# Patient Record
Sex: Male | Born: 1935
Health system: Southern US, Community
[De-identification: ages and names within clinical notes are randomized; demographics above are authoritative.]

## PROBLEM LIST (undated history)

## (undated) DIAGNOSIS — N200 Calculus of kidney: Secondary | ICD-10-CM

## (undated) DIAGNOSIS — IMO0002 Reserved for concepts with insufficient information to code with codable children: Secondary | ICD-10-CM

## (undated) DIAGNOSIS — J309 Allergic rhinitis, unspecified: Secondary | ICD-10-CM

## (undated) DIAGNOSIS — I498 Other specified cardiac arrhythmias: Secondary | ICD-10-CM

## (undated) DIAGNOSIS — J449 Chronic obstructive pulmonary disease, unspecified: Secondary | ICD-10-CM

## (undated) DIAGNOSIS — R42 Dizziness and giddiness: Secondary | ICD-10-CM

## (undated) DIAGNOSIS — E739 Lactose intolerance, unspecified: Secondary | ICD-10-CM

## (undated) DIAGNOSIS — I4891 Unspecified atrial fibrillation: Secondary | ICD-10-CM

## (undated) DIAGNOSIS — E785 Hyperlipidemia, unspecified: Secondary | ICD-10-CM

## (undated) DIAGNOSIS — K5730 Diverticulosis of large intestine without perforation or abscess without bleeding: Secondary | ICD-10-CM

## (undated) DIAGNOSIS — Z5189 Encounter for other specified aftercare: Secondary | ICD-10-CM

## (undated) DIAGNOSIS — E119 Type 2 diabetes mellitus without complications: Secondary | ICD-10-CM

## (undated) DIAGNOSIS — N400 Enlarged prostate without lower urinary tract symptoms: Secondary | ICD-10-CM

## (undated) DIAGNOSIS — Z87898 Personal history of other specified conditions: Secondary | ICD-10-CM

## (undated) DIAGNOSIS — I442 Atrioventricular block, complete: Secondary | ICD-10-CM

## (undated) DIAGNOSIS — M25519 Pain in unspecified shoulder: Secondary | ICD-10-CM

## (undated) DIAGNOSIS — F329 Major depressive disorder, single episode, unspecified: Secondary | ICD-10-CM

## (undated) DIAGNOSIS — J189 Pneumonia, unspecified organism: Secondary | ICD-10-CM

## (undated) DIAGNOSIS — R05 Cough: Secondary | ICD-10-CM

## (undated) DIAGNOSIS — I739 Peripheral vascular disease, unspecified: Secondary | ICD-10-CM

## (undated) DIAGNOSIS — A318 Other mycobacterial infections: Secondary | ICD-10-CM

## (undated) DIAGNOSIS — I6529 Occlusion and stenosis of unspecified carotid artery: Secondary | ICD-10-CM

## (undated) DIAGNOSIS — Z86010 Personal history of colonic polyps: Secondary | ICD-10-CM

## (undated) DIAGNOSIS — I10 Essential (primary) hypertension: Secondary | ICD-10-CM

## (undated) HISTORY — DX: Allergic rhinitis, unspecified: J30.9

## (undated) HISTORY — PX: OTHER SURGICAL HISTORY: SHX169

## (undated) HISTORY — DX: Type 2 diabetes mellitus without complications: E11.9

## (undated) HISTORY — PX: APPENDECTOMY: SHX54

## (undated) HISTORY — DX: Reserved for concepts with insufficient information to code with codable children: IMO0002

## (undated) HISTORY — DX: Major depressive disorder, single episode, unspecified: F32.9

## (undated) HISTORY — DX: Encounter for other specified aftercare: Z51.89

## (undated) HISTORY — DX: Benign prostatic hyperplasia without lower urinary tract symptoms: N40.0

## (undated) HISTORY — DX: Chronic obstructive pulmonary disease, unspecified: J44.9

## (undated) HISTORY — DX: Personal history of other specified conditions: Z87.898

## (undated) HISTORY — DX: Unspecified atrial fibrillation: I48.91

## (undated) HISTORY — DX: Cough: R05

## (undated) HISTORY — DX: Pneumonia, unspecified organism: J18.9

## (undated) HISTORY — PX: HEMORRHOID BANDING: SHX5850

## (undated) HISTORY — DX: Other specified cardiac arrhythmias: I49.8

## (undated) HISTORY — DX: Occlusion and stenosis of unspecified carotid artery: I65.29

## (undated) HISTORY — DX: Hyperlipidemia, unspecified: E78.5

## (undated) HISTORY — DX: Diverticulosis of large intestine without perforation or abscess without bleeding: K57.30

## (undated) HISTORY — DX: Essential (primary) hypertension: I10

## (undated) HISTORY — DX: Lactose intolerance, unspecified: E73.9

## (undated) HISTORY — DX: Personal history of colonic polyps: Z86.010

## (undated) HISTORY — DX: Dizziness and giddiness: R42

## (undated) HISTORY — PX: COLONOSCOPY: SHX174

## (undated) HISTORY — DX: Other mycobacterial infections: A31.8

## (undated) HISTORY — PX: CARPAL TUNNEL RELEASE: SHX101

## (undated) HISTORY — DX: Pain in unspecified shoulder: M25.519

## (undated) HISTORY — DX: Peripheral vascular disease, unspecified: I73.9

---

## 2000-01-27 ENCOUNTER — Encounter (INDEPENDENT_AMBULATORY_CARE_PROVIDER_SITE_OTHER): Payer: Self-pay

## 2000-01-27 ENCOUNTER — Ambulatory Visit: Admission: RE | Admit: 2000-01-27 | Discharge: 2000-01-27 | Payer: Self-pay | Admitting: Internal Medicine

## 2000-02-14 ENCOUNTER — Ambulatory Visit (HOSPITAL_COMMUNITY): Admission: RE | Admit: 2000-02-14 | Discharge: 2000-02-14 | Payer: Self-pay | Admitting: Internal Medicine

## 2002-05-17 ENCOUNTER — Ambulatory Visit (HOSPITAL_COMMUNITY): Admission: RE | Admit: 2002-05-17 | Discharge: 2002-05-17 | Payer: Self-pay | Admitting: *Deleted

## 2002-05-17 ENCOUNTER — Encounter: Payer: Self-pay | Admitting: *Deleted

## 2004-05-17 ENCOUNTER — Ambulatory Visit: Payer: Self-pay | Admitting: Internal Medicine

## 2004-06-04 ENCOUNTER — Ambulatory Visit: Payer: Self-pay | Admitting: Gastroenterology

## 2004-06-07 ENCOUNTER — Ambulatory Visit: Payer: Self-pay | Admitting: Gastroenterology

## 2004-06-07 HISTORY — PX: ESOPHAGOGASTRODUODENOSCOPY: SHX1529

## 2004-10-05 ENCOUNTER — Ambulatory Visit: Payer: Self-pay | Admitting: Internal Medicine

## 2005-02-15 ENCOUNTER — Encounter: Admission: RE | Admit: 2005-02-15 | Discharge: 2005-02-15 | Payer: Self-pay | Admitting: Orthopedic Surgery

## 2005-02-16 ENCOUNTER — Ambulatory Visit (HOSPITAL_COMMUNITY): Admission: RE | Admit: 2005-02-16 | Discharge: 2005-02-16 | Payer: Self-pay | Admitting: Orthopedic Surgery

## 2005-02-16 ENCOUNTER — Ambulatory Visit (HOSPITAL_BASED_OUTPATIENT_CLINIC_OR_DEPARTMENT_OTHER): Admission: RE | Admit: 2005-02-16 | Discharge: 2005-02-16 | Payer: Self-pay | Admitting: Orthopedic Surgery

## 2005-03-01 ENCOUNTER — Ambulatory Visit: Payer: Self-pay | Admitting: Internal Medicine

## 2005-03-16 ENCOUNTER — Ambulatory Visit (HOSPITAL_BASED_OUTPATIENT_CLINIC_OR_DEPARTMENT_OTHER): Admission: RE | Admit: 2005-03-16 | Discharge: 2005-03-16 | Payer: Self-pay | Admitting: Orthopedic Surgery

## 2005-03-16 ENCOUNTER — Ambulatory Visit (HOSPITAL_COMMUNITY): Admission: RE | Admit: 2005-03-16 | Discharge: 2005-03-16 | Payer: Self-pay | Admitting: Orthopedic Surgery

## 2005-09-13 ENCOUNTER — Ambulatory Visit: Payer: Self-pay | Admitting: Internal Medicine

## 2005-11-11 ENCOUNTER — Ambulatory Visit: Payer: Self-pay | Admitting: Internal Medicine

## 2006-04-07 ENCOUNTER — Ambulatory Visit: Payer: Self-pay | Admitting: Internal Medicine

## 2006-04-14 ENCOUNTER — Ambulatory Visit: Payer: Self-pay

## 2006-04-14 ENCOUNTER — Ambulatory Visit: Payer: Self-pay | Admitting: Cardiovascular Disease

## 2006-04-26 ENCOUNTER — Ambulatory Visit: Payer: Self-pay | Admitting: Cardiology

## 2006-04-29 ENCOUNTER — Encounter: Admission: RE | Admit: 2006-04-29 | Discharge: 2006-04-29 | Payer: Self-pay | Admitting: Cardiology

## 2006-07-17 ENCOUNTER — Inpatient Hospital Stay (HOSPITAL_COMMUNITY): Admission: RE | Admit: 2006-07-17 | Discharge: 2006-07-18 | Payer: Self-pay | Admitting: *Deleted

## 2006-07-17 ENCOUNTER — Ambulatory Visit: Payer: Self-pay | Admitting: *Deleted

## 2006-07-17 ENCOUNTER — Encounter (INDEPENDENT_AMBULATORY_CARE_PROVIDER_SITE_OTHER): Payer: Self-pay | Admitting: Specialist

## 2006-09-28 HISTORY — PX: CAROTID ENDARTERECTOMY: SUR193

## 2007-02-06 ENCOUNTER — Ambulatory Visit: Payer: Self-pay | Admitting: Vascular Surgery

## 2007-03-27 DIAGNOSIS — I10 Essential (primary) hypertension: Secondary | ICD-10-CM | POA: Insufficient documentation

## 2007-03-27 DIAGNOSIS — I739 Peripheral vascular disease, unspecified: Secondary | ICD-10-CM

## 2007-03-27 DIAGNOSIS — E785 Hyperlipidemia, unspecified: Secondary | ICD-10-CM

## 2007-03-27 HISTORY — DX: Essential (primary) hypertension: I10

## 2007-03-27 HISTORY — DX: Hyperlipidemia, unspecified: E78.5

## 2007-03-27 HISTORY — DX: Peripheral vascular disease, unspecified: I73.9

## 2007-04-03 ENCOUNTER — Ambulatory Visit: Payer: Self-pay | Admitting: Internal Medicine

## 2007-04-04 LAB — CONVERTED CEMR LAB
Albumin: 4.2 g/dL (ref 3.5–5.2)
Basophils Absolute: 0 10*3/uL (ref 0.0–0.1)
Bilirubin Urine: NEGATIVE
Bilirubin, Direct: 0.3 mg/dL (ref 0.0–0.3)
Creatinine, Ser: 1.4 mg/dL (ref 0.4–1.5)
Eosinophils Absolute: 0.2 10*3/uL (ref 0.0–0.6)
GFR calc non Af Amer: 53 mL/min
Glucose, Bld: 113 mg/dL — ABNORMAL HIGH (ref 70–99)
Ketones, ur: NEGATIVE mg/dL
MCHC: 34.7 g/dL (ref 30.0–36.0)
MCV: 89.2 fL (ref 78.0–100.0)
Monocytes Absolute: 0.8 10*3/uL — ABNORMAL HIGH (ref 0.2–0.7)
Neutrophils Relative %: 61.2 % (ref 43.0–77.0)
Nitrite: NEGATIVE
PSA: 0.56 ng/mL (ref 0.10–4.00)
Potassium: 4.5 meq/L (ref 3.5–5.1)
Sodium: 138 meq/L (ref 135–145)
Specific Gravity, Urine: 1.025 (ref 1.000–1.03)
TSH: 2.51 microintl units/mL (ref 0.35–5.50)
Total Bilirubin: 1 mg/dL (ref 0.3–1.2)
Total CHOL/HDL Ratio: 3.9
Total Protein, Urine: NEGATIVE mg/dL
Triglycerides: 90 mg/dL (ref 0–149)
Urine Glucose: NEGATIVE mg/dL
VLDL: 18 mg/dL (ref 0–40)
WBC: 7.5 10*3/uL (ref 4.5–10.5)
pH: 5.5 (ref 5.0–8.0)

## 2007-04-09 ENCOUNTER — Ambulatory Visit: Payer: Self-pay | Admitting: Internal Medicine

## 2007-04-09 DIAGNOSIS — J309 Allergic rhinitis, unspecified: Secondary | ICD-10-CM | POA: Insufficient documentation

## 2007-04-09 DIAGNOSIS — E739 Lactose intolerance, unspecified: Secondary | ICD-10-CM | POA: Insufficient documentation

## 2007-04-09 DIAGNOSIS — F329 Major depressive disorder, single episode, unspecified: Secondary | ICD-10-CM

## 2007-04-09 DIAGNOSIS — Z87898 Personal history of other specified conditions: Secondary | ICD-10-CM

## 2007-04-09 DIAGNOSIS — F32A Depression, unspecified: Secondary | ICD-10-CM | POA: Insufficient documentation

## 2007-04-09 DIAGNOSIS — Z860100 Personal history of colon polyps, unspecified: Secondary | ICD-10-CM

## 2007-04-09 DIAGNOSIS — K5730 Diverticulosis of large intestine without perforation or abscess without bleeding: Secondary | ICD-10-CM | POA: Insufficient documentation

## 2007-04-09 DIAGNOSIS — A318 Other mycobacterial infections: Secondary | ICD-10-CM | POA: Insufficient documentation

## 2007-04-09 DIAGNOSIS — J449 Chronic obstructive pulmonary disease, unspecified: Secondary | ICD-10-CM

## 2007-04-09 DIAGNOSIS — N400 Enlarged prostate without lower urinary tract symptoms: Secondary | ICD-10-CM | POA: Insufficient documentation

## 2007-04-09 DIAGNOSIS — J4489 Other specified chronic obstructive pulmonary disease: Secondary | ICD-10-CM

## 2007-04-09 DIAGNOSIS — F3289 Other specified depressive episodes: Secondary | ICD-10-CM

## 2007-04-09 HISTORY — DX: Allergic rhinitis, unspecified: J30.9

## 2007-04-09 HISTORY — DX: Diverticulosis of large intestine without perforation or abscess without bleeding: K57.30

## 2007-04-09 HISTORY — DX: Personal history of other specified conditions: Z87.898

## 2007-04-09 HISTORY — DX: Benign prostatic hyperplasia without lower urinary tract symptoms: N40.0

## 2007-04-09 HISTORY — DX: Other mycobacterial infections: A31.8

## 2007-04-09 HISTORY — DX: Personal history of colon polyps, unspecified: Z86.0100

## 2007-04-09 HISTORY — DX: Other specified depressive episodes: F32.89

## 2007-04-09 HISTORY — DX: Major depressive disorder, single episode, unspecified: F32.9

## 2007-04-09 HISTORY — DX: Other specified chronic obstructive pulmonary disease: J44.89

## 2007-04-09 HISTORY — DX: Chronic obstructive pulmonary disease, unspecified: J44.9

## 2007-04-09 HISTORY — DX: Personal history of colonic polyps: Z86.010

## 2007-04-09 HISTORY — DX: Lactose intolerance, unspecified: E73.9

## 2007-04-24 ENCOUNTER — Ambulatory Visit: Payer: Self-pay | Admitting: Gastroenterology

## 2007-05-10 ENCOUNTER — Encounter: Payer: Self-pay | Admitting: Internal Medicine

## 2007-05-10 ENCOUNTER — Encounter: Payer: Self-pay | Admitting: Gastroenterology

## 2007-05-10 ENCOUNTER — Ambulatory Visit: Payer: Self-pay | Admitting: Gastroenterology

## 2007-05-10 LAB — HM COLONOSCOPY: HM Colonoscopy: ABNORMAL

## 2007-08-01 ENCOUNTER — Ambulatory Visit: Payer: Self-pay | Admitting: Internal Medicine

## 2007-08-01 DIAGNOSIS — M25519 Pain in unspecified shoulder: Secondary | ICD-10-CM

## 2007-08-01 HISTORY — DX: Pain in unspecified shoulder: M25.519

## 2007-08-21 ENCOUNTER — Ambulatory Visit: Payer: Self-pay | Admitting: Vascular Surgery

## 2007-10-02 ENCOUNTER — Ambulatory Visit: Payer: Self-pay | Admitting: Internal Medicine

## 2007-10-03 LAB — CONVERTED CEMR LAB
ALT: 22 units/L (ref 0–53)
AST: 26 units/L (ref 0–37)
Albumin: 4 g/dL (ref 3.5–5.2)
Alkaline Phosphatase: 68 units/L (ref 39–117)
BUN: 19 mg/dL (ref 6–23)
Bilirubin, Direct: 0.1 mg/dL (ref 0.0–0.3)
CO2: 26 meq/L (ref 19–32)
GFR calc Af Amer: 69 mL/min
Glucose, Bld: 115 mg/dL — ABNORMAL HIGH (ref 70–99)
HDL: 27.1 mg/dL — ABNORMAL LOW (ref >39.0)
Potassium: 4 meq/L (ref 3.5–5.1)
Total Protein: 7.3 g/dL (ref 6.0–8.3)

## 2007-10-09 ENCOUNTER — Ambulatory Visit: Payer: Self-pay | Admitting: Internal Medicine

## 2007-10-09 DIAGNOSIS — E119 Type 2 diabetes mellitus without complications: Secondary | ICD-10-CM | POA: Insufficient documentation

## 2007-10-09 HISTORY — DX: Type 2 diabetes mellitus without complications: E11.9

## 2007-10-29 ENCOUNTER — Telehealth: Payer: Self-pay | Admitting: Internal Medicine

## 2007-10-31 ENCOUNTER — Encounter: Payer: Self-pay | Admitting: Internal Medicine

## 2007-10-31 ENCOUNTER — Ambulatory Visit: Payer: Self-pay | Admitting: Internal Medicine

## 2007-11-08 ENCOUNTER — Ambulatory Visit: Payer: Self-pay | Admitting: Internal Medicine

## 2007-11-08 DIAGNOSIS — J189 Pneumonia, unspecified organism: Secondary | ICD-10-CM | POA: Insufficient documentation

## 2007-11-08 HISTORY — DX: Pneumonia, unspecified organism: J18.9

## 2007-12-10 ENCOUNTER — Ambulatory Visit: Payer: Self-pay | Admitting: Internal Medicine

## 2008-03-03 ENCOUNTER — Telehealth (INDEPENDENT_AMBULATORY_CARE_PROVIDER_SITE_OTHER): Payer: Self-pay | Admitting: *Deleted

## 2008-03-27 ENCOUNTER — Ambulatory Visit: Payer: Self-pay | Admitting: Internal Medicine

## 2008-03-27 DIAGNOSIS — R059 Cough, unspecified: Secondary | ICD-10-CM

## 2008-03-27 DIAGNOSIS — R05 Cough: Secondary | ICD-10-CM

## 2008-03-27 HISTORY — DX: Cough, unspecified: R05.9

## 2008-03-28 ENCOUNTER — Telehealth: Payer: Self-pay | Admitting: Internal Medicine

## 2008-04-01 ENCOUNTER — Ambulatory Visit: Payer: Self-pay | Admitting: Internal Medicine

## 2008-04-01 LAB — CONVERTED CEMR LAB
ALT: 23 units/L (ref 0–53)
AST: 22 units/L (ref 0–37)
Alkaline Phosphatase: 62 units/L (ref 39–117)
BUN: 19 mg/dL (ref 6–23)
Basophils Absolute: 0 10*3/uL (ref 0.0–0.1)
Basophils Relative: 0.5 % (ref 0.0–3.0)
CO2: 27 meq/L (ref 19–32)
Chloride: 105 meq/L (ref 96–112)
Cholesterol: 146 mg/dL (ref 0–200)
Creatinine, Ser: 1.3 mg/dL (ref 0.4–1.5)
Creatinine,U: 115.9 mg/dL
Eosinophils Absolute: 0.3 10*3/uL (ref 0.0–0.7)
Eosinophils Relative: 5 % (ref 0.0–5.0)
GFR calc non Af Amer: 57 mL/min
Hemoglobin, Urine: NEGATIVE
Hgb A1c MFr Bld: 6.5 % — ABNORMAL HIGH (ref 4.6–6.0)
LDL Cholesterol: 88 mg/dL (ref 0–99)
Leukocytes, UA: NEGATIVE
Lymphocytes Relative: 26.2 % (ref 12.0–46.0)
MCV: 90.7 fL (ref 78.0–100.0)
Neutrophils Relative %: 55.2 % (ref 43.0–77.0)
PSA: 0.52 ng/mL (ref 0.10–4.00)
Platelets: 181 10*3/uL (ref 150–400)
Potassium: 4.1 meq/L (ref 3.5–5.1)
RBC: 4.71 M/uL (ref 4.22–5.81)
Specific Gravity, Urine: 1.02 (ref 1.000–1.03)
Total Bilirubin: 0.8 mg/dL (ref 0.3–1.2)
Total CHOL/HDL Ratio: 4.5
Urobilinogen, UA: 0.2 (ref 0.0–1.0)
VLDL: 26 mg/dL (ref 0–40)
WBC: 5.2 10*3/uL (ref 4.5–10.5)

## 2008-04-07 ENCOUNTER — Ambulatory Visit: Payer: Self-pay | Admitting: Internal Medicine

## 2008-04-11 ENCOUNTER — Telehealth (INDEPENDENT_AMBULATORY_CARE_PROVIDER_SITE_OTHER): Payer: Self-pay | Admitting: *Deleted

## 2008-05-05 ENCOUNTER — Ambulatory Visit: Payer: Self-pay | Admitting: Internal Medicine

## 2008-07-22 ENCOUNTER — Telehealth: Payer: Self-pay | Admitting: Internal Medicine

## 2008-07-28 ENCOUNTER — Telehealth: Payer: Self-pay | Admitting: Internal Medicine

## 2008-08-12 ENCOUNTER — Ambulatory Visit: Payer: Self-pay | Admitting: Vascular Surgery

## 2008-10-01 ENCOUNTER — Ambulatory Visit: Payer: Self-pay | Admitting: Internal Medicine

## 2008-10-01 LAB — CONVERTED CEMR LAB
Chloride: 109 meq/L (ref 96–112)
Cholesterol: 112 mg/dL (ref 0–200)
GFR calc non Af Amer: 69.06 mL/min (ref >60)
HDL: 28.5 mg/dL — ABNORMAL LOW (ref >39.00)
LDL Cholesterol: 63 mg/dL (ref 0–99)
Potassium: 4.2 meq/L (ref 3.5–5.1)
Sodium: 141 meq/L (ref 135–145)
Triglycerides: 101 mg/dL (ref 0.0–149.0)
VLDL: 20.2 mg/dL (ref 0.0–40.0)

## 2008-10-06 ENCOUNTER — Ambulatory Visit: Payer: Self-pay | Admitting: Internal Medicine

## 2008-10-06 DIAGNOSIS — R42 Dizziness and giddiness: Secondary | ICD-10-CM

## 2008-10-06 HISTORY — DX: Dizziness and giddiness: R42

## 2008-10-16 ENCOUNTER — Encounter: Payer: Self-pay | Admitting: Internal Medicine

## 2008-10-16 ENCOUNTER — Ambulatory Visit: Payer: Self-pay

## 2009-04-01 ENCOUNTER — Ambulatory Visit: Payer: Self-pay | Admitting: Internal Medicine

## 2009-04-01 LAB — CONVERTED CEMR LAB
AST: 23 units/L (ref 0–37)
Albumin: 4.4 g/dL (ref 3.5–5.2)
Alkaline Phosphatase: 61 units/L (ref 39–117)
BUN: 16 mg/dL (ref 6–23)
Basophils Relative: 0.6 % (ref 0.0–3.0)
CO2: 28 meq/L (ref 19–32)
Calcium: 9 mg/dL (ref 8.4–10.5)
Cholesterol: 156 mg/dL (ref 0–200)
Eosinophils Relative: 5.1 % — ABNORMAL HIGH (ref 0.0–5.0)
Glucose, Bld: 122 mg/dL — ABNORMAL HIGH (ref 70–99)
HCT: 46 % (ref 39.0–52.0)
Hemoglobin: 15.7 g/dL (ref 13.0–17.0)
LDL Cholesterol: 93 mg/dL (ref 0–99)
Leukocytes, UA: NEGATIVE
Lymphs Abs: 1.4 10*3/uL (ref 0.7–4.0)
MCV: 94.2 fL (ref 78.0–100.0)
Microalb Creat Ratio: 102.3 mg/g — ABNORMAL HIGH (ref 0.0–30.0)
Monocytes Relative: 10.6 % (ref 3.0–12.0)
Neutro Abs: 3.6 10*3/uL (ref 1.4–7.7)
PSA: 0.69 ng/mL (ref 0.10–4.00)
Platelets: 159 10*3/uL (ref 150.0–400.0)
RBC: 4.88 M/uL (ref 4.22–5.81)
Sodium: 139 meq/L (ref 135–145)
Specific Gravity, Urine: 1.015 (ref 1.000–1.030)
Total CHOL/HDL Ratio: 4
Total Protein: 8.1 g/dL (ref 6.0–8.3)
Urobilinogen, UA: 0.2 (ref 0.0–1.0)
WBC: 5.9 10*3/uL (ref 4.5–10.5)

## 2009-04-08 ENCOUNTER — Ambulatory Visit: Payer: Self-pay | Admitting: Internal Medicine

## 2009-04-20 ENCOUNTER — Telehealth: Payer: Self-pay | Admitting: Internal Medicine

## 2009-05-11 ENCOUNTER — Ambulatory Visit: Payer: Self-pay | Admitting: Internal Medicine

## 2009-05-11 DIAGNOSIS — I498 Other specified cardiac arrhythmias: Secondary | ICD-10-CM | POA: Insufficient documentation

## 2009-05-11 HISTORY — DX: Other specified cardiac arrhythmias: I49.8

## 2009-05-18 ENCOUNTER — Telehealth: Payer: Self-pay | Admitting: Internal Medicine

## 2009-08-13 ENCOUNTER — Ambulatory Visit: Payer: Self-pay | Admitting: Vascular Surgery

## 2009-11-04 ENCOUNTER — Ambulatory Visit: Payer: Self-pay | Admitting: Internal Medicine

## 2009-11-04 LAB — CONVERTED CEMR LAB
Alkaline Phosphatase: 56 units/L (ref 39–117)
Bilirubin, Direct: 0.1 mg/dL (ref 0.0–0.3)
LDL Cholesterol: 84 mg/dL (ref 0–99)
Total Bilirubin: 0.6 mg/dL (ref 0.3–1.2)
Total CHOL/HDL Ratio: 4

## 2009-11-11 ENCOUNTER — Ambulatory Visit: Payer: Self-pay | Admitting: Internal Medicine

## 2010-03-17 ENCOUNTER — Ambulatory Visit: Payer: Self-pay | Admitting: Internal Medicine

## 2010-05-06 ENCOUNTER — Ambulatory Visit: Payer: Self-pay | Admitting: Internal Medicine

## 2010-05-07 LAB — CONVERTED CEMR LAB
Alkaline Phosphatase: 60 units/L (ref 39–117)
Basophils Relative: 0.3 % (ref 0.0–3.0)
Bilirubin, Direct: 0.1 mg/dL (ref 0.0–0.3)
CO2: 28 meq/L (ref 19–32)
Calcium: 8.7 mg/dL (ref 8.4–10.5)
Creatinine, Ser: 1.2 mg/dL (ref 0.4–1.5)
Creatinine,U: 212.9 mg/dL
Eosinophils Absolute: 0.2 10*3/uL (ref 0.0–0.7)
HDL: 31.2 mg/dL — ABNORMAL LOW (ref >39.00)
Hgb A1c MFr Bld: 6.7 % — ABNORMAL HIGH (ref 4.6–6.5)
LDL Cholesterol: 79 mg/dL (ref 0–99)
Lymphocytes Relative: 25.4 % (ref 12.0–46.0)
MCHC: 34.2 g/dL (ref 30.0–36.0)
Microalb Creat Ratio: 2.8 mg/g (ref 0.0–30.0)
Microalb, Ur: 6 mg/dL — ABNORMAL HIGH (ref 0.0–1.9)
Neutrophils Relative %: 58.5 % (ref 43.0–77.0)
PSA: 0.64 ng/mL (ref 0.10–4.00)
RBC: 4.67 M/uL (ref 4.22–5.81)
Specific Gravity, Urine: >=1.03 (ref 1.000–1.030)
Total Bilirubin: 0.5 mg/dL (ref 0.3–1.2)
Total CHOL/HDL Ratio: 4
Total Protein, Urine: NEGATIVE mg/dL
Total Protein: 7.4 g/dL (ref 6.0–8.3)
Triglycerides: 149 mg/dL (ref 0.0–149.0)
Urine Glucose: NEGATIVE mg/dL
WBC: 6.2 10*3/uL (ref 4.5–10.5)
pH: 5 (ref 5.0–8.0)

## 2010-05-13 ENCOUNTER — Ambulatory Visit: Payer: Self-pay | Admitting: Internal Medicine

## 2010-06-24 ENCOUNTER — Telehealth: Payer: Self-pay | Admitting: Internal Medicine

## 2010-06-29 NOTE — Assessment & Plan Note (Signed)
Summary: 6 MO ROV/NWS  #   Vital Signs:  Patient profile:   77 year old male Height:      72 inches Weight:      210 pounds BMI:     28.58 O2 Sat:      94 % on Room air Temp:     97.5 degrees F oral Pulse rate:   56 / minute BP sitting:   138 / 92  (left arm) Cuff size:   regular  Vitals Entered ByZella Ball Ewing (November 11, 2009 11:32 AM)  O2 Flow:  Room air  CC: 6 month ROV/RE   Primary Care Provider:  Willeen Cass  CC:  6 month ROV/RE.  History of Present Illness: overall doing well, no significant new complaints.  Pt denies CP, sob, doe, wheezing, orthopnea, pnd, worsening LE edema, palps, dizziness or syncope  Pt denies new neuro symptoms such as headache, facial or extremity weakness   Pt denies polydipsia, polyuria, or low sugar symptoms such as shakiness improved with eating.  Overall good compliance with meds, trying to follow low chol, DM diet, wt stable, little excercise however   Overall good compliance with meds, good tolerability.  Denies worsening depresive symtpoms or suicidal ideation.  No recent fever, wt loss, night sweats or other constitutional symtpoms.    Preventive Screening-Counseling & Management      Drug Use:  no.    Problems Prior to Update: 1)  Bradycardia  (ICD-427.89) 2)  Dizziness  (ICD-780.4) 3)  Preventive Health Care  (ICD-V70.0) 4)  Cough  (ICD-786.2) 5)  Pneumonia Organism Nos  (ICD-486) 6)  Diabetes Mellitus, Type II  (ICD-250.00) 7)  Shoulder Pain, Bilateral  (ICD-719.41) 8)  Preventive Health Care  (ICD-V70.0) 9)  Bacteremia, Mycobacterium Avium Complex  (ICD-031.2) 10)  Allergic Rhinitis  (ICD-477.9) 11)  Shingles, Hx of  (ICD-V13.8) 12)  Depression  (ICD-311) 13)  Benign Prostatic Hypertrophy  (ICD-600.00) 14)  COPD  (ICD-496) 15)  Glucose Intolerance  (ICD-271.3) 16)  Diverticulosis, Colon  (ICD-562.10) 17)  Colonic Polyps, Hx of  (ICD-V12.72) 18)  Special Screening Malignant Neoplasm of Prostate  (ICD-V76.44) 19)  Peripheral  Vascular Disease  (ICD-443.9) 20)  Hypertension  (ICD-401.9) 21)  Hyperlipidemia  (ICD-272.4)  Medications Prior to Update: 1)  Omeprazole 20 Mg  Cpdr (Omeprazole) .Marland Kitchen.. 1 By Mouth Two Times A Day 2)  Ecotrin 325 Mg  Tbec (Aspirin) .Marland Kitchen.. 1po Qd 3)  Metamucil .... 2 Caps.qd 4)  Co Q10 .Marland Kitchen.. 200 Mg 1po Qd 5)  Crestor 20 Mg  Tabs (Rosuvastatin Calcium) .Marland Kitchen.. 1 By Mouth Qhs 6)  Amlodipine Besylate 10 Mg Tabs (Amlodipine Besylate) .Marland Kitchen.. 1 Tab By Mouth Daily 7)  Losartan Potassium-Hctz 100-25 Mg Tabs (Losartan Potassium-Hctz) .Marland Kitchen.. 1 By Mouth Once Daily 8)  Toprol Xl 50 Mg Xr24h-Tab (Metoprolol Succinate) .Marland Kitchen.. 1 By Mouth Once Daily  Current Medications (verified): 1)  Omeprazole 20 Mg  Cpdr (Omeprazole) .Marland Kitchen.. 1 By Mouth Two Times A Day 2)  Ecotrin 325 Mg  Tbec (Aspirin) .Marland Kitchen.. 1po Qd 3)  Metamucil .... 2 Caps.qd 4)  Co Q10 .Marland Kitchen.. 200 Mg 1po Qd 5)  Crestor 20 Mg  Tabs (Rosuvastatin Calcium) .Marland Kitchen.. 1 By Mouth Qhs 6)  Amlodipine Besylate 10 Mg Tabs (Amlodipine Besylate) .Marland Kitchen.. 1 Tab By Mouth Daily 7)  Losartan Potassium-Hctz 100-25 Mg Tabs (Losartan Potassium-Hctz) .Marland Kitchen.. 1 By Mouth Once Daily 8)  Toprol Xl 50 Mg Xr24h-Tab (Metoprolol Succinate) .Marland Kitchen.. 1 By Mouth Once Daily  Allergies (verified): 1)  Ace Inhibitors  Past History:  Past Medical History: Last updated: 10/06/2008 Hyperlipidemia Hypertension Peripheral vascular disease Colonic polyps, hx of Diverticulosis, colon Benign prostatic hypertrophy Depression hx of shingles Allergic rhinitis hx of pos pulm MAI 1998 Diabetes mellitus, type II - diet COUGH  onset 6/09.....................................................................Marland KitchenWert    - h/o ACE intol    - D/c cozaar and amlodipine 03/27/08 trial basis    - resumed April 11, 2008 no improvement reported off cozaar/amlodipine Diabetes mellitus, type II  Past Surgical History: Last updated: 04/09/2007 EGD-06/07/2004 Colonoscopy-02/17/2004 Carotid endarterectomy - left -  5/08 Appendectomy s/p PUD surgury - ?partial gastrectomy Carpal tunnel release - bilat  Social History: Last updated: 11/11/2009 Former Smoker quit 2/01 Alcohol use-yes 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   Head:  normocephalic and atraumatic.   Eyes:  vision grossly intact, pupils equal, and pupils round.   Ears:  R ear normal and L ear normal.   Nose:  no external deformity and no nasal discharge.   Mouth:  no gingival abnormalities and pharynx pink and moist.   Neck:  supple and no masses.   Lungs:  normal respiratory effort and normal breath sounds.   Heart:  normal rate and regular rhythm.   Extremities:  no edema, no erythema  Psych:  not anxious appearing and not depressed appearing.     Impression & Recommendations:  Problem # 1:  DIABETES MELLITUS, TYPE II (ICD-250.00)  His updated medication list for this problem includes:    Ecotrin 325 Mg Tbec (Aspirin) .Marland Kitchen... 1po qd    Losartan Potassium-hctz 100-25 Mg Tabs (Losartan potassium-hctz) .Marland Kitchen... 1 by mouth once daily  Labs Reviewed: Creat: 1.3 (04/01/2009)    Reviewed HgBA1c results: 6.6 (11/04/2009)  6.2 (04/01/2009) stable overall by hx and exam, ok to continue meds/tx as is  - no OHA needed at this time;  Pt to cont DM diet, excercise, wt loss efforts  Problem # 2:  HYPERTENSION (ICD-401.9)  His updated medication list for this problem includes:    Amlodipine Besylate 10 Mg Tabs (Amlodipine besylate) .Marland Kitchen... 1 tab by mouth daily    Losartan Potassium-hctz 100-25 Mg Tabs (Losartan  potassium-hctz) .Marland Kitchen... 1 by mouth once daily    Toprol Xl 50 Mg Xr24h-tab (Metoprolol succinate) .Marland Kitchen... 1 by mouth once daily  BP today: 138/92 Prior BP: 152/82 (05/11/2009)  Labs Reviewed: K+: 4.3 (04/01/2009) Creat: : 1.3 (04/01/2009)   Chol: 134 (11/04/2009)   HDL: 33.20 (11/04/2009)   LDL: 84 (11/04/2009)   TG: 86.0 (11/04/2009) stable overall by hx and exam, ok to continue meds/tx as is   Problem # 3:  HYPERLIPIDEMIA (ICD-272.4)  His updated medication list for this problem includes:    Crestor 20 Mg Tabs (Rosuvastatin calcium) .Marland Kitchen... 1 by mouth qhs  Labs Reviewed: SGOT: 26 (11/04/2009)   SGPT: 23 (11/04/2009)   HDL:33.20 (11/04/2009), 35.70 (04/01/2009)  LDL:84 (11/04/2009), 93 (04/01/2009)  Chol:134 (11/04/2009), 156 (04/01/2009)  Trig:86.0 (11/04/2009), 139.0 (04/01/2009) stable overall by hx and exam, ok to continue meds/tx as is   Problem # 4:  DEPRESSION (ICD-311) stable overall by  hx and exam, ok to continue meds/tx as is  - none needed at this time  Complete Medication List: 1)  Omeprazole 20 Mg Cpdr (Omeprazole) .Marland Kitchen.. 1 by mouth two times a day 2)  Ecotrin 325 Mg Tbec (Aspirin) .Marland Kitchen.. 1po qd 3)  Metamucil  .... 2 caps.qd 4)  Co Q10  .Marland Kitchen.. 200 mg 1po qd 5)  Crestor 20 Mg Tabs (Rosuvastatin calcium) .Marland Kitchen.. 1 by mouth qhs 6)  Amlodipine Besylate 10 Mg Tabs (Amlodipine besylate) .Marland Kitchen.. 1 tab by mouth daily 7)  Losartan Potassium-hctz 100-25 Mg Tabs (Losartan potassium-hctz) .Marland Kitchen.. 1 by mouth once daily 8)  Toprol Xl 50 Mg Xr24h-tab (Metoprolol succinate) .Marland Kitchen.. 1 by mouth once daily  Patient Instructions: 1)  Continue all previous medications as before this visit  2)  Please schedule a follow-up appointment in 6 months with CPX labs and: 3)  HbgA1C prior to visit, ICD-9: 250.02 4)  Urine Microalbumin prior to visit, ICD-9:

## 2010-06-29 NOTE — Assessment & Plan Note (Signed)
Summary: flu shot-lb   Nurse Visit   Allergies: 1)  Ace Inhibitors  Orders Added: 1)  Flu Vaccine 37yrs + MEDICARE PATIENTS [Q2039] 2)  Administration Flu vaccine - MCR [G0008] Flu Vaccine Consent Questions     Do you have a history of severe allergic reactions to this vaccine? no    Any prior history of allergic reactions to egg and/or gelatin? no    Do you have a sensitivity to the preservative Thimersol? no    Do you have a past history of Guillan-Barre Syndrome? no    Do you currently have an acute febrile illness? no    Have you ever had a severe reaction to latex? no    Vaccine information given and explained to patient? yes    Are you currently pregnant? no    Lot Number:[id number]   Exp Date:11/27/2010   Site Given  Left Deltoid IM Lanier Prude, [facility])  March 17, 2010 9:57 AM

## 2010-07-01 NOTE — Progress Notes (Signed)
Summary: Pharmacy change  Phone Note Refill Request Message from:  Patient on June 24, 2010 1:44 PM  Refills Requested: Medication #1:  OMEPRAZOLE 20 MG  CPDR 1 by mouth two times a day   Dosage confirmed as above?Dosage Confirmed   Supply Requested: 6 months  Medication #2:  CRESTOR 20 MG  TABS 1 by mouth qhs   Dosage confirmed as above?Dosage Confirmed   Supply Requested: 6 months  Medication #3:  AMLODIPINE BESYLATE 10 MG TABS 1 TAB BY MOUTH DAILY   Dosage confirmed as above?Dosage Confirmed   Supply Requested: 6 months  Medication #4:  LOSARTAN POTASSIUM-HCTZ 100-25 MG TABS 1 by mouth once daily   Dosage confirmed as above?Dosage Confirmed   Supply Requested: 6 months Pt changed pharmacies from [facility] to [facility]   Method Requested: Electronic Initial call taken by: Margaret Pyle, CMA,  June 24, 2010 1:45 PM    Prescriptions: LOSARTAN POTASSIUM-HCTZ 100-25 MG TABS (LOSARTAN POTASSIUM-HCTZ) 1 by mouth once daily  #90 x 1   Entered by:   Margaret Pyle, CMA   Authorized by:   [name] MD   Signed by:   Margaret Pyle, CMA on 06/24/2010   Method used:   Electronically to        [facility]* (retail)       [address] [place], [place]  [address]       Ph: [phone]       Fax: [phone]   RxID:   [id number] TOPROL XL 50 MG XR24H-TAB (METOPROLOL SUCCINATE) 1 by mouth once daily  #90 x 1   Entered by:   Margaret Pyle, CMA   Authorized by:   [name] MD   Signed by:   Margaret Pyle, CMA on 06/24/2010   Method used:   Electronically to        [facility]* (retail)       [address] [place], [place]  [address]       Ph: [phone]       Fax: [phone]   RxID:   [id number] AMLODIPINE BESYLATE 10 MG TABS (AMLODIPINE BESYLATE) 1 TAB BY MOUTH DAILY  #90 x 1   Entered by:   Margaret Pyle, CMA  Authorized by:   [name] MD   Signed by:   Margaret Pyle, CMA on 06/24/2010   Method used:   Electronically to        [facility]* (retail)       [address] [place], [place]  [address]       Ph: [phone]       Fax: [phone]   RxID:   [id number] CRESTOR 20 MG  TABS (ROSUVASTATIN CALCIUM) 1 by mouth qhs  #90 x 1   Entered by:   Margaret Pyle, CMA   Authorized by:   [name] MD   Signed by:   Margaret Pyle, CMA on 06/24/2010   Method used:   Electronically to        [facility]* (retail)       [address] [place], [place]  [address]  Ph: [phone]       Fax: [phone]   RxID:   [id number] OMEPRAZOLE 20 MG  CPDR (OMEPRAZOLE) 1 by mouth two times a day  #180 x 1   Entered by:   Margaret Pyle, CMA   Authorized by:   [name] MD   Signed by:   Margaret Pyle, CMA on 06/24/2010   Method used:   Electronically to        [facility]* (retail)       [address] [place], [place]  [address]       Ph: [phone]       Fax: [phone]   RxID:   [id number]

## 2010-07-01 NOTE — Assessment & Plan Note (Signed)
Summary: 6 mos f/u #/cd   Vital Signs:  Patient profile:   77 year old male Height:      73 inches Weight:      211.13 pounds BMI:     27.96 O2 Sat:      94 % on Room air Temp:     97.4 degrees F oral Pulse rate:   65 / minute BP sitting:   120 / 70  (left arm) Cuff size:   large  Vitals Entered By: Zella Ball Ewing CMA (AAMA) (May 13, 2010 11:11 AM)  O2 Flow:  Room air  CC: 6  month ROV/RE   Primary Care Provider:  Willeen Cass  CC:  6  month ROV/RE.  History of Present Illness: here for wellness and f/u;  overall doing well;  Pt denies CP, worsening sob, doe, wheezing, orthopnea, pnd, worsening LE edema, palps, dizziness or syncope  Pt denies new neuro symptoms such as headache, facial or extremity weakness  Pt denies polydipsia, polyuria, or low sugar symptoms such as shakiness improved with eating.  Overall good compliance with meds, trying to follow low chol, DM diet, wt stable, little excercise however  Denies worsening depressive symptoms, suicidal ideation, or panic.   Overall good compliance with meds, and good tolerability.  No fever, wt loss, night sweats, loss of appetite or other constitutional symptoms  Pt states good ability with ADL's, low fall risk, home safety reviewed and adequate, no significant change in hearing or vision, trying to follow lower chol diet, and occasionally active only with regular excercise.   Problems Prior to Update: 1)  Bradycardia  (ICD-427.89) 2)  Dizziness  (ICD-780.4) 3)  Preventive Health Care  (ICD-V70.0) 4)  Cough  (ICD-786.2) 5)  Pneumonia Organism Nos  (ICD-486) 6)  Diabetes Mellitus, Type II  (ICD-250.00) 7)  Shoulder Pain, Bilateral  (ICD-719.41) 8)  Preventive Health Care  (ICD-V70.0) 9)  Bacteremia, Mycobacterium Avium Complex  (ICD-031.2) 10)  Allergic Rhinitis  (ICD-477.9) 11)  Shingles, Hx of  (ICD-V13.8) 12)  Depression  (ICD-311) 13)  Benign Prostatic Hypertrophy  (ICD-600.00) 14)  COPD  (ICD-496) 15)  Glucose  Intolerance  (ICD-271.3) 16)  Diverticulosis, Colon  (ICD-562.10) 17)  Colonic Polyps, Hx of  (ICD-V12.72) 18)  Special Screening Malignant Neoplasm of Prostate  (ICD-V76.44) 19)  Peripheral Vascular Disease  (ICD-443.9) 20)  Hypertension  (ICD-401.9) 21)  Hyperlipidemia  (ICD-272.4)  Medications Prior to Update: 1)  Omeprazole 20 Mg  Cpdr (Omeprazole) .Marland Kitchen.. 1 By Mouth Two Times A Day 2)  Ecotrin 325 Mg  Tbec (Aspirin) .Marland Kitchen.. 1po Qd 3)  Metamucil .... 2 Caps.qd 4)  Co Q10 .Marland Kitchen.. 200 Mg 1po Qd 5)  Crestor 20 Mg  Tabs (Rosuvastatin Calcium) .Marland Kitchen.. 1 By Mouth Qhs 6)  Amlodipine Besylate 10 Mg Tabs (Amlodipine Besylate) .Marland Kitchen.. 1 Tab By Mouth Daily 7)  Losartan Potassium-Hctz 100-25 Mg Tabs (Losartan Potassium-Hctz) .Marland Kitchen.. 1 By Mouth Once Daily 8)  Toprol Xl 50 Mg Xr24h-Tab (Metoprolol Succinate) .Marland Kitchen.. 1 By Mouth Once Daily  Current Medications (verified): 1)  Omeprazole 20 Mg  Cpdr (Omeprazole) .Marland Kitchen.. 1 By Mouth Two Times A Day 2)  Ecotrin 325 Mg  Tbec (Aspirin) .Marland Kitchen.. 1po Qd 3)  Metamucil .... 2 Caps.qd 4)  Co Q10 .Marland Kitchen.. 200 Mg 1po Qd 5)  Crestor 20 Mg  Tabs (Rosuvastatin Calcium) .Marland Kitchen.. 1 By Mouth Qhs 6)  Amlodipine Besylate 10 Mg Tabs (Amlodipine Besylate) .Marland Kitchen.. 1 Tab By Mouth Daily 7)  Losartan Potassium-Hctz 100-25 Mg Tabs (Losartan  Potassium-Hctz) .Marland Kitchen.. 1 By Mouth Once Daily 8)  Toprol Xl 50 Mg Xr24h-Tab (Metoprolol Succinate) .Marland Kitchen.. 1 By Mouth Once Daily  Allergies (verified): 1)  Ace Inhibitors  Past History:  Past Medical History: Last updated: 10/06/2008 Hyperlipidemia Hypertension Peripheral vascular disease Colonic polyps, hx of Diverticulosis, colon Benign prostatic hypertrophy Depression hx of shingles Allergic rhinitis hx of pos pulm MAI 1998 Diabetes mellitus, type II - diet COUGH  onset 6/09.....................................................................Marland KitchenWert    - h/o ACE intol    - D/c cozaar and amlodipine 03/27/08 trial basis    - resumed April 11, 2008 no improvement  reported off cozaar/amlodipine Diabetes mellitus, type II  Past Surgical History: Last updated: 04/09/2007 EGD-06/07/2004 Colonoscopy-02/17/2004 Carotid endarterectomy - left - 5/08 Appendectomy s/p PUD surgury - ?partial gastrectomy Carpal tunnel release - bilat  Family History: Last updated: 04/09/2007 Family History of Cardiovascular disorder Family History of Congestive Heart Failure Father with PUD Family History of Stroke F 1st degree relative Family History of Stroke M 1st degree relative  Social History: Last updated: 11/11/2009 Former Smoker quit 2/01 Alcohol use-yes 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         all otherwise negative per pt -    Physical Exam  General:  alert and overweight-appearing.   Head:  normocephalic and atraumatic.   Eyes:  vision grossly intact, pupils equal, and pupils round.   Ears:  R ear normal and L ear normal.   Nose:  no external deformity and no nasal discharge.   Mouth:  no gingival abnormalities and pharynx pink and moist.   Neck:  supple and no masses.   Lungs:  normal respiratory effort and normal breath sounds.   Heart:  normal rate and regular rhythm.   Abdomen:  soft, non-tender, and normal bowel sounds.   Msk:  no joint tenderness and no joint swelling.   Extremities:  no edema, no erythema  Neurologic:  cranial nerves II-XII intact and strength normal in all extremities.      Impression & Recommendations:  Problem # 1:  Preventive Health Care (ICD-V70.0) Overall doing well, age appropriate education and counseling updated, referral for preventive services and immunizations addressed, dietary counseling and smoking status adressed , most recent labs reviewed I have personally reviewed and have noted 1.The patient's medical and social history 2.Their use of alcohol, tobacco or illicit drugs 3.Their current medications and supplements 4. Functional ability including ADL's, fall risk, home safety risk, hearing & visual impairment  5.Diet and physical activities 6.Evidence for depression or mood disorders The patients weight, height, BMI  have been recorded in the chart I have made referrals, counseling and provided education to the patient based review of the above   Problem # 2:  DIABETES MELLITUS, TYPE II (ICD-250.00)  His updated medication list for this problem includes:    Ecotrin 325 Mg Tbec (Aspirin) .Marland Kitchen... 1po qd    Losartan Potassium-hctz 100-25 Mg Tabs (Losartan potassium-hctz) .Marland Kitchen... 1 by mouth once daily  Labs Reviewed: Creat: 1.2 (05/07/2010)    Reviewed HgBA1c results: 6.7 (05/07/2010)  6.6 (11/04/2009) stable overall by hx and exam, ok  to continue meds/tx as is   Problem # 3:  HYPERTENSION (ICD-401.9)  His updated medication list for this problem includes:    Amlodipine Besylate 10 Mg Tabs (Amlodipine besylate) .Marland Kitchen... 1 tab by mouth daily    Losartan Potassium-hctz 100-25 Mg Tabs (Losartan potassium-hctz) .Marland Kitchen... 1 by mouth once daily    Toprol Xl 50 Mg Xr24h-tab (Metoprolol succinate) .Marland Kitchen... 1 by mouth once daily  BP today: 120/70 Prior BP: 138/92 (11/11/2009)  Labs Reviewed: K+: 4.3 (05/07/2010) Creat: : 1.2 (05/07/2010)   Chol: 140 (05/07/2010)   HDL: 31.20 (05/07/2010)   LDL: 79 (05/07/2010)   TG: 149.0 (05/07/2010) stable overall by hx and exam, ok to continue meds/tx as is   Complete Medication List: 1)  Omeprazole 20 Mg Cpdr  (Omeprazole) .Marland Kitchen.. 1 by mouth two times a day 2)  Ecotrin 325 Mg Tbec (Aspirin) .Marland Kitchen.. 1po qd 3)  Metamucil  .... 2 caps.qd 4)  Co Q10  .Marland Kitchen.. 200 mg 1po qd 5)  Crestor 20 Mg Tabs (Rosuvastatin calcium) .Marland Kitchen.. 1 by mouth qhs 6)  Amlodipine Besylate 10 Mg Tabs (Amlodipine besylate) .Marland Kitchen.. 1 tab by mouth daily 7)  Losartan Potassium-hctz 100-25 Mg Tabs (Losartan potassium-hctz) .Marland Kitchen.. 1 by mouth once daily 8)  Toprol Xl 50 Mg Xr24h-tab (Metoprolol succinate) .Marland Kitchen.. 1 by mouth once daily  Other Orders: Pneumococcal Vaccine (04540) Admin 1st Vaccine (98119)  Patient Instructions: 1)  you had the pneumonia shot today 2)  Continue all previous medications as before this visit 3)  Please call for your appt with Dr Darrick Penna, vascular surgeon 4)  Please schedule a follow-up appointment in 6 months with: 5)  BMP prior to visit, ICD-9: 250.02 6)  Lipid Panel prior to visit, ICD-9: 7)  HbgA1C prior to visit, ICD-9:   Orders Added: 1)  Pneumococcal Vaccine [90732] 2)  Admin 1st Vaccine [90471] 3)  Est. Patient 65& > [14782]   Immunizations Administered:  Pneumonia Vaccine:    Vaccine Type: Pneumovax    Site: left deltoid    Mfr: Merck    Dose: 0.5 ml    Route: IM    Given by: Zella Ball Ewing CMA (AAMA)    Exp. Date: 09/24/2011    Lot #: [id number]    VIS given: 05/04/09 version given May 13, 2010.   Immunizations Administered:  Pneumonia Vaccine:    Vaccine Type: Pneumovax    Site: left deltoid    Mfr: Merck    Dose: 0.5 ml    Route: IM    Given by: Zella Ball Ewing CMA (AAMA)    Exp. Date: 09/24/2011    Lot #: [id number]    VIS given: 05/04/09 version given May 13, 2010.

## 2010-08-25 ENCOUNTER — Other Ambulatory Visit: Payer: Self-pay

## 2010-08-25 ENCOUNTER — Ambulatory Visit: Payer: Self-pay | Admitting: Vascular Surgery

## 2010-08-26 ENCOUNTER — Other Ambulatory Visit (INDEPENDENT_AMBULATORY_CARE_PROVIDER_SITE_OTHER): Payer: Medicare Other

## 2010-08-26 ENCOUNTER — Encounter (INDEPENDENT_AMBULATORY_CARE_PROVIDER_SITE_OTHER): Payer: Medicare Other | Admitting: Vascular Surgery

## 2010-08-26 DIAGNOSIS — I6529 Occlusion and stenosis of unspecified carotid artery: Secondary | ICD-10-CM

## 2010-08-26 DIAGNOSIS — Z48812 Encounter for surgical aftercare following surgery on the circulatory system: Secondary | ICD-10-CM

## 2010-08-27 NOTE — Assessment & Plan Note (Signed)
OFFICE VISIT  AUL, MANGIERI DOB:  22-Feb-1930                                       08/26/2010 UEAVW#:[id number]  The patient returns for followup today.  He previously underwent a left carotid endarterectomy by Dr. Madilyn Fireman in February of 2008.  He denies any symptoms currently of TIA, amaurosis or stroke.  He does get some occasional dizziness but this primarily seems to be postural in nature and mainly occurs when he rolls over in bed quickly.  CHRONIC MEDICAL PROBLEMS:  Remain hypertension, elevated cholesterol, COPD.  These are all currently controlled and followed by Dr. Jonny Ruiz.  MEDICATIONS:  Include aspirin 325 mg once a day, amlodipine, Cozaar, omeprazole, Crestor, Metamucil, CoQ10, metoprolol.  PAST MEDICAL HISTORY:  Otherwise unremarkable.  SOCIAL HISTORY:  He is married, has 2 children.  He is retired.  He is a former smoker.  He does not drink alcohol regularly.  FAMILY HISTORY:  Not remarkable for early vascular disease.  REVIEW OF SYSTEMS:  Today he has some occasional pain in his legs when walking.  He has some occasional dizziness as mentioned above. Otherwise review of systems is negative.  ALLERGIES:  He has no known drug allergies.  PHYSICAL EXAM:  Vital signs:  Blood pressure is 152/70 in the right arm, 133/79 in the left arm, respirations are 20, heart rate 64 and regular. Neck:  Has no carotid bruits.  Chest:  Clear to auscultation.  Cardiac: Regular rate and rhythm without murmur.  Upper extremity and lower extremity motor strength is 5/5 and symmetric.  He had a repeat carotid duplex exam today which showed less than 40% right internal carotid artery stenosis and widely patent left internal carotid artery.  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  Overall the patient is doing well.  He has had no recurrent stenosis. He will follow up in 1 year's time for repeat carotid duplex exam to make  sure he is not developing worsening stenosis on the right side or recurrent stenosis in the left.    Janetta Hora. Jariah Jarmon, MD Electronically Signed  CEF/MEDQ  D:  08/26/2010  T:  08/27/2010  Job:  [id number]  cc:   [name], MD

## 2010-09-06 NOTE — Procedures (Unsigned)
CAROTID DUPLEX EXAM  INDICATION:  Carotid disease  HISTORY: Diabetes:  no Cardiac:  no Hypertension:  yes Smoking:  Previous Previous Surgery:  Left carotid endarterectomy 07/18/1999 by Dr. Madilyn Fireman CV History:  Currently asymptomatic Amaurosis Fugax No, Paresthesias No, Hemiparesis No                                      RIGHT             LEFT Brachial systolic pressure:         170               152 Brachial Doppler waveforms:         normal            monophasic Vertebral direction of flow:        Antegrade         Bidirectional DUPLEX VELOCITIES (cm/sec) CCA peak systolic                   80                67 ECA peak systolic                   214               69 ICA peak systolic                   78                71 ICA end diastolic                   18                12 PLAQUE MORPHOLOGY:                  calcific PLAQUE AMOUNT:                      Minimal           None PLAQUE LOCATION:                    Bifurcation, ECA  IMPRESSION: 1. Right internal carotid artery velocity suggests 1% to 39% stenosis. 2. Patent left carotid endarterectomy site with no evidence of     restenosis of the internal carotid artery. 3. Bidirectional left vertebral artery. 4. Stable from previous study.  ___________________________________________ Janetta Hora Darrick Penna, MD  EM/MEDQ  D:  08/26/2010  T:  08/26/2010  Job:  [id number]

## 2010-10-12 NOTE — Procedures (Signed)
CAROTID DUPLEX EXAM   INDICATION:  Follow up carotid artery disease.   HISTORY:  Diabetes:  No  Cardiac:  No  Hypertension:  Yes  Smoking:  No  Previous Surgery:  Left carotid endarterectomy with DPA on 07/17/2006 by  Dr. Madilyn Fireman  CV History:  No  Amaurosis Fugax No, Paresthesias No, Hemiparesis No                                       RIGHT             LEFT  Brachial systolic pressure:         160               160  Brachial Doppler waveforms:         Triphasic         Triphasic  Vertebral direction of flow:        Antegrade         Atypical  DUPLEX VELOCITIES (cm/sec)  CCA peak systolic                   98                114  ECA peak systolic                   201               168  ICA peak systolic                   79                102  ICA end diastolic                   22                17  PLAQUE MORPHOLOGY:                  Calcified with shadowing            Mixed  PLAQUE AMOUNT:                      Moderate-to-severe                  Mild-to-moderate  PLAQUE LOCATION:                    ICA, ECA          ECA   IMPRESSION:  1. Bilateral external carotid artery stenosis.  2. Internal carotid artery stenosis 20-39%; however, calcified plaque      with shadowing on the right could obscure a more severe internal      carotid artery stenosis.  3. No left internal carotid artery stenosis, status post      endarterectomy.   ___________________________________________  P. Liliane Bade, M.D.   DP/MEDQ  D:  02/06/2007  T:  02/06/2007  Job:  [id number]

## 2010-10-12 NOTE — Procedures (Signed)
CAROTID DUPLEX EXAM   INDICATION:  Follow up carotid artery disease.   HISTORY:  Diabetes:  No.  Cardiac:  No.  Hypertension:  Yes.  Smoking:  Previous Surgery:  Left carotid endarterectomy with DPA on 07/17/06 by  Dr. Madilyn Fireman.  CV History:  Amaurosis Fugax No, Paresthesias No, Hemiparesis No.                                       RIGHT             LEFT  Brachial systolic pressure:         150               144  Brachial Doppler waveforms:         WNL               WNL  Vertebral direction of flow:        Antegrade         Antegrade  DUPLEX VELOCITIES (cm/sec)  CCA peak systolic                   111               123  ECA peak systolic                   238               129  ICA peak systolic                   135               113  ICA end diastolic                   31                33  PLAQUE MORPHOLOGY:                  Calcified         None  PLAQUE AMOUNT:                      Moderate          None  PLAQUE LOCATION:                    BIF, ICA, ECA     None   IMPRESSION:  1. 30-49% right internal carotid artery stenosis.  2. Patent left internal carotid artery with no evidence of restenosis.       ___________________________________________  Di Kindle. Edilia Bo, M.D.   AC/MEDQ  D:  08/12/2008  T:  08/13/2008  Job:  [id number]

## 2010-10-12 NOTE — Procedures (Signed)
CAROTID DUPLEX EXAM   INDICATION:  Carotid disease.   HISTORY:  Diabetes:  No.  Cardiac:  No.  Hypertension:  Yes.  Smoking:  No.  Previous Surgery:  Left carotid endarterectomy on 07/18/1999 by Dr.  Madilyn Fireman.  CV History:  Currently asymptomatic.  Amaurosis Fugax No, Paresthesias No, Hemiparesis No.                                       RIGHT             LEFT  Brachial systolic pressure:         178               156  Brachial Doppler waveforms:         Normal            Monophasic  Vertebral direction of flow:        Antegrade         Bidirectional  DUPLEX VELOCITIES (cm/sec)  CCA peak systolic                   88                87  ECA peak systolic                   166               112  ICA peak systolic                   87                76  ICA end diastolic                   16                15  PLAQUE MORPHOLOGY:                  Mixed  PLAQUE AMOUNT:                      Mild              None  PLAQUE LOCATION:                    ICA/ECA   IMPRESSION:  1. 1-39% stenosis of the right internal carotid artery.  2. Patent left carotid endarterectomy site with no left internal      carotid artery stenosis.  3. Bidirectional left vertebral artery flow with a significant      difference in the bilateral brachial pressures.  The velocity of      the left proximal subclavian artery was 186 cm/s.  4. Velocities of the right internal carotid artery are less than      previously recorded when compared to the previous examination on      08/12/2008 with the left internal carotid artery remaining stable.   ___________________________________________  Di Kindle. Edilia Bo, M.D.   CH/MEDQ  D:  08/14/2009  T:  08/14/2009  Job:  [id number]

## 2010-10-12 NOTE — Procedures (Signed)
CAROTID DUPLEX EXAM   INDICATION:  Follow up of known carotid artery disease.  Patient is  asymptomatic.   HISTORY:  Diabetes:  No.  Cardiac:  No.  Hypertension:  Yes.  Smoking:  No.  Previous Surgery:  Left CEA with DPA on 07/17/2006 by Dr. Madilyn Fireman.  CV History:  Amaurosis Fugax No, Paresthesias No, Hemiparesis No                                       RIGHT             LEFT  Brachial systolic pressure:         156               150  Brachial Doppler waveforms:         WNL               WNL  Vertebral direction of flow:        Antegrade         Antegrade  DUPLEX VELOCITIES (cm/sec)  CCA peak systolic                   94                90  ECA peak systolic                   228               224  ICA peak systolic                   129               81  ICA end diastolic                   29                16  PLAQUE MORPHOLOGY:                  Calcific with shadowing             N/A  PLAQUE AMOUNT:                      Moderate          N/A  PLAQUE LOCATION:                    Bifurcation, ICA, ECA               ECA   IMPRESSION:  1. Right 40-59% ICA stenosis.  2. No recurrent stenosis status post left CEA.  3. Bilateral ECA stenoses.  4. Bilateral antegrade flow in vertebral arteries, however, left      vertebral artery demonstrates early systolic deceleration      consistent with subclavian stenosis.   ___________________________________________  P. Liliane Bade, M.D.   PB/MEDQ  D:  08/21/2007  T:  08/21/2007  Job:  [id number]

## 2010-10-15 NOTE — Op Note (Signed)
NAMEKITT, LEDET               ACCOUNT NO.:  [id number]   MEDICAL RECORD NO.:  [id number]          PATIENT TYPE:  INP   LOCATION:  2550                         FACILITY:  [facility]   PHYSICIAN:  Balinda Quails, M.D.    DATE OF BIRTH:  April 21, 1930   DATE OF PROCEDURE:  07/17/2006  DATE OF DISCHARGE:                               OPERATIVE REPORT   SURGEON:  Denman George, MD   ASSISTANT:  Constance Holster, PA.   ANESTHETIC:  General endotracheal.   ANESTHESIOLOGIST:  Edwards.   PREOPERATIVE DIAGNOSIS:  Severe left internal carotid artery stenosis.   POSTOPERATIVE DIAGNOSIS:  Severe left internal carotid artery stenosis.   PROCEDURE:  Left carotid endarterectomy, Dacron patch angioplasty.   CLINICAL NOTE:  Daniel Woodard is a 77 year old male seen in the office  with severe left internal carotid artery stenosis by Doppler evaluation.  He underwent MR angiography which verified tandem lesions in the carotid  bulb and proximal left internal carotid artery which were severe.  He is  brought to the operating at this time for left carotid endarterectomy.   INDICATIONS FOR OPERATION:  Reduction of stroke risk.  Potential  complications include MI, CVA, cranial nerve injury and death.   OPERATIVE PROCEDURE:  The patient brought to the operating room in  stable hemodynamic condition.  Placed under general endotracheal  anesthesia.  Foley catheter and arterial line in place.  Left neck  prepped and draped in sterile fashion.   Curvilinear skin incision made along the upper border of the left  sternomastoid muscle.  Dissection carried through the subcutaneous  tissue electrocautery.  Deep dissection carried down through the  platysma.  Further dissection carried along the anterior border of the  sternomastoid to the carotid sheath.  Facial vein ligated with 2-0 silk  and divided.  The common carotid artery mobilized down to the omohyoid  muscle and encircled with vessel loop.  The  superior thyroid external  carotid were freed and encircled with vessel loop.  The internal carotid  artery followed distally up to the posterior belly of the digastric  muscle and encircled with vessel loop.  The hypoglossal nerve identified  distally and preserved.  The vagus nerve reflected posteriorly and  preserved.   The patient administered 7000 units heparin intravenously.  Carotid  vessels controlled with clamps.  Longitudinal arteriotomy made in the  distal common carotid artery.  The arteriotomy extended across carotid  bulb and up into the internal carotid artery.  There was a heavily  calcified plaque at the carotid bulb and origin of left internal carotid  artery with a high-grade stenosis estimated greater than 80%.  A shunt  was inserted.   The plaque removed with an endarterectomy elevator.  The endarterectomy  carried down to the common carotid artery where plaque was divided  transversely with Potts scissors.  Plaque then raised up into the bulb  where the superior thyroid and external carotid were endarterectomized  using an eversion technique.  The distal internal carotid artery plaque  then feathered out well.  Fragments of plaque removed with fine  forceps.  The site irrigated with heparin saline and dextran solutions.   The patch angioplasty of the endarterectomy site carried out with a  Finesse Dacron patch using running 6-0 Prolene suture.  At completion of  patch angioplasty, the shunt was removed.  All vessels well flushed.  Clamps removed directing initial antegrade flow up the external carotid  artery, following this the internal carotid was released.   There is an excellent pulse and Doppler signal in the distal internal  carotid artery.   Adequate hemostasis obtained.  The patient administered 50 mg protamine  intravenously.  Sternomastoid fascia closed with running 2-0 Vicryl  suture.  Platysma closed with running 3-0 Vicryl suture.  Skin closed 4-   0 Monocryl.  Steri-Strips applied.   The patient tolerated procedure well.  No apparent complications.  Transferred to recovery room in stable condition.      Balinda Quails, M.D.  Electronically Signed     PGH/MEDQ  D:  07/17/2006  T:  07/17/2006  Job:  [id number]   cc:   [name], MD

## 2010-10-15 NOTE — Op Note (Signed)
Daniel Woodard, Daniel Woodard               ACCOUNT NO.:  [id number]   MEDICAL RECORD NO.:  [id number]          PATIENT TYPE:  AMB   LOCATION:  DSC                          FACILITY:  [facility]   PHYSICIAN:  Cindee Salt, M.D.       DATE OF BIRTH:  06-30-1929   DATE OF PROCEDURE:  03/16/2005  DATE OF DISCHARGE:                                 OPERATIVE REPORT   PREOPERATIVE DIAGNOSIS:  Carpal tunnel syndrome, left hand.   POSTOPERATIVE DIAGNOSIS:  Carpal tunnel syndrome, left hand.   OPERATION:  Decompression of left median nerve.   SURGEON:  Dr. Merlyn Lot.   ASSISTANT:  Imam R.N.   ANESTHESIA:  Forearm based IV regional.   HISTORY:  The patient is a 77 year old male with a history of carpal tunnel  syndrome. EMG nerve conduction is positive which has not responded to  conservative treatment. He has undergone the right side and desires having  the left-sided released.   PROCEDURE:  The patient was brought to the operating room where a forearm-  based IV regional anesthetic was carried out without difficulty, was prepped  using DuraPrep, supine position, left arm free. Longitudinal incision was  made in the palm and carried down through subcutaneous tissue. Bleeders were  electrocauterized. Palmar fascia was split. Superficial palmar arch  identified. Flexor tendon to the ringer and middle finger identified to the  ulnar side of the median nerve. The carpal retinaculum was incised with  sharp dissection. Right angle and Sewall retractor were placed between skin  and forearm fascia. Fascia was released for approximately a centimeter half  proximal to the wrist crease under direct vision. Area of compression to the  nerve was apparent along with a persistent median artery. This was not  thrombosed and not resected. The wound was irrigated. Skin was closed  interrupted 5-0 nylon sutures. Sterile compressive dressing and splint was  applied. The patient tolerated the procedure well and was taken to  the  recovery room for observation in satisfactory condition. He is discharged  home to return to the [facility] in one week on Vicodin.           ______________________________  Cindee Salt, M.D.     GK/MEDQ  D:  03/16/2005  T:  03/16/2005  Job:  [id number]

## 2010-10-15 NOTE — Discharge Summary (Signed)
NAMEVERNICE, BOWKER               ACCOUNT NO.:  [id number]   MEDICAL RECORD NO.:  [id number]          PATIENT TYPE:  INP   LOCATION:  3301                         FACILITY:  [facility]   PHYSICIAN:  Balinda Quails, M.D.    DATE OF BIRTH:  November 21, 1929   DATE OF ADMISSION:  07/17/2006  DATE OF DISCHARGE:  07/18/2006                               DISCHARGE SUMMARY   ADMISSION DIAGNOSIS:  Left internal carotid artery stenosis.   DISCHARGE DIAGNOSES:  1. Left internal carotid artery stenosis status post left carotid      endarterectomy.  2. Extracranial cerebrovascular occlusive disease.  3. Hypertension.  4. Hyperlipidemia.  5. Chronic obstructive pulmonary disease.   PROCEDURES:  On July 17, 2006, the patient underwent an left carotid  endarterectomy and Dacron patch angioplasty by Dr. Liliane Bade.   HISTORY AND PHYSICAL:  This is a 77 year old male referred to Dr. Juanda Chance  for evaluation of severe left internal carotid artery stenosis.  Doppler  evaluation carried out at [facility] on April 16, 2006, revealed an  area of severe stenosis in left carotid bulb __________ and moderately  severe left internal carotid artery stenosis.  These findings are  verified by MR angiogram of the neck, an area of tandem stenosis  __________ and proximal left internal carotid artery of at least 75%.  Also noted a lesion in the left subclavian artery.  The patient denies  history of stroke.  He has no sensory, motor or visual defects.  He does  still remain asymptomatic.  It was best thought that the patient undergo  elective repair of left carotid artery to prevent further complications,  such as, MI and CVA.  The patient agrees to proceed.   HOSPITAL COURSE:  The patient's hospital course was uneventful.  He  underwent a left carotid endarterectomy and Dacron patch angioplasty on  July 17, 2006, by Dr. Liliane Bade without any complications.  On  postop day # 1, the patient remained stable.   His blood pressure was  controlled.  He remained afebrile.  He was weaned from his oxygen,  breathing 94% O2 sats on room air.  Patient remained hemodynamically  stable, hemoglobin 12.3 and hematocrit 36.1.  Renal function remained  intact, BUN of 16, creatinine of 1.08.  Potassium was stable at 4.2.  The patient does have an increase in white blood cells to 15.1; however,  he did not have any signs and symptoms of infection.  He was voiding  without any difficulty.  He tolerated his breakfast well.  His pain was  well controlled.   The patient was out of bed and ambulating prior to discharge.  His  incisions were clean, dry, and intact.  Neuro status:  Alert and  oriented.  Tongue midline. Sensory and motor intact.   DISCHARGE DISPOSITION:  The patient was discharged home in stable  health.   MEDICATIONS:  1. Aspirin 325 mg p.o. daily.  2. Lipitor 80 mg p.o. daily.  3. Norvasc 10 mg p.o. daily.  4. Cozaar 25 mg p.o. daily.  5. Zetia 10 mg p.o. daily.  6. Aciphex 20 mg p.o. daily.  7. Metamucil 2 mg p.o. daily.  8. Co-Q10 at 100 mg p.o. daily.  9. __________ 1 p.o. daily.  10.Tylox 1 to 2 tablets every 4 hours p.r.n.   INSTRUCTIONS:  The patient is instructed to follow a low-fat, low-salt  diet.  No driving or heavy lifting of greater than 10 pounds for 3  weeks.  The patient is to ambulate 3-4 times daily and increase activity  as tolerated.  He may shower and clean his incisions with mild soap and  water.  He should call our office if any wound problems should arise, if  his incision is erythematous, drainage, if temperature greater than  101.5.   FOLLOWUP:  The patient will follow up with Dr. Madilyn Fireman in 3 weeks.  The  office will contact him with time and date of appointment.      Constance Holster, PA      P. Liliane Bade, M.D.  Electronically Signed    JMW/MEDQ  D:  08/29/2006  T:  08/29/2006  Job:  [id number]   cc:   Balinda Quails, M.D.

## 2010-10-15 NOTE — Procedures (Signed)
[facility]  Patient:    Daniel Woodard, Daniel Woodard                      MRN: [id number] Proc. Date: 01/27/00 Adm. Date:  [id number] Attending:  Avie Echevaria CC:         Mahala Menghini. Evonnie Dawes, M.D. [facility]  Angelena Sole, M.D. [facility]   Procedure Report  PROCEDURE:  Fiberoptic bronchoscopy, diagnostic with lavage.  SURGEON:  Charlaine Dalton. Sherene Sires, M.D.  HISTORY AND INDICATIONS:  Please see comprehensive pulmonary consultation note done in the office and most recent visit on January 20, 2000, outlined in the indications, risks, and potential benefits.  This patient is a 77 year old white male who quit smoking in March of this year, but has had a persistent severe cough, intermittently purulent in nature, worse when he lies down at night with nothing on x-ray to indicate a mechanism for the cough, and no improvement since he stopped smoking.  Since his evaluation in the office, there has been no change on the exam or history.  The patient agreed to the procedure after the full discussion of risks, benefits, and alternatives in the office.  DESCRIPTION OF PROCEDURE:  The procedure was performed in the bronchoscopy suite with continuous monitoring by surface ECG and oximetry.  The patient was given a total of 2.5 mg of IV Versed and 25 mg of IV Demerol for adequate sedation and cough suppression.  The right nares was prepared with 2% lidocaine spray and 2% lidocaine jelly.  Using a standard flexible fiberoptic bronchoscope, the right nares was easily cannulated with good visualization of the entire oropharynx and larynx.  The cords moved normally and there were no apparent upper airway lesions.  Using additional 1% lidocaine as needed, the entire tracheal bronchial tree was explored bilaterally with the following findings:  1. There was severe diffuse airway edema and also mucopurulent secretions    present, throughout all the major airways.  There were suctioned free.  Just suctioning the airway resulted in an area of superficial hemorrhage.    However, I did not see any definite focal abnormality that would suggest a    neoplasm.  Note should be made however that the airway was poorly    visualized initially because of mucus retention, but then secondary to    erythema and edema, as well as hemorrhage caused by the bronchoscope in    attempting to clear up all the secretions.  Nevertheless, all the airways opened widely at the subsegmental level.  The airways were diffusely lavaged with adequate return.  Will check it for cytology, AFB and fungal stain and culture, as well as routine stain and culture.  IMPRESSION:  Severe retained secretions consistent with chronic bronchitis/chronic obstructive pulmonary disease despite smoking in March of 2001.  No evidence of focal endobronchial lesions to suggest an alternative explanation to the cough.  RECOMMENDATIONS:  Followup in the office which should probably include a CT scan, high resolution looking for evidence of bronchiectasis that would explain why he has had such severe problems with retained secretions despite smoking cessation.  The alternative explanation is that he simply has a paralyzed mucociliary apparatus, and will not be able to cough secretions up effectively due to longstanding smoking, despite having stopped completely. DD:  01/27/00 TD:  01/28/00 Job: [id number]

## 2010-10-15 NOTE — Assessment & Plan Note (Signed)
[facility]                            CARDIOLOGY OFFICE NOTE   Mariah, Harn TREYCE SPILLERS                      MRN:          [id number]  DATE:04/26/2006                            DOB:          12/10/1929    REFERRING PHYSICIAN:  [name], MD   CARDIOLOGY CONSULTATION:   CLINICAL HISTORY:  [name] is 77 years old and has known carotid  disease and peripheral vascular disease and has been seen by Dr. Chales Abrahams  in the past and one time by Dr. Samule Ohm.  He recently had a followup  carotid study, which showed 60-79% stenosis in the left carotid artery  at the bifurcation, but it was difficult to assess, due to the stenosis  being located in the bulb.  This was somewhat similar to the description  on his previous study, which was performed in November of 2004.   Mr. Hornback has no symptoms related to his carotid stenosis.  He  specifically has no numbness, weakness, visual disturbance or speech  disturbance.  He does have a very strong family history of stroke,  described below, so he is very sensitive to his carotid disease and the  possible need for surgery.   Mr. Faulk has no history of known heart disease and had a negative  Myoview scan in 2002.  He has had no chest pain, shortness of breath or  palpitations.   PAST MEDICAL HISTORY:  Significant for peripheral vascular disease with  reduced indices in 2005, showing an index of 0.91 on the right and 0.64  on the left.  However, he has not had any symptoms related to this.  His  past medical history is also significant for hyperlipidemia, for which  he takes cholesterol medicine, and for hypertension.  His last lipid  profile was good with the exception of a low HDL.  His creatinine was  1.3 in November.   CURRENT MEDICATIONS INCLUDE:  1. Lipitor 80 mg daily.  2. Zetia 10 mg daily.  3. Plavix 75 mg daily.  4. Norvasc 10 mg daily.  5. Cozaar 50 mg daily.  6. AcipHex 20 mg daily.  7.  Metamucil.  8. Vitamins.   He is not taking aspirin at the recommendation from Dr. Chales Abrahams, since he  is taking Plavix.   FAMILY HISTORY:  Positive for cerebrovascular disease with a sister who  died of a stroke, another brother who had a stroke and a sister who had  a carotid endarterectomy and a mother who had a stroke and congestive  heart failure.  He also has a brother who had bypass surgery and a  sister who had a prior myocardial infarction.   SOCIAL HISTORY:  He is retired from [employer].  He is married and he and his  wife have two daughters and one grandchild.  He smoked until eight years  ago.   REVIEW OF SYSTEMS:  Positive for some weakness in his hands that is  related to previous surgery.   PHYSICAL EXAMINATION:  On examination today, the blood pressure was  120/76 and pulse 70 and regular.  There was no venous distention.  The  carotid pulses were full.  There was a loud, high-pitched bruit on the  left side.  Chest was clear without rales or rhonchi.  The cardiac  rhythm was regular.  First and second heart sounds were normal.  I heard  no murmurs or gallops.  The abdomen was soft with normal bowel sounds.  There is a midline surgical scar from previous ulcer surgery.  There is  no hepatosplenomegaly.  There are no pulsatile masses.  Femoral pulses  were full and the pedal pulses were equal.  There is no peripheral  edema.  Musculoskeletal system showed no deformities.  The skin was warm  and dry.  Neurological examination revealed no focal neurological signs.   Electrocardiogram showed right bundle branch block and was otherwise  normal.   IMPRESSION:  1. Left carotid stenosis with 60-80% estimation by Dopplers, but      possibly worse due to location at the carotid bulb - asymptomatic.  2. Hypertension.  3. Hyperlipidemia.  4. Markedly positive family history for stroke and coronary heart      disease.   RECOMMENDATIONS:  I discussed the situation with Dr. Excell Seltzer  today and we  think the best plan is to evaluate Mr. Beneke with an MRA.  If this  suggests a higher-grade stenosis, then we will need to consider surgery.     Bruce Elvera Lennox Juanda Chance, MD, [place]  Electronically Signed    BRB/MedQ  DD: 04/26/2006  DT: 04/26/2006  Job #: [id number]

## 2010-10-15 NOTE — Op Note (Signed)
NAME:  VALERIA, KRISKO NO.:  [id number]   MEDICAL RECORD NO.:  [id number]                   PATIENT TYPE:  OIB   LOCATION:  2899                                 FACILITY:  [facility]   PHYSICIAN:  Veneda Melter, M.D. [facility]               DATE OF BIRTH:  July 01, 1929   DATE OF PROCEDURE:  05/17/2002  DATE OF DISCHARGE:                                 OPERATIVE REPORT   PROCEDURES PERFORMED:  1. Thoracic aortogram.  2. Bilateral cerebral angiogram.  3. Bilateral vertebral angiogram.   DIAGNOSIS:  1. Peripheral vascular disease.  2. Cerebrovascular disease.  3. Hypertension.   HISTORY:  This patient is a 77 year old white male with a history of tobacco  use with hypertension, dyslipidemia, obstructive lung disease, and  peripheral vascular disease, who presents for assessment of worsening left  internal carotid artery stenosis.  The patient has not had a history of  stroke or transient ischemic attacks.  In January of 2003, carotid Doppler  showed moderate left ICA stenosis with mid-ICA velocity of 176/52 cm per  second.  In November of 2003, proximal velocities had increased to 317/74 cm  per second, while mid velocities were 199/51 cm per second. This represented  an increase of the ICA:CCA ratio from 1.5 to 4.7.  Calcification and severe  plaquing was noted of the left carotid bifurcation.  It was felt that  further assessment with angiography would be beneficial to determine if the  patient had critical disease.   TECHNIQUE:  Informed consent was obtained. The patient was brought to the  peripheral vascular lab.  A 5 French sheath was placed in the right femoral  artery using modified Seldinger technique.  A 5 French pigtail catheter was  advanced into the ascending aorta, and an aortic arch angiography was  performed using power injections of contrast and digital subtraction  angiography.  A Headhunter-I catheter was then introduced and placed in the  right brachiocephalic artery and an angiography then performed.  The right  common carotid artery was then selectively engaged, and angiography  performed of the common carotid artery, as well as the intercerebral vessels  in two views using digital subtraction angiography and manually injections  of contrast.  Using a wooly wire, the catheter was positioned in the right  vertebral artery and selective angiography performed of the vertebral  artery, as well as the posterior circulation.  The catheter was then  retracted and carefully positioned in the left subclavian artery.  Angiography was performed of the left vertebral artery in two views, using  manual injection of contrast.  This catheter was then removed and a Simmons-  II catheter introduced.  This was used to engage the left common carotid  artery, and selective angiography performed of the common carotid artery,  extending to the bifurcation in two views and then of the intracranial  vessels, also in two views using  manual injections of contrast.  At the  termination of the case, the catheters were removed. The sheath was removed,  and manual pressure was applied until hemostasis was achieved.  The patient  tolerated the procedure well.  He was neurologically intact prior to and  following the case.  He was then transferred to the outpatient center in  stable condition.   FINDINGS:  1. The aortic arch is of normal caliber.  There is mild atheromatous     buildup.  There is a bovine arch configuration with the origin of the     left common carotid artery from the base of the brachiocephalic artery.     The origins of the great vessels all have mild irregularities.  2. Right brachiocephalic and subclavian arteries are patent with mild     atheromatous plaque.  The right vertebral artery is patent with mild     disease at the ostium of 10-20%.  The posterior communicating artery is     patent.  3. Right common carotid artery is  patent.  There is mild plaque and     calcification at the bifurcation of less than 10%.  The internal carotid     artery has mild disease at its origin.  The external carotid artery has     mild disease as well.  The intracranial vessels are patent with mild     disease in the middle cerebral artery of 10-20%.  4. The left vertebral artery is patent.  There is an ostial narrowing of     30%.  Posterior circulation appears intact with patent communicating     artery.  The left common carotid artery is patent.  There is     calcification at the level of the bifurcation with only mild stenosis.     The external carotid artery has a high-grade stenosis of 80% at its     origin with mild disease in the mid and distal segments.  The internal     carotid artery has mild tortuosity in the proximal segment with     calcification.  There are sequential narrowings of approximately 60-70%.     Intracranial vessels are patent without significant abnormalities.   ASSESSMENT AND PLAN:  Mr. Pilz is a 77 year old gentleman with  noncritical stenosis of the left carotid artery.  Continued medical therapy  will be recommended at this point.  Continued noninvasive testing will be  performed.  Should the patient have continued progression of disease,  intervention may be required.                                               Veneda Melter, M.D. [facility]    NG/MEDQ  D:  05/17/2002  T:  05/18/2002  Job:  [id number]   cc:   [facility], [facility]

## 2010-10-15 NOTE — Op Note (Signed)
Daniel Woodard, Daniel Woodard               ACCOUNT NO.:  [id number]   MEDICAL RECORD NO.:  [id number]          PATIENT TYPE:  AMB   LOCATION:  DSC                          FACILITY:  [facility]   PHYSICIAN:  Cindee Salt, M.D.       DATE OF BIRTH:  02-22-1930   DATE OF PROCEDURE:  02/16/2005  DATE OF DISCHARGE:                                 OPERATIVE REPORT   PREOPERATIVE DIAGNOSIS:  Carpal tunnel syndrome, right hand.   POSTOPERATIVE DIAGNOSIS:  Carpal tunnel syndrome, right hand.   OPERATION:  Decompression of right median nerve.   SURGEON:  Cindee Salt, M.D.   ASSISTANT:  Alfredo Bach R.N.   ANESTHESIA:  Forearm0based IV regional.   HISTORY:  The patient is a 77 year old male with a history of carpal tunnel  syndrome, EMG and nerve conductions positive bilaterally.  He is admitted  for release of his right side.  He is aware of risks and complications, and  is desirous of proceeding.  Prior to the surgery, the limb was marked by  both patient and surgeon.   PROCEDURE:  The patient was taken to the operating room where a forearm-  based IV regional anesthetic was carried out without difficulty.  He was  prepped using DuraPrep, supine position, right arm free.  A longitudinal  incision was made in the palm and carried down through subcutaneous tissue.  Bleeders were electrocauterized.  Palmar fascia was split, superficial  palmar arch identified, flexor tendon to the ring finger identified.  To the  ulnar side of the median nerve, the carpal retinaculum was incised with  sharp dissection.  A right-angle and Sewell retractor were placed between  skin and forearm fascia.  The fascia was released for approximately a  centimeter and a half proximal to the wrist crease under direct vision.  The  canal was explored.  Tenosynovial tissue was moderately thickened.  No  further lesions were identified.  The wound was irrigated.  Skin was closed  with interrupted 5-0 nylon sutures.  A sterile  compressive dressing and  splint were applied.  The patient tolerated the procedure well and was taken  to the recovery room for observation in satisfactory condition.   He is discharged home to return to the [facility] [place] in 1 week  on Vicodin.           ______________________________  Cindee Salt, M.D.     GK/MEDQ  D:  02/16/2005  T:  02/16/2005  Job:  [id number]

## 2010-11-10 ENCOUNTER — Other Ambulatory Visit: Payer: Self-pay

## 2010-11-10 ENCOUNTER — Other Ambulatory Visit: Payer: Self-pay | Admitting: Internal Medicine

## 2010-11-10 ENCOUNTER — Telehealth: Payer: Self-pay | Admitting: *Deleted

## 2010-11-10 DIAGNOSIS — IMO0001 Reserved for inherently not codable concepts without codable children: Secondary | ICD-10-CM

## 2010-11-10 NOTE — Telephone Encounter (Signed)
Pt states that he will wait until appointment day to get lab work done.

## 2010-11-10 NOTE — Telephone Encounter (Signed)
Pt is req a call regarding lab orders.

## 2010-11-17 ENCOUNTER — Ambulatory Visit (INDEPENDENT_AMBULATORY_CARE_PROVIDER_SITE_OTHER): Payer: Medicare Other | Admitting: Internal Medicine

## 2010-11-17 ENCOUNTER — Other Ambulatory Visit (INDEPENDENT_AMBULATORY_CARE_PROVIDER_SITE_OTHER): Payer: Medicare Other

## 2010-11-17 ENCOUNTER — Encounter: Payer: Self-pay | Admitting: Internal Medicine

## 2010-11-17 VITALS — BP 102/60 | HR 66 | Temp 97.8°F | Ht 73.0 in | Wt 202.0 lb

## 2010-11-17 DIAGNOSIS — I10 Essential (primary) hypertension: Secondary | ICD-10-CM

## 2010-11-17 DIAGNOSIS — J449 Chronic obstructive pulmonary disease, unspecified: Secondary | ICD-10-CM

## 2010-11-17 DIAGNOSIS — Z0000 Encounter for general adult medical examination without abnormal findings: Secondary | ICD-10-CM

## 2010-11-17 DIAGNOSIS — E119 Type 2 diabetes mellitus without complications: Secondary | ICD-10-CM

## 2010-11-17 DIAGNOSIS — E785 Hyperlipidemia, unspecified: Secondary | ICD-10-CM

## 2010-11-17 DIAGNOSIS — Z0001 Encounter for general adult medical examination with abnormal findings: Secondary | ICD-10-CM | POA: Insufficient documentation

## 2010-11-17 LAB — LIPID PANEL
HDL: 32.7 mg/dL — ABNORMAL LOW (ref >39.00)
Total CHOL/HDL Ratio: 4
Triglycerides: 120 mg/dL (ref 0.0–149.0)
VLDL: 24 mg/dL (ref 0.0–40.0)

## 2010-11-17 LAB — BASIC METABOLIC PANEL
Calcium: 8.8 mg/dL (ref 8.4–10.5)
Creatinine, Ser: 1.5 mg/dL (ref 0.4–1.5)
GFR: 49.93 mL/min — ABNORMAL LOW (ref >60.00)
Glucose, Bld: 114 mg/dL — ABNORMAL HIGH (ref 70–99)
Sodium: 137 mEq/L (ref 135–145)

## 2010-11-17 NOTE — Assessment & Plan Note (Signed)
stable overall by hx and exam, most recent data reviewed with pt, and pt to continue medical treatment as before  Lab Results  Component Value Date   LDLCALC 89 11/17/2010

## 2010-11-17 NOTE — Progress Notes (Signed)
Subjective:    Patient ID: Daniel Woodard, male    DOB: 20-Dec-1929, 77 y.o.   MRN: [id number]  HPI  Here to f/u; overall doing ok,  Pt denies chest pain, increased sob or doe, wheezing, orthopnea, PND, increased LE swelling, palpitations, dizziness or syncope.  Pt denies new neurological symptoms such as new headache, or facial or extremity weakness or numbness   Pt denies polydipsia, polyuria, or low sugar symptoms such as weakness or confusion improved with po intake.  Pt states overall good compliance with meds, trying to follow lower cholesterol, diabetic diet,but little exercise however. Lost about 10 lbs since last seen with primarily diet. No other new complaints at this time Past Medical History  Diagnosis Date  . ALLERGIC RHINITIS 04/09/2007  . BACTEREMIA, 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  . BENIGN PROSTATIC HYPERTROPHY 04/09/2007  . BRADYCARDIA 05/11/2009  . COLONIC POLYPS, HX OF 04/09/2007  . COPD 04/09/2007  . Cough 03/27/2008  . DEPRESSION 04/09/2007  . DIABETES MELLITUS, TYPE II 10/09/2007  . DIVERTICULOSIS, COLON 04/09/2007  . DIZZINESS 10/06/2008  . GLUCOSE INTOLERANCE 04/09/2007  . HYPERLIPIDEMIA 03/27/2007  . HYPERTENSION 03/27/2007  . PERIPHERAL VASCULAR DISEASE 03/27/2007  . PNEUMONIA ORGANISM NOS 11/08/2007  . SHINGLES, HX OF 04/09/2007  . SHOULDER PAIN, BILATERAL 08/01/2007   Past Surgical History  Procedure Date  . Carotid endarterectomy 5/08    left  . Appendectomy   . S/p pud surgury     ? partial gastrectomy  . Carpal tunnel release     bilateral  . Esophagogastroduodenoscopy 06/07/2004    reports that he has quit smoking. He does not have any smokeless tobacco history on file. He reports that he drinks alcohol. He reports that he does not use illicit drugs. family history includes Heart disease in his other and Stroke in his other. Allergies  Allergen Reactions  . Ace Inhibitors     REACTION: cough   Current Outpatient Prescriptions on File Prior  to Visit  Medication Sig Dispense Refill  . amLODipine (NORVASC) 10 MG tablet Take 10 mg by mouth daily.        . Coenzyme Q10 (COQ-10) 200 MG CAPS Take by mouth daily.        Marland Kitchen losartan-hydrochlorothiazide (HYZAAR) 100-25 MG per tablet Take 1 tablet by mouth daily.        . metoprolol (TOPROL-XL) 50 MG 24 hr tablet Take 50 mg by mouth daily.        Marland Kitchen omeprazole (PRILOSEC) 20 MG capsule Take 20 mg by mouth 2 (two) times daily.        . psyllium (METAMUCIL) 58.6 % powder 2 caps every day       . rosuvastatin (CRESTOR) 20 MG tablet Take 20 mg by mouth at bedtime.         Review of Systems Review of Systems  Constitutional: Negative for diaphoresis and unexpected weight change.  HENT: Negative for drooling and tinnitus.   Eyes: Negative for photophobia and visual disturbance.  Respiratory: Negative for choking and stridor.   Gastrointestinal: Negative for vomiting and blood in stool.      Objective:   Physical Exam BP 102/60  Pulse 66  Temp(Src) 97.8 F (36.6 C) (Oral)  Ht 6\' 1"  (1.854 m)  Wt 202 lb (91.627 kg)  BMI 26.65 kg/m2  SpO2 94% Physical Exam  VS noted Constitutional: Pt appears well-developed and well-nourished.  HENT: Head: Normocephalic.  Right Ear: External ear normal.  Left Ear: External ear normal.  Eyes:  Conjunctivae and EOM are normal. Pupils are equal, round, and reactive to light.  Neck: Normal range of motion. Neck supple.  Cardiovascular: Normal rate and regular rhythm.   Pulmonary/Chest: Effort normal and breath sounds normal.  Abd:  Soft, NT, non-distended, + BS Neurological: Pt is alert. No cranial nerve deficit.  Skin: Skin is warm. No erythema.  Psychiatric: Pt behavior is normal. Thought content normal.         Assessment & Plan:

## 2010-11-17 NOTE — Assessment & Plan Note (Signed)
stable overall by hx and exam, most recent data reviewed with pt, and pt to continue medical treatment as before  Lab Results  Component Value Date   HGBA1C 6.7* 05/07/2010

## 2010-11-17 NOTE — Assessment & Plan Note (Signed)
stable overall by hx and exam, most recent data reviewed with pt, and pt to continue medical treatment as before  SpO2 Readings from Last 3 Encounters:  11/17/10 94%  05/13/10 94%  11/11/09 94%

## 2010-11-17 NOTE — Patient Instructions (Signed)
Continue all other medications as before Please go to LAB in the Basement for the blood and/or urine tests to be done today Please call the phone number [phone] (the [employer]Tree System) for results of testing in 2-3 days;  When calling, simply dial the number, and when prompted enter the MRN number above (the Medical Record Number) and the # key, then the message should start. Please return in 6 mo with Lab testing done 3-5 days before  

## 2010-11-17 NOTE — Assessment & Plan Note (Signed)
stable overall by hx and exam, most recent data reviewed with pt, and pt to continue medical treatment as before  BP Readings from Last 3 Encounters:  11/17/10 102/60  05/13/10 120/70  11/11/09 138/92

## 2010-12-22 ENCOUNTER — Other Ambulatory Visit: Payer: Self-pay | Admitting: Internal Medicine

## 2011-05-09 ENCOUNTER — Other Ambulatory Visit: Payer: Self-pay | Admitting: Internal Medicine

## 2011-05-12 ENCOUNTER — Other Ambulatory Visit: Payer: Self-pay | Admitting: Internal Medicine

## 2011-05-13 ENCOUNTER — Other Ambulatory Visit (INDEPENDENT_AMBULATORY_CARE_PROVIDER_SITE_OTHER): Payer: Medicare Other

## 2011-05-13 DIAGNOSIS — Z0000 Encounter for general adult medical examination without abnormal findings: Secondary | ICD-10-CM

## 2011-05-13 DIAGNOSIS — E119 Type 2 diabetes mellitus without complications: Secondary | ICD-10-CM

## 2011-05-13 DIAGNOSIS — Z125 Encounter for screening for malignant neoplasm of prostate: Secondary | ICD-10-CM

## 2011-05-13 LAB — LIPID PANEL
Cholesterol: 144 mg/dL (ref 0–200)
Total CHOL/HDL Ratio: 4
Triglycerides: 150 mg/dL — ABNORMAL HIGH (ref 0.0–149.0)

## 2011-05-13 LAB — BASIC METABOLIC PANEL
CO2: 28 mEq/L (ref 19–32)
Chloride: 105 mEq/L (ref 96–112)
Creatinine, Ser: 1.4 mg/dL (ref 0.4–1.5)
Glucose, Bld: 127 mg/dL — ABNORMAL HIGH (ref 70–99)

## 2011-05-13 LAB — URINALYSIS, ROUTINE W REFLEX MICROSCOPIC
Bilirubin Urine: NEGATIVE
Leukocytes, UA: NEGATIVE
Nitrite: NEGATIVE
Urobilinogen, UA: 0.2 (ref 0.0–1.0)

## 2011-05-13 LAB — CBC WITH DIFFERENTIAL/PLATELET
Basophils Absolute: 0 10*3/uL (ref 0.0–0.1)
Eosinophils Absolute: 0.3 10*3/uL (ref 0.0–0.7)
Lymphocytes Relative: 24.1 % (ref 12.0–46.0)
MCHC: 33.9 g/dL (ref 30.0–36.0)
Neutrophils Relative %: 60.8 % (ref 43.0–77.0)
RBC: 4.82 Mil/uL (ref 4.22–5.81)
RDW: 13.3 % (ref 11.5–14.6)

## 2011-05-13 LAB — HEPATIC FUNCTION PANEL
ALT: 20 U/L (ref 0–53)
Albumin: 4.2 g/dL (ref 3.5–5.2)
Bilirubin, Direct: 0.1 mg/dL (ref 0.0–0.3)
Total Protein: 7.8 g/dL (ref 6.0–8.3)

## 2011-05-13 LAB — MICROALBUMIN / CREATININE URINE RATIO
Creatinine,U: 340.6 mg/dL
Microalb Creat Ratio: 3.1 mg/g (ref 0.0–30.0)

## 2011-05-13 LAB — HEMOGLOBIN A1C: Hgb A1c MFr Bld: 6.5 % (ref 4.6–6.5)

## 2011-05-13 LAB — PSA: PSA: 0.66 ng/mL (ref 0.10–4.00)

## 2011-05-19 ENCOUNTER — Encounter: Payer: Self-pay | Admitting: Internal Medicine

## 2011-05-19 ENCOUNTER — Ambulatory Visit (INDEPENDENT_AMBULATORY_CARE_PROVIDER_SITE_OTHER): Payer: Medicare Other | Admitting: Internal Medicine

## 2011-05-19 VITALS — BP 106/62 | HR 59 | Temp 97.7°F | Ht 73.0 in | Wt 202.2 lb

## 2011-05-19 DIAGNOSIS — Z23 Encounter for immunization: Secondary | ICD-10-CM

## 2011-05-19 DIAGNOSIS — Z0000 Encounter for general adult medical examination without abnormal findings: Secondary | ICD-10-CM

## 2011-05-19 DIAGNOSIS — E119 Type 2 diabetes mellitus without complications: Secondary | ICD-10-CM

## 2011-05-19 NOTE — Progress Notes (Signed)
Subjective:    Patient ID: Daniel Woodard, male    DOB: [birth year], 77 y.o.   MRN: [id number]  HPI  Here for wellness and f/u;  Overall doing ok;  Pt denies CP, worsening SOB, DOE, wheezing, orthopnea, PND, worsening LE edema, palpitations, dizziness or syncope.  Pt denies neurological change such as new Headache, facial or extremity weakness.  Pt denies polydipsia, polyuria, or low sugar symptoms. Pt states overall good compliance with treatment and medications, good tolerability, and trying to follow lower cholesterol diet.  Pt denies worsening depressive symptoms, suicidal ideation or panic. No fever, wt loss, night sweats, loss of appetite, or other constitutional symptoms.  Pt states good ability with ADL's, low fall risk, home safety reviewed and adequate, no significant changes in hearing or vision, and occasionally active with exercise.  No acute complaints Past Medical History  Diagnosis Date  . ALLERGIC RHINITIS 04/09/2007  . BACTEREMIA, 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  . BENIGN PROSTATIC HYPERTROPHY 04/09/2007  . BRADYCARDIA 05/11/2009  . COLONIC POLYPS, HX OF 04/09/2007  . COPD 04/09/2007  . Cough 03/27/2008  . DEPRESSION 04/09/2007  . DIABETES MELLITUS, TYPE II 10/09/2007  . DIVERTICULOSIS, COLON 04/09/2007  . DIZZINESS 10/06/2008  . GLUCOSE INTOLERANCE 04/09/2007  . HYPERLIPIDEMIA 03/27/2007  . HYPERTENSION 03/27/2007  . PERIPHERAL VASCULAR DISEASE 03/27/2007  . PNEUMONIA ORGANISM NOS 11/08/2007  . SHINGLES, HX OF 04/09/2007  . SHOULDER PAIN, BILATERAL 08/01/2007   Past Surgical History  Procedure Date  . Carotid endarterectomy 5/08    left  . Appendectomy   . S/p pud surgury     ? partial gastrectomy  . Carpal tunnel release     bilateral  . Esophagogastroduodenoscopy 06/07/2004    reports that he has quit smoking. He does not have any smokeless tobacco history on file. He reports that he drinks alcohol. He reports that he does not use illicit drugs. family  history includes Heart disease in his other and Stroke in his other. Allergies  Allergen Reactions  . Ace Inhibitors     REACTION: cough   Current Outpatient Prescriptions on File Prior to Visit  Medication Sig Dispense Refill  . amLODipine (NORVASC) 10 MG tablet TAKE ONE TABLET BY MOUTH EVERY DAY  60 tablet  4  . aspirin 325 MG tablet Take 325 mg by mouth daily.        . Coenzyme Q10 (COQ-10) 200 MG CAPS Take by mouth daily.        Marland Kitchen losartan-hydrochlorothiazide (HYZAAR) 100-25 MG per tablet TAKE ONE TABLET BY MOUTH EVERY DAY  90 tablet  3  . metoprolol (TOPROL-XL) 50 MG 24 hr tablet TAKE ONE TABLET BY MOUTH EVERY DAY  60 tablet  6  . omeprazole (PRILOSEC) 20 MG capsule Take 20 mg by mouth 2 (two) times daily.        . psyllium (METAMUCIL) 58.6 % powder 2 caps every day       . rosuvastatin (CRESTOR) 20 MG tablet Take 20 mg by mouth at bedtime.         Review of Systems Review of Systems  Constitutional: Negative for diaphoresis, activity change, appetite change and unexpected weight change.  HENT: Negative for hearing loss, ear pain, facial swelling, mouth sores and neck stiffness.   Eyes: Negative for pain, redness and visual disturbance.  Respiratory: Negative for shortness of breath and wheezing.   Cardiovascular: Negative for chest pain and palpitations.  Gastrointestinal: Negative for diarrhea, blood in stool, abdominal distention and rectal pain.  Genitourinary: Negative for hematuria, flank pain and decreased urine volume.  Musculoskeletal: Negative for myalgias and joint swelling.  Skin: Negative for color change and wound.  Neurological: Negative for syncope and numbness.  Hematological: Negative for adenopathy.  Psychiatric/Behavioral: Negative for hallucinations, self-injury, decreased concentration and agitation.      Objective:   Physical Exam BP 106/62  Pulse 59  Temp(Src) 97.7 F (36.5 C) (Oral)  Ht 6\' 1"  (1.854 m)  Wt 202 lb 4 oz (91.74 kg)  BMI 26.68 kg/m2   SpO2 94% Physical Exam  VS noted Constitutional: Pt is oriented to person, place, and time. Appears well-developed and well-nourished.  HENT:  Head: Normocephalic and atraumatic.  Right Ear: External ear normal.  Left Ear: External ear normal.  Nose: Nose normal.  Mouth/Throat: Oropharynx is clear and moist.  Eyes: Conjunctivae and EOM are normal. Pupils are equal, round, and reactive to light.  Neck: Normal range of motion. Neck supple. No JVD present. No tracheal deviation present.  Cardiovascular: Normal rate, regular rhythm, normal heart sounds and intact distal pulses.   Pulmonary/Chest: Effort normal and breath sounds normal.  Abdominal: Soft. Bowel sounds are normal. There is no tenderness.  Musculoskeletal: Normal range of motion. Exhibits no edema.  Lymphadenopathy:  Has no cervical adenopathy.  Neurological: Pt is alert and oriented to person, place, and time. Pt has normal reflexes. No cranial nerve deficit.  Skin: Skin is warm and dry. No rash noted.  Psychiatric:  Has  normal mood and affect. Behavior is normal.     Assessment & Plan:

## 2011-05-19 NOTE — Patient Instructions (Signed)
You had the flu shot today Continue all other medications as before Please have the pharmacy call if you need refills Please return in 1 year for your yearly visit, or sooner if needed, with Lab testing done 3-5 days before

## 2011-05-21 ENCOUNTER — Encounter: Payer: Self-pay | Admitting: Internal Medicine

## 2011-05-21 NOTE — Assessment & Plan Note (Signed)

## 2011-05-27 ENCOUNTER — Other Ambulatory Visit: Payer: Self-pay | Admitting: *Deleted

## 2011-05-27 MED ORDER — OMEPRAZOLE 20 MG PO CPDR: 20.0000 mg | DELAYED_RELEASE_CAPSULE | Freq: Two times a day (BID) | ORAL | Status: DC

## 2011-05-27 NOTE — Telephone Encounter (Signed)
R'cd fax from [facility] for refill of Omeprazole

## 2011-08-03 ENCOUNTER — Other Ambulatory Visit: Payer: Self-pay | Admitting: Internal Medicine

## 2011-08-26 ENCOUNTER — Other Ambulatory Visit: Payer: Medicare Other

## 2011-09-02 ENCOUNTER — Ambulatory Visit (INDEPENDENT_AMBULATORY_CARE_PROVIDER_SITE_OTHER): Payer: Medicare Other | Admitting: *Deleted

## 2011-09-02 DIAGNOSIS — Z48812 Encounter for surgical aftercare following surgery on the circulatory system: Secondary | ICD-10-CM

## 2011-09-02 DIAGNOSIS — I6529 Occlusion and stenosis of unspecified carotid artery: Secondary | ICD-10-CM

## 2011-09-09 ENCOUNTER — Other Ambulatory Visit: Payer: Self-pay | Admitting: *Deleted

## 2011-09-09 ENCOUNTER — Encounter: Payer: Self-pay | Admitting: Vascular Surgery

## 2011-09-09 DIAGNOSIS — I6529 Occlusion and stenosis of unspecified carotid artery: Secondary | ICD-10-CM

## 2011-09-09 DIAGNOSIS — Z48812 Encounter for surgical aftercare following surgery on the circulatory system: Secondary | ICD-10-CM

## 2011-09-09 NOTE — Procedures (Unsigned)
CAROTID DUPLEX EXAM  INDICATION:  Followup carotid artery disease  HISTORY: Diabetes:  No Cardiac:  No Hypertension:  Yes Smoking:  Previously Previous Surgery:  Left carotid endarterectomy 07/18/1999 by Dr. Madilyn Fireman CV History:  Asymptomatic Amaurosis Fugax No, Paresthesias No, Hemiparesis No                                      RIGHT             LEFT Brachial systolic pressure:         130               94 Brachial Doppler waveforms:         Triphasic         Monophasic Vertebral direction of flow:        Antegrade         Retrograde DUPLEX VELOCITIES (cm/sec) CCA peak systolic                   95                106 ECA peak systolic                   180               99 ICA peak systolic                   140               85 ICA end diastolic                   33                20 PLAQUE MORPHOLOGY:                  Calcific          Mixed PLAQUE AMOUNT:                      Moderate          Minimal PLAQUE LOCATION:                    Bifurcation and ICA                 ICA  IMPRESSION: 1. 40%-59% right internal carotid artery stenosis, no significant     change since prior study of 08/12/2008. 2. Patent left carotid endarterectomy site with no evidence of     restenosis. 3. Retrograde left vertebral artery flow and blood pressure     differences as well as monophasic left brachial artery waveform     suggests left subclavian steal.  ___________________________________________ Janetta Hora. Fields, MD  SS/MEDQ  D:  09/02/2011  T:  09/02/2011  Job:  [id number]

## 2011-12-15 ENCOUNTER — Other Ambulatory Visit: Payer: Self-pay | Admitting: Internal Medicine

## 2012-03-02 ENCOUNTER — Other Ambulatory Visit: Payer: Self-pay | Admitting: Internal Medicine

## 2012-03-06 ENCOUNTER — Ambulatory Visit (INDEPENDENT_AMBULATORY_CARE_PROVIDER_SITE_OTHER): Payer: Medicare Other | Admitting: *Deleted

## 2012-03-06 DIAGNOSIS — Z23 Encounter for immunization: Secondary | ICD-10-CM

## 2012-03-21 ENCOUNTER — Other Ambulatory Visit: Payer: Self-pay | Admitting: Internal Medicine

## 2012-04-11 ENCOUNTER — Encounter: Payer: Self-pay | Admitting: Gastroenterology

## 2012-04-13 ENCOUNTER — Encounter: Payer: Self-pay | Admitting: Gastroenterology

## 2012-05-14 ENCOUNTER — Encounter: Payer: Self-pay | Admitting: Gastroenterology

## 2012-05-14 ENCOUNTER — Ambulatory Visit (INDEPENDENT_AMBULATORY_CARE_PROVIDER_SITE_OTHER): Payer: Medicare Other | Admitting: Gastroenterology

## 2012-05-14 VITALS — BP 118/64 | HR 72 | Ht 73.0 in | Wt 204.0 lb

## 2012-05-14 DIAGNOSIS — Z86010 Personal history of colonic polyps: Secondary | ICD-10-CM

## 2012-05-14 NOTE — Progress Notes (Signed)
History of Present Illness: Pleasant 78 year old white male with history of colon polyps in 2004 and 2008 here to schedule followup colonoscopy. At last  Colonoscopy a  2 cm adenomatous polyp was removed. He has no GI complaints including change of bowel habits, abdominal pain, melena or hematochezia    Past Medical History  Diagnosis Date  . ALLERGIC RHINITIS 04/09/2007  . BACTEREMIA, 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  . BENIGN PROSTATIC HYPERTROPHY 04/09/2007  . BRADYCARDIA 05/11/2009  . COLONIC POLYPS, HX OF 04/09/2007    ADENOMATOUS POLYPS 1610,9604  . COPD 04/09/2007  . Cough 03/27/2008  . DEPRESSION 04/09/2007  . DIABETES MELLITUS, TYPE II 10/09/2007  . DIVERTICULOSIS, COLON 04/09/2007  . DIZZINESS 10/06/2008  . GLUCOSE INTOLERANCE 04/09/2007  . HYPERLIPIDEMIA 03/27/2007  . HYPERTENSION 03/27/2007  . PERIPHERAL VASCULAR DISEASE 03/27/2007  . PNEUMONIA ORGANISM NOS 11/08/2007  . SHINGLES, HX OF 04/09/2007  . SHOULDER PAIN, BILATERAL 08/01/2007   Past Surgical History  Procedure Date  . Carotid endarterectomy 5/08    left  . Appendectomy   . S/p pud surgury     ? partial gastrectomy  . Carpal tunnel release     bilateral  . Esophagogastroduodenoscopy 06/07/2004   family history includes Heart disease in his other and Stroke in his other.  There is no history of Colon cancer. Current Outpatient Prescriptions  Medication Sig Dispense Refill  . amLODipine (NORVASC) 10 MG tablet TAKE ONE TABLET BY MOUTH EVERY DAY  60 tablet  1  . aspirin 325 MG tablet Take 325 mg by mouth daily.        . Coenzyme Q10 (COQ-10) 200 MG CAPS Take by mouth daily.        . CRESTOR 20 MG tablet TAKE ONE TABLET BY MOUTH AT BEDTIME  60 each  4  . losartan-hydrochlorothiazide (HYZAAR) 100-25 MG per tablet TAKE ONE TABLET BY MOUTH EVERY DAY  90 tablet  1  . metoprolol (TOPROL-XL) 50 MG 24 hr tablet TAKE ONE TABLET BY MOUTH EVERY DAY  60 tablet  6  . omeprazole (PRILOSEC) 20 MG capsule TAKE ONE  CAPSULE BY MOUTH TWICE DAILY  120 capsule  0  . psyllium (METAMUCIL) 58.6 % powder 2 caps every day        Allergies as of 05/14/2012 - Review Complete 05/14/2012  Allergen Reaction Noted  . Ace inhibitors  04/09/2007    reports that he has quit smoking. He has never used smokeless tobacco. He reports that he does not drink alcohol or use illicit drugs.     Review of Systems: Pertinent positive and negative review of systems were noted in the above HPI section. All other review of systems were otherwise negative.  Vital signs were reviewed in today's medical record Physical Exam: General: Well developed , well nourished, no acute distress Head: Normocephalic and atraumatic Eyes:  sclerae anicteric, EOMI Ears: Normal auditory acuity Mouth: No deformity or lesions Neck: Supple, no masses or thyromegaly Lungs: Clear throughout to auscultation Heart: Regular rate and rhythm; no murmurs, rubs or bruits Abdomen: Soft, non tender and non distended. No masses, hepatosplenomegaly or hernias noted. Normal Bowel sounds Rectal:deferred Musculoskeletal: Symmetrical with no gross deformities  Skin: No lesions on visible extremities Pulses:  Normal pulses noted Extremities: No clubbing, cyanosis, edema or deformities noted Neurological: Alert oriented x 4, grossly nonfocal Cervical Nodes:  No significant cervical adenopathy Inguinal Nodes: No significant inguinal adenopathy Psychological:  Alert and cooperative. Normal mood and affect

## 2012-05-14 NOTE — Assessment & Plan Note (Signed)
Plan followup colonoscopy 

## 2012-05-17 ENCOUNTER — Encounter: Payer: Self-pay | Admitting: Gastroenterology

## 2012-05-17 ENCOUNTER — Ambulatory Visit (AMBULATORY_SURGERY_CENTER): Payer: Medicare Other | Admitting: Gastroenterology

## 2012-05-17 VITALS — BP 126/72 | HR 64 | Temp 98.2°F | Resp 23 | Ht 73.0 in | Wt 204.0 lb

## 2012-05-17 DIAGNOSIS — D126 Benign neoplasm of colon, unspecified: Secondary | ICD-10-CM

## 2012-05-17 DIAGNOSIS — K5730 Diverticulosis of large intestine without perforation or abscess without bleeding: Secondary | ICD-10-CM

## 2012-05-17 DIAGNOSIS — Z86010 Personal history of colonic polyps: Secondary | ICD-10-CM

## 2012-05-17 MED ORDER — SODIUM CHLORIDE 0.9 % IV SOLN: 500.0000 mL | INTRAVENOUS | Status: DC

## 2012-05-17 NOTE — Progress Notes (Signed)
Called to room to assist during endoscopic procedure.  Patient ID and intended procedure confirmed with present staff. Received instructions for my participation in the procedure from the performing physician. ewm 

## 2012-05-17 NOTE — Progress Notes (Signed)
Propofol given over incremental dosages 

## 2012-05-17 NOTE — Op Note (Signed)
[facility] [address] [place], [address]   COLONOSCOPY PROCEDURE REPORT  PATIENT: Daniel, Woodard  MR#: [id number] BIRTHDATE: 11/09/1929 , 82  yrs. old GENDER: Male ENDOSCOPIST: Louis Meckel, MD REFERRED BY: PROCEDURE DATE:  05/17/2012 PROCEDURE:   Colonoscopy with snare polypectomy and Colonoscopy with cold biopsy polypectomy ASA CLASS:   Class II INDICATIONS:adenomatous polyps 2004, 2008. MEDICATIONS: MAC sedation, administered by CRNA and propofol (Diprivan) 100mg  IV  DESCRIPTION OF PROCEDURE:   After the risks benefits and alternatives of the procedure were thoroughly explained, informed consent was obtained.  A digital rectal exam revealed no abnormalities of the rectum.   The LB [id number]  endoscope was introduced through the anus and advanced to the cecum, which was identified by both the appendix and ileocecal valve. No adverse events experienced.   The quality of the prep was Suprep good  The instrument was then slowly withdrawn as the colon was fully examined.      COLON FINDINGS: A sessile polyp measuring 4 mm in size was found in the ascending colon.  A polypectomy was performed with a cold snare.  The resection was complete and the polyp tissue was completely retrieved.   A sessile polyp measuring 3 mm in size was found in the transverse colon.  A polypectomy was performed with a cold snare.  The resection was complete and the polyp tissue was completely retrieved.   A sessile polyp measuring 2 mm in size was found in the descending colon.  A polypectomy was performed with cold forceps.  The resection was complete and the polyp tissue was completely retrieved.   Severe diverticulosis was noted in the sigmoid colon.   The colon mucosa was otherwise normal.   Internal hemorrhoids were found.  Retroflexed views revealed no abnormalities. The time to cecum=3 minutes 22 seconds.  Withdrawal time=14 minutes 37 seconds.  The scope  was withdrawn and the procedure completed. COMPLICATIONS: There were no complications.  ENDOSCOPIC IMPRESSION: 1.  colon polyps 2.  diverticulosis 3.  Internal hemorrhoids  RECOMMENDATIONS: Await biopsy results  eSigned:  Louis Meckel, MD 05/17/2012 2:13 PM   cc: [name], MD   PATIENT NAME:  Daniel, Woodard MR#: [id number]

## 2012-05-17 NOTE — Patient Instructions (Addendum)
YOU HAD AN ENDOSCOPIC PROCEDURE TODAY AT THE [facility]: Refer to the procedure report that was given to you for any specific questions about what was found during the examination.  If the procedure report does not answer your questions, please call your gastroenterologist to clarify.  If you requested that your care partner not be given the details of your procedure findings, then the procedure report has been included in a sealed envelope for you to review at your convenience later.  YOU SHOULD EXPECT: Some feelings of bloating in the abdomen. Passage of more gas than usual.  Walking can help get rid of the air that was put into your GI tract during the procedure and reduce the bloating. If you had a lower endoscopy (such as a colonoscopy or flexible sigmoidoscopy) you may notice spotting of blood in your stool or on the toilet paper. If you underwent a bowel prep for your procedure, then you may not have a normal bowel movement for a few days.  DIET: Your first meal following the procedure should be a light meal and then it is ok to progress to your normal diet.  A half-sandwich or bowl of soup is an example of a good first meal.  Heavy or fried foods are harder to digest and may make you feel nauseous or bloated.  Likewise meals heavy in dairy and vegetables can cause extra gas to form and this can also increase the bloating.  Drink plenty of fluids but you should avoid alcoholic beverages for 24 hours.  ACTIVITY: Your care partner should take you home directly after the procedure.  You should plan to take it easy, moving slowly for the rest of the day.  You can resume normal activity the day after the procedure however you should NOT DRIVE or use heavy machinery for 24 hours (because of the sedation medicines used during the test).    SYMPTOMS TO REPORT IMMEDIATELY: A gastroenterologist can be reached at any hour.  During normal business hours, 8:30 AM to 5:00 PM Monday through Friday,  call [phone].  After hours and on weekends, please call the GI answering service at [phone] who will take a message and have the physician on call contact you.   Following lower endoscopy (colonoscopy or flexible sigmoidoscopy):  Excessive amounts of blood in the stool  Significant tenderness or worsening of abdominal pains  Swelling of the abdomen that is new, acute  Fever of 100F or higher  Black, tarry-looking stools  FOLLOW UP: If any biopsies were taken you will be contacted by phone or by letter within the next 1-3 weeks.  Call your gastroenterologist if you have not heard about the biopsies in 3 weeks.  Our staff will call the home number listed on your records the next business day following your procedure to check on you and address any questions or concerns that you may have at that time regarding the information given to you following your procedure. This is a courtesy call and so if there is no answer at the home number and we have not heard from you through the emergency physician on call, we will assume that you have returned to your regular daily activities without incident.  SIGNATURES/CONFIDENTIALITY: You and/or your care partner have signed paperwork which will be entered into your electronic medical record.  These signatures attest to the fact that that the information above on your After Visit Summary has been reviewed and is understood.  Full responsibility of   the confidentiality of this discharge information lies with you and/or your care-partner.   Please, read all of your handouts about diverticulosis and hemorrhoids.  Try to increase the fiber in your diet.  Thank-you for choosing [place] for your medical care today.

## 2012-05-17 NOTE — Progress Notes (Addendum)
Patient did not have preoperative order for IV antibiotic SSI prophylaxis. (G8918)  Patient did not experience any of the following events: a burn prior to discharge; a fall within the facility; wrong site/side/patient/procedure/implant event; or a hospital transfer or hospital admission upon discharge from the facility. (G8907)  

## 2012-05-18 ENCOUNTER — Other Ambulatory Visit (INDEPENDENT_AMBULATORY_CARE_PROVIDER_SITE_OTHER): Payer: Medicare Other

## 2012-05-18 ENCOUNTER — Other Ambulatory Visit: Payer: Self-pay | Admitting: Internal Medicine

## 2012-05-18 ENCOUNTER — Telehealth: Payer: Self-pay | Admitting: *Deleted

## 2012-05-18 DIAGNOSIS — Z0000 Encounter for general adult medical examination without abnormal findings: Secondary | ICD-10-CM

## 2012-05-18 DIAGNOSIS — E119 Type 2 diabetes mellitus without complications: Secondary | ICD-10-CM

## 2012-05-18 LAB — BASIC METABOLIC PANEL
BUN: 20 mg/dL (ref 6–23)
Calcium: 9.2 mg/dL (ref 8.4–10.5)
Creatinine, Ser: 1.3 mg/dL (ref 0.4–1.5)
GFR: 54.48 mL/min — ABNORMAL LOW (ref >60.00)
Glucose, Bld: 120 mg/dL — ABNORMAL HIGH (ref 70–99)

## 2012-05-18 LAB — MICROALBUMIN / CREATININE URINE RATIO
Creatinine,U: 275.8 mg/dL
Microalb Creat Ratio: 5.8 mg/g (ref 0.0–30.0)
Microalb, Ur: 15.9 mg/dL — ABNORMAL HIGH (ref 0.0–1.9)

## 2012-05-18 LAB — CBC WITH DIFFERENTIAL/PLATELET
Basophils Relative: 0.4 % (ref 0.0–3.0)
Eosinophils Relative: 2.7 % (ref 0.0–5.0)
HCT: 44.8 % (ref 39.0–52.0)
Monocytes Relative: 10.4 % (ref 3.0–12.0)
Neutrophils Relative %: 65.3 % (ref 43.0–77.0)
Platelets: 178 10*3/uL (ref 150.0–400.0)
RBC: 4.94 Mil/uL (ref 4.22–5.81)
WBC: 7.3 10*3/uL (ref 4.5–10.5)

## 2012-05-18 LAB — URINALYSIS, ROUTINE W REFLEX MICROSCOPIC
Bilirubin Urine: NEGATIVE
Ketones, ur: NEGATIVE
Leukocytes, UA: NEGATIVE
Specific Gravity, Urine: >=1.03 (ref 1.000–1.030)
Urine Glucose: NEGATIVE
Urobilinogen, UA: 0.2 (ref 0.0–1.0)

## 2012-05-18 LAB — HEPATIC FUNCTION PANEL
ALT: 18 U/L (ref 0–53)
AST: 19 U/L (ref 0–37)
Bilirubin, Direct: 0.1 mg/dL (ref 0.0–0.3)
Total Bilirubin: 1 mg/dL (ref 0.3–1.2)
Total Protein: 7.3 g/dL (ref 6.0–8.3)

## 2012-05-18 LAB — TSH: TSH: 4.1 u[IU]/mL (ref 0.35–5.50)

## 2012-05-18 LAB — HEMOGLOBIN A1C: Hgb A1c MFr Bld: 6.5 % (ref 4.6–6.5)

## 2012-05-18 LAB — PSA: PSA: 0.65 ng/mL (ref 0.10–4.00)

## 2012-05-18 NOTE — Telephone Encounter (Signed)
Thank you Daniel Woodard.  I hadn't charted the conversation yet, but all that is accurate.    I will forward to Dr. Arlyce Dice so that he is aware.  Sounds like the bleeding has stopped.

## 2012-05-18 NOTE — Telephone Encounter (Signed)
  Follow up Call-  Call back number 05/17/2012  Post procedure Call Back phone  # [phone]  Permission to leave phone message Yes     Patient questions:  Do you have a fever, pain , or abdominal swelling? no Pain Score  0 *  Have you tolerated food without any problems? yes  Have you been able to return to your normal activities? yes  Do you have any questions about your discharge instructions: Diet   no Medications  no Follow up visit  no  Do you have questions or concerns about your Care? yes  Actions: * If pain score is 4 or above: Physician/ provider Notified : Dr. Christella Hartigan   Wife stated that pt had several large bloody bowel movements yesterday after leaving [facility] from his colonoscopy, wife stated they called after hours number and spoke with Dr. Christella Hartigan with recommendations to go to hospital but pt and wife did not want to go, pt laid down after last phone call with Dr. Christella Hartigan and has had no bloody BMs since, wife states pt woke up this morning and said he felt fine, no more bleeding noted,pt has already been to office for lab work , forward to Dr. Christella Hartigan for follow-up.

## 2012-05-18 NOTE — Telephone Encounter (Signed)
Called to follow-up from this morning, pt's wife stated pt is feeling fine, and has had no more evidence or bleeding from rectum, wife states pt is eating and drinking well, advised wife that dr. Christella Hartigan had been informed and that he had forwarded this information to Dr. Arlyce Dice, also advised wife that if he had any other incident or questions to please call

## 2012-05-22 ENCOUNTER — Ambulatory Visit (INDEPENDENT_AMBULATORY_CARE_PROVIDER_SITE_OTHER): Payer: Medicare Other | Admitting: Internal Medicine

## 2012-05-22 ENCOUNTER — Telehealth: Payer: Self-pay | Admitting: Internal Medicine

## 2012-05-22 ENCOUNTER — Encounter: Payer: Self-pay | Admitting: Internal Medicine

## 2012-05-22 VITALS — BP 108/70 | HR 68 | Temp 96.8°F | Ht 73.0 in | Wt 201.0 lb

## 2012-05-22 DIAGNOSIS — Z136 Encounter for screening for cardiovascular disorders: Secondary | ICD-10-CM

## 2012-05-22 DIAGNOSIS — Z0000 Encounter for general adult medical examination without abnormal findings: Secondary | ICD-10-CM

## 2012-05-22 DIAGNOSIS — E119 Type 2 diabetes mellitus without complications: Secondary | ICD-10-CM

## 2012-05-22 MED ORDER — OMEPRAZOLE 20 MG PO CPDR: 20.0000 mg | DELAYED_RELEASE_CAPSULE | Freq: Every day | ORAL | Status: DC

## 2012-05-22 NOTE — Assessment & Plan Note (Signed)

## 2012-05-22 NOTE — Telephone Encounter (Signed)
Message copied by [name] on Tue May 22, 2012 12:54 PM ------      Message from: Scharlene Gloss B      Created: Tue May 22, 2012 11:50 AM       Please clarify directions on omeprazole as pharmacy states is he to take once daily or twice daily.

## 2012-05-22 NOTE — Assessment & Plan Note (Signed)
stable overall by hx and exam, most recent data reviewed with pt, and pt to continue medical treatment as before  Lab Results  Component Value Date   HGBA1C 6.5 05/18/2012

## 2012-05-22 NOTE — Progress Notes (Signed)
Subjective:    Patient ID: Daniel Woodard, male    DOB: [birth year], 78 y.o.   MRN: [id number]  HPI  Here for wellness and f/u;  Overall doing ok;  Pt denies CP, worsening SOB, DOE, wheezing, orthopnea, PND, worsening LE edema, palpitations, dizziness or syncope.  Pt denies neurological change such as new Headache, facial or extremity weakness.  Pt denies polydipsia, polyuria, or low sugar symptoms. Pt states overall good compliance with treatment and medications, good tolerability, and trying to follow lower cholesterol diet.  Pt denies worsening depressive symptoms, suicidal ideation or panic. No fever, wt loss, night sweats, loss of appetite, or other constitutional symptoms.  Pt states good ability with ADL's, low fall risk, home safety reviewed and adequate, no significant changes in hearing or vision, and occasionally active with exercise.  No acute complaints Past Medical History  Diagnosis Date  . ALLERGIC RHINITIS 04/09/2007  . BACTEREMIA, 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  . BENIGN PROSTATIC HYPERTROPHY 04/09/2007  . BRADYCARDIA 05/11/2009  . COLONIC POLYPS, HX OF 04/09/2007    ADENOMATOUS POLYPS 4098,1191  . COPD 04/09/2007  . Cough 03/27/2008  . DEPRESSION 04/09/2007  . DIABETES MELLITUS, TYPE II 10/09/2007  . DIVERTICULOSIS, COLON 04/09/2007  . DIZZINESS 10/06/2008  . GLUCOSE INTOLERANCE 04/09/2007  . HYPERLIPIDEMIA 03/27/2007  . HYPERTENSION 03/27/2007  . PERIPHERAL VASCULAR DISEASE 03/27/2007  . PNEUMONIA ORGANISM NOS 11/08/2007  . SHINGLES, HX OF 04/09/2007  . SHOULDER PAIN, BILATERAL 08/01/2007  . Blood transfusion without reported diagnosis     really not sure thinks 30-40 years ago  . Ulcer     40 years ago   Past Surgical History  Procedure Date  . Carotid endarterectomy 5/08    left  . Appendectomy   . S/p pud surgury     ? partial gastrectomy  . Carpal tunnel release     bilateral  . Esophagogastroduodenoscopy 06/07/2004  . Colonoscopy   . Hemorrhoid  banding 1990s    reports that he has quit smoking. He has never used smokeless tobacco. He reports that he does not drink alcohol or use illicit drugs. family history includes Heart disease in his other and Stroke in his other.  There is no history of Colon cancer. Allergies  Allergen Reactions  . Ace Inhibitors     REACTION: cough   Current Outpatient Prescriptions on File Prior to Visit  Medication Sig Dispense Refill  . amLODipine (NORVASC) 10 MG tablet TAKE ONE TABLET BY MOUTH EVERY DAY  60 tablet  1  . aspirin 325 MG tablet Take 325 mg by mouth daily.        . Coenzyme Q10 (COQ-10) 200 MG CAPS Take by mouth daily.        . CRESTOR 20 MG tablet TAKE ONE TABLET BY MOUTH AT BEDTIME  60 each  4  . losartan-hydrochlorothiazide (HYZAAR) 100-25 MG per tablet TAKE ONE TABLET BY MOUTH EVERY DAY  90 tablet  1  . metoprolol (TOPROL-XL) 50 MG 24 hr tablet TAKE ONE TABLET BY MOUTH EVERY DAY  60 tablet  6  . omeprazole (PRILOSEC) 20 MG capsule Take 1 capsule (20 mg total) by mouth daily. TAKE ONE CAPSULE BY MOUTH TWICE DAILY  120 capsule  11  . psyllium (METAMUCIL) 58.6 % powder 2 caps every day        Review of Systems Review of Systems  Constitutional: Negative for diaphoresis, activity change, appetite change and unexpected weight change.  HENT: Negative for hearing loss, ear pain, facial  swelling, mouth sores and neck stiffness.   Eyes: Negative for pain, redness and visual disturbance.  Respiratory: Negative for shortness of breath and wheezing.   Cardiovascular: Negative for chest pain and palpitations.  Gastrointestinal: Negative for diarrhea, blood in stool, abdominal distention and rectal pain.  Genitourinary: Negative for hematuria, flank pain and decreased urine volume.  Musculoskeletal: Negative for myalgias and joint swelling.  Skin: Negative for color change and wound.  Neurological: Negative for syncope and numbness.  Hematological: Negative for adenopathy.   Psychiatric/Behavioral: Negative for hallucinations, self-injury, decreased concentration and agitation.      Objective:   Physical Exam BP 108/70  Pulse 68  Temp 96.8 F (36 C) (Oral)  Ht 6\' 1"  (1.854 m)  Wt 201 lb (91.173 kg)  BMI 26.52 kg/m2  SpO2 94% Physical Exam  VS noted Constitutional: Pt is oriented to person, place, and time. Appears well-developed and well-nourished.  HENT:  Head: Normocephalic and atraumatic.  Right Ear: External ear normal.  Left Ear: External ear normal.  Nose: Nose normal.  Mouth/Throat: Oropharynx is clear and moist.  Eyes: Conjunctivae and EOM are normal. Pupils are equal, round, and reactive to light.  Neck: Normal range of motion. Neck supple. No JVD present. No tracheal deviation present.  Cardiovascular: Normal rate, regular rhythm, normal heart sounds and intact distal pulses.   Pulmonary/Chest: Effort normal and breath sounds normal.  Abdominal: Soft. Bowel sounds are normal. There is no tenderness.  Musculoskeletal: Normal range of motion. Exhibits no edema.  Lymphadenopathy:  Has no cervical adenopathy.  Neurological: Pt is alert and oriented to person, place, and time. Pt has normal reflexes. No cranial nerve deficit.  Skin: Skin is warm and dry. No rash noted.  Psychiatric:  Has  normal mood and affect. Behavior is normal.     Assessment & Plan:

## 2012-05-22 NOTE — Patient Instructions (Addendum)
Continue all other medications as before Please have the pharmacy call with any other refills you may need. Your prilosec refill was done as requested today Please continue your efforts at being more active, low cholesterol diet, and weight control. Please remember to sign up for My Chart at your earliest convenience, as this will be important to you in the future with finding out test results. Please return in 6 mo with Lab testing done 3-5 days before

## 2012-05-22 NOTE — Telephone Encounter (Signed)
rx is correct at bid, pt wanted 2 mo supply as per his preference

## 2012-05-24 ENCOUNTER — Encounter: Payer: Self-pay | Admitting: Gastroenterology

## 2012-06-01 ENCOUNTER — Encounter: Payer: Self-pay | Admitting: Cardiology

## 2012-06-05 ENCOUNTER — Telehealth: Payer: Self-pay | Admitting: Internal Medicine

## 2012-06-05 MED ORDER — OMEPRAZOLE 20 MG PO CPDR: 40.0000 mg | DELAYED_RELEASE_CAPSULE | Freq: Every day | ORAL | Status: DC

## 2012-06-05 NOTE — Telephone Encounter (Signed)
rx re-done - done erx for 3 mo supply this time  The pharmacy apparently does not like the pt preference of 2 mo supply at a time and keeps questioning this  Zella Ball to let pt know

## 2012-06-05 NOTE — Telephone Encounter (Signed)
Pharmacy is calling again to clarify the directions on the omeprazole that was sent in 05/22/12, please call pharmacy and ask for [facility]

## 2012-06-12 ENCOUNTER — Other Ambulatory Visit: Payer: Self-pay | Admitting: Internal Medicine

## 2012-06-28 ENCOUNTER — Other Ambulatory Visit: Payer: Self-pay | Admitting: Internal Medicine

## 2012-07-05 ENCOUNTER — Other Ambulatory Visit: Payer: Self-pay | Admitting: Internal Medicine

## 2012-07-30 ENCOUNTER — Other Ambulatory Visit: Payer: Self-pay | Admitting: Internal Medicine

## 2012-08-29 ENCOUNTER — Encounter: Payer: Self-pay | Admitting: Vascular Surgery

## 2012-08-30 ENCOUNTER — Encounter: Payer: Self-pay | Admitting: Vascular Surgery

## 2012-08-30 ENCOUNTER — Other Ambulatory Visit (INDEPENDENT_AMBULATORY_CARE_PROVIDER_SITE_OTHER): Payer: Medicare Other | Admitting: *Deleted

## 2012-08-30 ENCOUNTER — Ambulatory Visit (INDEPENDENT_AMBULATORY_CARE_PROVIDER_SITE_OTHER): Payer: Medicare Other | Admitting: Vascular Surgery

## 2012-08-30 DIAGNOSIS — I6529 Occlusion and stenosis of unspecified carotid artery: Secondary | ICD-10-CM

## 2012-08-30 DIAGNOSIS — Z48812 Encounter for surgical aftercare following surgery on the circulatory system: Secondary | ICD-10-CM

## 2012-08-30 NOTE — Progress Notes (Signed)
[facility] HISTORY AND PHYSICAL   History of Present Illness:  Patient is a 79 y.o. year old male who presents for follow-up evaluation for carotid stenosis.  He previously had left carotid endarterectomy by Dr. Madilyn Fireman in February of 2001. He has been asymptomatic. He is on Aspirin for antiplatelet therapy.  His atherosclerotic risk factors remain elevated cholesterol, hypertension.  These are all currently stable and followed by his primary care physician.  He denies any new neurologic events including amaurosis, numbness, or weakness. No exertional fatigue left arm  Past Medical History  Diagnosis Date  . ALLERGIC RHINITIS 04/09/2007  . BACTEREMIA, 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  . BENIGN PROSTATIC HYPERTROPHY 04/09/2007  . BRADYCARDIA 05/11/2009  . COLONIC POLYPS, HX OF 04/09/2007    ADENOMATOUS POLYPS 1610,9604  . COPD 04/09/2007  . Cough 03/27/2008  . DEPRESSION 04/09/2007  . DIABETES MELLITUS, TYPE II 10/09/2007  . DIVERTICULOSIS, COLON 04/09/2007  . DIZZINESS 10/06/2008  . GLUCOSE INTOLERANCE 04/09/2007  . HYPERLIPIDEMIA 03/27/2007  . HYPERTENSION 03/27/2007  . PERIPHERAL VASCULAR DISEASE 03/27/2007  . PNEUMONIA ORGANISM NOS 11/08/2007  . SHINGLES, HX OF 04/09/2007  . SHOULDER PAIN, BILATERAL 08/01/2007  . Blood transfusion without reported diagnosis     really not sure thinks 30-40 years ago  . Ulcer     40 years ago    Past Surgical History  Procedure Laterality Date  . Carotid endarterectomy  5/08    left  . Appendectomy    . S/p pud surgury      ? partial gastrectomy  . Carpal tunnel release      bilateral  . Esophagogastroduodenoscopy  06/07/2004  . Colonoscopy    . Hemorrhoid banding  1990s    Review of Systems:  Neurologic: as above Cardiac:denies shortness of breath or chest pain Pulmonary: denies cough or wheeze  Social History History  Substance Use Topics  . Smoking status: Former Games developer  . Smokeless tobacco:  Never Used     Comment: quit 2/01  . Alcohol Use: No    Allergies  Allergies  Allergen Reactions  . Ace Inhibitors     REACTION: cough     Current Outpatient Prescriptions  Medication Sig Dispense Refill  . amLODipine (NORVASC) 10 MG tablet TAKE ONE TABLET BY MOUTH EVERY DAY  60 tablet  6  . aspirin 325 MG tablet Take 325 mg by mouth daily.        . Coenzyme Q10 (COQ-10) 200 MG CAPS Take by mouth daily.        . CRESTOR 20 MG tablet TAKE ONE TABLET BY MOUTH AT BEDTIME  30 tablet  5  . losartan-hydrochlorothiazide (HYZAAR) 100-25 MG per tablet TAKE ONE TABLET BY MOUTH EVERY DAY  90 tablet  3  . metoprolol succinate (TOPROL-XL) 50 MG 24 hr tablet TAKE ONE TABLET BY MOUTH EVERY DAY  60 tablet  6  . omeprazole (PRILOSEC) 20 MG capsule Take 2 capsules (40 mg total) by mouth daily. TAKE ONE CAPSULE BY MOUTH TWICE DAILY  180 capsule  3  . psyllium (METAMUCIL) 58.6 % powder 2 caps every day        No current facility-administered medications for this visit.    Physical Examination  Filed Vitals:   08/30/12 1604 08/30/12 1607  BP: 158/71 127/73  Pulse: 58   Height: 6\' 1"  (1.854 m)   Weight: 206 lb 4.8 oz (93.577 kg)   SpO2: 97%     Body mass index is 27.22  kg/(m^2).  General:  Alert and oriented, no acute distress HEENT: Normal Neck: No bruit or JVD Pulmonary: Clear to auscultation bilaterally Cardiac: Regular Rate and Rhythm without murmur Neurologic: Upper and lower extremity motor 5/5 and symmetric  DATA: Patient had a bilateral carotid duplex scan today which I reviewed and interpreted. He has less than 40% right internal carotid artery stenosis. Left internal carotid artery is widely patent. There was evidence of some left subclavian stenosis.  ASSESSMENT: Asymptomatic left subclavian stenosis. Patent carotid endarterectomy with mild right internal carotid artery stenosis. Also asymptomatic.   PLAN:  Patient was advised to shows his blood pressure checked in the right  arm since there is a 10 mm gradient between left and right. He'll continue to take his aspirin for stroke prophylaxis. He will have a followup carotid duplex exam in one year.  Fabienne Bruns, MD [facility] [place] Office: [phone] Pager: [phone]

## 2012-08-31 NOTE — Addendum Note (Signed)
Addended by: Adria Dill L on: 08/31/2012 09:50 AM   Modules accepted: Orders

## 2012-11-14 ENCOUNTER — Encounter: Payer: Self-pay | Admitting: Internal Medicine

## 2012-11-14 ENCOUNTER — Other Ambulatory Visit (INDEPENDENT_AMBULATORY_CARE_PROVIDER_SITE_OTHER): Payer: Medicare Other

## 2012-11-14 DIAGNOSIS — E119 Type 2 diabetes mellitus without complications: Secondary | ICD-10-CM

## 2012-11-14 LAB — BASIC METABOLIC PANEL
BUN: 24 mg/dL — ABNORMAL HIGH (ref 6–23)
Chloride: 102 mEq/L (ref 96–112)
Creatinine, Ser: 1.2 mg/dL (ref 0.4–1.5)
Glucose, Bld: 134 mg/dL — ABNORMAL HIGH (ref 70–99)
Potassium: 4 mEq/L (ref 3.5–5.1)

## 2012-11-14 LAB — LIPID PANEL
Cholesterol: 123 mg/dL (ref 0–200)
LDL Cholesterol: 64 mg/dL (ref 0–99)
Triglycerides: 99 mg/dL (ref 0.0–149.0)

## 2012-11-20 ENCOUNTER — Ambulatory Visit (INDEPENDENT_AMBULATORY_CARE_PROVIDER_SITE_OTHER): Payer: Medicare Other | Admitting: Internal Medicine

## 2012-11-20 ENCOUNTER — Encounter: Payer: Self-pay | Admitting: Internal Medicine

## 2012-11-20 VITALS — BP 102/78 | HR 50 | Temp 97.9°F | Ht 73.0 in | Wt 204.0 lb

## 2012-11-20 DIAGNOSIS — Z0000 Encounter for general adult medical examination without abnormal findings: Secondary | ICD-10-CM

## 2012-11-20 DIAGNOSIS — E785 Hyperlipidemia, unspecified: Secondary | ICD-10-CM

## 2012-11-20 DIAGNOSIS — E119 Type 2 diabetes mellitus without complications: Secondary | ICD-10-CM

## 2012-11-20 DIAGNOSIS — I10 Essential (primary) hypertension: Secondary | ICD-10-CM

## 2012-11-20 NOTE — Patient Instructions (Signed)
Please continue all other medications as before, and refills have been done if requested. Please have the pharmacy call with any other refills you may need.  Please continue your efforts at being more active, low cholesterol diet, and weight control.  Please remember to sign up for MyChart if you have not done so, as this will be important to you in the future with finding out test results, communicating by private email, and scheduling acute appointments online when needed.  Please return in 6 months, or sooner if needed, with Lab testing done 3-5 days before  

## 2012-11-20 NOTE — Progress Notes (Signed)
Subjective:    Patient ID: Daniel Woodard, male    DOB: [birth year], 79 y.o.   MRN: [id number]  HPI    Here to f/u; overall doing ok,  Pt denies chest pain, increased sob or doe, wheezing, orthopnea, PND, increased LE swelling, palpitations, dizziness or syncope.  Pt denies polydipsia, polyuria, or low sugar symptoms such as weakness or confusion improved with po intake.  Pt denies new neurological symptoms such as new headache, or facial or extremity weakness or numbness.   Pt states overall good compliance with meds, has been trying to follow lower cholesterol, diabetic diet, with wt overall stable,  but little exercise however. No acute complaints Past Medical History  Diagnosis Date  . ALLERGIC RHINITIS 04/09/2007  . BACTEREMIA, 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  . BENIGN PROSTATIC HYPERTROPHY 04/09/2007  . BRADYCARDIA 05/11/2009  . COLONIC POLYPS, HX OF 04/09/2007    ADENOMATOUS POLYPS 4098,1191  . COPD 04/09/2007  . Cough 03/27/2008  . DEPRESSION 04/09/2007  . DIABETES MELLITUS, TYPE II 10/09/2007  . DIVERTICULOSIS, COLON 04/09/2007  . DIZZINESS 10/06/2008  . GLUCOSE INTOLERANCE 04/09/2007  . HYPERLIPIDEMIA 03/27/2007  . HYPERTENSION 03/27/2007  . PERIPHERAL VASCULAR DISEASE 03/27/2007  . PNEUMONIA ORGANISM NOS 11/08/2007  . SHINGLES, HX OF 04/09/2007  . SHOULDER PAIN, BILATERAL 08/01/2007  . Blood transfusion without reported diagnosis     really not sure thinks 30-40 years ago  . Ulcer     40 years ago   Past Surgical History  Procedure Laterality Date  . Carotid endarterectomy  5/08    left  . Appendectomy    . S/p pud surgury      ? partial gastrectomy  . Carpal tunnel release      bilateral  . Esophagogastroduodenoscopy  06/07/2004  . Colonoscopy    . Hemorrhoid banding  1990s    reports that he has quit smoking. He has never used smokeless tobacco. He reports that he does not drink alcohol or use illicit drugs. family history includes Heart disease in his other  and Stroke in his other.  There is no history of Colon cancer. Allergies  Allergen Reactions  . Ace Inhibitors     REACTION: cough   Current Outpatient Prescriptions on File Prior to Visit  Medication Sig Dispense Refill  . amLODipine (NORVASC) 10 MG tablet TAKE ONE TABLET BY MOUTH EVERY DAY  60 tablet  6  . aspirin 325 MG tablet Take 325 mg by mouth daily.        . Coenzyme Q10 (COQ-10) 200 MG CAPS Take by mouth daily.        . CRESTOR 20 MG tablet TAKE ONE TABLET BY MOUTH AT BEDTIME  30 tablet  5  . losartan-hydrochlorothiazide (HYZAAR) 100-25 MG per tablet TAKE ONE TABLET BY MOUTH EVERY DAY  90 tablet  3  . metoprolol succinate (TOPROL-XL) 50 MG 24 hr tablet TAKE ONE TABLET BY MOUTH EVERY DAY  60 tablet  6  . omeprazole (PRILOSEC) 20 MG capsule Take 2 capsules (40 mg total) by mouth daily. TAKE ONE CAPSULE BY MOUTH TWICE DAILY  180 capsule  3  . psyllium (METAMUCIL) 58.6 % powder 2 caps every day        No current facility-administered medications on file prior to visit.   Review of Systems  Constitutional: Negative for unexpected weight change, or unusual diaphoresis  HENT: Negative for tinnitus.   Eyes: Negative for photophobia and visual disturbance.  Respiratory: Negative for choking and stridor.  Gastrointestinal: Negative for vomiting and blood in stool.  Genitourinary: Negative for hematuria and decreased urine volume.  Musculoskeletal: Negative for acute joint swelling Skin: Negative for color change and wound.  Neurological: Negative for tremors and numbness other than noted  Psychiatric/Behavioral: Negative for decreased concentration or  hyperactivity.       Objective:   Physical Exam BP 102/78  Pulse 50  Temp(Src) 97.9 F (36.6 C) (Oral)  Ht 6\' 1"  (1.854 m)  Wt 204 lb (92.534 kg)  BMI 26.92 kg/m2  SpO2 93% VS noted,  Constitutional: Pt appears well-developed and well-nourished.  HENT: Head: NCAT.  Right Ear: External ear normal.  Left Ear: External ear  normal.  Eyes: Conjunctivae and EOM are normal. Pupils are equal, round, and reactive to light.  Neck: Normal range of motion. Neck supple.  Cardiovascular: Normal rate and regular rhythm.   Pulmonary/Chest: Effort normal and breath sounds normal.  Abd:  Soft, NT, non-distended, + BS Neurological: Pt is alert. Not confused  Skin: Skin is warm. No erythema.  Psychiatric: Pt behavior is normal. Thought content normal.     Assessment & Plan:

## 2012-11-21 NOTE — Assessment & Plan Note (Signed)
stable overall by history and exam, recent data reviewed with pt, and pt to continue medical treatment as before,  to f/u any worsening symptoms or concerns BP Readings from Last 3 Encounters:  11/20/12 102/78  08/30/12 127/73  05/22/12 108/70

## 2012-11-21 NOTE — Assessment & Plan Note (Signed)
stable overall by history and exam, recent data reviewed with pt, and pt to continue medical treatment as before,  to f/u any worsening symptoms or concerns Lab Results  Component Value Date   LDLCALC 64 11/14/2012

## 2012-11-21 NOTE — Assessment & Plan Note (Signed)
stable overall by history and exam, recent data reviewed with pt, and pt to continue medical treatment as before,  to f/u any worsening symptoms or concerns Lab Results  Component Value Date   HGBA1C 6.5 11/14/2012

## 2012-12-20 ENCOUNTER — Encounter: Payer: Self-pay | Admitting: Internal Medicine

## 2012-12-20 ENCOUNTER — Ambulatory Visit (INDEPENDENT_AMBULATORY_CARE_PROVIDER_SITE_OTHER): Payer: Medicare Other | Admitting: Internal Medicine

## 2012-12-20 VITALS — BP 140/88 | HR 56 | Temp 98.2°F | Wt 205.0 lb

## 2012-12-20 DIAGNOSIS — I498 Other specified cardiac arrhythmias: Secondary | ICD-10-CM

## 2012-12-20 DIAGNOSIS — R001 Bradycardia, unspecified: Secondary | ICD-10-CM

## 2012-12-20 DIAGNOSIS — I10 Essential (primary) hypertension: Secondary | ICD-10-CM

## 2012-12-20 MED ORDER — METOPROLOL SUCCINATE ER 25 MG PO TB24: 25.0000 mg | ORAL_TABLET | Freq: Every day | ORAL | Status: DC

## 2012-12-20 NOTE — Patient Instructions (Addendum)
Bradycardia Bradycardia is a term for a heart rate (pulse) that, in adults, is slower than 60 beats per minute. A normal rate is 60 to 100 beats per minute. A heart rate below 60 beats per minute may be normal for some adults with healthy hearts. If the rate is too slow, the heart may have trouble pumping the volume of blood the body needs. If the heart rate gets too low, blood flow to the brain may be decreased and may make you feel lightheaded, dizzy, or faint. The heart has a natural pacemaker in the top of the heart called the SA node (sinoatrial or sinus node). This pacemaker sends out regular electrical signals to the muscle of the heart, telling the heart muscle when to beat (contract). The electrical signal travels from the upper parts of the heart (atria) through the AV node (atrioventricular node), to the lower chambers of the heart (ventricles). The ventricles squeeze, pumping the blood from your heart to your lungs and to the rest of your body. CAUSES   Problem with the heart's electrical system.  Problem with the heart's natural pacemaker.  Heart disease, damage, or infection.  Medications.  Problems with minerals and salts (electrolytes). SYMPTOMS   Fainting (syncope).  Fatigue and weakness.  Shortness of breath (dyspnea).  Chest pain (angina).  Drowsiness.  Confusion. DIAGNOSIS   An electrocardiogram (ECG) can help your caregiver determine the type of slow heart rate you have.  If the cause is not seen on an ECG, you may need to wear a heart monitor that records your heart rhythm for several hours or days.  Blood tests. TREATMENT   Electrolyte supplements.  Medications.  Withholding medication which is causing a slow heart rate.  Pacemaker placement. SEEK IMMEDIATE MEDICAL CARE IF:   You feel lightheaded or faint.  You develop an irregular heart rate.  You feel chest pain or have trouble breathing. MAKE SURE YOU:   Understand these  instructions.  Will watch your condition.  Will get help right away if you are not doing well or get worse. Document Released: 02/05/2002 Document Revised: 08/08/2011 Document Reviewed: 01/02/2008 [employer] Patient Information 2014 [employer].  

## 2012-12-20 NOTE — Assessment & Plan Note (Signed)
Will decrease Toprol to 25 mg daily RTC in 1 month to reassess

## 2012-12-20 NOTE — Assessment & Plan Note (Signed)
Well controlled. Continue current meds

## 2012-12-20 NOTE — Progress Notes (Signed)
Subjective:    Patient ID: Daniel Woodard, male    DOB: 07/14/1929, 79 y.o.   MRN: [id number]  HPI  Pt presents to he clinic today with c/o low HR. This has been a gradual problem. It seems to have climaxed on Sunday. He took his BP and pulse and his pulse was 48. He did feel dizzy but denies chest pain, chest tightness or shortness of breath. He does have a history of HTN. He is on Norvasc, Hyzaar and toprol. He feels like he would feel better if his HR was a little higher. He does not have and history of CAD r MI that he is aware of.  Review of Systems      Past Medical History  Diagnosis Date  . ALLERGIC RHINITIS 04/09/2007  . BACTEREMIA, 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  . BENIGN PROSTATIC HYPERTROPHY 04/09/2007  . BRADYCARDIA 05/11/2009  . COLONIC POLYPS, HX OF 04/09/2007    ADENOMATOUS POLYPS 2004,2008  . COPD 04/09/2007  . Cough 03/27/2008  . DEPRESSION 04/09/2007  . DIABETES MELLITUS, TYPE II 10/09/2007  . DIVERTICULOSIS, COLON 04/09/2007  . DIZZINESS 10/06/2008  . GLUCOSE INTOLERANCE 04/09/2007  . HYPERLIPIDEMIA 03/27/2007  . HYPERTENSION 03/27/2007  . PERIPHERAL VASCULAR DISEASE 03/27/2007  . PNEUMONIA ORGANISM NOS 11/08/2007  . SHINGLES, HX OF 04/09/2007  . SHOULDER PAIN, BILATERAL 08/01/2007  . Blood transfusion without reported diagnosis     really not sure thinks 30-40 years ago  . Ulcer     40  years ago    Current Outpatient Prescriptions  Medication Sig Dispense Refill  . amLODipine (NORVASC) 10 MG tablet TAKE ONE TABLET BY MOUTH EVERY DAY  60 tablet  6  . aspirin 325 MG tablet Take 325 mg by mouth daily.        . Coenzyme Q10 (COQ-10) 200 MG CAPS Take by mouth daily.        . CRESTOR 20 MG tablet TAKE ONE TABLET BY MOUTH AT BEDTIME  30 tablet  5  . losartan-hydrochlorothiazide (HYZAAR) 100-25 MG per tablet TAKE ONE TABLET BY MOUTH EVERY DAY  90 tablet  3  . metoprolol succinate (TOPROL-XL) 50 MG 24 hr tablet TAKE ONE TABLET BY MOUTH EVERY DAY  60  tablet  6  . omeprazole (PRILOSEC) 20 MG capsule Take 2 capsules (40 mg total) by mouth daily. TAKE ONE CAPSULE BY MOUTH TWICE DAILY  180 capsule  3  . psyllium (METAMUCIL) 58.6 % powder 2 caps every day        No current facility-administered medications for this visit.    Allergies  Allergen Reactions  . Ace Inhibitors     REACTION: cough    Family History  Problem Relation Age of Onset  . Heart disease Other     Cardiovascular disorder, CHF  . Stroke Other     1st degree relative male and male  . Colon cancer Neg Hx     History   Social History  . Marital Status: Married    Spouse Name: N/A    Number of Children: 2  . Years of Education: N/A   Occupational History  . 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 Smoking status: Former Games developer  . Smokeless tobacco: Never Used     Comment: quit 2/01  . Alcohol Use: No  . Drug Use: No  . Sexually Active: Not on file   Other Topics Concern  . Not on file   Social History Narrative  Daily caffeine      Constitutional: Denies fever, malaise, fatigue, headache or abrupt weight changes.  Respiratory: Denies difficulty breathing, shortness of breath, cough or sputum production.   Cardiovascular: Denies chest pain, chest tightness, palpitations or swelling in the hands or feet.   Neurological: Pt reports dizziness. Denies  difficulty with memory, difficulty with speech or problems with balance and coordination.   No other specific complaints in a complete review of systems (except as listed in HPI above).  Objective:   Physical Exam   BP 140/88  Pulse 56  Temp(Src) 98.2 F (36.8 C) (Oral)  Wt 205 lb (92.987 kg)  BMI 27.05 kg/m2  SpO2 95% Wt Readings from Last 3 Encounters:  12/20/12 205 lb (92.987 kg)  11/20/12 204 lb (92.534 kg)  08/30/12 206 lb 4.8 oz (93.577 kg)    General: Appears his stated age, well developed, well nourished in NAD.  Cardiovascular: Bradycardic  with normal rhythm. S1,S2 noted.  No murmur, rubs or gallops noted. No JVD or BLE edema. No carotid bruits noted. Pulmonary/Chest: Normal effort and positive vesicular breath sounds. No respiratory distress. No wheezes, rales or ronchi noted.  Neurological: Alert and oriented. Cranial nerves II-XII intact. Coordination normal. +DTRs bilaterally.   BMET    Component Value Date/Time   NA 139 11/14/2012 0731   K 4.0 11/14/2012 0731   CL 102 11/14/2012 0731   CO2 19 11/14/2012 0731   GLUCOSE 134* 11/14/2012 0731   BUN 24* 11/14/2012 0731   CREATININE 1.2 11/14/2012 0731   CALCIUM 8.6 11/14/2012 0731   GFRNONAA 59.89* 05/07/2010 0739   GFRAA 69 04/01/2008 0914    Lipid Panel     Component Value Date/Time   CHOL 123 11/14/2012 0731   TRIG 99.0 11/14/2012 0731   HDL 39.40 11/14/2012 0731   CHOLHDL 3 11/14/2012 0731   VLDL 19.8 11/14/2012 0731   LDLCALC 64 11/14/2012 0731    CBC    Component Value Date/Time   WBC 7.3 05/18/2012 0732   RBC 4.94 05/18/2012 0732   HGB 15.3 05/18/2012 0732   HCT 44.8 05/18/2012 0732   PLT 178.0 05/18/2012 0732   MCV 90.7 05/18/2012 0732   MCHC 34.1 05/18/2012 0732   RDW 13.6 05/18/2012 0732   LYMPHSABS 1.5 05/18/2012 0732   MONOABS 0.8 05/18/2012 0732   EOSABS 0.2 05/18/2012 0732   BASOSABS 0.0 05/18/2012 0732    Hgb A1C Lab Results  Component Value Date   HGBA1C 6.5 11/14/2012        Assessment & Plan:

## 2013-01-17 ENCOUNTER — Other Ambulatory Visit: Payer: Self-pay | Admitting: Internal Medicine

## 2013-01-25 ENCOUNTER — Other Ambulatory Visit: Payer: Self-pay | Admitting: Internal Medicine

## 2013-02-15 ENCOUNTER — Other Ambulatory Visit: Payer: Self-pay | Admitting: Internal Medicine

## 2013-03-05 ENCOUNTER — Ambulatory Visit: Payer: Medicare Other

## 2013-03-07 ENCOUNTER — Ambulatory Visit (INDEPENDENT_AMBULATORY_CARE_PROVIDER_SITE_OTHER): Payer: Medicare Other

## 2013-03-07 DIAGNOSIS — Z23 Encounter for immunization: Secondary | ICD-10-CM

## 2013-03-19 ENCOUNTER — Other Ambulatory Visit: Payer: Self-pay

## 2013-03-19 MED ORDER — METOPROLOL SUCCINATE ER 25 MG PO TB24: ORAL_TABLET | ORAL | Status: DC

## 2013-03-19 NOTE — Telephone Encounter (Signed)
Refill request done for toprol XL

## 2013-05-15 ENCOUNTER — Other Ambulatory Visit (INDEPENDENT_AMBULATORY_CARE_PROVIDER_SITE_OTHER): Payer: Medicare Other

## 2013-05-15 DIAGNOSIS — E119 Type 2 diabetes mellitus without complications: Secondary | ICD-10-CM

## 2013-05-15 DIAGNOSIS — Z0000 Encounter for general adult medical examination without abnormal findings: Secondary | ICD-10-CM

## 2013-05-15 LAB — LIPID PANEL
HDL: 32.2 mg/dL — ABNORMAL LOW (ref >39.00)
Triglycerides: 128 mg/dL (ref 0.0–149.0)
VLDL: 25.6 mg/dL (ref 0.0–40.0)

## 2013-05-15 LAB — URINALYSIS, ROUTINE W REFLEX MICROSCOPIC
Hgb urine dipstick: NEGATIVE
Leukocytes, UA: NEGATIVE
Nitrite: NEGATIVE
Specific Gravity, Urine: >=1.03 — AB (ref 1.000–1.030)
Urine Glucose: NEGATIVE
Urobilinogen, UA: 0.2 (ref 0.0–1.0)

## 2013-05-15 LAB — HEPATIC FUNCTION PANEL
Albumin: 4.3 g/dL (ref 3.5–5.2)
Total Protein: 7.5 g/dL (ref 6.0–8.3)

## 2013-05-15 LAB — TSH: TSH: 4.79 u[IU]/mL (ref 0.35–5.50)

## 2013-05-15 LAB — BASIC METABOLIC PANEL
CO2: 27 mEq/L (ref 19–32)
Calcium: 8.7 mg/dL (ref 8.4–10.5)
Creatinine, Ser: 1.4 mg/dL (ref 0.4–1.5)
GFR: 51.24 mL/min — ABNORMAL LOW (ref >60.00)
Glucose, Bld: 116 mg/dL — ABNORMAL HIGH (ref 70–99)

## 2013-05-15 LAB — HEMOGLOBIN A1C: Hgb A1c MFr Bld: 6.7 % — ABNORMAL HIGH (ref 4.6–6.5)

## 2013-05-15 LAB — CBC WITH DIFFERENTIAL/PLATELET
Basophils Absolute: 0 10*3/uL (ref 0.0–0.1)
Eosinophils Relative: 4.6 % (ref 0.0–5.0)
HCT: 43.5 % (ref 39.0–52.0)
Lymphocytes Relative: 24.7 % (ref 12.0–46.0)
Lymphs Abs: 1.7 10*3/uL (ref 0.7–4.0)
MCV: 90.8 fl (ref 78.0–100.0)
Monocytes Absolute: 1 10*3/uL (ref 0.1–1.0)
Neutrophils Relative %: 56.1 % (ref 43.0–77.0)
Platelets: 177 10*3/uL (ref 150.0–400.0)
RBC: 4.79 Mil/uL (ref 4.22–5.81)
WBC: 6.8 10*3/uL (ref 4.5–10.5)

## 2013-05-15 LAB — MICROALBUMIN / CREATININE URINE RATIO: Microalb Creat Ratio: 0.8 mg/g (ref 0.0–30.0)

## 2013-05-21 ENCOUNTER — Encounter: Payer: Self-pay | Admitting: Internal Medicine

## 2013-05-21 ENCOUNTER — Ambulatory Visit (INDEPENDENT_AMBULATORY_CARE_PROVIDER_SITE_OTHER): Payer: Medicare Other | Admitting: Internal Medicine

## 2013-05-21 VITALS — BP 110/72 | HR 74 | Temp 97.6°F | Ht 73.0 in | Wt 206.0 lb

## 2013-05-21 DIAGNOSIS — Z23 Encounter for immunization: Secondary | ICD-10-CM

## 2013-05-21 DIAGNOSIS — E119 Type 2 diabetes mellitus without complications: Secondary | ICD-10-CM

## 2013-05-21 DIAGNOSIS — Z0000 Encounter for general adult medical examination without abnormal findings: Secondary | ICD-10-CM

## 2013-05-21 MED ORDER — METOPROLOL SUCCINATE ER 25 MG PO TB24: ORAL_TABLET | ORAL | Status: DC

## 2013-05-21 NOTE — Assessment & Plan Note (Signed)
stable overall by history and exam, recent data reviewed with pt, and pt to continue medical treatment as before,  to f/u any worsening symptoms or concerns le Lab Results  Component Value Date   HGBA1C 6.7* 05/15/2013

## 2013-05-21 NOTE — Addendum Note (Signed)
Addended by: Scharlene Gloss B on: 05/21/2013 10:45 AM   Modules accepted: Orders

## 2013-05-21 NOTE — Patient Instructions (Addendum)
You had the new Prevnar pneumonia shot today Please continue all other medications as before, and refills have been done if requested. Please have the pharmacy call with any other refills you may need. Please continue your efforts at being more active, low cholesterol diet, and weight control. You are otherwise up to date with prevention measures today.  Please remember to sign up for My Chart if you have not done so, as this will be important to you in the future with finding out test results, communicating by private email, and scheduling acute appointments online when needed.  Please return in 6 months, or sooner if needed, with Lab testing done 3-5 days before

## 2013-05-21 NOTE — Progress Notes (Signed)
Subjective:    Patient ID: Daniel Woodard, male    DOB: Aug 27, 1929, 79 y.o.   MRN: [id number]  HPI  Here for wellness and f/u;  Overall doing ok;  Pt denies CP, worsening SOB, DOE, wheezing, orthopnea, PND, worsening LE edema, palpitations, dizziness or syncope.  Pt denies neurological change such as new headache, facial or extremity weakness.  Pt denies polydipsia, polyuria, or low sugar symptoms. Pt states overall good compliance with treatment and medications, good tolerability, and has been trying to follow lower cholesterol diet.  Pt denies worsening depressive symptoms, suicidal ideation or panic. No fever, night sweats, wt loss, loss of appetite, or other constitutional symptoms.  Pt states good ability with ADL's, has low fall risk, home safety reviewed and adequate, no other significant changes in hearing or vision, and only occasionally active with exercise.  Has much improved since last visit with beta blocker reduction to 25 mg. No acute complaints Past Medical History  Diagnosis Date  . ALLERGIC RHINITIS 04/09/2007  . BACTEREMIA, 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  . BENIGN PROSTATIC HYPERTROPHY 04/09/2007  . BRADYCARDIA 05/11/2009  . COLONIC POLYPS, HX OF 04/09/2007    ADENOMATOUS POLYPS [id number]  . COPD 04/09/2007  . Cough 03/27/2008  . DEPRESSION 04/09/2007  . DIABETES MELLITUS, TYPE II 10/09/2007  . DIVERTICULOSIS, COLON 04/09/2007  . DIZZINESS 10/06/2008  . GLUCOSE INTOLERANCE 04/09/2007  . HYPERLIPIDEMIA 03/27/2007  . HYPERTENSION 03/27/2007  . PERIPHERAL VASCULAR DISEASE 03/27/2007  . PNEUMONIA ORGANISM NOS 11/08/2007  . SHINGLES, HX OF 04/09/2007  . SHOULDER PAIN, BILATERAL 08/01/2007  . Blood transfusion without reported diagnosis     really not sure thinks 30-40 years ago  . Ulcer     40 years ago   Past Surgical History  Procedure Laterality Date  . Carotid endarterectomy  5/08    left  . Appendectomy    . S/p pud surgury      ? partial gastrectomy  .  Carpal tunnel release      bilateral  . Esophagogastroduodenoscopy  06/07/2004  . Colonoscopy    . Hemorrhoid banding  1990s    reports that he has quit smoking. He has never used smokeless tobacco. He reports that he does not drink alcohol or use illicit drugs. family history includes Heart disease in his other; Stroke in his other. There is no history of Colon cancer. Allergies  Allergen Reactions  . Ace Inhibitors     REACTION: cough   Current Outpatient Prescriptions on File Prior to Visit  Medication Sig Dispense Refill  . amLODipine (NORVASC) 10 MG tablet TAKE ONE TABLET BY MOUTH EVERY DAY  60 tablet  6  . aspirin 325 MG tablet Take 325 mg by mouth daily.        . Coenzyme Q10 (COQ-10) 200 MG CAPS Take by mouth daily.        . CRESTOR 20 MG tablet TAKE ONE TABLET BY MOUTH AT BEDTIME  30 tablet  11  . losartan-hydrochlorothiazide (HYZAAR) 100-25 MG per tablet TAKE ONE TABLET BY MOUTH EVERY DAY  90 tablet  3  . omeprazole (PRILOSEC) 20 MG capsule Take 2 capsules (40 mg total) by mouth daily. TAKE ONE CAPSULE BY MOUTH TWICE DAILY  180 capsule  3  . psyllium (METAMUCIL) 58.6 % powder 2 caps every day        No current facility-administered medications on file prior to visit.   Review of Systems Constitutional: Negative for diaphoresis, activity change, appetite change or unexpected  weight change.  HENT: Negative for hearing loss, ear pain, facial swelling, mouth sores and neck stiffness.   Eyes: Negative for pain, redness and visual disturbance.  Respiratory: Negative for shortness of breath and wheezing.   Cardiovascular: Negative for chest pain and palpitations.  Gastrointestinal: Negative for diarrhea, blood in stool, abdominal distention or other pain Genitourinary: Negative for hematuria, flank pain or change in urine volume.  Musculoskeletal: Negative for myalgias and joint swelling.  Skin: Negative for color change and wound.  Neurological: Negative for syncope and numbness.  other than noted Hematological: Negative for adenopathy.  Psychiatric/Behavioral: Negative for hallucinations, self-injury, decreased concentration and agitation.      Objective:   Physical Exam BP 110/72  Pulse 74  Temp(Src) 97.6 F (36.4 C) (Oral)  Ht 6\' 1"  (1.854 m)  Wt 206 lb (93.441 kg)  BMI 27.18 kg/m2  SpO2 93% VS noted,  Constitutional: Pt is oriented to person, place, and time. Appears well-developed and well-nourished.  Head: Normocephalic and atraumatic.  Right Ear: External ear normal.  Left Ear: External ear normal.  Nose: Nose normal.  Mouth/Throat: Oropharynx is clear and moist.  Eyes: Conjunctivae and EOM are normal. Pupils are equal, round, and reactive to light.  Neck: Normal range of motion. Neck supple. No JVD present. No tracheal deviation present.  Cardiovascular: Normal rate, regular rhythm, normal heart sounds and intact distal pulses.   Pulmonary/Chest: Effort normal and breath sounds normal.  Abdominal: Soft. Bowel sounds are normal. There is no tenderness. No HSM  Musculoskeletal: Normal range of motion. Exhibits no edema.  Lymphadenopathy:  Has no cervical adenopathy.  Neurological: Pt is alert and oriented to person, place, and time. Pt has normal reflexes. No cranial nerve deficit.  Skin: Skin is warm and dry. No rash noted.  Psychiatric:  Has  normal mood and affect. Behavior is normal.     Assessment & Plan:

## 2013-05-21 NOTE — Assessment & Plan Note (Signed)

## 2013-06-10 ENCOUNTER — Other Ambulatory Visit: Payer: Self-pay | Admitting: Internal Medicine

## 2013-06-10 NOTE — Telephone Encounter (Signed)
Refill done.  

## 2013-06-17 ENCOUNTER — Other Ambulatory Visit: Payer: Self-pay | Admitting: Internal Medicine

## 2013-06-25 ENCOUNTER — Telehealth: Payer: Self-pay | Admitting: Internal Medicine

## 2013-06-25 NOTE — Telephone Encounter (Signed)
I am really not responsible for the price of the medication, and cannot really affect that, except to try to change to another medication that may be better covered under his plan.  Would he be able to check his drug coverage booklet he may have received from his insurance or check online to see if there is a preferred medication similar?  Or could try to ask the pharmacist as well

## 2013-06-25 NOTE — Telephone Encounter (Signed)
Pt request call from assistant [employer] about Omeprazole. Pt stated that the insurance only let him fill for 30 day supply. Pt was wondering if he can increase the dosage or Dr. Jenny Reichmann can do something so they will pay for this med. Please call pt

## 2013-06-26 MED ORDER — OMEPRAZOLE 20 MG PO CPDR: 20.0000 mg | DELAYED_RELEASE_CAPSULE | Freq: Every day | ORAL | Status: DC

## 2013-06-26 NOTE — Telephone Encounter (Signed)
Robin to see below and ok to send rx

## 2013-06-26 NOTE — Telephone Encounter (Signed)
Ok to try, but if does not work well, we can add qhs zantac, or try change to another generic such as protonix or prevacid

## 2013-06-26 NOTE — Telephone Encounter (Signed)
Patient informed and he would like PCP to change the omeprazole to 20 mg ONCE DAILY, he thinks that would work not only for insurance but for him as well.  Please send to [facility] on [address]

## 2013-07-02 ENCOUNTER — Other Ambulatory Visit: Payer: Self-pay | Admitting: Vascular Surgery

## 2013-07-02 DIAGNOSIS — I6529 Occlusion and stenosis of unspecified carotid artery: Secondary | ICD-10-CM

## 2013-07-09 ENCOUNTER — Inpatient Hospital Stay (HOSPITAL_COMMUNITY)
Admission: EM | Admit: 2013-07-09 | Discharge: 2013-07-12 | DRG: 244 | Disposition: A | Discharge status: Home / Self Care | Payer: Medicare Other | Attending: Internal Medicine | Admitting: Internal Medicine

## 2013-07-09 ENCOUNTER — Emergency Department (HOSPITAL_COMMUNITY): Payer: Medicare Other

## 2013-07-09 ENCOUNTER — Encounter (HOSPITAL_COMMUNITY): Payer: Self-pay | Admitting: Emergency Medicine

## 2013-07-09 DIAGNOSIS — F329 Major depressive disorder, single episode, unspecified: Secondary | ICD-10-CM | POA: Diagnosis present

## 2013-07-09 DIAGNOSIS — F3289 Other specified depressive episodes: Secondary | ICD-10-CM | POA: Diagnosis present

## 2013-07-09 DIAGNOSIS — J4489 Other specified chronic obstructive pulmonary disease: Secondary | ICD-10-CM | POA: Diagnosis present

## 2013-07-09 DIAGNOSIS — E785 Hyperlipidemia, unspecified: Secondary | ICD-10-CM | POA: Diagnosis present

## 2013-07-09 DIAGNOSIS — Z87891 Personal history of nicotine dependence: Secondary | ICD-10-CM

## 2013-07-09 DIAGNOSIS — I441 Atrioventricular block, second degree: Principal | ICD-10-CM | POA: Diagnosis present

## 2013-07-09 DIAGNOSIS — J449 Chronic obstructive pulmonary disease, unspecified: Secondary | ICD-10-CM

## 2013-07-09 DIAGNOSIS — E119 Type 2 diabetes mellitus without complications: Secondary | ICD-10-CM | POA: Diagnosis present

## 2013-07-09 DIAGNOSIS — R001 Bradycardia, unspecified: Secondary | ICD-10-CM

## 2013-07-09 DIAGNOSIS — Z7982 Long term (current) use of aspirin: Secondary | ICD-10-CM

## 2013-07-09 DIAGNOSIS — I10 Essential (primary) hypertension: Secondary | ICD-10-CM | POA: Diagnosis present

## 2013-07-09 DIAGNOSIS — I739 Peripheral vascular disease, unspecified: Secondary | ICD-10-CM | POA: Diagnosis present

## 2013-07-09 DIAGNOSIS — N400 Enlarged prostate without lower urinary tract symptoms: Secondary | ICD-10-CM | POA: Diagnosis present

## 2013-07-09 HISTORY — DX: Atrioventricular block, complete: I44.2

## 2013-07-09 LAB — TROPONIN I: Troponin I: <0.3 ng/mL (ref <0.30)

## 2013-07-09 LAB — CBC
HEMATOCRIT: 43.2 % (ref 39.0–52.0)
Hemoglobin: 15.2 g/dL (ref 13.0–17.0)
MCH: 30.8 pg (ref 26.0–34.0)
MCHC: 35.2 g/dL (ref 30.0–36.0)
MCV: 87.6 fL (ref 78.0–100.0)
PLATELETS: 163 10*3/uL (ref 150–400)
RBC: 4.93 MIL/uL (ref 4.22–5.81)
RDW: 12.7 % (ref 11.5–15.5)
WBC: 10.6 10*3/uL — AB (ref 4.0–10.5)

## 2013-07-09 LAB — BASIC METABOLIC PANEL
BUN: 23 mg/dL (ref 6–23)
CHLORIDE: 103 meq/L (ref 96–112)
CO2: 23 meq/L (ref 19–32)
Calcium: 9 mg/dL (ref 8.4–10.5)
Creatinine, Ser: 1.35 mg/dL (ref 0.50–1.35)
GFR calc Af Amer: 55 mL/min — ABNORMAL LOW (ref >90)
GFR calc non Af Amer: 48 mL/min — ABNORMAL LOW (ref >90)
Glucose, Bld: 181 mg/dL — ABNORMAL HIGH (ref 70–99)
Potassium: 4.7 mEq/L (ref 3.7–5.3)
Sodium: 140 mEq/L (ref 137–147)

## 2013-07-09 LAB — POCT I-STAT TROPONIN I: TROPONIN I, POC: 0.01 ng/mL (ref 0.00–0.08)

## 2013-07-09 LAB — MRSA PCR SCREENING: MRSA BY PCR: NEGATIVE

## 2013-07-09 LAB — GLUCOSE, CAPILLARY: Glucose-Capillary: 116 mg/dL — ABNORMAL HIGH (ref 70–99)

## 2013-07-09 MED ORDER — ONDANSETRON HCL 4 MG/2ML IJ SOLN: 4.0000 mg | Freq: Four times a day (QID) | INTRAMUSCULAR | Status: DC | PRN

## 2013-07-09 MED ORDER — ATROPINE SULFATE 0.4 MG/ML IJ SOLN
0.4000 mg | Freq: Once | INTRAMUSCULAR | Status: DC
  Filled 2013-07-09: qty 1

## 2013-07-09 MED ORDER — ACETAMINOPHEN 325 MG PO TABS: 650.0000 mg | ORAL_TABLET | ORAL | Status: DC | PRN

## 2013-07-09 MED ORDER — SODIUM CHLORIDE 0.9 % IV SOLN: 250.0000 mL | INTRAVENOUS | Status: DC | PRN

## 2013-07-09 MED ORDER — INSULIN ASPART 100 UNIT/ML ~~LOC~~ SOLN
0.0000 [IU] | Freq: Three times a day (TID) | SUBCUTANEOUS | Status: DC
  Administered 2013-07-12: 1 [IU] via SUBCUTANEOUS

## 2013-07-09 MED ORDER — SODIUM CHLORIDE 0.9 % IJ SOLN
3.0000 mL | Freq: Two times a day (BID) | INTRAMUSCULAR | Status: DC
  Administered 2013-07-09 – 2013-07-12 (×3): 3 mL via INTRAVENOUS

## 2013-07-09 MED ORDER — ATROPINE SULFATE 0.1 MG/ML IJ SOLN
INTRAMUSCULAR | Status: AC
Start: 2013-07-09 — End: 2013-07-09
  Filled 2013-07-09: qty 10

## 2013-07-09 MED ORDER — SODIUM CHLORIDE 0.9 % IJ SOLN: 3.0000 mL | INTRAMUSCULAR | Status: DC | PRN

## 2013-07-09 MED ORDER — ATROPINE SULFATE 0.1 MG/ML IJ SOLN
1.0000 mg | INTRAMUSCULAR | Status: DC | PRN
  Filled 2013-07-09: qty 10

## 2013-07-09 MED ORDER — PANTOPRAZOLE SODIUM 40 MG PO TBEC
40.0000 mg | DELAYED_RELEASE_TABLET | Freq: Every day | ORAL | Status: DC
Start: 2013-07-10 — End: 2013-07-12
  Administered 2013-07-10 – 2013-07-11 (×2): 40 mg via ORAL
  Filled 2013-07-09 (×2): qty 1

## 2013-07-09 MED ORDER — NITROGLYCERIN 0.4 MG SL SUBL: 0.4000 mg | SUBLINGUAL_TABLET | SUBLINGUAL | Status: DC | PRN

## 2013-07-09 NOTE — ED Notes (Signed)
Pt was reading paper and had episode of diaphoresis and pale

## 2013-07-09 NOTE — ED Notes (Signed)
EMS called for Dizzy and weakness, found pt in 40's, on arrival after bolus and trendelenberg hr in 70's pt was alert and oriented the entire time.

## 2013-07-09 NOTE — ED Notes (Signed)
Report given to Susanna, RN

## 2013-07-09 NOTE — Consult Note (Signed)
ELECTROPHYSIOLOGY H&P    Patient ID: Daniel Woodard MRN: [id number], DOB/AGE: 06/10/1929 80 y.o.  Admit date: 07/09/2013 Date of Consult: 07-09-2013   Primary Physician: Cathlean Cower, MD Primary Cardiologist: previously Dr Lyndel Safe  Reason for Consultation: bradycardia  HPI:  Daniel Woodard is a 80 y.o. male with a past medical history significant for hypertension, COPD, type II diabetes, and hyperlipidemia.  He states that for the last several weeks he has had intermittent dizzy spells.  Several months ago, he also had a frank syncopal spell that only lasted a few seconds.  This morning, he was sitting down reading the newspaper and became diaphoretic, nauseated with a general feeling of being unwell. He called 911.  Upon EMS arrival, he was found to be in heart block with a ventricular rate in the 30's.  He was brought to [facility] for further evaluation.    EP has been asked to evaluate for treatment options.  The patient is on Metoprolol XL 25mg  daily at home - last dose this morning.  He denies chest pain, shortness of breath, or palpitations.  He does report that his activity is limited by pain in his legs.   Lab work is unremarkable.  Last echo 2010 with normal EF, mild MR.    ROS is negative except as outlined above.   Past Medical History  Diagnosis Date  . ALLERGIC RHINITIS 04/09/2007  . BACTEREMIA, 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  . BENIGN PROSTATIC HYPERTROPHY 04/09/2007  . BRADYCARDIA 05/11/2009  . COLONIC POLYPS, HX OF 04/09/2007    ADENOMATOUS POLYPS [id number]  . COPD 04/09/2007  . Cough 03/27/2008  . DEPRESSION 04/09/2007  . DIABETES MELLITUS, TYPE II 10/09/2007  . DIVERTICULOSIS, COLON 04/09/2007  . DIZZINESS 10/06/2008  . GLUCOSE INTOLERANCE 04/09/2007  . HYPERLIPIDEMIA 03/27/2007  . HYPERTENSION 03/27/2007  . PERIPHERAL VASCULAR DISEASE 03/27/2007  . PNEUMONIA ORGANISM NOS 11/08/2007  . SHINGLES, HX OF 04/09/2007  . SHOULDER PAIN, BILATERAL 08/01/2007  .  Blood transfusion without reported diagnosis     really not sure thinks 30-40 years ago  . Ulcer     40 years ago     Surgical History:  Past Surgical History  Procedure Laterality Date  . Carotid endarterectomy  5/08    left  . Appendectomy    . S/p pud surgury      ? partial gastrectomy  . Carpal tunnel release      bilateral  . Esophagogastroduodenoscopy  06/07/2004  . Colonoscopy    . Hemorrhoid banding  1990s     Current outpatient prescriptions: amLODipine (NORVASC) 10 MG tablet, Take 10 mg by mouth daily., Disp: , Rfl: ;   aspirin 325 MG tablet, Take 325 mg by mouth daily.  , Disp: , Rfl: ;   Coenzyme Q10 (COQ-10) 200 MG CAPS, Take 200 mg by mouth daily. , Disp: , Rfl: ;  losartan-hydrochlorothiazide (HYZAAR) 100-25 MG per tablet, Take 1 tablet by mouth daily., Disp: , Rfl:  metoprolol succinate (TOPROL-XL) 25 MG 24 hr tablet, Take 25 mg by mouth daily., Disp: , Rfl: ;  omeprazole (PRILOSEC) 20 MG capsule, Take 20 mg by mouth daily., Disp: , Rfl: ;  psyllium (METAMUCIL) 58.6 % powder, 2 caps every day , Disp: , Rfl: ;  rosuvastatin (CRESTOR) 20 MG tablet, Take 20 mg by mouth at bedtime., Disp: , Rfl:    Inpatient Medications:  . atropine      . atropine  0.4 mg Intravenous Once    Allergies:  Allergies  Allergen Reactions  . Ace Inhibitors     REACTION: cough    History   Social History  . Marital Status: Married    Spouse Name: N/A    Number of Children: 2  . Years of Education: N/A   Occupational History  . 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 Smoking status: Former Research scientist (life sciences)  . Smokeless tobacco: Never Used     Comment: quit 2/01  . Alcohol Use: No  . Drug Use: No  . Sexual Activity: Not on file   Other Topics Concern  . Not on file   Social History Narrative   Daily caffeine      Family History  Problem Relation Age of Onset  . Heart disease Other     Cardiovascular disorder, CHF  . Stroke Other      1st degree relative male and male  . Colon cancer Neg Hx     Physical Exam: Filed Vitals:   07/09/13 1315 07/09/13 1330 07/09/13 1345 07/09/13 1400  BP: 134/48 128/49 128/47 120/48  Pulse: 40 37 38 37  Temp:      TempSrc:      Resp: 18 18 18 16   Height:      Weight:      SpO2: 95% 96% 95% 94%    GEN- The patient is elderly appearing, alert and oriented x 3 today.   Head- normocephalic, atraumatic Eyes-  Sclera clear, conjunctiva pink Ears- hearing intact Oropharynx- clear Neck- supple,  Lungs- Clear to ausculation bilaterally, normal work of breathing Heart- bradycardic regular rhythm GI- soft, NT, ND, + BS Extremities- no clubbing, cyanosis, or edema MS- no significant deformity or atrophy Skin- no rash or lesion Psych- euthymic mood, full affect Neuro- strength and sensation are intact  Labs:   Lab Results  Component Value Date   WBC 10.6* 07/09/2013   HGB 15.2 07/09/2013   HCT 43.2 07/09/2013   MCV 87.6 07/09/2013   PLT 163 07/09/2013   No results found for this basename: NA, K, CL, CO2, BUN, CREATININE, CALCIUM, LABALBU, PROT, BILITOT, ALKPHOS, ALT, AST, GLUCOSE,  in the last 168 hours No results found for this basename: CKTOTAL, CKMB, CKMBINDEX, TROPONINI     Radiology/Studies: Dg Chest 2 View 07/09/2013   CLINICAL DATA:  Bradycardia.  Near syncope.  EXAM: CHEST  2 VIEW  COMPARISON:  DG CHEST 2 VIEW dated 03/27/2008; DG CHEST 2 VIEW dated 12/10/2007  FINDINGS: Atherosclerotic aortic arch. Stable lingular scarring. Tortuous thoracic aorta.  No pleural effusion. Thoracic spondylosis is present. Pectus excavatum noted.  IMPRESSION: 1. No acute findings. 2. Stable lingular scarring. 3. Atherosclerosis.   Electronically Signed   By: Sherryl Barters M.D.   On: 07/09/2013 12:15    EKG: sinus rhythm with first degree AV block and RBBB, there is a second EKG which reveals 2:1 AV block  TELEMETRY:  2:1 heart block with ventricular rates in the 30's, intermittent 1:1 AV  conduction  Assessment and Plan:  1. 2:1 AV block I am concerned that he has degenerative conduction system disease and will likely require pacing.  He has been on metoprolol day.  I think that the most prudent course at this point is to admit to [facility] for observation.  If his conduction does not return to normal with metoprolol washout then pacing would be required.  2. HTN Hold metoprolol and amlodipine  3.  PVD Stable No change required today  4. DM Follow  blood sugars while here  Admit to cardiology/ TCU

## 2013-07-09 NOTE — ED Provider Notes (Signed)
CSN: [id number]     Arrival date & time 07/09/13  1101 History   First MD Initiated Contact with Patient 07/09/13 1102     Chief Complaint  Patient presents with  . Bradycardia     (Consider location/radiation/quality/duration/timing/severity/associated sxs/prior Treatment) Patient is a 80 y.o. male presenting with dizziness. The history is provided by the patient.  Dizziness Quality:  Lightheadedness Severity:  Moderate Onset quality:  Sudden Timing:  Constant Progression:  Improving Chronicity:  New Context comment:  Was reading the paper Relieved by:  Nothing Worsened by:  Nothing tried Associated symptoms: weakness   Associated symptoms: no chest pain, no nausea, no shortness of breath, no syncope and no vomiting     Past Medical History  Diagnosis Date  . ALLERGIC RHINITIS 04/09/2007  . BACTEREMIA, 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  . BENIGN PROSTATIC HYPERTROPHY 04/09/2007  . BRADYCARDIA 05/11/2009  . COLONIC POLYPS, HX OF 04/09/2007    ADENOMATOUS POLYPS 4098,1191  . COPD 04/09/2007  . Cough 03/27/2008  . DEPRESSION 04/09/2007  . DIABETES MELLITUS, TYPE II 10/09/2007  . DIVERTICULOSIS, COLON 04/09/2007  . DIZZINESS 10/06/2008  . GLUCOSE INTOLERANCE 04/09/2007  . HYPERLIPIDEMIA 03/27/2007  . HYPERTENSION 03/27/2007  . PERIPHERAL VASCULAR DISEASE 03/27/2007  . PNEUMONIA ORGANISM NOS 11/08/2007  . SHINGLES, HX OF 04/09/2007  . SHOULDER PAIN, BILATERAL 08/01/2007  . Blood transfusion without reported diagnosis     really not sure thinks 30-40 years ago  . Ulcer     40 years ago   Past Surgical History  Procedure Laterality Date  . Carotid endarterectomy  5/08    left  . Appendectomy    . S/p pud surgury      ? partial gastrectomy  . Carpal tunnel release      bilateral  . Esophagogastroduodenoscopy  06/07/2004  . Colonoscopy    . Hemorrhoid banding  1990s   Family History  Problem Relation Age of Onset  . Heart disease Other     Cardiovascular  disorder, CHF  . Stroke Other     1st degree relative male and male  . Colon cancer Neg Hx    History  Substance Use Topics  . Smoking status: Former Research scientist (life sciences)  . Smokeless tobacco: Never Used     Comment: quit 2/01  . Alcohol Use: No    Review of Systems  Constitutional: Positive for diaphoresis and fatigue. Negative for fever.  Respiratory: Negative for cough and shortness of breath.   Cardiovascular: Negative for chest pain, leg swelling and syncope.  Gastrointestinal: Negative for nausea and vomiting.  Neurological: Positive for dizziness.  All other systems reviewed and are negative.      Allergies  Ace inhibitors  Home Medications   Current Outpatient Rx  Name  Route  Sig  Dispense  Refill  . amLODipine (NORVASC) 10 MG tablet      TAKE ONE TABLET BY MOUTH EVERY DAY   60 tablet   6   . aspirin 325 MG tablet   Oral   Take 325 mg by mouth daily.           . Coenzyme Q10 (COQ-10) 200 MG CAPS   Oral   Take by mouth daily.           . CRESTOR 20 MG tablet      TAKE ONE TABLET BY MOUTH AT BEDTIME   30 tablet   11   . losartan-hydrochlorothiazide (HYZAAR) 100-25 MG per tablet      TAKE ONE TABLET  BY MOUTH EVERY DAY   90 tablet   1   . metoprolol succinate (TOPROL-XL) 25 MG 24 hr tablet      TAKE ONE TABLET BY MOUTH ONCE DAILY   90 tablet   3   . omeprazole (PRILOSEC) 20 MG capsule   Oral   Take 1 capsule (20 mg total) by mouth daily.   90 capsule   3   . psyllium (METAMUCIL) 58.6 % powder      2 caps every day           SpO2 97% Physical Exam  Nursing note and vitals reviewed. Constitutional: He is oriented to person, place, and time. He appears well-developed and well-nourished. No distress.  HENT:  Head: Normocephalic and atraumatic.  Mouth/Throat: No oropharyngeal exudate.  Eyes: EOM are normal. Pupils are equal, round, and reactive to light.  Neck: Normal range of motion. Neck supple.  Cardiovascular: Normal rate and regular  rhythm.  Exam reveals no friction rub.   No murmur heard. Pulmonary/Chest: Effort normal and breath sounds normal. No respiratory distress. He has no wheezes. He has no rales.  Abdominal: He exhibits no distension. There is no tenderness. There is no rebound.  Musculoskeletal: Normal range of motion. He exhibits no edema.  Neurological: He is alert and oriented to person, place, and time. No cranial nerve deficit. He exhibits normal muscle tone.  Skin: No rash noted. He is not diaphoretic.    ED Course  Procedures (including critical care time) Labs Review Labs Reviewed  CBC - Abnormal; Notable for the following:    WBC 10.6 (*)    All other components within normal limits  BASIC METABOLIC PANEL - Abnormal; Notable for the following:    Glucose, Bld 181 (*)    GFR calc non Af Amer 48 (*)    GFR calc Af Amer 55 (*)    All other components within normal limits  POCT I-STAT TROPONIN I   Imaging Review Dg Chest 2 View  07/09/2013   CLINICAL DATA:  Bradycardia.  Near syncope.  EXAM: CHEST  2 VIEW  COMPARISON:  DG CHEST 2 VIEW dated 03/27/2008; DG CHEST 2 VIEW dated 12/10/2007  FINDINGS: Atherosclerotic aortic arch. Stable lingular scarring. Tortuous thoracic aorta.  No pleural effusion. Thoracic spondylosis is present. Pectus excavatum noted.  IMPRESSION: 1. No acute findings. 2. Stable lingular scarring. 3. Atherosclerosis.   Electronically Signed   By: Sherryl Barters M.D.   On: 07/09/2013 12:15    EKG Interpretation    Date/Time:  Tuesday July 09 2013 12:51:08 EST Ventricular Rate:  38 PR Interval:  239 QRS Duration: 168 QT Interval:  516 QTC Calculation: 410 R Axis:   76 Text Interpretation:  Sinus bradycardia Prolonged PR interval Right bundle branch block Mobitz II 2-degree AV block with 2:1 conduction Confirmed by Mingo Amber  MD, Cheron Pasquarelli ([id number]) on 07/09/2013 12:54:34 PM            MDM   Final diagnoses:  Mobitz II  Symptomatic bradycardia    80 year old male with  history of hypertension, COPD presents with diaphoresis, bradycardia. He was sitting reading the paper and had onset of diaphoresis, dizziness, and clamminess. Patient denied any chest pain or shortness of breath. With EMS his initial heart rate was in the high 30s. Blood pressures were initially 90 over palp been improved down to the 140s over 80s. He states is not feeling well, not having any chest pain or shortness of breath this time. Here patient  is 93% on room air with good waveform. He is sinus rhythm in the 60s on the monitor. His normal blood pressure 140s over 80s. Patient without adventitious lung sounds. He is alert and oriented. He has normal strength and sensation in all extremities. No belly pain. Unclear etiology, possible cardiac etiology. Will check labs. EKG normal sinus rhyhtm in the 60s. Patient intermittently bradycardic here - EKG captured shows Mobitz II with 2:1 conduction. Cards consulted for symptomatic bradycardia. Cards admitting.  Osvaldo Shipper, MD 07/09/13 [phone]

## 2013-07-09 NOTE — ED Notes (Signed)
Dinner tray ordered.

## 2013-07-09 NOTE — ED Notes (Signed)
Cardiology at bedside.

## 2013-07-10 ENCOUNTER — Encounter (HOSPITAL_COMMUNITY): Payer: Self-pay | Admitting: *Deleted

## 2013-07-10 DIAGNOSIS — J449 Chronic obstructive pulmonary disease, unspecified: Secondary | ICD-10-CM

## 2013-07-10 DIAGNOSIS — I359 Nonrheumatic aortic valve disorder, unspecified: Secondary | ICD-10-CM

## 2013-07-10 LAB — BASIC METABOLIC PANEL
BUN: 29 mg/dL — ABNORMAL HIGH (ref 6–23)
CHLORIDE: 98 meq/L (ref 96–112)
CO2: 23 mEq/L (ref 19–32)
Calcium: 8.6 mg/dL (ref 8.4–10.5)
Creatinine, Ser: 1.56 mg/dL — ABNORMAL HIGH (ref 0.50–1.35)
GFR calc Af Amer: 46 mL/min — ABNORMAL LOW (ref >90)
GFR calc non Af Amer: 40 mL/min — ABNORMAL LOW (ref >90)
Glucose, Bld: 131 mg/dL — ABNORMAL HIGH (ref 70–99)
POTASSIUM: 4.2 meq/L (ref 3.7–5.3)
Sodium: 137 mEq/L (ref 137–147)

## 2013-07-10 LAB — CBC
HCT: 41.4 % (ref 39.0–52.0)
Hemoglobin: 14.5 g/dL (ref 13.0–17.0)
MCH: 30.8 pg (ref 26.0–34.0)
MCHC: 35 g/dL (ref 30.0–36.0)
MCV: 87.9 fL (ref 78.0–100.0)
PLATELETS: 162 10*3/uL (ref 150–400)
RBC: 4.71 MIL/uL (ref 4.22–5.81)
RDW: 12.8 % (ref 11.5–15.5)
WBC: 10.7 10*3/uL — AB (ref 4.0–10.5)

## 2013-07-10 LAB — GLUCOSE, CAPILLARY
GLUCOSE-CAPILLARY: 121 mg/dL — AB (ref 70–99)
Glucose-Capillary: 119 mg/dL — ABNORMAL HIGH (ref 70–99)
Glucose-Capillary: 87 mg/dL (ref 70–99)

## 2013-07-10 LAB — TROPONIN I

## 2013-07-10 LAB — TSH: TSH: 1.55 u[IU]/mL (ref 0.350–4.500)

## 2013-07-10 LAB — T4, FREE: FREE T4: 0.96 ng/dL (ref 0.80–1.80)

## 2013-07-10 MED ORDER — SODIUM CHLORIDE 0.9 % IR SOLN
80.0000 mg | Status: DC
  Filled 2013-07-10: qty 2

## 2013-07-10 MED ORDER — SODIUM CHLORIDE 0.9 % IV SOLN
INTRAVENOUS | Status: DC
  Administered 2013-07-11: 500 mL via INTRAVENOUS

## 2013-07-10 MED ORDER — CHLORHEXIDINE GLUCONATE 4 % EX LIQD: 60.0000 mL | Freq: Once | CUTANEOUS | Status: DC

## 2013-07-10 MED ORDER — CHLORHEXIDINE GLUCONATE 4 % EX LIQD
60.0000 mL | Freq: Once | CUTANEOUS | Status: AC
  Filled 2013-07-10: qty 60

## 2013-07-10 MED ORDER — CEFAZOLIN SODIUM-DEXTROSE 2-3 GM-% IV SOLR
2.0000 g | INTRAVENOUS | Status: DC
  Filled 2013-07-10: qty 50

## 2013-07-10 MED ORDER — CEFAZOLIN SODIUM-DEXTROSE 2-3 GM-% IV SOLR
2.0000 g | INTRAVENOUS | Status: DC
  Filled 2013-07-10 (×2): qty 50

## 2013-07-10 MED ORDER — CHLORHEXIDINE GLUCONATE 4 % EX LIQD
60.0000 mL | Freq: Once | CUTANEOUS | Status: AC
  Administered 2013-07-11: 4 via TOPICAL

## 2013-07-10 MED ORDER — SODIUM CHLORIDE 0.9 % IV SOLN: INTRAVENOUS | Status: DC

## 2013-07-10 NOTE — Progress Notes (Signed)
In to see patient at this time, patient is currently sleeping. Upon awakening the patient, the patient is asymptomatic, denies any chest pain, SOB, or fatigue. The patient has been throughout night  Heart block with 2:1 with rates 34-36 and as of last hr had rates in the 32-33 range. New 12 lead EKG taken of patient and patient remains in heart block with 2:1 conduction. MD Elias Else notified while on unit. No new orders obtained. Will continue to monitor the patient closely.

## 2013-07-10 NOTE — Progress Notes (Signed)
Echo Lab  2D Echocardiogram completed.  [place], RDCS 07/10/2013 11:23 AM

## 2013-07-10 NOTE — Progress Notes (Signed)
SUBJECTIVE: The patient is doing well today.  At this time, he denies chest pain, shortness of breath, or any new concerns.  . insulin aspart  0-9 Units Subcutaneous TID WC  . pantoprazole  40 mg Oral Daily  . sodium chloride  3 mL Intravenous Q12H      OBJECTIVE: Physical Exam: Filed Vitals:   07/09/13 2300 07/10/13 0100 07/10/13 0426 07/10/13 0500  BP: 126/45 116/46 119/46   Pulse: 39 39 37   Temp:  97.9 F (36.6 C) 97.5 F (36.4 C)   TempSrc:  Oral Oral   Resp: 13 17 17    Height:      Weight:    203 lb 14.8 oz (92.5 kg)  SpO2: 93% 89% 93%     Intake/Output Summary (Last 24 hours) at 07/10/13 7017 Last data filed at 07/10/13 0500  Gross per 24 hour  Intake      0 ml  Output   1230 ml  Net  -1230 ml    Telemetry reveals sinus rhythm  GEN- The patient is elderly appearing, alert and oriented x 3 today.   Head- normocephalic, atraumatic Eyes-  Sclera clear, conjunctiva pink Ears- hearing intact Oropharynx- clear Neck- supple  Lungs- Clear to ausculation bilaterally, normal work of breathing Heart- bradycardic regular rhythm  GI- soft, NT, ND, + BS Extremities- no clubbing, cyanosis, or edema Neuro- strength and sensation are intact  LABS: Basic Metabolic Panel:  Recent Labs  07/09/13 1133 07/10/13 0014  NA 140 137  K 4.7 4.2  CL 103 98  CO2 23 23  GLUCOSE 181* 131*  BUN 23 29*  CREATININE 1.35 1.56*  CALCIUM 9.0 8.6   Liver Function Tests: No results found for this basename: AST, ALT, ALKPHOS, BILITOT, PROT, ALBUMIN,  in the last 72 hours No results found for this basename: LIPASE, AMYLASE,  in the last 72 hours CBC:  Recent Labs  07/09/13 1133 07/10/13 0014  WBC 10.6* 10.7*  HGB 15.2 14.5  HCT 43.2 41.4  MCV 87.6 87.9  PLT 163 162   Cardiac Enzymes:  Recent Labs  07/09/13 1940 07/10/13 0014 07/10/13 0700  TROPONINI <0.30 <0.30 <0.30   BNP: No components found with this basename: POCBNP,  D-Dimer: No results found for this  basename: DDIMER,  in the last 72 hours Hemoglobin A1C: No results found for this basename: HGBA1C,  in the last 72 hours Fasting Lipid Panel: No results found for this basename: CHOL, HDL, LDLCALC, TRIG, CHOLHDL, LDLDIRECT,  in the last 72 hours Thyroid Function Tests:  Recent Labs  07/09/13 1940  TSH 1.550   Anemia Panel: No results found for this basename: VITAMINB12, FOLATE, FERRITIN, TIBC, IRON, RETICCTPCT,  in the last 72 hours  RADIOLOGY: Dg Chest 2 View  07/09/2013   CLINICAL DATA:  Bradycardia.  Near syncope.  EXAM: CHEST  2 VIEW  COMPARISON:  DG CHEST 2 VIEW dated 03/27/2008; DG CHEST 2 VIEW dated 12/10/2007  FINDINGS: Atherosclerotic aortic arch. Stable lingular scarring. Tortuous thoracic aorta.  No pleural effusion. Thoracic spondylosis is present. Pectus excavatum noted.  IMPRESSION: 1. No acute findings. 2. Stable lingular scarring. 3. Atherosclerosis.   Electronically Signed   By: Sherryl Barters M.D.   On: 07/09/2013 12:15    ASSESSMENT AND PLAN:  Active Problems:   Second degree AV block 1. 2:1 AV block  I am concerned that he has degenerative conduction system disease and will likely require pacing.  Metoprolol continues to wash out.  If his  conduction does not return to normal by tomorrow with metoprolol washout then pacing would be required.   Risks, benefits, alternatives to pacemaker implantation were discussed in detail with the patient today. The patient understands that the risks include but are not limited to bleeding, infection, pneumothorax, perforation, tamponade, vascular damage, renal failure, MI, stroke, death,  and lead dislodgement and wishes to proceed if indicated. We will therefore tentatively schedule the procedure for tomorrow.  2. HTN  Hold metoprolol and amlodipine   3. PVD  Stable  No change required today   4. DM  Follow blood sugars while here   Keep in stepdown for now  Thompson Grayer, MD 07/10/2013 8:22 AM

## 2013-07-11 ENCOUNTER — Encounter (HOSPITAL_COMMUNITY)
Admission: EM | Disposition: A | Discharge status: Home / Self Care | Payer: Self-pay | Source: Home / Self Care | Attending: Internal Medicine

## 2013-07-11 DIAGNOSIS — I441 Atrioventricular block, second degree: Secondary | ICD-10-CM

## 2013-07-11 HISTORY — PX: PACEMAKER INSERTION: SHX728

## 2013-07-11 HISTORY — PX: PERMANENT PACEMAKER INSERTION: SHX5480

## 2013-07-11 LAB — GLUCOSE, CAPILLARY
GLUCOSE-CAPILLARY: 149 mg/dL — AB (ref 70–99)
GLUCOSE-CAPILLARY: 115 mg/dL — AB (ref 70–99)
Glucose-Capillary: 95 mg/dL (ref 70–99)
Glucose-Capillary: 100 mg/dL — ABNORMAL HIGH (ref 70–99)

## 2013-07-11 SURGERY — PERMANENT PACEMAKER INSERTION
Anesthesia: LOCAL

## 2013-07-11 MED ORDER — FENTANYL CITRATE 0.05 MG/ML IJ SOLN
INTRAMUSCULAR | Status: AC
  Filled 2013-07-11: qty 2

## 2013-07-11 MED ORDER — ACETAMINOPHEN 325 MG PO TABS: 325.0000 mg | ORAL_TABLET | ORAL | Status: DC | PRN

## 2013-07-11 MED ORDER — ONDANSETRON HCL 4 MG/2ML IJ SOLN: 4.0000 mg | Freq: Four times a day (QID) | INTRAMUSCULAR | Status: DC | PRN

## 2013-07-11 MED ORDER — MIDAZOLAM HCL 5 MG/5ML IJ SOLN
INTRAMUSCULAR | Status: AC
  Filled 2013-07-11: qty 5

## 2013-07-11 MED ORDER — LIDOCAINE HCL (PF) 1 % IJ SOLN
INTRAMUSCULAR | Status: AC
  Filled 2013-07-11: qty 60

## 2013-07-11 MED ORDER — CEFAZOLIN SODIUM-DEXTROSE 2-3 GM-% IV SOLR
2.0000 g | Freq: Four times a day (QID) | INTRAVENOUS | Status: AC
  Administered 2013-07-11 (×3): 2 g via INTRAVENOUS
  Filled 2013-07-11 (×3): qty 50

## 2013-07-11 NOTE — H&P (View-Only) (Signed)
SUBJECTIVE: The patient is doing well today.  At this time, he denies chest pain, shortness of breath, or any new concerns.  . insulin aspart  0-9 Units Subcutaneous TID WC  . pantoprazole  40 mg Oral Daily  . sodium chloride  3 mL Intravenous Q12H      OBJECTIVE: Physical Exam: Filed Vitals:   07/09/13 2300 07/10/13 0100 07/10/13 0426 07/10/13 0500  BP: 126/45 116/46 119/46   Pulse: 39 39 37   Temp:  97.9 F (36.6 C) 97.5 F (36.4 C)   TempSrc:  Oral Oral   Resp: 13 17 17    Height:      Weight:    203 lb 14.8 oz (92.5 kg)  SpO2: 93% 89% 93%     Intake/Output Summary (Last 24 hours) at 07/10/13 7017 Last data filed at 07/10/13 0500  Gross per 24 hour  Intake      0 ml  Output   1230 ml  Net  -1230 ml    Telemetry reveals sinus rhythm  GEN- The patient is elderly appearing, alert and oriented x 3 today.   Head- normocephalic, atraumatic Eyes-  Sclera clear, conjunctiva pink Ears- hearing intact Oropharynx- clear Neck- supple  Lungs- Clear to ausculation bilaterally, normal work of breathing Heart- bradycardic regular rhythm  GI- soft, NT, ND, + BS Extremities- no clubbing, cyanosis, or edema Neuro- strength and sensation are intact  LABS: Basic Metabolic Panel:  Recent Labs  07/09/13 1133 07/10/13 0014  NA 140 137  K 4.7 4.2  CL 103 98  CO2 23 23  GLUCOSE 181* 131*  BUN 23 29*  CREATININE 1.35 1.56*  CALCIUM 9.0 8.6   Liver Function Tests: No results found for this basename: AST, ALT, ALKPHOS, BILITOT, PROT, ALBUMIN,  in the last 72 hours No results found for this basename: LIPASE, AMYLASE,  in the last 72 hours CBC:  Recent Labs  07/09/13 1133 07/10/13 0014  WBC 10.6* 10.7*  HGB 15.2 14.5  HCT 43.2 41.4  MCV 87.6 87.9  PLT 163 162   Cardiac Enzymes:  Recent Labs  07/09/13 1940 07/10/13 0014 07/10/13 0700  TROPONINI <0.30 <0.30 <0.30   BNP: No components found with this basename: POCBNP,  D-Dimer: No results found for this  basename: DDIMER,  in the last 72 hours Hemoglobin A1C: No results found for this basename: HGBA1C,  in the last 72 hours Fasting Lipid Panel: No results found for this basename: CHOL, HDL, LDLCALC, TRIG, CHOLHDL, LDLDIRECT,  in the last 72 hours Thyroid Function Tests:  Recent Labs  07/09/13 1940  TSH 1.550   Anemia Panel: No results found for this basename: VITAMINB12, FOLATE, FERRITIN, TIBC, IRON, RETICCTPCT,  in the last 72 hours  RADIOLOGY: Dg Chest 2 View  07/09/2013   CLINICAL DATA:  Bradycardia.  Near syncope.  EXAM: CHEST  2 VIEW  COMPARISON:  DG CHEST 2 VIEW dated 03/27/2008; DG CHEST 2 VIEW dated 12/10/2007  FINDINGS: Atherosclerotic aortic arch. Stable lingular scarring. Tortuous thoracic aorta.  No pleural effusion. Thoracic spondylosis is present. Pectus excavatum noted.  IMPRESSION: 1. No acute findings. 2. Stable lingular scarring. 3. Atherosclerosis.   Electronically Signed   By: Sherryl Barters M.D.   On: 07/09/2013 12:15    ASSESSMENT AND PLAN:  Active Problems:   Second degree AV block 1. 2:1 AV block  I am concerned that he has degenerative conduction system disease and will likely require pacing.  Metoprolol continues to wash out.  If his  conduction does not return to normal by tomorrow with metoprolol washout then pacing would be required.   Risks, benefits, alternatives to pacemaker implantation were discussed in detail with the patient today. The patient understands that the risks include but are not limited to bleeding, infection, pneumothorax, perforation, tamponade, vascular damage, renal failure, MI, stroke, death,  and lead dislodgement and wishes to proceed if indicated. We will therefore tentatively schedule the procedure for tomorrow.  2. HTN  Hold metoprolol and amlodipine   3. PVD  Stable  No change required today   4. DM  Follow blood sugars while here   Keep in stepdown for now  Diquan Brentlee Delage, MD 07/10/2013 8:22 AM  

## 2013-07-11 NOTE — CV Procedure (Signed)
SURGEON:  Cristopher Peru, MD     PREPROCEDURE DIAGNOSIS:  Symptomatic Bradycardia due to 2:1 geart block    POSTPROCEDURE DIAGNOSIS:  Same as preprocedure diagnosis     PROCEDURES:    1. Pacemaker implantation.     INTRODUCTION: Daniel Woodard is a 80 y.o. male  with a history of bradycardia who presents today for pacemaker implantation.  The patient reports intermittent episodes of dizziness over the past few months. He had one episode of syncope. No reversible causes have been identified.  He was on a beta blocker but has had persistent heart block despite a 48 hour washout. The patient therefore presents today for pacemaker implantation.     DESCRIPTION OF PROCEDURE:  Informed written consent was obtained, and   the patient was brought to the electrophysiology lab in a fasting state.  The patient required no sedation for the procedure today.  The patients left chest was prepped and draped in the usual sterile fashion by the EP lab staff. The skin overlying the left deltopectoral region was infiltrated with lidocaine for local analgesia.  A 4-cm incision was made over the left deltopectoral region.  A left subcutaneous pacemaker pocket was fashioned using a combination of sharp and blunt dissection. Electrocautery was required to assure hemostasis.    RA/RV Lead Placement: The left cephalic vein was therefore directly visualized and cannulated.  Through the left cephalic vein, a medtronic 5076, 52 cm (serial number [id number]) right atrial lead and a Medtronic 5076, 58 cm RV lead (serial number [id number]) right ventricular lead were advanced with fluoroscopic visualization into the right atrial appendage and right ventricular apical septal positions respectively.  Initial atrial lead P- waves measured 3.72mV with impedance of 611 ohms and a threshold of 1.3 V at 0.5 msec.  Right ventricular lead R-waves measured 12.1 mV with an impedance of 1041 ohms and a threshold of 1.0V at 0.5 msec.  Both leads  were secured to the pectoralis fascia using #2-0 silk over the suture sleeves.   Device Placement:  The leads were then connected to a Medtronic Adapta L DDD (serial number [id number]) pacemaker.  The pocket was irrigated with copious gentamicin solution.  The pacemaker was then placed into the pocket.  The pocket was then closed in 2 layers with 2.0 Vicryl suture for the subcutaneous and subcuticular layers.  Steri-Strips and a sterile dressing were then applied.  There were no early apparent complications.     CONCLUSIONS:   1. Successful implantation of a Medtronic dual-chamber pacemaker for symptomatic bradycardia due to 2:1 heart block  2. No early apparent complications.   Mikle Bosworth.D.

## 2013-07-11 NOTE — Interval H&P Note (Signed)
History and Physical Interval Note: Patient seen and examined. He continues to have 2:1, symptomatic AV block. I agree with Dr. Jackalyn Lombard assessment that his conduction system disease, and symptomatic bradycardia with syncope warrants PPM insertion. 07/11/2013 7:37 AM  Elmer Bales  has presented today for surgery, with the diagnosis of bradicardia  The various methods of treatment have been discussed with the patient and family. After consideration of risks, benefits and other options for treatment, the patient has consented to  Procedure(s): PERMANENT PACEMAKER INSERTION (N/A) as a surgical intervention .  The patient's history has been reviewed, patient examined, no change in status, stable for surgery.  I have reviewed the patient's chart and labs.  Questions were answered to the patient's satisfaction.     Mikle Bosworth.D.

## 2013-07-11 NOTE — Progress Notes (Signed)
Orthopedic Tech Progress Note Patient Details:  Daniel Woodard Feb 11, 1930 [id number]  Ortho Devices Type of Ortho Device: Arm sling Ortho Device/Splint Interventions: Application   Irish Elders 07/11/2013, 1:17 PM

## 2013-07-11 NOTE — Discharge Instructions (Signed)
° °  Supplemental Discharge Instructions for  Pacemaker/Defibrillator Patients  Activity No heavy lifting or vigorous activity with your left/right arm for 6 to 8 weeks.  Do not raise your left/right arm above your head for one week.  Gradually raise your affected arm as drawn below.           02/15                      02/16                       02/17                      02/18       NO DRIVING for 1 week; you may begin driving on 52/12/221. WOUND CARE   Keep the wound area clean and dry.  Do not get this area wet for one week. No showers for one week; you may shower on 07/20/2013.   The tape/steri-strips on your wound will fall off; do not pull them off.  No bandage is needed on the site.  DO  NOT apply any creams, oils, or ointments to the wound area.   If you notice any drainage or discharge from the wound, any swelling or bruising at the site, or you develop a fever > 101? F after you are discharged home, call the office at once.  Special Instructions   You are still able to use cellular telephones; use the ear opposite the side where you have your pacemaker/defibrillator.  Avoid carrying your cellular phone near your device.   When traveling through airports, show security personnel your identification card to avoid being screened in the metal detectors.  Ask the security personnel to use the hand wand.   Avoid arc welding equipment, MRI testing (magnetic resonance imaging), TENS units (transcutaneous nerve stimulators).  Call the office for questions about other devices.   Avoid electrical appliances that are in poor condition or are not properly grounded.   Microwave ovens are safe to be near or to operate.

## 2013-07-12 ENCOUNTER — Inpatient Hospital Stay (HOSPITAL_COMMUNITY): Payer: Medicare Other

## 2013-07-12 LAB — GLUCOSE, CAPILLARY: GLUCOSE-CAPILLARY: 123 mg/dL — AB (ref 70–99)

## 2013-07-12 NOTE — Discharge Summary (Signed)
ELECTROPHYSIOLOGY PROCEDURE DISCHARGE SUMMARY    Patient ID: Daniel Woodard,  MRN: [id number], DOB/AGE: 01-Nov-1929 80 y.o.  Admit date: 07/09/2013 Discharge date: 07/12/2013  Primary Care Physician: Cathlean Cower, MD Primary Cardiologist: Cristopher Peru, MD  Primary Discharge Diagnosis:  2:1 heart block status post pacemaker implant this admission  Secondary Discharge Diagnosis:  1.  Hypertension 2.  COPD 3.  Type II diabetes 4.  Hyperlipidemia  Allergies  Allergen Reactions  . Ace Inhibitors     REACTION: cough     Procedures This Admission: 1.  Implantation of a dual chamber pacemaker on 07-11-2013 by Dr Lovena Le.  The patient received a Medtronic Adapta L pacemaker with model number [id number] right atrial and right ventricular leads.  There were no early apparent complications.   2.  CXR on 07-12-2013 demonstrated no pneumothorax status post device implant.  3.  Echocardiogram on 07-10-13 demonstrated an EF of 65%, no RWMA, LA 35.   Brief HPI/Hospital Course: Daniel Woodard is a 80 y.o. male with a past medical history significant for hypertension, COPD, type II diabetes, and hyperlipidemia. He states that for the last several weeks he has had intermittent dizzy spells. Several months ago, he also had a frank syncopal spell that only lasted a few seconds. This morning, he was sitting down reading the newspaper and became diaphoretic, nauseated with a general feeling of being unwell. He called 911. Upon EMS arrival, he was found to be in heart block with a ventricular rate in the 30's. He was brought to [facility] for further evaluation.  He was found to be in 2:1 heart block.  His Metoprolol and Amlodipine were held without return of normal conduction.  He underwent pacemaker implantation as outlined above by Dr Lovena Le on 07-11-2013.  Overnight, he was in SR with ventricular pacing.  His left chest was without hematoma or ecchymosis.  CXR demonstrated no pneumothorax post device implant.  He was  evaluated by Dr Lovena Le and considered stable for discharge to home.  His Amlodipine and Metoprolol were resumed at discharge for hypertension.  He will have routine device follow up in the clinic.    Discharge Vitals: Blood pressure 153/79, pulse 83, temperature 98.7 F (37.1 C), temperature source Oral, resp. rate 18, height 6\' 2"  (1.88 m), weight 197 lb 5 oz (89.5 kg), SpO2 93.00%.    Labs:   Lab Results  Component Value Date   WBC 10.7* 07/10/2013   HGB 14.5 07/10/2013   HCT 41.4 07/10/2013   MCV 87.9 07/10/2013   PLT 162 07/10/2013    Recent Labs Lab 07/10/13 0014  NA 137  K 4.2  CL 98  CO2 23  BUN 29*  CREATININE 1.56*  CALCIUM 8.6  GLUCOSE 131*   Lab Results  Component Value Date   TROPONINI <0.30 07/10/2013      Discharge Medications:    Medication List         amLODipine 10 MG tablet  Commonly known as:  NORVASC  Take 10 mg by mouth daily.     aspirin 325 MG tablet  Take 325 mg by mouth daily.     CoQ-10 200 MG Caps  Take 200 mg by mouth daily.     losartan-hydrochlorothiazide 100-25 MG per tablet  Commonly known as:  HYZAAR  Take 1 tablet by mouth daily.     metoprolol succinate 25 MG 24 hr tablet  Commonly known as:  TOPROL-XL  Take 25 mg by mouth daily.  omeprazole 20 MG capsule  Commonly known as:  PRILOSEC  Take 20 mg by mouth daily.     psyllium 58.6 % powder  Commonly known as:  METAMUCIL  2 caps every day     rosuvastatin 20 MG tablet  Commonly known as:  CRESTOR  Take 20 mg by mouth at bedtime.        Disposition:       Future Appointments Provider Department Dept Phone   07/24/2013 4:00 PM [facility] [place] [facility] [phone]   09/05/2013 3:00 PM [facility] [place] [phone]   09/05/2013 4:00 PM Elam Dutch, MD [facility] [phone]   10/10/2013 2:30 PM Evans Lance, MD [place] Office [phone]   11/19/2013  10:00 AM Biagio Borg, MD [facility] [phone]     Follow-up Information   Follow up with [facility] [place] [facility] On 07/24/2013. (At 4:00 PM for wound check)    Specialty:  Cardiology   Contact information:   [address], [place] [address] [phone]      Follow up with Cristopher Peru, MD On 10/10/2013. (At 2:30 PM)    Specialty:  Cardiology   Contact information:   [address] [place] [address] [phone]       Duration of Discharge Encounter: Greater than 30 minutes including physician time.  Signed,   Thompson Grayer MD

## 2013-07-12 NOTE — Progress Notes (Signed)
   ELECTROPHYSIOLOGY ROUNDING NOTE    Patient Name: Daniel Woodard Date of Encounter: 07/12/2013    SUBJECTIVE:Patient feels well today.  No chest pain or shortness of breath.  Status post pacemaker implant yesterday for persistent 2:1 heart block  TELEMETRY: Reviewed telemetry pt in sinus tach at 105 with ventricular pacing Filed Vitals:   07/11/13 1159 07/11/13 1313 07/11/13 2134 07/12/13 0548  BP: 152/66 157/66 167/71   Pulse: 88 75 82   Temp: 97.6 F (36.4 C) 98 F (36.7 C) 97.8 F (36.6 C)   TempSrc: Oral Oral Oral   Resp: 18 18 18    Height:      Weight:    197 lb 5 oz (89.5 kg)  SpO2: 99% 98% 93%     Intake/Output Summary (Last 24 hours) at 07/12/13 3710 Last data filed at 07/11/13 2200  Gross per 24 hour  Intake    650 ml  Output    950 ml  Net   -300 ml    CURRENT MEDICATIONS: . insulin aspart  0-9 Units Subcutaneous TID WC  . pantoprazole  40 mg Oral Daily  . sodium chloride  3 mL Intravenous Q12H    LABS: Basic Metabolic Panel:  Recent Labs  07/09/13 1133 07/10/13 0014  NA 140 137  K 4.7 4.2  CL 103 98  CO2 23 23  GLUCOSE 181* 131*  BUN 23 29*  CREATININE 1.35 1.56*  CALCIUM 9.0 8.6   CBC:  Recent Labs  07/09/13 1133 07/10/13 0014  WBC 10.6* 10.7*  HGB 15.2 14.5  HCT 43.2 41.4  MCV 87.6 87.9  PLT 163 162   Cardiac Enzymes:  Recent Labs  07/09/13 1940 07/10/13 0014 07/10/13 0700  TROPONINI <0.30 <0.30 <0.30   Thyroid Function Tests:  Recent Labs  07/09/13 1940  TSH 1.550    Radiology/Studies:  Dg Chest 2 View 07/09/2013   CLINICAL DATA:  Bradycardia.  Near syncope.  EXAM: CHEST  2 VIEW  COMPARISON:  DG CHEST 2 VIEW dated 03/27/2008; DG CHEST 2 VIEW dated 12/10/2007  FINDINGS: Atherosclerotic aortic arch. Stable lingular scarring. Tortuous thoracic aorta.  No pleural effusion. Thoracic spondylosis is present. Pectus excavatum noted.  IMPRESSION: 1. No acute findings. 2. Stable lingular scarring. 3. Atherosclerosis.    Electronically Signed   By: Sherryl Barters M.D.   On: 07/09/2013 12:15   Final result pending, leads in stable position.  PHYSICAL EXAM Left chest with small hematoma.   DEVICE INTERROGATION: Device interrogation pending  Active Problems:   Second degree AV block    Pt was on Metoprolol and Amlodipine at home - both of which have been held this admission  A/P 1. Symptomatic high grade heart block 2. S/p PPM 3. HTN Rec: [place] for discharge home. Would restart blood pressure meds. Usual followup.   Mikle Bosworth.D.

## 2013-07-16 ENCOUNTER — Encounter (HOSPITAL_COMMUNITY): Payer: Self-pay | Admitting: *Deleted

## 2013-07-24 ENCOUNTER — Ambulatory Visit: Payer: Medicare Other

## 2013-07-26 ENCOUNTER — Ambulatory Visit (INDEPENDENT_AMBULATORY_CARE_PROVIDER_SITE_OTHER): Payer: Medicare Other | Admitting: *Deleted

## 2013-07-26 DIAGNOSIS — I441 Atrioventricular block, second degree: Secondary | ICD-10-CM

## 2013-07-26 LAB — MDC_IDC_ENUM_SESS_TYPE_INCLINIC
Battery Impedance: 100 Ohm
Brady Statistic AP VP Percent: 14 %
Brady Statistic AP VS Percent: 0 %
Brady Statistic AS VS Percent: 0 %
Date Time Interrogation Session: 20150227153141
Lead Channel Impedance Value: 506 Ohm
Lead Channel Impedance Value: 687 Ohm
Lead Channel Pacing Threshold Amplitude: 0.5 V
Lead Channel Pacing Threshold Pulse Width: 0.4 ms
Lead Channel Sensing Intrinsic Amplitude: 4 mV
MDC IDC MSMT BATTERY REMAINING LONGEVITY: 123 mo
MDC IDC MSMT BATTERY VOLTAGE: 2.78 V
MDC IDC MSMT LEADCHNL RV PACING THRESHOLD AMPLITUDE: 0.75 V
MDC IDC MSMT LEADCHNL RV PACING THRESHOLD PULSEWIDTH: 0.4 ms
MDC IDC MSMT LEADCHNL RV SENSING INTR AMPL: 15.67 mV
MDC IDC SET LEADCHNL RA PACING AMPLITUDE: 3.5 V
MDC IDC SET LEADCHNL RV PACING AMPLITUDE: 3.5 V
MDC IDC SET LEADCHNL RV PACING PULSEWIDTH: 0.4 ms
MDC IDC SET LEADCHNL RV SENSING SENSITIVITY: 4 mV
MDC IDC STAT BRADY AS VP PERCENT: 86 %

## 2013-07-26 NOTE — Progress Notes (Signed)
Wound check appointment. Steri-strips removed. Wound without redness or edema. Incision edges approximated, wound well healed. Normal device function. Thresholds, sensing, and impedances consistent with implant measurements. Device programmed at 3.5V/auto capture programmed on for extra safety margin until 3 month visit. Histogram distribution appropriate for patient and level of activity. No mode switches or high ventricular rates noted. Patient educated about wound care, arm mobility, lifting restrictions. ROV w/ Dr. Lovena Le 10/10/13 @ 2:30

## 2013-08-05 ENCOUNTER — Encounter: Payer: Self-pay | Admitting: Internal Medicine

## 2013-08-26 ENCOUNTER — Other Ambulatory Visit: Payer: Self-pay | Admitting: Internal Medicine

## 2013-08-26 NOTE — Telephone Encounter (Signed)
Refill done.  

## 2013-09-05 ENCOUNTER — Ambulatory Visit: Payer: Medicare Other | Admitting: Vascular Surgery

## 2013-09-05 ENCOUNTER — Other Ambulatory Visit (HOSPITAL_COMMUNITY): Payer: Medicare Other

## 2013-09-18 ENCOUNTER — Encounter: Payer: Self-pay | Admitting: Family

## 2013-09-19 ENCOUNTER — Ambulatory Visit (HOSPITAL_COMMUNITY)
Admission: RE | Admit: 2013-09-19 | Discharge: 2013-09-19 | Disposition: A | Discharge status: Home / Self Care | Payer: Medicare Other | Source: Ambulatory Visit | Attending: Vascular Surgery | Admitting: Vascular Surgery

## 2013-09-19 ENCOUNTER — Encounter: Payer: Self-pay | Admitting: Family

## 2013-09-19 ENCOUNTER — Ambulatory Visit (INDEPENDENT_AMBULATORY_CARE_PROVIDER_SITE_OTHER): Payer: Medicare Other | Admitting: Family

## 2013-09-19 VITALS — BP 127/79 | HR 63 | Resp 16 | Ht 73.0 in | Wt 208.0 lb

## 2013-09-19 DIAGNOSIS — Z48812 Encounter for surgical aftercare following surgery on the circulatory system: Secondary | ICD-10-CM

## 2013-09-19 DIAGNOSIS — I6529 Occlusion and stenosis of unspecified carotid artery: Secondary | ICD-10-CM

## 2013-09-19 DIAGNOSIS — I6521 Occlusion and stenosis of right carotid artery: Secondary | ICD-10-CM | POA: Insufficient documentation

## 2013-09-19 DIAGNOSIS — I708 Atherosclerosis of other arteries: Secondary | ICD-10-CM | POA: Insufficient documentation

## 2013-09-19 NOTE — Patient Instructions (Signed)

## 2013-09-19 NOTE — Progress Notes (Signed)
Established Carotid Patient   History of Present Illness  Daniel Woodard is a 80 y.o. male patient of Dr. Oneida Alar who presents for follow-up evaluation for carotid stenosis. He previously had left carotid endarterectomy by Dr. Amedeo Plenty in February of 2001.  Patient has Negative history of TIA or stroke symptom.  The patient denies amaurosis fugax or monocular blindness.  The patient  denies facial drooping.  Pt. denies hemiplegia.  The patient denies receptive or expressive aphasia.  He has a pacemaker inserted in February, 2015 for heart block; states this improves how he feels.  He denies steal symptoms in either hand/arm, denies dizziness when reaching overhead.  Pt Diabetic: No, he states he is not, but + in his PMHX Pt smoker: former smoker, quit in 2005  Pt meds include: Statin : Yes ASA: Yes Other anticoagulants/antiplatelets: no   Past Medical History  Diagnosis Date  . ALLERGIC RHINITIS 04/09/2007  . BACTEREMIA, 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  . BENIGN PROSTATIC HYPERTROPHY 04/09/2007  . BRADYCARDIA 05/11/2009  . COLONIC POLYPS, HX OF 04/09/2007    ADENOMATOUS POLYPS 3818,2993  . COPD 04/09/2007  . Cough 03/27/2008  . DEPRESSION 04/09/2007  . DIABETES MELLITUS, TYPE II 10/09/2007  . DIVERTICULOSIS, COLON 04/09/2007  . DIZZINESS 10/06/2008  . GLUCOSE INTOLERANCE 04/09/2007  . HYPERLIPIDEMIA 03/27/2007  . HYPERTENSION 03/27/2007  . PERIPHERAL VASCULAR DISEASE 03/27/2007  . PNEUMONIA ORGANISM NOS 11/08/2007  . SHINGLES, HX OF 04/09/2007  . SHOULDER PAIN, BILATERAL 08/01/2007  . Blood transfusion without reported diagnosis     really not sure thinks 30-40 years ago  . Ulcer     40 years ago  . Complete heart block     Social History History  Substance Use Topics  . Smoking status: Former Research scientist (life sciences)  . Smokeless tobacco: Never Used     Comment: quit 2/01  . Alcohol Use: No    Family History Family History  Problem Relation Age of Onset  . Heart disease  Other     Cardiovascular disorder, CHF  . Stroke Other     1st degree relative male and male  . Colon cancer Neg Hx     Surgical History Past Surgical History  Procedure Laterality Date  . Carotid endarterectomy  5/08    left  . Appendectomy    . S/p pud surgury      ? partial gastrectomy  . Carpal tunnel release      bilateral  . Esophagogastroduodenoscopy  06/07/2004  . Colonoscopy    . Hemorrhoid banding  1990s  . Pacemaker insertion  07/11/2013    MDT ADDRL1 pacemaker implanted by Dr Lovena Le for complete heart block    Allergies  Allergen Reactions  . Ace Inhibitors     REACTION: cough    Current Outpatient Prescriptions  Medication Sig Dispense Refill  . amLODipine (NORVASC) 10 MG tablet Take 10 mg by mouth daily.      Marland Kitchen aspirin 325 MG tablet Take 325 mg by mouth daily.        . Coenzyme Q10 (COQ-10) 200 MG CAPS Take 200 mg by mouth daily.       Marland Kitchen losartan-hydrochlorothiazide (HYZAAR) 100-25 MG per tablet Take 1 tablet by mouth daily.      . metoprolol succinate (TOPROL-XL) 25 MG 24 hr tablet Take 25 mg by mouth daily.      Marland Kitchen omeprazole (PRILOSEC) 20 MG capsule Take 20 mg by mouth daily.      . psyllium (METAMUCIL) 58.6 % powder 2  caps every day       . rosuvastatin (CRESTOR) 20 MG tablet Take 20 mg by mouth at bedtime.      Marland Kitchen amLODipine (NORVASC) 10 MG tablet TAKE ONE TABLET BY MOUTH EVERY DAY  60 tablet  6   No current facility-administered medications for this visit.    Review of Systems : See HPI for pertinent positives and negatives.  Physical Examination  Filed Vitals:   09/19/13 1414 09/19/13 1417  BP: 162/79 127/79  Pulse: 64 63  Resp:  16  Height:  6\' 1"  (1.854 m)  Weight:  208 lb (94.348 kg)  SpO2:  95%   Body mass index is 27.45 kg/(m^2).  General: WDWN male in NAD GAIT: normal Eyes: PERRLA Pulmonary:  Non-labored, decreased air movement in all posterior fields, Negative  Rales, Negative rhonchi, & Negative wheezing.  Cardiac: regular  Rhythm ,  Negative detected murmur. Pacemaker palpated subcutaneously in left upper chest.  VASCULAR EXAM Carotid Bruits Left Right   Negative Negative    Radial pulses are 2+ palpable and equal.                                                                                                                        Gastrointestinal: soft, nontender, BS WNL, no r/g,  negative masses.  Musculoskeletal: Negative muscle atrophy/wasting. M/S 5/5 throughout, Extremities without ischemic changes.  Neurologic: A&O X 3; Appropriate Affect ; SENSATION ;normal;  Speech is normal CN 2-12 intact, Pain and light touch intact in extremities, Motor exam as listed above.   Non-Invasive Vascular Imaging CAROTID DUPLEX 09/19/2013   CEREBROVASCULAR DUPLEX EVALUATION    INDICATION: Follow-up carotid disease     PREVIOUS INTERVENTION(S): Left carotid endarterectomy 07/18/1999    DUPLEX EXAM:     RIGHT  LEFT  Peak Systolic Velocities (cm/s) End Diastolic Velocities (cm/s) Plaque LOCATION Peak Systolic Velocities (cm/s) End Diastolic Velocities (cm/s) Plaque  84 0  CCA PROXIMAL 104 14   80 9  CCA MID 100 10   84 5  CCA DISTAL 86 10   269 0 HT ECA 108 0   100 19 HT ICA PROXIMAL 94 16   97 17  ICA MID 100 20   117 18  ICA DISTAL 100 17     1.2 ICA / CCA Ratio (PSV) NA  Antegrade  Vertebral Flow Bidirectional  284 Brachial Systolic Pressure (mmHg) 132  Within normal limits  Brachial Artery Waveforms Monophasic      Plaque Morphology:  HM = Homogeneous, HT = Heterogeneous, CP = Calcific Plaque, SP = Smooth Plaque, IP = Irregular Plaque     ADDITIONAL FINDINGS:     IMPRESSION: 1. Evidence of <40% stenosis of the right internal carotid artery. 2. Stenosis of the right external carotid artery is observed. 3. Widely patent left carotid endarterectomy without evidence of restenosis or hyperplasia.  4. Right vertebral artery is antegrade. 5. Left vertebral artery is bidirectional with monophasic  subclavian artery waveforms indicative  of subclavian steal.    Compared to the previous exam:  No significant change compared to prior exam.       Assessment: Daniel Woodard is a 80 y.o. male who is s/p left carotid endarterectomy on 07/18/1999, presents with asymptomatic minimal right ICA stenosis and widely patent left ICA s/p CEA.Left vertebral artery is bidirectional with monophasic subclavian artery waveforms indicative of subclavian steal. He has no steal symptoms in either hand/arm, denies dizziness when reaching overhead. The  ICA stenosis is  Unchanged from previous exam.  Plan: Follow-up in 1 year with Carotid Duplex scan.   I discussed in depth with the patient the nature of atherosclerosis, and emphasized the importance of maximal medical management including strict control of blood pressure, blood glucose, and lipid levels, obtaining regular exercise, and continued cessation of smoking.  The patient is aware that without maximal medical management the underlying atherosclerotic disease process will progress, limiting the benefit of any interventions. The patient was given information about stroke prevention and what symptoms should prompt the patient to seek immediate medical care. Thank you for allowing [place] to participate in this patient's care.  Clemon Chambers, RN, MSN, FNP-C [facility] Office: [phone]  Clinic Physician: Oneida Alar  09/19/2013 2:04 PM

## 2013-10-10 ENCOUNTER — Encounter: Payer: Self-pay | Admitting: Internal Medicine

## 2013-10-10 ENCOUNTER — Ambulatory Visit (INDEPENDENT_AMBULATORY_CARE_PROVIDER_SITE_OTHER): Payer: Medicare Other | Admitting: Internal Medicine

## 2013-10-10 VITALS — BP 130/62 | HR 60 | Ht 73.0 in | Wt 206.0 lb

## 2013-10-10 DIAGNOSIS — Z950 Presence of cardiac pacemaker: Secondary | ICD-10-CM | POA: Insufficient documentation

## 2013-10-10 DIAGNOSIS — I498 Other specified cardiac arrhythmias: Secondary | ICD-10-CM

## 2013-10-10 DIAGNOSIS — I441 Atrioventricular block, second degree: Secondary | ICD-10-CM

## 2013-10-10 DIAGNOSIS — I10 Essential (primary) hypertension: Secondary | ICD-10-CM

## 2013-10-10 NOTE — Assessment & Plan Note (Signed)
His medtronic DDD PM is working normally. Will recheck in several months. 

## 2013-10-10 NOTE — Patient Instructions (Signed)
Your physician wants you to follow-up in: 06/2014 with Dr Knox Saliva will receive a reminder letter in the mail two months in advance. If you don't receive a letter, please call our office to schedule the follow-up appointment.  Remote monitoring is used to monitor your Pacemaker or ICD from home. This monitoring reduces the number of office visits required to check your device to one time per year. It allows [place] to keep an eye on the functioning of your device to ensure it is working properly. You are scheduled for a device check from home on 01/13/14. You may send your transmission at any time that day. If you have a wireless device, the transmission will be sent automatically. After your physician reviews your transmission, you will receive a postcard with your next transmission date.

## 2013-10-10 NOTE — Assessment & Plan Note (Signed)
His blood pressure is well controlled. He will continue his current meds. 

## 2013-10-10 NOTE — Progress Notes (Signed)
HPI [name] returns today for followup. He is a pleasant elderly man with a h/o CHB on presentation several months ago who underwent insertion of a DDD PM. In the interim, he admits to being fairly sedentary. His wife notes that he will not walk with her or go out and exercise. He denies syncope or chest pain. No edema. Allergies  Allergen Reactions  . Ace Inhibitors     REACTION: cough     Current Outpatient Prescriptions  Medication Sig Dispense Refill  . amLODipine (NORVASC) 10 MG tablet TAKE ONE TABLET BY MOUTH EVERY DAY  60 tablet  6  . aspirin 325 MG tablet Take 325 mg by mouth daily.        . Coenzyme Q10 (COQ-10) 200 MG CAPS Take 200 mg by mouth daily.       Marland Kitchen losartan-hydrochlorothiazide (HYZAAR) 100-25 MG per tablet Take 1 tablet by mouth daily.      . metoprolol succinate (TOPROL-XL) 25 MG 24 hr tablet Take 25 mg by mouth daily.      Marland Kitchen omeprazole (PRILOSEC) 20 MG capsule Take 20 mg by mouth daily.      . psyllium (METAMUCIL) 58.6 % powder 2 caps every day       . rosuvastatin (CRESTOR) 20 MG tablet Take 20 mg by mouth at bedtime.       No current facility-administered medications for this visit.     Past Medical History  Diagnosis Date  . ALLERGIC RHINITIS 04/09/2007  . BACTEREMIA, 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  . BENIGN PROSTATIC HYPERTROPHY 04/09/2007  . BRADYCARDIA 05/11/2009  . COLONIC POLYPS, HX OF 04/09/2007    ADENOMATOUS POLYPS 1517,6160  . COPD 04/09/2007  . Cough 03/27/2008  . DEPRESSION 04/09/2007  . DIABETES MELLITUS, TYPE II 10/09/2007  . DIVERTICULOSIS, COLON 04/09/2007  . DIZZINESS 10/06/2008  . GLUCOSE INTOLERANCE 04/09/2007  . HYPERLIPIDEMIA 03/27/2007  . HYPERTENSION 03/27/2007  . PERIPHERAL VASCULAR DISEASE 03/27/2007  . PNEUMONIA ORGANISM NOS 11/08/2007  . SHINGLES, HX OF 04/09/2007  . SHOULDER PAIN, BILATERAL 08/01/2007  . Blood transfusion without reported diagnosis     really not sure thinks 30-40 years ago  . Ulcer       40 years ago  . Complete heart block   . Atrial fibrillation   . Carotid artery occlusion     ROS:   All systems reviewed and negative except as noted in the HPI.   Past Surgical History  Procedure Laterality Date  . Carotid endarterectomy  5/08    left  . S/p pud surgury      ? partial gastrectomy  . Carpal tunnel release      bilateral  . Esophagogastroduodenoscopy  06/07/2004  . Colonoscopy    . Hemorrhoid banding  1990s  . Pacemaker insertion  07/11/2013    MDT ADDRL1 pacemaker implanted by Dr Lovena Le for complete heart block  . Appendectomy      80 years old     Family History  Problem Relation Age of Onset  . Heart disease Other     Cardiovascular disorder, CHF  . Stroke Other     1st degree relative male and male  . Colon cancer Neg Hx   . Hypertension Father   . Ulcers Father   . Hypertension Mother   . Hypertension Sister   . Peripheral vascular disease Brother   . Hypertension Sister   . Ulcers Sister     Ulcers  . Hypertension Sister   .  Cancer Sister     Lung  . Heart disease Sister     Carotid   . Hypertension Sister      History   Social History  . Marital Status: Married    Spouse Name: N/A    Number of Children: 2  . Years of Education: N/A   Occupational History  . 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 Smoking status: Former Smoker    Quit date: 08/20/1999  . Smokeless tobacco: Never Used     Comment: quit 2/01  . Alcohol Use: No  . Drug Use: No  . Sexual Activity: Not on file   Other Topics Concern  . Not on file   Social History Narrative   Daily caffeine      BP 130/62  Pulse 60  Ht 6\' 1"  (1.854 m)  Wt 206 lb (93.441 kg)  BMI 27.18 kg/m2  Physical Exam:  Well appearing 80 yo man, NAD HEENT: Unremarkable Neck:  No JVD, no thyromegally Back:  No CVA tenderness Lungs:  Clear with no wheezes, well healed PM incision.  HEART:  Regular rate rhythm, no murmurs, no rubs, no  clicks Abd:  soft, positive bowel sounds, no organomegally, no rebound, no guarding Ext:  2 plus pulses, no edema, no cyanosis, no clubbing Skin:  No rashes no nodules Neuro:  CN II through XII intact, motor grossly intact   DEVICE  Normal device function.  See PaceArt for details.   Assess/Plan:

## 2013-10-11 LAB — MDC_IDC_ENUM_SESS_TYPE_INCLINIC
Battery Impedance: 100 Ohm
Brady Statistic AS VP Percent: 68 %
Brady Statistic AS VS Percent: 0 %
Lead Channel Impedance Value: 444 Ohm
Lead Channel Impedance Value: 666 Ohm
Lead Channel Pacing Threshold Pulse Width: 0.4 ms
Lead Channel Pacing Threshold Pulse Width: 0.4 ms
Lead Channel Setting Pacing Amplitude: 2 V
Lead Channel Setting Sensing Sensitivity: 4 mV
MDC IDC MSMT BATTERY REMAINING LONGEVITY: 139 mo
MDC IDC MSMT BATTERY VOLTAGE: 2.78 V
MDC IDC MSMT LEADCHNL RA PACING THRESHOLD AMPLITUDE: 0.5 V
MDC IDC MSMT LEADCHNL RA SENSING INTR AMPL: 4 mV
MDC IDC MSMT LEADCHNL RV PACING THRESHOLD AMPLITUDE: 0.5 V
MDC IDC MSMT LEADCHNL RV SENSING INTR AMPL: 15.67 mV
MDC IDC SESS DTM: 20150514191231
MDC IDC SET LEADCHNL RV PACING AMPLITUDE: 2.5 V
MDC IDC SET LEADCHNL RV PACING PULSEWIDTH: 0.4 ms
MDC IDC STAT BRADY AP VP PERCENT: 32 %
MDC IDC STAT BRADY AP VS PERCENT: 0 %

## 2013-10-15 ENCOUNTER — Encounter: Payer: Self-pay | Admitting: Internal Medicine

## 2013-11-13 ENCOUNTER — Other Ambulatory Visit (INDEPENDENT_AMBULATORY_CARE_PROVIDER_SITE_OTHER): Payer: Medicare Other

## 2013-11-13 DIAGNOSIS — E119 Type 2 diabetes mellitus without complications: Secondary | ICD-10-CM

## 2013-11-13 LAB — LIPID PANEL
Cholesterol: 139 mg/dL (ref 0–200)
HDL: 34.3 mg/dL — ABNORMAL LOW (ref >39.00)
LDL Cholesterol: 80 mg/dL (ref 0–99)
NONHDL: 104.7
Total CHOL/HDL Ratio: 4
Triglycerides: 124 mg/dL (ref 0.0–149.0)
VLDL: 24.8 mg/dL (ref 0.0–40.0)

## 2013-11-13 LAB — BASIC METABOLIC PANEL
BUN: 23 mg/dL (ref 6–23)
CALCIUM: 8.8 mg/dL (ref 8.4–10.5)
CO2: 26 mEq/L (ref 19–32)
Chloride: 106 mEq/L (ref 96–112)
Creatinine, Ser: 1.3 mg/dL (ref 0.4–1.5)
GFR: 57.74 mL/min — ABNORMAL LOW (ref >60.00)
Glucose, Bld: 132 mg/dL — ABNORMAL HIGH (ref 70–99)
Potassium: 4.2 mEq/L (ref 3.5–5.1)
SODIUM: 139 meq/L (ref 135–145)

## 2013-11-13 LAB — HEPATIC FUNCTION PANEL
ALK PHOS: 58 U/L (ref 39–117)
ALT: 20 U/L (ref 0–53)
AST: 23 U/L (ref 0–37)
Albumin: 4.2 g/dL (ref 3.5–5.2)
BILIRUBIN DIRECT: 0.1 mg/dL (ref 0.0–0.3)
Total Bilirubin: 0.5 mg/dL (ref 0.2–1.2)
Total Protein: 7.6 g/dL (ref 6.0–8.3)

## 2013-11-13 LAB — HEMOGLOBIN A1C: Hgb A1c MFr Bld: 6.8 % — ABNORMAL HIGH (ref 4.6–6.5)

## 2013-11-19 ENCOUNTER — Encounter: Payer: Self-pay | Admitting: Internal Medicine

## 2013-11-19 ENCOUNTER — Ambulatory Visit (INDEPENDENT_AMBULATORY_CARE_PROVIDER_SITE_OTHER): Payer: Medicare Other | Admitting: Internal Medicine

## 2013-11-19 VITALS — BP 142/70 | HR 70 | Temp 98.0°F | Ht 73.0 in | Wt 209.5 lb

## 2013-11-19 DIAGNOSIS — I10 Essential (primary) hypertension: Secondary | ICD-10-CM

## 2013-11-19 DIAGNOSIS — Z0000 Encounter for general adult medical examination without abnormal findings: Secondary | ICD-10-CM

## 2013-11-19 DIAGNOSIS — E785 Hyperlipidemia, unspecified: Secondary | ICD-10-CM

## 2013-11-19 DIAGNOSIS — E119 Type 2 diabetes mellitus without complications: Secondary | ICD-10-CM

## 2013-11-19 NOTE — Patient Instructions (Signed)
Please continue all other medications as before, and refills have been done if requested.  Please have the pharmacy call with any other refills you may need.  Please continue your efforts at being more active, low cholesterol diet, and weight control.  You are otherwise up to date with prevention measures today.  Please keep your appointments with your specialists as you may have planned  Please return in 6 months, or sooner if needed, with Lab testing done 3-5 days before  

## 2013-11-19 NOTE — Assessment & Plan Note (Signed)
stable overall by history and exam, recent data reviewed with pt, and pt to continue medical treatment as before,  to f/u any worsening symptoms or concerns Lab Results  Component Value Date   HGBA1C 6.8* 11/13/2013

## 2013-11-19 NOTE — Progress Notes (Signed)
Pre visit review using our clinic review tool, if applicable. No additional management support is needed unless otherwise documented below in the visit note. 

## 2013-11-19 NOTE — Progress Notes (Signed)
Subjective:    Patient ID: Daniel Woodard, male    DOB: 1929/12/31, 80 y.o.   MRN: [id number]  HPI  Here to f/u; overall doing ok,  Pt denies chest pain, increased sob or doe, wheezing, orthopnea, PND, increased LE swelling, palpitations, dizziness or syncope.  Pt denies polydipsia, polyuria, or low sugar symptoms such as weakness or confusion improved with po intake.  Pt denies new neurological symptoms such as new headache, or facial or extremity weakness or numbness.   Pt states overall good compliance with meds, has been trying to follow lower cholesterol, diabetic diet, with wt overall stable,  but little exercise however. S/p PPm, doing well. Past Medical History  Diagnosis Date  . ALLERGIC RHINITIS 04/09/2007  . BACTEREMIA, 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  . BENIGN PROSTATIC HYPERTROPHY 04/09/2007  . BRADYCARDIA 05/11/2009  . COLONIC POLYPS, HX OF 04/09/2007    ADENOMATOUS POLYPS 8338,2505  . COPD 04/09/2007  . Cough 03/27/2008  . DEPRESSION 04/09/2007  . DIABETES MELLITUS, TYPE II 10/09/2007  . DIVERTICULOSIS, COLON 04/09/2007  . DIZZINESS 10/06/2008  . GLUCOSE INTOLERANCE 04/09/2007  . HYPERLIPIDEMIA 03/27/2007  . HYPERTENSION 03/27/2007  . PERIPHERAL VASCULAR DISEASE 03/27/2007  . PNEUMONIA ORGANISM NOS 11/08/2007  . SHINGLES, HX OF 04/09/2007  . SHOULDER PAIN, BILATERAL 08/01/2007  . Blood transfusion without reported diagnosis     really not sure thinks 30-40 years ago  . Ulcer     40 years ago  . Complete heart block   . Atrial fibrillation   . Carotid artery occlusion    Past Surgical History  Procedure Laterality Date  . Carotid endarterectomy  5/08    left  . S/p pud surgury      ? partial gastrectomy  . Carpal tunnel release      bilateral  . Esophagogastroduodenoscopy  06/07/2004  . Colonoscopy    . Hemorrhoid banding  1990s  . Pacemaker insertion  07/11/2013    MDT ADDRL1 pacemaker implanted by Dr Lovena Le for complete heart block  . Appendectomy      80 years old    reports that he quit smoking about 14 years ago. He has never used smokeless tobacco. He reports that he does not drink alcohol or use illicit drugs. family history includes Cancer in his sister; Heart disease in his other and sister; Hypertension in his father, mother, sister, sister, sister, and sister; Peripheral vascular disease in his brother; Stroke in his other; Ulcers in his father and sister. There is no history of Colon cancer. Allergies  Allergen Reactions  . Ace Inhibitors     REACTION: cough   Current Outpatient Prescriptions on File Prior to Visit  Medication Sig Dispense Refill  . amLODipine (NORVASC) 10 MG tablet TAKE ONE TABLET BY MOUTH EVERY DAY  60 tablet  6  . aspirin 325 MG tablet Take 325 mg by mouth daily.        . Coenzyme Q10 (COQ-10) 200 MG CAPS Take 200 mg by mouth daily.       Marland Kitchen losartan-hydrochlorothiazide (HYZAAR) 100-25 MG per tablet Take 1 tablet by mouth daily.      . metoprolol succinate (TOPROL-XL) 25 MG 24 hr tablet Take 25 mg by mouth daily.      Marland Kitchen omeprazole (PRILOSEC) 20 MG capsule Take 20 mg by mouth daily.      . psyllium (METAMUCIL) 58.6 % powder 2 caps every day       . rosuvastatin (CRESTOR) 20 MG tablet Take 20 mg  by mouth at bedtime.       No current facility-administered medications on file prior to visit.    Review of Systems  Constitutional: Negative for unusual diaphoresis or other sweats  HENT: Negative for ringing in ear Eyes: Negative for double vision or worsening visual disturbance.  Respiratory: Negative for choking and stridor.   Gastrointestinal: Negative for vomiting or other signifcant bowel change Genitourinary: Negative for hematuria or decreased urine volume.  Musculoskeletal: Negative for other MSK pain or swelling Skin: Negative for color change and worsening wound.  Neurological: Negative for tremors and numbness other than noted  Psychiatric/Behavioral: Negative for decreased concentration or agitation  other than above       Objective:   Physical Exam BP 142/70  Pulse 70  Temp(Src) 98 F (36.7 C) (Oral)  Ht 6\' 1"  (1.854 m)  Wt 209 lb 8 oz (95.029 kg)  BMI 27.65 kg/m2  SpO2 92% VS noted,  Constitutional: Pt appears well-developed, well-nourished.  HENT: Head: NCAT.  Right Ear: External ear normal.  Left Ear: External ear normal.  Eyes: . Pupils are equal, round, and reactive to light. Conjunctivae and EOM are normal Neck: Normal range of motion. Neck supple.  Cardiovascular: Normal rate and regular rhythm.   Pulmonary/Chest: Effort normal and breath sounds normal.  Abd:  Soft, NT, ND, + BS Neurological: Pt is alert. Not confused , motor grossly intact Skin: Skin is warm. No rash Psychiatric: Pt behavior is normal. No agitation.     Assessment & Plan:

## 2013-11-19 NOTE — Assessment & Plan Note (Signed)
stable overall by history and exam, recent data reviewed with pt, and pt to continue medical treatment as before,  to f/u any worsening symptoms or concerns Lab Results  Component Value Date   LDLCALC 80 11/13/2013

## 2013-11-19 NOTE — Assessment & Plan Note (Addendum)
Left arm BP 108/60, stable overall by history and exam, recent data reviewed with pt, and pt to continue medical treatment as before,  to f/u any worsening symptoms or concerns BP Readings from Last 3 Encounters:  11/19/13 142/70  10/10/13 130/62  09/19/13 127/79

## 2013-12-09 ENCOUNTER — Other Ambulatory Visit: Payer: Self-pay | Admitting: Internal Medicine

## 2014-01-13 ENCOUNTER — Ambulatory Visit (INDEPENDENT_AMBULATORY_CARE_PROVIDER_SITE_OTHER): Payer: Medicare Other | Admitting: *Deleted

## 2014-01-13 DIAGNOSIS — I441 Atrioventricular block, second degree: Secondary | ICD-10-CM

## 2014-01-13 DIAGNOSIS — I498 Other specified cardiac arrhythmias: Secondary | ICD-10-CM

## 2014-01-13 LAB — MDC_IDC_ENUM_SESS_TYPE_REMOTE
Battery Impedance: 100 Ohm
Battery Remaining Longevity: 155 mo
Battery Voltage: 2.77 V
Brady Statistic AP VS Percent: 24 %
Brady Statistic AS VP Percent: 12 %
Date Time Interrogation Session: 20150817135204
Lead Channel Pacing Threshold Amplitude: 0.5 V
Lead Channel Pacing Threshold Pulse Width: 0.4 ms
Lead Channel Sensing Intrinsic Amplitude: 2.8 mV
Lead Channel Setting Pacing Amplitude: 2 V
Lead Channel Setting Pacing Amplitude: 2.5 V
Lead Channel Setting Pacing Pulse Width: 0.4 ms
MDC IDC MSMT LEADCHNL RA IMPEDANCE VALUE: 450 Ohm
MDC IDC MSMT LEADCHNL RA PACING THRESHOLD PULSEWIDTH: 0.4 ms
MDC IDC MSMT LEADCHNL RV IMPEDANCE VALUE: 613 Ohm
MDC IDC MSMT LEADCHNL RV PACING THRESHOLD AMPLITUDE: 0.75 V
MDC IDC SET LEADCHNL RV SENSING SENSITIVITY: 4 mV
MDC IDC STAT BRADY AP VP PERCENT: 4 %
MDC IDC STAT BRADY AS VS PERCENT: 60 %

## 2014-01-13 NOTE — Progress Notes (Signed)
Remote pacemaker transmission.   

## 2014-01-14 ENCOUNTER — Telehealth: Payer: Self-pay | Admitting: Internal Medicine

## 2014-01-14 NOTE — Telephone Encounter (Signed)
Patient Information:  Caller Name: Aidin  Phone: [phone]  Patient: Daniel Woodard, Daniel Woodard  Gender: Male  DOB: 11-Apr-1930  Age: 80 Years  PCP: Cathlean Cower (Adults only)  Office Follow Up:  Does the office need to follow up with this patient?: Yes  Instructions For The Office: Pls see RN note  RN Note:  Right & Left Arm Muscle Weakness & Pain, onset 2 months, sxs are worse in Right Arm. Pt feels sxs are related to Crestor d/t side effects.  Pt is skipping med every other day, Pt feels sxs are improving by skipping med.  Pt denies Chest, Jaw pain or SOB. Arm pain protocol, see w/n 3 days d/t moderate pain.  Appt offered, advised Pt if sxs worsen to go to ED.  Pt would like to discuss changing chloesterol med w/ Dr Jenny Reichmann.  Please review w/ MD and f/u w/ Pt.  Symptoms  Reason For Call & Symptoms: Right Arm Weakness & Pain, onset 2 months  Reviewed Health History In EMR: Yes  Reviewed Medications In EMR: Yes  Reviewed Allergies In EMR: Yes  Reviewed Surgeries / Procedures: Yes  Date of Onset of Symptoms: 10/28/2013  Guideline(s) Used:  Arm Pain  Disposition Per Guideline:   See Within 3 Days in Office  Reason For Disposition Reached:   Moderate pain (e.g. interferes with normal activities) and present > 3 days  Advice Given:  N/A  Patient Will Follow Care Advice:  YES

## 2014-01-15 NOTE — Telephone Encounter (Signed)
Ok to offer appt for new onset bilat UE weakness

## 2014-01-23 ENCOUNTER — Encounter: Payer: Self-pay | Admitting: Cardiology

## 2014-01-30 ENCOUNTER — Encounter: Payer: Self-pay | Admitting: Internal Medicine

## 2014-02-04 ENCOUNTER — Other Ambulatory Visit: Payer: Self-pay | Admitting: Internal Medicine

## 2014-02-13 ENCOUNTER — Ambulatory Visit (INDEPENDENT_AMBULATORY_CARE_PROVIDER_SITE_OTHER): Payer: Medicare Other

## 2014-02-13 DIAGNOSIS — Z23 Encounter for immunization: Secondary | ICD-10-CM

## 2014-04-16 ENCOUNTER — Ambulatory Visit (INDEPENDENT_AMBULATORY_CARE_PROVIDER_SITE_OTHER): Payer: Medicare Other | Admitting: *Deleted

## 2014-04-16 ENCOUNTER — Other Ambulatory Visit: Payer: Self-pay | Admitting: Internal Medicine

## 2014-04-16 DIAGNOSIS — I441 Atrioventricular block, second degree: Secondary | ICD-10-CM

## 2014-04-16 NOTE — Progress Notes (Signed)
Remote pacemaker transmission.   

## 2014-04-17 LAB — MDC_IDC_ENUM_SESS_TYPE_REMOTE
Battery Impedance: 100 Ohm
Brady Statistic AP VP Percent: 17 %
Brady Statistic AP VS Percent: 13 %
Brady Statistic AS VS Percent: 31 %
Date Time Interrogation Session: 20151118153615
Lead Channel Impedance Value: 456 Ohm
Lead Channel Impedance Value: 617 Ohm
Lead Channel Pacing Threshold Amplitude: 0.5 V
Lead Channel Pacing Threshold Amplitude: 0.625 V
Lead Channel Pacing Threshold Pulse Width: 0.4 ms
Lead Channel Pacing Threshold Pulse Width: 0.4 ms
Lead Channel Setting Pacing Amplitude: 2.5 V
Lead Channel Setting Sensing Sensitivity: 4 mV
MDC IDC MSMT BATTERY REMAINING LONGEVITY: 146 mo
MDC IDC MSMT BATTERY VOLTAGE: 2.78 V
MDC IDC MSMT LEADCHNL RA SENSING INTR AMPL: 2.8 mV
MDC IDC SET LEADCHNL RA PACING AMPLITUDE: 2 V
MDC IDC SET LEADCHNL RV PACING PULSEWIDTH: 0.4 ms
MDC IDC STAT BRADY AS VP PERCENT: 39 %

## 2014-04-23 ENCOUNTER — Encounter: Payer: Self-pay | Admitting: Cardiology

## 2014-05-08 ENCOUNTER — Encounter (HOSPITAL_COMMUNITY): Payer: Self-pay | Admitting: Internal Medicine

## 2014-05-08 ENCOUNTER — Encounter: Payer: Self-pay | Admitting: Internal Medicine

## 2014-05-15 ENCOUNTER — Other Ambulatory Visit (INDEPENDENT_AMBULATORY_CARE_PROVIDER_SITE_OTHER): Payer: Medicare Other

## 2014-05-15 DIAGNOSIS — I10 Essential (primary) hypertension: Secondary | ICD-10-CM

## 2014-05-15 DIAGNOSIS — E119 Type 2 diabetes mellitus without complications: Secondary | ICD-10-CM

## 2014-05-15 DIAGNOSIS — E785 Hyperlipidemia, unspecified: Secondary | ICD-10-CM

## 2014-05-15 DIAGNOSIS — Z0000 Encounter for general adult medical examination without abnormal findings: Secondary | ICD-10-CM

## 2014-05-15 LAB — URINALYSIS, ROUTINE W REFLEX MICROSCOPIC
Bilirubin Urine: NEGATIVE
Hgb urine dipstick: NEGATIVE
Ketones, ur: NEGATIVE
LEUKOCYTES UA: NEGATIVE
Nitrite: NEGATIVE
RBC / HPF: NONE SEEN (ref 0–?)
Specific Gravity, Urine: >=1.03 — AB (ref 1.000–1.030)
URINE GLUCOSE: NEGATIVE
Urobilinogen, UA: 0.2 (ref 0.0–1.0)
WBC, UA: NONE SEEN (ref 0–?)
pH: 5 (ref 5.0–8.0)

## 2014-05-15 LAB — LIPID PANEL
CHOL/HDL RATIO: 5
Cholesterol: 147 mg/dL (ref 0–200)
HDL: 29.7 mg/dL — AB (ref >39.00)
LDL Cholesterol: 87 mg/dL (ref 0–99)
NONHDL: 117.3
Triglycerides: 151 mg/dL — ABNORMAL HIGH (ref 0.0–149.0)
VLDL: 30.2 mg/dL (ref 0.0–40.0)

## 2014-05-15 LAB — HEPATIC FUNCTION PANEL
ALT: 20 U/L (ref 0–53)
AST: 19 U/L (ref 0–37)
Albumin: 4.2 g/dL (ref 3.5–5.2)
Alkaline Phosphatase: 63 U/L (ref 39–117)
BILIRUBIN DIRECT: 0 mg/dL (ref 0.0–0.3)
BILIRUBIN TOTAL: 0.7 mg/dL (ref 0.2–1.2)
Total Protein: 7.6 g/dL (ref 6.0–8.3)

## 2014-05-15 LAB — BASIC METABOLIC PANEL
BUN: 19 mg/dL (ref 6–23)
CALCIUM: 8.9 mg/dL (ref 8.4–10.5)
CO2: 24 mEq/L (ref 19–32)
CREATININE: 1.3 mg/dL (ref 0.4–1.5)
Chloride: 104 mEq/L (ref 96–112)
GFR: 58.2 mL/min — ABNORMAL LOW (ref >60.00)
Glucose, Bld: 122 mg/dL — ABNORMAL HIGH (ref 70–99)
Potassium: 4.3 mEq/L (ref 3.5–5.1)
Sodium: 138 mEq/L (ref 135–145)

## 2014-05-15 LAB — CBC WITH DIFFERENTIAL/PLATELET
Basophils Absolute: 0 10*3/uL (ref 0.0–0.1)
Basophils Relative: 0.5 % (ref 0.0–3.0)
EOS ABS: 0.2 10*3/uL (ref 0.0–0.7)
Eosinophils Relative: 3.4 % (ref 0.0–5.0)
HCT: 46 % (ref 39.0–52.0)
Hemoglobin: 15.1 g/dL (ref 13.0–17.0)
LYMPHS PCT: 22.4 % (ref 12.0–46.0)
Lymphs Abs: 1.3 10*3/uL (ref 0.7–4.0)
MCHC: 32.7 g/dL (ref 30.0–36.0)
MCV: 91.1 fl (ref 78.0–100.0)
MONO ABS: 0.7 10*3/uL (ref 0.1–1.0)
Monocytes Relative: 12 % (ref 3.0–12.0)
NEUTROS ABS: 3.7 10*3/uL (ref 1.4–7.7)
Neutrophils Relative %: 61.7 % (ref 43.0–77.0)
Platelets: 188 10*3/uL (ref 150.0–400.0)
RBC: 5.05 Mil/uL (ref 4.22–5.81)
RDW: 13.2 % (ref 11.5–15.5)
WBC: 6 10*3/uL (ref 4.0–10.5)

## 2014-05-15 LAB — MICROALBUMIN / CREATININE URINE RATIO
Creatinine,U: 237.6 mg/dL
MICROALB/CREAT RATIO: 2.4 mg/g (ref 0.0–30.0)
Microalb, Ur: 5.8 mg/dL — ABNORMAL HIGH (ref 0.0–1.9)

## 2014-05-15 LAB — HEMOGLOBIN A1C: Hgb A1c MFr Bld: 6.9 % — ABNORMAL HIGH (ref 4.6–6.5)

## 2014-05-15 LAB — TSH: TSH: 3.46 u[IU]/mL (ref 0.35–4.50)

## 2014-05-21 ENCOUNTER — Encounter: Payer: Self-pay | Admitting: Internal Medicine

## 2014-05-21 ENCOUNTER — Ambulatory Visit (INDEPENDENT_AMBULATORY_CARE_PROVIDER_SITE_OTHER): Payer: Medicare Other | Admitting: Internal Medicine

## 2014-05-21 VITALS — BP 122/74 | HR 75 | Temp 98.2°F | Ht 73.0 in | Wt 208.2 lb

## 2014-05-21 DIAGNOSIS — E119 Type 2 diabetes mellitus without complications: Secondary | ICD-10-CM

## 2014-05-21 DIAGNOSIS — Z0000 Encounter for general adult medical examination without abnormal findings: Secondary | ICD-10-CM

## 2014-05-21 NOTE — Patient Instructions (Signed)
Please continue all other medications as before, and refills have been done if requested.  Please have the pharmacy call with any other refills you may need.  Please continue your efforts at being more active, low cholesterol diet, and weight control.  You are otherwise up to date with prevention measures today.  Please keep your appointments with your specialists as you may have planned  Please return in 6 months, or sooner if needed, with Lab testing done 3-5 days before  

## 2014-05-21 NOTE — Progress Notes (Signed)
Subjective:    Patient ID: Daniel Woodard, male    DOB: [birth year], 80 y.o.   MRN: [id number]  HPI  Here for wellness and f/u;  Overall doing ok;  Pt denies CP, worsening SOB, DOE, wheezing, orthopnea, PND, worsening LE edema, palpitations, dizziness or syncope.  Pt denies neurological change such as new headache, facial or extremity weakness.  Pt denies polydipsia, polyuria, or low sugar symptoms. Pt states overall good compliance with treatment and medications, good tolerability, and has been trying to follow lower cholesterol diet.  Pt denies worsening depressive symptoms, suicidal ideation or panic. No fever, night sweats, wt loss, loss of appetite, or other constitutional symptoms.  Pt states good ability with ADL's, has low fall risk, home safety reviewed and adequate, no other significant changes in hearing or vision, and only occasionally active with exercise. No current complaints Past Medical History  Diagnosis Date  . ALLERGIC RHINITIS 04/09/2007  . BACTEREMIA, 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  . BENIGN PROSTATIC HYPERTROPHY 04/09/2007  . BRADYCARDIA 05/11/2009  . COLONIC POLYPS, HX OF 04/09/2007    ADENOMATOUS POLYPS 4580,9983  . COPD 04/09/2007  . Cough 03/27/2008  . DEPRESSION 04/09/2007  . DIABETES MELLITUS, TYPE II 10/09/2007  . DIVERTICULOSIS, COLON 04/09/2007  . DIZZINESS 10/06/2008  . GLUCOSE INTOLERANCE 04/09/2007  . HYPERLIPIDEMIA 03/27/2007  . HYPERTENSION 03/27/2007  . PERIPHERAL VASCULAR DISEASE 03/27/2007  . PNEUMONIA ORGANISM NOS 11/08/2007  . SHINGLES, HX OF 04/09/2007  . SHOULDER PAIN, BILATERAL 08/01/2007  . Blood transfusion without reported diagnosis     really not sure thinks 30-40 years ago  . Ulcer     40 years ago  . Complete heart block   . Atrial fibrillation   . Carotid artery occlusion    Past Surgical History  Procedure Laterality Date  . Carotid endarterectomy  5/08    left  . S/p pud surgury      ? partial gastrectomy  . Carpal  tunnel release      bilateral  . Esophagogastroduodenoscopy  06/07/2004  . Colonoscopy    . Hemorrhoid banding  1990s  . Pacemaker insertion  07/11/2013    MDT ADDRL1 pacemaker implanted by Dr Lovena Le for complete heart block  . Appendectomy      80 years old  . Permanent pacemaker insertion N/A 07/11/2013    Procedure: PERMANENT PACEMAKER INSERTION;  Surgeon: Evans Lance, MD;  Location: [facility] CATH LAB;  Service: Cardiovascular;  Laterality: N/A;    reports that he quit smoking about 14 years ago. He has never used smokeless tobacco. He reports that he does not drink alcohol or use illicit drugs. family history includes Cancer in his sister; Heart disease in his other and sister; Hypertension in his father, mother, sister, sister, sister, and sister; Peripheral vascular disease in his brother; Stroke in his other; Ulcers in his father and sister. There is no history of Colon cancer. Allergies  Allergen Reactions  . Ace Inhibitors     REACTION: cough   Current Outpatient Prescriptions on File Prior to Visit  Medication Sig Dispense Refill  . amLODipine (NORVASC) 10 MG tablet TAKE ONE TABLET BY MOUTH EVERY DAY 60 tablet 6  . aspirin 325 MG tablet Take 325 mg by mouth daily.      . Coenzyme Q10 (COQ-10) 200 MG CAPS Take 200 mg by mouth daily.     . CRESTOR 20 MG tablet TAKE ONE TABLET BY MOUTH AT BEDTIME 30 tablet 6  . losartan-hydrochlorothiazide (HYZAAR) 100-25 MG  per tablet Take 1 tablet by mouth daily.    Marland Kitchen losartan-hydrochlorothiazide (HYZAAR) 100-25 MG per tablet TAKE ONE TABLET BY MOUTH ONCE DAILY 90 tablet 3  . metoprolol succinate (TOPROL-XL) 25 MG 24 hr tablet Take 25 mg by mouth daily.    Marland Kitchen omeprazole (PRILOSEC) 20 MG capsule Take 20 mg by mouth daily.    . psyllium (METAMUCIL) 58.6 % powder 2 caps every day      No current facility-administered medications on file prior to visit.   Review of Systems Constitutional: Negative for increased diaphoresis, other activity, appetite or  other siginficant weight change  HENT: Negative for worsening hearing loss, ear pain, facial swelling, mouth sores and neck stiffness.   Eyes: Negative for other worsening pain, redness or visual disturbance.  Respiratory: Negative for shortness of breath and wheezing.   Cardiovascular: Negative for chest pain and palpitations.  Gastrointestinal: Negative for diarrhea, blood in stool, abdominal distention or other pain Genitourinary: Negative for hematuria, flank pain or change in urine volume.  Musculoskeletal: Negative for myalgias or other joint complaints.  Skin: Negative for color change and wound.  Neurological: Negative for syncope and numbness. other than noted Hematological: Negative for adenopathy. or other swelling Psychiatric/Behavioral: Negative for hallucinations, self-injury, decreased concentration or other worsening agitation.      Objective:   Physical Exam BP 122/74 mmHg  Pulse 75  Temp(Src) 98.2 F (36.8 C) (Oral)  Ht 6\' 1"  (1.854 m)  Wt 208 lb 4 oz (94.462 kg)  BMI 27.48 kg/m2  SpO2 91% VS noted,  Constitutional: Pt is oriented to person, place, and time. Appears well-developed and well-nourished.  Head: Normocephalic and atraumatic.  Right Ear: External ear normal.  Left Ear: External ear normal.  Nose: Nose normal.  Mouth/Throat: Oropharynx is clear and moist.  Eyes: Conjunctivae and EOM are normal. Pupils are equal, round, and reactive to light.  Neck: Normal range of motion. Neck supple. No JVD present. No tracheal deviation present.  Cardiovascular: Normal rate, regular rhythm, normal heart sounds and intact distal pulses.   Pulmonary/Chest: Effort normal and breath sounds without rales or wheezing  Abdominal: Soft. Bowel sounds are normal. NT. No HSM  Musculoskeletal: Normal range of motion. Exhibits no edema.  Lymphadenopathy:  Has no cervical adenopathy.  Neurological: Pt is alert and oriented to person, place, and time. Pt has normal reflexes. No  cranial nerve deficit. Motor grossly intact Skin: Skin is warm and dry. No rash noted.  Psychiatric:  Has normal mood and affect. Behavior is normal.     Assessment & Plan:

## 2014-05-21 NOTE — Progress Notes (Signed)
Pre visit review using our clinic review tool, if applicable. No additional management support is needed unless otherwise documented below in the visit note. 

## 2014-05-25 NOTE — Assessment & Plan Note (Signed)
stable overall by history and exam, recent data reviewed with pt, and pt to continue medical treatment as before,  to f/u any worsening symptoms or concerns Lab Results  Component Value Date   HGBA1C 6.9* 05/15/2014

## 2014-05-25 NOTE — Assessment & Plan Note (Signed)

## 2014-06-12 ENCOUNTER — Other Ambulatory Visit: Payer: Self-pay | Admitting: Internal Medicine

## 2014-07-08 ENCOUNTER — Ambulatory Visit (INDEPENDENT_AMBULATORY_CARE_PROVIDER_SITE_OTHER): Payer: Medicare Other | Admitting: Internal Medicine

## 2014-07-08 ENCOUNTER — Encounter: Payer: Self-pay | Admitting: Internal Medicine

## 2014-07-08 VITALS — BP 150/64 | HR 71 | Ht 73.0 in | Wt 208.0 lb

## 2014-07-08 DIAGNOSIS — I10 Essential (primary) hypertension: Secondary | ICD-10-CM

## 2014-07-08 DIAGNOSIS — I739 Peripheral vascular disease, unspecified: Secondary | ICD-10-CM

## 2014-07-08 DIAGNOSIS — I441 Atrioventricular block, second degree: Secondary | ICD-10-CM

## 2014-07-08 DIAGNOSIS — Z950 Presence of cardiac pacemaker: Secondary | ICD-10-CM

## 2014-07-08 LAB — MDC_IDC_ENUM_SESS_TYPE_INCLINIC
Battery Impedance: 100 Ohm
Brady Statistic AP VP Percent: 15 %
Brady Statistic AP VS Percent: 14 %
Brady Statistic AS VP Percent: 34 %
Brady Statistic AS VS Percent: 37 %
Date Time Interrogation Session: 20160209103122
Lead Channel Impedance Value: 462 Ohm
Lead Channel Impedance Value: 608 Ohm
Lead Channel Pacing Threshold Amplitude: 1 V
Lead Channel Pacing Threshold Pulse Width: 0.4 ms
Lead Channel Sensing Intrinsic Amplitude: 22.4 mV
Lead Channel Sensing Intrinsic Amplitude: 5.6 mV
Lead Channel Setting Pacing Pulse Width: 0.4 ms
MDC IDC MSMT BATTERY REMAINING LONGEVITY: 148 mo
MDC IDC MSMT BATTERY VOLTAGE: 2.78 V
MDC IDC MSMT LEADCHNL RV PACING THRESHOLD AMPLITUDE: 0.75 V
MDC IDC MSMT LEADCHNL RV PACING THRESHOLD PULSEWIDTH: 0.4 ms
MDC IDC SET LEADCHNL RA PACING AMPLITUDE: 2 V
MDC IDC SET LEADCHNL RV PACING AMPLITUDE: 2.5 V
MDC IDC SET LEADCHNL RV SENSING SENSITIVITY: 4 mV

## 2014-07-08 NOTE — Assessment & Plan Note (Signed)
His medtronic DDD PM is working normally. Will recheck in several months. 

## 2014-07-08 NOTE — Assessment & Plan Note (Signed)
His blood pressure is elevated. He will continue his current meds and reduce his sodium intake. I have asked him to increase his physical activity.

## 2014-07-08 NOTE — Assessment & Plan Note (Signed)
He denies claudication. No change in meds.

## 2014-07-08 NOTE — Progress Notes (Signed)
HPI [name] returns today for followup. He is a pleasant elderly man with a h/o CHB on presentation a year ago who underwent insertion of a DDD PM. In the interim, he admits to being fairly sedentary, however he has tried to increase his activity. He denies syncope or chest pain. No edema. Allergies  Allergen Reactions  . Ace Inhibitors     REACTION: cough     Current Outpatient Prescriptions  Medication Sig Dispense Refill  . amLODipine (NORVASC) 10 MG tablet TAKE ONE TABLET BY MOUTH EVERY DAY 60 tablet 6  . aspirin 325 MG tablet Take 325 mg by mouth daily.      . Coenzyme Q10 (COQ-10) 200 MG CAPS Take 200 mg by mouth daily.     . CRESTOR 20 MG tablet TAKE ONE TABLET BY MOUTH AT BEDTIME 30 tablet 6  . losartan-hydrochlorothiazide (HYZAAR) 100-25 MG per tablet Take 1 tablet by mouth daily.    Marland Kitchen losartan-hydrochlorothiazide (HYZAAR) 100-25 MG per tablet TAKE ONE TABLET BY MOUTH ONCE DAILY 90 tablet 3  . metoprolol succinate (TOPROL-XL) 25 MG 24 hr tablet Take 25 mg by mouth daily.    . metoprolol succinate (TOPROL-XL) 25 MG 24 hr tablet TAKE ONE TABLET BY MOUTH ONCE DAILY 90 tablet 3  . omeprazole (PRILOSEC) 20 MG capsule Take 20 mg by mouth daily.    . psyllium (METAMUCIL) 58.6 % powder 2 caps every day      No current facility-administered medications for this visit.     Past Medical History  Diagnosis Date  . ALLERGIC RHINITIS 04/09/2007  . BACTEREMIA, 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  . BENIGN PROSTATIC HYPERTROPHY 04/09/2007  . BRADYCARDIA 05/11/2009  . COLONIC POLYPS, HX OF 04/09/2007    ADENOMATOUS POLYPS 2440,1027  . COPD 04/09/2007  . Cough 03/27/2008  . DEPRESSION 04/09/2007  . DIABETES MELLITUS, TYPE II 10/09/2007  . DIVERTICULOSIS, COLON 04/09/2007  . DIZZINESS 10/06/2008  . GLUCOSE INTOLERANCE 04/09/2007  . HYPERLIPIDEMIA 03/27/2007  . HYPERTENSION 03/27/2007  . PERIPHERAL VASCULAR DISEASE 03/27/2007  . PNEUMONIA ORGANISM NOS 11/08/2007  .  SHINGLES, HX OF 04/09/2007  . SHOULDER PAIN, BILATERAL 08/01/2007  . Blood transfusion without reported diagnosis     really not sure thinks 30-40 years ago  . Ulcer     40 years ago  . Complete heart block   . Atrial fibrillation   . Carotid artery occlusion     ROS:   All systems reviewed and negative except as noted in the HPI.   Past Surgical History  Procedure Laterality Date  . Carotid endarterectomy  5/08    left  . S/p pud surgury      ? partial gastrectomy  . Carpal tunnel release      bilateral  . Esophagogastroduodenoscopy  06/07/2004  . Colonoscopy    . Hemorrhoid banding  1990s  . Pacemaker insertion  07/11/2013    MDT ADDRL1 pacemaker implanted by Dr Lovena Le for complete heart block  . Appendectomy      81 years old  . Permanent pacemaker insertion N/A 07/11/2013    Procedure: PERMANENT PACEMAKER INSERTION;  Surgeon: Evans Lance, MD;  Location: [facility] CATH [facility];  Service: Cardiovascular;  Laterality: N/A;     Family History  Problem Relation Age of Onset  . Heart disease Other     Cardiovascular disorder, CHF  . Stroke Other     1st degree relative male and male  . Colon cancer Neg Hx   .  Hypertension Father   . Ulcers Father   . Hypertension Mother   . Hypertension Sister   . Peripheral vascular disease Brother   . Hypertension Sister   . Ulcers Sister     Ulcers  . Hypertension Sister   . Cancer Sister     Lung  . Heart disease Sister     Carotid   . Hypertension Sister      History   Social History  . Marital Status: Married    Spouse Name: N/A    Number of Children: 2  . Years of Education: N/A   Occupational History  . 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 Smoking status: Former Smoker    Quit date: 08/20/1999  . Smokeless tobacco: Never Used     Comment: quit 2/01  . Alcohol Use: No  . Drug Use: No  . Sexual Activity: Not on file   Other Topics Concern  . Not on file   Social History  Narrative   Daily caffeine      BP 150/64 mmHg  Pulse 71  Ht 6\' 1"  (1.854 m)  Wt 208 lb (94.348 kg)  BMI 27.45 kg/m2  Physical Exam:  Well appearing 81 yo man, NAD HEENT: Unremarkable Neck:  No JVD, no thyromegally Back:  No CVA tenderness Lungs:  Clear with no wheezes, well healed PM incision.  HEART:  Regular rate rhythm, no murmurs, no rubs, no clicks Abd:  soft, positive bowel sounds, no organomegally, no rebound, no guarding Ext:  2 plus pulses, no edema, no cyanosis, no clubbing Skin:  No rashes no nodules Neuro:  CN II through XII intact, motor grossly intact   DEVICE  Normal device function.  See PaceArt for details.   Assess/Plan:

## 2014-07-08 NOTE — Patient Instructions (Signed)
Remote monitoring is used to monitor your Pacemaker of ICD from home. This monitoring reduces the number of office visits required to check your device to one time per year. It allows [place] to keep an eye on the functioning of your device to ensure it is working properly. You are scheduled for a device check from home on 10/07/14. You may send your transmission at any time that day. If you have a wireless device, the transmission will be sent automatically. After your physician reviews your transmission, you will receive a postcard with your next transmission date.  Your physician wants you to follow-up in: 1 year with Dr. Lovena Le.   You will receive a reminder letter in the mail two months in advance. If you don't receive a letter, please call our office to schedule the follow-up appointment. Your physician recommends that you continue on your current medications as directed. Please refer to the Current Medication list given to you today.

## 2014-07-16 ENCOUNTER — Other Ambulatory Visit: Payer: Self-pay | Admitting: Internal Medicine

## 2014-08-11 ENCOUNTER — Ambulatory Visit (INDEPENDENT_AMBULATORY_CARE_PROVIDER_SITE_OTHER): Payer: Medicare Other | Admitting: Internal Medicine

## 2014-08-11 ENCOUNTER — Ambulatory Visit (INDEPENDENT_AMBULATORY_CARE_PROVIDER_SITE_OTHER)
Admission: RE | Admit: 2014-08-11 | Discharge: 2014-08-11 | Disposition: A | Discharge status: Home / Self Care | Payer: Medicare Other | Source: Ambulatory Visit | Attending: Internal Medicine | Admitting: Internal Medicine

## 2014-08-11 ENCOUNTER — Encounter: Payer: Self-pay | Admitting: Internal Medicine

## 2014-08-11 DIAGNOSIS — J449 Chronic obstructive pulmonary disease, unspecified: Secondary | ICD-10-CM

## 2014-08-11 NOTE — Progress Notes (Signed)
   Subjective:    Patient ID: Daniel Woodard, male    DOB: 11/24/1929    MRN: [id number]  HPI  89 yowm quit smoking 2001 self referred 08/11/14 to pulmonary clinic for lung ca screening   08/11/2014 [facility] office visit/ Daniel Woodard   Chief Complaint  Patient presents with  . Pulmonary Consult    Pt last seen here in June 2009.He states he has had 2 sisters develop lung CA and wants a screening for this. He states that he gets SOB occ, but is not limited by his breathing.   doe x inclines no steps at home but does ok slow and flat x malls ok  Cough x worse in am and winter > scant white mucus never bloody couple  time a day no more than a tsp Not using any inhalers    No obvious other patterns in day to day or daytime variabilty or assoc  cp or chest tightness, subjective wheeze overt sinus or hb symptoms. No unusual exp hx or h/o childhood pna/ asthma or knowledge of premature birth.  Sleeping ok without nocturnal  or early am exacerbation  of respiratory  c/o's or need for noct saba. Also denies any obvious fluctuation of symptoms with weather or environmental changes or other aggravating or alleviating factors except as outlined above   Current Medications, Allergies, Complete Past Medical History, Past Surgical History, Family History, and Social History were reviewed in [employer] record.              Review of Systems  Constitutional: Negative for fever, chills, activity change, appetite change and unexpected weight change.  HENT: Negative for congestion, dental problem, postnasal drip, rhinorrhea, sneezing, sore throat, trouble swallowing and voice change.   Eyes: Negative for visual disturbance.  Respiratory: Positive for cough and shortness of breath. Negative for choking.   Cardiovascular: Negative for chest pain and leg swelling.  Gastrointestinal: Negative for nausea, vomiting and abdominal pain.  Genitourinary: Negative for difficulty  urinating.  Musculoskeletal: Negative for arthralgias.  Skin: Negative for rash.  Psychiatric/Behavioral: Negative for behavioral problems and confusion.       Objective:   Physical Exam  Wt Readings from Last 3 Encounters:  08/11/14 205 lb (92.987 kg)  07/08/14 208 lb (94.348 kg)  05/21/14 208 lb 4 oz (94.462 kg)    Vital signs reviewed    HEENT: full dentures/ nl  turbinates, and orophanx. Nl external ear canals without cough reflex   NECK :  without JVD/Nodes/TM/ nl carotid upstrokes bilaterally   LUNGS: no acc muscle use, distant bs s wheeze/ slt barrel chest    CV:  RRR  no s3 or murmur or increase in P2, no edema   ABD:  soft and nontender with Pos hoover late insp. No bruits or organomegaly, bowel sounds nl  MS:  warm without deformities, calf tenderness, cyanosis or clubbing  SKIN: warm and dry without lesions    NEURO:  alert, approp, no deficits      CXR PA and Lateral:   08/11/2014 :     I personally reviewed images and agree with radiology impression as follows:    COPD. No active disease.      Assessment & Plan:

## 2014-08-11 NOTE — Patient Instructions (Addendum)
Please remember to go to the x-ray department downstairs for your tests - we will call you with the results when they are available.  Please schedule a follow up visit in 3 months but call sooner if needed with pfts

## 2014-08-12 ENCOUNTER — Encounter: Payer: Self-pay | Admitting: Internal Medicine

## 2014-08-12 NOTE — Progress Notes (Signed)
Quick Note:  Spoke with pt and notified of results per Dr. Wert. Pt verbalized understanding and denied any questions.  ______ 

## 2014-08-12 NOTE — Assessment & Plan Note (Signed)
He has mild dz assoc with CB but not enough symptoms to warrant rx at this point  In terms of lung cancer screening, he is well beyond the range studied for efficacy for screening CT and has nothing on cxr to suggest he has a tumor that will impact his life expectancy at age 81 so no CT warranted at this point   Discussed in detail all the  indications, usual  risks and alternatives  relative to the benefits with patient who agrees to proceed with cxr screening only > negative  See instructions for specific recommendations which were reviewed directly with the patient who was given a copy with highlighter outlining the key components.

## 2014-08-19 ENCOUNTER — Encounter: Payer: Self-pay | Admitting: Internal Medicine

## 2014-09-24 ENCOUNTER — Encounter: Payer: Self-pay | Admitting: Family

## 2014-09-25 ENCOUNTER — Ambulatory Visit (INDEPENDENT_AMBULATORY_CARE_PROVIDER_SITE_OTHER): Payer: Medicare Other | Admitting: Family

## 2014-09-25 ENCOUNTER — Ambulatory Visit (HOSPITAL_COMMUNITY)
Admission: RE | Admit: 2014-09-25 | Discharge: 2014-09-25 | Disposition: A | Discharge status: Home / Self Care | Payer: Medicare Other | Source: Ambulatory Visit | Attending: Family | Admitting: Family

## 2014-09-25 ENCOUNTER — Encounter: Payer: Self-pay | Admitting: Family

## 2014-09-25 VITALS — BP 147/73 | HR 61 | Resp 18 | Ht 73.0 in | Wt 208.0 lb

## 2014-09-25 DIAGNOSIS — Z87891 Personal history of nicotine dependence: Secondary | ICD-10-CM | POA: Insufficient documentation

## 2014-09-25 DIAGNOSIS — Z48812 Encounter for surgical aftercare following surgery on the circulatory system: Secondary | ICD-10-CM

## 2014-09-25 DIAGNOSIS — I6523 Occlusion and stenosis of bilateral carotid arteries: Secondary | ICD-10-CM

## 2014-09-25 DIAGNOSIS — Z9889 Other specified postprocedural states: Secondary | ICD-10-CM | POA: Diagnosis not present

## 2014-09-25 DIAGNOSIS — I6521 Occlusion and stenosis of right carotid artery: Secondary | ICD-10-CM | POA: Diagnosis not present

## 2014-09-25 NOTE — Progress Notes (Signed)
Filed Vitals:   09/25/14 1435 09/25/14 1441  BP: 95/71 147/73  Pulse: 70 61  Resp: 18   Height: 6\' 1"  (1.854 m)   Weight: 208 lb (94.348 kg)    Body mass index is 27.45 kg/(m^2).

## 2014-09-25 NOTE — Progress Notes (Signed)
Established Carotid Patient   History of Present Illness  Daniel Woodard is a 81 y.o. male patient of Dr. Oneida Alar who presents for follow-up evaluation for carotid stenosis. He previously had a left carotid endarterectomy by Dr. Amedeo Plenty in February of 2001.  Patient has no history of TIA or stroke symptom; specifically the patient denies: amaurosis fugax or monocular blindness, facial drooping, hemiplegia, receptive or expressive aphasia.   He has a pacemaker inserted in February, 2015 for heart block.  He denies steal symptoms in either hand/arm, denies dizziness when reaching overhead.  Pt Diabetic: No, he states he is not, but + in his PMHX Pt smoker: former smoker, quit in 2001  Pt meds include: Statin : Yes ASA: Yes Other anticoagulants/antiplatelets: no   Past Medical History  Diagnosis Date  . ALLERGIC RHINITIS 04/09/2007  . BACTEREMIA, 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  . BENIGN PROSTATIC HYPERTROPHY 04/09/2007  . BRADYCARDIA 05/11/2009  . COLONIC POLYPS, HX OF 04/09/2007    ADENOMATOUS POLYPS 8657,8469  . COPD 04/09/2007  . Cough 03/27/2008  . DEPRESSION 04/09/2007  . DIABETES MELLITUS, TYPE II 10/09/2007  . DIVERTICULOSIS, COLON 04/09/2007  . DIZZINESS 10/06/2008  . GLUCOSE INTOLERANCE 04/09/2007  . HYPERLIPIDEMIA 03/27/2007  . HYPERTENSION 03/27/2007  . PERIPHERAL VASCULAR DISEASE 03/27/2007  . PNEUMONIA ORGANISM NOS 11/08/2007  . SHINGLES, HX OF 04/09/2007  . SHOULDER PAIN, BILATERAL 08/01/2007  . Blood transfusion without reported diagnosis     really not sure thinks 30-40 years ago  . Ulcer     40 years ago  . Complete heart block   . Atrial fibrillation   . Carotid artery occlusion     Social History History  Substance Use Topics  . Smoking status: Former Smoker -- 1.00 packs/day for 50 years    Types: Cigarettes    Quit date: 08/20/1999  . Smokeless tobacco: Never Used  . Alcohol Use: No    Family History Family History  Problem Relation  Age of Onset  . Heart disease Other     Cardiovascular disorder, CHF  . Stroke Other     1st degree relative male and male  . Colon cancer Neg Hx   . Hypertension Father   . Ulcers Father   . Hypertension Mother   . Hypertension Sister   . Peripheral vascular disease Brother   . Hypertension Sister   . Ulcers Sister     Ulcers  . Hypertension Sister   . Lung cancer Sister     smoked  . Heart disease Sister     Carotid   . Hypertension Sister     Surgical History Past Surgical History  Procedure Laterality Date  . Carotid endarterectomy  5/08    left  . S/p pud surgury      ? partial gastrectomy  . Carpal tunnel release      bilateral  . Esophagogastroduodenoscopy  06/07/2004  . Colonoscopy    . Hemorrhoid banding  1990s  . Pacemaker insertion  07/11/2013    MDT ADDRL1 pacemaker implanted by Dr Lovena Le for complete heart block  . Appendectomy      81 years old  . Permanent pacemaker insertion N/A 07/11/2013    Procedure: PERMANENT PACEMAKER INSERTION;  Surgeon: Evans Lance, MD;  Location: [facility] CATH LAB;  Service: Cardiovascular;  Laterality: N/A;    Allergies  Allergen Reactions  . Ace Inhibitors     REACTION: cough    Current Outpatient Prescriptions  Medication Sig Dispense Refill  . amLODipine (  NORVASC) 10 MG tablet TAKE ONE TABLET BY MOUTH EVERY DAY 60 tablet 6  . aspirin 325 MG tablet Take 325 mg by mouth daily.      . Coenzyme Q10 (COQ-10) 200 MG CAPS Take 200 mg by mouth daily.     . CRESTOR 20 MG tablet TAKE ONE TABLET BY MOUTH AT BEDTIME 30 tablet 6  . losartan-hydrochlorothiazide (HYZAAR) 100-25 MG per tablet Take 1 tablet by mouth daily.    . metoprolol succinate (TOPROL-XL) 25 MG 24 hr tablet Take 25 mg by mouth daily.    Marland Kitchen omeprazole (PRILOSEC) 20 MG capsule Take 1 capsule (20 mg total) by mouth daily. 90 capsule 1  . psyllium (METAMUCIL) 58.6 % powder 2 caps every day      No current facility-administered medications for this visit.    Review of  Systems : See HPI for pertinent positives and negatives.  Physical Examination  Filed Vitals:   09/25/14 1435 09/25/14 1441  BP: 95/71 147/73  Pulse: 70 61  Resp: 18   Height: 6\' 1"  (1.854 m)   Weight: 208 lb (94.348 kg)    Body mass index is 27.45 kg/(m^2).   General: WDWN male in NAD GAIT: normal Eyes: PERRLA Pulmonary: Non-labored, decreased air movement in all posterior fields, no rales,  rhonchi, + transient wheezing right posterior fields.  Cardiac: regular rhythm, no detected murmur. Pacemaker palpated subcutaneously in left upper chest.  VASCULAR EXAM Carotid Bruits Left Right   Negative Negative   Aorta is not palpable. Radial pulses are 2+ palpable and equal.  Femoral pulses: right is 1+ palpable, left is 2+ palpable. Popliteal pulses are not palpable. DP pulses are faintly palpable, PT pulses are not palpable. No signs of ischemia in feet, no open wounds.   Gastrointestinal: soft, nontender, BS WNL, no r/g,no palpable masses.  Musculoskeletal: Negative muscle atrophy/wasting. M/S 5/5 throughout, Extremities without ischemic changes.  Neurologic: A&O X 3; Appropriate Affect, Speech is normal CN 2-12 intact, Pain and light touch intact in extremities, Motor exam as listed above.          Non-Invasive Vascular Imaging CAROTID DUPLEX 09/25/2014   CEREBROVASCULAR DUPLEX EVALUATION    INDICATION: Follow-up carotid disease     PREVIOUS INTERVENTION(S): Left carotid endarterectomy 07/18/1999    DUPLEX EXAM:     RIGHT  LEFT  Peak Systolic Velocities (cm/s) End Diastolic Velocities (cm/s) Plaque LOCATION Peak Systolic Velocities (cm/s) End Diastolic Velocities (cm/s) Plaque  77 0  CCA PROXIMAL 111 9   80 6  CCA MID 94 11   90 8  CCA DISTAL 92 11   321 3 HT ECA 126 0   133 22 HT ICA PROXIMAL 97 14   89 12  ICA MID 116  20   108 17  ICA DISTAL 105 24     1.5 ICA / CCA Ratio (PSV) NA  Antegrade  Vertebral Flow Bidirectional   440 Brachial Systolic Pressure (mmHg) 102  Within normal limits  Brachial Artery Waveforms Suggestive of proximal stenosis      Plaque Morphology:  HM = Homogeneous, HT = Heterogeneous, CP = Calcific Plaque, SP = Smooth Plaque, IP = Irregular Plaque  ADDITIONAL FINDINGS: Brachial blood pressure gradient is observed    IMPRESSION: 1. Evidence of minimal (1%-49%) stenosis of the right internal carotid artery. 2. Significant right external carotid artery stenosis is present. 3. Widely patent left carotid endarterectomy without evidence of restenosis or hyperplasia.  4. Right vertebral artery is antegrade; left is  bidirectional. 5. Right subclavian artery is within normal limits; left is monophasic.    Compared to the previous exam:  No significant change compared to prior exam.      Assessment: Daniel Woodard is a 81 y.o. male who is s/p  left carotid endarterectomy  in February of 2001. He has no history of stroke or TIA. Today's carotid Duplex suggests minimal (1%-49%) stenosis of the right internal carotid artery, widely patent left carotid endarterectomy without evidence of restenosis or hyperplasia, right vertebral artery is antegrade; left is bidirectional, right subclavian artery is within normal limits; left is monophasic. No significant change compared to prior Duplex on 09/19/13. He denies steal symptoms in either hand/arm, denies dizziness when reaching overhead.   Plan: Follow-up in 1 year with Carotid Duplex.   I discussed in depth with the patient the nature of atherosclerosis, and emphasized the importance of maximal medical management including strict control of blood pressure, blood glucose, and lipid levels, obtaining regular exercise, and continued cessation of smoking.  The patient is aware that without maximal medical management the underlying atherosclerotic  disease process will progress, limiting the benefit of any interventions. The patient was given information about stroke prevention and what symptoms should prompt the patient to seek immediate medical care. Thank you for allowing [place] to participate in this patient's care.  Clemon Chambers, RN, MSN, FNP-C [facility] Office: [phone]  Clinic Physician: Oneida Alar  09/25/2014 2:17 PM

## 2014-09-25 NOTE — Patient Instructions (Signed)
Stroke Prevention Some medical conditions and behaviors are associated with an increased chance of having a stroke. You may prevent a stroke by making healthy choices and managing medical conditions. HOW CAN I REDUCE MY RISK OF HAVING A STROKE?   Stay physically active. Get at least 30 minutes of activity on most or all days.  Do not smoke. It may also be helpful to avoid exposure to secondhand smoke.  Limit alcohol use. Moderate alcohol use is considered to be:  No more than 2 drinks per day for men.  No more than 1 drink per day for nonpregnant women.  Eat healthy foods. This involves:  Eating 5 or more servings of fruits and vegetables a day.  Making dietary changes that address high blood pressure (hypertension), high cholesterol, diabetes, or obesity.  Manage your cholesterol levels.  Making food choices that are high in fiber and low in saturated fat, trans fat, and cholesterol may control cholesterol levels.  Take any prescribed medicines to control cholesterol as directed by your health care provider.  Manage your diabetes.  Controlling your carbohydrate and sugar intake is recommended to manage diabetes.  Take any prescribed medicines to control diabetes as directed by your health care provider.  Control your hypertension.  Making food choices that are low in salt (sodium), saturated fat, trans fat, and cholesterol is recommended to manage hypertension.  Take any prescribed medicines to control hypertension as directed by your health care provider.  Maintain a healthy weight.  Reducing calorie intake and making food choices that are low in sodium, saturated fat, trans fat, and cholesterol are recommended to manage weight.  Stop drug abuse.  Avoid taking birth control pills.  Talk to your health care provider about the risks of taking birth control pills if you are over 35 years old, smoke, get migraines, or have ever had a blood clot.  Get evaluated for sleep  disorders (sleep apnea).  Talk to your health care provider about getting a sleep evaluation if you snore a lot or have excessive sleepiness.  Take medicines only as directed by your health care provider.  For some people, aspirin or blood thinners (anticoagulants) are helpful in reducing the risk of forming abnormal blood clots that can lead to stroke. If you have the irregular heart rhythm of atrial fibrillation, you should be on a blood thinner unless there is a good reason you cannot take them.  Understand all your medicine instructions.  Make sure that other conditions (such as anemia or atherosclerosis) are addressed. SEEK IMMEDIATE MEDICAL CARE IF:   You have sudden weakness or numbness of the face, arm, or leg, especially on one side of the body.  Your face or eyelid droops to one side.  You have sudden confusion.  You have trouble speaking (aphasia) or understanding.  You have sudden trouble seeing in one or both eyes.  You have sudden trouble walking.  You have dizziness.  You have a loss of balance or coordination.  You have a sudden, severe headache with no known cause.  You have new chest pain or an irregular heartbeat. Any of these symptoms may represent a serious problem that is an emergency. Do not wait to see if the symptoms will go away. Get medical help at once. Call your local emergency services (911 in [place] Do not drive yourself to the hospital. Document Released: 06/23/2004 Document Revised: 09/30/2013 Document Reviewed: 11/16/2012 Exit[employer] Patient Information 2015 [employer]. This information is not intended to replace advice given   to you by your health care provider. Make sure you discuss any questions you have with your health care provider.  

## 2014-10-07 ENCOUNTER — Ambulatory Visit (INDEPENDENT_AMBULATORY_CARE_PROVIDER_SITE_OTHER): Payer: Medicare Other | Admitting: *Deleted

## 2014-10-07 DIAGNOSIS — I441 Atrioventricular block, second degree: Secondary | ICD-10-CM | POA: Diagnosis not present

## 2014-10-07 NOTE — Progress Notes (Signed)
Remote pacemaker transmission.   

## 2014-10-10 LAB — CUP PACEART REMOTE DEVICE CHECK
Battery Voltage: 2.78 V
Brady Statistic AP VS Percent: 26 %
Brady Statistic AS VP Percent: 7 %
Brady Statistic AS VS Percent: 66 %
Lead Channel Impedance Value: 470 Ohm
Lead Channel Pacing Threshold Amplitude: 0.5 V
Lead Channel Pacing Threshold Amplitude: 0.875 V
Lead Channel Sensing Intrinsic Amplitude: 2.8 mV
Lead Channel Setting Pacing Amplitude: 2 V
Lead Channel Setting Sensing Sensitivity: 4 mV
MDC IDC MSMT BATTERY IMPEDANCE: 110 Ohm
MDC IDC MSMT BATTERY REMAINING LONGEVITY: 154 mo
MDC IDC MSMT LEADCHNL RA PACING THRESHOLD PULSEWIDTH: 0.4 ms
MDC IDC MSMT LEADCHNL RV IMPEDANCE VALUE: 586 Ohm
MDC IDC MSMT LEADCHNL RV PACING THRESHOLD PULSEWIDTH: 0.4 ms
MDC IDC SESS DTM: 20160510120943
MDC IDC SET LEADCHNL RV PACING AMPLITUDE: 2.5 V
MDC IDC SET LEADCHNL RV PACING PULSEWIDTH: 0.4 ms
MDC IDC STAT BRADY AP VP PERCENT: 1 %

## 2014-10-16 ENCOUNTER — Other Ambulatory Visit: Payer: Self-pay | Admitting: Internal Medicine

## 2014-10-17 ENCOUNTER — Encounter: Payer: Self-pay | Admitting: Cardiology

## 2014-10-21 ENCOUNTER — Encounter: Payer: Self-pay | Admitting: Internal Medicine

## 2014-11-13 ENCOUNTER — Encounter: Payer: Self-pay | Admitting: Internal Medicine

## 2014-11-13 ENCOUNTER — Other Ambulatory Visit (INDEPENDENT_AMBULATORY_CARE_PROVIDER_SITE_OTHER): Payer: Medicare Other

## 2014-11-13 DIAGNOSIS — E119 Type 2 diabetes mellitus without complications: Secondary | ICD-10-CM

## 2014-11-13 LAB — LIPID PANEL
CHOL/HDL RATIO: 5
Cholesterol: 159 mg/dL (ref 0–200)
HDL: 33.6 mg/dL — ABNORMAL LOW (ref >39.00)
LDL Cholesterol: 87 mg/dL (ref 0–99)
NonHDL: 125.4
TRIGLYCERIDES: 193 mg/dL — AB (ref 0.0–149.0)
VLDL: 38.6 mg/dL (ref 0.0–40.0)

## 2014-11-13 LAB — BASIC METABOLIC PANEL
BUN: 25 mg/dL — AB (ref 6–23)
CALCIUM: 9.1 mg/dL (ref 8.4–10.5)
CO2: 26 mEq/L (ref 19–32)
CREATININE: 1.39 mg/dL (ref 0.40–1.50)
Chloride: 103 mEq/L (ref 96–112)
GFR: 51.9 mL/min — ABNORMAL LOW (ref >60.00)
Glucose, Bld: 133 mg/dL — ABNORMAL HIGH (ref 70–99)
Potassium: 4.1 mEq/L (ref 3.5–5.1)
Sodium: 136 mEq/L (ref 135–145)

## 2014-11-13 LAB — HEPATIC FUNCTION PANEL
ALBUMIN: 4.2 g/dL (ref 3.5–5.2)
ALT: 18 U/L (ref 0–53)
AST: 18 U/L (ref 0–37)
Alkaline Phosphatase: 61 U/L (ref 39–117)
Bilirubin, Direct: 0.1 mg/dL (ref 0.0–0.3)
Total Bilirubin: 0.6 mg/dL (ref 0.2–1.2)
Total Protein: 7.5 g/dL (ref 6.0–8.3)

## 2014-11-13 LAB — HEMOGLOBIN A1C: HEMOGLOBIN A1C: 6.3 % (ref 4.6–6.5)

## 2014-11-20 ENCOUNTER — Ambulatory Visit (INDEPENDENT_AMBULATORY_CARE_PROVIDER_SITE_OTHER): Payer: Medicare Other | Admitting: Internal Medicine

## 2014-11-20 ENCOUNTER — Encounter: Payer: Self-pay | Admitting: Internal Medicine

## 2014-11-20 VITALS — BP 130/80 | HR 69 | Temp 97.5°F | Ht 73.0 in | Wt 206.2 lb

## 2014-11-20 DIAGNOSIS — Z0000 Encounter for general adult medical examination without abnormal findings: Secondary | ICD-10-CM

## 2014-11-20 DIAGNOSIS — E119 Type 2 diabetes mellitus without complications: Secondary | ICD-10-CM | POA: Diagnosis not present

## 2014-11-20 DIAGNOSIS — E785 Hyperlipidemia, unspecified: Secondary | ICD-10-CM | POA: Diagnosis not present

## 2014-11-20 DIAGNOSIS — Z0189 Encounter for other specified special examinations: Secondary | ICD-10-CM

## 2014-11-20 DIAGNOSIS — I739 Peripheral vascular disease, unspecified: Secondary | ICD-10-CM | POA: Diagnosis not present

## 2014-11-20 DIAGNOSIS — I10 Essential (primary) hypertension: Secondary | ICD-10-CM | POA: Diagnosis not present

## 2014-11-20 MED ORDER — AMLODIPINE BESYLATE 10 MG PO TABS: 10.0000 mg | ORAL_TABLET | Freq: Every day | ORAL | Status: DC

## 2014-11-20 MED ORDER — OMEPRAZOLE 20 MG PO CPDR: 20.0000 mg | DELAYED_RELEASE_CAPSULE | Freq: Every day | ORAL | Status: DC

## 2014-11-20 MED ORDER — LOSARTAN POTASSIUM-HCTZ 100-25 MG PO TABS: 1.0000 | ORAL_TABLET | Freq: Every day | ORAL | Status: DC

## 2014-11-20 MED ORDER — METOPROLOL SUCCINATE ER 25 MG PO TB24: 25.0000 mg | ORAL_TABLET | Freq: Every day | ORAL | Status: DC

## 2014-11-20 MED ORDER — ROSUVASTATIN CALCIUM 20 MG PO TABS: 20.0000 mg | ORAL_TABLET | Freq: Every day | ORAL | Status: DC

## 2014-11-20 NOTE — Assessment & Plan Note (Signed)
stable overall by history and exam, recent data reviewed with pt, and pt to continue medical treatment as before,  to f/u any worsening symptoms or concerns  

## 2014-11-20 NOTE — Progress Notes (Signed)
Subjective:    Patient ID: Daniel Woodard, male    DOB: March 13, 1930, 81 y.o.   MRN: [id number]  HPI  Here to f/u; overall doing ok,  Pt denies chest pain, increasing sob or doe, wheezing, orthopnea, PND, increased LE swelling, palpitations, dizziness or syncope.  Pt denies new neurological symptoms such as new headache, or facial or extremity weakness or numbness.  Pt denies polydipsia, polyuria, or low sugar episode.   Pt denies new neurological symptoms such as new headache, or facial or extremity weakness or numbness.   Pt states overall good compliance with meds, mostly trying to follow appropriate diet, with wt overall stable,  but little exercise however.  No current compalints Recent carotid u/s April 2016 follow up no change.  Only taking statin 2 out of every 3 days due to recurring hand cramps now improved Past Medical History  Diagnosis Date  . ALLERGIC RHINITIS 04/09/2007  . BACTEREMIA, 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  . BENIGN PROSTATIC HYPERTROPHY 04/09/2007  . BRADYCARDIA 05/11/2009  . COLONIC POLYPS, HX OF 04/09/2007    ADENOMATOUS POLYPS [id number]  . COPD 04/09/2007  . Cough 03/27/2008  . DEPRESSION 04/09/2007  . DIABETES MELLITUS, TYPE II 10/09/2007  . DIVERTICULOSIS, COLON 04/09/2007  . DIZZINESS 10/06/2008  . GLUCOSE INTOLERANCE 04/09/2007  . HYPERLIPIDEMIA 03/27/2007  . HYPERTENSION 03/27/2007  . PERIPHERAL VASCULAR DISEASE 03/27/2007  . PNEUMONIA ORGANISM NOS 11/08/2007  . SHINGLES, HX OF 04/09/2007  . SHOULDER PAIN, BILATERAL 08/01/2007  . Blood transfusion without reported diagnosis     really not sure thinks 30-40 years ago  . Ulcer     40 years ago  . Complete heart block   . Atrial fibrillation   . Carotid artery occlusion    Past Surgical History  Procedure Laterality Date  . Carotid endarterectomy  5/08    left  . S/p pud surgury      ? partial gastrectomy  . Carpal tunnel release      bilateral  . Esophagogastroduodenoscopy  06/07/2004  .  Colonoscopy    . Hemorrhoid banding  1990s  . Pacemaker insertion  07/11/2013    MDT ADDRL1 pacemaker implanted by Dr Lovena Le for complete heart block  . Appendectomy      81 years old  . Permanent pacemaker insertion N/A 07/11/2013    Procedure: PERMANENT PACEMAKER INSERTION;  Surgeon: Evans Lance, MD;  Location: [facility] CATH LAB;  Service: Cardiovascular;  Laterality: N/A;    reports that he quit smoking about 15 years ago. His smoking use included Cigarettes. He has a 50 pack-year smoking history. He has never used smokeless tobacco. He reports that he does not drink alcohol or use illicit drugs. family history includes Heart disease in his other and sister; Hypertension in his father, mother, sister, sister, sister, and sister; Lung cancer in his sister; Peripheral vascular disease in his brother; Stroke in his other; Ulcers in his father and sister. There is no history of Colon cancer. Allergies  Allergen Reactions  . Ace Inhibitors     REACTION: cough   Current Outpatient Prescriptions on File Prior to Visit  Medication Sig Dispense Refill  . aspirin 325 MG tablet Take 325 mg by mouth daily.      . Coenzyme Q10 (COQ-10) 200 MG CAPS Take 200 mg by mouth daily.     . psyllium (METAMUCIL) 58.6 % powder 2 caps every day      No current facility-administered medications on file prior to visit.  Review of Systems  Constitutional: Negative for unusual diaphoresis or night sweats HENT: Negative for ringing in ear or discharge Eyes: Negative for double vision or worsening visual disturbance.  Respiratory: Negative for choking and stridor.   Gastrointestinal: Negative for vomiting or other signifcant bowel change Genitourinary: Negative for hematuria or change in urine volume.  Musculoskeletal: Negative for other MSK pain or swelling Skin: Negative for color change and worsening wound.  Neurological: Negative for tremors and numbness other than noted  Psychiatric/Behavioral: Negative for  decreased concentration or agitation other than above       Objective:   Physical Exam BP 130/80 mmHg  Pulse 69  Temp(Src) 97.5 F (36.4 C) (Oral)  Ht 6\' 1"  (1.854 m)  Wt 206 lb 4 oz (93.554 kg)  BMI 27.22 kg/m2  SpO2 98% VS noted,  Constitutional: Pt appears in no significant distress HENT: Head: NCAT.  Right Ear: External ear normal.  Left Ear: External ear normal.  Eyes: . Pupils are equal, round, and reactive to light. Conjunctivae and EOM are normal Neck: Normal range of motion. Neck supple.  Cardiovascular: Normal rate and regular rhythm.   Pulmonary/Chest: Effort normal and breath sounds without rales or wheezing.  Abd:  Soft, NT, ND, + BS Neurological: Pt is alert. Not confused , motor grossly intact Skin: Skin is warm. No rash, no LE edema Psychiatric: Pt behavior is normal. No agitation.     Assessment & Plan:

## 2014-11-20 NOTE — Assessment & Plan Note (Signed)
stable overall by history and exam, recent data reviewed with pt, and pt to continue medical treatment as before,  to f/u any worsening symptoms or concerns Lab Results  Component Value Date   HGBA1C 6.3 11/13/2014

## 2014-11-20 NOTE — Assessment & Plan Note (Signed)
stable overall by history and exam, recent data reviewed with pt, and pt to continue medical treatment as before,  to f/u any worsening symptoms or concerns BP Readings from Last 3 Encounters:  11/20/14 130/80  09/25/14 147/73  08/11/14 90/60

## 2014-11-20 NOTE — Assessment & Plan Note (Addendum)
Taking statin 2 out of every 3 days diligently, o/w stable overall by history and exam, recent data reviewed with pt, and pt to continue medical treatment as before,  to f/u any worsening symptoms or concerns  Lab Results  Component Value Date   LDLCALC 87 11/13/2014

## 2014-11-20 NOTE — Patient Instructions (Signed)
Please continue all other medications as before, and refills have been done if requested.  Please have the pharmacy call with any other refills you may need.  Please continue your efforts at being more active, low cholesterol diet, and weight control.  You are otherwise up to date with prevention measures today.  Please keep your appointments with your specialists as you may have planned  Please return in 6 months, or sooner if needed, with Lab testing done 3-5 days before  

## 2014-11-20 NOTE — Progress Notes (Signed)
Pre visit review using our clinic review tool, if applicable. No additional management support is needed unless otherwise documented below in the visit note. 

## 2014-11-28 ENCOUNTER — Encounter: Payer: Self-pay | Admitting: Internal Medicine

## 2014-11-28 ENCOUNTER — Ambulatory Visit (INDEPENDENT_AMBULATORY_CARE_PROVIDER_SITE_OTHER): Payer: Medicare Other | Admitting: Internal Medicine

## 2014-11-28 VITALS — BP 102/64 | HR 68 | Ht 72.0 in | Wt 211.0 lb

## 2014-11-28 DIAGNOSIS — J449 Chronic obstructive pulmonary disease, unspecified: Secondary | ICD-10-CM

## 2014-11-28 LAB — PULMONARY FUNCTION TEST
DL/VA % PRED: 69 %
DL/VA: 3.25 ml/min/mmHg/L
DLCO UNC: 16.48 ml/min/mmHg
DLCO unc % pred: 47 %
FEF 25-75 Post: 1.29 L/sec
FEF 25-75 Pre: 1.21 L/sec
FEF2575-%Change-Post: 6 %
FEF2575-%PRED-POST: 63 %
FEF2575-%PRED-PRE: 59 %
FEV1-%Change-Post: 0 %
FEV1-%PRED-POST: 66 %
FEV1-%PRED-PRE: 66 %
FEV1-PRE: 2 L
FEV1-Post: 2 L
FEV1FVC-%Change-Post: 0 %
FEV1FVC-%Pred-Pre: 89 %
FEV6-%Change-Post: 1 %
FEV6-%PRED-PRE: 77 %
FEV6-%Pred-Post: 78 %
FEV6-POST: 3.11 L
FEV6-PRE: 3.08 L
FEV6FVC-%Change-Post: 0 %
FEV6FVC-%PRED-PRE: 104 %
FEV6FVC-%Pred-Post: 105 %
FVC-%Change-Post: 0 %
FVC-%Pred-Post: 74 %
FVC-%Pred-Pre: 74 %
FVC-POST: 3.16 L
FVC-PRE: 3.16 L
Post FEV1/FVC ratio: 63 %
Post FEV6/FVC ratio: 98 %
Pre FEV1/FVC ratio: 63 %
Pre FEV6/FVC Ratio: 98 %
RV % pred: 123 %
RV: 3.48 L
TLC % pred: 92 %
TLC: 6.93 L

## 2014-11-28 NOTE — Patient Instructions (Signed)
Pulmonary follow up is as needed        

## 2014-11-28 NOTE — Progress Notes (Signed)
PFT done today. 

## 2014-11-28 NOTE — Progress Notes (Signed)
Subjective:    Patient ID: Daniel Woodard, male    DOB: 05/30/1930    MRN: [id number]    Brief patient profile:  41 yowm quit smoking 2001 self referred 08/11/14 to pulmonary clinic for lung ca screening with GOLD II copd documented 11/28/2014    History of Present Illness  08/11/2014 1st [facility] office visit/ Daniel Woodard   Chief Complaint  Patient presents with  . Pulmonary Consult    Pt last seen here in June 2009.He states he has had 2 sisters develop lung CA and wants a screening for this. He states that he gets SOB occ, but is not limited by his breathing.   doe x inclines no steps at home but does ok slow and flat x malls ok  Cough x worse in am and winter > scant white mucus never bloody couple  time a day no more than a tsp Not using any inhalers  rec Please remember to go to the x-ray department downstairs for your tests - we will call you with the results when they are available.    11/28/2014 f/u ov/Daniel Woodard re: GOLD II copd no meds  Chief Complaint  Patient presents with  . Follow-up    PFT done today.  Pt states his breathing is doing well and denies any new co's today.     Not limited by breathing from desired activities  / quite active for his age but no aerobic level ex / still sob steps and steep inclines = MMRC1   No obvious day to day or daytime variabilty or assoc chronic cough or cp or chest tightness, subjective wheeze overt sinus or hb symptoms. No unusual exp hx or h/o childhood pna/ asthma or knowledge of premature birth.  Sleeping ok without nocturnal  or early am exacerbation  of respiratory  c/o's or need for noct saba. Also denies any obvious fluctuation of symptoms with weather or environmental changes or other aggravating or alleviating factors except as outlined above   Current Medications, Allergies, Complete Past Medical History, Past Surgical History, Family History, and Social History were reviewed in [employer] record.  ROS   The following are not active complaints unless bolded sore throat, dysphagia, dental problems, itching, sneezing,  nasal congestion or excess/ purulent secretions, ear ache,   fever, chills, sweats, unintended wt loss, pleuritic or exertional cp, hemoptysis,  orthopnea pnd or leg swelling, presyncope, palpitations, abdominal pain, anorexia, nausea, vomiting, diarrhea  or change in bowel or urinary habits, change in stools or urine, dysuria,hematuria,  rash, arthralgias, visual complaints, headache, numbness weakness or ataxia or problems with walking or coordination,  change in mood/affect or memory.          Objective:   Physical Exam   11/28/2014          211 Wt Readings from Last 3 Encounters:  08/11/14 205 lb (92.987 kg)  07/08/14 208 lb (94.348 kg)  05/21/14 208 lb 4 oz (94.462 kg)    Vital signs reviewed   Pleasant amb wm nad/ all smiles  HEENT: full dentures/ nl  turbinates, and orophanx. Nl external ear canals without cough reflex   NECK :  without JVD/Nodes/TM/ nl carotid upstrokes bilaterally   LUNGS: no acc muscle use, distant bs s wheeze/ slt barrel chest    CV:  RRR  no s3 or murmur or increase in P2, no edema   ABD:  soft and nontender with Pos hoover late insp. No bruits or organomegaly,  bowel sounds nl  MS:  warm without deformities, calf tenderness, cyanosis or clubbing  SKIN: warm and dry without lesions    NEURO:  alert, approp, no deficits      CXR PA and Lateral:   08/11/2014 :     I personally reviewed images and agree with radiology impression as follows:    COPD. No active disease.      Assessment & Plan:

## 2014-11-29 ENCOUNTER — Encounter: Payer: Self-pay | Admitting: Internal Medicine

## 2014-11-29 NOTE — Assessment & Plan Note (Addendum)
-   quit smoking 2001 - PFTs 11/28/14  FEV 2.00 (66%) ratio 63 and no change p saba, dlco 47 corrects to 69 for alv vol  So this is moderate dz with emphysematous "phenotype"   I had an extended final summary discussion with the patient reviewing all relevant studies completed to date and  lasting 15 to 20 minutes of a 25 minute visit on the following issues:    1) I  reviewed the Fletcher curve with the patient that basically indicates  if you quit smoking when your best day FEV1 is still well preserved (as is clearly  the case here)  it is highly unlikely you will progress to severe disease and informed the patient there was no medication on the market that has proven to alter the curve/ its downward trajectory  or the likelihood of progression of their disease.  Therefore stopping smoking and maintaining abstinence is the most important aspect of care, not choice of inhalers or for that matter, doctors.    2) in the absence of limiting symptoms or tendency to exacerbation I don't rec any rx/ but if he does feel limited he would be a good candidate for a LAMA like spiriva, tudorza or incruse depending on insurance preference.   3) Each maintenance medication was reviewed in detail including most importantly the difference between maintenance and as needed and under what circumstances the prns are to be used.  Please see instructions for details which were reviewed in writing and the patient given a copy.

## 2015-01-08 ENCOUNTER — Ambulatory Visit (INDEPENDENT_AMBULATORY_CARE_PROVIDER_SITE_OTHER): Payer: Medicare Other | Admitting: *Deleted

## 2015-01-08 DIAGNOSIS — I441 Atrioventricular block, second degree: Secondary | ICD-10-CM | POA: Diagnosis not present

## 2015-01-08 NOTE — Progress Notes (Signed)
Remote pacemaker transmission.   

## 2015-01-14 LAB — CUP PACEART REMOTE DEVICE CHECK
Brady Statistic AP VS Percent: 16 %
Brady Statistic AS VP Percent: 35 %
Brady Statistic AS VS Percent: 37 %
Lead Channel Impedance Value: 588 Ohm
Lead Channel Pacing Threshold Amplitude: 0.625 V
Lead Channel Pacing Threshold Amplitude: 0.75 V
Lead Channel Pacing Threshold Pulse Width: 0.4 ms
Lead Channel Pacing Threshold Pulse Width: 0.4 ms
Lead Channel Setting Pacing Amplitude: 2 V
Lead Channel Setting Sensing Sensitivity: 4 mV
MDC IDC MSMT BATTERY IMPEDANCE: 110 Ohm
MDC IDC MSMT BATTERY REMAINING LONGEVITY: 145 mo
MDC IDC MSMT BATTERY VOLTAGE: 2.77 V
MDC IDC MSMT LEADCHNL RA IMPEDANCE VALUE: 457 Ohm
MDC IDC MSMT LEADCHNL RA SENSING INTR AMPL: 2.8 mV
MDC IDC SESS DTM: 20160811131001
MDC IDC SET LEADCHNL RV PACING AMPLITUDE: 2.5 V
MDC IDC SET LEADCHNL RV PACING PULSEWIDTH: 0.4 ms
MDC IDC STAT BRADY AP VP PERCENT: 12 %

## 2015-01-29 ENCOUNTER — Encounter: Payer: Self-pay | Admitting: Cardiology

## 2015-02-03 ENCOUNTER — Encounter: Payer: Self-pay | Admitting: Internal Medicine

## 2015-02-03 ENCOUNTER — Ambulatory Visit (INDEPENDENT_AMBULATORY_CARE_PROVIDER_SITE_OTHER): Payer: Medicare Other | Admitting: Internal Medicine

## 2015-02-03 VITALS — BP 108/64 | HR 60 | Temp 97.6°F | Ht 73.0 in | Wt 206.0 lb

## 2015-02-03 DIAGNOSIS — M7021 Olecranon bursitis, right elbow: Secondary | ICD-10-CM | POA: Diagnosis not present

## 2015-02-03 DIAGNOSIS — Z23 Encounter for immunization: Secondary | ICD-10-CM

## 2015-02-03 DIAGNOSIS — E119 Type 2 diabetes mellitus without complications: Secondary | ICD-10-CM | POA: Diagnosis not present

## 2015-02-03 DIAGNOSIS — I10 Essential (primary) hypertension: Secondary | ICD-10-CM

## 2015-02-03 NOTE — Progress Notes (Signed)
Pre visit review using our clinic review tool, if applicable. No additional management support is needed unless otherwise documented below in the visit note. 

## 2015-02-03 NOTE — Patient Instructions (Signed)
You had the flu shot today  Please use a pillow for elbow rest, or a wrestler's elbow pad for the left elbow to allow healing over the next few weeks  Please continue all other medications as before, and refills have been done if requested.  Please have the pharmacy call with any other refills you may need.  Please keep your appointments with your specialists as you may have planned

## 2015-02-03 NOTE — Progress Notes (Signed)
Subjective:    Patient ID: Daniel Woodard, male    DOB: Oct 17, 1929, 81 y.o.   MRN: [id number]  HPI  Here with 1 wk onset painless swelling to tip of right elbow, mild to mod, constant, without assoc pain or other symptoms, has FROM of elbow, no hx of gout, no recent trauma.  Does sit with right elbow on arm of chair several hours per day  No fever., Pt denies chest pain, increased sob or doe, wheezing, orthopnea, PND, increased LE swelling, palpitations, dizziness or syncope.   Pt denies polydipsia, polyuria,   Past Medical History  Diagnosis Date  . ALLERGIC RHINITIS 04/09/2007  . BACTEREMIA, 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  . BENIGN PROSTATIC HYPERTROPHY 04/09/2007  . BRADYCARDIA 05/11/2009  . COLONIC POLYPS, HX OF 04/09/2007    ADENOMATOUS POLYPS 0277,4128  . COPD 04/09/2007  . Cough 03/27/2008  . DEPRESSION 04/09/2007  . DIABETES MELLITUS, TYPE II 10/09/2007  . DIVERTICULOSIS, COLON 04/09/2007  . DIZZINESS 10/06/2008  . GLUCOSE INTOLERANCE 04/09/2007  . HYPERLIPIDEMIA 03/27/2007  . HYPERTENSION 03/27/2007  . PERIPHERAL VASCULAR DISEASE 03/27/2007  . PNEUMONIA ORGANISM NOS 11/08/2007  . SHINGLES, HX OF 04/09/2007  . SHOULDER PAIN, BILATERAL 08/01/2007  . Blood transfusion without reported diagnosis     really not sure thinks 30-40 years ago  . Ulcer     40 years ago  . Complete heart block   . Atrial fibrillation   . Carotid artery occlusion    Past Surgical History  Procedure Laterality Date  . Carotid endarterectomy  5/08    left  . S/p pud surgury      ? partial gastrectomy  . Carpal tunnel release      bilateral  . Esophagogastroduodenoscopy  06/07/2004  . Colonoscopy    . Hemorrhoid banding  1990s  . Pacemaker insertion  07/11/2013    MDT ADDRL1 pacemaker implanted by Dr Lovena Le for complete heart block  . Appendectomy      81 years old  . Permanent pacemaker insertion N/A 07/11/2013    Procedure: PERMANENT PACEMAKER INSERTION;  Surgeon: Evans Lance, MD;   Location: [facility] CATH LAB;  Service: Cardiovascular;  Laterality: N/A;    reports that he quit smoking about 15 years ago. His smoking use included Cigarettes. He has a 50 pack-year smoking history. He has never used smokeless tobacco. He reports that he does not drink alcohol or use illicit drugs. family history includes Heart disease in his other and sister; Hypertension in his father, mother, sister, sister, sister, and sister; Lung cancer in his sister; Peripheral vascular disease in his brother; Stroke in his other; Ulcers in his father and sister. There is no history of Colon cancer. Allergies  Allergen Reactions  . Ace Inhibitors     REACTION: cough   Current Outpatient Prescriptions on File Prior to Visit  Medication Sig Dispense Refill  . amLODipine (NORVASC) 10 MG tablet Take 1 tablet (10 mg total) by mouth daily. 90 tablet 3  . aspirin 325 MG tablet Take 325 mg by mouth daily.      . Coenzyme Q10 (COQ-10) 200 MG CAPS Take 200 mg by mouth daily.     Marland Kitchen losartan-hydrochlorothiazide (HYZAAR) 100-25 MG per tablet Take 1 tablet by mouth daily. 90 tablet 3  . metoprolol succinate (TOPROL-XL) 25 MG 24 hr tablet Take 1 tablet (25 mg total) by mouth daily. 90 tablet 3  . omeprazole (PRILOSEC) 20 MG capsule Take 1 capsule (20 mg total) by mouth daily.  90 capsule 3  . psyllium (METAMUCIL) 58.6 % powder 2 caps every day     . rosuvastatin (CRESTOR) 20 MG tablet Take 1 tablet (20 mg total) by mouth at bedtime. 90 tablet 3   No current facility-administered medications on file prior to visit.   Review of Systems  Constitutional: Negative for unusual diaphoresis or night sweats HENT: Negative for ringing in ear or discharge Eyes: Negative for double vision or worsening visual disturbance.  Respiratory: Negative for choking and stridor.   Gastrointestinal: Negative for vomiting or other signifcant bowel change Genitourinary: Negative for hematuria or change in urine volume.  Musculoskeletal:  Negative for other MSK pain or swelling Skin: Negative for color change and worsening wound.  Neurological: Negative for tremors and numbness other than noted  Psychiatric/Behavioral: Negative for decreased concentration or agitation other than above       Objective:   Physical Exam BP 108/64 mmHg  Pulse 60  Temp(Src) 97.6 F (36.4 C) (Oral)  Ht 6\' 1"  (1.854 m)  Wt 206 lb (93.441 kg)  BMI 27.18 kg/m2  SpO2 95% VS noted,  Constitutional: Pt appears in no significant distress HENT: Head: NCAT.  Right Ear: External ear normal.  Left Ear: External ear normal.  Eyes: . Pupils are equal, round, and reactive to light. Conjunctivae and EOM are normal Neck: Normal range of motion. Neck supple.  Cardiovascular: Normal rate and regular rhythm.   Pulmonary/Chest: Effort normal and breath sounds without rales or wheezing. Right elbow with 2-3+ swelling nontneder nonerythem egg like shape to right olecranon, with RUE o/w neurovasc intact Neurological: Pt is alert. Not confused , motor grossly intact Skin: Skin is warm. No rash, no LE edema Psychiatric: Pt behavior is normal. No agitation.     Assessment & Plan:

## 2015-02-03 NOTE — Assessment & Plan Note (Signed)
stable overall by history and exam, recent data reviewed with pt, and pt to continue medical treatment as before,  to f/u any worsening symptoms or concerns BP Readings from Last 3 Encounters:  02/03/15 108/64  11/28/14 102/64  11/20/14 130/80

## 2015-02-03 NOTE — Assessment & Plan Note (Signed)
Mild to mod enlarged, due to too much pressure on elbow at home for too long, for pillow to take pressure off elbow, no other specific tx needed at this time

## 2015-02-03 NOTE — Assessment & Plan Note (Signed)
stable overall by history and exam, recent data reviewed with pt, and pt to continue medical treatment as before,  to f/u any worsening symptoms or concerns Lab Results  Component Value Date   HGBA1C 6.3 11/13/2014    

## 2015-02-09 ENCOUNTER — Encounter: Payer: Self-pay | Admitting: Internal Medicine

## 2015-04-09 ENCOUNTER — Ambulatory Visit (INDEPENDENT_AMBULATORY_CARE_PROVIDER_SITE_OTHER): Payer: Medicare Other | Admitting: *Deleted

## 2015-04-09 DIAGNOSIS — I441 Atrioventricular block, second degree: Secondary | ICD-10-CM | POA: Diagnosis not present

## 2015-04-09 NOTE — Progress Notes (Signed)
Remote pacemaker transmission.   

## 2015-04-10 ENCOUNTER — Encounter: Payer: Self-pay | Admitting: Cardiology

## 2015-04-10 LAB — CUP PACEART REMOTE DEVICE CHECK
Battery Impedance: 110 Ohm
Battery Voltage: 2.77 V
Brady Statistic AP VP Percent: 16 %
Brady Statistic AP VS Percent: 11 %
Brady Statistic AS VP Percent: 48 %
Implantable Lead Implant Date: 20150212
Implantable Lead Location: 753859
Implantable Lead Model: 5076
Implantable Lead Model: 5076
Lead Channel Impedance Value: 463 Ohm
Lead Channel Pacing Threshold Amplitude: 0.75 V
Lead Channel Pacing Threshold Pulse Width: 0.4 ms
Lead Channel Pacing Threshold Pulse Width: 0.4 ms
Lead Channel Sensing Intrinsic Amplitude: 2.8 mV
Lead Channel Setting Pacing Amplitude: 2 V
Lead Channel Setting Pacing Amplitude: 2.5 V
Lead Channel Setting Sensing Sensitivity: 4 mV
MDC IDC LEAD IMPLANT DT: 20150212
MDC IDC LEAD LOCATION: 753860
MDC IDC MSMT BATTERY REMAINING LONGEVITY: 141 mo
MDC IDC MSMT LEADCHNL RV IMPEDANCE VALUE: 575 Ohm
MDC IDC MSMT LEADCHNL RV PACING THRESHOLD AMPLITUDE: 0.75 V
MDC IDC SESS DTM: 20161110153650
MDC IDC SET LEADCHNL RV PACING PULSEWIDTH: 0.4 ms
MDC IDC STAT BRADY AS VS PERCENT: 25 %

## 2015-04-20 ENCOUNTER — Encounter: Payer: Self-pay | Admitting: Family

## 2015-04-20 ENCOUNTER — Ambulatory Visit (INDEPENDENT_AMBULATORY_CARE_PROVIDER_SITE_OTHER): Payer: Medicare Other | Admitting: Family

## 2015-04-20 ENCOUNTER — Other Ambulatory Visit (INDEPENDENT_AMBULATORY_CARE_PROVIDER_SITE_OTHER): Payer: Medicare Other

## 2015-04-20 VITALS — BP 102/82 | HR 93 | Temp 97.9°F | Resp 20 | Ht 73.0 in | Wt 201.8 lb

## 2015-04-20 DIAGNOSIS — J069 Acute upper respiratory infection, unspecified: Secondary | ICD-10-CM | POA: Diagnosis not present

## 2015-04-20 DIAGNOSIS — Z0000 Encounter for general adult medical examination without abnormal findings: Secondary | ICD-10-CM

## 2015-04-20 DIAGNOSIS — E119 Type 2 diabetes mellitus without complications: Secondary | ICD-10-CM | POA: Diagnosis not present

## 2015-04-20 DIAGNOSIS — Z0189 Encounter for other specified special examinations: Secondary | ICD-10-CM | POA: Diagnosis not present

## 2015-04-20 LAB — BASIC METABOLIC PANEL
BUN: 21 mg/dL (ref 6–23)
CHLORIDE: 99 meq/L (ref 96–112)
CO2: 24 meq/L (ref 19–32)
Calcium: 9.5 mg/dL (ref 8.4–10.5)
Creatinine, Ser: 1.41 mg/dL (ref 0.40–1.50)
GFR: 51 mL/min — ABNORMAL LOW (ref >60.00)
Glucose, Bld: 147 mg/dL — ABNORMAL HIGH (ref 70–99)
POTASSIUM: 4.3 meq/L (ref 3.5–5.1)
Sodium: 135 mEq/L (ref 135–145)

## 2015-04-20 LAB — MICROALBUMIN / CREATININE URINE RATIO
CREATININE, U: 355 mg/dL
MICROALB/CREAT RATIO: 1.8 mg/g (ref 0.0–30.0)
Microalb, Ur: 6.4 mg/dL — ABNORMAL HIGH (ref 0.0–1.9)

## 2015-04-20 LAB — LIPID PANEL
CHOL/HDL RATIO: 4
CHOLESTEROL: 151 mg/dL (ref 0–200)
HDL: 34.1 mg/dL — ABNORMAL LOW (ref >39.00)
LDL CALC: 93 mg/dL (ref 0–99)
NonHDL: 117.39
Triglycerides: 122 mg/dL (ref 0.0–149.0)
VLDL: 24.4 mg/dL (ref 0.0–40.0)

## 2015-04-20 LAB — URINALYSIS, ROUTINE W REFLEX MICROSCOPIC
KETONES UR: NEGATIVE
LEUKOCYTES UA: NEGATIVE
NITRITE: NEGATIVE
RBC / HPF: NONE SEEN (ref 0–?)
Specific Gravity, Urine: >=1.03 — AB (ref 1.000–1.030)
TOTAL PROTEIN, URINE-UPE24: NEGATIVE
URINE GLUCOSE: NEGATIVE
UROBILINOGEN UA: 0.2 (ref 0.0–1.0)
pH: 5 (ref 5.0–8.0)

## 2015-04-20 LAB — HEPATIC FUNCTION PANEL
ALT: 19 U/L (ref 0–53)
AST: 19 U/L (ref 0–37)
Albumin: 4.4 g/dL (ref 3.5–5.2)
Alkaline Phosphatase: 98 U/L (ref 39–117)
BILIRUBIN TOTAL: 1 mg/dL (ref 0.2–1.2)
Bilirubin, Direct: 0.3 mg/dL (ref 0.0–0.3)
TOTAL PROTEIN: 8.1 g/dL (ref 6.0–8.3)

## 2015-04-20 LAB — HEMOGLOBIN A1C: Hgb A1c MFr Bld: 6.6 % — ABNORMAL HIGH (ref 4.6–6.5)

## 2015-04-20 LAB — TSH: TSH: 2.24 u[IU]/mL (ref 0.35–4.50)

## 2015-04-20 MED ORDER — AZITHROMYCIN 250 MG PO TABS: ORAL_TABLET | ORAL | Status: DC

## 2015-04-20 MED ORDER — FLUTICASONE PROPIONATE 50 MCG/ACT NA SUSP: 2.0000 | Freq: Every day | NASAL | Status: DC

## 2015-04-20 NOTE — Patient Instructions (Signed)
Thank you for choosing [facility].  Summary/Instructions:  Your prescription(s) have been submitted to your pharmacy or been printed and provided for you. Please take as directed and contact our office if you believe you are having problem(s) with the medication(s) or have any questions.  If your symptoms worsen or fail to improve, please contact our office for further instruction, or in case of emergency go directly to the emergency room at the closest medical facility.    General Recommendations:    Please drink plenty of fluids.  Get plenty of rest   Sleep in humidified air  Use saline nasal sprays  Netti pot   OTC Medications:  Decongestants - helps relieve congestion   Flonase (generic fluticasone) or Nasacort (generic triamcinolone) - please make sure to use the "cross-over" technique at a 45 degree angle towards the opposite eye as opposed to straight up the nasal passageway.   Sudafed (generic pseudoephedrine - Note this is the one that is available behind the pharmacy counter); Products with phenylephrine (-PE) may also be used but is often not as effective as pseudoephedrine.   If you have HIGH BLOOD PRESSURE - Coricidin HBP; AVOID any product that is -D as this contains pseudoephedrine which may increase your blood pressure.  Afrin (oxymetazoline) every 6-8 hours for up to 3 days.   Allergies - helps relieve runny nose, itchy eyes and sneezing   Claritin (generic loratidine), Allegra (fexofenidine), or Zyrtec (generic cyrterizine) for runny nose. These medications should not cause drowsiness.  Note - Benadryl (generic diphenhydramine) may be used however may cause drowsiness  Cough -   Delsym or Robitussin (generic dextromethorphan)  Expectorants - helps loosen mucus to ease removal   Mucinex (generic guaifenesin) as directed on the package.  Headaches / General Aches   Tylenol (generic acetaminophen) - DO NOT EXCEED 3 grams (3,000 mg) in a 24  hour time period  Advil/Motrin (generic ibuprofen)   Sore Throat -   Salt water gargle   Chloraseptic (generic benzocaine) spray or lozenges / Sucrets (generic dyclonine)    Upper Respiratory Infection, Adult Most upper respiratory infections (URIs) are a viral infection of the air passages leading to the lungs. A URI affects the nose, throat, and upper air passages. The most common type of URI is nasopharyngitis and is typically referred to as "the common cold." URIs run their course and usually go away on their own. Most of the time, a URI does not require medical attention, but sometimes a bacterial infection in the upper airways can follow a viral infection. This is called a secondary infection. Sinus and middle ear infections are common types of secondary upper respiratory infections. Bacterial pneumonia can also complicate a URI. A URI can worsen asthma and chronic obstructive pulmonary disease (COPD). Sometimes, these complications can require emergency medical care and may be life threatening.  CAUSES Almost all URIs are caused by viruses. A virus is a type of germ and can spread from one person to another.  RISKS FACTORS You may be at risk for a URI if:   You smoke.   You have chronic heart or lung disease.  You have a weakened defense (immune) system.   You are very young or very old.   You have nasal allergies or asthma.  You work in crowded or poorly ventilated areas.  You work in health care facilities or schools. SIGNS AND SYMPTOMS  Symptoms typically develop 2-3 days after you come in contact with a cold virus. Most   viral URIs last 7-10 days. However, viral URIs from the influenza virus (flu virus) can last 14-18 days and are typically more severe. Symptoms may include:   Runny or stuffy (congested) nose.   Sneezing.   Cough.   Sore throat.   Headache.   Fatigue.   Fever.   Loss of appetite.   Pain in your forehead, behind your eyes, and  over your cheekbones (sinus pain).  Muscle aches.  DIAGNOSIS  Your health care provider may diagnose a URI by:  Physical exam.  Tests to check that your symptoms are not due to another condition such as:  Strep throat.  Sinusitis.  Pneumonia.  Asthma. TREATMENT  A URI goes away on its own with time. It cannot be cured with medicines, but medicines may be prescribed or recommended to relieve symptoms. Medicines may help:  Reduce your fever.  Reduce your cough.  Relieve nasal congestion. HOME CARE INSTRUCTIONS   Take medicines only as directed by your health care provider.   Gargle warm saltwater or take cough drops to comfort your throat as directed by your health care provider.  Use a warm mist humidifier or inhale steam from a shower to increase air moisture. This may make it easier to breathe.  Drink enough fluid to keep your urine clear or pale yellow.   Eat soups and other clear broths and maintain good nutrition.   Rest as needed.   Return to work when your temperature has returned to normal or as your health care provider advises. You may need to stay home longer to avoid infecting others. You can also use a face mask and careful hand washing to prevent spread of the virus.  Increase the usage of your inhaler if you have asthma.   Do not use any tobacco products, including cigarettes, chewing tobacco, or electronic cigarettes. If you need help quitting, ask your health care provider. PREVENTION  The best way to protect yourself from getting a cold is to practice good hygiene.   Avoid oral or hand contact with people with cold symptoms.   Wash your hands often if contact occurs.  There is no clear evidence that vitamin C, vitamin E, echinacea, or exercise reduces the chance of developing a cold. However, it is always recommended to get plenty of rest, exercise, and practice good nutrition.  SEEK MEDICAL CARE IF:   You are getting worse rather than  better.   Your symptoms are not controlled by medicine.   You have chills.  You have worsening shortness of breath.  You have brown or red mucus.  You have yellow or brown nasal discharge.  You have pain in your face, especially when you bend forward.  You have a fever.  You have swollen neck glands.  You have pain while swallowing.  You have white areas in the back of your throat. SEEK IMMEDIATE MEDICAL CARE IF:   You have severe or persistent:  Headache.  Ear pain.  Sinus pain.  Chest pain.  You have chronic lung disease and any of the following:  Wheezing.  Prolonged cough.  Coughing up blood.  A change in your usual mucus.  You have a stiff neck.  You have changes in your:  Vision.  Hearing.  Thinking.  Mood. MAKE SURE YOU:   Understand these instructions.  Will watch your condition.  Will get help right away if you are not doing well or get worse.   This information is not intended to replace advice   given to you by your health care provider. Make sure you discuss any questions you have with your health care provider.   Document Released: 11/09/2000 Document Revised: 09/30/2014 Document Reviewed: 08/21/2013 Elsevier Interactive Patient Education 2016 [employer]  

## 2015-04-20 NOTE — Progress Notes (Signed)
Pre visit review using our clinic review tool, if applicable. No additional management support is needed unless otherwise documented below in the visit note. 

## 2015-04-20 NOTE — Progress Notes (Signed)
Subjective:    Patient ID: Daniel Woodard, male    DOB: [birth year], 81 y.o.   MRN: [id number]  Chief Complaint  Patient presents with  . Cough    around 2 or 2:30 in the morning he wakes up with a bad cough, SOB, runny nose, does go on through the day but not as bad    HPI:  Daniel Woodard is a 81 y.o. male who  has a past medical history of ALLERGIC RHINITIS (04/09/2007); BACTEREMIA, MYCOBACTERIUM AVIUM COMPLEX (04/09/2007); BENIGN PROSTATIC HYPERTROPHY (04/09/2007); BRADYCARDIA (05/11/2009); COLONIC POLYPS, HX OF (04/09/2007); COPD (04/09/2007); Cough (03/27/2008); DEPRESSION (04/09/2007); DIABETES MELLITUS, TYPE II (10/09/2007); DIVERTICULOSIS, COLON (04/09/2007); DIZZINESS (10/06/2008); GLUCOSE INTOLERANCE (04/09/2007); HYPERLIPIDEMIA (03/27/2007); HYPERTENSION (03/27/2007); PERIPHERAL VASCULAR DISEASE (03/27/2007); PNEUMONIA ORGANISM NOS (11/08/2007); SHINGLES, HX OF (04/09/2007); SHOULDER PAIN, BILATERAL (08/01/2007); Blood transfusion without reported diagnosis; Ulcer; Complete heart block ([place]); Atrial fibrillation ([place]); and Carotid artery occlusion. and presents today for an acute office visit.   Associated symptoms of cough, congestion and occasional shortness of breath have been going on for a couple of weeks . Timing of the symptoms is worst in the morning around 2 am and then gets better throughout the day. Does occasionally have itchy, watery eyes. Modifying factor include Alkaseltzer which did not help very much. Denies recent antibiotic use.   Allergies  Allergen Reactions  . Ace Inhibitors     REACTION: cough     Current Outpatient Prescriptions on File Prior to Visit  Medication Sig Dispense Refill  . amLODipine (NORVASC) 10 MG tablet Take 1 tablet (10 mg total) by mouth daily. 90 tablet 3  . aspirin 325 MG tablet Take 325 mg by mouth daily.      . Coenzyme Q10 (COQ-10) 200 MG CAPS Take 200 mg by mouth daily.     Marland Kitchen losartan-hydrochlorothiazide (HYZAAR) 100-25 MG per  tablet Take 1 tablet by mouth daily. 90 tablet 3  . metoprolol succinate (TOPROL-XL) 25 MG 24 hr tablet Take 1 tablet (25 mg total) by mouth daily. 90 tablet 3  . omeprazole (PRILOSEC) 20 MG capsule Take 1 capsule (20 mg total) by mouth daily. 90 capsule 3  . psyllium (METAMUCIL) 58.6 % powder 2 caps every day     . rosuvastatin (CRESTOR) 20 MG tablet Take 1 tablet (20 mg total) by mouth at bedtime. 90 tablet 3   No current facility-administered medications on file prior to visit.    Review of Systems  Constitutional: Negative for fever and chills.  HENT: Positive for congestion and rhinorrhea. Negative for ear pain, postnasal drip, sinus pressure, sore throat and voice change.   Respiratory: Positive for cough and shortness of breath.       Objective:    BP 102/82 mmHg  Pulse 93  Temp(Src) 97.9 F (36.6 C) (Oral)  Resp 20  Ht 6\' 1"  (1.854 m)  Wt 201 lb 12.8 oz (91.536 kg)  BMI 26.63 kg/m2  SpO2 90% Nursing note and vital signs reviewed.  Physical Exam  Constitutional: He is oriented to person, place, and time. He appears well-developed and well-nourished. No distress.  HENT:  Right Ear: Hearing, tympanic membrane, external ear and ear canal normal.  Left Ear: Hearing, tympanic membrane, external ear and ear canal normal.  Nose: Nose normal. Right sinus exhibits no maxillary sinus tenderness and no frontal sinus tenderness. Left sinus exhibits no maxillary sinus tenderness and no frontal sinus tenderness.  Mouth/Throat: Uvula is midline, oropharynx is clear and moist and mucous membranes are  normal.  Neck: Neck supple.  Cardiovascular: Normal rate, regular rhythm, normal heart sounds and intact distal pulses.   Pulmonary/Chest: Effort normal and breath sounds normal.  Lymphadenopathy:    He has no cervical adenopathy.  Neurological: He is alert and oriented to person, place, and time.  Skin: Skin is warm and dry.  Psychiatric: He has a normal mood and affect. His behavior is  normal. Judgment and thought content normal.       Assessment & Plan:   Problem List Items Addressed This Visit      Respiratory   Acute upper respiratory infection - Primary    Symptoms and exam consistent with acute upper respiratory infection, however cannot rule out underlying bronchitis. Start azithromycin. Start Delsym or Robitussin as needed for cough. Start Flonase. Continue over-the-counter medications as needed for symptom relief and supportive care. Follow-up if symptoms worsen or fail to improve.      Relevant Medications   azithromycin (ZITHROMAX) 250 MG tablet   fluticasone (FLONASE) 50 MCG/ACT nasal spray

## 2015-04-20 NOTE — Assessment & Plan Note (Signed)
Symptoms and exam consistent with acute upper respiratory infection, however cannot rule out underlying bronchitis. Start azithromycin. Start Delsym or Robitussin as needed for cough. Start Flonase. Continue over-the-counter medications as needed for symptom relief and supportive care. Follow-up if symptoms worsen or fail to improve.

## 2015-04-21 ENCOUNTER — Encounter: Payer: Self-pay | Admitting: Internal Medicine

## 2015-04-21 ENCOUNTER — Telehealth: Payer: Self-pay | Admitting: Internal Medicine

## 2015-04-21 DIAGNOSIS — J069 Acute upper respiratory infection, unspecified: Secondary | ICD-10-CM

## 2015-04-21 LAB — CBC WITH DIFFERENTIAL/PLATELET
BASOS ABS: 0 10*3/uL (ref 0.0–0.1)
BASOS PCT: 0.2 % (ref 0.0–3.0)
EOS ABS: 0.1 10*3/uL (ref 0.0–0.7)
Eosinophils Relative: 1.3 % (ref 0.0–5.0)
HEMATOCRIT: 46.2 % (ref 39.0–52.0)
HEMOGLOBIN: 15.5 g/dL (ref 13.0–17.0)
LYMPHS PCT: 9.4 % — AB (ref 12.0–46.0)
Lymphs Abs: 0.8 10*3/uL (ref 0.7–4.0)
MCHC: 33.6 g/dL (ref 30.0–36.0)
MCV: 91 fl (ref 78.0–100.0)
MONOS PCT: 8.1 % (ref 3.0–12.0)
Monocytes Absolute: 0.7 10*3/uL (ref 0.1–1.0)
Neutro Abs: 7.2 10*3/uL (ref 1.4–7.7)
Neutrophils Relative %: 81 % — ABNORMAL HIGH (ref 43.0–77.0)
Platelets: 212 10*3/uL (ref 150.0–400.0)
RBC: 5.07 Mil/uL (ref 4.22–5.81)
RDW: 13.2 % (ref 11.5–15.5)
WBC: 8.9 10*3/uL (ref 4.0–10.5)

## 2015-04-21 NOTE — Telephone Encounter (Signed)
Patient states pharmacy didn't receive the prescriptions for azithromycin (ZITHROMAX) 250 MG tablet [id number] and fluticasone (FLONASE) 50 MCG/ACT nasal spray [id number]   Can you please resend them to [facility]

## 2015-04-22 MED ORDER — FLUTICASONE PROPIONATE 50 MCG/ACT NA SUSP: 2.0000 | Freq: Every day | NASAL | Status: DC

## 2015-04-22 MED ORDER — AZITHROMYCIN 250 MG PO TABS: ORAL_TABLET | ORAL | Status: DC

## 2015-04-22 NOTE — Telephone Encounter (Signed)
Rx resent and called the pharmacy to verify. They did receive it and called pt to let him know.

## 2015-04-28 DIAGNOSIS — H268 Other specified cataract: Secondary | ICD-10-CM | POA: Insufficient documentation

## 2015-05-19 DIAGNOSIS — H25012 Cortical age-related cataract, left eye: Secondary | ICD-10-CM | POA: Insufficient documentation

## 2015-05-19 DIAGNOSIS — H2512 Age-related nuclear cataract, left eye: Secondary | ICD-10-CM | POA: Insufficient documentation

## 2015-05-22 ENCOUNTER — Encounter: Payer: Self-pay | Admitting: Internal Medicine

## 2015-05-22 ENCOUNTER — Ambulatory Visit (INDEPENDENT_AMBULATORY_CARE_PROVIDER_SITE_OTHER): Payer: Medicare Other | Admitting: Internal Medicine

## 2015-05-22 VITALS — BP 100/72 | HR 63 | Temp 98.5°F | Wt 201.0 lb

## 2015-05-22 DIAGNOSIS — Z0000 Encounter for general adult medical examination without abnormal findings: Secondary | ICD-10-CM

## 2015-05-22 DIAGNOSIS — G4700 Insomnia, unspecified: Secondary | ICD-10-CM | POA: Diagnosis not present

## 2015-05-22 DIAGNOSIS — E119 Type 2 diabetes mellitus without complications: Secondary | ICD-10-CM

## 2015-05-22 MED ORDER — TRAZODONE HCL 50 MG PO TABS: 25.0000 mg | ORAL_TABLET | Freq: Every evening | ORAL | Status: DC | PRN

## 2015-05-22 NOTE — Progress Notes (Addendum)
Subjective:    Patient ID: Daniel Woodard, male    DOB: [birth year], 81 y.o.   MRN: [id number]  HPI  Here for wellness and f/u;  Overall doing ok;  Pt denies Chest pain, worsening SOB, DOE, wheezing, orthopnea, PND, worsening LE edema, palpitations, dizziness or syncope.  Pt denies neurological change such as new headache, facial or extremity weakness.  Pt denies polydipsia, polyuria, or low sugar symptoms. Pt states overall good compliance with treatment and medications, good tolerability, and has been trying to follow appropriate diet.  Pt denies worsening depressive symptoms, suicidal ideation or panic. No fever, night sweats, wt loss, loss of appetite, or other constitutional symptoms.  Pt states good ability with ADL's, has low fall risk, home safety reviewed and adequate, no other significant changes in hearing or vision, and only occasionally active with exercise.  No current complaints.  S/p bilat cataracts and couldn't be more happy with vastly improved vision.  Does also have significant insominia most evenings, asks for sleep aid that is not addictive BP Readings from Last 3 Encounters:  05/22/15 100/72  04/20/15 102/82  02/03/15 108/64   Wt Readings from Last 3 Encounters:  05/22/15 201 lb (91.173 kg)  04/20/15 201 lb 12.8 oz (91.536 kg)  02/03/15 206 lb (93.441 kg)   Past Medical History  Diagnosis Date  . ALLERGIC RHINITIS 04/09/2007  . BACTEREMIA, 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  . BENIGN PROSTATIC HYPERTROPHY 04/09/2007  . BRADYCARDIA 05/11/2009  . COLONIC POLYPS, HX OF 04/09/2007    ADENOMATOUS POLYPS [id number]  . COPD 04/09/2007  . Cough 03/27/2008  . DEPRESSION 04/09/2007  . DIABETES MELLITUS, TYPE II 10/09/2007  . DIVERTICULOSIS, COLON 04/09/2007  . DIZZINESS 10/06/2008  . GLUCOSE INTOLERANCE 04/09/2007  . HYPERLIPIDEMIA 03/27/2007  . HYPERTENSION 03/27/2007  . PERIPHERAL VASCULAR DISEASE 03/27/2007  . PNEUMONIA ORGANISM NOS 11/08/2007  . SHINGLES, HX OF  04/09/2007  . SHOULDER PAIN, BILATERAL 08/01/2007  . Blood transfusion without reported diagnosis     really not sure thinks 30-40 years ago  . Ulcer     40 years ago  . Complete heart block ([place])   . Atrial fibrillation ([place])   . Carotid artery occlusion    Past Surgical History  Procedure Laterality Date  . Carotid endarterectomy  5/08    left  . S/p pud surgury      ? partial gastrectomy  . Carpal tunnel release      bilateral  . Esophagogastroduodenoscopy  06/07/2004  . Colonoscopy    . Hemorrhoid banding  1990s  . Pacemaker insertion  07/11/2013    MDT ADDRL1 pacemaker implanted by Dr Lovena Le for complete heart block  . Appendectomy      81 years old  . Permanent pacemaker insertion N/A 07/11/2013    Procedure: PERMANENT PACEMAKER INSERTION;  Surgeon: Evans Lance, MD;  Location: [facility] CATH LAB;  Service: Cardiovascular;  Laterality: N/A;    reports that he quit smoking about 15 years ago. His smoking use included Cigarettes. He has a 50 pack-year smoking history. He has never used smokeless tobacco. He reports that he does not drink alcohol or use illicit drugs. family history includes Heart disease in his other and sister; Hypertension in his father, mother, sister, sister, sister, and sister; Lung cancer in his sister; Peripheral vascular disease in his brother; Stroke in his other; Ulcers in his father and sister. There is no history of Colon cancer. Allergies  Allergen Reactions  . Ace Inhibitors  REACTION: cough   Current Outpatient Prescriptions on File Prior to Visit  Medication Sig Dispense Refill  . amLODipine (NORVASC) 10 MG tablet Take 1 tablet (10 mg total) by mouth daily. 90 tablet 3  . aspirin 325 MG tablet Take 325 mg by mouth daily.      . Coenzyme Q10 (COQ-10) 200 MG CAPS Take 200 mg by mouth daily.     . fluticasone (FLONASE) 50 MCG/ACT nasal spray Place 2 sprays into both nostrils daily. 16 g 6  . losartan-hydrochlorothiazide (HYZAAR) 100-25 MG per tablet  Take 1 tablet by mouth daily. 90 tablet 3  . metoprolol succinate (TOPROL-XL) 25 MG 24 hr tablet Take 1 tablet (25 mg total) by mouth daily. 90 tablet 3  . omeprazole (PRILOSEC) 20 MG capsule Take 1 capsule (20 mg total) by mouth daily. 90 capsule 3  . psyllium (METAMUCIL) 58.6 % powder 2 caps every day     . rosuvastatin (CRESTOR) 20 MG tablet Take 1 tablet (20 mg total) by mouth at bedtime. 90 tablet 3   No current facility-administered medications on file prior to visit.   Review of Systems Constitutional: Negative for increased diaphoresis, other activity, appetite or siginficant weight change other than noted HENT: Negative for worsening hearing loss, ear pain, facial swelling, mouth sores and neck stiffness.   Eyes: Negative for other worsening pain, redness or visual disturbance.  Respiratory: Negative for shortness of breath and wheezing  Cardiovascular: Negative for chest pain and palpitations.  Gastrointestinal: Negative for diarrhea, blood in stool, abdominal distention or other pain Genitourinary: Negative for hematuria, flank pain or change in urine volume.  Musculoskeletal: Negative for myalgias or other joint complaints.  Skin: Negative for color change and wound or drainage.  Neurological: Negative for syncope and numbness. other than noted Hematological: Negative for adenopathy. or other swelling Psychiatric/Behavioral: Negative for hallucinations, SI, self-injury, decreased concentration or other worsening agitation.       Objective:   Physical Exam BP 100/72 mmHg  Pulse 63  Temp(Src) 98.5 F (36.9 C) (Oral)  Wt 201 lb (91.173 kg)  SpO2 95% VS noted,  Constitutional: Pt is oriented to person, place, and time. Appears well-developed and well-nourished, in no significant distress Head: Normocephalic and atraumatic.  Right Ear: External ear normal.  Left Ear: External ear normal.  Nose: Nose normal.  Mouth/Throat: Oropharynx is clear and moist.  Eyes:  Conjunctivae and EOM are normal. Pupils are equal, round, and reactive to light.  Neck: Normal range of motion. Neck supple. No JVD present. No tracheal deviation present or significant neck LA or mass Cardiovascular: Normal rate, regular rhythm, normal heart sounds and intact distal pulses.   Pulmonary/Chest: Effort normal and breath sounds without rales or wheezing  Abdominal: Soft. Bowel sounds are normal. NT. No HSM  Musculoskeletal: Normal range of motion. Exhibits no edema.  Lymphadenopathy:  Has no cervical adenopathy.  Neurological: Pt is alert and oriented to person, place, and time. Pt has normal reflexes. No cranial nerve deficit. Motor grossly intact Skin: Skin is warm and dry. No rash noted.  Psychiatric:  Has normal mood and affect. Behavior is normal.         Assessment & Plan:

## 2015-05-22 NOTE — Patient Instructions (Addendum)
Please take all new medication as prescribed - the trazodone for sleep  Please continue all other medications as before, and refills have been done if requested.  Please have the pharmacy call with any other refills you may need.  Please continue your efforts at being more active, low cholesterol diet, and weight control.  You are otherwise up to date with prevention measures today.  Please keep your appointments with your specialists as you may have planned  Please return in 6 months, or sooner if needed, with Lab testing done 3-5 days before

## 2015-05-22 NOTE — Progress Notes (Signed)
Pre visit review using our clinic review tool, if applicable. No additional management support is needed unless otherwise documented below in the visit note. 

## 2015-05-22 NOTE — Assessment & Plan Note (Signed)
oik for trazodone qhs prn,  to f/u any worsening symptoms or concerns

## 2015-05-22 NOTE — Addendum Note (Signed)
Addended by: Biagio Borg on: 05/22/2015 11:03 AM   Modules accepted: Orders

## 2015-07-09 ENCOUNTER — Ambulatory Visit (INDEPENDENT_AMBULATORY_CARE_PROVIDER_SITE_OTHER): Payer: Medicare Other | Admitting: Internal Medicine

## 2015-07-09 ENCOUNTER — Encounter: Payer: Self-pay | Admitting: Internal Medicine

## 2015-07-09 VITALS — BP 108/82 | HR 66 | Ht 73.0 in | Wt 207.4 lb

## 2015-07-09 DIAGNOSIS — I441 Atrioventricular block, second degree: Secondary | ICD-10-CM | POA: Diagnosis not present

## 2015-07-09 LAB — CUP PACEART INCLINIC DEVICE CHECK
Battery Impedance: 134 Ohm
Battery Remaining Longevity: 133 mo
Battery Voltage: 2.77 V
Brady Statistic AP VP Percent: 19 %
Brady Statistic AP VS Percent: 8 %
Brady Statistic AS VP Percent: 55 %
Implantable Lead Implant Date: 20150212
Implantable Lead Location: 753859
Implantable Lead Location: 753860
Implantable Lead Model: 5076
Lead Channel Impedance Value: 588 Ohm
Lead Channel Pacing Threshold Amplitude: 0.625 V
Lead Channel Pacing Threshold Amplitude: 0.75 V
Lead Channel Pacing Threshold Amplitude: 0.75 V
Lead Channel Pacing Threshold Pulse Width: 0.4 ms
Lead Channel Pacing Threshold Pulse Width: 0.4 ms
Lead Channel Setting Pacing Amplitude: 2 V
Lead Channel Setting Pacing Amplitude: 2.5 V
MDC IDC LEAD IMPLANT DT: 20150212
MDC IDC MSMT LEADCHNL RA IMPEDANCE VALUE: 463 Ohm
MDC IDC MSMT LEADCHNL RA SENSING INTR AMPL: 4 mV
MDC IDC MSMT LEADCHNL RV PACING THRESHOLD AMPLITUDE: 0.75 V
MDC IDC MSMT LEADCHNL RV PACING THRESHOLD PULSEWIDTH: 0.4 ms
MDC IDC MSMT LEADCHNL RV PACING THRESHOLD PULSEWIDTH: 0.4 ms
MDC IDC SESS DTM: 20170209131251
MDC IDC SET LEADCHNL RV PACING PULSEWIDTH: 0.4 ms
MDC IDC SET LEADCHNL RV SENSING SENSITIVITY: 4 mV
MDC IDC STAT BRADY AS VS PERCENT: 18 %

## 2015-07-09 NOTE — Progress Notes (Signed)
HPI [name] returns today for followup. He is a pleasant elderly man with a h/o CHB on presentation a year ago who underwent insertion of a DDD PM. In the interim, he admits to having dizziness when he stands.  He has tried to increase his activity. He denies syncope or chest pain. No edema. Allergies  Allergen Reactions  . Ace Inhibitors     REACTION: cough     Current Outpatient Prescriptions  Medication Sig Dispense Refill  . aspirin 325 MG tablet Take 325 mg by mouth daily.      . Coenzyme Q10 (COQ-10) 200 MG CAPS Take 200 mg by mouth daily.     Marland Kitchen losartan-hydrochlorothiazide (HYZAAR) 100-25 MG per tablet Take 1 tablet by mouth daily. 90 tablet 3  . metoprolol succinate (TOPROL-XL) 25 MG 24 hr tablet Take 1 tablet (25 mg total) by mouth daily. 90 tablet 3  . omeprazole (PRILOSEC) 20 MG capsule Take 1 capsule (20 mg total) by mouth daily. 90 capsule 3  . psyllium (METAMUCIL) 58.6 % powder Take 2 caps by mouth every day    . rosuvastatin (CRESTOR) 20 MG tablet Take 1 tablet (20 mg total) by mouth at bedtime. 90 tablet 3   No current facility-administered medications for this visit.     Past Medical History  Diagnosis Date  . ALLERGIC RHINITIS 04/09/2007  . BACTEREMIA, 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  . BENIGN PROSTATIC HYPERTROPHY 04/09/2007  . BRADYCARDIA 05/11/2009  . COLONIC POLYPS, HX OF 04/09/2007    ADENOMATOUS POLYPS [id number]  . COPD 04/09/2007  . Cough 03/27/2008  . DEPRESSION 04/09/2007  . DIABETES MELLITUS, TYPE II 10/09/2007  . DIVERTICULOSIS, COLON 04/09/2007  . DIZZINESS 10/06/2008  . GLUCOSE INTOLERANCE 04/09/2007  . HYPERLIPIDEMIA 03/27/2007  . HYPERTENSION 03/27/2007  . PERIPHERAL VASCULAR DISEASE 03/27/2007  . PNEUMONIA ORGANISM NOS 11/08/2007  . SHINGLES, HX OF 04/09/2007  . SHOULDER PAIN, BILATERAL 08/01/2007  . Blood transfusion without reported diagnosis     really not sure thinks 30-40 years ago  . Ulcer     40 years ago  .  Complete heart block ([place])   . Atrial fibrillation ([place])   . Carotid artery occlusion     ROS:   All systems reviewed and negative except as noted in the HPI.   Past Surgical History  Procedure Laterality Date  . Carotid endarterectomy  5/08    left  . S/p pud surgury      ? partial gastrectomy  . Carpal tunnel release      bilateral  . Esophagogastroduodenoscopy  06/07/2004  . Colonoscopy    . Hemorrhoid banding  1990s  . Pacemaker insertion  07/11/2013    MDT ADDRL1 pacemaker implanted by Dr Lovena Le for complete heart block  . Appendectomy      82 years old  . Permanent pacemaker insertion N/A 07/11/2013    Procedure: PERMANENT PACEMAKER INSERTION;  Surgeon: Evans Lance, MD;  Location: [facility] CATH LAB;  Service: Cardiovascular;  Laterality: N/A;     Family History  Problem Relation Age of Onset  . Heart disease Other     Cardiovascular disorder, CHF  . Stroke Other     1st degree relative male and male  . Colon cancer Neg Hx   . Hypertension Father   . Ulcers Father   . Hypertension Mother   . Hypertension Sister   . Peripheral vascular disease Brother   . Hypertension Sister   . Ulcers Sister  Ulcers  . Hypertension Sister   . Lung cancer Sister     smoked  . Heart disease Sister     Carotid   . Hypertension Sister      Social History   Social History  . Marital Status: Married    Spouse Name: N/A  . Number of Children: 2  . Years of Education: N/A   Occupational History  . 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 Smoking status: Former Smoker -- 1.00 packs/day for 50 years    Types: Cigarettes    Quit date: 08/20/1999  . Smokeless tobacco: Never Used  . Alcohol Use: No  . Drug Use: No  . Sexual Activity: Not on file   Other Topics Concern  . Not on file   Social History Narrative   Daily caffeine      BP 108/82 mmHg  Pulse 66  Ht 6\' 1"  (1.854 m)  Wt 207 lb 6.4 oz (94.076 kg)  BMI 27.37  kg/m2  Physical Exam:  Well appearing 82 yo man, NAD HEENT: Unremarkable Neck:  6 cm JVD, no thyromegally Back:  No CVA tenderness Lungs:  Clear with no wheezes, well healed PM incision.  HEART:  Regular rate rhythm, no murmurs, no rubs, no clicks Abd:  soft, positive bowel sounds, no organomegally, no rebound, no guarding Ext:  2 plus pulses, trace peripheral edema, no cyanosis, no clubbing Skin:  No rashes no nodules Neuro:  CN II through XII intact, motor grossly intact  ECG - nsr with ventricular pacing  DEVICE  Normal device function.  See PaceArt for details.   Assess/Plan: 1. Complete heart block - he is doing well, s/p PPM 2. PPM - his medtronic DDD PM is working normally 3. HTN - his blood pressure is well controlled. 4. Dizziness - his blood pressure is actually a little low. I will reduce her BP meds by asking him to stop his amlodipine.

## 2015-07-09 NOTE — Patient Instructions (Signed)
Medication Instructions:  Your physician has recommended you make the following change in your medication:  1) Stop Amlodipine   Labwork: None ordered   Testing/Procedures: None ordered   Follow-Up: Remote monitoring is used to monitor your Pacemaker  from home. This monitoring reduces the number of office visits required to check your device to one time per year. It allows [place] to keep an eye on the functioning of your device to ensure it is working properly. You are scheduled for a device check from home on 10/08/15. You may send your transmission at any time that day. If you have a wireless device, the transmission will be sent automatically. After your physician reviews your transmission, you will receive a postcard with your next transmission date.  Your physician wants you to follow-up in: 12 months with Dr Knox Saliva will receive a reminder letter in the mail two months in advance. If you don't receive a letter, please call our office to schedule the follow-up appointment.     Any Other Special Instructions Will Be Listed Below (If Applicable).     If you need a refill on your cardiac medications before your next appointment, please call your pharmacy.

## 2015-07-17 DIAGNOSIS — H3554 Dystrophies primarily involving the retinal pigment epithelium: Secondary | ICD-10-CM | POA: Diagnosis not present

## 2015-07-17 DIAGNOSIS — Z961 Presence of intraocular lens: Secondary | ICD-10-CM | POA: Diagnosis not present

## 2015-07-17 DIAGNOSIS — H35053 Retinal neovascularization, unspecified, bilateral: Secondary | ICD-10-CM | POA: Insufficient documentation

## 2015-07-17 DIAGNOSIS — H43821 Vitreomacular adhesion, right eye: Secondary | ICD-10-CM | POA: Diagnosis not present

## 2015-07-17 DIAGNOSIS — H43811 Vitreous degeneration, right eye: Secondary | ICD-10-CM | POA: Diagnosis not present

## 2015-07-17 DIAGNOSIS — H35052 Retinal neovascularization, unspecified, left eye: Secondary | ICD-10-CM | POA: Diagnosis not present

## 2015-09-25 ENCOUNTER — Encounter: Payer: Self-pay | Admitting: Family

## 2015-10-01 ENCOUNTER — Encounter (HOSPITAL_COMMUNITY): Payer: Medicare Other

## 2015-10-01 ENCOUNTER — Ambulatory Visit (HOSPITAL_COMMUNITY)
Admission: RE | Admit: 2015-10-01 | Discharge: 2015-10-01 | Disposition: A | Discharge status: Home / Self Care | Payer: Medicare Other | Source: Ambulatory Visit | Attending: Family | Admitting: Family

## 2015-10-01 ENCOUNTER — Other Ambulatory Visit: Payer: Self-pay | Admitting: Family

## 2015-10-01 ENCOUNTER — Encounter: Payer: Self-pay | Admitting: Family

## 2015-10-01 ENCOUNTER — Ambulatory Visit: Payer: Medicare Other | Admitting: Family

## 2015-10-01 ENCOUNTER — Ambulatory Visit (INDEPENDENT_AMBULATORY_CARE_PROVIDER_SITE_OTHER): Payer: Medicare Other | Admitting: Family

## 2015-10-01 VITALS — BP 154/68 | HR 62 | Temp 97.0°F | Resp 22 | Ht 73.0 in | Wt 201.0 lb

## 2015-10-01 DIAGNOSIS — I6522 Occlusion and stenosis of left carotid artery: Secondary | ICD-10-CM

## 2015-10-01 DIAGNOSIS — Z87891 Personal history of nicotine dependence: Secondary | ICD-10-CM

## 2015-10-01 DIAGNOSIS — E785 Hyperlipidemia, unspecified: Secondary | ICD-10-CM | POA: Insufficient documentation

## 2015-10-01 DIAGNOSIS — Z48812 Encounter for surgical aftercare following surgery on the circulatory system: Secondary | ICD-10-CM

## 2015-10-01 DIAGNOSIS — E119 Type 2 diabetes mellitus without complications: Secondary | ICD-10-CM | POA: Insufficient documentation

## 2015-10-01 DIAGNOSIS — I6521 Occlusion and stenosis of right carotid artery: Secondary | ICD-10-CM | POA: Diagnosis not present

## 2015-10-01 DIAGNOSIS — I10 Essential (primary) hypertension: Secondary | ICD-10-CM | POA: Diagnosis not present

## 2015-10-01 DIAGNOSIS — Z9889 Other specified postprocedural states: Secondary | ICD-10-CM

## 2015-10-01 NOTE — Patient Instructions (Signed)
Stroke Prevention Some medical conditions and behaviors are associated with an increased chance of having a stroke. You may prevent a stroke by making healthy choices and managing medical conditions. HOW CAN I REDUCE MY RISK OF HAVING A STROKE?   Stay physically active. Get at least 30 minutes of activity on most or all days.  Do not smoke. It may also be helpful to avoid exposure to secondhand smoke.  Limit alcohol use. Moderate alcohol use is considered to be:  No more than 2 drinks per day for men.  No more than 1 drink per day for nonpregnant women.  Eat healthy foods. This involves:  Eating 5 or more servings of fruits and vegetables a day.  Making dietary changes that address high blood pressure (hypertension), high cholesterol, diabetes, or obesity.  Manage your cholesterol levels.  Making food choices that are high in fiber and low in saturated fat, trans fat, and cholesterol may control cholesterol levels.  Take any prescribed medicines to control cholesterol as directed by your health care provider.  Manage your diabetes.  Controlling your carbohydrate and sugar intake is recommended to manage diabetes.  Take any prescribed medicines to control diabetes as directed by your health care provider.  Control your hypertension.  Making food choices that are low in salt (sodium), saturated fat, trans fat, and cholesterol is recommended to manage hypertension.  Ask your health care provider if you need treatment to lower your blood pressure. Take any prescribed medicines to control hypertension as directed by your health care provider.  If you are 18-39 years of age, have your blood pressure checked every 3-5 years. If you are 40 years of age or older, have your blood pressure checked every year.  Maintain a healthy weight.  Reducing calorie intake and making food choices that are low in sodium, saturated fat, trans fat, and cholesterol are recommended to manage  weight.  Stop drug abuse.  Avoid taking birth control pills.  Talk to your health care provider about the risks of taking birth control pills if you are over 35 years old, smoke, get migraines, or have ever had a blood clot.  Get evaluated for sleep disorders (sleep apnea).  Talk to your health care provider about getting a sleep evaluation if you snore a lot or have excessive sleepiness.  Take medicines only as directed by your health care provider.  For some people, aspirin or blood thinners (anticoagulants) are helpful in reducing the risk of forming abnormal blood clots that can lead to stroke. If you have the irregular heart rhythm of atrial fibrillation, you should be on a blood thinner unless there is a good reason you cannot take them.  Understand all your medicine instructions.  Make sure that other conditions (such as anemia or atherosclerosis) are addressed. SEEK IMMEDIATE MEDICAL CARE IF:   You have sudden weakness or numbness of the face, arm, or leg, especially on one side of the body.  Your face or eyelid droops to one side.  You have sudden confusion.  You have trouble speaking (aphasia) or understanding.  You have sudden trouble seeing in one or both eyes.  You have sudden trouble walking.  You have dizziness.  You have a loss of balance or coordination.  You have a sudden, severe headache with no known cause.  You have new chest pain or an irregular heartbeat. Any of these symptoms may represent a serious problem that is an emergency. Do not wait to see if the symptoms will   go away. Get medical help at once. Call your local emergency services (911 in [place] Do not drive yourself to the hospital.   This information is not intended to replace advice given to you by your health care provider. Make sure you discuss any questions you have with your health care provider.   Document Released: 06/23/2004 Document Revised: 06/06/2014 Document Reviewed:  11/16/2012 Elsevier Interactive Patient Education 2016 [employer]  

## 2015-10-01 NOTE — Progress Notes (Signed)
Chief Complaint: Follow up Extracranial Carotid Artery Stenosis   History of Present Illness  Daniel Woodard is a 82 y.o. male patient of Dr. Oneida Alar who presents for follow-up evaluation for carotid stenosis. He previously had a left carotid endarterectomy by Dr. Amedeo Plenty in February of 2001.  Patient has no history of TIA or stroke symptom; specifically the patient denies: amaurosis fugax or monocular blindness, facial drooping, hemiplegia, receptive or expressive aphasia.  He has a pacemaker inserted in February, 2015 for heart block.  He denies steal symptoms in either hand/arm, denies dizziness when reaching overhead.   Pt denies claudication sx's with walking. He walks 20 minutes twice daily.  Pt Diabetic: No, he states he is not, but + in his PMHX  Pt smoker: former smoker, quit in 2001   Pt meds include:  Statin : Yes  ASA: Yes  Other anticoagulants/antiplatelets: no    Past Medical History  Diagnosis Date  . ALLERGIC RHINITIS 04/09/2007  . BACTEREMIA, 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  . BENIGN PROSTATIC HYPERTROPHY 04/09/2007  . BRADYCARDIA 05/11/2009  . COLONIC POLYPS, HX OF 04/09/2007    ADENOMATOUS POLYPS [id number]  . COPD 04/09/2007  . Cough 03/27/2008  . DEPRESSION 04/09/2007  . DIABETES MELLITUS, TYPE II 10/09/2007  . DIVERTICULOSIS, COLON 04/09/2007  . DIZZINESS 10/06/2008  . GLUCOSE INTOLERANCE 04/09/2007  . HYPERLIPIDEMIA 03/27/2007  . HYPERTENSION 03/27/2007  . PERIPHERAL VASCULAR DISEASE 03/27/2007  . PNEUMONIA ORGANISM NOS 11/08/2007  . SHINGLES, HX OF 04/09/2007  . SHOULDER PAIN, BILATERAL 08/01/2007  . Blood transfusion without reported diagnosis     really not sure thinks 30-40 years ago  . Ulcer     40 years ago  . Complete heart block ([place])   . Atrial fibrillation ([place])   . Carotid artery occlusion     Social History Social History  Substance Use Topics  . Smoking status: Former Smoker -- 1.00 packs/day for 50 years    Types:  Cigarettes    Quit date: 08/20/1999  . Smokeless tobacco: Never Used  . Alcohol Use: No    Family History Family History  Problem Relation Age of Onset  . Heart disease Other     Cardiovascular disorder, CHF  . Stroke Other     1st degree relative male and male  . Colon cancer Neg Hx   . Hypertension Father   . Ulcers Father   . Hypertension Mother   . Hypertension Sister   . Peripheral vascular disease Brother   . Hypertension Sister   . Ulcers Sister     Ulcers  . Hypertension Sister   . Lung cancer Sister     smoked  . Heart disease Sister     Carotid   . Hypertension Sister     Surgical History Past Surgical History  Procedure Laterality Date  . Carotid endarterectomy  5/08    left  . S/p pud surgury      ? partial gastrectomy  . Carpal tunnel release      bilateral  . Esophagogastroduodenoscopy  06/07/2004  . Colonoscopy    . Hemorrhoid banding  1990s  . Pacemaker insertion  07/11/2013    MDT ADDRL1 pacemaker implanted by Dr Lovena Le for complete heart block  . Appendectomy      82 years old  . Permanent pacemaker insertion N/A 07/11/2013    Procedure: PERMANENT PACEMAKER INSERTION;  Surgeon: Evans Lance, MD;  Location: [facility] CATH LAB;  Service: Cardiovascular;  Laterality: N/A;  Allergies  Allergen Reactions  . Ace Inhibitors     REACTION: cough    Current Outpatient Prescriptions  Medication Sig Dispense Refill  . aspirin 325 MG tablet Take 325 mg by mouth daily.      . Coenzyme Q10 (COQ-10) 200 MG CAPS Take 200 mg by mouth daily.     Marland Kitchen losartan-hydrochlorothiazide (HYZAAR) 100-25 MG per tablet Take 1 tablet by mouth daily. 90 tablet 3  . metoprolol succinate (TOPROL-XL) 25 MG 24 hr tablet Take 1 tablet (25 mg total) by mouth daily. 90 tablet 3  . omeprazole (PRILOSEC) 20 MG capsule Take 1 capsule (20 mg total) by mouth daily. 90 capsule 3  . psyllium (METAMUCIL) 58.6 % powder Take 2 caps by mouth every day    . rosuvastatin (CRESTOR) 20 MG tablet  Take 1 tablet (20 mg total) by mouth at bedtime. 90 tablet 3   No current facility-administered medications for this visit.    Review of Systems : See HPI for pertinent positives and negatives.  Physical Examination  Filed Vitals:   10/01/15 1037 10/01/15 1039 10/01/15 1041  BP: 156/68 99/64 154/68  Pulse: 60 59 62  Temp: 97 F (36.1 C)    TempSrc: Oral    Resp: 22    Height: 6\' 1"  (1.854 m)    Weight: 201 lb (91.173 kg)    SpO2: 93%     Body mass index is 26.52 kg/(m^2).  General: WDWN male in NAD GAIT: normal Eyes: PERRLA Pulmonary: Non-labored, decreased air movement in all posterior fields, no rales, rhonchi, + transient wheezing right posterior fields.  Cardiac: regular rhythm, no detected murmur. Pacemaker palpated subcutaneously in left upper chest.  VASCULAR EXAM Carotid Bruits Left Right   Negative Negative   Aorta is not palpable. Radial pulses are 2+ palpable and equal.  Femoral pulses: right is 1+ palpable, left is 2+ palpable. Popliteal pulses are not palpable. DP pulses are faintly palpable, PT pulses are not palpable. No signs of ischemia in feet, no open wounds.   Gastrointestinal: soft, nontender, BS WNL, no r/g,no palpable masses.  Musculoskeletal: No muscle atrophy/wasting. M/S 5/5 throughout, Extremities without ischemic changes.  Neurologic: A&O X 3; Appropriate Affect, Speech is normal CN 2-12 intact, Pain and light touch intact in extremities, Motor exam as listed above.               Non-Invasive Vascular Imaging CAROTID DUPLEX 10/01/2015   Right ICA: <40% stenosis. Left ICA: CEA site, no restenosis. Right vertebral artery is antegrade; left is bidirectional, right subclavian artery is within normal limits; left is monophasic. No significant change compared to prior  Duplex   Assessment: RODY PROKOSCH is a 82 y.o. male who is s/p left carotid endarterectomy in February of 2001. He has no history of stroke or TIA. Today's carotid Duplex suggests minimal (<40%) stenosis of the right internal carotid artery, widely patent left carotid endarterectomy without evidence of restenosis or hyperplasia, right vertebral artery is antegrade; left is bidirectional, right subclavian artery is within normal limits; left is monophasic. No significant change compared to prior Duplex on 09/19/13. He denies steal symptoms in either hand/arm, denies dizziness when reaching overhead.  Plan: Follow-up in 1 year with Carotid Duplex scan.   I discussed in depth with the patient the nature of atherosclerosis, and emphasized the importance of maximal medical management including strict control of blood pressure, blood glucose, and lipid levels, obtaining regular exercise, and continued cessation of smoking.  The patient is  aware that without maximal medical management the underlying atherosclerotic disease process will progress, limiting the benefit of any interventions. The patient was given information about stroke prevention and what symptoms should prompt the patient to seek immediate medical care. Thank you for allowing [place] to participate in this patient's care.  Clemon Chambers, RN, MSN, FNP-C [facility] Office: [facility] Physician: Oneida Alar  10/01/2015 11:00 AM

## 2015-10-08 ENCOUNTER — Ambulatory Visit (INDEPENDENT_AMBULATORY_CARE_PROVIDER_SITE_OTHER): Payer: Medicare Other | Admitting: *Deleted

## 2015-10-08 ENCOUNTER — Telehealth: Payer: Self-pay | Admitting: Cardiology

## 2015-10-08 DIAGNOSIS — I441 Atrioventricular block, second degree: Secondary | ICD-10-CM | POA: Diagnosis not present

## 2015-10-08 NOTE — Progress Notes (Signed)
Remote pacemaker transmission.   

## 2015-10-08 NOTE — Telephone Encounter (Signed)
Spoke with pt and reminded pt of remote transmission that is due today. Pt verbalized understanding.   

## 2015-10-29 ENCOUNTER — Other Ambulatory Visit (INDEPENDENT_AMBULATORY_CARE_PROVIDER_SITE_OTHER): Payer: Medicare Other

## 2015-10-29 ENCOUNTER — Encounter: Payer: Self-pay | Admitting: Internal Medicine

## 2015-10-29 DIAGNOSIS — E119 Type 2 diabetes mellitus without complications: Secondary | ICD-10-CM | POA: Diagnosis not present

## 2015-10-29 LAB — HEPATIC FUNCTION PANEL
ALT: 14 U/L (ref 0–53)
AST: 16 U/L (ref 0–37)
Albumin: 4.2 g/dL (ref 3.5–5.2)
Alkaline Phosphatase: 61 U/L (ref 39–117)
BILIRUBIN DIRECT: 0.1 mg/dL (ref 0.0–0.3)
BILIRUBIN TOTAL: 0.6 mg/dL (ref 0.2–1.2)
Total Protein: 7.6 g/dL (ref 6.0–8.3)

## 2015-10-29 LAB — LIPID PANEL
CHOL/HDL RATIO: 4
Cholesterol: 145 mg/dL (ref 0–200)
HDL: 32.6 mg/dL — AB (ref >39.00)
LDL CALC: 85 mg/dL (ref 0–99)
NONHDL: 112.05
Triglycerides: 135 mg/dL (ref 0.0–149.0)
VLDL: 27 mg/dL (ref 0.0–40.0)

## 2015-10-29 LAB — BASIC METABOLIC PANEL
BUN: 26 mg/dL — ABNORMAL HIGH (ref 6–23)
CALCIUM: 9.3 mg/dL (ref 8.4–10.5)
CO2: 25 mEq/L (ref 19–32)
CREATININE: 1.38 mg/dL (ref 0.40–1.50)
Chloride: 104 mEq/L (ref 96–112)
GFR: 52.21 mL/min — AB (ref >60.00)
Glucose, Bld: 126 mg/dL — ABNORMAL HIGH (ref 70–99)
Potassium: 4 mEq/L (ref 3.5–5.1)
Sodium: 138 mEq/L (ref 135–145)

## 2015-10-29 LAB — HEMOGLOBIN A1C: Hgb A1c MFr Bld: 6.5 % (ref 4.6–6.5)

## 2015-11-04 ENCOUNTER — Encounter: Payer: Self-pay | Admitting: Cardiology

## 2015-11-11 LAB — CUP PACEART REMOTE DEVICE CHECK
Battery Impedance: 158 Ohm
Brady Statistic AP VP Percent: 22 %
Brady Statistic AP VS Percent: 0 %
Brady Statistic AS VP Percent: 78 %
Brady Statistic AS VS Percent: 0 %
Implantable Lead Implant Date: 20150212
Lead Channel Impedance Value: 475 Ohm
Lead Channel Impedance Value: 611 Ohm
Lead Channel Pacing Threshold Pulse Width: 0.4 ms
Lead Channel Sensing Intrinsic Amplitude: 2.8 mV
Lead Channel Setting Pacing Amplitude: 2 V
MDC IDC LEAD IMPLANT DT: 20150212
MDC IDC LEAD LOCATION: 753859
MDC IDC LEAD LOCATION: 753860
MDC IDC MSMT BATTERY REMAINING LONGEVITY: 124 mo
MDC IDC MSMT BATTERY VOLTAGE: 2.77 V
MDC IDC MSMT LEADCHNL RA PACING THRESHOLD AMPLITUDE: 0.625 V
MDC IDC MSMT LEADCHNL RV PACING THRESHOLD AMPLITUDE: 0.75 V
MDC IDC MSMT LEADCHNL RV PACING THRESHOLD PULSEWIDTH: 0.4 ms
MDC IDC SESS DTM: 20170511131709
MDC IDC SET LEADCHNL RV PACING AMPLITUDE: 2.5 V
MDC IDC SET LEADCHNL RV PACING PULSEWIDTH: 0.4 ms
MDC IDC SET LEADCHNL RV SENSING SENSITIVITY: 4 mV

## 2015-11-13 NOTE — Addendum Note (Signed)
Addended by: Mena Goes on: 11/13/2015 05:05 PM   Modules accepted: Orders

## 2015-11-20 ENCOUNTER — Encounter: Payer: Self-pay | Admitting: Internal Medicine

## 2015-11-20 ENCOUNTER — Ambulatory Visit (INDEPENDENT_AMBULATORY_CARE_PROVIDER_SITE_OTHER)
Admission: RE | Admit: 2015-11-20 | Discharge: 2015-11-20 | Disposition: A | Discharge status: Home / Self Care | Payer: Medicare Other | Source: Ambulatory Visit | Attending: Internal Medicine | Admitting: Internal Medicine

## 2015-11-20 ENCOUNTER — Ambulatory Visit (INDEPENDENT_AMBULATORY_CARE_PROVIDER_SITE_OTHER): Payer: Medicare Other | Admitting: Internal Medicine

## 2015-11-20 VITALS — BP 120/78 | HR 87 | Temp 98.9°F | Resp 20 | Wt 194.0 lb

## 2015-11-20 DIAGNOSIS — R6889 Other general symptoms and signs: Secondary | ICD-10-CM

## 2015-11-20 DIAGNOSIS — I10 Essential (primary) hypertension: Secondary | ICD-10-CM

## 2015-11-20 DIAGNOSIS — R05 Cough: Secondary | ICD-10-CM

## 2015-11-20 DIAGNOSIS — J441 Chronic obstructive pulmonary disease with (acute) exacerbation: Secondary | ICD-10-CM

## 2015-11-20 DIAGNOSIS — E119 Type 2 diabetes mellitus without complications: Secondary | ICD-10-CM | POA: Diagnosis not present

## 2015-11-20 DIAGNOSIS — Z0001 Encounter for general adult medical examination with abnormal findings: Secondary | ICD-10-CM

## 2015-11-20 DIAGNOSIS — R059 Cough, unspecified: Secondary | ICD-10-CM

## 2015-11-20 DIAGNOSIS — E785 Hyperlipidemia, unspecified: Secondary | ICD-10-CM

## 2015-11-20 DIAGNOSIS — R0602 Shortness of breath: Secondary | ICD-10-CM | POA: Diagnosis not present

## 2015-11-20 DIAGNOSIS — I6522 Occlusion and stenosis of left carotid artery: Secondary | ICD-10-CM

## 2015-11-20 MED ORDER — ASPIRIN EC 81 MG PO TBEC: 81.0000 mg | DELAYED_RELEASE_TABLET | Freq: Every day | ORAL | Status: DC

## 2015-11-20 MED ORDER — LEVOFLOXACIN 250 MG PO TABS: 250.0000 mg | ORAL_TABLET | Freq: Every day | ORAL | Status: DC

## 2015-11-20 MED ORDER — HYDROCODONE-HOMATROPINE 5-1.5 MG/5ML PO SYRP: 5.0000 mL | ORAL_SOLUTION | Freq: Four times a day (QID) | ORAL | Status: DC | PRN

## 2015-11-20 MED ORDER — METHYLPREDNISOLONE ACETATE 80 MG/ML IJ SUSP
80.0000 mg | Freq: Once | INTRAMUSCULAR | Status: AC
  Administered 2015-11-20: 80 mg via INTRAMUSCULAR

## 2015-11-20 MED ORDER — PREDNISONE 10 MG PO TABS: ORAL_TABLET | ORAL | Status: DC

## 2015-11-20 MED ORDER — ALBUTEROL SULFATE HFA 108 (90 BASE) MCG/ACT IN AERS: 2.0000 | INHALATION_SPRAY | Freq: Four times a day (QID) | RESPIRATORY_TRACT | Status: DC | PRN

## 2015-11-20 NOTE — Assessment & Plan Note (Signed)
Mild to mod,c/w bronchitis vs pna, for cxr, cough med, antiibiotic asd,  to f/u any worsening symptoms or concerns

## 2015-11-20 NOTE — Assessment & Plan Note (Signed)
Ok to reduce the asa to 81 mg,  to f/u any worsening symptoms or concerns

## 2015-11-20 NOTE — Patient Instructions (Addendum)
You had the steroid shot today  Please take all new medication as prescribed - the antibiotic, cough medicine, prednisone, and the inhaler as needed  OK to reduce the aspirin from 325 mg to 81 mg per day  Please continue all other medications as before, and refills have been done if requested.  Please have the pharmacy call with any other refills you may need.  Please continue your efforts at being more active, low cholesterol diet, and weight control.  Please keep your appointments with your specialists as you may have planned  Please go to the XRAY Department in the Basement (go straight as you get off the elevator) for the x-ray testing  You will be contacted by phone if any changes need to be made immediately.  Otherwise, you will receive a letter about your results with an explanation, but please check with MyChart first.  Please remember to sign up for MyChart if you have not done so, as this will be important to you in the future with finding out test results, communicating by private email, and scheduling acute appointments online when needed.  Please return in 6 months, or sooner if needed, with Lab testing done 3-5 days before

## 2015-11-20 NOTE — Assessment & Plan Note (Signed)
Mild to mod, for depomedrol IM, predpac asd, to f/u any worsening symptoms or concerns 

## 2015-11-20 NOTE — Progress Notes (Signed)
Pre visit review using our clinic review tool, if applicable. No additional management support is needed unless otherwise documented below in the visit note. 

## 2015-11-20 NOTE — Addendum Note (Signed)
Addended by: Della Goo C on: 11/20/2015 03:52 PM   Modules accepted: Orders

## 2015-11-20 NOTE — Progress Notes (Signed)
Subjective:    Patient ID: Daniel Woodard, male    DOB: [birth year], 82 y.o.   MRN: [id number]  HPI  Here with acute onset mild to mod 5 days ST, HA, general weakness and malaise, with prod cough greenish sputum but also mild wheezing and sob, but Pt denies chest pain, orthopnea, PND, increased LE swelling, palpitations, dizziness or syncope. /Pt denies new neurological symptoms such as new headache, or facial or extremity weakness or numbness   Pt denies polydipsia, polyuria, or low sugar symptoms such as weakness or confusion improved with po intake.  Pt states overall good compliance with meds, trying to follow lower cholesterol, diabetic diet, wt overall stable, trying to stay active Also has become uncomfortable with taking the asa 325, would like to go to 81 mg, states has not been on this prior, was started on the 325 yrs ago, no hx of stroke, though does have known carotid dz.  No TIA symptoms  Is only taking it twice per wk as he has become wary of the higher dose Past Medical History  Diagnosis Date  . ALLERGIC RHINITIS 04/09/2007  . BACTEREMIA, 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  . BENIGN PROSTATIC HYPERTROPHY 04/09/2007  . BRADYCARDIA 05/11/2009  . COLONIC POLYPS, HX OF 04/09/2007    ADENOMATOUS POLYPS [id number]  . COPD 04/09/2007  . Cough 03/27/2008  . DEPRESSION 04/09/2007  . DIABETES MELLITUS, TYPE II 10/09/2007  . DIVERTICULOSIS, COLON 04/09/2007  . DIZZINESS 10/06/2008  . GLUCOSE INTOLERANCE 04/09/2007  . HYPERLIPIDEMIA 03/27/2007  . HYPERTENSION 03/27/2007  . PERIPHERAL VASCULAR DISEASE 03/27/2007  . PNEUMONIA ORGANISM NOS 11/08/2007  . SHINGLES, HX OF 04/09/2007  . SHOULDER PAIN, BILATERAL 08/01/2007  . Blood transfusion without reported diagnosis     really not sure thinks 30-40 years ago  . Ulcer     40 years ago  . Complete heart block ([place])   . Atrial fibrillation ([place])   . Carotid artery occlusion    Past Surgical History  Procedure Laterality Date  .  Carotid endarterectomy  5/08    left  . S/p pud surgury      ? partial gastrectomy  . Carpal tunnel release      bilateral  . Esophagogastroduodenoscopy  06/07/2004  . Colonoscopy    . Hemorrhoid banding  1990s  . Pacemaker insertion  07/11/2013    MDT ADDRL1 pacemaker implanted by Dr Lovena Le for complete heart block  . Appendectomy      82 years old  . Permanent pacemaker insertion N/A 07/11/2013    Procedure: PERMANENT PACEMAKER INSERTION;  Surgeon: Evans Lance, MD;  Location: [facility] CATH LAB;  Service: Cardiovascular;  Laterality: N/A;    reports that he quit smoking about 16 years ago. His smoking use included Cigarettes. He has a 50 pack-year smoking history. He has never used smokeless tobacco. He reports that he does not drink alcohol or use illicit drugs. family history includes Heart disease in his other and sister; Hypertension in his father, mother, sister, sister, sister, and sister; Lung cancer in his sister; Peripheral vascular disease in his brother; Stroke in his other; Ulcers in his father and sister. There is no history of Colon cancer. Allergies  Allergen Reactions  . Ace Inhibitors     REACTION: cough   Current Outpatient Prescriptions on File Prior to Visit  Medication Sig Dispense Refill  . aspirin 325 MG tablet Take 325 mg by mouth daily.      . Coenzyme Q10 (COQ-10) 200 MG  CAPS Take 200 mg by mouth daily.     Marland Kitchen losartan-hydrochlorothiazide (HYZAAR) 100-25 MG per tablet Take 1 tablet by mouth daily. 90 tablet 3  . metoprolol succinate (TOPROL-XL) 25 MG 24 hr tablet Take 1 tablet (25 mg total) by mouth daily. 90 tablet 3  . omeprazole (PRILOSEC) 20 MG capsule Take 1 capsule (20 mg total) by mouth daily. 90 capsule 3  . psyllium (METAMUCIL) 58.6 % powder Take 2 caps by mouth every day    . rosuvastatin (CRESTOR) 20 MG tablet Take 1 tablet (20 mg total) by mouth at bedtime. 90 tablet 3   No current facility-administered medications on file prior to visit.    Review of  Systems  Constitutional: Negative for unusual diaphoresis or night sweats HENT: Negative for ear swelling or discharge Eyes: Negative for worsening visual haziness  Respiratory: Negative for choking and stridor.   Gastrointestinal: Negative for distension or worsening eructation Genitourinary: Negative for retention or change in urine volume.  Musculoskeletal: Negative for other MSK pain or swelling Skin: Negative for color change and worsening wound Neurological: Negative for tremors and numbness other than noted  Psychiatric/Behavioral: Negative for decreased concentration or agitation other than above       Objective:   Physical Exam BP 120/78 mmHg  Pulse 87  Temp(Src) 98.9 F (37.2 C) (Oral)  Resp 20  Wt 194 lb (87.998 kg)  SpO2 93% VS noted, mild ill Constitutional: Pt appears in no apparent distress HENT: Head: NCAT.  Right Ear: External ear normal.  Left Ear: External ear normal.  Bilat tm's with mild erythema.  Max sinus areas non tender.  Pharynx with mild erythema, no exudate Eyes: . Pupils are equal, round, and reactive to light. Conjunctivae and EOM are normal Neck: Normal range of motion. Neck supple.  Cardiovascular: Normal rate and regular rhythm.   Pulmonary/Chest: Effort normal and breath sounds with bilat mild rhonchi and scattered wheezing, no rales .  Neurological: Pt is alert. Not confused , motor grossly intact Skin: Skin is warm. No rash, no LE edema Psychiatric: Pt behavior is normal. No agitation.     Assessment & Plan:

## 2015-11-20 NOTE — Assessment & Plan Note (Signed)
stable overall by history and exam, recent data reviewed with pt, and pt to continue medical treatment as before,  to f/u any worsening symptoms or concerns Lab Results  Component Value Date   LDLCALC 85 10/29/2015

## 2015-11-20 NOTE — Assessment & Plan Note (Signed)
stable overall by history and exam, recent data reviewed with pt, and pt to continue medical treatment as before,  to f/u any worsening symptoms or concerns BP Readings from Last 3 Encounters:  11/20/15 120/78  10/01/15 154/68  07/09/15 108/82

## 2015-11-20 NOTE — Assessment & Plan Note (Signed)
stable overall by history and exam, recent data reviewed with pt, and pt to continue medical treatment as before,  to f/u any worsening symptoms or concerns Lab Results  Component Value Date   HGBA1C 6.5 10/29/2015

## 2015-12-02 ENCOUNTER — Other Ambulatory Visit: Payer: Self-pay | Admitting: Internal Medicine

## 2015-12-09 ENCOUNTER — Other Ambulatory Visit: Payer: Self-pay | Admitting: Internal Medicine

## 2016-01-07 ENCOUNTER — Ambulatory Visit (INDEPENDENT_AMBULATORY_CARE_PROVIDER_SITE_OTHER): Payer: Medicare Other | Admitting: *Deleted

## 2016-01-07 DIAGNOSIS — I441 Atrioventricular block, second degree: Secondary | ICD-10-CM | POA: Diagnosis not present

## 2016-01-07 NOTE — Progress Notes (Signed)
Remote pacemaker transmission.   

## 2016-01-11 LAB — CUP PACEART REMOTE DEVICE CHECK
Brady Statistic AP VS Percent: 0 %
Date Time Interrogation Session: 20170810120537
Implantable Lead Location: 753860
Implantable Lead Model: 5076
Implantable Lead Model: 5076
Lead Channel Pacing Threshold Amplitude: 0.75 V
Lead Channel Pacing Threshold Pulse Width: 0.4 ms
Lead Channel Pacing Threshold Pulse Width: 0.4 ms
Lead Channel Setting Pacing Pulse Width: 0.4 ms
Lead Channel Setting Sensing Sensitivity: 4 mV
MDC IDC LEAD IMPLANT DT: 20150212
MDC IDC LEAD IMPLANT DT: 20150212
MDC IDC LEAD LOCATION: 753859
MDC IDC MSMT BATTERY IMPEDANCE: 158 Ohm
MDC IDC MSMT BATTERY REMAINING LONGEVITY: 123 mo
MDC IDC MSMT BATTERY VOLTAGE: 2.77 V
MDC IDC MSMT LEADCHNL RA IMPEDANCE VALUE: 469 Ohm
MDC IDC MSMT LEADCHNL RA PACING THRESHOLD AMPLITUDE: 0.625 V
MDC IDC MSMT LEADCHNL RV IMPEDANCE VALUE: 595 Ohm
MDC IDC SET LEADCHNL RA PACING AMPLITUDE: 2 V
MDC IDC SET LEADCHNL RV PACING AMPLITUDE: 2.5 V
MDC IDC STAT BRADY AP VP PERCENT: 26 %
MDC IDC STAT BRADY AS VP PERCENT: 74 %
MDC IDC STAT BRADY AS VS PERCENT: 0 %

## 2016-01-12 ENCOUNTER — Encounter: Payer: Self-pay | Admitting: Cardiology

## 2016-01-13 ENCOUNTER — Other Ambulatory Visit: Payer: Self-pay | Admitting: Internal Medicine

## 2016-01-14 ENCOUNTER — Other Ambulatory Visit: Payer: Self-pay | Admitting: Internal Medicine

## 2016-01-14 DIAGNOSIS — H43811 Vitreous degeneration, right eye: Secondary | ICD-10-CM | POA: Diagnosis not present

## 2016-01-14 DIAGNOSIS — H35052 Retinal neovascularization, unspecified, left eye: Secondary | ICD-10-CM | POA: Diagnosis not present

## 2016-01-14 DIAGNOSIS — Z83518 Family history of other specified eye disorder: Secondary | ICD-10-CM | POA: Diagnosis not present

## 2016-01-14 DIAGNOSIS — H3554 Dystrophies primarily involving the retinal pigment epithelium: Secondary | ICD-10-CM | POA: Diagnosis not present

## 2016-01-14 DIAGNOSIS — Z961 Presence of intraocular lens: Secondary | ICD-10-CM | POA: Diagnosis not present

## 2016-01-14 DIAGNOSIS — I10 Essential (primary) hypertension: Secondary | ICD-10-CM | POA: Diagnosis not present

## 2016-01-14 DIAGNOSIS — Z950 Presence of cardiac pacemaker: Secondary | ICD-10-CM | POA: Diagnosis not present

## 2016-01-14 DIAGNOSIS — H43821 Vitreomacular adhesion, right eye: Secondary | ICD-10-CM | POA: Diagnosis not present

## 2016-02-10 ENCOUNTER — Ambulatory Visit (INDEPENDENT_AMBULATORY_CARE_PROVIDER_SITE_OTHER): Payer: Medicare Other

## 2016-02-10 DIAGNOSIS — Z23 Encounter for immunization: Secondary | ICD-10-CM | POA: Diagnosis not present

## 2016-03-02 ENCOUNTER — Other Ambulatory Visit: Payer: Self-pay | Admitting: Internal Medicine

## 2016-04-07 ENCOUNTER — Ambulatory Visit (INDEPENDENT_AMBULATORY_CARE_PROVIDER_SITE_OTHER): Payer: Medicare Other | Admitting: *Deleted

## 2016-04-07 DIAGNOSIS — I441 Atrioventricular block, second degree: Secondary | ICD-10-CM | POA: Diagnosis not present

## 2016-04-07 NOTE — Progress Notes (Signed)
Remote pacemaker transmission.   

## 2016-04-08 ENCOUNTER — Encounter: Payer: Self-pay | Admitting: Cardiology

## 2016-04-18 ENCOUNTER — Encounter: Payer: Self-pay | Admitting: Internal Medicine

## 2016-04-18 ENCOUNTER — Other Ambulatory Visit (INDEPENDENT_AMBULATORY_CARE_PROVIDER_SITE_OTHER): Payer: Medicare Other

## 2016-04-18 DIAGNOSIS — Z0001 Encounter for general adult medical examination with abnormal findings: Secondary | ICD-10-CM | POA: Diagnosis not present

## 2016-04-18 DIAGNOSIS — E119 Type 2 diabetes mellitus without complications: Secondary | ICD-10-CM | POA: Diagnosis not present

## 2016-04-18 LAB — CBC WITH DIFFERENTIAL/PLATELET
BASOS PCT: 0.3 % (ref 0.0–3.0)
Basophils Absolute: 0 10*3/uL (ref 0.0–0.1)
Eosinophils Absolute: 0.3 10*3/uL (ref 0.0–0.7)
Eosinophils Relative: 3.8 % (ref 0.0–5.0)
HEMATOCRIT: 45.7 % (ref 39.0–52.0)
Hemoglobin: 15.4 g/dL (ref 13.0–17.0)
LYMPHS PCT: 25.7 % (ref 12.0–46.0)
Lymphs Abs: 1.7 10*3/uL (ref 0.7–4.0)
MCHC: 33.8 g/dL (ref 30.0–36.0)
MCV: 90.8 fl (ref 78.0–100.0)
MONOS PCT: 11.9 % (ref 3.0–12.0)
Monocytes Absolute: 0.8 10*3/uL (ref 0.1–1.0)
NEUTROS ABS: 4 10*3/uL (ref 1.4–7.7)
Neutrophils Relative %: 58.3 % (ref 43.0–77.0)
PLATELETS: 185 10*3/uL (ref 150.0–400.0)
RBC: 5.03 Mil/uL (ref 4.22–5.81)
RDW: 13.1 % (ref 11.5–15.5)
WBC: 6.8 10*3/uL (ref 4.0–10.5)

## 2016-04-18 LAB — BASIC METABOLIC PANEL
BUN: 27 mg/dL — AB (ref 6–23)
CHLORIDE: 104 meq/L (ref 96–112)
CO2: 25 mEq/L (ref 19–32)
Calcium: 8.9 mg/dL (ref 8.4–10.5)
Creatinine, Ser: 1.5 mg/dL (ref 0.40–1.50)
GFR: 47.37 mL/min — ABNORMAL LOW (ref >60.00)
GLUCOSE: 119 mg/dL — AB (ref 70–99)
POTASSIUM: 4.4 meq/L (ref 3.5–5.1)
Sodium: 140 mEq/L (ref 135–145)

## 2016-04-18 LAB — LIPID PANEL
CHOLESTEROL: 146 mg/dL (ref 0–200)
HDL: 34.4 mg/dL — ABNORMAL LOW (ref >39.00)
LDL Cholesterol: 90 mg/dL (ref 0–99)
NonHDL: 111.92
TRIGLYCERIDES: 109 mg/dL (ref 0.0–149.0)
Total CHOL/HDL Ratio: 4
VLDL: 21.8 mg/dL (ref 0.0–40.0)

## 2016-04-18 LAB — URINALYSIS, ROUTINE W REFLEX MICROSCOPIC
Hgb urine dipstick: NEGATIVE
Leukocytes, UA: NEGATIVE
NITRITE: NEGATIVE
PH: 5 (ref 5.0–8.0)
RBC / HPF: NONE SEEN (ref 0–?)
Total Protein, Urine: NEGATIVE
UROBILINOGEN UA: 0.2 (ref 0.0–1.0)
Urine Glucose: NEGATIVE
WBC UA: NONE SEEN (ref 0–?)

## 2016-04-18 LAB — MICROALBUMIN / CREATININE URINE RATIO
CREATININE, U: 347.8 mg/dL
MICROALB/CREAT RATIO: 0.9 mg/g (ref 0.0–30.0)
Microalb, Ur: 3.2 mg/dL — ABNORMAL HIGH (ref 0.0–1.9)

## 2016-04-18 LAB — HEPATIC FUNCTION PANEL
ALT: 17 U/L (ref 0–53)
AST: 17 U/L (ref 0–37)
Albumin: 4.1 g/dL (ref 3.5–5.2)
Alkaline Phosphatase: 61 U/L (ref 39–117)
BILIRUBIN DIRECT: 0.1 mg/dL (ref 0.0–0.3)
BILIRUBIN TOTAL: 0.5 mg/dL (ref 0.2–1.2)
Total Protein: 7.3 g/dL (ref 6.0–8.3)

## 2016-04-18 LAB — HEMOGLOBIN A1C: HEMOGLOBIN A1C: 6.4 % (ref 4.6–6.5)

## 2016-04-18 LAB — TSH: TSH: 3.07 u[IU]/mL (ref 0.35–4.50)

## 2016-05-04 LAB — CUP PACEART REMOTE DEVICE CHECK
Battery Voltage: 2.77 V
Brady Statistic AP VS Percent: 0 %
Brady Statistic AS VP Percent: 73 %
Brady Statistic AS VS Percent: 0 %
Date Time Interrogation Session: 20171109151225
Implantable Lead Location: 753860
Implantable Lead Model: 5076
Lead Channel Impedance Value: 627 Ohm
Lead Channel Pacing Threshold Amplitude: 0.625 V
Lead Channel Pacing Threshold Amplitude: 0.75 V
Lead Channel Pacing Threshold Pulse Width: 0.4 ms
Lead Channel Pacing Threshold Pulse Width: 0.4 ms
Lead Channel Setting Pacing Pulse Width: 0.4 ms
MDC IDC LEAD IMPLANT DT: 20150212
MDC IDC LEAD IMPLANT DT: 20150212
MDC IDC LEAD LOCATION: 753859
MDC IDC MSMT BATTERY IMPEDANCE: 158 Ohm
MDC IDC MSMT BATTERY REMAINING LONGEVITY: 124 mo
MDC IDC MSMT LEADCHNL RA IMPEDANCE VALUE: 483 Ohm
MDC IDC PG IMPLANT DT: 20150212
MDC IDC SET LEADCHNL RA PACING AMPLITUDE: 2 V
MDC IDC SET LEADCHNL RV PACING AMPLITUDE: 2.5 V
MDC IDC SET LEADCHNL RV SENSING SENSITIVITY: 4 mV
MDC IDC STAT BRADY AP VP PERCENT: 27 %

## 2016-05-25 ENCOUNTER — Ambulatory Visit (INDEPENDENT_AMBULATORY_CARE_PROVIDER_SITE_OTHER): Payer: Medicare Other | Admitting: Internal Medicine

## 2016-05-25 ENCOUNTER — Encounter: Payer: Self-pay | Admitting: Internal Medicine

## 2016-05-25 VITALS — BP 130/72 | HR 70 | Temp 98.0°F | Resp 20 | Wt 197.0 lb

## 2016-05-25 DIAGNOSIS — Z0000 Encounter for general adult medical examination without abnormal findings: Secondary | ICD-10-CM

## 2016-05-25 NOTE — Progress Notes (Signed)
Subjective:    Patient ID: Daniel Woodard, male    DOB: [birth year], 82 y.o.   MRN: [id number]  HPI  Here for wellness and f/u;  Overall doing ok;  Pt denies Chest pain, worsening SOB, DOE, wheezing, orthopnea, PND, worsening LE edema, palpitations, dizziness or syncope.  Pt denies neurological change such as new headache, facial or extremity weakness.  Pt denies polydipsia, polyuria, or low sugar symptoms. Pt states overall good compliance with treatment and medications, good tolerability, and has been trying to follow appropriate diet.  Pt denies worsening depressive symptoms, suicidal ideation or panic. No fever, night sweats, wt loss, loss of appetite, or other constitutional symptoms.  Pt states good ability with ADL's, has low fall risk, home safety reviewed and adequate, no other significant changes in hearing or vision, and only occasionally active with exercise.  No other new history.  Still believes his asa 325 use directly led to his heart block. Wt Readings from Last 3 Encounters:  05/25/16 197 lb (89.4 kg)  11/20/15 194 lb (88 kg)  10/01/15 201 lb (91.2 kg)   BP Readings from Last 3 Encounters:  05/25/16 130/72  11/20/15 120/78  10/01/15 (!) 154/68   Past Medical History:  Diagnosis Date  . ALLERGIC RHINITIS 04/09/2007  . Atrial fibrillation ([place])   . BACTEREMIA, 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  . BENIGN PROSTATIC HYPERTROPHY 04/09/2007  . Blood transfusion without reported diagnosis    really not sure thinks 30-40 years ago  . BRADYCARDIA 05/11/2009  . Carotid artery occlusion   . COLONIC POLYPS, HX OF 04/09/2007   ADENOMATOUS POLYPS [id number]  . Complete heart block ([place])   . COPD 04/09/2007  . Cough 03/27/2008  . DEPRESSION 04/09/2007  . DIABETES MELLITUS, TYPE II 10/09/2007  . DIVERTICULOSIS, COLON 04/09/2007  . DIZZINESS 10/06/2008  . GLUCOSE INTOLERANCE 04/09/2007  . HYPERLIPIDEMIA 03/27/2007  . HYPERTENSION 03/27/2007  . PERIPHERAL VASCULAR DISEASE  03/27/2007  . PNEUMONIA ORGANISM NOS 11/08/2007  . SHINGLES, HX OF 04/09/2007  . SHOULDER PAIN, BILATERAL 08/01/2007  . Ulcer ([place])    40 years ago   Past Surgical History:  Procedure Laterality Date  . APPENDECTOMY     82 years old  . CAROTID ENDARTERECTOMY  5/08   left  . CARPAL TUNNEL RELEASE     bilateral  . COLONOSCOPY    . ESOPHAGOGASTRODUODENOSCOPY  06/07/2004  . HEMORRHOID BANDING  1990s  . PACEMAKER INSERTION  07/11/2013   MDT ADDRL1 pacemaker implanted by Dr Lovena Le for complete heart block  . PERMANENT PACEMAKER INSERTION N/A 07/11/2013   Procedure: PERMANENT PACEMAKER INSERTION;  Surgeon: Evans Lance, MD;  Location[facility] CATH LAB;  Service: Cardiovascular;  Laterality: N/A;  . s/p PUD surgury     ? partial gastrectomy    reports that he quit smoking about 16 years ago. His smoking use included Cigarettes. He has a 50.00 pack-year smoking history. He has never used smokeless tobacco. He reports that he does not drink alcohol or use drugs. family history includes Heart disease in his other and sister; Hypertension in his father, mother, sister, sister, sister, and sister; Lung cancer in his sister; Peripheral vascular disease in his brother; Stroke in his other; Ulcers in his father and sister. Allergies  Allergen Reactions  . Ace Inhibitors     REACTION: cough   Current Outpatient Prescriptions on File Prior to Visit  Medication Sig Dispense Refill  . albuterol (PROVENTIL HFA;VENTOLIN HFA) 108 (90 Base) MCG/ACT inhaler Inhale 2 puffs  into the lungs every 6 (six) hours as needed for wheezing or shortness of breath. 1 Inhaler 11  . aspirin EC 81 MG tablet Take 1 tablet (81 mg total) by mouth daily. 90 tablet 11  . Coenzyme Q10 (COQ-10) 200 MG CAPS Take 200 mg by mouth daily.     . CRESTOR 20 MG tablet TAKE ONE TABLET BY MOUTH AT BEDTIME 90 tablet 3  . levofloxacin (LEVAQUIN) 250 MG tablet Take 1 tablet (250 mg total) by mouth daily. 10 tablet 0  . losartan-hydrochlorothiazide  (HYZAAR) 100-25 MG tablet TAKE ONE TABLET BY MOUTH ONCE DAILY 90 tablet 0  . metoprolol succinate (TOPROL-XL) 25 MG 24 hr tablet TAKE ONE TABLET BY MOUTH ONCE DAILY 90 tablet 0  . omeprazole (PRILOSEC) 20 MG capsule TAKE ONE CAPSULE BY MOUTH ONCE DAILY 90 capsule 3  . psyllium (METAMUCIL) 58.6 % powder Take 2 caps by mouth every day     No current facility-administered medications on file prior to visit.    Review of Systems Constitutional: Negative for increased diaphoresis, or other activity, appetite or siginficant weight change other than noted HENT: Negative for worsening hearing loss, ear pain, facial swelling, mouth sores and neck stiffness.   Eyes: Negative for other worsening pain, redness or visual disturbance.  Respiratory: Negative for choking or stridor Cardiovascular: Negative for other chest pain and palpitations.  Gastrointestinal: Negative for worsening diarrhea, blood in stool, or abdominal distention Genitourinary: Negative for hematuria, flank pain or change in urine volume.  Musculoskeletal: Negative for myalgias or other joint complaints.  Skin: Negative for other color change and wound or drainage.  Neurological: Negative for syncope and numbness. other than noted Hematological: Negative for adenopathy. or other swelling Psychiatric/Behavioral: Negative for hallucinations, SI, self-injury, decreased concentration or other worsening agitation.  All other system neg per pt    Objective:   Physical Exam BP 130/72   Pulse 70   Temp 98 F (36.7 C) (Oral)   Resp 20   Wt 197 lb (89.4 kg)   SpO2 97%   BMI 25.99 kg/m  VS noted, not ill appearing Constitutional: Pt is oriented to person, place, and time. Appears well-developed and well-nourished, in no significant distress Head: Normocephalic and atraumatic  Eyes: Conjunctivae and EOM are normal. Pupils are equal, round, and reactive to light Right Ear: External ear normal.  Left Ear: External ear normal Nose: Nose  normal.  Mouth/Throat: Oropharynx is clear and moist  Neck: Normal range of motion. Neck supple. No JVD present. No tracheal deviation present or significant neck LA or mass Cardiovascular: Normal rate, regular rhythm, normal heart sounds and intact distal pulses.   Pulmonary/Chest: Effort normal and breath sounds without rales or wheezing  Abdominal: Soft. Bowel sounds are normal. NT. No HSM  Musculoskeletal: Normal range of motion. Exhibits no edema Lymphadenopathy: Has no cervical adenopathy.  Neurological: Pt is alert and oriented to person, place, and time. Pt has normal reflexes. No cranial nerve deficit. Motor grossly intact Skin: Skin is warm and dry. No rash noted or new ulcers Psychiatric:  Has normal mood and affect. Behavior is normal.  No other new exam findings    Assessment & Plan:

## 2016-05-25 NOTE — Progress Notes (Signed)
Pre visit review using our clinic review tool, if applicable. No additional management support is needed unless otherwise documented below in the visit note. 

## 2016-05-25 NOTE — Assessment & Plan Note (Signed)

## 2016-05-25 NOTE — Patient Instructions (Signed)
Please continue all other medications as before, and refills have been done if requested.  Please have the pharmacy call with any other refills you may need.  Please continue your efforts at being more active, low cholesterol diet, and weight control.  You are otherwise up to date with prevention measures today.  Please keep your appointments with your specialists as you may have planned  Please return in 6 months, or sooner if needed 

## 2016-05-31 ENCOUNTER — Other Ambulatory Visit: Payer: Self-pay | Admitting: Internal Medicine

## 2016-07-08 ENCOUNTER — Encounter: Payer: Self-pay | Admitting: Internal Medicine

## 2016-07-13 ENCOUNTER — Ambulatory Visit (INDEPENDENT_AMBULATORY_CARE_PROVIDER_SITE_OTHER): Payer: Medicare Other | Admitting: Internal Medicine

## 2016-07-13 ENCOUNTER — Encounter: Payer: Self-pay | Admitting: Internal Medicine

## 2016-07-13 ENCOUNTER — Encounter (INDEPENDENT_AMBULATORY_CARE_PROVIDER_SITE_OTHER): Payer: Self-pay

## 2016-07-13 VITALS — BP 130/80 | HR 64 | Ht 73.0 in | Wt 196.8 lb

## 2016-07-13 DIAGNOSIS — I441 Atrioventricular block, second degree: Secondary | ICD-10-CM

## 2016-07-13 DIAGNOSIS — Z950 Presence of cardiac pacemaker: Secondary | ICD-10-CM

## 2016-07-13 NOTE — Progress Notes (Signed)
HPI Mr. Bon returns today for followup. He is a pleasant elderly man with a h/o CHB on presentation 3 years ago who underwent insertion of a DDD PM. In the interim, he has been stable.  He has tried to increase his activity. He denies syncope or chest pain. No edema. Allergies  Allergen Reactions  . Ace Inhibitors Anaphylaxis    REACTION: cough REACTION: cough REACTION: cough     Current Outpatient Prescriptions  Medication Sig Dispense Refill  . aspirin EC 81 MG tablet Take 1 tablet (81 mg total) by mouth daily. 90 tablet 11  . Coenzyme Q10 (COQ-10) 200 MG CAPS Take 200 mg by mouth daily.     . CRESTOR 20 MG tablet TAKE ONE TABLET BY MOUTH AT BEDTIME 90 tablet 3  . levofloxacin (LEVAQUIN) 250 MG tablet Take 1 tablet (250 mg total) by mouth daily. 10 tablet 0  . losartan-hydrochlorothiazide (HYZAAR) 100-25 MG tablet TAKE ONE TABLET BY MOUTH ONCE DAILY 90 tablet 0  . metoprolol succinate (TOPROL-XL) 25 MG 24 hr tablet TAKE ONE TABLET BY MOUTH ONCE DAILY 90 tablet 0  . omeprazole (PRILOSEC) 20 MG capsule TAKE ONE CAPSULE BY MOUTH ONCE DAILY 90 capsule 3  . psyllium (METAMUCIL) 58.6 % powder Take 2 caps by mouth every day     No current facility-administered medications for this visit.      Past Medical History:  Diagnosis Date  . ALLERGIC RHINITIS 04/09/2007  . Atrial fibrillation ([place])   . BACTEREMIA, 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  . BENIGN PROSTATIC HYPERTROPHY 04/09/2007  . Blood transfusion without reported diagnosis    really not sure thinks 30-40 years ago  . BRADYCARDIA 05/11/2009  . Carotid artery occlusion   . COLONIC POLYPS, HX OF 04/09/2007   ADENOMATOUS POLYPS 8366,2947  . Complete heart block ([place])   . COPD 04/09/2007  . Cough 03/27/2008  . DEPRESSION 04/09/2007  . DIABETES MELLITUS, TYPE II 10/09/2007  . DIVERTICULOSIS, COLON 04/09/2007  . DIZZINESS 10/06/2008  . GLUCOSE INTOLERANCE 04/09/2007  . HYPERLIPIDEMIA 03/27/2007  . HYPERTENSION  03/27/2007  . PERIPHERAL VASCULAR DISEASE 03/27/2007  . PNEUMONIA ORGANISM NOS 11/08/2007  . SHINGLES, HX OF 04/09/2007  . SHOULDER PAIN, BILATERAL 08/01/2007  . Ulcer ([place])    40 years ago    ROS:   All systems reviewed and negative except as noted in the HPI.   Past Surgical History:  Procedure Laterality Date  . APPENDECTOMY     83 years old  . CAROTID ENDARTERECTOMY  5/08   left  . CARPAL TUNNEL RELEASE     bilateral  . COLONOSCOPY    . ESOPHAGOGASTRODUODENOSCOPY  06/07/2004  . HEMORRHOID BANDING  1990s  . PACEMAKER INSERTION  07/11/2013   MDT ADDRL1 pacemaker implanted by Dr Lovena Le for complete heart block  . PERMANENT PACEMAKER INSERTION N/A 07/11/2013   Procedure: PERMANENT PACEMAKER INSERTION;  Surgeon: Evans Lance, MD;  Location: [facility] CATH LAB;  Service: Cardiovascular;  Laterality: N/A;  . s/p PUD surgury     ? partial gastrectomy     Family History  Problem Relation Age of Onset  . Hypertension Father   . Ulcers Father   . Hypertension Mother   . Hypertension Sister   . Peripheral vascular disease Brother   . Hypertension Sister   . Ulcers Sister     Ulcers  . Hypertension Sister   . Lung cancer Sister     smoked  . Heart disease Sister  Carotid   . Hypertension Sister   . Heart disease Other     Cardiovascular disorder, CHF  . Stroke Other     1st degree relative male and male  . Colon cancer Neg Hx      Social History   Social History  . Marital status: Married    Spouse name: N/A  . Number of children: 2  . Years of education: N/A   Occupational History  . 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 Smoking status: Former Smoker    Packs/day: 1.00    Years: 50.00    Types: Cigarettes    Quit date: 08/20/1999  . Smokeless tobacco: Never Used  . Alcohol use No  . Drug use: No  . Sexual activity: Not on file   Other Topics Concern  . Not on file   Social History Narrative   Daily  caffeine      BP 130/80   Pulse 64   Ht _0  (1.854 m)   Wt 196 lb 12.8 oz (89.3 kg)   SpO2 98%   BMI 25.96 kg/m   Physical Exam:  Well appearing 83 yo man, NAD HEENT: Unremarkable Neck:  6 cm JVD, no thyromegally Back:  No CVA tenderness Lungs:  Clear with no wheezes, well healed PM incision.  HEART:  Regular rate rhythm, no murmurs, no rubs, no clicks Abd:  soft, positive bowel sounds, no organomegally, no rebound, no guarding Ext:  2 plus pulses, trace peripheral edema, no cyanosis, no clubbing Skin:  No rashes no nodules Neuro:  CN II through XII intact, motor grossly intact  ECG - nsr with ventricular pacing  DEVICE  Normal device function.  See PaceArt for details.   Assess/Plan: 1. Complete heart block - he is doing well, s/p PPM 2. PPM - his medtronic DDD PM is working normally 3. HTN - his blood pressure is well controlled. 4. Dizziness - this has resolved with stopping his amlodipine.   Mikle Bosworth.D.

## 2016-07-13 NOTE — Patient Instructions (Signed)
Medication Instructions: Your physician recommends that you continue on your current medications as directed. Please refer to the Current Medication list given to you today.   Labwork: None Ordered  Procedures/Testing: None Ordered  Follow-Up: Your physician wants you to follow-up in: 1 YEAR with Dr. Lovena Le. You will receive a reminder letter in the mail two months in advance. If you don't receive a letter, please call our office to schedule the follow-up appointment.   Any Additional Special Instructions Will Be Listed Below (If Applicable).     If you need a refill on your cardiac medications before your next appointment, please call your pharmacy.

## 2016-07-14 LAB — CUP PACEART INCLINIC DEVICE CHECK
Brady Statistic AP VS Percent: 0 %
Brady Statistic AS VS Percent: 0 %
Date Time Interrogation Session: 20180214215142
Implantable Lead Implant Date: 20150212
Implantable Lead Implant Date: 20150212
Implantable Lead Model: 5076
Lead Channel Impedance Value: 471 Ohm
Lead Channel Impedance Value: 602 Ohm
Lead Channel Pacing Threshold Amplitude: 0.75 V
Lead Channel Pacing Threshold Pulse Width: 0.4 ms
Lead Channel Pacing Threshold Pulse Width: 0.4 ms
Lead Channel Setting Pacing Amplitude: 2 V
Lead Channel Setting Sensing Sensitivity: 4 mV
MDC IDC LEAD LOCATION: 753859
MDC IDC LEAD LOCATION: 753860
MDC IDC MSMT BATTERY IMPEDANCE: 158 Ohm
MDC IDC MSMT BATTERY REMAINING LONGEVITY: 123 mo
MDC IDC MSMT BATTERY VOLTAGE: 2.77 V
MDC IDC MSMT LEADCHNL RA PACING THRESHOLD AMPLITUDE: 0.5 V
MDC IDC MSMT LEADCHNL RA SENSING INTR AMPL: 4 mV
MDC IDC PG IMPLANT DT: 20150212
MDC IDC SET LEADCHNL RV PACING AMPLITUDE: 2.5 V
MDC IDC SET LEADCHNL RV PACING PULSEWIDTH: 0.4 ms
MDC IDC STAT BRADY AP VP PERCENT: 30 %
MDC IDC STAT BRADY AS VP PERCENT: 70 %

## 2016-07-20 DIAGNOSIS — H3554 Dystrophies primarily involving the retinal pigment epithelium: Secondary | ICD-10-CM | POA: Diagnosis not present

## 2016-07-20 DIAGNOSIS — H43821 Vitreomacular adhesion, right eye: Secondary | ICD-10-CM | POA: Diagnosis not present

## 2016-07-20 DIAGNOSIS — H35052 Retinal neovascularization, unspecified, left eye: Secondary | ICD-10-CM | POA: Diagnosis not present

## 2016-07-20 DIAGNOSIS — Z961 Presence of intraocular lens: Secondary | ICD-10-CM | POA: Diagnosis not present

## 2016-08-30 ENCOUNTER — Other Ambulatory Visit: Payer: Self-pay | Admitting: Internal Medicine

## 2016-09-05 ENCOUNTER — Other Ambulatory Visit: Payer: Self-pay | Admitting: Internal Medicine

## 2016-09-27 ENCOUNTER — Encounter: Payer: Self-pay | Admitting: Family

## 2016-10-06 ENCOUNTER — Ambulatory Visit (INDEPENDENT_AMBULATORY_CARE_PROVIDER_SITE_OTHER): Payer: Medicare Other | Admitting: Family

## 2016-10-06 ENCOUNTER — Encounter: Payer: Self-pay | Admitting: Family

## 2016-10-06 ENCOUNTER — Ambulatory Visit (HOSPITAL_COMMUNITY)
Admission: RE | Admit: 2016-10-06 | Discharge: 2016-10-06 | Disposition: A | Discharge status: Home / Self Care | Payer: Medicare Other | Source: Ambulatory Visit | Attending: Family | Admitting: Family

## 2016-10-06 VITALS — BP 155/68 | HR 59 | Temp 97.4°F | Resp 18 | Ht 73.0 in | Wt 189.0 lb

## 2016-10-06 DIAGNOSIS — Z9889 Other specified postprocedural states: Secondary | ICD-10-CM

## 2016-10-06 DIAGNOSIS — Z48812 Encounter for surgical aftercare following surgery on the circulatory system: Secondary | ICD-10-CM

## 2016-10-06 DIAGNOSIS — I6522 Occlusion and stenosis of left carotid artery: Secondary | ICD-10-CM | POA: Diagnosis not present

## 2016-10-06 DIAGNOSIS — I771 Stricture of artery: Secondary | ICD-10-CM | POA: Diagnosis not present

## 2016-10-06 DIAGNOSIS — Z87891 Personal history of nicotine dependence: Secondary | ICD-10-CM | POA: Diagnosis not present

## 2016-10-06 DIAGNOSIS — I6521 Occlusion and stenosis of right carotid artery: Secondary | ICD-10-CM | POA: Diagnosis not present

## 2016-10-06 LAB — VAS US CAROTID
LCCADDIAS: 18 cm/s
LCCADSYS: 119 cm/s
LEFT ECA DIAS: -6 cm/s
LICADDIAS: -23 cm/s
LICADSYS: -110 cm/s
LICAPDIAS: 16 cm/s
LICAPSYS: 114 cm/s
Left CCA prox dias: 9 cm/s
Left CCA prox sys: 99 cm/s
RCCADSYS: -124 cm/s
RCCAPSYS: 75 cm/s
RIGHT CCA MID DIAS: 6 cm/s
RIGHT ECA DIAS: 7 cm/s
RIGHT VERTEBRAL DIAS: 2 cm/s
Right CCA prox dias: 3 cm/s

## 2016-10-06 NOTE — Progress Notes (Signed)
Chief Complaint: Follow up Extracranial Carotid Artery Stenosis   History of Present Illness  Daniel Woodard is a 83 y.o. male patient of Dr. Oneida Alar who presents for follow-up evaluation for carotid stenosis. He previously had a left carotid endarterectomy by Dr. Amedeo Plenty in February of 2001.  Patient has no history of TIA or stroke symptom; specifically the patient denies: amaurosis fugax or monocular blindness, unilateral facial drooping, hemiplegia, or receptive or expressive aphasia.  He has a pacemaker inserted in February. He denies steal symptoms in either hand/arm, denies dizziness when reaching overhead.   Pt denies claudication sx's with walking. He walks 20 minutes twice daily.  Pt Diabetic: No, he states he is not, but + in his PMHX  Pt smoker: former smoker, quit in 2001   Pt meds include:  Statin : Yes  ASA: Yes  Other anticoagulants/antiplatelets: no   Past Medical History:  Diagnosis Date  . ALLERGIC RHINITIS 04/09/2007  . Atrial fibrillation ([place])   . BACTEREMIA, 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  . BENIGN PROSTATIC HYPERTROPHY 04/09/2007  . Blood transfusion without reported diagnosis    really not sure thinks 30-40 years ago  . BRADYCARDIA 05/11/2009  . Carotid artery occlusion   . COLONIC POLYPS, HX OF 04/09/2007   ADENOMATOUS POLYPS 4627,0350  . Complete heart block ([place])   . COPD 04/09/2007  . Cough 03/27/2008  . DEPRESSION 04/09/2007  . DIABETES MELLITUS, TYPE II 10/09/2007  . DIVERTICULOSIS, COLON 04/09/2007  . DIZZINESS 10/06/2008  . GLUCOSE INTOLERANCE 04/09/2007  . HYPERLIPIDEMIA 03/27/2007  . HYPERTENSION 03/27/2007  . PERIPHERAL VASCULAR DISEASE 03/27/2007  . PNEUMONIA ORGANISM NOS 11/08/2007  . SHINGLES, HX OF 04/09/2007  . SHOULDER PAIN, BILATERAL 08/01/2007  . Ulcer ([place])    40 years ago    Social History Social History  Substance Use Topics  . Smoking status: Former Smoker    Packs/day: 1.00    Years: 50.00    Types:  Cigarettes    Quit date: 08/20/1999  . Smokeless tobacco: Never Used  . Alcohol use No    Family History Family History  Problem Relation Age of Onset  . Hypertension Father   . Ulcers Father   . Hypertension Mother   . Hypertension Sister   . Peripheral vascular disease Brother   . Hypertension Sister   . Ulcers Sister        Ulcers  . Hypertension Sister   . Lung cancer Sister        smoked  . Heart disease Sister        Carotid   . Hypertension Sister   . Heart disease Other        Cardiovascular disorder, CHF  . Stroke Other        1st degree relative male and male  . Colon cancer Neg Hx     Surgical History Past Surgical History:  Procedure Laterality Date  . APPENDECTOMY     83 years old  . CAROTID ENDARTERECTOMY  5/08   left  . CARPAL TUNNEL RELEASE     bilateral  . COLONOSCOPY    . ESOPHAGOGASTRODUODENOSCOPY  06/07/2004  . HEMORRHOID BANDING  1990s  . PACEMAKER INSERTION  07/11/2013   MDT ADDRL1 pacemaker implanted by Dr Lovena Le for complete heart block  . PERMANENT PACEMAKER INSERTION N/A 07/11/2013   Procedure: PERMANENT PACEMAKER INSERTION;  Surgeon: Evans Lance, MD;  Location: [facility] CATH LAB;  Service: Cardiovascular;  Laterality: N/A;  . s/p PUD surgury     ?  partial gastrectomy    Allergies  Allergen Reactions  . Ace Inhibitors Anaphylaxis    REACTION: cough REACTION: cough REACTION: cough    Current Outpatient Prescriptions  Medication Sig Dispense Refill  . aspirin EC 81 MG tablet Take 1 tablet (81 mg total) by mouth daily. 90 tablet 11  . Coenzyme Q10 (COQ-10) 200 MG CAPS Take 200 mg by mouth daily.     . CRESTOR 20 MG tablet TAKE ONE TABLET BY MOUTH AT BEDTIME 90 tablet 3  . losartan-hydrochlorothiazide (HYZAAR) 100-25 MG tablet TAKE ONE TABLET BY MOUTH ONCE DAILY 90 tablet 2  . metoprolol succinate (TOPROL-XL) 25 MG 24 hr tablet TAKE ONE TABLET BY MOUTH ONCE DAILY 90 tablet 2  . omeprazole (PRILOSEC) 20 MG capsule TAKE ONE CAPSULE BY  MOUTH ONCE DAILY 90 capsule 3  . psyllium (METAMUCIL) 58.6 % powder Take 2 caps by mouth every day     No current facility-administered medications for this visit.     Review of Systems : See HPI for pertinent positives and negatives.  Physical Examination  Vitals:   10/06/16 1001 10/06/16 1003 10/06/16 1006  BP: (!) 161/67 90/65 (!) 155/68  Pulse: 60 60 (!) 59  Resp:  18   Temp:  97.4 F (36.3 C)   TempSrc:  Oral   SpO2:  96%   Weight:  189 lb (85.7 kg)   Height:  _0  (1.854 m)    Body mass index is 24.94 kg/m.  General: WDWN male in NAD, appears younger than stated age.  GAIT: normal Eyes: PERRLA Pulmonary: Respirations are non-labored, adequate air movement in all fields, no rales,rhonchi, or wheezes.  Cardiac: Regular rhythm and rate, no detected murmur. Pacemaker palpated subcutaneously in left upper chest.  VASCULAR EXAM Carotid Bruits Left Right   Negative Negative   Aorta is not palpable. Radial pulses: right is 3+, left is 2+ palpable.  Femoral pulses: right is 1+ palpable, left is 2+ palpable. Popliteal pulses are not palpable. DP pulses are faintly palpable, PT pulses are not palpable. No signs of ischemia in feet, no open wounds.   Gastrointestinal: soft, nontender, BS WNL, no r/g,no palpable masses.  Musculoskeletal: No muscle atrophy/wasting. M/S 5/5 throughout, Extremities without ischemic changes.  Neurologic: A&O X 3; Appropriate Affect, Speech is normal CN 2-12 intact, Pain and light touch intact in extremities, Motor exam as listed above     Assessment: Daniel Woodard is a 83 y.o. male who is s/p left carotid endarterectomy in February of 2001. He has no history of stroke or TIA.  He denies steal symptoms in either hand/arm, denies dizziness when reaching overhead. 55 mm Hg pressure gradient  difference in brachial pressures.   DATA Today's carotid Duplex suggests minimal (<40%) stenosis of the right internal carotid artery, widely patent left carotid endarterectomy without evidence of restenosis or hyperplasia, right vertebral artery is antegrade; left is retrogradel, right subclavian artery waveforms are normal; left are monophasic. No significant change compared to prior Duplex on 10-01-15.   Plan: Follow-up in 1 year with Carotid Duplex scan.    I discussed in depth with the patient the nature of atherosclerosis, and emphasized the importance of maximal medical management including strict control of blood pressure, blood glucose, and lipid levels, obtaining regular exercise, and continued cessation of smoking.  The patient is aware that without maximal medical management the underlying atherosclerotic disease process will progress, limiting the benefit of any interventions. The patient was given information about stroke prevention and  what symptoms should prompt the patient to seek immediate medical care. Thank you for allowing [place] to participate in this patient's care.  Clemon Chambers, RN, MSN, FNP-C [facility] Office: [phone]  Clinic Physician: Oneida Alar  10/06/16 10:20 AM

## 2016-10-06 NOTE — Patient Instructions (Signed)
Stroke Prevention Some medical conditions and behaviors are associated with an increased chance of having a stroke. You may prevent a stroke by making healthy choices and managing medical conditions. How can I reduce my risk of having a stroke?  Stay physically active. Get at least 30 minutes of activity on most or all days.  Do not smoke. It may also be helpful to avoid exposure to secondhand smoke.  Limit alcohol use. Moderate alcohol use is considered to be:  No more than 2 drinks per day for men.  No more than 1 drink per day for nonpregnant women.  Eat healthy foods. This involves:  Eating 5 or more servings of fruits and vegetables a day.  Making dietary changes that address high blood pressure (hypertension), high cholesterol, diabetes, or obesity.  Manage your cholesterol levels.  Making food choices that are high in fiber and low in saturated fat, trans fat, and cholesterol may control cholesterol levels.  Take any prescribed medicines to control cholesterol as directed by your health care provider.  Manage your diabetes.  Controlling your carbohydrate and sugar intake is recommended to manage diabetes.  Take any prescribed medicines to control diabetes as directed by your health care provider.  Control your hypertension.  Making food choices that are low in salt (sodium), saturated fat, trans fat, and cholesterol is recommended to manage hypertension.  Ask your health care provider if you need treatment to lower your blood pressure. Take any prescribed medicines to control hypertension as directed by your health care provider.  If you are 18-39 years of age, have your blood pressure checked every 3-5 years. If you are 40 years of age or older, have your blood pressure checked every year.  Maintain a healthy weight.  Reducing calorie intake and making food choices that are low in sodium, saturated fat, trans fat, and cholesterol are recommended to manage  weight.  Stop drug abuse.  Avoid taking birth control pills.  Talk to your health care provider about the risks of taking birth control pills if you are over 35 years old, smoke, get migraines, or have ever had a blood clot.  Get evaluated for sleep disorders (sleep apnea).  Talk to your health care provider about getting a sleep evaluation if you snore a lot or have excessive sleepiness.  Take medicines only as directed by your health care provider.  For some people, aspirin or blood thinners (anticoagulants) are helpful in reducing the risk of forming abnormal blood clots that can lead to stroke. If you have the irregular heart rhythm of atrial fibrillation, you should be on a blood thinner unless there is a good reason you cannot take them.  Understand all your medicine instructions.  Make sure that other conditions (such as anemia or atherosclerosis) are addressed. Get help right away if:  You have sudden weakness or numbness of the face, arm, or leg, especially on one side of the body.  Your face or eyelid droops to one side.  You have sudden confusion.  You have trouble speaking (aphasia) or understanding.  You have sudden trouble seeing in one or both eyes.  You have sudden trouble walking.  You have dizziness.  You have a loss of balance or coordination.  You have a sudden, severe headache with no known cause.  You have new chest pain or an irregular heartbeat. Any of these symptoms may represent a serious problem that is an emergency. Do not wait to see if the symptoms will go away.   Get medical help at once. Call your local emergency services (911 in [place] Do not drive yourself to the hospital. This information is not intended to replace advice given to you by your health care provider. Make sure you discuss any questions you have with your health care provider. Document Released: 06/23/2004 Document Revised: 10/22/2015 Document Reviewed: 11/16/2012 Elsevier  Interactive Patient Education  2017 Elsevier Inc.      Preventing Cerebrovascular Disease Arteries are blood vessels that carry blood that contains oxygen from the heart to all parts of the body. Cerebrovascular disease affects arteries that supply the brain. Any condition that blocks or disrupts blood flow to the brain can cause cerebrovascular disease. Brain cells that lose blood supply start to die within minutes (stroke). Stroke is the main danger of cerebrovascular disease. Atherosclerosis and high blood pressure are common causes of cerebrovascular disease. Atherosclerosis is narrowing and hardening of an artery that results when fat, cholesterol, calcium, or other substances (plaque) build up inside an artery. Plaque reduces blood flow through the artery. High blood pressure increases the risk of bleeding inside the brain. Making diet and lifestyle changes to prevent atherosclerosis and high blood pressure lowers your risk of cerebrovascular disease. What nutrition changes can be made?  Eat more fruits, vegetables, and whole grains.  Reduce how much saturated fat you eat. To do this, eat less red meat and fewer full-fat dairy products.  Eat healthy proteins instead of red meat. Healthy proteins include:  Fish. Eat fish that contains heart-healthy omega-3 fatty acids, twice a week. Examples include salmon, albacore tuna, mackerel, and herring.  Chicken.  Nuts.  Low-fat or nonfat yogurt.  Avoid processed meats, like bacon and lunchmeat.  Avoid foods that contain:  A lot of sugar, such as sweets and drinks with added sugar.  A lot of salt (sodium). Avoid adding extra salt to your food, as told by your health care provider.  Trans fats, such as margarine and baked goods. Trans fats may be listed as "partially hydrogenated oils" on food labels.  Check food labels to see how much sodium, sugar, and trans fats are in foods.  Use vegetable oils that contain low amounts of  saturated fat, such as olive oil or canola oil. What lifestyle changes can be made?  Drink alcohol in moderation. This means no more than 1 drink a day for nonpregnant women and 2 drinks a day for men. One drink equals 12 oz of beer, 5 oz of wine, or 1 oz of hard liquor.  If you are overweight, ask your health care provider to recommend a weight-loss plan for you. Losing 5-10 lb (2.2-4.5 kg) can reduce your risk of diabetes, atherosclerosis, and high blood pressure.  Exercise for 30?60 minutes on most days, or as much as told by your health care provider.  Do moderate-intensity exercise, such as brisk walking, bicycling, and water aerobics. Ask your health care provider which activities are safe for you.  Do not use any products that contain nicotine or tobacco, such as cigarettes and e-cigarettes. If you need help quitting, ask your health care provider. Why are these changes important? Making these changes lowers your risk of many diseases that can cause cerebrovascular disease and stroke. Stroke is a leading cause of death and disability. Making these changes also improves your overall health and quality of life. What can I do to lower my risk? The following factors make you more likely to develop cerebrovascular disease:  Being overweight.  Smoking.  Being physically   inactive.  Eating a high-fat diet.  Having certain health conditions, such as:  Diabetes.  High blood pressure.  Heart disease.  Atherosclerosis.  High cholesterol.  Sickle cell disease. Talk with your health care provider about your risk for cerebrovascular disease. Work with your health care provider to control diseases that you have that may contribute to cerebrovascular disease. Your health care provider may prescribe medicines to help prevent major causes of cerebrovascular disease. Where to find more information: Learn more about preventing cerebrovascular disease from:  National Heart, Lung, and  Blood Institute: www.nhlbi.nih.gov/health/health-topics/topics/stroke  Centers for Disease Control and Prevention: cdc.gov/stroke/about.htm Summary  Cerebrovascular disease can lead to a stroke.  Atherosclerosis and high blood pressure are major causes of cerebrovascular disease.  Making diet and lifestyle changes can reduce your risk of cerebrovascular disease.  Work with your health care provider to get your risk factors under control to reduce your risk of cerebrovascular disease. This information is not intended to replace advice given to you by your health care provider. Make sure you discuss any questions you have with your health care provider. Document Released: 05/31/2015 Document Revised: 12/04/2015 Document Reviewed: 05/31/2015 Elsevier Interactive Patient Education  2017 Elsevier Inc.  

## 2016-10-12 ENCOUNTER — Ambulatory Visit (INDEPENDENT_AMBULATORY_CARE_PROVIDER_SITE_OTHER): Payer: Medicare Other | Admitting: *Deleted

## 2016-10-12 ENCOUNTER — Telehealth: Payer: Self-pay | Admitting: Cardiology

## 2016-10-12 DIAGNOSIS — I441 Atrioventricular block, second degree: Secondary | ICD-10-CM | POA: Diagnosis not present

## 2016-10-12 LAB — CUP PACEART REMOTE DEVICE CHECK
Battery Impedance: 206 Ohm
Battery Voltage: 2.77 V
Brady Statistic AP VP Percent: 36 %
Brady Statistic AP VS Percent: 0 %
Brady Statistic AS VP Percent: 64 %
Implantable Lead Location: 753859
Implantable Lead Model: 5076
Implantable Lead Model: 5076
Lead Channel Pacing Threshold Amplitude: 0.625 V
Lead Channel Pacing Threshold Pulse Width: 0.4 ms
Lead Channel Pacing Threshold Pulse Width: 0.4 ms
Lead Channel Setting Pacing Amplitude: 2.5 V
Lead Channel Setting Pacing Pulse Width: 0.4 ms
MDC IDC LEAD IMPLANT DT: 20150212
MDC IDC LEAD IMPLANT DT: 20150212
MDC IDC LEAD LOCATION: 753860
MDC IDC MSMT BATTERY REMAINING LONGEVITY: 113 mo
MDC IDC MSMT LEADCHNL RA IMPEDANCE VALUE: 470 Ohm
MDC IDC MSMT LEADCHNL RV IMPEDANCE VALUE: 593 Ohm
MDC IDC MSMT LEADCHNL RV PACING THRESHOLD AMPLITUDE: 0.75 V
MDC IDC PG IMPLANT DT: 20150212
MDC IDC SESS DTM: 20180516163255
MDC IDC SET LEADCHNL RA PACING AMPLITUDE: 2 V
MDC IDC SET LEADCHNL RV SENSING SENSITIVITY: 4 mV
MDC IDC STAT BRADY AS VS PERCENT: 0 %

## 2016-10-12 NOTE — Progress Notes (Signed)
Remote pacemaker transmission.   

## 2016-10-12 NOTE — Telephone Encounter (Signed)
Spoke with pt and reminded pt of remote transmission that is due today. Pt verbalized understanding.   

## 2016-10-13 ENCOUNTER — Encounter: Payer: Self-pay | Admitting: Cardiology

## 2016-10-14 NOTE — Addendum Note (Signed)
Addended by: Lianne Cure A on: 10/14/2016 09:47 AM   Modules accepted: Orders

## 2016-11-23 ENCOUNTER — Ambulatory Visit (INDEPENDENT_AMBULATORY_CARE_PROVIDER_SITE_OTHER): Payer: Medicare Other | Admitting: Internal Medicine

## 2016-11-23 ENCOUNTER — Encounter: Payer: Self-pay | Admitting: Internal Medicine

## 2016-11-23 ENCOUNTER — Other Ambulatory Visit (INDEPENDENT_AMBULATORY_CARE_PROVIDER_SITE_OTHER): Payer: Medicare Other

## 2016-11-23 VITALS — BP 100/66 | HR 65 | Ht 73.0 in | Wt 189.0 lb

## 2016-11-23 DIAGNOSIS — I10 Essential (primary) hypertension: Secondary | ICD-10-CM

## 2016-11-23 DIAGNOSIS — E119 Type 2 diabetes mellitus without complications: Secondary | ICD-10-CM

## 2016-11-23 DIAGNOSIS — E785 Hyperlipidemia, unspecified: Secondary | ICD-10-CM | POA: Diagnosis not present

## 2016-11-23 DIAGNOSIS — Z0000 Encounter for general adult medical examination without abnormal findings: Secondary | ICD-10-CM | POA: Diagnosis not present

## 2016-11-23 LAB — HEPATIC FUNCTION PANEL
ALT: 16 U/L (ref 0–53)
AST: 17 U/L (ref 0–37)
Albumin: 4.3 g/dL (ref 3.5–5.2)
Alkaline Phosphatase: 72 U/L (ref 39–117)
BILIRUBIN DIRECT: 0.1 mg/dL (ref 0.0–0.3)
BILIRUBIN TOTAL: 0.6 mg/dL (ref 0.2–1.2)
TOTAL PROTEIN: 7.4 g/dL (ref 6.0–8.3)

## 2016-11-23 LAB — BASIC METABOLIC PANEL
BUN: 18 mg/dL (ref 6–23)
CALCIUM: 9.1 mg/dL (ref 8.4–10.5)
CO2: 27 meq/L (ref 19–32)
CREATININE: 1.21 mg/dL (ref 0.40–1.50)
Chloride: 102 mEq/L (ref 96–112)
GFR: 60.61 mL/min (ref >60.00)
Glucose, Bld: 80 mg/dL (ref 70–99)
Potassium: 4.4 mEq/L (ref 3.5–5.1)
SODIUM: 135 meq/L (ref 135–145)

## 2016-11-23 LAB — LIPID PANEL
Cholesterol: 144 mg/dL (ref 0–200)
HDL: 37.4 mg/dL — ABNORMAL LOW (ref >39.00)
LDL Cholesterol: 72 mg/dL (ref 0–99)
NONHDL: 106.7
Total CHOL/HDL Ratio: 4
Triglycerides: 174 mg/dL — ABNORMAL HIGH (ref 0.0–149.0)
VLDL: 34.8 mg/dL (ref 0.0–40.0)

## 2016-11-23 LAB — HEMOGLOBIN A1C: HEMOGLOBIN A1C: 6.4 % (ref 4.6–6.5)

## 2016-11-23 NOTE — Progress Notes (Signed)
Subjective:    Patient ID: Daniel Woodard, male    DOB: 12-Apr-1930, 83 y.o.   MRN: [id number]  HPI  Here to f/u; overall doing ok,  Pt denies chest pain, increasing sob or doe, wheezing, orthopnea, PND, increased LE swelling, palpitations, dizziness or syncope.  Pt denies new neurological symptoms such as new headache, or facial or extremity weakness or numbness.  Pt denies polydipsia, polyuria, or low sugar episode.  Pt states overall good compliance with meds, mostly trying to follow appropriate diet, with wt overall stable,  but little exercise however. Has lost 7 lbs with better diet.   Wt Readings from Last 3 Encounters:  11/23/16 189 lb (85.7 kg)  10/06/16 189 lb (85.7 kg)  07/13/16 196 lb 12.8 oz (89.3 kg)   Past Medical History:  Diagnosis Date  . ALLERGIC RHINITIS 04/09/2007  . Atrial fibrillation ([place])   . BACTEREMIA, 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  . BENIGN PROSTATIC HYPERTROPHY 04/09/2007  . Blood transfusion without reported diagnosis    really not sure thinks 30-40 years ago  . BRADYCARDIA 05/11/2009  . Carotid artery occlusion   . COLONIC POLYPS, HX OF 04/09/2007   ADENOMATOUS POLYPS 7062,3762  . Complete heart block ([place])   . COPD 04/09/2007  . Cough 03/27/2008  . DEPRESSION 04/09/2007  . DIABETES MELLITUS, TYPE II 10/09/2007  . DIVERTICULOSIS, COLON 04/09/2007  . DIZZINESS 10/06/2008  . GLUCOSE INTOLERANCE 04/09/2007  . HYPERLIPIDEMIA 03/27/2007  . HYPERTENSION 03/27/2007  . PERIPHERAL VASCULAR DISEASE 03/27/2007  . PNEUMONIA ORGANISM NOS 11/08/2007  . SHINGLES, HX OF 04/09/2007  . SHOULDER PAIN, BILATERAL 08/01/2007  . Ulcer    40 years ago   Past Surgical History:  Procedure Laterality Date  . APPENDECTOMY     83 years old  . CAROTID ENDARTERECTOMY  5/08   left  . CARPAL TUNNEL RELEASE     bilateral  . COLONOSCOPY    . ESOPHAGOGASTRODUODENOSCOPY  06/07/2004  . HEMORRHOID BANDING  1990s  . PACEMAKER INSERTION  07/11/2013   MDT ADDRL1 pacemaker  implanted by Dr Lovena Le for complete heart block  . PERMANENT PACEMAKER INSERTION N/A 07/11/2013   Procedure: PERMANENT PACEMAKER INSERTION;  Surgeon: Evans Lance, MD;  Location: [facility] C[facility] LAB;  Service: Cardiovascular;  Laterality: N/A;  . s/p PUD surgury     ? partial gastrectomy    reports that he quit smoking about 17 years ago. His smoking use included Cigarettes. He has a 50.00 pack-year smoking history. He has never used smokeless tobacco. He reports that he does not drink alcohol or use drugs. family history includes Heart disease in his other and sister; Hypertension in his father, mother, sister, sister, sister, and sister; Lung cancer in his sister; Peripheral vascular disease in his brother; Stroke in his other; Ulcers in his father and sister. Allergies  Allergen Reactions  . Ace Inhibitors Anaphylaxis    REACTION: cough REACTION: cough REACTION: cough   Current Outpatient Prescriptions on File Prior to Visit  Medication Sig Dispense Refill  . aspirin EC 81 MG tablet Take 1 tablet (81 mg total) by mouth daily. 90 tablet 11  . Coenzyme Q10 (COQ-10) 200 MG CAPS Take 200 mg by mouth daily.     . CRESTOR 20 MG tablet TAKE ONE TABLET BY MOUTH AT BEDTIME 90 tablet 3  . losartan-hydrochlorothiazide (HYZAAR) 100-25 MG tablet TAKE ONE TABLET BY MOUTH ONCE DAILY 90 tablet 2  . metoprolol succinate (TOPROL-XL) 25 MG 24 hr tablet TAKE ONE TABLET BY  MOUTH ONCE DAILY 90 tablet 2  . omeprazole (PRILOSEC) 20 MG capsule TAKE ONE CAPSULE BY MOUTH ONCE DAILY 90 capsule 3  . psyllium (METAMUCIL) 58.6 % powder Take 2 caps by mouth every day     No current facility-administered medications on file prior to visit.    Review of Systems  Constitutional: Negative for other unusual diaphoresis or sweats HENT: Negative for ear discharge or swelling Eyes: Negative for other worsening visual disturbances Respiratory: Negative for stridor or other swelling  Gastrointestinal: Negative for worsening  distension or other blood Genitourinary: Negative for retention or other urinary change Musculoskeletal: Negative for other MSK pain or swelling Skin: Negative for color change or other new lesions Neurological: Negative for worsening tremors and other numbness  Psychiatric/Behavioral: Negative for worsening agitation or other fatigue All other system neg per pt    Objective:   Physical Exam BP 100/66   Pulse 65   Ht _0  (1.854 m)   Wt 189 lb (85.7 kg)   SpO2 99%   BMI 24.94 kg/m  VS noted,  Constitutional: Pt appears in NAD HENT: Head: NCAT.  Right Ear: External ear normal.  Left Ear: External ear normal.  Eyes: . Pupils are equal, round, and reactive to light. Conjunctivae and EOM are normal Nose: without d/c or deformity Neck: Neck supple. Gross normal ROM Cardiovascular: Normal rate and regular rhythm.   Pulmonary/Chest: Effort normal and breath sounds without rales or wheezing.  Neurological: Pt is alert. At baseline orientation, motor grossly intact Skin: Skin is warm. No rashes, other new lesions, no LE edema Psychiatric: Pt behavior is normal without agitation  No other exam findings Lab Results  Component Value Date   WBC 6.8 04/18/2016   HGB 15.4 04/18/2016   HCT 45.7 04/18/2016   PLT 185.0 04/18/2016   GLUCOSE 80 11/23/2016   CHOL 144 11/23/2016   TRIG 174.0 (H) 11/23/2016   HDL 37.40 (L) 11/23/2016   LDLDIRECT 189.9 10/02/2007   LDLCALC 72 11/23/2016   ALT 16 11/23/2016   AST 17 11/23/2016   NA 135 11/23/2016   K 4.4 11/23/2016   CL 102 11/23/2016   CREATININE 1.21 11/23/2016   BUN 18 11/23/2016   CO2 27 11/23/2016   TSH 3.07 04/18/2016   PSA 0.65 05/18/2012   HGBA1C 6.4 11/23/2016   MICROALBUR 3.2 (H) 04/18/2016       Assessment & Plan:

## 2016-11-23 NOTE — Patient Instructions (Signed)
Please continue all other medications as before, and refills have been done if requested.  Please have the pharmacy call with any other refills you may need.  Please continue your efforts at being more active, low cholesterol diet, and weight control.  You are otherwise up to date with prevention measures today.  Please keep your appointments with your specialists as you may have planned  Please go to the LAB in the Basement (turn left off the elevator) for the tests to be done today  You will be contacted by phone if any changes need to be made immediately.  Otherwise, you will receive a letter about your results with an explanation, but please check with MyChart first.  Please remember to sign up for MyChart if you have not done so, as this will be important to you in the future with finding out test results, communicating by private email, and scheduling acute appointments online when needed.  If you have Medicare related insurance (such as traditional Medicare, Blue Cross Medicare or United HealthCare Medicare, or similar), Please make an appointment at the Scheduling desk with Jill, the Wellness HealthCoach, for your Wellness Visit in this office, which is a benefit with your insurance.  Please return in 1 year for your yearly visit, or sooner if needed, with Lab testing done 3-5 days before  

## 2016-11-24 ENCOUNTER — Encounter: Payer: Self-pay | Admitting: Internal Medicine

## 2016-11-26 NOTE — Assessment & Plan Note (Signed)
stable overall by history and exam, recent data reviewed with pt, and pt to continue medical treatment as before,  to f/u any worsening symptoms or concerns Lab Results  Component Value Date   LDLCALC 72 11/23/2016

## 2016-11-26 NOTE — Assessment & Plan Note (Signed)
stable overall by history and exam, recent data reviewed with pt, and pt to continue medical treatment as before,  to f/u any worsening symptoms or concerns BP Readings from Last 3 Encounters:  11/23/16 100/66  10/06/16 (!) 155/68  07/13/16 130/80

## 2016-11-26 NOTE — Assessment & Plan Note (Signed)
stable overall by history and exam, recent data reviewed with pt, and pt to continue medical treatment as before,  to f/u any worsening symptoms or concerns Lab Results  Component Value Date   HGBA1C 6.4 11/23/2016

## 2017-01-11 ENCOUNTER — Ambulatory Visit (INDEPENDENT_AMBULATORY_CARE_PROVIDER_SITE_OTHER): Payer: Medicare Other | Admitting: *Deleted

## 2017-01-11 ENCOUNTER — Other Ambulatory Visit: Payer: Self-pay | Admitting: Internal Medicine

## 2017-01-11 DIAGNOSIS — I441 Atrioventricular block, second degree: Secondary | ICD-10-CM

## 2017-01-12 LAB — CUP PACEART REMOTE DEVICE CHECK
Battery Remaining Longevity: 110 mo
Battery Voltage: 2.77 V
Brady Statistic AP VP Percent: 37 %
Brady Statistic AS VP Percent: 61 %
Implantable Lead Implant Date: 20150212
Implantable Lead Location: 753859
Implantable Lead Model: 5076
Implantable Pulse Generator Implant Date: 20150212
Lead Channel Impedance Value: 470 Ohm
Lead Channel Pacing Threshold Amplitude: 0.625 V
Lead Channel Setting Pacing Amplitude: 2 V
Lead Channel Setting Pacing Pulse Width: 0.4 ms
MDC IDC LEAD IMPLANT DT: 20150212
MDC IDC LEAD LOCATION: 753860
MDC IDC MSMT BATTERY IMPEDANCE: 229 Ohm
MDC IDC MSMT LEADCHNL RA PACING THRESHOLD PULSEWIDTH: 0.4 ms
MDC IDC MSMT LEADCHNL RV IMPEDANCE VALUE: 595 Ohm
MDC IDC MSMT LEADCHNL RV PACING THRESHOLD AMPLITUDE: 0.75 V
MDC IDC MSMT LEADCHNL RV PACING THRESHOLD PULSEWIDTH: 0.4 ms
MDC IDC SESS DTM: 20180815131524
MDC IDC SET LEADCHNL RV PACING AMPLITUDE: 2.5 V
MDC IDC SET LEADCHNL RV SENSING SENSITIVITY: 4 mV
MDC IDC STAT BRADY AP VS PERCENT: 1 %
MDC IDC STAT BRADY AS VS PERCENT: 1 %

## 2017-01-12 NOTE — Progress Notes (Signed)
Remote pacemaker check. 

## 2017-01-18 DIAGNOSIS — H35052 Retinal neovascularization, unspecified, left eye: Secondary | ICD-10-CM | POA: Diagnosis not present

## 2017-01-18 DIAGNOSIS — H43821 Vitreomacular adhesion, right eye: Secondary | ICD-10-CM | POA: Diagnosis not present

## 2017-01-18 DIAGNOSIS — H43813 Vitreous degeneration, bilateral: Secondary | ICD-10-CM | POA: Diagnosis not present

## 2017-01-18 DIAGNOSIS — H3554 Dystrophies primarily involving the retinal pigment epithelium: Secondary | ICD-10-CM | POA: Diagnosis not present

## 2017-01-24 ENCOUNTER — Encounter: Payer: Self-pay | Admitting: Cardiology

## 2017-02-13 ENCOUNTER — Other Ambulatory Visit: Payer: Self-pay | Admitting: Internal Medicine

## 2017-03-10 ENCOUNTER — Ambulatory Visit (INDEPENDENT_AMBULATORY_CARE_PROVIDER_SITE_OTHER): Payer: Medicare Other | Admitting: General Practice

## 2017-03-10 DIAGNOSIS — Z23 Encounter for immunization: Secondary | ICD-10-CM | POA: Diagnosis not present

## 2017-04-12 ENCOUNTER — Ambulatory Visit (INDEPENDENT_AMBULATORY_CARE_PROVIDER_SITE_OTHER): Payer: Medicare Other | Admitting: *Deleted

## 2017-04-12 DIAGNOSIS — I441 Atrioventricular block, second degree: Secondary | ICD-10-CM | POA: Diagnosis not present

## 2017-04-12 NOTE — Progress Notes (Signed)
Remote pacemaker transmission.   

## 2017-04-13 LAB — CUP PACEART REMOTE DEVICE CHECK
Battery Impedance: 230 Ohm
Battery Voltage: 2.78 V
Brady Statistic AP VS Percent: 1 %
Brady Statistic AS VP Percent: 61 %
Implantable Lead Location: 753859
Implantable Lead Model: 5076
Implantable Lead Model: 5076
Lead Channel Pacing Threshold Amplitude: 0.625 V
Lead Channel Pacing Threshold Pulse Width: 0.4 ms
Lead Channel Setting Pacing Amplitude: 2.5 V
Lead Channel Setting Pacing Pulse Width: 0.4 ms
MDC IDC LEAD IMPLANT DT: 20150212
MDC IDC LEAD IMPLANT DT: 20150212
MDC IDC LEAD LOCATION: 753860
MDC IDC MSMT BATTERY REMAINING LONGEVITY: 109 mo
MDC IDC MSMT LEADCHNL RA IMPEDANCE VALUE: 476 Ohm
MDC IDC MSMT LEADCHNL RV IMPEDANCE VALUE: 567 Ohm
MDC IDC MSMT LEADCHNL RV PACING THRESHOLD AMPLITUDE: 0.75 V
MDC IDC MSMT LEADCHNL RV PACING THRESHOLD PULSEWIDTH: 0.4 ms
MDC IDC PG IMPLANT DT: 20150212
MDC IDC SESS DTM: 20181114154551
MDC IDC SET LEADCHNL RA PACING AMPLITUDE: 2 V
MDC IDC SET LEADCHNL RV SENSING SENSITIVITY: 4 mV
MDC IDC STAT BRADY AP VP PERCENT: 37 %
MDC IDC STAT BRADY AS VS PERCENT: 1 %

## 2017-04-14 ENCOUNTER — Encounter: Payer: Self-pay | Admitting: Cardiology

## 2017-05-11 ENCOUNTER — Other Ambulatory Visit (INDEPENDENT_AMBULATORY_CARE_PROVIDER_SITE_OTHER): Payer: Medicare Other

## 2017-05-11 DIAGNOSIS — E119 Type 2 diabetes mellitus without complications: Secondary | ICD-10-CM

## 2017-05-11 DIAGNOSIS — Z0000 Encounter for general adult medical examination without abnormal findings: Secondary | ICD-10-CM | POA: Diagnosis not present

## 2017-05-11 LAB — HEPATIC FUNCTION PANEL
ALBUMIN: 4.1 g/dL (ref 3.5–5.2)
ALK PHOS: 64 U/L (ref 39–117)
ALT: 21 U/L (ref 0–53)
AST: 18 U/L (ref 0–37)
BILIRUBIN DIRECT: 0.1 mg/dL (ref 0.0–0.3)
BILIRUBIN TOTAL: 0.6 mg/dL (ref 0.2–1.2)
Total Protein: 7.6 g/dL (ref 6.0–8.3)

## 2017-05-11 LAB — LIPID PANEL
CHOL/HDL RATIO: 3
Cholesterol: 137 mg/dL (ref 0–200)
HDL: 39.6 mg/dL (ref >39.00)
LDL CALC: 73 mg/dL (ref 0–99)
NONHDL: 97.16
TRIGLYCERIDES: 120 mg/dL (ref 0.0–149.0)
VLDL: 24 mg/dL (ref 0.0–40.0)

## 2017-05-11 LAB — CBC WITH DIFFERENTIAL/PLATELET
BASOS ABS: 0.1 10*3/uL (ref 0.0–0.1)
Basophils Relative: 0.9 % (ref 0.0–3.0)
EOS ABS: 0.3 10*3/uL (ref 0.0–0.7)
Eosinophils Relative: 3.9 % (ref 0.0–5.0)
HCT: 46.3 % (ref 39.0–52.0)
Hemoglobin: 15.6 g/dL (ref 13.0–17.0)
Lymphocytes Relative: 23.4 % (ref 12.0–46.0)
Lymphs Abs: 1.5 10*3/uL (ref 0.7–4.0)
MCHC: 33.7 g/dL (ref 30.0–36.0)
MCV: 90.9 fl (ref 78.0–100.0)
MONO ABS: 0.7 10*3/uL (ref 0.1–1.0)
Monocytes Relative: 10.8 % (ref 3.0–12.0)
NEUTROS ABS: 3.9 10*3/uL (ref 1.4–7.7)
Neutrophils Relative %: 61 % (ref 43.0–77.0)
PLATELETS: 162 10*3/uL (ref 150.0–400.0)
RBC: 5.09 Mil/uL (ref 4.22–5.81)
RDW: 13.1 % (ref 11.5–15.5)
WBC: 6.4 10*3/uL (ref 4.0–10.5)

## 2017-05-11 LAB — BASIC METABOLIC PANEL
BUN: 23 mg/dL (ref 6–23)
CHLORIDE: 102 meq/L (ref 96–112)
CO2: 27 meq/L (ref 19–32)
CREATININE: 1.37 mg/dL (ref 0.40–1.50)
Calcium: 8.9 mg/dL (ref 8.4–10.5)
GFR: 52.46 mL/min — ABNORMAL LOW (ref >60.00)
GLUCOSE: 118 mg/dL — AB (ref 70–99)
Potassium: 4.6 mEq/L (ref 3.5–5.1)
Sodium: 136 mEq/L (ref 135–145)

## 2017-05-11 LAB — TSH: TSH: 4.4 u[IU]/mL (ref 0.35–4.50)

## 2017-05-11 LAB — MICROALBUMIN / CREATININE URINE RATIO
CREATININE, U: 157.4 mg/dL
Microalb Creat Ratio: 1.9 mg/g (ref 0.0–30.0)
Microalb, Ur: 3 mg/dL — ABNORMAL HIGH (ref 0.0–1.9)

## 2017-05-11 LAB — HEMOGLOBIN A1C: Hgb A1c MFr Bld: 6.5 % (ref 4.6–6.5)

## 2017-05-11 LAB — URINALYSIS, ROUTINE W REFLEX MICROSCOPIC
HGB URINE DIPSTICK: NEGATIVE
KETONES UR: NEGATIVE
LEUKOCYTES UA: NEGATIVE
NITRITE: NEGATIVE
RBC / HPF: NONE SEEN (ref 0–?)
Specific Gravity, Urine: >=1.03 — AB (ref 1.000–1.030)
TOTAL PROTEIN, URINE-UPE24: NEGATIVE
URINE GLUCOSE: NEGATIVE
Urobilinogen, UA: 0.2 (ref 0.0–1.0)
pH: 6 (ref 5.0–8.0)

## 2017-05-18 ENCOUNTER — Encounter: Payer: Self-pay | Admitting: Internal Medicine

## 2017-05-18 ENCOUNTER — Ambulatory Visit: Payer: Medicare Other | Admitting: Internal Medicine

## 2017-05-18 VITALS — BP 102/68 | HR 68 | Temp 97.6°F | Ht 73.0 in | Wt 198.0 lb

## 2017-05-18 DIAGNOSIS — Z0001 Encounter for general adult medical examination with abnormal findings: Secondary | ICD-10-CM | POA: Diagnosis not present

## 2017-05-18 DIAGNOSIS — E119 Type 2 diabetes mellitus without complications: Secondary | ICD-10-CM | POA: Diagnosis not present

## 2017-05-18 DIAGNOSIS — I10 Essential (primary) hypertension: Secondary | ICD-10-CM | POA: Diagnosis not present

## 2017-05-18 DIAGNOSIS — L989 Disorder of the skin and subcutaneous tissue, unspecified: Secondary | ICD-10-CM | POA: Insufficient documentation

## 2017-05-18 DIAGNOSIS — Z0000 Encounter for general adult medical examination without abnormal findings: Secondary | ICD-10-CM

## 2017-05-18 MED ORDER — ZOSTER VAC RECOMB ADJUVANTED 50 MCG/0.5ML IM SUSR: 0.5000 mL | Freq: Once | INTRAMUSCULAR | 1 refills | Status: AC

## 2017-05-18 NOTE — Assessment & Plan Note (Signed)
Cant r/o kaposi type lesion, for derm referral

## 2017-05-18 NOTE — Assessment & Plan Note (Signed)
stable overall by history and exam, recent data reviewed with pt, and pt to continue medical treatment as before,  to f/u any worsening symptoms or concerns BP Readings from Last 3 Encounters:  05/18/17 102/68  11/23/16 100/66  10/06/16 (!) 155/68

## 2017-05-18 NOTE — Progress Notes (Addendum)
Subjective:   Daniel Woodard is a 83 y.o. male who presents for an Initial Medicare Annual Wellness Visit.  Review of Systems  No ROS.  Medicare Wellness Visit. Additional risk factors are reflected in the social history.  Cardiac Risk Factors include: advanced age (>25mn, >>43women);diabetes mellitus;dyslipidemia;hypertension;male gender Sleep patterns: feels rested on waking, gets up 1-2 times nightly to void and sleeps 6-7 hours nightly.   Home Safety/Smoke Alarms: Feels safe in home. Smoke alarms in place.  Living environment; residence and Firearm Safety: 1-story house/ trailer, no firearms. Lives alone, no needs for DME, good support system Seat Belt Safety/Bike Helmet: Wears seat belt.    Objective:    Today's Vitals   05/18/17 1342  BP: 102/68  Pulse: 68  Temp: 97.6 F (36.4 C)  TempSrc: Oral  SpO2: 97%  Weight: 198 lb (89.8 kg)  Height: 6' 1"  (1.854 m)   Body mass index is 26.12 kg/m.  Advanced Directives 05/18/2017 10/06/2016 10/01/2015 09/25/2014 09/19/2013 07/10/2013  Does Patient Have a Medical Advance Directive? No No No No Patient would like information Patient does not have advance directive  Would patient like information on creating a medical advance directive? No - Patient declined - - Yes - Educational materials given - -    Current Medications (verified) Outpatient Encounter Medications as of 05/18/2017  Medication Sig  . ASPIRIN ADULT LOW STRENGTH 81 MG EC tablet TAKE ONE TABLET BY MOUTH ONCE DAILY  . Coenzyme Q10 (COQ-10) 200 MG CAPS Take 200 mg by mouth daily.   .Marland Kitchenlosartan-hydrochlorothiazide (HYZAAR) 100-25 MG tablet TAKE ONE TABLET BY MOUTH ONCE DAILY  . metoprolol succinate (TOPROL-XL) 25 MG 24 hr tablet TAKE ONE TABLET BY MOUTH ONCE DAILY  . omeprazole (PRILOSEC) 20 MG capsule TAKE ONE CAPSULE BY MOUTH ONCE DAILY  . psyllium (METAMUCIL) 58.6 % powder Take 2 caps by mouth every day  . rosuvastatin (CRESTOR) 20 MG tablet TAKE ONE TABLET BY MOUTH  AT BEDTIME  . Zoster Vaccine Adjuvanted ([facility] injection Inject 0.5 mLs into the muscle once for 1 dose.   No facility-administered encounter medications on file as of 05/18/2017.     Allergies (verified) Ace inhibitors   History: Past Medical History:  Diagnosis Date  . ALLERGIC RHINITIS 04/09/2007  . [place]   . BACTEREMIA, 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  . BENIGN PROSTATIC HYPERTROPHY 04/09/2007  . Blood transfusion without reported diagnosis    really not sure thinks 30-40 years ago  . BRADYCARDIA 05/11/2009  . Carotid artery occlusion   . COLONIC POLYPS, HX OF 04/09/2007   ADENOMATOUS POLYPS 21761,6073 . [place]   . COPD 04/09/2007  . Cough 03/27/2008  . DEPRESSION 04/09/2007  . DIABETES MELLITUS, TYPE II 10/09/2007  . DIVERTICULOSIS, COLON 04/09/2007  . DIZZINESS 10/06/2008  . GLUCOSE INTOLERANCE 04/09/2007  . HYPERLIPIDEMIA 03/27/2007  . HYPERTENSION 03/27/2007  . PERIPHERAL VASCULAR DISEASE 03/27/2007  . PNEUMONIA ORGANISM NOS 11/08/2007  . SHINGLES, HX OF 04/09/2007  . SHOULDER PAIN, BILATERAL 08/01/2007  . Ulcer    40 years ago   Past Surgical History:  Procedure Laterality Date  . APPENDECTOMY     83years old  . CAROTID ENDARTERECTOMY  5/08   left  . CARPAL TUNNEL RELEASE     bilateral  . COLONOSCOPY    . ESOPHAGOGASTRODUODENOSCOPY  06/07/2004  . HEMORRHOID BANDING  1990s  . PACEMAKER INSERTION  07/11/2013   MDT ADDRL1 pacemaker implanted by Dr TLovena Lefor complete heart  block  . PERMANENT PACEMAKER INSERTION N/A 07/11/2013   Procedure: PERMANENT PACEMAKER INSERTION;  Surgeon: Evans Lance, MD;  Location: [facility] CATH LAB;  Service: Cardiovascular;  Laterality: N/A;  . s/p PUD surgury     ? partial gastrectomy   Family History  Problem Relation Age of Onset  . Hypertension Father   . Ulcers Father   . Hypertension Mother   . Hypertension Sister   . Peripheral vascular disease Brother   . Hypertension Sister    . Ulcers Sister        Ulcers  . Hypertension Sister   . Lung cancer Sister        smoked  . Heart disease Sister        Carotid   . Hypertension Sister   . Heart disease Other        Cardiovascular disorder, CHF  . Stroke Other        1st degree relative male and male  . Colon cancer Neg Hx    Social History   Socioeconomic History  . Marital status: Married    Spouse name: None  . Number of children: 2  . Years of education: None  . Highest education level: None  Social Needs  . Financial resource strain: None  . Food insecurity - worry: None  . Food insecurity - inability: None  . Transportation needs - medical: None  . Transportation needs - non-medical: None  Occupational History  . 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 Smoking status: Former Smoker    Packs/day: 1.00    Years: 50.00    Pack years: 50.00    Types: Cigarettes    Last attempt to quit: 08/20/1999    Years since quitting: 17.7  . Smokeless tobacco: Never Used  Substance and Sexual Activity  . Alcohol use: No    Alcohol/week: 0.0 oz  . Drug use: No  . Sexual activity: None  Other Topics Concern  . None  Social History Narrative   Daily caffeine    Tobacco Counseling Counseling given: Not Answered  Activities of Daily Living In your present state of health, do you have any difficulty performing the following activities: 05/18/2017  Hearing? N  Vision? N  Difficulty concentrating or making decisions? N  Walking or climbing stairs? N  Dressing or bathing? N  Doing errands, shopping? N  Preparing Food and eating ? N  Using the Toilet? N  In the past six months, have you accidently leaked urine? N  Do you have problems with loss of bowel control? N  Managing your Medications? N  Managing your Finances? N  Housekeeping or managing your Housekeeping? N  Some recent data might be hidden     Immunizations and Health  Maintenance Immunization History  Administered Date(s) Administered  . H1N1 05/05/2008  . Influenza Split 05/19/2011, 03/06/2012  . Influenza Whole 03/30/2000, 03/17/2008, 04/08/2009, 03/17/2010  . Influenza, High Dose Seasonal PF 02/03/2015, 02/10/2016, 03/10/2017  . Influenza,inj,Quad PF,6+ Mos 03/07/2013, 02/13/2014  . Pneumococcal Conjugate-13 05/21/2013  . Pneumococcal Polysaccharide-23 03/30/2005, 05/13/2010  . Td 07/29/1998, 04/08/2009  . Zoster 04/07/2008   There are no preventive care reminders to display for this patient.  Patient Care Team: Biagio Borg, MD as PCP - General Lovena Le Champ Mungo, MD as Consulting Physician (Cardiology) Tanda Rockers, MD as Consulting Physician (Pulmonary Disease)  Indicate any recent Medical Services you may have received from other than [facility] providers  in the past year (date may be approximate).    Assessment:   This is a routine wellness examination for [place]. Physical assessment deferred to PCP.   Hearing/Vision screen Hearing Screening Comments: Able to hear conversational tones w/o difficulty. No issues reported.  Passed whisper test Vision Screening Comments: appointment yearly   Dietary issues and exercise activities discussed: Current Exercise Habits: Home exercise routine, Type of exercise: walking, Time (Minutes): 30, Frequency (Times/Week): 6, Weekly Exercise (Minutes/Week): 180, Intensity: Mild, Exercise limited by: None identified Diet (meal preparation, eat out, water intake, caffeinated beverages, dairy products, fruits and vegetables): in general, a "healthy" diet  , well balanced, eats a variety of fruits and vegetables daily, limits salt, fat/cholesterol, sugar, caffeine, drinks 2-3 glasses of water daily.  Reviewed heart healthy and diabetic diet, encouraged patient to increase daily water intake.  Goals    . Patient Stated     Stay as healthy and as independent as possible, continue to exercise and eat healthy.       Depression Screen PHQ 2/9 Scores 05/18/2017 05/18/2017 05/25/2016 11/20/2014  PHQ - 2 Score 3 0 0 0  PHQ- 9 Score 3 0 - -    Fall Risk Fall Risk  05/18/2017 05/18/2017 05/25/2016 11/20/2014 05/21/2014  Falls in the past year? No No No No No   Cognitive Function: MMSE - Mini Mental State Exam 05/18/2017  Orientation to time 5  Orientation to Place 5  Registration 3  Attention/ Calculation 5  Recall 2  Language- name 2 objects 2  Language- repeat 1  Language- follow 3 step command 3  Language- read & follow direction 1  Write a sentence 1  Copy design 1  Total score 29        Screening Tests Health Maintenance  Topic Date Due  . OPHTHALMOLOGY EXAM  06/28/2017  . HEMOGLOBIN A1C  11/09/2017  . FOOT EXAM  05/18/2018  . TETANUS/TDAP  04/09/2019  . INFLUENZA VACCINE  Completed  . PNA vac Low Risk Adult  Completed       Plan:    Continue doing brain stimulating activities (puzzles, reading, adult coloring books, staying active) to keep memory sharp.   Continue to eat heart healthy diet (full of fruits, vegetables, whole grains, lean protein, water--limit salt, fat, and sugar intake) and increase physical activity as tolerated.  I have personally reviewed and noted the following in the patient's chart:   . Medical and social history . Use of alcohol, tobacco or illicit drugs  . Current medications and supplements . Functional ability and status . Nutritional status . Physical activity . Advanced directives . List of other physicians . Vitals . Screenings to include cognitive, depression, and falls . Referrals and appointments  In addition, I have reviewed and discussed with patient certain preventive protocols, quality metrics, and best practice recommendations. A written personalized care plan for preventive services as well as general preventive health recommendations were provided to patient.     Michiel Cowboy, RN   05/18/2017   Medical screening  examination/treatment/procedure(s) were performed by non-physician practitioner and as supervising physician I was immediately available for consultation/collaboration. I agree with above. Cathlean Cower, MD

## 2017-05-18 NOTE — Assessment & Plan Note (Signed)

## 2017-05-18 NOTE — Patient Instructions (Addendum)
Your shingles shot was sent to [facility] will be contacted regarding the referral for: Dermatology  Please continue all other medications as before, and refills have been done if requested.  Please have the pharmacy call with any other refills you may need.  Please continue your efforts at being more active, low cholesterol diet, and weight control.  You are otherwise up to date with prevention measures today.  Please keep your appointments with your specialists as you may have planned  Please return in 6 months, or sooner if needed, with Lab testing done 3-5 days before  Continue doing brain stimulating activities (puzzles, reading, adult coloring books, staying active) to keep memory sharp.   Continue to eat heart healthy diet (full of fruits, vegetables, whole grains, lean protein, water--limit salt, fat, and sugar intake) and increase physical activity as tolerated.   Daniel Woodard , Thank you for taking time to come for your Medicare Wellness Visit. I appreciate your ongoing commitment to your health goals. Please review the following plan we discussed and let me know if I can assist you in the future.   These are the goals we discussed: Goals    . Patient Stated     Stay as healthy and as independent as possible, continue exercise and eat healthy.       This is a list of the screening recommended for you and due dates:  Health Maintenance  Topic Date Due  . Complete foot exam   11/20/2015  . Eye exam for diabetics  06/28/2017  . Hemoglobin A1C  11/09/2017  . Tetanus Vaccine  04/09/2019  . Flu Shot  Completed  . Pneumonia vaccines  Completed

## 2017-05-18 NOTE — Progress Notes (Signed)
Subjective:    Patient ID: Daniel Woodard, male    DOB: 03-21-1930, 83 y.o.   MRN: [id number]  HPI  Here for wellness and f/u;  Overall doing ok;  Pt denies Chest pain, worsening SOB, DOE, wheezing, orthopnea, PND, worsening LE edema, palpitations, dizziness or syncope.  Pt denies neurological change such as new headache, facial or extremity weakness.  Pt denies polydipsia, polyuria, or low sugar symptoms. Pt states overall good compliance with treatment and medications, good tolerability, and has been trying to follow appropriate diet.  Pt denies worsening depressive symptoms, suicidal ideation or panic. No fever, night sweats, wt loss, loss of appetite, or other constitutional symptoms.  Pt states good ability with ADL's, has low fall risk, home safety reviewed and adequate, no other significant changes in hearing or vision, and only occasionally active with exercise.  Wife now in NH with dementia and PD; has a new lesion to top of head, sort of purplish and raised, wonders if needs to be checked Past Medical History:  Diagnosis Date  . ALLERGIC RHINITIS 04/09/2007  . Atrial fibrillation ([place])   . BACTEREMIA, 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  . BENIGN PROSTATIC HYPERTROPHY 04/09/2007  . Blood transfusion without reported diagnosis    really not sure thinks 30-40 years ago  . BRADYCARDIA 05/11/2009  . Carotid artery occlusion   . COLONIC POLYPS, HX OF 04/09/2007   ADENOMATOUS POLYPS 8416,6063  . Complete heart block ([place])   . COPD 04/09/2007  . Cough 03/27/2008  . DEPRESSION 04/09/2007  . DIABETES MELLITUS, TYPE II 10/09/2007  . DIVERTICULOSIS, COLON 04/09/2007  . DIZZINESS 10/06/2008  . GLUCOSE INTOLERANCE 04/09/2007  . HYPERLIPIDEMIA 03/27/2007  . HYPERTENSION 03/27/2007  . PERIPHERAL VASCULAR DISEASE 03/27/2007  . PNEUMONIA ORGANISM NOS 11/08/2007  . SHINGLES, HX OF 04/09/2007  . SHOULDER PAIN, BILATERAL 08/01/2007  . Ulcer    40 years ago   Past Surgical History:    Procedure Laterality Date  . APPENDECTOMY     83 years old  . CAROTID ENDARTERECTOMY  5/08   left  . CARPAL TUNNEL RELEASE     bilateral  . COLONOSCOPY    . ESOPHAGOGASTRODUODENOSCOPY  06/07/2004  . HEMORRHOID BANDING  1990s  . PACEMAKER INSERTION  07/11/2013   MDT ADDRL1 pacemaker implanted by Dr Lovena Le for complete heart block  . PERMANENT PACEMAKER INSERTION N/A 07/11/2013   Procedure: PERMANENT PACEMAKER INSERTION;  Surgeon: Evans Lance, MD;  Location: [facility] CATH LAB;  Service: Cardiovascular;  Laterality: N/A;  . s/p PUD surgury     ? partial gastrectomy    reports that he quit smoking about 17 years ago. His smoking use included cigarettes. He has a 50.00 pack-year smoking history. he has never used smokeless tobacco. He reports that he does not drink alcohol or use drugs. family history includes Heart disease in his other and sister; Hypertension in his father, mother, sister, sister, sister, and sister; Lung cancer in his sister; Peripheral vascular disease in his brother; Stroke in his other; Ulcers in his father and sister. Allergies  Allergen Reactions  . Ace Inhibitors Anaphylaxis    REACTION: cough REACTION: cough REACTION: cough   Current Outpatient Medications on File Prior to Visit  Medication Sig Dispense Refill  . ASPIRIN ADULT LOW STRENGTH 81 MG EC tablet TAKE ONE TABLET BY MOUTH ONCE DAILY 90 tablet 2  . Coenzyme Q10 (COQ-10) 200 MG CAPS Take 200 mg by mouth daily.     Marland Kitchen losartan-hydrochlorothiazide (HYZAAR) 100-25 MG tablet  TAKE ONE TABLET BY MOUTH ONCE DAILY 90 tablet 2  . metoprolol succinate (TOPROL-XL) 25 MG 24 hr tablet TAKE ONE TABLET BY MOUTH ONCE DAILY 90 tablet 2  . omeprazole (PRILOSEC) 20 MG capsule TAKE ONE CAPSULE BY MOUTH ONCE DAILY 90 capsule 2  . psyllium (METAMUCIL) 58.6 % powder Take 2 caps by mouth every day    . rosuvastatin (CRESTOR) 20 MG tablet TAKE ONE TABLET BY MOUTH AT BEDTIME 90 tablet 2   No current facility-administered medications on  file prior to visit.    Review of Systems Constitutional: Negative for other unusual diaphoresis, sweats, appetite or weight changes HENT: Negative for other worsening hearing loss, ear pain, facial swelling, mouth sores or neck stiffness.   Eyes: Negative for other worsening pain, redness or other visual disturbance.  Respiratory: Negative for other stridor or swelling Cardiovascular: Negative for other palpitations or other chest pain  Gastrointestinal: Negative for worsening diarrhea or loose stools, blood in stool, distention or other pain Genitourinary: Negative for hematuria, flank pain or other change in urine volume.  Musculoskeletal: Negative for myalgias or other joint swelling.  Skin: Negative for other color change, or other wound or worsening drainage.  Neurological: Negative for other syncope or numbness. Hematological: Negative for other adenopathy or swelling Psychiatric/Behavioral: Negative for hallucinations, other worsening agitation, SI, self-injury, or new decreased concentration All other system neg per pt    Objective:   Physical Exam BP 102/68   Pulse 68   Temp 97.6 F (36.4 C) (Oral)   Ht 6' 1"  (1.854 m)   Wt 198 lb (89.8 kg)   SpO2 97%   BMI 26.12 kg/m  VS noted,  Constitutional: Pt is oriented to person, place, and time. Appears well-developed and well-nourished, in no significant distress and comfortable Head: Normocephalic and atraumatic  Eyes: Conjunctivae and EOM are normal. Pupils are equal, round, and reactive to light Right Ear: External ear normal without discharge Left Ear: External ear normal without discharge Nose: Nose without discharge or deformity Mouth/Throat: Oropharynx is without other ulcerations and moist  Neck: Normal range of motion. Neck supple. No JVD present. No tracheal deviation present or significant neck LA or mass Cardiovascular: Normal rate, regular rhythm, normal heart sounds and intact distal pulses.   Pulmonary/Chest:  WOB normal and breath sounds without rales or wheezing  Abdominal: Soft. Bowel sounds are normal. NT. No HSM  Musculoskeletal: Normal range of motion. Exhibits no edema Lymphadenopathy: Has no other cervical adenopathy.  Neurological: Pt is alert and oriented to person, place, and time. Pt has normal reflexes. No cranial nerve deficit. Motor grossly intact, Gait intact Skin: Skin is warm and dry. No rash noted or new ulcerations, does have 1/2 cm x 1/4 cm slightly raised purplish lesion Psychiatric:  Has normal mood and affect. Behavior is normal without agitation No other exam findings  Lab Results  Component Value Date   WBC 6.4 05/11/2017   HGB 15.6 05/11/2017   HCT 46.3 05/11/2017   PLT 162.0 05/11/2017   GLUCOSE 118 (H) 05/11/2017   CHOL 137 05/11/2017   TRIG 120.0 05/11/2017   HDL 39.60 05/11/2017   LDLDIRECT 189.9 10/02/2007   LDLCALC 73 05/11/2017   ALT 21 05/11/2017   AST 18 05/11/2017   NA 136 05/11/2017   K 4.6 05/11/2017   CL 102 05/11/2017   CREATININE 1.37 05/11/2017   BUN 23 05/11/2017   CO2 27 05/11/2017   TSH 4.40 05/11/2017   PSA 0.65 05/18/2012   HGBA1C  6.5 05/11/2017   MICROALBUR 3.0 (H) 05/11/2017        Assessment & Plan:

## 2017-05-18 NOTE — Assessment & Plan Note (Signed)
Lab Results  Component Value Date   HGBA1C 6.5 05/11/2017  stable overall by history and exam, recent data reviewed with pt, and pt to continue medical treatment as before,  to f/u any worsening symptoms or concerns

## 2017-06-02 ENCOUNTER — Other Ambulatory Visit: Payer: Self-pay | Admitting: Internal Medicine

## 2017-07-06 ENCOUNTER — Encounter: Payer: Self-pay | Admitting: Internal Medicine

## 2017-07-06 ENCOUNTER — Encounter: Payer: Self-pay | Admitting: *Deleted

## 2017-07-06 ENCOUNTER — Ambulatory Visit: Payer: Medicare Other | Admitting: Internal Medicine

## 2017-07-06 VITALS — BP 138/62 | HR 64 | Ht 73.0 in | Wt 198.0 lb

## 2017-07-06 DIAGNOSIS — Z950 Presence of cardiac pacemaker: Secondary | ICD-10-CM | POA: Diagnosis not present

## 2017-07-06 DIAGNOSIS — I441 Atrioventricular block, second degree: Secondary | ICD-10-CM | POA: Diagnosis not present

## 2017-07-06 DIAGNOSIS — L7211 Pilar cyst: Secondary | ICD-10-CM | POA: Diagnosis not present

## 2017-07-06 DIAGNOSIS — D485 Neoplasm of uncertain behavior of skin: Secondary | ICD-10-CM | POA: Diagnosis not present

## 2017-07-06 DIAGNOSIS — C490 Malignant neoplasm of connective and soft tissue of head, face and neck: Secondary | ICD-10-CM | POA: Diagnosis not present

## 2017-07-06 DIAGNOSIS — Z23 Encounter for immunization: Secondary | ICD-10-CM | POA: Diagnosis not present

## 2017-07-06 LAB — CUP PACEART INCLINIC DEVICE CHECK
Battery Remaining Longevity: 107 mo
Brady Statistic AP VP Percent: 37 %
Brady Statistic AS VP Percent: 61 %
Brady Statistic AS VS Percent: 1 %
Date Time Interrogation Session: 20190207143458
Implantable Lead Implant Date: 20150212
Implantable Lead Location: 753859
Implantable Lead Location: 753860
Implantable Pulse Generator Implant Date: 20150212
Lead Channel Impedance Value: 593 Ohm
Lead Channel Pacing Threshold Amplitude: 0.75 V
Lead Channel Pacing Threshold Amplitude: 0.75 V
Lead Channel Pacing Threshold Amplitude: 0.75 V
Lead Channel Pacing Threshold Pulse Width: 0.4 ms
Lead Channel Pacing Threshold Pulse Width: 0.4 ms
Lead Channel Setting Pacing Amplitude: 2 V
Lead Channel Setting Pacing Amplitude: 2.5 V
Lead Channel Setting Pacing Pulse Width: 0.4 ms
Lead Channel Setting Sensing Sensitivity: 4 mV
MDC IDC LEAD IMPLANT DT: 20150212
MDC IDC MSMT BATTERY IMPEDANCE: 254 Ohm
MDC IDC MSMT BATTERY VOLTAGE: 2.77 V
MDC IDC MSMT LEADCHNL RA IMPEDANCE VALUE: 470 Ohm
MDC IDC MSMT LEADCHNL RA PACING THRESHOLD AMPLITUDE: 0.875 V
MDC IDC MSMT LEADCHNL RA PACING THRESHOLD PULSEWIDTH: 0.4 ms
MDC IDC MSMT LEADCHNL RA SENSING INTR AMPL: 4 mV
MDC IDC MSMT LEADCHNL RV PACING THRESHOLD PULSEWIDTH: 0.4 ms
MDC IDC STAT BRADY AP VS PERCENT: 1 %

## 2017-07-06 NOTE — Progress Notes (Signed)
HPI Daniel Woodard returns today for followup of his DDD PM. He is a pleasant 84 yo man with CHB, s/p DDD PM insertion. In the interim, he has done well. No chest pain or sob. No edema. No limitation to activity. Allergies  Allergen Reactions  . Ace Inhibitors Anaphylaxis    REACTION: cough REACTION: cough REACTION: cough     Current Outpatient Medications  Medication Sig Dispense Refill  . ASPIRIN ADULT LOW STRENGTH 81 MG EC tablet TAKE ONE TABLET BY MOUTH ONCE DAILY 90 tablet 2  . Coenzyme Q10 (COQ-10) 200 MG CAPS Take 200 mg by mouth daily.     Marland Kitchen losartan-hydrochlorothiazide (HYZAAR) 100-25 MG tablet TAKE 1 TABLET BY MOUTH ONCE DAILY 90 tablet 3  . metoprolol succinate (TOPROL-XL) 25 MG 24 hr tablet TAKE 1 TABLET BY MOUTH ONCE DAILY 90 tablet 3  . omeprazole (PRILOSEC) 20 MG capsule TAKE ONE CAPSULE BY MOUTH ONCE DAILY 90 capsule 2  . psyllium (METAMUCIL) 58.6 % powder Take 2 caps by mouth every day    . rosuvastatin (CRESTOR) 20 MG tablet TAKE ONE TABLET BY MOUTH AT BEDTIME 90 tablet 2   No current facility-administered medications for this visit.      Past Medical History:  Diagnosis Date  . ALLERGIC RHINITIS 04/09/2007  . Atrial fibrillation ([place])   . BACTEREMIA, 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  . BENIGN PROSTATIC HYPERTROPHY 04/09/2007  . Blood transfusion without reported diagnosis    really not sure thinks 30-40 years ago  . BRADYCARDIA 05/11/2009  . Carotid artery occlusion   . COLONIC POLYPS, HX OF 04/09/2007   ADENOMATOUS POLYPS 0347,4259  . Complete heart block ([place])   . COPD 04/09/2007  . Cough 03/27/2008  . DEPRESSION 04/09/2007  . DIABETES MELLITUS, TYPE II 10/09/2007  . DIVERTICULOSIS, COLON 04/09/2007  . DIZZINESS 10/06/2008  . GLUCOSE INTOLERANCE 04/09/2007  . HYPERLIPIDEMIA 03/27/2007  . HYPERTENSION 03/27/2007  . PERIPHERAL VASCULAR DISEASE 03/27/2007  . PNEUMONIA ORGANISM NOS 11/08/2007  . SHINGLES, HX OF 04/09/2007  . SHOULDER PAIN,  BILATERAL 08/01/2007  . Ulcer    40 years ago    ROS:   All systems reviewed and negative except as noted in the HPI.   Past Surgical History:  Procedure Laterality Date  . APPENDECTOMY     84 years old  . CAROTID ENDARTERECTOMY  5/08   left  . CARPAL TUNNEL RELEASE     bilateral  . COLONOSCOPY    . ESOPHAGOGASTRODUODENOSCOPY  06/07/2004  . HEMORRHOID BANDING  1990s  . PACEMAKER INSERTION  07/11/2013   MDT ADDRL1 pacemaker implanted by Dr Lovena Le for complete heart block  . PERMANENT PACEMAKER INSERTION N/A 07/11/2013   Procedure: PERMANENT PACEMAKER INSERTION;  Surgeon: Evans Lance, MD;  Location: [facility] CATH LAB;  Service: Cardiovascular;  Laterality: N/A;  . s/p PUD surgury     ? partial gastrectomy     Family History  Problem Relation Age of Onset  . Hypertension Father   . Ulcers Father   . Hypertension Mother   . Hypertension Sister   . Peripheral vascular disease Brother   . Hypertension Sister   . Ulcers Sister        Ulcers  . Hypertension Sister   . Lung cancer Sister        smoked  . Heart disease Sister        Carotid   . Hypertension Sister   . Heart disease Other  Cardiovascular disorder, CHF  . Stroke Other        1st degree relative male and male  . Colon cancer Neg Hx      Social History   Socioeconomic History  . Marital status: Married    Spouse name: Not on file  . Number of children: 2  . Years of education: Not on file  . Highest education level: Not on file  Social Needs  . Financial resource strain: Not on file  . Food insecurity - worry: Not on file  . Food insecurity - inability: Not on file  . Transportation needs - medical: Not on file  . Transportation needs - non-medical: Not on file  Occupational History  . 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 Smoking status: Former Smoker    Packs/day: 1.00    Years: 50.00    Pack years: 50.00    Types: Cigarettes    Last  attempt to quit: 08/20/1999    Years since quitting: 17.8  . Smokeless tobacco: Never Used  Substance and Sexual Activity  . Alcohol use: No    Alcohol/week: 0.0 oz  . Drug use: No  . Sexual activity: Not on file  Other Topics Concern  . Not on file  Social History Narrative   Daily caffeine      BP 138/62   Pulse 64   Ht _0  (1.854 m)   Wt 198 lb (89.8 kg)   BMI 26.12 kg/m   Physical Exam:  Well appearing 84 yo man, NAD HEENT: Unremarkable Neck:  No JVD, no thyromegally Lymphatics:  No adenopathy Back:  No CVA tenderness Lungs:  Clear with no wheezes HEART:  Regular rate rhythm, no murmurs, no rubs, no clicks Abd:  soft, positive bowel sounds, no organomegally, no rebound, no guarding Ext:  2 plus pulses, no edema, no cyanosis, no clubbing Skin:  No rashes no nodules Neuro:  CN II through XII intact, motor grossly intact  EKG - NSR with ventricular pacing  DEVICE  Normal device function.  See PaceArt for details.   Assess/Plan: 1. CHB - he is asymptomatic, s/p DDD PM insertion 2. PPM - his medtronic DDD PM is working normally. He will recheck in several months.  3. HTN - his blood pressure is a little increased. He is encouraged to take his med and maintain a low sodium diet.  Daniel Woodard.D.

## 2017-07-06 NOTE — Patient Instructions (Signed)

## 2017-07-12 ENCOUNTER — Ambulatory Visit (INDEPENDENT_AMBULATORY_CARE_PROVIDER_SITE_OTHER): Payer: Medicare Other | Admitting: *Deleted

## 2017-07-12 DIAGNOSIS — I441 Atrioventricular block, second degree: Secondary | ICD-10-CM | POA: Diagnosis not present

## 2017-07-12 NOTE — Progress Notes (Signed)
Remote pacemaker transmission.   

## 2017-07-13 ENCOUNTER — Encounter: Payer: Self-pay | Admitting: Cardiology

## 2017-07-22 LAB — CUP PACEART REMOTE DEVICE CHECK
Brady Statistic AP VS Percent: 0 %
Brady Statistic AS VP Percent: 58 %
Brady Statistic AS VS Percent: 0 %
Date Time Interrogation Session: 20190213133228
Implantable Lead Implant Date: 20150212
Implantable Lead Location: 753860
Lead Channel Impedance Value: 470 Ohm
Lead Channel Impedance Value: 615 Ohm
Lead Channel Pacing Threshold Amplitude: 0.75 V
Lead Channel Pacing Threshold Amplitude: 0.75 V
Lead Channel Pacing Threshold Pulse Width: 0.4 ms
Lead Channel Pacing Threshold Pulse Width: 0.4 ms
Lead Channel Setting Pacing Pulse Width: 0.4 ms
MDC IDC LEAD IMPLANT DT: 20150212
MDC IDC LEAD LOCATION: 753859
MDC IDC MSMT BATTERY IMPEDANCE: 278 Ohm
MDC IDC MSMT BATTERY REMAINING LONGEVITY: 103 mo
MDC IDC MSMT BATTERY VOLTAGE: 2.78 V
MDC IDC PG IMPLANT DT: 20150212
MDC IDC SET LEADCHNL RA PACING AMPLITUDE: 2 V
MDC IDC SET LEADCHNL RV PACING AMPLITUDE: 2.5 V
MDC IDC SET LEADCHNL RV SENSING SENSITIVITY: 4 mV
MDC IDC STAT BRADY AP VP PERCENT: 42 %

## 2017-07-28 DIAGNOSIS — H43821 Vitreomacular adhesion, right eye: Secondary | ICD-10-CM | POA: Diagnosis not present

## 2017-07-28 DIAGNOSIS — H3554 Dystrophies primarily involving the retinal pigment epithelium: Secondary | ICD-10-CM | POA: Diagnosis not present

## 2017-07-28 DIAGNOSIS — H35053 Retinal neovascularization, unspecified, bilateral: Secondary | ICD-10-CM | POA: Diagnosis not present

## 2017-07-28 DIAGNOSIS — E119 Type 2 diabetes mellitus without complications: Secondary | ICD-10-CM | POA: Diagnosis not present

## 2017-08-22 DIAGNOSIS — C4449 Other specified malignant neoplasm of skin of scalp and neck: Secondary | ICD-10-CM | POA: Diagnosis not present

## 2017-10-11 ENCOUNTER — Ambulatory Visit (INDEPENDENT_AMBULATORY_CARE_PROVIDER_SITE_OTHER): Payer: Medicare Other | Admitting: *Deleted

## 2017-10-11 DIAGNOSIS — I441 Atrioventricular block, second degree: Secondary | ICD-10-CM | POA: Diagnosis not present

## 2017-10-12 ENCOUNTER — Ambulatory Visit: Payer: Medicare Other | Admitting: Family

## 2017-10-12 ENCOUNTER — Encounter (HOSPITAL_COMMUNITY): Payer: Medicare Other

## 2017-10-12 LAB — CUP PACEART REMOTE DEVICE CHECK
Battery Remaining Longevity: 101 mo
Brady Statistic AP VS Percent: 0 %
Brady Statistic AS VS Percent: 0 %
Implantable Lead Implant Date: 20150212
Implantable Lead Location: 753860
Lead Channel Impedance Value: 601 Ohm
Lead Channel Pacing Threshold Amplitude: 0.75 V
Lead Channel Pacing Threshold Pulse Width: 0.4 ms
Lead Channel Pacing Threshold Pulse Width: 0.4 ms
Lead Channel Setting Pacing Amplitude: 2 V
Lead Channel Setting Pacing Pulse Width: 0.4 ms
Lead Channel Setting Sensing Sensitivity: 4 mV
MDC IDC LEAD IMPLANT DT: 20150212
MDC IDC LEAD LOCATION: 753859
MDC IDC MSMT BATTERY IMPEDANCE: 302 Ohm
MDC IDC MSMT BATTERY VOLTAGE: 2.77 V
MDC IDC MSMT LEADCHNL RA IMPEDANCE VALUE: 475 Ohm
MDC IDC MSMT LEADCHNL RA PACING THRESHOLD AMPLITUDE: 0.625 V
MDC IDC MSMT LEADCHNL RA SENSING INTR AMPL: 2 mV
MDC IDC PG IMPLANT DT: 20150212
MDC IDC SESS DTM: 20190515135834
MDC IDC SET LEADCHNL RV PACING AMPLITUDE: 2.5 V
MDC IDC STAT BRADY AP VP PERCENT: 34 %
MDC IDC STAT BRADY AS VP PERCENT: 65 %

## 2017-10-12 NOTE — Progress Notes (Signed)
Remote pacemaker transmission.   

## 2017-10-13 ENCOUNTER — Encounter: Payer: Self-pay | Admitting: Cardiology

## 2017-10-16 ENCOUNTER — Other Ambulatory Visit: Payer: Self-pay | Admitting: Internal Medicine

## 2017-10-19 ENCOUNTER — Ambulatory Visit: Payer: Medicare Other | Admitting: Family

## 2017-10-19 ENCOUNTER — Ambulatory Visit (HOSPITAL_COMMUNITY)
Admission: RE | Admit: 2017-10-19 | Discharge: 2017-10-19 | Disposition: A | Discharge status: Home / Self Care | Payer: Medicare Other | Source: Ambulatory Visit | Attending: Family | Admitting: Family

## 2017-10-19 ENCOUNTER — Other Ambulatory Visit: Payer: Self-pay

## 2017-10-19 ENCOUNTER — Encounter: Payer: Self-pay | Admitting: Family

## 2017-10-19 VITALS — BP 151/74 | HR 66 | Temp 97.2°F | Resp 18 | Ht 73.0 in | Wt 195.0 lb

## 2017-10-19 DIAGNOSIS — I6522 Occlusion and stenosis of left carotid artery: Secondary | ICD-10-CM | POA: Diagnosis not present

## 2017-10-19 DIAGNOSIS — Z9889 Other specified postprocedural states: Secondary | ICD-10-CM | POA: Diagnosis not present

## 2017-10-19 DIAGNOSIS — I771 Stricture of artery: Secondary | ICD-10-CM | POA: Diagnosis not present

## 2017-10-19 DIAGNOSIS — Z09 Encounter for follow-up examination after completed treatment for conditions other than malignant neoplasm: Secondary | ICD-10-CM | POA: Insufficient documentation

## 2017-10-19 DIAGNOSIS — I10 Essential (primary) hypertension: Secondary | ICD-10-CM | POA: Diagnosis not present

## 2017-10-19 DIAGNOSIS — Z87891 Personal history of nicotine dependence: Secondary | ICD-10-CM | POA: Diagnosis not present

## 2017-10-19 NOTE — Progress Notes (Signed)
Chief Complaint: Follow up Extracranial Carotid Artery Stenosis   History of Present Illness  Daniel Woodard is a 84 y.o. male patient of Dr. Oneida Alar who presents for follow-up evaluation for carotid stenosis. He previously had a left carotid endarterectomy by Dr. Amedeo Plenty in February of 2001.   Patient has no history of TIA or stroke symptom; specifically the patient denies: amaurosis fugax or monocular blindness, unilateral facial drooping, hemiplegia, or receptive or expressive aphasia.  He has a pacemaker inserted in February. He denies steal symptoms in either hand/arm, denies dizziness when reaching overhead.   Pt denies claudication sx's with walking. He walks 20 minutes twice daily.  Pt Diabetic: No, he states he is not, but + in his PMHX Pt smoker: former smoker, quit in 2001  Pt meds include:  Statin : Yes  ASA: Yes  Other anticoagulants/antiplatelets: no   Past Medical History:  Diagnosis Date  . ALLERGIC RHINITIS 04/09/2007  . Atrial fibrillation ([place])   . BACTEREMIA, 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  . BENIGN PROSTATIC HYPERTROPHY 04/09/2007  . Blood transfusion without reported diagnosis    really not sure thinks 30-40 years ago  . BRADYCARDIA 05/11/2009  . Carotid artery occlusion   . COLONIC POLYPS, HX OF 04/09/2007   ADENOMATOUS POLYPS 5284,1324  . Complete heart block ([place])   . COPD 04/09/2007  . Cough 03/27/2008  . DEPRESSION 04/09/2007  . DIABETES MELLITUS, TYPE II 10/09/2007  . DIVERTICULOSIS, COLON 04/09/2007  . DIZZINESS 10/06/2008  . GLUCOSE INTOLERANCE 04/09/2007  . HYPERLIPIDEMIA 03/27/2007  . HYPERTENSION 03/27/2007  . PERIPHERAL VASCULAR DISEASE 03/27/2007  . PNEUMONIA ORGANISM NOS 11/08/2007  . SHINGLES, HX OF 04/09/2007  . SHOULDER PAIN, BILATERAL 08/01/2007  . Ulcer    40 years ago    Social History Social History   Tobacco Use  . Smoking status: Former Smoker    Packs/day: 1.00    Years: 50.00    Pack years: 50.00     Types: Cigarettes    Last attempt to quit: 08/20/1999    Years since quitting: 18.1  . Smokeless tobacco: Never Used  Substance Use Topics  . Alcohol use: No    Alcohol/week: 0.0 oz  . Drug use: No    Family History Family History  Problem Relation Age of Onset  . Hypertension Father   . Ulcers Father   . Hypertension Mother   . Hypertension Sister   . Peripheral vascular disease Brother   . Hypertension Sister   . Ulcers Sister        Ulcers  . Hypertension Sister   . Lung cancer Sister        smoked  . Heart disease Sister        Carotid   . Hypertension Sister   . Heart disease Other        Cardiovascular disorder, CHF  . Stroke Other        1st degree relative male and male  . Colon cancer Neg Hx     Surgical History Past Surgical History:  Procedure Laterality Date  . APPENDECTOMY     84 years old  . CAROTID ENDARTERECTOMY  5/08   left  . CARPAL TUNNEL RELEASE     bilateral  . COLONOSCOPY    . ESOPHAGOGASTRODUODENOSCOPY  06/07/2004  . HEMORRHOID BANDING  1990s  . PACEMAKER INSERTION  07/11/2013   MDT ADDRL1 pacemaker implanted by Dr Lovena Le for complete heart block  . PERMANENT PACEMAKER INSERTION N/A 07/11/2013   Procedure:  PERMANENT PACEMAKER INSERTION;  Surgeon: Evans Lance, MD;  Location[facility] C[facility] LAB;  Service: Cardiovascular;  Laterality: N/A;  . s/p PUD surgury     ? partial gastrectomy    Allergies  Allergen Reactions  . Ace Inhibitors Anaphylaxis    REACTION: cough REACTION: cough REACTION: cough REACTION: cough    Current Outpatient Medications  Medication Sig Dispense Refill  . ASPIRIN ADULT LOW STRENGTH 81 MG EC tablet TAKE ONE TABLET BY MOUTH ONCE DAILY 90 tablet 2  . Coenzyme Q10 (COQ-10) 200 MG CAPS Take 200 mg by mouth daily.     Marland Kitchen losartan-hydrochlorothiazide (HYZAAR) 100-25 MG tablet TAKE 1 TABLET BY MOUTH ONCE DAILY 90 tablet 3  . metoprolol succinate (TOPROL-XL) 25 MG 24 hr tablet TAKE 1 TABLET BY MOUTH ONCE DAILY 90 tablet 3   . omeprazole (PRILOSEC) 20 MG capsule TAKE 1 CAPSULE BY MOUTH ONCE DAILY 90 capsule 1  . psyllium (METAMUCIL) 58.6 % powder Take 2 caps by mouth every day    . rosuvastatin (CRESTOR) 20 MG tablet TAKE ONE TABLET BY MOUTH AT BEDTIME 90 tablet 2   No current facility-administered medications for this visit.     Review of Systems : See HPI for pertinent positives and negatives.  Physical Examination  Vitals:   10/19/17 0844 10/19/17 0850 10/19/17 0851  BP: 107/73 (!) 168/72 (!) 151/74  Pulse: 66 66 66  Resp: 18    Temp: (!) 97.2 F (36.2 C)    TempSrc: Oral    SpO2: 95%    Weight: 195 lb (88.5 kg)    Height: _0  (1.854 m)     Body mass index is 25.73 kg/m.  General: WDWN male in NAD, appears younger than stated age.  GAIT:normal HENT: no gross abnormalities  Eyes: PERRLA Pulmonary: Respirations are non-labored, adequate air movement in all fields, no rales,rhonchi, or wheezes. Cardiac: Regular rhythm and rate, no detected murmur. Pacemaker palpated subcutaneously in left upper chest.  VASCULAR EXAM Carotid Bruits Left Right   Negative Negative   Aorta is not palpable. Radial pulses: right is 3+, left is 2+ palpable.  Femoral pulses: right is 1+ palpable, left is 2+ palpable. Popliteal pulses are not palpable. DP pulses are faintly palpable, PT pulses are not palpable. No signs of ischemia in feet, no open wounds.   Gastrointestinal: soft, nontender, BS WNL, no r/g,no palpable masses. Musculoskeletal: No muscle atrophy/wasting. M/S 5/5 throughout, Extremities without ischemic changes. Skin: No rashes, no ulcers, no cellulitis.   Neurologic:  A&O X 3; appropriate affect, sensation is normal; speech is normal, CN 2-12 intact, pain and light touch intact in extremities, motor exam as listed above. Psychiatric: Normal thought  content, mood appropriate to clinical situation.    Assessment: Daniel Woodard is a 84 y.o. male who is s/p left carotid endarterectomy in February of 2001. He has no history of stroke or TIA.  He denies steal symptoms in either hand/arm, denies dizziness when reaching overhead. 44 mm Hg pressure gradient difference in brachial pressures.   DATA Carotid Duplex (10/19/17): Right ICA: 1-39% stenosis Left ICA: CEA site with 1-39% stenosis Right vertebral artery is antegrade; left is retrogradel, right subclavian artery waveforms are normal; left are monophasic. No significant change since the exam on 10-06-16.   Plan: Follow-up in 2 years with Carotid Duplex scan.  I discussed in depth with the patient the nature of atherosclerosis, and emphasized the importance of maximal medical management including strict control of blood pressure, blood glucose,  and lipid levels, obtaining regular exercise, and continued cessation of smoking.  The patient is aware that without maximal medical management the underlying atherosclerotic disease process will progress, limiting the benefit of any interventions. The patient was given information about stroke prevention and what symptoms should prompt the patient to seek immediate medical care. Thank you for allowing [place] to participate in this patient's care.  Clemon Chambers, RN, MSN, FNP-C [facility] Office: [phone]  Clinic Physician: Scot Dock  10/19/17 8:54 AM

## 2017-10-19 NOTE — Progress Notes (Signed)
Vitals:   10/19/17 0844 10/19/17 0850  BP: 107/73 (!) 168/72  Pulse: 66 66  Resp: 18   Temp: (!) 97.2 F (36.2 C)   TempSrc: Oral   SpO2: 95%   Weight: 195 lb (88.5 kg)   Height: 6\' 1"  (1.854 m)

## 2017-10-19 NOTE — Patient Instructions (Signed)

## 2017-11-10 ENCOUNTER — Other Ambulatory Visit (INDEPENDENT_AMBULATORY_CARE_PROVIDER_SITE_OTHER): Payer: Medicare Other

## 2017-11-10 DIAGNOSIS — E119 Type 2 diabetes mellitus without complications: Secondary | ICD-10-CM | POA: Diagnosis not present

## 2017-11-10 LAB — BASIC METABOLIC PANEL
BUN: 32 mg/dL — ABNORMAL HIGH (ref 6–23)
CHLORIDE: 102 meq/L (ref 96–112)
CO2: 26 meq/L (ref 19–32)
Calcium: 9.4 mg/dL (ref 8.4–10.5)
Creatinine, Ser: 1.61 mg/dL — ABNORMAL HIGH (ref 0.40–1.50)
GFR: 43.49 mL/min — ABNORMAL LOW (ref >60.00)
Glucose, Bld: 126 mg/dL — ABNORMAL HIGH (ref 70–99)
POTASSIUM: 4.3 meq/L (ref 3.5–5.1)
Sodium: 137 mEq/L (ref 135–145)

## 2017-11-10 LAB — LIPID PANEL
CHOL/HDL RATIO: 4
CHOLESTEROL: 153 mg/dL (ref 0–200)
HDL: 34.9 mg/dL — ABNORMAL LOW (ref >39.00)
LDL CALC: 88 mg/dL (ref 0–99)
NonHDL: 117.75
TRIGLYCERIDES: 151 mg/dL — AB (ref 0.0–149.0)
VLDL: 30.2 mg/dL (ref 0.0–40.0)

## 2017-11-10 LAB — HEPATIC FUNCTION PANEL
ALT: 16 U/L (ref 0–53)
AST: 17 U/L (ref 0–37)
Albumin: 4.3 g/dL (ref 3.5–5.2)
Alkaline Phosphatase: 70 U/L (ref 39–117)
BILIRUBIN TOTAL: 0.6 mg/dL (ref 0.2–1.2)
Bilirubin, Direct: 0.1 mg/dL (ref 0.0–0.3)
Total Protein: 7.8 g/dL (ref 6.0–8.3)

## 2017-11-10 LAB — HEMOGLOBIN A1C: Hgb A1c MFr Bld: 6.6 % — ABNORMAL HIGH (ref 4.6–6.5)

## 2017-11-16 ENCOUNTER — Ambulatory Visit: Payer: Medicare Other | Admitting: Internal Medicine

## 2017-11-16 ENCOUNTER — Encounter: Payer: Self-pay | Admitting: Internal Medicine

## 2017-11-16 VITALS — BP 116/74 | HR 82 | Temp 98.0°F | Ht 73.0 in | Wt 202.0 lb

## 2017-11-16 DIAGNOSIS — N289 Disorder of kidney and ureter, unspecified: Secondary | ICD-10-CM

## 2017-11-16 DIAGNOSIS — I10 Essential (primary) hypertension: Secondary | ICD-10-CM | POA: Diagnosis not present

## 2017-11-16 DIAGNOSIS — E119 Type 2 diabetes mellitus without complications: Secondary | ICD-10-CM | POA: Diagnosis not present

## 2017-11-16 NOTE — Patient Instructions (Signed)
Please continue all other medications as before, and refills have been done if requested.  Please have the pharmacy call with any other refills you may need.  Please continue your efforts at being more active, low cholesterol diet, and weight control.  You are otherwise up to date with prevention measures today.  Please keep your appointments with your specialists as you may have planned  Please return in 6 months, or sooner if needed, with Lab testing done 3-5 days before  

## 2017-11-16 NOTE — Assessment & Plan Note (Signed)
stable overall by history and exam, recent data reviewed with pt, and pt to continue medical treatment as before,  to f/u any worsening symptoms or concerns BP Readings from Last 3 Encounters:  11/16/17 116/74  10/19/17 (!) 151/74  07/06/17 138/62

## 2017-11-16 NOTE — Progress Notes (Signed)
Subjective:    Patient ID: Daniel Woodard, male    DOB: [birth year], 84 y.o.   MRN: [id number]  HPI  Here to f/u; overall doing ok,  Pt denies chest pain, increasing sob or doe, wheezing, orthopnea, PND, increased LE swelling, palpitations, dizziness or syncope.  Pt denies new neurological symptoms such as new headache, or facial or extremity weakness or numbness.  Pt denies polydipsia, polyuria, or low sugar episode.  Pt states overall good compliance with meds, mostly trying to follow appropriate diet, with wt overall stable,  but little exercise however, has gained a few lbs.  Plans to do better with diet Wt Readings from Last 3 Encounters:  11/16/17 202 lb (91.6 kg)  10/19/17 195 lb (88.5 kg)  07/06/17 198 lb (89.8 kg)   Past Medical History:  Diagnosis Date  . ALLERGIC RHINITIS 04/09/2007  . Atrial fibrillation ([place])   . BACTEREMIA, 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  . BENIGN PROSTATIC HYPERTROPHY 04/09/2007  . Blood transfusion without reported diagnosis    really not sure thinks 30-40 years ago  . BRADYCARDIA 05/11/2009  . Carotid artery occlusion   . COLONIC POLYPS, HX OF 04/09/2007   ADENOMATOUS POLYPS 0712,1975  . Complete heart block ([place])   . COPD 04/09/2007  . Cough 03/27/2008  . DEPRESSION 04/09/2007  . DIABETES MELLITUS, TYPE II 10/09/2007  . DIVERTICULOSIS, COLON 04/09/2007  . DIZZINESS 10/06/2008  . GLUCOSE INTOLERANCE 04/09/2007  . HYPERLIPIDEMIA 03/27/2007  . HYPERTENSION 03/27/2007  . PERIPHERAL VASCULAR DISEASE 03/27/2007  . PNEUMONIA ORGANISM NOS 11/08/2007  . SHINGLES, HX OF 04/09/2007  . SHOULDER PAIN, BILATERAL 08/01/2007  . Ulcer    40 years ago   Past Surgical History:  Procedure Laterality Date  . APPENDECTOMY     84 years old  . CAROTID ENDARTERECTOMY  5/08   left  . CARPAL TUNNEL RELEASE     bilateral  . COLONOSCOPY    . ESOPHAGOGASTRODUODENOSCOPY  06/07/2004  . HEMORRHOID BANDING  1990s  . PACEMAKER INSERTION  07/11/2013   MDT ADDRL1  pacemaker implanted by Dr Lovena Le for complete heart block  . PERMANENT PACEMAKER INSERTION N/A 07/11/2013   Procedure: PERMANENT PACEMAKER INSERTION;  Surgeon: Evans Lance, MD;  Location: [facility] CATH LAB;  Service: Cardiovascular;  Laterality: N/A;  . s/p PUD surgury     ? partial gastrectomy    reports that he quit smoking about 18 years ago. His smoking use included cigarettes. He has a 50.00 pack-year smoking history. He has never used smokeless tobacco. He reports that he does not drink alcohol or use drugs. family history includes Heart disease in his other and sister; Hypertension in his father, mother, sister, sister, sister, and sister; Lung cancer in his sister; Peripheral vascular disease in his brother; Stroke in his other; Ulcers in his father and sister. Allergies  Allergen Reactions  . Ace Inhibitors Anaphylaxis    REACTION: cough REACTION: cough REACTION: cough REACTION: cough   Review of Systems  Constitutional: Negative for other unusual diaphoresis or sweats HENT: Negative for ear discharge or swelling Eyes: Negative for other worsening visual disturbances Respiratory: Negative for stridor or other swelling  Gastrointestinal: Negative for worsening distension or other blood Genitourinary: Negative for retention or other urinary change Musculoskeletal: Negative for other MSK pain or swelling Skin: Negative for color change or other new lesions Neurological: Negative for worsening tremors and other numbness  Psychiatric/Behavioral: Negative for worsening agitation or other fatigue All other system neg per pt  Objective:   Physical Exam BP 116/74   Pulse 82   Temp 98 F (36.7 C) (Oral)   Ht _0  (1.854 m)   Wt 202 lb (91.6 kg)   SpO2 94%   BMI 26.65 kg/m  VS noted,  Constitutional: Pt appears in NAD HENT: Head: NCAT.  Right Ear: External ear normal.  Left Ear: External ear normal.  Eyes: . Pupils are equal, round, and reactive to light. Conjunctivae and  EOM are normal Nose: without d/c or deformity Neck: Neck supple. Gross normal ROM Cardiovascular: Normal rate and regular rhythm.   Pulmonary/Chest: Effort normal and breath sounds without rales or wheezing.  Abd:  Soft, NT, ND, + BS, no organomegaly Neurological: Pt is alert. At baseline orientation, motor grossly intact Skin: Skin is warm. No rashes, other new lesions, no LE edema Psychiatric: Pt behavior is normal without agitation  No other exam findings Lab Results  Component Value Date   WBC 6.4 05/11/2017   HGB 15.6 05/11/2017   HCT 46.3 05/11/2017   PLT 162.0 05/11/2017   GLUCOSE 126 (H) 11/10/2017   CHOL 153 11/10/2017   TRIG 151.0 (H) 11/10/2017   HDL 34.90 (L) 11/10/2017   LDLDIRECT 189.9 10/02/2007   LDLCALC 88 11/10/2017   ALT 16 11/10/2017   AST 17 11/10/2017   NA 137 11/10/2017   K 4.3 11/10/2017   CL 102 11/10/2017   CREATININE 1.61 (H) 11/10/2017   BUN 32 (H) 11/10/2017   CO2 26 11/10/2017   TSH 4.40 05/11/2017   PSA 0.65 05/18/2012   HGBA1C 6.6 (H) 11/10/2017   MICROALBUR 3.0 (H) 05/11/2017       Assessment & Plan:

## 2017-11-16 NOTE — Assessment & Plan Note (Signed)
stable overall by history and exam, recent data reviewed with pt, and pt to continue medical treatment as before,  to f/u any worsening symptoms or concerns Lab Results  Component Value Date   HGBA1C 6.6 (H) 11/10/2017

## 2017-11-16 NOTE — Assessment & Plan Note (Signed)
Mild new slight increased creatinine, cont current meds for now, not orthostatic, f/u lab next visit, consider renal if persistent as would be ckd 3

## 2018-01-11 ENCOUNTER — Ambulatory Visit (INDEPENDENT_AMBULATORY_CARE_PROVIDER_SITE_OTHER): Payer: Medicare Other | Admitting: *Deleted

## 2018-01-11 DIAGNOSIS — I441 Atrioventricular block, second degree: Secondary | ICD-10-CM

## 2018-01-11 NOTE — Progress Notes (Signed)
Remote pacemaker transmission.   

## 2018-02-19 LAB — CUP PACEART REMOTE DEVICE CHECK
Battery Impedance: 327 Ohm
Battery Remaining Longevity: 98 mo
Brady Statistic AS VP Percent: 65 %
Brady Statistic AS VS Percent: 0 %
Date Time Interrogation Session: 20190814140401
Implantable Lead Implant Date: 20150212
Implantable Lead Implant Date: 20150212
Implantable Lead Location: 753859
Implantable Lead Model: 5076
Implantable Pulse Generator Implant Date: 20150212
Lead Channel Impedance Value: 464 Ohm
Lead Channel Impedance Value: 584 Ohm
Lead Channel Pacing Threshold Amplitude: 0.625 V
Lead Channel Setting Pacing Amplitude: 2 V
Lead Channel Setting Pacing Amplitude: 2.5 V
Lead Channel Setting Sensing Sensitivity: 4 mV
MDC IDC LEAD LOCATION: 753860
MDC IDC MSMT BATTERY VOLTAGE: 2.77 V
MDC IDC MSMT LEADCHNL RA PACING THRESHOLD AMPLITUDE: 0.625 V
MDC IDC MSMT LEADCHNL RA PACING THRESHOLD PULSEWIDTH: 0.4 ms
MDC IDC MSMT LEADCHNL RV PACING THRESHOLD PULSEWIDTH: 0.4 ms
MDC IDC SET LEADCHNL RV PACING PULSEWIDTH: 0.4 ms
MDC IDC STAT BRADY AP VP PERCENT: 34 %
MDC IDC STAT BRADY AP VS PERCENT: 0 %

## 2018-03-12 DIAGNOSIS — Z23 Encounter for immunization: Secondary | ICD-10-CM | POA: Diagnosis not present

## 2018-03-13 ENCOUNTER — Other Ambulatory Visit: Payer: Self-pay | Admitting: Internal Medicine

## 2018-03-19 DIAGNOSIS — L821 Other seborrheic keratosis: Secondary | ICD-10-CM | POA: Diagnosis not present

## 2018-03-19 DIAGNOSIS — Z23 Encounter for immunization: Secondary | ICD-10-CM | POA: Diagnosis not present

## 2018-03-19 DIAGNOSIS — D485 Neoplasm of uncertain behavior of skin: Secondary | ICD-10-CM | POA: Diagnosis not present

## 2018-03-19 DIAGNOSIS — L570 Actinic keratosis: Secondary | ICD-10-CM | POA: Diagnosis not present

## 2018-04-12 ENCOUNTER — Telehealth: Payer: Self-pay | Admitting: Cardiology

## 2018-04-12 ENCOUNTER — Ambulatory Visit (INDEPENDENT_AMBULATORY_CARE_PROVIDER_SITE_OTHER): Payer: Medicare Other | Admitting: *Deleted

## 2018-04-12 DIAGNOSIS — I441 Atrioventricular block, second degree: Secondary | ICD-10-CM

## 2018-04-12 NOTE — Progress Notes (Signed)
Remote pacemaker transmission.   

## 2018-04-12 NOTE — Telephone Encounter (Signed)
Spoke with pt and reminded pt of remote transmission that is due today. Pt verbalized understanding.   

## 2018-04-19 ENCOUNTER — Other Ambulatory Visit: Payer: Self-pay | Admitting: Internal Medicine

## 2018-05-10 ENCOUNTER — Other Ambulatory Visit (INDEPENDENT_AMBULATORY_CARE_PROVIDER_SITE_OTHER): Payer: Medicare Other

## 2018-05-10 DIAGNOSIS — E119 Type 2 diabetes mellitus without complications: Secondary | ICD-10-CM | POA: Diagnosis not present

## 2018-05-10 LAB — BASIC METABOLIC PANEL
BUN: 25 mg/dL — AB (ref 6–23)
CALCIUM: 9 mg/dL (ref 8.4–10.5)
CO2: 25 meq/L (ref 19–32)
CREATININE: 1.46 mg/dL (ref 0.40–1.50)
Chloride: 106 mEq/L (ref 96–112)
GFR: 48.63 mL/min — ABNORMAL LOW (ref >60.00)
Glucose, Bld: 128 mg/dL — ABNORMAL HIGH (ref 70–99)
Potassium: 4.5 mEq/L (ref 3.5–5.1)
Sodium: 139 mEq/L (ref 135–145)

## 2018-05-10 LAB — LIPID PANEL
Cholesterol: 139 mg/dL (ref 0–200)
HDL: 32.6 mg/dL — AB (ref >39.00)
LDL CALC: 73 mg/dL (ref 0–99)
NonHDL: 106.24
TRIGLYCERIDES: 166 mg/dL — AB (ref 0.0–149.0)
Total CHOL/HDL Ratio: 4
VLDL: 33.2 mg/dL (ref 0.0–40.0)

## 2018-05-10 LAB — URINALYSIS, ROUTINE W REFLEX MICROSCOPIC
BILIRUBIN URINE: NEGATIVE
HGB URINE DIPSTICK: NEGATIVE
Ketones, ur: NEGATIVE
Leukocytes, UA: NEGATIVE
NITRITE: NEGATIVE
RBC / HPF: NONE SEEN (ref 0–?)
SPECIFIC GRAVITY, URINE: 1.025 (ref 1.000–1.030)
TOTAL PROTEIN, URINE-UPE24: NEGATIVE
Urine Glucose: NEGATIVE
Urobilinogen, UA: 0.2 (ref 0.0–1.0)
pH: 5 (ref 5.0–8.0)

## 2018-05-10 LAB — HEPATIC FUNCTION PANEL
ALK PHOS: 63 U/L (ref 39–117)
ALT: 15 U/L (ref 0–53)
AST: 16 U/L (ref 0–37)
Albumin: 4.2 g/dL (ref 3.5–5.2)
Bilirubin, Direct: 0.1 mg/dL (ref 0.0–0.3)
Total Bilirubin: 0.7 mg/dL (ref 0.2–1.2)
Total Protein: 7.5 g/dL (ref 6.0–8.3)

## 2018-05-10 LAB — MICROALBUMIN / CREATININE URINE RATIO
Creatinine,U: 225.4 mg/dL
MICROALB/CREAT RATIO: 2 mg/g (ref 0.0–30.0)
Microalb, Ur: 4.6 mg/dL — ABNORMAL HIGH (ref 0.0–1.9)

## 2018-05-10 LAB — CBC WITH DIFFERENTIAL/PLATELET
Basophils Absolute: 0.1 10*3/uL (ref 0.0–0.1)
Basophils Relative: 1 % (ref 0.0–3.0)
EOS ABS: 0.3 10*3/uL (ref 0.0–0.7)
Eosinophils Relative: 5.7 % — ABNORMAL HIGH (ref 0.0–5.0)
HCT: 46.3 % (ref 39.0–52.0)
Hemoglobin: 15.7 g/dL (ref 13.0–17.0)
Lymphocytes Relative: 26.2 % (ref 12.0–46.0)
Lymphs Abs: 1.6 10*3/uL (ref 0.7–4.0)
MCHC: 33.9 g/dL (ref 30.0–36.0)
MCV: 91.6 fl (ref 78.0–100.0)
MONO ABS: 0.7 10*3/uL (ref 0.1–1.0)
Monocytes Relative: 11.8 % (ref 3.0–12.0)
Neutro Abs: 3.4 10*3/uL (ref 1.4–7.7)
Neutrophils Relative %: 55.3 % (ref 43.0–77.0)
Platelets: 177 10*3/uL (ref 150.0–400.0)
RBC: 5.05 Mil/uL (ref 4.22–5.81)
RDW: 14 % (ref 11.5–15.5)
WBC: 6 10*3/uL (ref 4.0–10.5)

## 2018-05-10 LAB — TSH: TSH: 4.89 u[IU]/mL — ABNORMAL HIGH (ref 0.35–4.50)

## 2018-05-10 LAB — HEMOGLOBIN A1C: Hgb A1c MFr Bld: 6.4 % (ref 4.6–6.5)

## 2018-05-17 ENCOUNTER — Ambulatory Visit: Payer: Medicare Other | Admitting: Internal Medicine

## 2018-05-17 ENCOUNTER — Encounter: Payer: Self-pay | Admitting: Internal Medicine

## 2018-05-17 VITALS — BP 100/72 | HR 76 | Temp 97.7°F | Ht 73.0 in | Wt 198.0 lb

## 2018-05-17 DIAGNOSIS — E119 Type 2 diabetes mellitus without complications: Secondary | ICD-10-CM | POA: Diagnosis not present

## 2018-05-17 DIAGNOSIS — Z0000 Encounter for general adult medical examination without abnormal findings: Secondary | ICD-10-CM | POA: Diagnosis not present

## 2018-05-17 NOTE — Patient Instructions (Signed)
Please continue all other medications as before, and refills have been done if requested.  Please have the pharmacy call with any other refills you may need.  Please continue your efforts at being more active, low cholesterol diet, and weight control.  You are otherwise up to date with prevention measures today.  Please keep your appointments with your specialists as you may have planned  Please return in 6 months, or sooner if needed, with Lab testing done 3-5 days before  

## 2018-05-17 NOTE — Assessment & Plan Note (Signed)
stable overall by history and exam, recent data reviewed with pt, and pt to continue medical treatment as before,  to f/u any worsening symptoms or concerns,  Lab Results  Component Value Date   HGBA1C 6.4 05/10/2018

## 2018-05-17 NOTE — Assessment & Plan Note (Signed)

## 2018-05-17 NOTE — Progress Notes (Signed)
Subjective:    Patient ID: Daniel Woodard, male    DOB: June 30, 1929, 84 y.o.   MRN: [id number]  HPI  Here for wellness and f/u;  Overall doing ok;  Pt denies Chest pain, worsening SOB, DOE, wheezing, orthopnea, PND, worsening LE edema, palpitations, dizziness or syncope.  Pt denies neurological change such as new headache, facial or extremity weakness.  Pt denies polydipsia, polyuria, or low sugar symptoms. Pt states overall good compliance with treatment and medications, good tolerability, and has been trying to follow appropriate diet.  Pt denies worsening depressive symptoms, suicidal ideation or panic. No fever, night sweats, wt loss, loss of appetite, or other constitutional symptoms.  Pt states good ability with ADL's, has low fall risk, home safety reviewed and adequate, no other significant changes in hearing or vision, and only occasionally active with exercise.  No new complaints Past Medical History:  Diagnosis Date  . ALLERGIC RHINITIS 04/09/2007  . Atrial fibrillation ([place])   . BACTEREMIA, 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  . BENIGN PROSTATIC HYPERTROPHY 04/09/2007  . Blood transfusion without reported diagnosis    really not sure thinks 30-40 years ago  . BRADYCARDIA 05/11/2009  . Carotid artery occlusion   . COLONIC POLYPS, HX OF 04/09/2007   ADENOMATOUS POLYPS 5465,0354  . Complete heart block ([place])   . COPD 04/09/2007  . Cough 03/27/2008  . DEPRESSION 04/09/2007  . DIABETES MELLITUS, TYPE II 10/09/2007  . DIVERTICULOSIS, COLON 04/09/2007  . DIZZINESS 10/06/2008  . GLUCOSE INTOLERANCE 04/09/2007  . HYPERLIPIDEMIA 03/27/2007  . HYPERTENSION 03/27/2007  . PERIPHERAL VASCULAR DISEASE 03/27/2007  . PNEUMONIA ORGANISM NOS 11/08/2007  . SHINGLES, HX OF 04/09/2007  . SHOULDER PAIN, BILATERAL 08/01/2007  . Ulcer    40 years ago   Past Surgical History:  Procedure Laterality Date  . APPENDECTOMY     84 years old  . CAROTID ENDARTERECTOMY  5/08   left  . CARPAL TUNNEL  RELEASE     bilateral  . COLONOSCOPY    . ESOPHAGOGASTRODUODENOSCOPY  06/07/2004  . HEMORRHOID BANDING  1990s  . PACEMAKER INSERTION  07/11/2013   MDT ADDRL1 pacemaker implanted by Dr Lovena Le for complete heart block  . PERMANENT PACEMAKER INSERTION N/A 07/11/2013   Procedure: PERMANENT PACEMAKER INSERTION;  Surgeon: Evans Lance, MD;  Location: [facility] CATH LAB;  Service: Cardiovascular;  Laterality: N/A;  . s/p PUD surgury     ? partial gastrectomy    reports that he quit smoking about 18 years ago. His smoking use included cigarettes. He has a 50.00 pack-year smoking history. He has never used smokeless tobacco. He reports that he does not drink alcohol or use drugs. family history includes Heart disease in his sister and another family member; Hypertension in his father, mother, sister, sister, sister, and sister; Lung cancer in his sister; Peripheral vascular disease in his brother; Stroke in an other family member; Ulcers in his father and sister. Allergies  Allergen Reactions  . Ace Inhibitors Anaphylaxis    REACTION: cough REACTION: cough REACTION: cough REACTION: cough   Current Outpatient Medications on File Prior to Visit  Medication Sig Dispense Refill  . ASPIRIN ADULT LOW STRENGTH 81 MG EC tablet TAKE ONE TABLET BY MOUTH ONCE DAILY 90 tablet 2  . Coenzyme Q10 (COQ-10) 200 MG CAPS Take 200 mg by mouth daily.     Marland Kitchen losartan-hydrochlorothiazide (HYZAAR) 100-25 MG tablet TAKE 1 TABLET BY MOUTH ONCE DAILY 90 tablet 3  . metoprolol succinate (TOPROL-XL) 25 MG 24 hr  tablet TAKE 1 TABLET BY MOUTH ONCE DAILY 90 tablet 3  . omeprazole (PRILOSEC) 20 MG capsule TAKE 1 CAPSULE BY MOUTH ONCE DAILY 90 capsule 1  . psyllium (METAMUCIL) 58.6 % powder Take 2 caps by mouth every day    . rosuvastatin (CRESTOR) 20 MG tablet TAKE 1 TABLET BY MOUTH AT BEDTIME 90 tablet 1  . FLUAD 0.5 ML SUSY PHARMACIST ADMINISTERED IMMUNIZATION ADMINISTERED AT TIME OF DISPENSING  0   No current facility-administered  medications on file prior to visit.    Review of Systems Constitutional: Negative for other unusual diaphoresis, sweats, appetite or weight changes HENT: Negative for other worsening hearing loss, ear pain, facial swelling, mouth sores or neck stiffness.   Eyes: Negative for other worsening pain, redness or other visual disturbance.  Respiratory: Negative for other stridor or swelling Cardiovascular: Negative for other palpitations or other chest pain  Gastrointestinal: Negative for worsening diarrhea or loose stools, blood in stool, distention or other pain Genitourinary: Negative for hematuria, flank pain or other change in urine volume.  Musculoskeletal: Negative for myalgias or other joint swelling.  Skin: Negative for other color change, or other wound or worsening drainage.  Neurological: Negative for other syncope or numbness. Hematological: Negative for other adenopathy or swelling Psychiatric/Behavioral: Negative for hallucinations, other worsening agitation, SI, self-injury, or new decreased concentration All other system neg per pt    Objective:   Physical Exam BP 100/72   Pulse 76   Temp 97.7 F (36.5 C) (Oral)   Ht _0  (1.854 m)   Wt 198 lb (89.8 kg)   SpO2 93%   BMI 26.12 kg/m  VS noted,  Constitutional: Pt is oriented to person, place, and time. Appears well-developed and well-nourished, in no significant distress and comfortable Head: Normocephalic and atraumatic  Eyes: Conjunctivae and EOM are normal. Pupils are equal, round, and reactive to light Right Ear: External ear normal without discharge Left Ear: External ear normal without discharge Nose: Nose without discharge or deformity Mouth/Throat: Oropharynx is without other ulcerations and moist  Neck: Normal range of motion. Neck supple. No JVD present. No tracheal deviation present or significant neck LA or mass Cardiovascular: Normal rate, regular rhythm, normal heart sounds and intact distal pulses.     Pulmonary/Chest: WOB normal and breath sounds without rales or wheezing  Abdominal: Soft. Bowel sounds are normal. NT. No HSM  Musculoskeletal: Normal range of motion. Exhibits no edema Lymphadenopathy: Has no other cervical adenopathy.  Neurological: Pt is alert and oriented to person, place, and time. Pt has normal reflexes. No cranial nerve deficit. Motor grossly intact, Gait intact Skin: Skin is warm and dry. No rash noted or new ulcerations Psychiatric:  Has normal mood and affect. Behavior is normal without agitation No other exam findings Lab Results  Component Value Date   WBC 6.0 05/10/2018   HGB 15.7 05/10/2018   HCT 46.3 05/10/2018   PLT 177.0 05/10/2018   GLUCOSE 128 (H) 05/10/2018   CHOL 139 05/10/2018   TRIG 166.0 (H) 05/10/2018   HDL 32.60 (L) 05/10/2018   LDLDIRECT 189.9 10/02/2007   LDLCALC 73 05/10/2018   ALT 15 05/10/2018   AST 16 05/10/2018   NA 139 05/10/2018   K 4.5 05/10/2018   CL 106 05/10/2018   CREATININE 1.46 05/10/2018   BUN 25 (H) 05/10/2018   CO2 25 05/10/2018   TSH 4.89 (H) 05/10/2018   PSA 0.65 05/18/2012   HGBA1C 6.4 05/10/2018   MICROALBUR 4.6 (H) 05/10/2018  Assessment & Plan:

## 2018-06-07 ENCOUNTER — Other Ambulatory Visit: Payer: Self-pay | Admitting: Internal Medicine

## 2018-06-11 LAB — CUP PACEART REMOTE DEVICE CHECK
Battery Impedance: 350 Ohm
Battery Voltage: 2.77 V
Brady Statistic AP VS Percent: 1 %
Brady Statistic AS VS Percent: 2 %
Date Time Interrogation Session: 20191114170813
Implantable Lead Implant Date: 20150212
Implantable Lead Location: 753859
Implantable Lead Location: 753860
Implantable Lead Model: 5076
Implantable Lead Model: 5076
Implantable Pulse Generator Implant Date: 20150212
Lead Channel Impedance Value: 582 Ohm
Lead Channel Pacing Threshold Amplitude: 0.75 V
Lead Channel Pacing Threshold Amplitude: 0.75 V
Lead Channel Pacing Threshold Pulse Width: 0.4 ms
Lead Channel Pacing Threshold Pulse Width: 0.4 ms
Lead Channel Setting Pacing Amplitude: 2 V
Lead Channel Setting Pacing Amplitude: 2.5 V
Lead Channel Setting Pacing Pulse Width: 0.4 ms
Lead Channel Setting Sensing Sensitivity: 4 mV
MDC IDC LEAD IMPLANT DT: 20150212
MDC IDC MSMT BATTERY REMAINING LONGEVITY: 96 mo
MDC IDC MSMT LEADCHNL RA IMPEDANCE VALUE: 445 Ohm
MDC IDC STAT BRADY AP VP PERCENT: 32 %
MDC IDC STAT BRADY AS VP PERCENT: 66 %

## 2018-07-11 ENCOUNTER — Encounter: Payer: Self-pay | Admitting: Internal Medicine

## 2018-07-12 ENCOUNTER — Ambulatory Visit (INDEPENDENT_AMBULATORY_CARE_PROVIDER_SITE_OTHER): Payer: BLUE CROSS/BLUE SHIELD

## 2018-07-12 DIAGNOSIS — I441 Atrioventricular block, second degree: Secondary | ICD-10-CM

## 2018-07-13 LAB — CUP PACEART REMOTE DEVICE CHECK
Battery Remaining Longevity: 95 mo
Battery Voltage: 2.77 V
Brady Statistic AP VP Percent: 31 %
Brady Statistic AS VP Percent: 64 %
Brady Statistic AS VS Percent: 4 %
Implantable Lead Implant Date: 20150212
Implantable Lead Location: 753859
Implantable Lead Location: 753860
Implantable Lead Model: 5076
Implantable Lead Model: 5076
Implantable Pulse Generator Implant Date: 20150212
Lead Channel Impedance Value: 470 Ohm
Lead Channel Impedance Value: 596 Ohm
Lead Channel Pacing Threshold Amplitude: 0.75 V
Lead Channel Pacing Threshold Amplitude: 0.75 V
Lead Channel Pacing Threshold Pulse Width: 0.4 ms
Lead Channel Setting Pacing Amplitude: 2 V
Lead Channel Setting Pacing Amplitude: 2.5 V
Lead Channel Setting Pacing Pulse Width: 0.4 ms
Lead Channel Setting Sensing Sensitivity: 4 mV
MDC IDC LEAD IMPLANT DT: 20150212
MDC IDC MSMT BATTERY IMPEDANCE: 375 Ohm
MDC IDC MSMT LEADCHNL RA PACING THRESHOLD PULSEWIDTH: 0.4 ms
MDC IDC SESS DTM: 20200213163734
MDC IDC STAT BRADY AP VS PERCENT: 1 %

## 2018-07-23 DIAGNOSIS — L570 Actinic keratosis: Secondary | ICD-10-CM | POA: Diagnosis not present

## 2018-07-24 NOTE — Progress Notes (Signed)
Remote pacemaker transmission.   

## 2018-07-26 ENCOUNTER — Encounter: Payer: Self-pay | Admitting: Internal Medicine

## 2018-07-26 ENCOUNTER — Ambulatory Visit: Payer: Medicare Other | Admitting: Internal Medicine

## 2018-07-26 VITALS — BP 132/82 | HR 74 | Ht 73.0 in | Wt 194.2 lb

## 2018-07-26 DIAGNOSIS — Z950 Presence of cardiac pacemaker: Secondary | ICD-10-CM | POA: Diagnosis not present

## 2018-07-26 DIAGNOSIS — I10 Essential (primary) hypertension: Secondary | ICD-10-CM | POA: Diagnosis not present

## 2018-07-26 DIAGNOSIS — I441 Atrioventricular block, second degree: Secondary | ICD-10-CM | POA: Diagnosis not present

## 2018-07-26 NOTE — Progress Notes (Signed)
HPI Mr. Lizarraga returns today for followup. He is a pleasant 85 yo man with a  H/o CHB, s/p PPM insertion, HTN, and DM. He has done well in the interim with no chest pain or sob. He has been busy helping to care for his wife who has moved to a SNF. He has not had syncope.  Allergies  Allergen Reactions  . Ace Inhibitors Anaphylaxis    REACTION: cough REACTION: cough REACTION: cough REACTION: cough     Current Outpatient Medications  Medication Sig Dispense Refill  . ASPIRIN ADULT LOW STRENGTH 81 MG EC tablet TAKE ONE TABLET BY MOUTH ONCE DAILY 90 tablet 2  . Coenzyme Q10 (COQ-10) 200 MG CAPS Take 200 mg by mouth daily.     Marland Kitchen FLUAD 0.5 ML SUSY PHARMACIST ADMINISTERED IMMUNIZATION ADMINISTERED AT TIME OF DISPENSING  0  . losartan-hydrochlorothiazide (HYZAAR) 100-25 MG tablet TAKE 1 TABLET BY MOUTH ONCE DAILY 90 tablet 1  . metoprolol succinate (TOPROL-XL) 25 MG 24 hr tablet TAKE 1 TABLET BY MOUTH ONCE DAILY 90 tablet 1  . omeprazole (PRILOSEC) 20 MG capsule TAKE 1 CAPSULE BY MOUTH ONCE DAILY 90 capsule 1  . psyllium (METAMUCIL) 58.6 % powder Take 2 caps by mouth every day    . rosuvastatin (CRESTOR) 20 MG tablet TAKE 1 TABLET BY MOUTH AT BEDTIME 90 tablet 1   No current facility-administered medications for this visit.      Past Medical History:  Diagnosis Date  . ALLERGIC RHINITIS 04/09/2007  . Atrial fibrillation ([place])   . BACTEREMIA, 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  . BENIGN PROSTATIC HYPERTROPHY 04/09/2007  . Blood transfusion without reported diagnosis    really not sure thinks 30-40 years ago  . BRADYCARDIA 05/11/2009  . Carotid artery occlusion   . COLONIC POLYPS, HX OF 04/09/2007   ADENOMATOUS POLYPS 1448,1856  . Complete heart block ([place])   . COPD 04/09/2007  . Cough 03/27/2008  . DEPRESSION 04/09/2007  . DIABETES MELLITUS, TYPE II 10/09/2007  . DIVERTICULOSIS, COLON 04/09/2007  . DIZZINESS 10/06/2008  . GLUCOSE INTOLERANCE 04/09/2007  .  HYPERLIPIDEMIA 03/27/2007  . HYPERTENSION 03/27/2007  . PERIPHERAL VASCULAR DISEASE 03/27/2007  . PNEUMONIA ORGANISM NOS 11/08/2007  . SHINGLES, HX OF 04/09/2007  . SHOULDER PAIN, BILATERAL 08/01/2007  . Ulcer    40 years ago    ROS:   All systems reviewed and negative except as noted in the HPI.   Past Surgical History:  Procedure Laterality Date  . APPENDECTOMY     85 years old  . CAROTID ENDARTERECTOMY  5/08   left  . CARPAL TUNNEL RELEASE     bilateral  . COLONOSCOPY    . ESOPHAGOGASTRODUODENOSCOPY  06/07/2004  . HEMORRHOID BANDING  1990s  . PACEMAKER INSERTION  07/11/2013   MDT ADDRL1 pacemaker implanted by Dr Lovena Le for complete heart block  . PERMANENT PACEMAKER INSERTION N/A 07/11/2013   Procedure: PERMANENT PACEMAKER INSERTION;  Surgeon: Evans Lance, MD;  Location: [facility] CATH LAB;  Service: Cardiovascular;  Laterality: N/A;  . s/p PUD surgury     ? partial gastrectomy     Family History  Problem Relation Age of Onset  . Hypertension Father   . Ulcers Father   . Hypertension Mother   . Hypertension Sister   . Peripheral vascular disease Brother   . Hypertension Sister   . Ulcers Sister        Ulcers  . Hypertension Sister   . Lung cancer Sister  smoked  . Heart disease Sister        Carotid   . Hypertension Sister   . Heart disease Other        Cardiovascular disorder, CHF  . Stroke Other        1st degree relative male and male  . Colon cancer Neg Hx      Social History   Socioeconomic History  . Marital status: Married    Spouse name: Not on file  . Number of children: 2  . Years of education: Not on file  . Highest education level: Not on file  Occupational History  . 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 Financial resource strain: Not on file  . Food insecurity:    Worry: Not on file    Inability: Not on file  . Transportation needs:    Medical: Not on file    Non-medical:  Not on file  Tobacco Use  . Smoking status: Former Smoker    Packs/day: 1.00    Years: 50.00    Pack years: 50.00    Types: Cigarettes    Last attempt to quit: 08/20/1999    Years since quitting: 18.9  . Smokeless tobacco: Never Used  Substance and Sexual Activity  . Alcohol use: No    Alcohol/week: 0.0 standard drinks  . Drug use: No  . Sexual activity: Not on file  Lifestyle  . Physical activity:    Days per week: Not on file    Minutes per session: Not on file  . Stress: Not on file  Relationships  . Social connections:    Talks on phone: Not on file    Gets together: Not on file    Attends religious service: Not on file    Active member of club or organization: Not on file    Attends meetings of clubs or organizations: Not on file    Relationship status: Not on file  . Intimate partner violence:    Fear of current or ex partner: Not on file    Emotionally abused: Not on file    Physically abused: Not on file    Forced sexual activity: Not on file  Other Topics Concern  . Not on file  Social History Narrative   Daily caffeine      BP 132/82   Pulse 74   Ht _0  (1.854 m)   Wt 194 lb 3.2 oz (88.1 kg)   SpO2 98%   BMI 25.62 kg/m   Physical Exam:  Well appearing NAD HEENT: Unremarkable Neck:  No JVD, no thyromegally Lymphatics:  No adenopathy Back:  No CVA tenderness Lungs:  Clear with no wheezes HEART:  Regular rate rhythm, no murmurs, no rubs, no clicks Abd:  soft, positive bowel sounds, no organomegally, no rebound, no guarding Ext:  2 plus pulses, no edema, no cyanosis, no clubbing Skin:  No rashes no nodules Neuro:  CN II through XII intact, motor grossly intact  EKG - NSR with Ventricular pacing  DEVICE  Normal device function.  See PaceArt for details.   Assess/Plan: 1. CHB - he is symptomatic, s/p PPM insertion. 2. HTN - his blood pressure is well controlled. No change in meds. 3. PPM - his medtronic DDD PM is working normally. We will  recheck in several months.  Mikle [email or web address].D.

## 2018-07-26 NOTE — Patient Instructions (Signed)
Medication Instructions:  Your physician recommends that you continue on your current medications as directed. Please refer to the Current Medication list given to you today.  Labwork: None ordered.  Testing/Procedures: None ordered.  Follow-Up: Your physician wants you to follow-up in: one year with Dr. Lovena Le.   You will receive a reminder letter in the mail two months in advance. If you don't receive a letter, please call our office to schedule the follow-up appointment.  Remote monitoring is used to monitor your Pacemaker from home. This monitoring reduces the number of office visits required to check your device to one time per year. It allows [place] to keep an eye on the functioning of your device to ensure it is working properly. You are scheduled for a device check from home on 10/11/2018. You may send your transmission at any time that day. If you have a wireless device, the transmission will be sent automatically. After your physician reviews your transmission, you will receive a postcard with your next transmission date.  Any Other Special Instructions Will Be Listed Below (If Applicable).  If you need a refill on your cardiac medications before your next appointment, please call your pharmacy.

## 2018-07-27 LAB — CUP PACEART INCLINIC DEVICE CHECK
Date Time Interrogation Session: 20200228103116
Implantable Lead Implant Date: 20150212
Implantable Lead Location: 753859
Implantable Lead Location: 753860
Implantable Lead Model: 5076
Implantable Lead Model: 5076
MDC IDC LEAD IMPLANT DT: 20150212
MDC IDC PG IMPLANT DT: 20150212

## 2018-10-11 ENCOUNTER — Other Ambulatory Visit: Payer: Self-pay

## 2018-10-11 ENCOUNTER — Ambulatory Visit (INDEPENDENT_AMBULATORY_CARE_PROVIDER_SITE_OTHER): Payer: Medicare Other | Admitting: *Deleted

## 2018-10-11 DIAGNOSIS — I441 Atrioventricular block, second degree: Secondary | ICD-10-CM | POA: Diagnosis not present

## 2018-10-11 LAB — CUP PACEART REMOTE DEVICE CHECK
Battery Impedance: 449 Ohm
Battery Remaining Longevity: 93 mo
Battery Voltage: 2.77 V
Brady Statistic AP VP Percent: 13 %
Brady Statistic AP VS Percent: 24 %
Brady Statistic AS VP Percent: 39 %
Brady Statistic AS VS Percent: 24 %
Date Time Interrogation Session: 20200514130347
Implantable Lead Implant Date: 20150212
Implantable Lead Implant Date: 20150212
Implantable Lead Location: 753859
Implantable Lead Location: 753860
Implantable Lead Model: 5076
Implantable Lead Model: 5076
Implantable Pulse Generator Implant Date: 20150212
Lead Channel Impedance Value: 450 Ohm
Lead Channel Impedance Value: 555 Ohm
Lead Channel Pacing Threshold Amplitude: 0.75 V
Lead Channel Pacing Threshold Amplitude: 0.75 V
Lead Channel Pacing Threshold Pulse Width: 0.4 ms
Lead Channel Pacing Threshold Pulse Width: 0.4 ms
Lead Channel Setting Pacing Amplitude: 2 V
Lead Channel Setting Pacing Amplitude: 2.5 V
Lead Channel Setting Pacing Pulse Width: 0.4 ms
Lead Channel Setting Sensing Sensitivity: 4 mV

## 2018-10-17 ENCOUNTER — Encounter: Payer: Self-pay | Admitting: Cardiology

## 2018-10-17 NOTE — Progress Notes (Signed)
Remote pacemaker transmission.   

## 2018-10-18 ENCOUNTER — Other Ambulatory Visit: Payer: Self-pay | Admitting: Internal Medicine

## 2018-10-25 DIAGNOSIS — D485 Neoplasm of uncertain behavior of skin: Secondary | ICD-10-CM | POA: Diagnosis not present

## 2018-10-25 DIAGNOSIS — L7211 Pilar cyst: Secondary | ICD-10-CM | POA: Diagnosis not present

## 2018-10-25 DIAGNOSIS — L821 Other seborrheic keratosis: Secondary | ICD-10-CM | POA: Diagnosis not present

## 2018-10-25 DIAGNOSIS — L570 Actinic keratosis: Secondary | ICD-10-CM | POA: Diagnosis not present

## 2018-11-15 ENCOUNTER — Other Ambulatory Visit (INDEPENDENT_AMBULATORY_CARE_PROVIDER_SITE_OTHER): Payer: Medicare Other

## 2018-11-15 DIAGNOSIS — E119 Type 2 diabetes mellitus without complications: Secondary | ICD-10-CM | POA: Diagnosis not present

## 2018-11-15 LAB — CBC WITH DIFFERENTIAL/PLATELET
Basophils Absolute: 0.1 10*3/uL (ref 0.0–0.1)
Basophils Relative: 1.1 % (ref 0.0–3.0)
Eosinophils Absolute: 0.4 10*3/uL (ref 0.0–0.7)
Eosinophils Relative: 5.5 % — ABNORMAL HIGH (ref 0.0–5.0)
HCT: 45.7 % (ref 39.0–52.0)
Hemoglobin: 15.3 g/dL (ref 13.0–17.0)
Lymphocytes Relative: 24.2 % (ref 12.0–46.0)
Lymphs Abs: 1.6 10*3/uL (ref 0.7–4.0)
MCHC: 33.5 g/dL (ref 30.0–36.0)
MCV: 89 fl (ref 78.0–100.0)
Monocytes Absolute: 0.8 10*3/uL (ref 0.1–1.0)
Monocytes Relative: 12 % (ref 3.0–12.0)
Neutro Abs: 3.7 10*3/uL (ref 1.4–7.7)
Neutrophils Relative %: 57.2 % (ref 43.0–77.0)
Platelets: 176 10*3/uL (ref 150.0–400.0)
RBC: 5.13 Mil/uL (ref 4.22–5.81)
RDW: 14.8 % (ref 11.5–15.5)
WBC: 6.4 10*3/uL (ref 4.0–10.5)

## 2018-11-15 LAB — BASIC METABOLIC PANEL
BUN: 20 mg/dL (ref 6–23)
CO2: 23 mEq/L (ref 19–32)
Calcium: 9 mg/dL (ref 8.4–10.5)
Chloride: 103 mEq/L (ref 96–112)
Creatinine, Ser: 1.47 mg/dL (ref 0.40–1.50)
GFR: 45.34 mL/min — ABNORMAL LOW (ref >60.00)
Glucose, Bld: 110 mg/dL — ABNORMAL HIGH (ref 70–99)
Potassium: 4.4 mEq/L (ref 3.5–5.1)
Sodium: 138 mEq/L (ref 135–145)

## 2018-11-15 LAB — MICROALBUMIN / CREATININE URINE RATIO
Creatinine,U: 331 mg/dL
Microalb Creat Ratio: 1.6 mg/g (ref 0.0–30.0)
Microalb, Ur: 5.2 mg/dL — ABNORMAL HIGH (ref 0.0–1.9)

## 2018-11-15 LAB — HEPATIC FUNCTION PANEL
ALT: 17 U/L (ref 0–53)
AST: 19 U/L (ref 0–37)
Albumin: 4.2 g/dL (ref 3.5–5.2)
Alkaline Phosphatase: 71 U/L (ref 39–117)
Bilirubin, Direct: 0.2 mg/dL (ref 0.0–0.3)
Total Bilirubin: 0.8 mg/dL (ref 0.2–1.2)
Total Protein: 7.3 g/dL (ref 6.0–8.3)

## 2018-11-15 LAB — URINALYSIS, ROUTINE W REFLEX MICROSCOPIC
Bilirubin Urine: NEGATIVE
Hgb urine dipstick: NEGATIVE
Ketones, ur: NEGATIVE
Leukocytes,Ua: NEGATIVE
Nitrite: NEGATIVE
Specific Gravity, Urine: >=1.03 — AB (ref 1.000–1.030)
Total Protein, Urine: NEGATIVE
Urine Glucose: NEGATIVE
Urobilinogen, UA: 0.2 (ref 0.0–1.0)
pH: 5 (ref 5.0–8.0)

## 2018-11-15 LAB — HEMOGLOBIN A1C: Hgb A1c MFr Bld: 6.8 % — ABNORMAL HIGH (ref 4.6–6.5)

## 2018-11-15 LAB — LIPID PANEL
Cholesterol: 133 mg/dL (ref 0–200)
HDL: 34.3 mg/dL — ABNORMAL LOW (ref >39.00)
LDL Cholesterol: 76 mg/dL (ref 0–99)
NonHDL: 98.93
Total CHOL/HDL Ratio: 4
Triglycerides: 114 mg/dL (ref 0.0–149.0)
VLDL: 22.8 mg/dL (ref 0.0–40.0)

## 2018-11-15 LAB — TSH: TSH: 4.35 u[IU]/mL (ref 0.35–4.50)

## 2018-11-22 ENCOUNTER — Other Ambulatory Visit: Payer: Self-pay

## 2018-11-22 ENCOUNTER — Ambulatory Visit (INDEPENDENT_AMBULATORY_CARE_PROVIDER_SITE_OTHER): Payer: Medicare Other | Admitting: Internal Medicine

## 2018-11-22 VITALS — BP 118/76 | HR 85 | Temp 97.9°F | Ht 73.0 in | Wt 198.0 lb

## 2018-11-22 DIAGNOSIS — Z0000 Encounter for general adult medical examination without abnormal findings: Secondary | ICD-10-CM | POA: Diagnosis not present

## 2018-11-22 DIAGNOSIS — Z23 Encounter for immunization: Secondary | ICD-10-CM | POA: Diagnosis not present

## 2018-11-22 DIAGNOSIS — E559 Vitamin D deficiency, unspecified: Secondary | ICD-10-CM | POA: Diagnosis not present

## 2018-11-22 DIAGNOSIS — E538 Deficiency of other specified B group vitamins: Secondary | ICD-10-CM

## 2018-11-22 DIAGNOSIS — F329 Major depressive disorder, single episode, unspecified: Secondary | ICD-10-CM

## 2018-11-22 DIAGNOSIS — E785 Hyperlipidemia, unspecified: Secondary | ICD-10-CM

## 2018-11-22 DIAGNOSIS — F32A Depression, unspecified: Secondary | ICD-10-CM

## 2018-11-22 DIAGNOSIS — E611 Iron deficiency: Secondary | ICD-10-CM

## 2018-11-22 DIAGNOSIS — E119 Type 2 diabetes mellitus without complications: Secondary | ICD-10-CM

## 2018-11-22 DIAGNOSIS — I10 Essential (primary) hypertension: Secondary | ICD-10-CM

## 2018-11-22 NOTE — Patient Instructions (Signed)
You had the Tdap tetanus shot today  Please continue all other medications as before, and refills have been done if requested.  Please have the pharmacy call with any other refills you may need.  Please continue your efforts at being more active, low cholesterol diet, and weight control.  You are otherwise up to date with prevention measures today.  Please keep your appointments with your specialists as you may have planned  Please return in 6 months, or sooner if needed, with Lab testing done 3-5 days before

## 2018-11-22 NOTE — Progress Notes (Signed)
Subjective:    Patient ID: Daniel Woodard, male    DOB: 1929-07-12, 85 y.o.   MRN: [id number]  HPI  Here for wellness and f/u;  Overall doing ok;  Pt denies Chest pain, worsening SOB, DOE, wheezing, orthopnea, PND, worsening LE edema, palpitations, dizziness or syncope.  Pt denies neurological change such as new headache, facial or extremity weakness.  Pt denies polydipsia, polyuria, or low sugar symptoms. Pt states overall good compliance with treatment and medications, good tolerability, and has been trying to follow appropriate diet.  Pt denies worsening depressive symptoms, suicidal ideation or panic. No fever, night sweats, wt loss, loss of appetite, or other constitutional symptoms.  Pt states good ability with ADL's, has low fall risk, home safety reviewed and adequate, no other significant changes in hearing or vision, and only occasionally active with exercise.  Wife died with PD and dementia mar 2018-08-23.  Grieving still significant.  Plans to /fu with optho swoon.  ,pchx Current Outpatient Medications on File Prior to Visit  Medication Sig Dispense Refill  . ASPIRIN ADULT LOW STRENGTH 81 MG EC tablet TAKE ONE TABLET BY MOUTH ONCE DAILY 90 tablet 2  . Coenzyme Q10 (COQ-10) 200 MG CAPS Take 200 mg by mouth daily.     Marland Kitchen FLUAD 0.5 ML SUSY PHARMACIST ADMINISTERED IMMUNIZATION ADMINISTERED AT TIME OF DISPENSING  0  . losartan-hydrochlorothiazide (HYZAAR) 100-25 MG tablet TAKE 1 TABLET BY MOUTH ONCE DAILY 90 tablet 1  . metoprolol succinate (TOPROL-XL) 25 MG 24 hr tablet TAKE 1 TABLET BY MOUTH ONCE DAILY 90 tablet 1  . omeprazole (PRILOSEC) 20 MG capsule Take 1 capsule by mouth once daily 90 capsule 0  . psyllium (METAMUCIL) 58.6 % powder Take 2 caps by mouth every day    . rosuvastatin (CRESTOR) 20 MG tablet TAKE 1 TABLET BY MOUTH AT BEDTIME 90 tablet 1   No current facility-administered medications on file prior to visit.    Review of Systems Constitutional: Negative for other unusual  diaphoresis, sweats, appetite or weight changes HENT: Negative for other worsening hearing loss, ear pain, facial swelling, mouth sores or neck stiffness.   Eyes: Negative for other worsening pain, redness or other visual disturbance.  Respiratory: Negative for other stridor or swelling Cardiovascular: Negative for other palpitations or other chest pain  Gastrointestinal: Negative for worsening diarrhea or loose stools, blood in stool, distention or other pain Genitourinary: Negative for hematuria, flank pain or other change in urine volume.  Musculoskeletal: Negative for myalgias or other joint swelling.  Skin: Negative for other color change, or other wound or worsening drainage.  Neurological: Negative for other syncope or numbness. Hematological: Negative for other adenopathy or swelling Psychiatric/Behavioral: Negative for hallucinations, other worsening agitation, SI, self-injury, or new decreased concentration All other system neg per pt    Objective:   Physical Exam BP 118/76   Pulse 85   Temp 97.9 F (36.6 C) (Oral)   Ht 6\' 1"  (1.854 m)   Wt 198 lb (89.8 kg)   SpO2 95%   BMI 26.12 kg/m  VS noted,  Constitutional: Pt is oriented to person, place, and time. Appears well-developed and well-nourished, in no significant distress and comfortable Head: Normocephalic and atraumatic  Eyes: Conjunctivae and EOM are normal. Pupils are equal, round, and reactive to light Right Ear: External ear normal without discharge Left Ear: External ear normal without discharge Nose: Nose without discharge or deformity Mouth/Throat: Oropharynx is without other ulcerations and moist  Neck: Normal range of  motion. Neck supple. No JVD present. No tracheal deviation present or significant neck LA or mass Cardiovascular: Normal rate, regular rhythm, normal heart sounds and intact distal pulses.   Pulmonary/Chest: WOB normal and breath sounds without rales or wheezing  Abdominal: Soft. Bowel sounds  are normal. NT. No HSM  Musculoskeletal: Normal range of motion. Exhibits no edema Lymphadenopathy: Has no other cervical adenopathy.  Neurological: Pt is alert and oriented to person, place, and time. Pt has normal reflexes. No cranial nerve deficit. Motor grossly intact, Gait intact Skin: Skin is warm and dry. No rash noted or new ulcerations Psychiatric:  Has normal mood and affect. Behavior is normal without agitation No other exam findings Lab Results  Component Value Date   WBC 6.4 11/15/2018   HGB 15.3 11/15/2018   HCT 45.7 11/15/2018   PLT 176.0 11/15/2018   GLUCOSE 110 (H) 11/15/2018   CHOL 133 11/15/2018   TRIG 114.0 11/15/2018   HDL 34.30 (L) 11/15/2018   LDLDIRECT 189.9 10/02/2007   LDLCALC 76 11/15/2018   ALT 17 11/15/2018   AST 19 11/15/2018   NA 138 11/15/2018   K 4.4 11/15/2018   CL 103 11/15/2018   CREATININE 1.47 11/15/2018   BUN 20 11/15/2018   CO2 23 11/15/2018   TSH 4.35 11/15/2018   PSA 0.65 05/18/2012   HGBA1C 6.8 (H) 11/15/2018   MICROALBUR 5.2 (H) 11/15/2018       Assessment & Plan:

## 2018-11-25 ENCOUNTER — Encounter: Payer: Self-pay | Admitting: Internal Medicine

## 2018-11-25 NOTE — Assessment & Plan Note (Signed)
stable overall by history and exam, recent data reviewed with pt, and pt to continue medical treatment as before,  to f/u any worsening symptoms or concerns  

## 2018-11-25 NOTE — Assessment & Plan Note (Signed)

## 2018-12-06 ENCOUNTER — Other Ambulatory Visit: Payer: Self-pay | Admitting: Internal Medicine

## 2018-12-12 ENCOUNTER — Other Ambulatory Visit: Payer: Self-pay | Admitting: Internal Medicine

## 2019-01-03 DIAGNOSIS — E119 Type 2 diabetes mellitus without complications: Secondary | ICD-10-CM | POA: Diagnosis not present

## 2019-01-03 DIAGNOSIS — H35362 Drusen (degenerative) of macula, left eye: Secondary | ICD-10-CM | POA: Diagnosis not present

## 2019-01-03 DIAGNOSIS — H35053 Retinal neovascularization, unspecified, bilateral: Secondary | ICD-10-CM | POA: Diagnosis not present

## 2019-01-03 DIAGNOSIS — H3554 Dystrophies primarily involving the retinal pigment epithelium: Secondary | ICD-10-CM | POA: Diagnosis not present

## 2019-01-10 ENCOUNTER — Ambulatory Visit (INDEPENDENT_AMBULATORY_CARE_PROVIDER_SITE_OTHER): Payer: Medicare Other | Admitting: *Deleted

## 2019-01-10 DIAGNOSIS — I441 Atrioventricular block, second degree: Secondary | ICD-10-CM

## 2019-01-10 LAB — CUP PACEART REMOTE DEVICE CHECK
Battery Impedance: 473 Ohm
Battery Remaining Longevity: 90 mo
Battery Voltage: 2.77 V
Brady Statistic AP VP Percent: 17 %
Brady Statistic AP VS Percent: 20 %
Brady Statistic AS VP Percent: 39 %
Brady Statistic AS VS Percent: 25 %
Date Time Interrogation Session: 20200813113548
Implantable Lead Implant Date: 20150212
Implantable Lead Implant Date: 20150212
Implantable Lead Location: 753859
Implantable Lead Location: 753860
Implantable Lead Model: 5076
Implantable Lead Model: 5076
Implantable Pulse Generator Implant Date: 20150212
Lead Channel Impedance Value: 438 Ohm
Lead Channel Impedance Value: 553 Ohm
Lead Channel Pacing Threshold Amplitude: 0.625 V
Lead Channel Pacing Threshold Amplitude: 0.75 V
Lead Channel Pacing Threshold Pulse Width: 0.4 ms
Lead Channel Pacing Threshold Pulse Width: 0.4 ms
Lead Channel Sensing Intrinsic Amplitude: 2.8 mV
Lead Channel Setting Pacing Amplitude: 2 V
Lead Channel Setting Pacing Amplitude: 2.5 V
Lead Channel Setting Pacing Pulse Width: 0.4 ms
Lead Channel Setting Sensing Sensitivity: 4 mV

## 2019-01-17 ENCOUNTER — Other Ambulatory Visit: Payer: Self-pay | Admitting: Internal Medicine

## 2019-01-21 ENCOUNTER — Encounter: Payer: Self-pay | Admitting: Cardiology

## 2019-01-21 NOTE — Progress Notes (Signed)
Remote pacemaker transmission.   

## 2019-02-07 DIAGNOSIS — L570 Actinic keratosis: Secondary | ICD-10-CM | POA: Diagnosis not present

## 2019-03-06 ENCOUNTER — Emergency Department (HOSPITAL_BASED_OUTPATIENT_CLINIC_OR_DEPARTMENT_OTHER): Payer: Medicare Other

## 2019-03-06 ENCOUNTER — Observation Stay (HOSPITAL_COMMUNITY): Payer: Medicare Other

## 2019-03-06 ENCOUNTER — Other Ambulatory Visit: Payer: Self-pay

## 2019-03-06 ENCOUNTER — Encounter (HOSPITAL_BASED_OUTPATIENT_CLINIC_OR_DEPARTMENT_OTHER): Payer: Self-pay | Admitting: Emergency Medicine

## 2019-03-06 ENCOUNTER — Observation Stay (HOSPITAL_BASED_OUTPATIENT_CLINIC_OR_DEPARTMENT_OTHER)
Admission: EM | Admit: 2019-03-06 | Discharge: 2019-03-07 | Disposition: A | Discharge status: Home / Self Care | Payer: Medicare Other | Attending: Internal Medicine | Admitting: Internal Medicine

## 2019-03-06 ENCOUNTER — Observation Stay (HOSPITAL_BASED_OUTPATIENT_CLINIC_OR_DEPARTMENT_OTHER): Payer: Medicare Other

## 2019-03-06 DIAGNOSIS — J449 Chronic obstructive pulmonary disease, unspecified: Secondary | ICD-10-CM | POA: Diagnosis present

## 2019-03-06 DIAGNOSIS — E785 Hyperlipidemia, unspecified: Secondary | ICD-10-CM | POA: Diagnosis not present

## 2019-03-06 DIAGNOSIS — Z950 Presence of cardiac pacemaker: Secondary | ICD-10-CM | POA: Diagnosis not present

## 2019-03-06 DIAGNOSIS — Z20828 Contact with and (suspected) exposure to other viral communicable diseases: Secondary | ICD-10-CM | POA: Insufficient documentation

## 2019-03-06 DIAGNOSIS — I6523 Occlusion and stenosis of bilateral carotid arteries: Secondary | ICD-10-CM | POA: Diagnosis not present

## 2019-03-06 DIAGNOSIS — Z79899 Other long term (current) drug therapy: Secondary | ICD-10-CM | POA: Insufficient documentation

## 2019-03-06 DIAGNOSIS — Z87891 Personal history of nicotine dependence: Secondary | ICD-10-CM | POA: Diagnosis not present

## 2019-03-06 DIAGNOSIS — Z03818 Encounter for observation for suspected exposure to other biological agents ruled out: Secondary | ICD-10-CM | POA: Diagnosis not present

## 2019-03-06 DIAGNOSIS — I10 Essential (primary) hypertension: Secondary | ICD-10-CM | POA: Diagnosis present

## 2019-03-06 DIAGNOSIS — R42 Dizziness and giddiness: Secondary | ICD-10-CM | POA: Diagnosis not present

## 2019-03-06 DIAGNOSIS — E119 Type 2 diabetes mellitus without complications: Secondary | ICD-10-CM | POA: Diagnosis not present

## 2019-03-06 DIAGNOSIS — Z7982 Long term (current) use of aspirin: Secondary | ICD-10-CM | POA: Diagnosis not present

## 2019-03-06 LAB — URINALYSIS, ROUTINE W REFLEX MICROSCOPIC
Bilirubin Urine: NEGATIVE
Glucose, UA: NEGATIVE mg/dL
Hgb urine dipstick: NEGATIVE
Ketones, ur: NEGATIVE mg/dL
Leukocytes,Ua: NEGATIVE
Nitrite: NEGATIVE
Protein, ur: NEGATIVE mg/dL
Specific Gravity, Urine: 1.02 (ref 1.005–1.030)
pH: 6 (ref 5.0–8.0)

## 2019-03-06 LAB — CBC WITH DIFFERENTIAL/PLATELET
Abs Immature Granulocytes: 0.02 10*3/uL (ref 0.00–0.07)
Basophils Absolute: 0 10*3/uL (ref 0.0–0.1)
Basophils Relative: 1 %
Eosinophils Absolute: 0.2 10*3/uL (ref 0.0–0.5)
Eosinophils Relative: 3 %
HCT: 46.8 % (ref 39.0–52.0)
Hemoglobin: 15.4 g/dL (ref 13.0–17.0)
Immature Granulocytes: 0 %
Lymphocytes Relative: 14 %
Lymphs Abs: 1 10*3/uL (ref 0.7–4.0)
MCH: 30 pg (ref 26.0–34.0)
MCHC: 32.9 g/dL (ref 30.0–36.0)
MCV: 91.1 fL (ref 80.0–100.0)
Monocytes Absolute: 0.5 10*3/uL (ref 0.1–1.0)
Monocytes Relative: 8 %
Neutro Abs: 5.1 10*3/uL (ref 1.7–7.7)
Neutrophils Relative %: 74 %
Platelets: 153 10*3/uL (ref 150–400)
RBC: 5.14 MIL/uL (ref 4.22–5.81)
RDW: 13.2 % (ref 11.5–15.5)
WBC: 6.9 10*3/uL (ref 4.0–10.5)
nRBC: 0 % (ref 0.0–0.2)

## 2019-03-06 LAB — CBC
HCT: 42 % (ref 39.0–52.0)
Hemoglobin: 14.5 g/dL (ref 13.0–17.0)
MCH: 31.1 pg (ref 26.0–34.0)
MCHC: 34.5 g/dL (ref 30.0–36.0)
MCV: 90.1 fL (ref 80.0–100.0)
Platelets: 136 10*3/uL — ABNORMAL LOW (ref 150–400)
RBC: 4.66 MIL/uL (ref 4.22–5.81)
RDW: 13.1 % (ref 11.5–15.5)
WBC: 7.6 10*3/uL (ref 4.0–10.5)
nRBC: 0 % (ref 0.0–0.2)

## 2019-03-06 LAB — BASIC METABOLIC PANEL
Anion gap: 11 (ref 5–15)
BUN: 38 mg/dL — ABNORMAL HIGH (ref 8–23)
CO2: 23 mmol/L (ref 22–32)
Calcium: 9.6 mg/dL (ref 8.9–10.3)
Chloride: 102 mmol/L (ref 98–111)
Creatinine, Ser: 1.52 mg/dL — ABNORMAL HIGH (ref 0.61–1.24)
GFR calc Af Amer: 47 mL/min — ABNORMAL LOW (ref >60)
GFR calc non Af Amer: 41 mL/min — ABNORMAL LOW (ref >60)
Glucose, Bld: 166 mg/dL — ABNORMAL HIGH (ref 70–99)
Potassium: 4.5 mmol/L (ref 3.5–5.1)
Sodium: 136 mmol/L (ref 135–145)

## 2019-03-06 LAB — PROTIME-INR
INR: 1.1 (ref 0.8–1.2)
Prothrombin Time: 13.6 seconds (ref 11.4–15.2)

## 2019-03-06 LAB — CBG MONITORING, ED: Glucose-Capillary: 150 mg/dL — ABNORMAL HIGH (ref 70–99)

## 2019-03-06 LAB — CREATININE, SERUM
Creatinine, Ser: 1.65 mg/dL — ABNORMAL HIGH (ref 0.61–1.24)
GFR calc Af Amer: 43 mL/min — ABNORMAL LOW (ref >60)
GFR calc non Af Amer: 37 mL/min — ABNORMAL LOW (ref >60)

## 2019-03-06 LAB — TROPONIN I (HIGH SENSITIVITY)
Troponin I (High Sensitivity): 15 ng/L (ref <18)
Troponin I (High Sensitivity): 17 ng/L (ref <18)

## 2019-03-06 LAB — GLUCOSE, CAPILLARY: Glucose-Capillary: 97 mg/dL (ref 70–99)

## 2019-03-06 LAB — ETHANOL: Alcohol, Ethyl (B): <10 mg/dL (ref <10)

## 2019-03-06 LAB — APTT: aPTT: 26 seconds (ref 24–36)

## 2019-03-06 LAB — SARS CORONAVIRUS 2 BY RT PCR (HOSPITAL ORDER, PERFORMED IN ~~LOC~~ HOSPITAL LAB): SARS Coronavirus 2: NEGATIVE

## 2019-03-06 MED ORDER — SODIUM CHLORIDE 0.9 % IV BOLUS
500.0000 mL | Freq: Once | INTRAVENOUS | Status: AC
  Administered 2019-03-06: 500 mL via INTRAVENOUS

## 2019-03-06 MED ORDER — SODIUM CHLORIDE 0.9 % IV SOLN
INTRAVENOUS | Status: DC
  Administered 2019-03-06: 21:00:00 via INTRAVENOUS

## 2019-03-06 MED ORDER — ACETAMINOPHEN 160 MG/5ML PO SOLN: 650.0000 mg | ORAL | Status: DC | PRN

## 2019-03-06 MED ORDER — IOHEXOL 350 MG/ML SOLN
100.0000 mL | Freq: Once | INTRAVENOUS | Status: AC | PRN
  Administered 2019-03-06: 60 mL via INTRAVENOUS

## 2019-03-06 MED ORDER — ENOXAPARIN SODIUM 40 MG/0.4ML ~~LOC~~ SOLN
40.0000 mg | SUBCUTANEOUS | Status: DC
  Administered 2019-03-06: 40 mg via SUBCUTANEOUS
  Filled 2019-03-06: qty 0.4

## 2019-03-06 MED ORDER — IOHEXOL 350 MG/ML SOLN
50.0000 mL | Freq: Once | INTRAVENOUS | Status: AC | PRN
  Administered 2019-03-06: 50 mL via INTRAVENOUS

## 2019-03-06 MED ORDER — ACETAMINOPHEN 650 MG RE SUPP: 650.0000 mg | RECTAL | Status: DC | PRN

## 2019-03-06 MED ORDER — SENNOSIDES-DOCUSATE SODIUM 8.6-50 MG PO TABS: 1.0000 | ORAL_TABLET | Freq: Every evening | ORAL | Status: DC | PRN

## 2019-03-06 MED ORDER — ASPIRIN 325 MG PO TABS
325.0000 mg | ORAL_TABLET | Freq: Every day | ORAL | Status: DC
  Administered 2019-03-06 – 2019-03-07 (×2): 325 mg via ORAL
  Filled 2019-03-06 (×2): qty 1

## 2019-03-06 MED ORDER — SODIUM CHLORIDE 0.9 % IV SOLN
Freq: Once | INTRAVENOUS | Status: AC
  Administered 2019-03-06: 14:00:00 via INTRAVENOUS

## 2019-03-06 MED ORDER — ONDANSETRON HCL 4 MG/2ML IJ SOLN
2.0000 mg | Freq: Once | INTRAMUSCULAR | Status: AC
  Administered 2019-03-06: 2 mg via INTRAVENOUS
  Filled 2019-03-06: qty 2

## 2019-03-06 MED ORDER — PANTOPRAZOLE SODIUM 40 MG PO TBEC
40.0000 mg | DELAYED_RELEASE_TABLET | Freq: Every day | ORAL | Status: DC
  Administered 2019-03-07: 10:00:00 40 mg via ORAL
  Filled 2019-03-06: qty 1

## 2019-03-06 MED ORDER — ACETAMINOPHEN 325 MG PO TABS: 650.0000 mg | ORAL_TABLET | ORAL | Status: DC | PRN

## 2019-03-06 MED ORDER — ASPIRIN 81 MG PO CHEW
324.0000 mg | CHEWABLE_TABLET | Freq: Once | ORAL | Status: AC
  Administered 2019-03-06: 324 mg via ORAL
  Filled 2019-03-06: qty 4

## 2019-03-06 MED ORDER — STROKE: EARLY STAGES OF RECOVERY BOOK
Freq: Once | Status: AC
  Administered 2019-03-06: 21:00:00
  Filled 2019-03-06: qty 1

## 2019-03-06 MED ORDER — INSULIN ASPART 100 UNIT/ML ~~LOC~~ SOLN: 0.0000 [IU] | Freq: Three times a day (TID) | SUBCUTANEOUS | Status: DC

## 2019-03-06 MED ORDER — METOPROLOL SUCCINATE ER 25 MG PO TB24
25.0000 mg | ORAL_TABLET | Freq: Every day | ORAL | Status: DC
  Administered 2019-03-07: 25 mg via ORAL
  Filled 2019-03-06: qty 1

## 2019-03-06 MED ORDER — ASPIRIN 300 MG RE SUPP: 300.0000 mg | Freq: Every day | RECTAL | Status: DC

## 2019-03-06 MED ORDER — ROSUVASTATIN CALCIUM 20 MG PO TABS
20.0000 mg | ORAL_TABLET | Freq: Every day | ORAL | Status: DC
  Administered 2019-03-06: 21:00:00 20 mg via ORAL
  Filled 2019-03-06: qty 1

## 2019-03-06 MED ORDER — MECLIZINE HCL 25 MG PO TABS
12.5000 mg | ORAL_TABLET | Freq: Once | ORAL | Status: AC
  Administered 2019-03-06: 12.5 mg via ORAL
  Filled 2019-03-06: qty 1

## 2019-03-06 NOTE — Progress Notes (Signed)
Pt arrived to 3W05. Alert and oriented x4, complains of no pain. States no dizziness at this time

## 2019-03-06 NOTE — Plan of Care (Addendum)
[facility] transfer obtained from [place], [facility]  Mr. Jablonowski is a 85 year old male with pmh HTN, d, Afib, DM, s/p PM, PVD, COPD, carotid artery occlusion: Who presented with complaints of dizziness and feeling off balance.  The brain did not show any acute abnormalities.  CTA of the head and neck revealed extensive calcified plaques resulting in segmental occlusion and high-grade stenosis of the proximal left subclavian artery with reconstitution.  Carotid artery/vertebral artery duplex was recommended per further evaluation of left subclavian steal phenomenon.  Labs significant for BUN 38 and creatinine 1.52.  COVID-19 screening negative.  Tele-neurology had been consulted recommended repeat CT of the brain in 24 hours to exclude acute stroke given unable to have MRI and to check echocardiogram.  Pacemaker interrogated and noted a brief arrhythmia on September 5 possibly recently.  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  Patient accepted to a medical telemetry bed as observation for dizziness.  Patient had received 500 mL normal saline IV fluids and then was placed on rate of 100 mL/h.  Orders placed to check orthostatic vital signs.

## 2019-03-06 NOTE — ED Provider Notes (Addendum)
[facility] Provider Note   CSN: [id number] Arrival date & time: 03/06/19  1006     History   Chief Complaint Chief Complaint  Patient presents with   Dizziness    HPI Daniel Woodard is a 85 y.o. male with history of pacemaker, carotid artery occlusion, atrial fibrillation, diabetes, hypertension, hyperlipidemia, PVD, COPD, presents today for dizziness that began at 2 AM this morning.  Patient reports that he woke up from sleep at 2 AM and felt very dizzy he describes a room spinning sensation that has been waxing and waning since onset at 2 AM.  He reports that he is feeling slightly better on arrival.  He reports that dizziness was at its peak around 4 AM and he had difficulty with ambulation at that time feeling as though he may fall.  He reports one similar episode of dizziness many years ago but was not worked up for it.  He reports some nausea without vomiting associated with his dizziness but denies any other symptoms today.  Patient reports that he has been in his normal state of health over the past week.  Denies fever/chills, headache, vision changes, neck pain/stiffness, chest pain/shortness of breath, abdominal pain, vomiting, diarrhea, dysuria/hematuria, numbness/tingling, weakness, confusion, speech difficulty, facial asymmetry, fall/injury or any additional concerns.     HPI  Past Medical History:  Diagnosis Date   ALLERGIC RHINITIS 04/09/2007   Atrial fibrillation ([place])    BACTEREMIA, 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   BENIGN PROSTATIC HYPERTROPHY 04/09/2007   Blood transfusion without reported diagnosis    really not sure thinks 30-40 years ago   BRADYCARDIA 05/11/2009   Carotid artery occlusion    COLONIC POLYPS, HX OF 04/09/2007   ADENOMATOUS POLYPS 2004,2008   Complete heart block (HCC)    COPD 04/09/2007   Cough 03/27/2008   DEPRESSION 04/09/2007   DIABETES MELLITUS, TYPE II 10/09/2007   DIVERTICULOSIS,  COLON 04/09/2007   DIZZINESS 10/06/2008   GLUCOSE INTOLERANCE 04/09/2007   HYPERLIPIDEMIA 03/27/2007   HYPERTENSION 03/27/2007   PERIPHERAL VASCULAR DISEASE 03/27/2007   PNEUMONIA ORGANISM NOS 11/08/2007   SHINGLES, HX OF 04/09/2007   SHOULDER PAIN, BILATERAL 08/01/2007   Ulcer    40 years ago    Patient Active Problem List   Diagnosis Date Noted   Renal insufficiency 11/16/2017   Skin lesion of scalp 05/18/2017   COPD exacerbation ([place]) 11/20/2015   Choroidal neovascularization of left eye 07/17/2015   Pseudophakia of both eyes 07/17/2015   Vitelliform lesion of macula 07/17/2015   Vitreomacular adhesion of right eye 07/17/2015   Insomnia 05/22/2015   Cortical age-related cataract of left eye 05/19/2015   Nuclear sclerotic cataract of left eye 05/19/2015   Cataract, mature 04/28/2015   Olecranon bursitis of right elbow 02/03/2015   Pacemaker 10/10/2013   Carotid artery occlusion 09/19/2013   Aftercare following surgery of the circulatory system, NEC 09/19/2013   Second degree AV block 07/09/2013   Preventative health care 11/17/2010   BRADYCARDIA 05/11/2009   Dizziness 10/06/2008   Cough 03/27/2008   PNEUMONIA ORGANISM NOS 11/08/2007   Diabetes ([place]) 10/09/2007   SHOULDER PAIN, BILATERAL 08/01/2007   Shoulder joint pain 08/01/2007   BACTEREMIA, 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   Depression 04/09/2007   Allergic rhinitis 04/09/2007   COPD GOLD II/ emphysematous features 04/09/2007   Diverticulosis of colon 04/09/2007   Benign prostatic hyperplasia 04/09/2007   History of colonic polyps 04/09/2007   SHINGLES, HX OF 04/09/2007   Hyperlipidemia 03/27/2007  Essential hypertension 03/27/2007   Peripheral vascular disease ([place]) 03/27/2007    Past Surgical History:  Procedure Laterality Date   APPENDECTOMY     85 years old   CAROTID ENDARTERECTOMY  5/08   left   CARPAL TUNNEL RELEASE     bilateral   COLONOSCOPY       ESOPHAGOGASTRODUODENOSCOPY  06/07/2004   HEMORRHOID BANDING  1990s   PACEMAKER INSERTION  07/11/2013   MDT ADDRL1 pacemaker implanted by Dr Lovena Le for complete heart block   PERMANENT PACEMAKER INSERTION N/A 07/11/2013   Procedure: PERMANENT PACEMAKER INSERTION;  Surgeon: Evans Lance, MD;  Location: [facility] CATH LAB;  Service: Cardiovascular;  Laterality: N/A;   s/p PUD surgury     ? partial gastrectomy        Home Medications    Prior to Admission medications   Medication Sig Start Date End Date Taking? Authorizing Provider  ASPIRIN ADULT LOW STRENGTH 81 MG EC tablet TAKE ONE TABLET BY MOUTH ONCE DAILY 02/13/17   Biagio Borg, MD  Coenzyme Q10 (COQ-10) 200 MG CAPS Take 200 mg by mouth daily.     [provider]  FLUAD 0.5 ML SUSY PHARMACIST ADMINISTERED IMMUNIZATION ADMINISTERED AT TIME OF DISPENSING 03/12/18   [provider]  losartan-hydrochlorothiazide (HYZAAR) 100-25 MG tablet Take 1 tablet by mouth once daily 12/06/18   Biagio Borg, MD  metoprolol succinate (TOPROL-XL) 25 MG 24 hr tablet Take 1 tablet by mouth once daily 12/12/18   Biagio Borg, MD  omeprazole (PRILOSEC) 20 MG capsule Take 1 capsule by mouth once daily 01/17/19   Biagio Borg, MD  psyllium (METAMUCIL) 58.6 % powder Take 2 caps by mouth every day    [provider]  rosuvastatin (CRESTOR) 20 MG tablet TAKE 1 TABLET BY MOUTH AT BEDTIME 01/17/19   Biagio Borg, MD    Family History Family History  Problem Relation Age of Onset   Hypertension Father    Ulcers Father    Hypertension Mother    Hypertension Sister    Peripheral vascular disease Brother    Hypertension Sister    Ulcers Sister        Ulcers   Hypertension Sister    Lung cancer Sister        smoked   Heart disease Sister        Carotid    Hypertension Sister    Heart disease Other        Cardiovascular disorder, CHF   Stroke Other        1st degree relative male and male   Colon cancer Neg Hx      Social History Social History   Tobacco Use   Smoking status: Former Smoker    Packs/day: 1.00    Years: 50.00    Pack years: 50.00    Types: Cigarettes    Quit date: 08/20/1999    Years since quitting: 19.5   Smokeless tobacco: Never Used  Substance Use Topics   Alcohol use: No    Alcohol/week: 0.0 standard drinks   Drug use: No     Allergies   Ace inhibitors   Review of Systems Review of Systems Ten systems are reviewed and are negative for acute change except as noted in the HPI   Physical Exam Updated Vital Signs BP (!) 136/97    Pulse 82    Temp 97.8 F (36.6 C) (Oral)    Resp 18    SpO2 93%  Physical Exam Constitutional:      General: He is not in acute distress.    Appearance: Normal appearance. He is well-developed. He is not ill-appearing or diaphoretic.  HENT:     Head: Normocephalic and atraumatic.     Right Ear: External ear normal.     Left Ear: External ear normal.     Nose: Nose normal.  Eyes:     General: Vision grossly intact. Gaze aligned appropriately.     Pupils: Pupils are equal, round, and reactive to light.  Neck:     Musculoskeletal: Normal range of motion.     Trachea: Trachea and phonation normal. No tracheal deviation.  Cardiovascular:     Rate and Rhythm: Normal rate and regular rhythm.     Pulses:          Dorsalis pedis pulses are 2+ on the right side and 2+ on the left side.     Heart sounds: Normal heart sounds.  Pulmonary:     Effort: Pulmonary effort is normal. No accessory muscle usage or respiratory distress.     Breath sounds: Normal breath sounds and air entry.  Abdominal:     General: There is no distension.     Palpations: Abdomen is soft.     Tenderness: There is no abdominal tenderness. There is no guarding or rebound.  Musculoskeletal: Normal range of motion.  Skin:    General: Skin is warm and dry.  Neurological:     Mental Status: He is alert.     GCS: GCS eye subscore is 4. GCS verbal subscore is  5. GCS motor subscore is 6.     Comments: Mental Status: Alert, oriented, thought content appropriate, able to give a coherent history. Speech fluent without evidence of aphasia. Able to follow 2 step commands without difficulty. Cranial Nerves: II: Peripheral visual fields grossly normal, pupils equal, round, reactive to light III,IV, VI: ptosis not present, extra-ocular motions intact bilaterally V,VII: smile symmetric, eyebrows raise symmetric, facial light touch sensation equal VIII: hearing grossly normal to voice X: uvula elevates symmetrically XI: bilateral shoulder shrug symmetric and strong XII: midline tongue extension without fassiculations Motor: Normal tone. 5/5 strength in upper and lower extremities bilaterally including strong and equal grip strength and dorsiflexion/plantar flexion Sensory: Sensation intact to light touch in all extremities.Negative Romberg.  Cerebellar: normal finger-to-nose with bilateral upper extremities. Normal heel-to -shin balance bilaterally of the lower extremity. No pronator drift.  CV: distal pulses palpable throughout  Psychiatric:        Behavior: Behavior normal.    ED Treatments / Results  Labs (all labs ordered are listed, but only abnormal results are displayed) Labs Reviewed  BASIC METABOLIC PANEL - Abnormal; Notable for the following components:      Result Value   Glucose, Bld 166 (*)    BUN 38 (*)    Creatinine, Ser 1.52 (*)    GFR calc non Af Amer 41 (*)    GFR calc Af Amer 47 (*)    All other components within normal limits  CBG MONITORING, ED - Abnormal; Notable for the following components:   Glucose-Capillary 150 (*)    All other components within normal limits  SARS CORONAVIRUS 2 (HOSPITAL ORDER, [facility])  CBC WITH DIFFERENTIAL/PLATELET  URINALYSIS, ROUTINE W REFLEX MICROSCOPIC  ETHANOL  PROTIME-INR  APTT  TROPONIN I (HIGH SENSITIVITY)  TROPONIN I (HIGH SENSITIVITY)     EKG EKG Interpretation  Date/Time:  Wednesday March 06 2019 10:21:39 EDT Ventricular Rate:  74 PR Interval:    QRS Duration: 161 QT Interval:  448 QTC Calculation: 498 R Axis:   -83 Text Interpretation:  Atrial-sensed ventricular-paced rhythm No further analysis attempted due to paced rhythm similar to prior 2/15 Confirmed by Aletta Edouard [id number]) on 03/06/2019 10:26:34 AM   Radiology Ct Code Stroke Cta Head W/wo Contrast  Result Date: 03/06/2019 CLINICAL DATA:  Vertigo, persistent, central. Additional history provided: Dizziness upon waking this morning, nausea. Per provider, patient not a stroke code. EXAM: CT ANGIOGRAPHY HEAD AND NECK TECHNIQUE: Multidetector CT imaging of the head and neck was performed using the standard protocol during bolus administration of intravenous contrast. Multiplanar CT image reconstructions and MIPs were obtained to evaluate the vascular anatomy. Carotid stenosis measurements (when applicable) are obtained utilizing NASCET criteria, using the distal internal carotid diameter as the denominator. CONTRAST:  80m OMNIPAQUE IOHEXOL 350 MG/ML SOLN COMPARISON:  Noncontrast head CT performed earlier the same day 03/06/2019., MRA neck 04/29/2006. FINDINGS: CT HEAD FINDINGS Brain: There is no evidence of acute intracranial hemorrhage. Calcification within the bilateral basal ganglia. Again demonstrated is extensive patchy and confluent hypodensity within the cerebral white matter and to a lesser degree within the cerebellar white matter, nonspecific but consistent with chronic small vessel ischemic disease. No demarcated cortical infarction is identified. No evidence of intracranial mass. No midline shift or extra-axial fluid collection. Mild generalized parenchymal atrophy. Vascular: No hyperdense vessel. Atherosclerotic calcification of the carotid artery siphons and vertebrobasilar system. Skull: No calvarial fracture. Sinuses: Minimal scattered paranasal sinus  mucosal thickening. No significant mastoid effusion Orbits: Visualized orbits demonstrate no acute abnormality. Review of the MIP images confirms the above findings These results were called by telephone at the time of interpretation on 03/06/2019 at 3:04 pm [facility] verbally acknowledged these results. CTA NECK FINDINGS Aortic arch: Included portions of the aortic arch demonstrate no evidence of dissection or aneurysm. Prominent soft and calcified plaque within the visualized aortic arch and proximal major branch vessels of the neck. Prominent calcified plaque within the proximal left subclavian artery with either severe stenosis or segmental occlusion of the proximal left subclavian artery. Reconstitution proximal to left vertebral artery takeoff. Right carotid system: Common carotid artery patent without significant stenosis. Prominent calcified plaque at the carotid bifurcation and extending into the proximal ICA. Resultant approximate 40% stenosis of the proximal ICA. Distal to this, the ICA is patent within the neck without significant stenosis. Left carotid system: CCA and ICA patent within the neck without significant stenosis (50% or greater). Scattered soft and calcified plaque. Vertebral arteries: Calcified plaque at the origin of the right vertebral artery with suspected at least moderate ostial stenosis. Calcified plaque at the origin of the left vertebral artery with moderate ostial stenosis. More distally, the bilateral vertebral arteries are otherwise patent within the neck without significant stenosis (50% or greater). Skeleton: Cervical spondylosis with multilevel moderate/severe disc height loss, small posterior disc osteophytes, uncovertebral and facet hypertrophy. C4-C5 grade 1 anterolisthesis. No acute bony abnormality. Other neck: No soft tissue neck mass or pathologically enlarged cervical chain lymph nodes. Thyroid unremarkable. Upper chest: Moderate centrilobular emphysema  within the imaged lung apices. Partially visualized pacer leads. Review of the MIP images confirms the above findings CTA HEAD FINDINGS Anterior circulation: Calcified atherosclerotic disease within the bilateral intracranial internal carotid arteries. Regions of mild stenosis within the intracranial right internal carotid artery. Mild-to-moderate stenosis within the cavernous left ICA. The right middle cerebral artery is  patent without significant stenosis. Hypoplastic the A1 right anterior cerebral artery. The A1 left anterior cerebral artery predominantly supplies the bilateral A2 anterior cerebral arteries. Mild-to-moderate focal stenosis within the proximal A2 right anterior cerebral artery. The left anterior cerebral artery is patent without significant proximal stenosis. The left middle cerebral artery is patent without significant proximal stenosis. Posterior circulation: The intracranial vertebral arteries are patent without significant stenosis. Flow is seen within the right PICA. The left PICA is poorly delineated. The basilar artery is patent. Mild focal stenosis within the mid basilar artery. Flow is seen within the proximal left AICA. The right AICA is poorly delineated. Flow is seen within the proximal superior cerebellar arteries bilaterally. Predominantly fetal origin of the right posterior cerebral artery. The right posterior cerebral artery is patent without significant proximal stenosis. The left posterior cerebral artery is patent without significant proximal stenosis. No intracranial aneurysm is identified Venous sinuses: Within limitations of contrast timing, no convincing thrombus. Anatomic variants: As described. Review of the MIP images confirms the above findings IMPRESSION: CT head: 1. No evidence of acute intracranial hemorrhage or acute demarcated cortical infarction. 2. Generalized parenchymal atrophy with extensive chronic small vessel ischemic disease. CTA head: 1. The left PICA and  right AICA are poorly delineated. This may be due to small vessel size. Stenoses within these vessels cannot be excluded. 2. Intracranial atherosclerotic disease with multifocal stenoses, most notably as follows. 3. Mild-to-moderate focal stenosis within the cavernous left internal carotid artery. 4. Mild-to-moderate focal stenosis within the proximal A2 right anterior cerebral artery. 5. Mild focal stenosis within the mid basilar artery. CTA neck: 1. Extensive calcified plaque results in either segmental occlusion or segmental high-grade stenosis of the proximal left subclavian artery. Reconstitution of the left subclavian artery proximal to left vertebral artery takeoff. Consider carotid artery/vertebral artery duplex for further evaluation to assess for left subclavian steal phenomenon. 2. Bilateral common and internal carotid arteries patent within the neck. Calcified plaque within the proximal right ICA results in 40% stenosis. 3. Plaque at the origins of the bilateral vertebral arteries with at least moderate right ostial stenosis and moderate left ostial stenosis. Distal to this, the bilateral vertebral arteries are patent within the neck without significant stenosis (50% or greater). Electronically Signed   By: Kellie Simmering   On: 03/06/2019 15:40   Ct Head Wo Contrast  Result Date: 03/06/2019 CLINICAL DATA:  Dizziness and nausea. Vertiginous syndrome. EXAM: CT HEAD WITHOUT CONTRAST TECHNIQUE: Contiguous axial images were obtained from the base of the skull through the vertex without intravenous contrast. COMPARISON:  None. FINDINGS: Brain: No evidence of acute infarction, hemorrhage, hydrocephalus, extra-axial collection or mass lesion/mass effect. Diffuse mild cerebral cortical atrophy. No ventricular dilatation. Extensive periventricular white matter lucency consistent with chronic small vessel ischemic change. Vascular: No hyperdense vessel or unexpected calcification. Skull: Normal. Negative for  fracture or focal lesion. Sinuses/Orbits: No acute finding. Evidence of bilateral eye surgery. Other: None. IMPRESSION: No acute abnormality. Extensive chronic small vessel ischemic changes in the periventricular white matter. Diffuse mild atrophy. Electronically Signed   By: Lorriane Shire M.D.   On: 03/06/2019 11:17   Ct Code Stroke Cta Neck W/wo Contrast  Result Date: 03/06/2019 CLINICAL DATA:  Vertigo, persistent, central. Additional history provided: Dizziness upon waking this morning, nausea. Per provider, patient not a stroke code. EXAM: CT ANGIOGRAPHY HEAD AND NECK TECHNIQUE: Multidetector CT imaging of the head and neck was performed using the standard protocol during bolus administration of intravenous contrast. Multiplanar CT  image reconstructions and MIPs were obtained to evaluate the vascular anatomy. Carotid stenosis measurements (when applicable) are obtained utilizing NASCET criteria, using the distal internal carotid diameter as the denominator. CONTRAST:  65m OMNIPAQUE IOHEXOL 350 MG/ML SOLN COMPARISON:  Noncontrast head CT performed earlier the same day 03/06/2019., MRA neck 04/29/2006. FINDINGS: CT HEAD FINDINGS Brain: There is no evidence of acute intracranial hemorrhage. Calcification within the bilateral basal ganglia. Again demonstrated is extensive patchy and confluent hypodensity within the cerebral white matter and to a lesser degree within the cerebellar white matter, nonspecific but consistent with chronic small vessel ischemic disease. No demarcated cortical infarction is identified. No evidence of intracranial mass. No midline shift or extra-axial fluid collection. Mild generalized parenchymal atrophy. Vascular: No hyperdense vessel. Atherosclerotic calcification of the carotid artery siphons and vertebrobasilar system. Skull: No calvarial fracture. Sinuses: Minimal scattered paranasal sinus mucosal thickening. No significant mastoid effusion Orbits: Visualized orbits demonstrate  no acute abnormality. Review of the MIP images confirms the above findings These results were called by telephone at the time of interpretation on 03/06/2019 at 3:04 pm [facility] who verbally acknowledged these results. CTA NECK FINDINGS Aortic arch: Included portions of the aortic arch demonstrate no evidence of dissection or aneurysm. Prominent soft and calcified plaque within the visualized aortic arch and proximal major branch vessels of the neck. Prominent calcified plaque within the proximal left subclavian artery with either severe stenosis or segmental occlusion of the proximal left subclavian artery. Reconstitution proximal to left vertebral artery takeoff. Right carotid system: Common carotid artery patent without significant stenosis. Prominent calcified plaque at the carotid bifurcation and extending into the proximal ICA. Resultant approximate 40% stenosis of the proximal ICA. Distal to this, the ICA is patent within the neck without significant stenosis. Left carotid system: CCA and ICA patent within the neck without significant stenosis (50% or greater). Scattered soft and calcified plaque. Vertebral arteries: Calcified plaque at the origin of the right vertebral artery with suspected at least moderate ostial stenosis. Calcified plaque at the origin of the left vertebral artery with moderate ostial stenosis. More distally, the bilateral vertebral arteries are otherwise patent within the neck without significant stenosis (50% or greater). Skeleton: Cervical spondylosis with multilevel moderate/severe disc height loss, small posterior disc osteophytes, uncovertebral and facet hypertrophy. C4-C5 grade 1 anterolisthesis. No acute bony abnormality. Other neck: No soft tissue neck mass or pathologically enlarged cervical chain lymph nodes. Thyroid unremarkable. Upper chest: Moderate centrilobular emphysema within the imaged lung apices. Partially visualized pacer leads. Review of the MIP images  confirms the above findings CTA HEAD FINDINGS Anterior circulation: Calcified atherosclerotic disease within the bilateral intracranial internal carotid arteries. Regions of mild stenosis within the intracranial right internal carotid artery. Mild-to-moderate stenosis within the cavernous left ICA. The right middle cerebral artery is patent without significant stenosis. Hypoplastic the A1 right anterior cerebral artery. The A1 left anterior cerebral artery predominantly supplies the bilateral A2 anterior cerebral arteries. Mild-to-moderate focal stenosis within the proximal A2 right anterior cerebral artery. The left anterior cerebral artery is patent without significant proximal stenosis. The left middle cerebral artery is patent without significant proximal stenosis. Posterior circulation: The intracranial vertebral arteries are patent without significant stenosis. Flow is seen within the right PICA. The left PICA is poorly delineated. The basilar artery is patent. Mild focal stenosis within the mid basilar artery. Flow is seen within the proximal left AICA. The right AICA is poorly delineated. Flow is seen within the proximal superior cerebellar arteries bilaterally.  Predominantly fetal origin of the right posterior cerebral artery. The right posterior cerebral artery is patent without significant proximal stenosis. The left posterior cerebral artery is patent without significant proximal stenosis. No intracranial aneurysm is identified Venous sinuses: Within limitations of contrast timing, no convincing thrombus. Anatomic variants: As described. Review of the MIP images confirms the above findings IMPRESSION: CT head: 1. No evidence of acute intracranial hemorrhage or acute demarcated cortical infarction. 2. Generalized parenchymal atrophy with extensive chronic small vessel ischemic disease. CTA head: 1. The left PICA and right AICA are poorly delineated. This may be due to small vessel size. Stenoses within  these vessels cannot be excluded. 2. Intracranial atherosclerotic disease with multifocal stenoses, most notably as follows. 3. Mild-to-moderate focal stenosis within the cavernous left internal carotid artery. 4. Mild-to-moderate focal stenosis within the proximal A2 right anterior cerebral artery. 5. Mild focal stenosis within the mid basilar artery. CTA neck: 1. Extensive calcified plaque results in either segmental occlusion or segmental high-grade stenosis of the proximal left subclavian artery. Reconstitution of the left subclavian artery proximal to left vertebral artery takeoff. Consider carotid artery/vertebral artery duplex for further evaluation to assess for left subclavian steal phenomenon. 2. Bilateral common and internal carotid arteries patent within the neck. Calcified plaque within the proximal right ICA results in 40% stenosis. 3. Plaque at the origins of the bilateral vertebral arteries with at least moderate right ostial stenosis and moderate left ostial stenosis. Distal to this, the bilateral vertebral arteries are patent within the neck without significant stenosis (50% or greater). Electronically Signed   By: Kellie Simmering   On: 03/06/2019 15:40    Procedures Procedures (including critical care time)  Medications Ordered in ED Medications  meclizine (ANTIVERT) tablet 12.5 mg (12.5 mg Oral Given 03/06/19 1105)  ondansetron (ZOFRAN) injection 2 mg (2 mg Intravenous Given 03/06/19 1106)  sodium chloride 0.9 % bolus 500 mL (0 mLs Intravenous Stopped 03/06/19 1250)  0.9 %  sodium chloride infusion ( Intravenous New Bag/Given 03/06/19 1336)  iohexol (OMNIPAQUE) 350 MG/ML injection 100 mL (60 mLs Intravenous Contrast Given 03/06/19 1423)     Initial Impression / Assessment and Plan / ED Course  I have reviewed the triage vital signs and the nursing notes.  Pertinent labs & imaging results that were available during my care of the patient were reviewed by me and considered in my medical  decision making (see chart for details).  Clinical Course as of Mar 05 1616  Wed Oct 07, 764  468 85 year old male with multiple medical problems complaining of acute onset of dizziness room spinning that started around 2 AM this morning.  He says it is improved here but he still having some unsteadiness with ambulation.  He says if he moves his head around gets worse.  Michela Pitcher he had a similar incident a couple years ago.  No nystagmus otherwise normal neuro exam.  Ambulation deferred currently getting some labs head CT EKG and trying some oral meclizine.   [MB]  [place] from MRI: Advises Adapta L ADD R L1 pacemaker not safe for MRI.  Patient's wallet card also shows patient ineligible for MRI.   [BM]    Clinical Course User Index [BM] Deliah Boston, PA-C [MB] Hayden Rasmussen, MD   Pacer interrogation, [employer], no events this week APTT within normal limits PT/INR within normal limits Ethanol negative COVID negative Initial high-sensitivity troponin: 15 Delta high-sensitivity troponin: 17 BMP with creatinine 1.52 and BUN 38, slightly above baseline CBC  within normal limits CBG 150 CT head:  IMPRESSION:  No acute abnormality. Extensive chronic small vessel ischemic  changes in the periventricular white matter. Diffuse mild atrophy.  - On initial evaluation patient overall well-appearing in no acute distress reports improvement of room spinning.  Neuro examination was without deficit.  Patient was also seen by Dr. Melina Copa who agreed with blood work and CT head as above, meclizine and Zofran in the meantime and then reassessment and potential transfer for MRI.  On reassessment patient reports he is feeling much better but still has a slight amount of dizziness, discussed with patient and his daughter about potential for transferring, for MRI, they deliberated and eventually agreed to this.  Unfortunately patient's pacemaker is not compatible with MRI.  Rediscussed case with Dr.  Melina Copa, plan of care is to now consult tele-neurology.  Telemetry neurology was consulted, CT angiograms were ordered with perfusion study.  I called tele-neurology Dr. Micheline Maze back to advise perfusion study not available at this facility, she advised to continue with CT angiogram, plan of care is to admit patient to hospitalist service after CT angiogram was performed as patient will need repeat head CT in 24 hours. - CTA Head/Neck:  IMPRESSION:  CT head:    1. No evidence of acute intracranial hemorrhage or acute demarcated  cortical infarction.    2. Generalized parenchymal atrophy with extensive chronic small  vessel ischemic disease.    CTA head:    1. The left PICA and right AICA are poorly delineated. This may be  due to small vessel size. Stenoses within these vessels cannot be  excluded.  2. Intracranial atherosclerotic disease with multifocal stenoses,  most notably as follows.  3. Mild-to-moderate focal stenosis within the cavernous left  internal carotid artery.  4. Mild-to-moderate focal stenosis within the proximal A2 right  anterior cerebral artery.  5. Mild focal stenosis within the mid basilar artery.    CTA neck:    1. Extensive calcified plaque results in either segmental occlusion  or segmental high-grade stenosis of the proximal left subclavian  artery. Reconstitution of the left subclavian artery proximal to  left vertebral artery takeoff. Consider carotid artery/vertebral  artery duplex for further evaluation to assess for left subclavian  steal phenomenon.  2. Bilateral common and internal carotid arteries patent within the  neck. Calcified plaque within the proximal right ICA results in 40%  stenosis.  3. Plaque at the origins of the bilateral vertebral arteries with at  least moderate right ostial stenosis and moderate left ostial  stenosis. Distal to this, the bilateral vertebral arteries are  patent within the neck without significant stenosis  (50% or  greater).  - Received call from tele-neurologist who advises there is no intervention from a neurological standpoint at this time she recommends consultation to vascular surgery regarding steal syndrome.  Consults placed to both vascular surgery and hospitalist service. - Discussed case with Dr. Tamala Julian from hospitalist service will be admitting patient. - Discussed case with Dr. Donnetta Hutching from vascular surgery who will see patient during admission.  Advises that subclavian steal has been known for multiple years and does not believe this has caused patient's event today. - Patient again reassessed resting comfortably and in no acute distress patient and daughter at bedside updated on results today and plan of care, they state understanding and are agreeable for admission.  No further questions or concerns at this time. - Patient has been admitted to hospitalist service for further evaluation management.  Note: Portions of this report may have been transcribed using voice recognition software. Every effort was made to ensure accuracy; however, inadvertent computerized transcription errors may still be present. Final Clinical Impressions(s) / ED Diagnoses   Final diagnoses:  Dizziness    ED Discharge Orders    None       Gari Crown 03/06/19 1617    Gari Crown 03/06/19 1620    Hayden Rasmussen, MD 03/06/19 [phone]

## 2019-03-06 NOTE — Consult Note (Addendum)
[facility] TeleNeurology Consult Services  Stat Consult  Date of Service:   03/06/2019 13:09:40  Impression:     .  Rule Out Acute Ischemic Stroke  Comments/Sign-Out: Patient is an 85 yo RH male with a PMH of Afib, left CEA, heart block, PPM, DM, HLD, HTN, PVD who p/w vertigo and feeling off balance. LNK 2:00AM. On exam he has mild LLE drift. CTH reviewed, NAF. GIven vertigo with LLE drift rec CTA head and neck, CTP to exclude LVO. Lower clinical suspicion.  CT HEAD: Showed No Acute Hemorrhage or Acute Core Infarct Reviewed  Metrics: [facility] Notification Time: 03/06/2019 13:05:14 Stamp Time: 03/06/2019 13:09:40 Callback Response Time: 03/06/2019 13:10:27 Video Start Time: 03/06/2019 13:23:16 Video End Time: 03/06/2019 13:36:00  Our recommendations are outlined below.  Recommendations:     .  ASA 325mg  in ED  Imaging Studies:     .  MRI Head Without Contrast limited by PPM< repeat CTH in 24 hours to exclude stroke     .  Echocardiogram - Transthoracic Echocardiogram  Therapies:     .  Physical Therapy, Occupational Therapy, Speech Therapy Assessment When Applicable  Other WorkUp:     .  Infectious/metabolic workup per primary team  Disposition: Neurology Follow Up Recommended  Sign Out:     .  Discussed with Emergency Department Provider  ----------------------------------------------------------------------------------------------------  Chief Complaint: dizziness  History of Present Illness: Patient is a 85 year old Male.  Patient is an 85 yo RH male with a PMH of Afib, left CEA, heart block, PPM, DM, HLD, HTN, PVD who p/w vertigo and feeling off balance. LNK last night when he went to bed. He was awakened from sleep at 2:00AM with severe dizziness. When he attempted to walk this AM he felt off balance. No associated speech changes, vision loss, weakness, numbness, diplopia. He has a h/o similar symptoms two years prior. He is unsure  of the diagnosis. He denies a h/o stroke. He is not on [facility] per review of EHR for Afib.   Past Medical History:     . Hypertension     . Hyperlipidemia     . Atrial Fibrillation  Anticoagulant use:  No  Antiplatelet use: ASA Examination: BP(107/75), Pulse(73), Blood Glucose(150) 1A: Level of Consciousness - Alert; keenly responsive + 0 1B: Ask Month and Age - Both Questions Right + 0 1C: Blink Eyes & Squeeze Hands - Performs Both Tasks + 0 2: Test Horizontal Extraocular Movements - Normal + 0 3: Test Visual Fields - No Visual Loss + 0 4: Test Facial Palsy (Use Grimace if Obtunded) - Normal symmetry + 0 5A: Test Left Arm Motor Drift - No Drift for 10 Seconds + 0 5B: Test Right Arm Motor Drift - No Drift for 10 Seconds + 0 6A: Test Left Leg Motor Drift - Drift, but doesn't hit bed + 1 6B: Test Right Leg Motor Drift - No Drift for 5 Seconds + 0 7: Test Limb Ataxia (FNF/Heel-Shin) - No Ataxia + 0 8: Test Sensation - Normal; No sensory loss + 0 9: Test Language/Aphasia - Normal; No aphasia + 0 10: Test Dysarthria - Normal + 0 11: Test Extinction/Inattention - No abnormality + 0  NIHSS Score: 1  Patient/Family was informed the Neurology Consult would happen via TeleHealth consult by way of interactive audio and video telecommunications and consented to receiving care in this manner.  Due to the immediate potential for life-threatening deterioration due to underlying acute neurologic illness, I spent 25 minutes  providing critical care. This time includes time for face to face visit via telemedicine, review of medical records, imaging studies and discussion of findings with providers, the patient and/or family.   Dr Ruffin Frederick   [facility] [phone]   Case [id number]  Addendum: CTA head and neck reviewed.  Results: IMPRESSION: CT head:  1. No evidence of acute intracranial hemorrhage or acute demarcated cortical infarction.  2. Generalized parenchymal atrophy  with extensive chronic small vessel ischemic disease.  CTA head:  1. The left PICA and right AICA are poorly delineated. This may be due to small vessel size. Stenoses within these vessels cannot be excluded. 2. Intracranial atherosclerotic disease with multifocal stenoses, most notably as follows. 3. Mild-to-moderate focal stenosis within the cavernous left internal carotid artery. 4. Mild-to-moderate focal stenosis within the proximal A2 right anterior cerebral artery. 5. Mild focal stenosis within the mid basilar artery.  CTA neck:  1. Extensive calcified plaque results in either segmental occlusion or segmental high-grade stenosis of the proximal left subclavian artery. Reconstitution of the left subclavian artery proximal to left vertebral artery takeoff. Consider carotid artery/vertebral artery duplex for further evaluation to assess for left subclavian steal phenomenon. 2. Bilateral common and internal carotid arteries patent within the neck. Calcified plaque within the proximal right ICA results in 40% stenosis. 3. Plaque at the origins of the bilateral vertebral arteries with at least moderate right ostial stenosis and moderate left ostial stenosis. Distal to this, the bilateral vertebral arteries are patent within the neck without significant stenosis (50% or Greater).  No LVO. NIR not indicated. However, as d/w ED provider recommend STAT vascular consult for subclavian aa high grade stenosis/occlusion.

## 2019-03-06 NOTE — H&P (Signed)
History and Physical    PEDROHENRIQUE Woodard IRS:[id number] DOB: [birth year] DOA: 03/06/2019  PCP: Biagio Borg, MD  Patient coming from: Home.  Chief Complaint: Dizziness.  HPI: Daniel Woodard is a 85 y.o. male with history of complete heart block status post pacemaker placement, hypertension, chronic kidney disease, diabetes mellitus type 2 was brought to the ER after patient has been having persistent dizziness.  Patient symptoms started this morning around 2 AM.  Patient felt dizzy on moving.  Denies any weakness of upper or lower extremities.  Eyes any visual symptoms or any difficulty speaking or swallowing.  Since it is very persistent patient came to the ER.  ED Course: In the ER EKG shows paced rhythm.  Labs reveal creatinine 1.5 potassium 4.5 hemoglobin 14.5 platelet 136.  CT head followed by CT angiogram of the head and neck were done.  Tele neurology was consulted.  Requested to get also CT perfusion study of the brain.  CT angiogram of the head and neck showed occlusion of the proximal left subclavian and also plaques in the vertebral artery.  On-call vascular surgeon Dr. Donnetta Hutching was notified.  Dr. Donnetta Hutching will be seeing patient in consult.  Patient admitted for further management of dizziness.  Review of Systems: As per HPI, rest all negative.   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   BENIGN PROSTATIC HYPERTROPHY 04/09/2007   Blood transfusion without reported diagnosis    really not sure thinks 30-40 years ago   BRADYCARDIA 05/11/2009   Carotid artery occlusion    COLONIC POLYPS, HX OF 04/09/2007   ADENOMATOUS POLYPS 2004,2008   Complete heart block (HCC)    COPD 04/09/2007   Cough 03/27/2008   DEPRESSION 04/09/2007   DIABETES MELLITUS, TYPE II 10/09/2007   DIVERTICULOSIS, COLON 04/09/2007   DIZZINESS 10/06/2008   GLUCOSE INTOLERANCE 04/09/2007    HYPERLIPIDEMIA 03/27/2007   HYPERTENSION 03/27/2007   PERIPHERAL VASCULAR DISEASE 03/27/2007   PNEUMONIA ORGANISM NOS 11/08/2007   SHINGLES, HX OF 04/09/2007   SHOULDER PAIN, BILATERAL 08/01/2007   Ulcer    40 years ago    Past Surgical History:  Procedure Laterality Date   APPENDECTOMY     85 years old   CAROTID ENDARTERECTOMY  5/08   left   CARPAL TUNNEL RELEASE     bilateral   COLONOSCOPY     ESOPHAGOGASTRODUODENOSCOPY  06/07/2004   HEMORRHOID BANDING  1990s   PACEMAKER INSERTION  07/11/2013   MDT ADDRL1 pacemaker implanted by Dr Lovena Le for complete heart block   PERMANENT PACEMAKER INSERTION N/A 07/11/2013   Procedure: PERMANENT PACEMAKER INSERTION;  Surgeon: Evans Lance, MD;  Location: [facility] CATH LAB;  Service: Cardiovascular;  Laterality: N/A;   s/p PUD surgury     ? partial gastrectomy     reports that he quit smoking about 19 years ago. His smoking use included cigarettes. He has a 50.00 pack-year smoking history. He has never used smokeless tobacco. He reports that he does not drink alcohol or use drugs.  Allergies  Allergen Reactions   Ace Inhibitors Anaphylaxis    REACTION: cough REACTION: cough REACTION: cough REACTION: cough    Family History  Problem Relation Age of Onset   Hypertension Father    Ulcers Father    Hypertension Mother    Hypertension Sister    Peripheral vascular disease Brother    Hypertension Sister  Ulcers Sister        Ulcers   Hypertension Sister    Lung cancer Sister        smoked   Heart disease Sister        Carotid    Hypertension Sister    Heart disease Other        Cardiovascular disorder, CHF   Stroke Other        1st degree relative male and male   Colon cancer Neg Hx     Prior to Admission medications   Medication Sig Start Date End Date Taking? Authorizing Provider  ASPIRIN ADULT LOW STRENGTH 81 MG EC tablet TAKE ONE TABLET BY MOUTH ONCE DAILY 02/13/17   Biagio Borg, MD  Coenzyme  Q10 (COQ-10) 200 MG CAPS Take 200 mg by mouth daily.     [provider]  FLUAD 0.5 ML SUSY PHARMACIST ADMINISTERED IMMUNIZATION ADMINISTERED AT TIME OF DISPENSING 03/12/18   [provider]  losartan-hydrochlorothiazide (HYZAAR) 100-25 MG tablet Take 1 tablet by mouth once daily 12/06/18   Biagio Borg, MD  metoprolol succinate (TOPROL-XL) 25 MG 24 hr tablet Take 1 tablet by mouth once daily 12/12/18   Biagio Borg, MD  omeprazole (PRILOSEC) 20 MG capsule Take 1 capsule by mouth once daily 01/17/19   Biagio Borg, MD  psyllium (METAMUCIL) 58.6 % powder Take 2 caps by mouth every day    [provider]  rosuvastatin (CRESTOR) 20 MG tablet TAKE 1 TABLET BY MOUTH AT BEDTIME 01/17/19   Biagio Borg, MD    Physical Exam: Constitutional: Moderately built and nourished. Vitals:   03/06/19 1730 03/06/19 1800 03/06/19 1859 03/06/19 1934  BP: 107/65 90/67 119/86 119/74  Pulse: 60 60 72 68  Resp: _0 Temp:   (!) 97.5 F (36.4 C) 97.6 F (36.4 C)  TempSrc:   Oral Oral  SpO2: 97% 91% 98% 97%   Eyes: Anicteric no pallor. ENMT: No discharge from the ears eyes nose or mouth. Neck: No mass felt.  No neck rigidity. Respiratory: No rhonchi or crepitations. Cardiovascular: S1-S2 heard. Abdomen: Soft nontender bowel sounds present. Musculoskeletal: No edema. Skin: No rash. Neurologic: Alert awake oriented to time place and person.  Moves all extremities 5 x 5.  No facial asymmetry tongue is midline. Psychiatric: Appears normal.   Labs on Admission: I have personally reviewed following labs and imaging studies  CBC: Recent Labs  Lab 03/06/19 1108  WBC 6.9  NEUTROABS 5.1  HGB 15.4  HCT 46.8  MCV 91.1  PLT 314   Basic Metabolic Panel: Recent Labs  Lab 03/06/19 1108  NA 136  K 4.5  CL 102  CO2 23  GLUCOSE 166*  BUN 38*  CREATININE 1.52*  CALCIUM 9.6   GFR: CrCl cannot be calculated (Unknown ideal weight.). Liver Function Tests: No results for  input(s): AST, ALT, ALKPHOS, BILITOT, PROT, ALBUMIN in the last 168 hours. No results for input(s): LIPASE, AMYLASE in the last 168 hours. No results for input(s): AMMONIA in the last 168 hours. Coagulation Profile: Recent Labs  Lab 03/06/19 1335  INR 1.1   Cardiac Enzymes: No results for input(s): CKTOTAL, CKMB, CKMBINDEX, TROPONINI in the last 168 hours. BNP (last 3 results) No results for input(s): PROBNP in the last 8760 hours. HbA1C: No results for input(s): HGBA1C in the last 72 hours. CBG: Recent Labs  Lab 03/06/19 1058  GLUCAP 150*   Lipid Profile: No results for input(s): CHOL,  HDL, LDLCALC, TRIG, CHOLHDL, LDLDIRECT in the last 72 hours. Thyroid Function Tests: No results for input(s): TSH, T4TOTAL, FREET4, T3FREE, THYROIDAB in the last 72 hours. Anemia Panel: No results for input(s): VITAMINB12, FOLATE, FERRITIN, TIBC, IRON, RETICCTPCT in the last 72 hours. Urine analysis:    Component Value Date/Time   COLORURINE YELLOW 03/06/2019 1148   APPEARANCEUR CLEAR 03/06/2019 1148   LABSPEC 1.020 03/06/2019 1148   PHURINE 6.0 03/06/2019 1148   GLUCOSEU NEGATIVE 03/06/2019 1148   GLUCOSEU NEGATIVE 11/15/2018 0733   HGBUR NEGATIVE 03/06/2019 1148   BILIRUBINUR NEGATIVE 03/06/2019 1148   KETONESUR NEGATIVE 03/06/2019 1148   PROTEINUR NEGATIVE 03/06/2019 1148   UROBILINOGEN 0.2 11/15/2018 0733   NITRITE NEGATIVE 03/06/2019 1148   LEUKOCYTESUR NEGATIVE 03/06/2019 1148   Sepsis Labs: _0 (procalcitonin:4,lacticidven:4) ) Recent Results (from the past 240 hour(s))  SARS Coronavirus 2 [facility] order, Performed in [facility] hospital lab) Nasopharyngeal Nasopharyngeal Swab     Status: None   Collection Time: 03/06/19 12:54 PM   Specimen: Nasopharyngeal Swab  Result Value Ref Range Status   SARS Coronavirus 2 NEGATIVE NEGATIVE Final    Comment: (NOTE) If result is NEGATIVE SARS-CoV-2 target nucleic acids are NOT DETECTED. The SARS-CoV-2 RNA is generally  detectable in upper and lower  respiratory specimens during the acute phase of infection. The lowest  concentration of SARS-CoV-2 viral copies this assay can detect is 250  copies / mL. A negative result does not preclude SARS-CoV-2 infection  and should not be used as the sole basis for treatment or other  patient management decisions.  A negative result may occur with  improper specimen collection / handling, submission of specimen other  than nasopharyngeal swab, presence of viral mutation(s) within the  areas targeted by this assay, and inadequate number of viral copies  (<250 copies / mL). A negative result must be combined with clinical  observations, patient history, and epidemiological information. If result is POSITIVE SARS-CoV-2 target nucleic acids are DETECTED. The SARS-CoV-2 RNA is generally detectable in upper and lower  respiratory specimens dur ing the acute phase of infection.  Positive  results are indicative of active infection with SARS-CoV-2.  Clinical  correlation with patient history and other diagnostic information is  necessary to determine patient infection status.  Positive results do  not rule out bacterial infection or co-infection with other viruses. If result is PRESUMPTIVE POSTIVE SARS-CoV-2 nucleic acids MAY BE PRESENT.   A presumptive positive result was obtained on the submitted specimen  and confirmed on repeat testing.  While 2019 novel coronavirus  (SARS-CoV-2) nucleic acids may be present in the submitted sample  additional confirmatory testing may be necessary for epidemiological  and / or clinical management purposes  to differentiate between  SARS-CoV-2 and other Sarbecovirus currently known to infect humans.  If clinically indicated additional testing with an alternate test  methodology [id number]) is advised. The SARS-CoV-2 RNA is generally  detectable in upper and lower respiratory sp ecimens during the acute  phase of infection. The  expected result is Negative. Fact Sheet for Patients:  StrictlyIdeas.no Fact Sheet for Healthcare Providers: BankingDealers.co.za This test is not yet approved or cleared by the [place] FDA and has been authorized for detection and/or diagnosis of SARS-CoV-2 by FDA under an Emergency Use Authorization (EUA).  This EUA will remain in effect (meaning this test can be used) for the duration of the COVID-19 declaration under Section 564(b)(1) of the Act, 21 U.S.C. section 360bbb-3(b)(1), unless the authorization is terminated or  revoked sooner. Performed at [facility], [address] [place], [place] [address]      Radiological Exams on Admission: Ct Code Stroke Cta Head W/wo Contrast  Result Date: 03/06/2019 CLINICAL DATA:  Vertigo, persistent, central. Additional history provided: Dizziness upon waking this morning, nausea. Per provider, patient not a stroke code. EXAM: CT ANGIOGRAPHY HEAD AND NECK TECHNIQUE: Multidetector CT imaging of the head and neck was performed using the standard protocol during bolus administration of intravenous contrast. Multiplanar CT image reconstructions and MIPs were obtained to evaluate the vascular anatomy. Carotid stenosis measurements (when applicable) are obtained utilizing NASCET criteria, using the distal internal carotid diameter as the denominator. CONTRAST:  37m OMNIPAQUE IOHEXOL 350 MG/ML SOLN COMPARISON:  Noncontrast head CT performed earlier the same day 03/06/2019., MRA neck 04/29/2006. FINDINGS: CT HEAD FINDINGS Brain: There is no evidence of acute intracranial hemorrhage. Calcification within the bilateral basal ganglia. Again demonstrated is extensive patchy and confluent hypodensity within the cerebral white matter and to a lesser degree within the cerebellar white matter, nonspecific but consistent with chronic small vessel ischemic disease. No demarcated cortical infarction is  identified. No evidence of intracranial mass. No midline shift or extra-axial fluid collection. Mild generalized parenchymal atrophy. Vascular: No hyperdense vessel. Atherosclerotic calcification of the carotid artery siphons and vertebrobasilar system. Skull: No calvarial fracture. Sinuses: Minimal scattered paranasal sinus mucosal thickening. No significant mastoid effusion Orbits: Visualized orbits demonstrate no acute abnormality. Review of the MIP images confirms the above findings These results were called by telephone at the time of interpretation on 03/06/2019 at 3:04 pm [facility] verb[facility] these results. CTA NECK FINDINGS Aortic arch: Included portions of the aortic arch demonstrate no evidence of dissection or aneurysm. Prominent soft and calcified plaque within the visualized aortic arch and proximal major branch vessels of the neck. Prominent calcified plaque within the proximal left subclavian artery with either severe stenosis or segmental occlusion of the proximal left subclavian artery. Reconstitution proximal to left vertebral artery takeoff. Right carotid system: Common carotid artery patent without significant stenosis. Prominent calcified plaque at the carotid bifurcation and extending into the proximal ICA. Resultant approximate 40% stenosis of the proximal ICA. Distal to this, the ICA is patent within the neck without significant stenosis. Left carotid system: CCA and ICA patent within the neck without significant stenosis (50% or greater). Scattered soft and calcified plaque. Vertebral arteries: Calcified plaque at the origin of the right vertebral artery with suspected at least moderate ostial stenosis. Calcified plaque at the origin of the left vertebral artery with moderate ostial stenosis. More distally, the bilateral vertebral arteries are otherwise patent within the neck without significant stenosis (50% or greater). Skeleton: Cervical spondylosis with  multilevel moderate/severe disc height loss, small posterior disc osteophytes, uncovertebral and facet hypertrophy. C4-C5 grade 1 anterolisthesis. No acute bony abnormality. Other neck: No soft tissue neck mass or pathologically enlarged cervical chain lymph nodes. Thyroid unremarkable. Upper chest: Moderate centrilobular emphysema within the imaged lung apices. Partially visualized pacer leads. Review of the MIP images confirms the above findings CTA HEAD FINDINGS Anterior circulation: Calcified atherosclerotic disease within the bilateral intracranial internal carotid arteries. Regions of mild stenosis within the intracranial right internal carotid artery. Mild-to-moderate stenosis within the cavernous left ICA. The right middle cerebral artery is patent without significant stenosis. Hypoplastic the A1 right anterior cerebral artery. The A1 left anterior cerebral artery predominantly supplies the bilateral A2 anterior cerebral arteries. Mild-to-moderate focal stenosis within the proximal A2  right anterior cerebral artery. The left anterior cerebral artery is patent without significant proximal stenosis. The left middle cerebral artery is patent without significant proximal stenosis. Posterior circulation: The intracranial vertebral arteries are patent without significant stenosis. Flow is seen within the right PICA. The left PICA is poorly delineated. The basilar artery is patent. Mild focal stenosis within the mid basilar artery. Flow is seen within the proximal left AICA. The right AICA is poorly delineated. Flow is seen within the proximal superior cerebellar arteries bilaterally. Predominantly fetal origin of the right posterior cerebral artery. The right posterior cerebral artery is patent without significant proximal stenosis. The left posterior cerebral artery is patent without significant proximal stenosis. No intracranial aneurysm is identified Venous sinuses: Within limitations of contrast timing, no  convincing thrombus. Anatomic variants: As described. Review of the MIP images confirms the above findings IMPRESSION: CT head: 1. No evidence of acute intracranial hemorrhage or acute demarcated cortical infarction. 2. Generalized parenchymal atrophy with extensive chronic small vessel ischemic disease. CTA head: 1. The left PICA and right AICA are poorly delineated. This may be due to small vessel size. Stenoses within these vessels cannot be excluded. 2. Intracranial atherosclerotic disease with multifocal stenoses, most notably as follows. 3. Mild-to-moderate focal stenosis within the cavernous left internal carotid artery. 4. Mild-to-moderate focal stenosis within the proximal A2 right anterior cerebral artery. 5. Mild focal stenosis within the mid basilar artery. CTA neck: 1. Extensive calcified plaque results in either segmental occlusion or segmental high-grade stenosis of the proximal left subclavian artery. Reconstitution of the left subclavian artery proximal to left vertebral artery takeoff. Consider carotid artery/vertebral artery duplex for further evaluation to assess for left subclavian steal phenomenon. 2. Bilateral common and internal carotid arteries patent within the neck. Calcified plaque within the proximal right ICA results in 40% stenosis. 3. Plaque at the origins of the bilateral vertebral arteries with at least moderate right ostial stenosis and moderate left ostial stenosis. Distal to this, the bilateral vertebral arteries are patent within the neck without significant stenosis (50% or greater). Electronically Signed   By: Kellie Simmering   On: 03/06/2019 15:40   Ct Head Wo Contrast  Result Date: 03/06/2019 CLINICAL DATA:  Dizziness and nausea. Vertiginous syndrome. EXAM: CT HEAD WITHOUT CONTRAST TECHNIQUE: Contiguous axial images were obtained from the base of the skull through the vertex without intravenous contrast. COMPARISON:  None. FINDINGS: Brain: No evidence of acute infarction,  hemorrhage, hydrocephalus, extra-axial collection or mass lesion/mass effect. Diffuse mild cerebral cortical atrophy. No ventricular dilatation. Extensive periventricular white matter lucency consistent with chronic small vessel ischemic change. Vascular: No hyperdense vessel or unexpected calcification. Skull: Normal. Negative for fracture or focal lesion. Sinuses/Orbits: No acute finding. Evidence of bilateral eye surgery. Other: None. IMPRESSION: No acute abnormality. Extensive chronic small vessel ischemic changes in the periventricular white matter. Diffuse mild atrophy. Electronically Signed   By: Lorriane Shire M.D.   On: 03/06/2019 11:17   Ct Code Stroke Cta Neck W/wo Contrast  Result Date: 03/06/2019 CLINICAL DATA:  Vertigo, persistent, central. Additional history provided: Dizziness upon waking this morning, nausea. Per provider, patient not a stroke code. EXAM: CT ANGIOGRAPHY HEAD AND NECK TECHNIQUE: Multidetector CT imaging of the head and neck was performed using the standard protocol during bolus administration of intravenous contrast. Multiplanar CT image reconstructions and MIPs were obtained to evaluate the vascular anatomy. Carotid stenosis measurements (when applicable) are obtained utilizing NASCET criteria, using the distal internal carotid diameter as the denominator. CONTRAST:  74m OMNIPAQUE IOHEXOL 350 MG/ML SOLN COMPARISON:  Noncontrast head CT performed earlier the same day 03/06/2019., MRA neck 04/29/2006. FINDINGS: CT HEAD FINDINGS Brain: There is no evidence of acute intracranial hemorrhage. Calcification within the bilateral basal ganglia. Again demonstrated is extensive patchy and confluent hypodensity within the cerebral white matter and to a lesser degree within the cerebellar white matter, nonspecific but consistent with chronic small vessel ischemic disease. No demarcated cortical infarction is identified. No evidence of intracranial mass. No midline shift or extra-axial fluid  collection. Mild generalized parenchymal atrophy. Vascular: No hyperdense vessel. Atherosclerotic calcification of the carotid artery siphons and vertebrobasilar system. Skull: No calvarial fracture. Sinuses: Minimal scattered paranasal sinus mucosal thickening. No significant mastoid effusion Orbits: Visualized orbits demonstrate no acute abnormality. Review of the MIP images confirms the above findings These results were called by telephone at the time of interpretation on 03/06/2019 at 3:04 pm [facility] verb[facility] these results. CTA NECK FINDINGS Aortic arch: Included portions of the aortic arch demonstrate no evidence of dissection or aneurysm. Prominent soft and calcified plaque within the visualized aortic arch and proximal major branch vessels of the neck. Prominent calcified plaque within the proximal left subclavian artery with either severe stenosis or segmental occlusion of the proximal left subclavian artery. Reconstitution proximal to left vertebral artery takeoff. Right carotid system: Common carotid artery patent without significant stenosis. Prominent calcified plaque at the carotid bifurcation and extending into the proximal ICA. Resultant approximate 40% stenosis of the proximal ICA. Distal to this, the ICA is patent within the neck without significant stenosis. Left carotid system: CCA and ICA patent within the neck without significant stenosis (50% or greater). Scattered soft and calcified plaque. Vertebral arteries: Calcified plaque at the origin of the right vertebral artery with suspected at least moderate ostial stenosis. Calcified plaque at the origin of the left vertebral artery with moderate ostial stenosis. More distally, the bilateral vertebral arteries are otherwise patent within the neck without significant stenosis (50% or greater). Skeleton: Cervical spondylosis with multilevel moderate/severe disc height loss, small posterior disc osteophytes,  uncovertebral and facet hypertrophy. C4-C5 grade 1 anterolisthesis. No acute bony abnormality. Other neck: No soft tissue neck mass or pathologically enlarged cervical chain lymph nodes. Thyroid unremarkable. Upper chest: Moderate centrilobular emphysema within the imaged lung apices. Partially visualized pacer leads. Review of the MIP images confirms the above findings CTA HEAD FINDINGS Anterior circulation: Calcified atherosclerotic disease within the bilateral intracranial internal carotid arteries. Regions of mild stenosis within the intracranial right internal carotid artery. Mild-to-moderate stenosis within the cavernous left ICA. The right middle cerebral artery is patent without significant stenosis. Hypoplastic the A1 right anterior cerebral artery. The A1 left anterior cerebral artery predominantly supplies the bilateral A2 anterior cerebral arteries. Mild-to-moderate focal stenosis within the proximal A2 right anterior cerebral artery. The left anterior cerebral artery is patent without significant proximal stenosis. The left middle cerebral artery is patent without significant proximal stenosis. Posterior circulation: The intracranial vertebral arteries are patent without significant stenosis. Flow is seen within the right PICA. The left PICA is poorly delineated. The basilar artery is patent. Mild focal stenosis within the mid basilar artery. Flow is seen within the proximal left AICA. The right AICA is poorly delineated. Flow is seen within the proximal superior cerebellar arteries bilaterally. Predominantly fetal origin of the right posterior cerebral artery. The right posterior cerebral artery is patent without significant proximal stenosis. The left posterior cerebral artery is patent without significant proximal stenosis. No  intracranial aneurysm is identified Venous sinuses: Within limitations of contrast timing, no convincing thrombus. Anatomic variants: As described. Review of the MIP images  confirms the above findings IMPRESSION: CT head: 1. No evidence of acute intracranial hemorrhage or acute demarcated cortical infarction. 2. Generalized parenchymal atrophy with extensive chronic small vessel ischemic disease. CTA head: 1. The left PICA and right AICA are poorly delineated. This may be due to small vessel size. Stenoses within these vessels cannot be excluded. 2. Intracranial atherosclerotic disease with multifocal stenoses, most notably as follows. 3. Mild-to-moderate focal stenosis within the cavernous left internal carotid artery. 4. Mild-to-moderate focal stenosis within the proximal A2 right anterior cerebral artery. 5. Mild focal stenosis within the mid basilar artery. CTA neck: 1. Extensive calcified plaque results in either segmental occlusion or segmental high-grade stenosis of the proximal left subclavian artery. Reconstitution of the left subclavian artery proximal to left vertebral artery takeoff. Consider carotid artery/vertebral artery duplex for further evaluation to assess for left subclavian steal phenomenon. 2. Bilateral common and internal carotid arteries patent within the neck. Calcified plaque within the proximal right ICA results in 40% stenosis. 3. Plaque at the origins of the bilateral vertebral arteries with at least moderate right ostial stenosis and moderate left ostial stenosis. Distal to this, the bilateral vertebral arteries are patent within the neck without significant stenosis (50% or greater). Electronically Signed   By: Kellie Simmering   On: 03/06/2019 15:40    EKG: Independently reviewed.  Paced rhythm.  Assessment/Plan Principal Problem:   Dizziness Active Problems:   Diabetes ([place])   Hyperlipidemia   Essential hypertension   COPD GOLD II/ emphysematous features   Pacemaker    1. Dizziness -cause not clear.  CT perfusion studies pending.  Check orthostatics and get physical therapy consult.  Patient's dizziness is positional.  May be peripheral.   Unable to do MRI brain due to pacemaker. 2. 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 Donnetta Hutching has been consulted. 3. Hypertension on losartan.  Will hold hydrochlorothiazide for now.  Also on beta-blocker. 4. History of complete block status post pacemaker placement. 5. Chronic kidney disease stage III creatinine appears to be at baseline. 6. Mild thrombocytopenia appears to be new follow CBC.   DVT prophylaxis: Lovenox. Code Status: DNR. Family Communication: Discussed with patient. Disposition Plan: Home. Consults called: Tele neurology.  Vascular surgeon. Admission status: Observation.   Rise Patience MD [facility] Pager [phone].  If 7PM-7AM, please contact night-coverage [email or web address] Password [email or web address]  03/06/2019, 8:33 PM

## 2019-03-06 NOTE — ED Triage Notes (Signed)
Pt here with vertigo starting this morning that woke him from sleep. This happened before many years ago. No other sx present.

## 2019-03-07 ENCOUNTER — Encounter (HOSPITAL_COMMUNITY): Payer: Medicare Other

## 2019-03-07 ENCOUNTER — Observation Stay (HOSPITAL_BASED_OUTPATIENT_CLINIC_OR_DEPARTMENT_OTHER): Payer: Medicare Other

## 2019-03-07 DIAGNOSIS — G458 Other transient cerebral ischemic attacks and related syndromes: Secondary | ICD-10-CM | POA: Diagnosis not present

## 2019-03-07 DIAGNOSIS — I6522 Occlusion and stenosis of left carotid artery: Secondary | ICD-10-CM | POA: Diagnosis not present

## 2019-03-07 DIAGNOSIS — R55 Syncope and collapse: Secondary | ICD-10-CM | POA: Diagnosis not present

## 2019-03-07 DIAGNOSIS — R42 Dizziness and giddiness: Secondary | ICD-10-CM | POA: Diagnosis not present

## 2019-03-07 DIAGNOSIS — I342 Nonrheumatic mitral (valve) stenosis: Secondary | ICD-10-CM | POA: Diagnosis not present

## 2019-03-07 LAB — ECHOCARDIOGRAM COMPLETE

## 2019-03-07 LAB — COMPREHENSIVE METABOLIC PANEL
ALT: 16 U/L (ref 0–44)
AST: 16 U/L (ref 15–41)
Albumin: 3.5 g/dL (ref 3.5–5.0)
Alkaline Phosphatase: 59 U/L (ref 38–126)
Anion gap: 11 (ref 5–15)
BUN: 32 mg/dL — ABNORMAL HIGH (ref 8–23)
CO2: 24 mmol/L (ref 22–32)
Calcium: 8.9 mg/dL (ref 8.9–10.3)
Chloride: 103 mmol/L (ref 98–111)
Creatinine, Ser: 1.55 mg/dL — ABNORMAL HIGH (ref 0.61–1.24)
GFR calc Af Amer: 46 mL/min — ABNORMAL LOW (ref >60)
GFR calc non Af Amer: 40 mL/min — ABNORMAL LOW (ref >60)
Glucose, Bld: 110 mg/dL — ABNORMAL HIGH (ref 70–99)
Potassium: 4.4 mmol/L (ref 3.5–5.1)
Sodium: 138 mmol/L (ref 135–145)
Total Bilirubin: 1.2 mg/dL (ref 0.3–1.2)
Total Protein: 6.8 g/dL (ref 6.5–8.1)

## 2019-03-07 LAB — GLUCOSE, CAPILLARY
Glucose-Capillary: 108 mg/dL — ABNORMAL HIGH (ref 70–99)
Glucose-Capillary: 129 mg/dL — ABNORMAL HIGH (ref 70–99)

## 2019-03-07 LAB — LIPID PANEL
Cholesterol: 146 mg/dL (ref 0–200)
HDL: 25 mg/dL — ABNORMAL LOW (ref >40)
LDL Cholesterol: 91 mg/dL (ref 0–99)
Total CHOL/HDL Ratio: 5.8 RATIO
Triglycerides: 148 mg/dL (ref <150)
VLDL: 30 mg/dL (ref 0–40)

## 2019-03-07 LAB — HEMOGLOBIN A1C
Hgb A1c MFr Bld: 6.4 % — ABNORMAL HIGH (ref 4.8–5.6)
Mean Plasma Glucose: 136.98 mg/dL

## 2019-03-07 LAB — CBC
HCT: 42.2 % (ref 39.0–52.0)
Hemoglobin: 14.4 g/dL (ref 13.0–17.0)
MCH: 30.8 pg (ref 26.0–34.0)
MCHC: 34.1 g/dL (ref 30.0–36.0)
MCV: 90.2 fL (ref 80.0–100.0)
Platelets: 140 10*3/uL — ABNORMAL LOW (ref 150–400)
RBC: 4.68 MIL/uL (ref 4.22–5.81)
RDW: 13.2 % (ref 11.5–15.5)
WBC: 7.7 10*3/uL (ref 4.0–10.5)
nRBC: 0 % (ref 0.0–0.2)

## 2019-03-07 MED ORDER — METOPROLOL SUCCINATE ER 25 MG PO TB24: 12.5000 mg | ORAL_TABLET | Freq: Every day | ORAL | 0 refills | Status: DC

## 2019-03-07 MED ORDER — LOSARTAN POTASSIUM 50 MG PO TABS
100.0000 mg | ORAL_TABLET | Freq: Every day | ORAL | Status: DC
  Administered 2019-03-07: 100 mg via ORAL
  Filled 2019-03-07: qty 2

## 2019-03-07 MED ORDER — ASPIRIN 325 MG PO TABS: 325.0000 mg | ORAL_TABLET | Freq: Every day | ORAL | Status: DC

## 2019-03-07 NOTE — Evaluation (Signed)
Occupational Therapy Evaluation Patient Details Name: Daniel Woodard MRN: [id number] DOB: 1930/05/26 Today's Date: 03/07/2019    History of Present Illness Daniel Woodard is a 85 y.o. male with history of complete heart block status post pacemaker placement, hypertension, chronic kidney disease, diabetes mellitus type 2 was brought to the ER after patient has been having persistent dizziness CTA head/neck: Extensive calcified plaque results in either segmental occlusion or segmental high-grade stenosis of the proximal left subclavian   Clinical Impression   Pt PTA: living alone and performing own mobility and ADL tasks. Pt currently performing mobility with no AD, fair balance and ADL with increased time, but no physical assist required. Pt with no dizziness and pt is not orthostatic. Pt's vitals in vital flow sheets. Pt A/O x4 and no difficulty with functional tasks. Pt does not require continued OT skilled services. OT signing off.      Follow Up Recommendations  No OT follow up    Equipment Recommendations  None recommended by OT    Recommendations for Other Services       Precautions / Restrictions Precautions Precautions: None Restrictions Weight Bearing Restrictions: No      Mobility Bed Mobility Overal bed mobility: Independent                Transfers Overall transfer level: Independent Equipment used: None                  Balance Overall balance assessment: Mild deficits observed, not formally tested                                         ADL either performed or assessed with clinical judgement   ADL Overall ADL's : At baseline;Modified independent                                       General ADL Comments: Pt required no physical assist for LB ADL at bedside and at sink in standing     Vision Baseline Vision/History: No visual deficits Vision Assessment?: No apparent visual deficits     Perception     Praxis      Pertinent Vitals/Pain Pain Assessment: No/denies pain     Hand Dominance Right   Extremity/Trunk Assessment Upper Extremity Assessment Upper Extremity Assessment: Overall WFL for tasks assessed   Lower Extremity Assessment Lower Extremity Assessment: Overall WFL for tasks assessed   Cervical / Trunk Assessment Cervical / Trunk Assessment: Normal   Communication Communication Communication: No difficulties   Cognition Arousal/Alertness: Awake/alert Behavior During Therapy: WFL for tasks assessed/performed Overall Cognitive Status: Within Functional Limits for tasks assessed                                     General Comments  No dizziness reported.     Exercises     Shoulder Instructions      Home Living Family/patient expects to be discharged to:: Private residence Living Arrangements: Alone Available Help at Discharge: Family;Available PRN/intermittently Type of Home: House Home Access: Level entry     Home Layout: One level     Bathroom Shower/Tub: Walk-in shower         Home Equipment: Environmental consultant - 2 wheels;Wheelchair -  manual;Shower seat          Prior Functioning/Environment Level of Independence: Independent                 OT Problem List:        OT Treatment/Interventions:      OT Goals(Current goals can be found in the care plan section) Acute Rehab OT Goals Patient Stated Goal: likes playing golf and fishing  OT Frequency:     Barriers to D/C:            Co-evaluation PT/OT/SLP Co-Evaluation/Treatment: Yes Reason for Co-Treatment: For patient/therapist safety   OT goals addressed during session: ADL's and self-care      AM-PAC OT "6 Clicks" Daily Activity     Outcome Measure Help from another person eating meals?: None Help from another person taking care of personal grooming?: None Help from another person toileting, which includes using toliet, bedpan, or urinal?: None Help from another  person bathing (including washing, rinsing, drying)?: A Little Help from another person to put on and taking off regular upper body clothing?: None Help from another person to put on and taking off regular lower body clothing?: None 6 Click Score: 23   End of Session Equipment Utilized During Treatment: Gait belt Nurse Communication: Mobility status  Activity Tolerance: Patient tolerated treatment well Patient left: in chair;with call bell/phone within reach;with chair alarm set  OT Visit Diagnosis: Unsteadiness on feet (R26.81)                Time: [id number] OT Time Calculation (min): 26 min Charges:  OT General Charges $OT Visit: 1 Visit OT Evaluation $OT Eval Moderate Complexity: 1 Mod  Darryl Nestle) Marsa Aris OTR/L Acute Rehabilitation Services Pager: [phone] Office: [place] 03/07/2019, 1:28 PM

## 2019-03-07 NOTE — Progress Notes (Signed)
Patient being discharged home with self care. Education and instructions provided to patient. All belongings with patient. IV removed. CCMD notified. Pt leaving unit via wheelchair.

## 2019-03-07 NOTE — Consult Note (Signed)
Stroke Neurology Consultation Note  Consult Requested by: Dr. Eliseo Squires  Reason for Consult: Dizziness  Consult Date:  03/07/19  The history was obtained from the pt and daughter.  During history and examination, all items were able to obtain unless otherwise noted.  History of Present Illness:  Daniel Woodard is an 85 y.o. Caucasian male with PMH of HTN, HLD, DM, complete heart block status post pacemaker, left CEA, chronic left subclavian occlusion with left VA retrograde flow presented for dizziness.  As per patient, he was turning in bed at 2 AM at night, started to have sudden onset of dizziness.  He did not describe as vertigo but really dizzy.  The dizziness lasting about 2 minutes and then eased off, however he still did not feel back to normal.  He got up in the morning and feeling imbalance on walking, and sat down, called his daughter.  His daughter came to his house, found him pale in color, not himself.  Sent him to ER, as per daughter, as soon as IV fluid bolus started, his face became red, and was back to his normal self and asking for going home.  Telemetry neurology consulted, CTA head and neck showed left subclavian artery occlusion, patient transferred to [facility] for further evaluation.  Patient denies any arm or leg weakness, nausea vomiting, loss of consciousness, or seizure.  As per daughter, patient not drinking as much as he is supposed to, and believe him to be dehydrated.  Date last known well: Date: 03/06/2019 Time last known well: Time: 02:00 tPA Given: No: Outside window MRS: 1 NIHSS:  1  Past Medical History:  Diagnosis Date  . ALLERGIC RHINITIS 04/09/2007  . Atrial fibrillation ([place])   . BACTEREMIA, 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  . BENIGN PROSTATIC HYPERTROPHY 04/09/2007  . Blood transfusion without reported diagnosis    really not sure thinks 30-40 years ago  . BRADYCARDIA 05/11/2009  . Carotid artery occlusion   . COLONIC POLYPS, HX OF 04/09/2007    ADENOMATOUS POLYPS 4158,3094  . Complete heart block ([place])   . COPD 04/09/2007  . Cough 03/27/2008  . DEPRESSION 04/09/2007  . DIABETES MELLITUS, TYPE II 10/09/2007  . DIVERTICULOSIS, COLON 04/09/2007  . DIZZINESS 10/06/2008  . GLUCOSE INTOLERANCE 04/09/2007  . HYPERLIPIDEMIA 03/27/2007  . HYPERTENSION 03/27/2007  . PERIPHERAL VASCULAR DISEASE 03/27/2007  . PNEUMONIA ORGANISM NOS 11/08/2007  . SHINGLES, HX OF 04/09/2007  . SHOULDER PAIN, BILATERAL 08/01/2007  . Ulcer    40 years ago     Past Surgical History:  Procedure Laterality Date  . APPENDECTOMY     85 years old  . CAROTID ENDARTERECTOMY  5/08   left  . CARPAL TUNNEL RELEASE     bilateral  . COLONOSCOPY    . ESOPHAGOGASTRODUODENOSCOPY  06/07/2004  . HEMORRHOID BANDING  1990s  . PACEMAKER INSERTION  07/11/2013   MDT ADDRL1 pacemaker implanted by Dr Lovena Le for complete heart block  . PERMANENT PACEMAKER INSERTION N/A 07/11/2013   Procedure: PERMANENT PACEMAKER INSERTION;  Surgeon: Evans Lance, MD;  Location: [facility] CATH LAB;  Service: Cardiovascular;  Laterality: N/A;  . s/p PUD surgury     ? partial gastrectomy    Family History  Problem Relation Age of Onset  . Hypertension Father   . Ulcers Father   . Hypertension Mother   . Hypertension Sister   . Peripheral vascular disease Brother   . Hypertension Sister   . Ulcers Sister  Ulcers  . Hypertension Sister   . Lung cancer Sister        smoked  . Heart disease Sister        Carotid   . Hypertension Sister   . Heart disease Other        Cardiovascular disorder, CHF  . Stroke Other        1st degree relative male and male  . Colon cancer Neg Hx      Social History:  reports that he quit smoking about 19 years ago. His smoking use included cigarettes. He has a 50.00 pack-year smoking history. He has never used smokeless tobacco. He reports that he does not drink alcohol or use drugs.  Review of Systems: A full ROS was attempted today and was able to be  performed.  Systems assessed include - Constitutional, Eyes, HENT, Respiratory, Cardiovascular, Gastrointestinal, Genitourinary, Integument/breast, Hematologic/lymphatic, Musculoskeletal, Neurological, Behavioral/Psych, Endocrine, Allergic/Immunologic - with pertinent responses as per HPI.  Allergies:  Allergies  Allergen Reactions  . Ace Inhibitors Anaphylaxis    REACTION: cough REACTION: cough REACTION: cough REACTION: cough     Test Results: CBC:  Recent Labs  Lab 03/06/19 1108 03/06/19 2045 03/07/19 0322  WBC 6.9 7.6 7.7  NEUTROABS 5.1  --   --   HGB 15.4 14.5 14.4  HCT 46.8 42.0 42.2  MCV 91.1 90.1 90.2  PLT 153 136* 427*   Basic Metabolic Panel:  Recent Labs  Lab 03/06/19 1108 03/06/19 2045 03/07/19 0322  NA 136  --  138  K 4.5  --  4.4  CL 102  --  103  CO2 23  --  24  GLUCOSE 166*  --  110*  BUN 38*  --  32*  CREATININE 1.52* 1.65* 1.55*  CALCIUM 9.6  --  8.9   Liver Function Tests: Recent Labs  Lab 03/07/19 0322  AST 16  ALT 16  ALKPHOS 59  BILITOT 1.2  PROT 6.8  ALBUMIN 3.5   No results for input(s): LIPASE, AMYLASE in the last 168 hours. No results for input(s): AMMONIA in the last 168 hours. Coagulation Studies:  Recent Labs    03/06/19 1335  LABPROT 13.6  INR 1.1   Cardiac Enzymes: No results for input(s): CKTOTAL, CKMB, CKMBINDEX, TROPONINI in the last 168 hours. BNP: Invalid input(s): POCBNP CBG:  Recent Labs  Lab 03/06/19 1058 03/06/19 2127 03/07/19 0615  GLUCAP 150* 97 108*   Urinalysis:  Recent Labs  Lab 03/06/19 1148  COLORURINE YELLOW  LABSPEC 1.020  PHURINE 6.0  GLUCOSEU NEGATIVE  HGBUR NEGATIVE  BILIRUBINUR NEGATIVE  KETONESUR NEGATIVE  PROTEINUR NEGATIVE  NITRITE NEGATIVE  LEUKOCYTESUR NEGATIVE   Microbiology:   Lipid Panel:     Component Value Date/Time   CHOL 146 03/07/2019 0322   TRIG 148 03/07/2019 0322   HDL 25 (L) 03/07/2019 0322   CHOLHDL 5.8 03/07/2019 0322   VLDL 30 03/07/2019 0322    LDLCALC 91 03/07/2019 0322   HgbA1c:  Lab Results  Component Value Date   HGBA1C 6.4 (H) 03/07/2019   Urine Drug Screen: No results found for: LABOPIA, COCAINSCRNUR, LABBENZ, AMPHETMU, THCU, LABBARB  Alcohol Level:  Recent Labs  Lab 03/06/19 [place] <10    EKG: paced rhythm.  Physical Examination: Temp:  [97.5 F (36.4 C)-97.8 F (36.6 C)] 97.7 F (36.5 C) (10/08 1134) Pulse Rate:  [60-82] 70 (10/08 1134) Resp:  [14-20] 16 (10/08 1134) BP: (90-136)/(65-97) 105/71 (10/08 1134) SpO2:  [91 %-98 %] 96 % (  10/08 1134)  General - Well nourished, well developed, in no apparent distress.  Ophthalmologic - fundi not visualized due to noncooperation.  Cardiovascular - Regular rate and rhythm.  Mental Status -  Level of arousal and orientation to time, place, and person were intact. Language including expression, naming, repetition, comprehension was assessed and found intact. Fund of Knowledge was assessed and was intact.  Cranial Nerves II - XII - II - Visual field intact OU. III, IV, VI - Extraocular movements intact. V - Facial sensation intact bilaterally. VII - Facial movement intact bilaterally. VIII - Hearing & vestibular intact bilaterally. X - Palate elevates symmetrically. XI - Chin turning & shoulder shrug intact bilaterally. XII - Tongue protrusion intact.  Motor Strength - The patient's strength was normal in all extremities and pronator drift was absent.  Bulk was normal and fasciculations were absent.   Motor Tone - Muscle tone was assessed at the neck and appendages and was normal.  Reflexes - The patient's reflexes were symmetrical in all extremities and he had no pathological reflexes.  Sensory - Light touch, temperature/pinprick were assessed and were symmetrical.    Coordination - The patient had normal movements in the hands with no ataxia or dysmetria.  Tremor was absent.  Dix-Hallpike maneuver negative  Gait and Station - deferred.    Data  Reviewed: Ct Code Stroke Cta Head W/wo Contrast  Result Date: 03/06/2019 CLINICAL DATA:  Vertigo, persistent, central. Additional history provided: Dizziness upon waking this morning, nausea. Per provider, patient not a stroke code. EXAM: CT ANGIOGRAPHY HEAD AND NECK TECHNIQUE: Multidetector CT imaging of the head and neck was performed using the standard protocol during bolus administration of intravenous contrast. Multiplanar CT image reconstructions and MIPs were obtained to evaluate the vascular anatomy. Carotid stenosis measurements (when applicable) are obtained utilizing NASCET criteria, using the distal internal carotid diameter as the denominator. CONTRAST:  27m OMNIPAQUE IOHEXOL 350 MG/ML SOLN COMPARISON:  Noncontrast head CT performed earlier the same day 03/06/2019., MRA neck 04/29/2006. FINDINGS: CT HEAD FINDINGS Brain: There is no evidence of acute intracranial hemorrhage. Calcification within the bilateral basal ganglia. Again demonstrated is extensive patchy and confluent hypodensity within the cerebral white matter and to a lesser degree within the cerebellar white matter, nonspecific but consistent with chronic small vessel ischemic disease. No demarcated cortical infarction is identified. No evidence of intracranial mass. No midline shift or extra-axial fluid collection. Mild generalized parenchymal atrophy. Vascular: No hyperdense vessel. Atherosclerotic calcification of the carotid artery siphons and vertebrobasilar system. Skull: No calvarial fracture. Sinuses: Minimal scattered paranasal sinus mucosal thickening. No significant mastoid effusion Orbits: Visualized orbits demonstrate no acute abnormality. Review of the MIP images confirms the above findings These results were called by telephone at the time of interpretation on 03/06/2019 at 3:04 pm [facility] verbally acknowledged these results. CTA NECK FINDINGS Aortic arch: Included portions of the aortic arch demonstrate  no evidence of dissection or aneurysm. Prominent soft and calcified plaque within the visualized aortic arch and proximal major branch vessels of the neck. Prominent calcified plaque within the proximal left subclavian artery with either severe stenosis or segmental occlusion of the proximal left subclavian artery. Reconstitution proximal to left vertebral artery takeoff. Right carotid system: Common carotid artery patent without significant stenosis. Prominent calcified plaque at the carotid bifurcation and extending into the proximal ICA. Resultant approximate 40% stenosis of the proximal ICA. Distal to this, the ICA is patent within the neck without significant stenosis. Left carotid  system: CCA and ICA patent within the neck without significant stenosis (50% or greater). Scattered soft and calcified plaque. Vertebral arteries: Calcified plaque at the origin of the right vertebral artery with suspected at least moderate ostial stenosis. Calcified plaque at the origin of the left vertebral artery with moderate ostial stenosis. More distally, the bilateral vertebral arteries are otherwise patent within the neck without significant stenosis (50% or greater). Skeleton: Cervical spondylosis with multilevel moderate/severe disc height loss, small posterior disc osteophytes, uncovertebral and facet hypertrophy. C4-C5 grade 1 anterolisthesis. No acute bony abnormality. Other neck: No soft tissue neck mass or pathologically enlarged cervical chain lymph nodes. Thyroid unremarkable. Upper chest: Moderate centrilobular emphysema within the imaged lung apices. Partially visualized pacer leads. Review of the MIP images confirms the above findings CTA HEAD FINDINGS Anterior circulation: Calcified atherosclerotic disease within the bilateral intracranial internal carotid arteries. Regions of mild stenosis within the intracranial right internal carotid artery. Mild-to-moderate stenosis within the cavernous left ICA. The right  middle cerebral artery is patent without significant stenosis. Hypoplastic the A1 right anterior cerebral artery. The A1 left anterior cerebral artery predominantly supplies the bilateral A2 anterior cerebral arteries. Mild-to-moderate focal stenosis within the proximal A2 right anterior cerebral artery. The left anterior cerebral artery is patent without significant proximal stenosis. The left middle cerebral artery is patent without significant proximal stenosis. Posterior circulation: The intracranial vertebral arteries are patent without significant stenosis. Flow is seen within the right PICA. The left PICA is poorly delineated. The basilar artery is patent. Mild focal stenosis within the mid basilar artery. Flow is seen within the proximal left AICA. The right AICA is poorly delineated. Flow is seen within the proximal superior cerebellar arteries bilaterally. Predominantly fetal origin of the right posterior cerebral artery. The right posterior cerebral artery is patent without significant proximal stenosis. The left posterior cerebral artery is patent without significant proximal stenosis. No intracranial aneurysm is identified Venous sinuses: Within limitations of contrast timing, no convincing thrombus. Anatomic variants: As described. Review of the MIP images confirms the above findings IMPRESSION: CT head: 1. No evidence of acute intracranial hemorrhage or acute demarcated cortical infarction. 2. Generalized parenchymal atrophy with extensive chronic small vessel ischemic disease. CTA head: 1. The left PICA and right AICA are poorly delineated. This may be due to small vessel size. Stenoses within these vessels cannot be excluded. 2. Intracranial atherosclerotic disease with multifocal stenoses, most notably as follows. 3. Mild-to-moderate focal stenosis within the cavernous left internal carotid artery. 4. Mild-to-moderate focal stenosis within the proximal A2 right anterior cerebral artery. 5. Mild  focal stenosis within the mid basilar artery. CTA neck: 1. Extensive calcified plaque results in either segmental occlusion or segmental high-grade stenosis of the proximal left subclavian artery. Reconstitution of the left subclavian artery proximal to left vertebral artery takeoff. Consider carotid artery/vertebral artery duplex for further evaluation to assess for left subclavian steal phenomenon. 2. Bilateral common and internal carotid arteries patent within the neck. Calcified plaque within the proximal right ICA results in 40% stenosis. 3. Plaque at the origins of the bilateral vertebral arteries with at least moderate right ostial stenosis and moderate left ostial stenosis. Distal to this, the bilateral vertebral arteries are patent within the neck without significant stenosis (50% or greater). Electronically Signed   By: Kellie Simmering   On: 03/06/2019 15:40   Ct Head Wo Contrast  Result Date: 03/06/2019 CLINICAL DATA:  Dizziness and nausea. Vertiginous syndrome. EXAM: CT HEAD WITHOUT CONTRAST TECHNIQUE: Contiguous axial images were  obtained from the base of the skull through the vertex without intravenous contrast. COMPARISON:  None. FINDINGS: Brain: No evidence of acute infarction, hemorrhage, hydrocephalus, extra-axial collection or mass lesion/mass effect. Diffuse mild cerebral cortical atrophy. No ventricular dilatation. Extensive periventricular white matter lucency consistent with chronic small vessel ischemic change. Vascular: No hyperdense vessel or unexpected calcification. Skull: Normal. Negative for fracture or focal lesion. Sinuses/Orbits: No acute finding. Evidence of bilateral eye surgery. Other: None. IMPRESSION: No acute abnormality. Extensive chronic small vessel ischemic changes in the periventricular white matter. Diffuse mild atrophy. Electronically Signed   By: Lorriane Shire M.D.   On: 03/06/2019 11:17   Ct Code Stroke Cta Neck W/wo Contrast  Result Date: 03/06/2019 CLINICAL  DATA:  Vertigo, persistent, central. Additional history provided: Dizziness upon waking this morning, nausea. Per provider, patient not a stroke code. EXAM: CT ANGIOGRAPHY HEAD AND NECK TECHNIQUE: Multidetector CT imaging of the head and neck was performed using the standard protocol during bolus administration of intravenous contrast. Multiplanar CT image reconstructions and MIPs were obtained to evaluate the vascular anatomy. Carotid stenosis measurements (when applicable) are obtained utilizing NASCET criteria, using the distal internal carotid diameter as the denominator. CONTRAST:  86m OMNIPAQUE IOHEXOL 350 MG/ML SOLN COMPARISON:  Noncontrast head CT performed earlier the same day 03/06/2019., MRA neck 04/29/2006. FINDINGS: CT HEAD FINDINGS Brain: There is no evidence of acute intracranial hemorrhage. Calcification within the bilateral basal ganglia. Again demonstrated is extensive patchy and confluent hypodensity within the cerebral white matter and to a lesser degree within the cerebellar white matter, nonspecific but consistent with chronic small vessel ischemic disease. No demarcated cortical infarction is identified. No evidence of intracranial mass. No midline shift or extra-axial fluid collection. Mild generalized parenchymal atrophy. Vascular: No hyperdense vessel. Atherosclerotic calcification of the carotid artery siphons and vertebrobasilar system. Skull: No calvarial fracture. Sinuses: Minimal scattered paranasal sinus mucosal thickening. No significant mastoid effusion Orbits: Visualized orbits demonstrate no acute abnormality. Review of the MIP images confirms the above findings These results were called by telephone at the time of interpretation on 03/06/2019 at 3:04 pm to provider PA [facility] who verbally acknowledged these results. CTA NECK FINDINGS Aortic arch: Included portions of the aortic arch demonstrate no evidence of dissection or aneurysm. Prominent soft and calcified plaque within the  visualized aortic arch and proximal major branch vessels of the neck. Prominent calcified plaque within the proximal left subclavian artery with either severe stenosis or segmental occlusion of the proximal left subclavian artery. Reconstitution proximal to left vertebral artery takeoff. Right carotid system: Common carotid artery patent without significant stenosis. Prominent calcified plaque at the carotid bifurcation and extending into the proximal ICA. Resultant approximate 40% stenosis of the proximal ICA. Distal to this, the ICA is patent within the neck without significant stenosis. Left carotid system: CCA and ICA patent within the neck without significant stenosis (50% or greater). Scattered soft and calcified plaque. Vertebral arteries: Calcified plaque at the origin of the right vertebral artery with suspected at least moderate ostial stenosis. Calcified plaque at the origin of the left vertebral artery with moderate ostial stenosis. More distally, the bilateral vertebral arteries are otherwise patent within the neck without significant stenosis (50% or greater). Skeleton: Cervical spondylosis with multilevel moderate/severe disc height loss, small posterior disc osteophytes, uncovertebral and facet hypertrophy. C4-C5 grade 1 anterolisthesis. No acute bony abnormality. Other neck: No soft tissue neck mass or pathologically enlarged cervical chain lymph nodes. Thyroid unremarkable. Upper chest: Moderate centrilobular emphysema within the imaged  lung apices. Partially visualized pacer leads. Review of the MIP images confirms the above findings CTA HEAD FINDINGS Anterior circulation: Calcified atherosclerotic disease within the bilateral intracranial internal carotid arteries. Regions of mild stenosis within the intracranial right internal carotid artery. Mild-to-moderate stenosis within the cavernous left ICA. The right middle cerebral artery is patent without significant stenosis. Hypoplastic the A1 right  anterior cerebral artery. The A1 left anterior cerebral artery predominantly supplies the bilateral A2 anterior cerebral arteries. Mild-to-moderate focal stenosis within the proximal A2 right anterior cerebral artery. The left anterior cerebral artery is patent without significant proximal stenosis. The left middle cerebral artery is patent without significant proximal stenosis. Posterior circulation: The intracranial vertebral arteries are patent without significant stenosis. Flow is seen within the right PICA. The left PICA is poorly delineated. The basilar artery is patent. Mild focal stenosis within the mid basilar artery. Flow is seen within the proximal left AICA. The right AICA is poorly delineated. Flow is seen within the proximal superior cerebellar arteries bilaterally. Predominantly fetal origin of the right posterior cerebral artery. The right posterior cerebral artery is patent without significant proximal stenosis. The left posterior cerebral artery is patent without significant proximal stenosis. No intracranial aneurysm is identified Venous sinuses: Within limitations of contrast timing, no convincing thrombus. Anatomic variants: As described. Review of the MIP images confirms the above findings IMPRESSION: CT head: 1. No evidence of acute intracranial hemorrhage or acute demarcated cortical infarction. 2. Generalized parenchymal atrophy with extensive chronic small vessel ischemic disease. CTA head: 1. The left PICA and right AICA are poorly delineated. This may be due to small vessel size. Stenoses within these vessels cannot be excluded. 2. Intracranial atherosclerotic disease with multifocal stenoses, most notably as follows. 3. Mild-to-moderate focal stenosis within the cavernous left internal carotid artery. 4. Mild-to-moderate focal stenosis within the proximal A2 right anterior cerebral artery. 5. Mild focal stenosis within the mid basilar artery. CTA neck: 1. Extensive calcified plaque  results in either segmental occlusion or segmental high-grade stenosis of the proximal left subclavian artery. Reconstitution of the left subclavian artery proximal to left vertebral artery takeoff. Consider carotid artery/vertebral artery duplex for further evaluation to assess for left subclavian steal phenomenon. 2. Bilateral common and internal carotid arteries patent within the neck. Calcified plaque within the proximal right ICA results in 40% stenosis. 3. Plaque at the origins of the bilateral vertebral arteries with at least moderate right ostial stenosis and moderate left ostial stenosis. Distal to this, the bilateral vertebral arteries are patent within the neck without significant stenosis (50% or greater). Electronically Signed   By: Kellie Simmering   On: 03/06/2019 15:40   Ct Code Stroke Cerebral Perfusion With Contrast  Result Date: 03/06/2019 CLINICAL DATA:  Vertigo EXAM: CT PERFUSION BRAIN TECHNIQUE: Multiphase CT imaging of the brain was performed following IV bolus contrast injection. Subsequent parametric perfusion maps were calculated using RAPID software. CONTRAST:  29m OMNIPAQUE IOHEXOL 350 MG/ML SOLN COMPARISON:  None. FINDINGS: CT Brain Perfusion Findings: CBF (<30%) Volume: 057mPerfusion (Tmax>6.0s) volume: 16m103mismatch Volume: 16mL75mPECTS on noncontrast CT Head: 10 at 10:43 a.m. today. Infarct Core: 0 mL Infarction Location:None IMPRESSION: No core infarct or penumbra. Electronically Signed   By: KeviUlyses Jarred.   On: 03/06/2019 23:14     Assessment:  Mr. JameJESSIAH STEINHARTa 89 y34. male with history of complete heart block status post pacemaker, HTN, HLD, DB, left CEA, chronic left subclavian artery occlusion with VA retrograde flow presenting with  dizziness and pale, resolved after IV bolus. He did not receive IV t-PA.   Likely presyncope due to dehydration or hypotension.  BPPV still in DDX.  CT head No acute stroke. Extensive small vessel disease. Atrophy.     CTA head L  PICA and R AICA poorly seen. Mid to moderate L ICA cavernous and bulb stenosis, proximal R A2 stenoses. Mild mid BA stenosis.    CTA neck extensive calcified plaque with either occlusion or high-grade stenosis proximal L subclavian artery. ? Subclavian steal. R ICA 40% stenosis. Plaque at origin B VAs w/ moderate right ostial stenosis and moderate L ostial stenosis.   Carotid Doppler 09/2017 left subclavian artery monophasic, left VA retrograde flow  2D Echo EF 60-65%. No source of embolus   Pacemaker interrogation no A. fib  LDL 91  HgbA1c 6.4   Lovenox 40 mg sq daily for VTE prophylaxis  aspirin 81 mg daily prior to admission, now on aspirin 325 mg daily.  Continue aspirin 325 on discharge.  Therapy recommendations:  No therapy needs, recommend outpatient vestibular PT referral.  Disposition:  Return home  Chronic left subclavian artery occlusion  Chronic left VA retrograde flow  Carotid Doppler 09/2017 left subclavian artery monophasic, left VA retrograde flow  Vascular surgery Dr. Donnetta Hutching on board  No further recurrence at this time  VVS follow up in 1 yr  Dehydration vs hypotension  As per daughter, patient not drinking as supposed to  BP soft during admission  Hyzaar is discontinued  Recommend cutting half of metoprolol for better BP range  BP goal 120-150 given subclavian artery occlusion with VA retrograde flow  Close BP monitoring at home  Follow-up with PCP closely  Hyperlipidemia  Home meds:  crestor 20, resumed in hospital  LDL 91, goal < 70  Continue statin at discharge  Diabetes type II, Controlled  HgbA1c 6.4, goal < 7.0  CBGs  SSI  Other Stroke Risk Factors  Advanced age  Former Cigarette smoker, quit in 2001  History of left CEA  CHB s/p Pacemaker - interrogated, noted arrhythmia but no AF  Other Active Problems  CKD stage 3  Mild thrombocytopenia 140   Hospital day # 0   Thank you for this consultation and allowing [place]  to participate in the care of this patient. Neurology will sign off. Please call with questions. Thanks for the consult. No neuro follow up needed at this time.  Rosalin Hawking, MD PhD Stroke Neurology 03/07/2019 5:35 PM   To contact Stroke Continuity provider, please refer to [email or web address] After hours, contact General Neurology

## 2019-03-07 NOTE — Progress Notes (Addendum)
?   TIA-- suspect needs higher BP 130-50, also may need ASA/plavix combo-- will ask Neurology to see and make recommendations.   PT unable to reproduce.  Symptoms resolved.  ? Positional which would be less likely TIA No a fib seen on pacemaker interrogation Hope for d/c later today JV

## 2019-03-07 NOTE — Discharge Summary (Signed)
Physician Discharge Summary  [name] [id number] DOB: [birth year] DOA: 03/06/2019  PCP: Biagio Borg, MD  Admit date: 03/06/2019 Discharge date: 03/07/2019  Admitted From: home Discharge disposition: home   Recommendations for Outpatient Follow-Up:   1. Monitor BP closely-- needs higher BP 130-150    Discharge Diagnosis:   Principal Problem:   Dizziness Active Problems:   Diabetes ([place])   Hyperlipidemia   Essential hypertension   COPD GOLD II/ emphysematous features   Pacemaker    Discharge Condition: Improved.  Diet recommendation: Low sodium, heart healthy.  Carbohydrate-modified  Wound care: None.  Code status: Full.   History of Present Illness:   Daniel Woodard is a 85 y.o. male with history of complete heart block status post pacemaker placement, hypertension, chronic kidney disease, diabetes mellitus type 2 was brought to the ER after patient has been having persistent dizziness.  Patient symptoms started this morning around 2 AM.  Patient felt dizzy on moving.  Denies any weakness of upper or lower extremities.  Eyes any visual symptoms or any difficulty speaking or swallowing.  Since it is very persistent patient came to the ER.  ED Course: In the ER EKG shows paced rhythm.  Labs reveal creatinine 1.5 potassium 4.5 hemoglobin 14.5 platelet 136.  CT head followed by CT angiogram of the head and neck were done.  Tele neurology was consulted.  Requested to get also CT perfusion study of the brain.  CT angiogram of the head and neck showed occlusion of the proximal left subclavian and also plaques in the vertebral artery.  On-call vascular surgeon Dr. Donnetta Hutching was notified.  Dr. Donnetta Hutching will be seeing patient in consult.  Patient admitted for further management of dizziness.   Hospital Course by Problem:   Likely presyncope due to dehydration or hypotension vs cerebral hypoperfusion.  BPPV still in DDX but symptoms were not able to be re-produced with  [name].    CT head No acute stroke. Extensive small vessel disease. Atrophy.     CTA head L PICA and R AICA poorly seen. Mid to moderate L ICA cavernous and bulb stenosis, proximal R A2 stenoses. Mild mid BA stenosis.    CTA neck extensive calcified plaque with either occlusion or high-grade stenosis proximal L subclavian artery. ? Subclavian steal. R ICA 40% stenosis. Plaque at origin B VAs w/ moderate right ostial stenosis and moderate L ostial stenosis.   2D Echo EF 60-65%. No source of embolus   Pacemaker interrogation: no A. fib  LDL 91  HgbA1c 6.4   aspirin 81 mg daily prior to admission, now on aspirin 325 mg daily.  per neurology: Continue aspirin 325 on discharge.   Chronic left subclavian artery occlusion  Chronic left VA retrograde flow  Carotid Doppler 09/2017 left subclavian artery monophasic, left VA retrograde flow  Vascular surgery Dr. Donnetta Hutching on board  No further recurrence at this time  VVS follow up in 1 yr  Dehydration vs hypotension  As per daughter, patient not drinking as supposed to  BP soft during admission-- BP varies greatly depending on which arm is checked  Hyzaar is discontinued  Recommend cutting half of metoprolol for better BP range  BP goal 120-150 given subclavian artery occlusion with VA retrograde flow  Close BP monitoring at home  Follow-up with PCP closely    Hyperlipidemia  Home meds:  crestor 20, resumed in hospital  LDL 91, goal < 70  Continue statin at discharge  Diabetes type II, Controlled  HgbA1c 6.4, goal < 7.0     Medical Consultants:   neurology   Discharge Exam:   Vitals:   03/07/19 1622 03/07/19 1653  BP: 110/73 (!) 161/59  Pulse: 72   Resp: 20   Temp:    SpO2: 96%    Vitals:   03/07/19 0745 03/07/19 1134 03/07/19 1622 03/07/19 1653  BP: 119/75 105/71 110/73 (!) 161/59  Pulse: 62 70 72   Resp: 20 16 20    Temp: (!) 97.5 F (36.4 C) 97.7 F (36.5 C)    TempSrc: Oral  Oral    SpO2: 97% 96% 96%     General exam: Appears calm and comfortable.    The results of significant diagnostics from this hospitalization (including imaging, microbiology, ancillary and laboratory) are listed below for reference.     Procedures and Diagnostic Studies:   Ct Code Stroke Cta Head W/wo Contrast  Result Date: 03/06/2019 CLINICAL DATA:  Vertigo, persistent, central. Additional history provided: Dizziness upon waking this morning, nausea. Per provider, patient not a stroke code. EXAM: CT ANGIOGRAPHY HEAD AND NECK TECHNIQUE: Multidetector CT imaging of the head and neck was performed using the standard protocol during bolus administration of intravenous contrast. Multiplanar CT image reconstructions and MIPs were obtained to evaluate the vascular anatomy. Carotid stenosis measurements (when applicable) are obtained utilizing NASCET criteria, using the distal internal carotid diameter as the denominator. CONTRAST:  75mL OMNIPAQUE IOHEXOL 350 MG/ML SOLN COMPARISON:  Noncontrast head CT performed earlier the same day 03/06/2019., MRA neck 04/29/2006. FINDINGS: CT HEAD FINDINGS Brain: There is no evidence of acute intracranial hemorrhage. Calcification within the bilateral basal ganglia. Again demonstrated is extensive patchy and confluent hypodensity within the cerebral white matter and to a lesser degree within the cerebellar white matter, nonspecific but consistent with chronic small vessel ischemic disease. No demarcated cortical infarction is identified. No evidence of intracranial mass. No midline shift or extra-axial fluid collection. Mild generalized parenchymal atrophy. Vascular: No hyperdense vessel. Atherosclerotic calcification of the carotid artery siphons and vertebrobasilar system. Skull: No calvarial fracture. Sinuses: Minimal scattered paranasal sinus mucosal thickening. No significant mastoid effusion Orbits: Visualized orbits demonstrate no acute abnormality. Review of the  MIP images confirms the above findings These results were called by telephone at the time of interpretation on 03/06/2019 at 3:04 pm to provider PA [facility], who verbally acknowledged these results. CTA NECK FINDINGS Aortic arch: Included portions of the aortic arch demonstrate no evidence of dissection or aneurysm. Prominent soft and calcified plaque within the visualized aortic arch and proximal major branch vessels of the neck. Prominent calcified plaque within the proximal left subclavian artery with either severe stenosis or segmental occlusion of the proximal left subclavian artery. Reconstitution proximal to left vertebral artery takeoff. Right carotid system: Common carotid artery patent without significant stenosis. Prominent calcified plaque at the carotid bifurcation and extending into the proximal ICA. Resultant approximate 40% stenosis of the proximal ICA. Distal to this, the ICA is patent within the neck without significant stenosis. Left carotid system: CCA and ICA patent within the neck without significant stenosis (50% or greater). Scattered soft and calcified plaque. Vertebral arteries: Calcified plaque at the origin of the right vertebral artery with suspected at least moderate ostial stenosis. Calcified plaque at the origin of the left vertebral artery with moderate ostial stenosis. More distally, the bilateral vertebral arteries are otherwise patent within the neck without significant stenosis (50% or greater). Skeleton: Cervical spondylosis with multilevel moderate/severe disc height loss,  small posterior disc osteophytes, uncovertebral and facet hypertrophy. C4-C5 grade 1 anterolisthesis. No acute bony abnormality. Other neck: No soft tissue neck mass or pathologically enlarged cervical chain lymph nodes. Thyroid unremarkable. Upper chest: Moderate centrilobular emphysema within the imaged lung apices. Partially visualized pacer leads. Review of the MIP images confirms the above findings CTA  HEAD FINDINGS Anterior circulation: Calcified atherosclerotic disease within the bilateral intracranial internal carotid arteries. Regions of mild stenosis within the intracranial right internal carotid artery. Mild-to-moderate stenosis within the cavernous left ICA. The right middle cerebral artery is patent without significant stenosis. Hypoplastic the A1 right anterior cerebral artery. The A1 left anterior cerebral artery predominantly supplies the bilateral A2 anterior cerebral arteries. Mild-to-moderate focal stenosis within the proximal A2 right anterior cerebral artery. The left anterior cerebral artery is patent without significant proximal stenosis. The left middle cerebral artery is patent without significant proximal stenosis. Posterior circulation: The intracranial vertebral arteries are patent without significant stenosis. Flow is seen within the right PICA. The left PICA is poorly delineated. The basilar artery is patent. Mild focal stenosis within the mid basilar artery. Flow is seen within the proximal left AICA. The right AICA is poorly delineated. Flow is seen within the proximal superior cerebellar arteries bilaterally. Predominantly fetal origin of the right posterior cerebral artery. The right posterior cerebral artery is patent without significant proximal stenosis. The left posterior cerebral artery is patent without significant proximal stenosis. No intracranial aneurysm is identified Venous sinuses: Within limitations of contrast timing, no convincing thrombus. Anatomic variants: As described. Review of the MIP images confirms the above findings IMPRESSION: CT head: 1. No evidence of acute intracranial hemorrhage or acute demarcated cortical infarction. 2. Generalized parenchymal atrophy with extensive chronic small vessel ischemic disease. CTA head: 1. The left PICA and right AICA are poorly delineated. This may be due to small vessel size. Stenoses within these vessels cannot be excluded.  2. Intracranial atherosclerotic disease with multifocal stenoses, most notably as follows. 3. Mild-to-moderate focal stenosis within the cavernous left internal carotid artery. 4. Mild-to-moderate focal stenosis within the proximal A2 right anterior cerebral artery. 5. Mild focal stenosis within the mid basilar artery. CTA neck: 1. Extensive calcified plaque results in either segmental occlusion or segmental high-grade stenosis of the proximal left subclavian artery. Reconstitution of the left subclavian artery proximal to left vertebral artery takeoff. Consider carotid artery/vertebral artery duplex for further evaluation to assess for left subclavian steal phenomenon. 2. Bilateral common and internal carotid arteries patent within the neck. Calcified plaque within the proximal right ICA results in 40% stenosis. 3. Plaque at the origins of the bilateral vertebral arteries with at least moderate right ostial stenosis and moderate left ostial stenosis. Distal to this, the bilateral vertebral arteries are patent within the neck without significant stenosis (50% or greater). Electronically Signed   By: Kellie Simmering   On: 03/06/2019 15:40   Ct Head Wo Contrast  Result Date: 03/06/2019 CLINICAL DATA:  Dizziness and nausea. Vertiginous syndrome. EXAM: CT HEAD WITHOUT CONTRAST TECHNIQUE: Contiguous axial images were obtained from the base of the skull through the vertex without intravenous contrast. COMPARISON:  None. FINDINGS: Brain: No evidence of acute infarction, hemorrhage, hydrocephalus, extra-axial collection or mass lesion/mass effect. Diffuse mild cerebral cortical atrophy. No ventricular dilatation. Extensive periventricular white matter lucency consistent with chronic small vessel ischemic change. Vascular: No hyperdense vessel or unexpected calcification. Skull: Normal. Negative for fracture or focal lesion. Sinuses/Orbits: No acute finding. Evidence of bilateral eye surgery. Other: None. IMPRESSION: No  acute abnormality. Extensive chronic small vessel ischemic changes in the periventricular white matter. Diffuse mild atrophy. Electronically Signed   By: Lorriane Shire M.D.   On: 03/06/2019 11:17   Ct Code Stroke Cta Neck W/wo Contrast  Result Date: 03/06/2019 CLINICAL DATA:  Vertigo, persistent, central. Additional history provided: Dizziness upon waking this morning, nausea. Per provider, patient not a stroke code. EXAM: CT ANGIOGRAPHY HEAD AND NECK TECHNIQUE: Multidetector CT imaging of the head and neck was performed using the standard protocol during bolus administration of intravenous contrast. Multiplanar CT image reconstructions and MIPs were obtained to evaluate the vascular anatomy. Carotid stenosis measurements (when applicable) are obtained utilizing NASCET criteria, using the distal internal carotid diameter as the denominator. CONTRAST:  60mL OMNIPAQUE IOHEXOL 350 MG/ML SOLN COMPARISON:  Noncontrast head CT performed earlier the same day 03/06/2019., MRA neck 04/29/2006. FINDINGS: CT HEAD FINDINGS Brain: There is no evidence of acute intracranial hemorrhage. Calcification within the bilateral basal ganglia. Again demonstrated is extensive patchy and confluent hypodensity within the cerebral white matter and to a lesser degree within the cerebellar white matter, nonspecific but consistent with chronic small vessel ischemic disease. No demarcated cortical infarction is identified. No evidence of intracranial mass. No midline shift or extra-axial fluid collection. Mild generalized parenchymal atrophy. Vascular: No hyperdense vessel. Atherosclerotic calcification of the carotid artery siphons and vertebrobasilar system. Skull: No calvarial fracture. Sinuses: Minimal scattered paranasal sinus mucosal thickening. No significant mastoid effusion Orbits: Visualized orbits demonstrate no acute abnormality. Review of the MIP images confirms the above findings These results were called by telephone at the  time of interpretation on 03/06/2019 at 3:04 pm to provider PA [facility], who verbally acknowledged these results. CTA NECK FINDINGS Aortic arch: Included portions of the aortic arch demonstrate no evidence of dissection or aneurysm. Prominent soft and calcified plaque within the visualized aortic arch and proximal major branch vessels of the neck. Prominent calcified plaque within the proximal left subclavian artery with either severe stenosis or segmental occlusion of the proximal left subclavian artery. Reconstitution proximal to left vertebral artery takeoff. Right carotid system: Common carotid artery patent without significant stenosis. Prominent calcified plaque at the carotid bifurcation and extending into the proximal ICA. Resultant approximate 40% stenosis of the proximal ICA. Distal to this, the ICA is patent within the neck without significant stenosis. Left carotid system: CCA and ICA patent within the neck without significant stenosis (50% or greater). Scattered soft and calcified plaque. Vertebral arteries: Calcified plaque at the origin of the right vertebral artery with suspected at least moderate ostial stenosis. Calcified plaque at the origin of the left vertebral artery with moderate ostial stenosis. More distally, the bilateral vertebral arteries are otherwise patent within the neck without significant stenosis (50% or greater). Skeleton: Cervical spondylosis with multilevel moderate/severe disc height loss, small posterior disc osteophytes, uncovertebral and facet hypertrophy. C4-C5 grade 1 anterolisthesis. No acute bony abnormality. Other neck: No soft tissue neck mass or pathologically enlarged cervical chain lymph nodes. Thyroid unremarkable. Upper chest: Moderate centrilobular emphysema within the imaged lung apices. Partially visualized pacer leads. Review of the MIP images confirms the above findings CTA HEAD FINDINGS Anterior circulation: Calcified atherosclerotic disease within the  bilateral intracranial internal carotid arteries. Regions of mild stenosis within the intracranial right internal carotid artery. Mild-to-moderate stenosis within the cavernous left ICA. The right middle cerebral artery is patent without significant stenosis. Hypoplastic the A1 right anterior cerebral artery. The A1 left anterior cerebral artery predominantly supplies the bilateral A2 anterior cerebral arteries.  Mild-to-moderate focal stenosis within the proximal A2 right anterior cerebral artery. The left anterior cerebral artery is patent without significant proximal stenosis. The left middle cerebral artery is patent without significant proximal stenosis. Posterior circulation: The intracranial vertebral arteries are patent without significant stenosis. Flow is seen within the right PICA. The left PICA is poorly delineated. The basilar artery is patent. Mild focal stenosis within the mid basilar artery. Flow is seen within the proximal left AICA. The right AICA is poorly delineated. Flow is seen within the proximal superior cerebellar arteries bilaterally. Predominantly fetal origin of the right posterior cerebral artery. The right posterior cerebral artery is patent without significant proximal stenosis. The left posterior cerebral artery is patent without significant proximal stenosis. No intracranial aneurysm is identified Venous sinuses: Within limitations of contrast timing, no convincing thrombus. Anatomic variants: As described. Review of the MIP images confirms the above findings IMPRESSION: CT head: 1. No evidence of acute intracranial hemorrhage or acute demarcated cortical infarction. 2. Generalized parenchymal atrophy with extensive chronic small vessel ischemic disease. CTA head: 1. The left PICA and right AICA are poorly delineated. This may be due to small vessel size. Stenoses within these vessels cannot be excluded. 2. Intracranial atherosclerotic disease with multifocal stenoses, most notably as  follows. 3. Mild-to-moderate focal stenosis within the cavernous left internal carotid artery. 4. Mild-to-moderate focal stenosis within the proximal A2 right anterior cerebral artery. 5. Mild focal stenosis within the mid basilar artery. CTA neck: 1. Extensive calcified plaque results in either segmental occlusion or segmental high-grade stenosis of the proximal left subclavian artery. Reconstitution of the left subclavian artery proximal to left vertebral artery takeoff. Consider carotid artery/vertebral artery duplex for further evaluation to assess for left subclavian steal phenomenon. 2. Bilateral common and internal carotid arteries patent within the neck. Calcified plaque within the proximal right ICA results in 40% stenosis. 3. Plaque at the origins of the bilateral vertebral arteries with at least moderate right ostial stenosis and moderate left ostial stenosis. Distal to this, the bilateral vertebral arteries are patent within the neck without significant stenosis (50% or greater). Electronically Signed   By: Kellie Simmering   On: 03/06/2019 15:40   Ct Code Stroke Cerebral Perfusion With Contrast  Result Date: 03/06/2019 CLINICAL DATA:  Vertigo EXAM: CT PERFUSION BRAIN TECHNIQUE: Multiphase CT imaging of the brain was performed following IV bolus contrast injection. Subsequent parametric perfusion maps were calculated using RAPID software. CONTRAST:  65mL OMNIPAQUE IOHEXOL 350 MG/ML SOLN COMPARISON:  None. FINDINGS: CT Brain Perfusion Findings: CBF (<30%) Volume: 73mL Perfusion (Tmax>6.0s) volume: 22mL Mismatch Volume: 80mL ASPECTS on noncontrast CT Head: 10 at 10:43 a.m. today. Infarct Core: 0 mL Infarction Location:None IMPRESSION: No core infarct or penumbra. Electronically Signed   By: Ulyses Jarred M.D.   On: 03/06/2019 23:14     Labs:   Basic Metabolic Panel: Recent Labs  Lab 03/06/19 1108 03/06/19 2045 03/07/19 0322  NA 136  --  138  K 4.5  --  4.4  CL 102  --  103  CO2 23  --  24    GLUCOSE 166*  --  110*  BUN 38*  --  32*  CREATININE 1.52* 1.65* 1.55*  CALCIUM 9.6  --  8.9   GFR CrCl cannot be calculated (Unknown ideal weight.). Liver Function Tests: Recent Labs  Lab 03/07/19 0322  AST 16  ALT 16  ALKPHOS 59  BILITOT 1.2  PROT 6.8  ALBUMIN 3.5   No results for input(s): LIPASE,  AMYLASE in the last 168 hours. No results for input(s): AMMONIA in the last 168 hours. Coagulation profile Recent Labs  Lab 03/06/19 1335  INR 1.1    CBC: Recent Labs  Lab 03/06/19 1108 03/06/19 2045 03/07/19 0322  WBC 6.9 7.6 7.7  NEUTROABS 5.1  --   --   HGB 15.4 14.5 14.4  HCT 46.8 42.0 42.2  MCV 91.1 90.1 90.2  PLT 153 136* 140*   Cardiac Enzymes: No results for input(s): CKTOTAL, CKMB, CKMBINDEX, TROPONINI in the last 168 hours. BNP: Invalid input(s): POCBNP CBG: Recent Labs  Lab 03/06/19 1058 03/06/19 2127 03/07/19 0615 03/07/19 1619  GLUCAP 150* 97 108* 129*   D-Dimer No results for input(s): DDIMER in the last 72 hours. Hgb A1c Recent Labs    03/07/19 0322  HGBA1C 6.4*   Lipid Profile Recent Labs    03/07/19 0322  CHOL 146  HDL 25*  LDLCALC 91  TRIG 148  CHOLHDL 5.8   Thyroid function studies No results for input(s): TSH, T4TOTAL, T3FREE, THYROIDAB in the last 72 hours.  Invalid input(s): FREET3 Anemia work up No results for input(s): VITAMINB12, FOLATE, FERRITIN, TIBC, IRON, RETICCTPCT in the last 72 hours. Microbiology Recent Results (from the past 240 hour(s))  SARS Coronavirus 2 [facility] order, Performed in [facility] hospital lab) Nasopharyngeal Nasopharyngeal Swab     Status: None   Collection Time: 03/06/19 12:54 PM   Specimen: Nasopharyngeal Swab  Result Value Ref Range Status   SARS Coronavirus 2 NEGATIVE NEGATIVE Final    Comment: (NOTE) If result is NEGATIVE SARS-CoV-2 target nucleic acids are NOT DETECTED. The SARS-CoV-2 RNA is generally detectable in upper and lower  respiratory specimens during the acute  phase of infection. The lowest  concentration of SARS-CoV-2 viral copies this assay can detect is 250  copies / mL. A negative result does not preclude SARS-CoV-2 infection  and should not be used as the sole basis for treatment or other  patient management decisions.  A negative result may occur with  improper specimen collection / handling, submission of specimen other  than nasopharyngeal swab, presence of viral mutation(s) within the  areas targeted by this assay, and inadequate number of viral copies  (<250 copies / mL). A negative result must be combined with clinical  observations, patient history, and epidemiological information. If result is POSITIVE SARS-CoV-2 target nucleic acids are DETECTED. The SARS-CoV-2 RNA is generally detectable in upper and lower  respiratory specimens dur ing the acute phase of infection.  Positive  results are indicative of active infection with SARS-CoV-2.  Clinical  correlation with patient history and other diagnostic information is  necessary to determine patient infection status.  Positive results do  not rule out bacterial infection or co-infection with other viruses. If result is PRESUMPTIVE POSTIVE SARS-CoV-2 nucleic acids MAY BE PRESENT.   A presumptive positive result was obtained on the submitted specimen  and confirmed on repeat testing.  While 2019 novel coronavirus  (SARS-CoV-2) nucleic acids may be present in the submitted sample  additional confirmatory testing may be necessary for epidemiological  and / or clinical management purposes  to differentiate between  SARS-CoV-2 and other Sarbecovirus currently known to infect humans.  If clinically indicated additional testing with an alternate test  methodology [phone]) is advised. The SARS-CoV-2 RNA is generally  detectable in upper and lower respiratory sp ecimens during the acute  phase of infection. The expected result is Negative. Fact Sheet for Patients:   StrictlyIdeas.no Fact Sheet  for Healthcare Providers: BankingDealers.co.za This test is not yet approved or cleared by the [place] and has been authorized for detection and/or diagnosis of SARS-CoV-2 by FDA under an Emergency Use Authorization (EUA).  This EUA will remain in effect (meaning this test can be used) for the duration of the COVID-19 declaration under Section 564(b)(1) of the Act, 21 U.S.C. section 360bbb-3(b)(1), unless the authorization is terminated or revoked sooner. Performed at [facility], [address] [place], [place] [address]      Discharge Instructions:   Discharge Instructions    Diet - low sodium heart healthy   Complete by: As directed    Discharge instructions   Complete by: As directed    Goal for your blood pressure is top number 130-150 I have also increased your ASA to 325mg  daily   Increase activity slowly   Complete by: As directed      Allergies as of 03/07/2019      Reactions   Ace Inhibitors Anaphylaxis   REACTION: cough REACTION: cough REACTION: cough REACTION: cough      Medication List    STOP taking these medications   Aspirin Adult Low Strength 81 MG EC tablet Generic drug: aspirin Replaced by: aspirin 325 MG tablet   losartan-hydrochlorothiazide 100-25 MG tablet Commonly known as: HYZAAR     TAKE these medications   aspirin 325 MG tablet Take 1 tablet (325 mg total) by mouth daily. Replaces: Aspirin Adult Low Strength 81 MG EC tablet   CoQ-10 200 MG Caps Take 200 mg by mouth daily.   Fluad 0.5 ML Susy Generic drug: Influenza Vac A&B Surf Ant Adj PHARMACIST ADMINISTERED IMMUNIZATION ADMINISTERED AT TIME OF DISPENSING   metoprolol succinate 25 MG 24 hr tablet Commonly known as: TOPROL-XL Take 0.5 tablets (12.5 mg total) by mouth daily. What changed: how much to take   omeprazole 20 MG capsule Commonly known as: PRILOSEC Take 1 capsule by  mouth once daily   psyllium 58.6 % powder Commonly known as: METAMUCIL Take 2 caps by mouth every day   rosuvastatin 20 MG tablet Commonly known as: CRESTOR TAKE 1 TABLET BY MOUTH AT BEDTIME         Time coordinating discharge: 35 min  Signed:  Geradine Girt DO  [facility] 03/08/2019, 1:14 PM

## 2019-03-07 NOTE — Progress Notes (Signed)
SLP Cancellation Note  Patient Details Name: Daniel Woodard MRN: [id number] DOB: 1929-11-16   Cancelled treatment:        Due to pt being in procedure (echocardiogram).  Will see at next available time.    Charlynne Cousins Miron Marxen, MA, CCC-SLP 03/07/2019 11:28 AM

## 2019-03-07 NOTE — Care Management Obs Status (Signed)
[facility] NOTIFICATION   Patient Details  Name: BENJIE CAVASOS MRN: [id number] Date of Birth: 1929/11/27   Medicare Observation Status Notification Given:  Yes    Pollie Friar, RN 03/07/2019, 11:08 AM

## 2019-03-07 NOTE — Progress Notes (Signed)
  Echocardiogram 2D Echocardiogram has been performed.  Jennette Dubin 03/07/2019, 11:47 AM

## 2019-03-07 NOTE — Consult Note (Signed)
   Patient name: Daniel Woodard MRN: [id number] DOB: 1929/06/29 Sex: male    HPI: Daniel Woodard is a 85 y.o. male seen at the outlying ER yesterday with dizziness.  I was called and reviewed his CT scan showing proximal left subclavian occlusion.  He is well-known to our practice from prior left carotid endarterectomy in 2001.  Has had yearly serial duplex follow-up since then.  Has known chronic left subclavian occlusion with retrograde flow in his left vertebral artery.  Current Facility-Administered Medications  Medication Dose Route Frequency Provider Last Rate Last Dose  . 0.9 %  sodium chloride infusion   Intravenous Continuous Rise Patience, MD 50 mL/hr at 03/07/19 0348    . acetaminophen (TYLENOL) tablet 650 mg  650 mg Oral Q4H PRN Rise Patience, MD       Or  . acetaminophen (TYLENOL) solution 650 mg  650 mg Per Tube Q4H PRN Rise Patience, MD       Or  . acetaminophen (TYLENOL) suppository 650 mg  650 mg Rectal Q4H PRN Rise Patience, MD      . aspirin suppository 300 mg  300 mg Rectal Daily Rise Patience, MD       Or  . aspirin tablet 325 mg  325 mg Oral Daily Rise Patience, MD   325 mg at 03/07/19 1012  . enoxaparin (LOVENOX) injection 40 mg  40 mg Subcutaneous Q24H Rise Patience, MD   40 mg at 03/06/19 2102  . insulin aspart (novoLOG) injection 0-9 Units  0-9 Units Subcutaneous TID WC Rise Patience, MD      . metoprolol succinate (TOPROL-XL) 24 hr tablet 25 mg  25 mg Oral Daily Rise Patience, MD   25 mg at 03/07/19 1012  . pantoprazole (PROTONIX) EC tablet 40 mg  40 mg Oral Daily Rise Patience, MD   40 mg at 03/07/19 1012  . rosuvastatin (CRESTOR) tablet 20 mg  20 mg Oral QHS Rise Patience, MD   20 mg at 03/06/19 2101  . senna-docusate (Senokot-S) tablet 1 tablet  1 tablet Oral QHS PRN Rise Patience, MD         PHYSICAL EXAM: Vitals:   03/06/19 2309 03/07/19  0315 03/07/19 0745 03/07/19 1134  BP: 122/71 120/67 119/75 105/71  Pulse: 66 71 62 70  Resp: 18 18 20 16   Temp: 97.8 F (36.6 C) 97.7 F (36.5 C) (!) 97.5 F (36.4 C) 97.7 F (36.5 C)  TempSrc: Oral Oral Oral Oral  SpO2: 98% 98% 97% 96%    GENERAL: The patient is a well-nourished male, in no acute distress. The vital signs are documented above. Alert and oriented with no neurologic deficits.  Well-healed left neck incision  MEDICAL ISSUES: Stable chronic left proximal subclavian artery occlusion with retrograde flow in his left vertebral artery.  This has never caused symptoms and do not feel that this is related to his single syncopal event.  His daughter present reports that he was very pale in color and had diarrhea of the complete recovery after resuscitation with volume.  We will continue to follow his carotid and vertebral issues with 9 invasive surveillance.  He is scheduled for follow-up in the next several weeks.  We will cancel this and see him in 1 year   Rosetta Posner, MD [facility] Tel [phone] Pager [phone]

## 2019-03-07 NOTE — Evaluation (Signed)
Physical Therapy Evaluation Patient Details Name: Daniel Woodard MRN: [id number] DOB: 03/05/1930 Today's Date: 03/07/2019   History of Present Illness  CLEVIE DO is a 85 y.o. male with history of complete heart block status post pacemaker placement, hypertension, chronic kidney disease, diabetes mellitus type 2 was brought to the ER after patient has been having persistent dizziness CTA head/neck: Extensive calcified plaque results in either segmental occlusion or segmental high-grade stenosis of the proximal left subclavian  Clinical Impression  Patient evaluated by Physical Therapy with no further acute PT needs identified. Pt reports he is back at functional baseline; dizziness could not be reproduced upon examination and orthostatic vital signs negative (see vitals flow sheet). Pt ambulating hallway distances without physical difficulty. All education has been completed and the patient has no further questions. No follow-up Physical Therapy or equipment needs. PT is signing off. Thank you for this referral.     Follow Up Recommendations No PT follow up    Equipment Recommendations  None recommended by PT    Recommendations for Other Services       Precautions / Restrictions Precautions Precautions: None Restrictions Weight Bearing Restrictions: No      Mobility  Bed Mobility Overal bed mobility: Independent                Transfers Overall transfer level: Independent Equipment used: None                Ambulation/Gait Ambulation/Gait assistance: Modified independent (Device/Increase time) Gait Distance (Feet): 250 Feet Assistive device: None Gait Pattern/deviations: Step-through pattern;Wide base of support     General Gait Details: No gross unsteadiness or overt LOB  Stairs            Wheelchair Mobility    Modified Rankin (Stroke Patients Only)       Balance Overall balance assessment: Mild deficits observed, not formally tested                                            Pertinent Vitals/Pain Pain Assessment: No/denies pain    Home Living Family/patient expects to be discharged to:: Private residence Living Arrangements: Alone Available Help at Discharge: Family;Available PRN/intermittently Type of Home: House Home Access: Level entry     Home Layout: One level Home Equipment: Walker - 2 wheels;Wheelchair - manual;Shower seat      Prior Function Level of Independence: Independent               Hand Dominance        Extremity/Trunk Assessment   Upper Extremity Assessment Upper Extremity Assessment: Overall WFL for tasks assessed    Lower Extremity Assessment Lower Extremity Assessment: Overall WFL for tasks assessed    Cervical / Trunk Assessment Cervical / Trunk Assessment: Normal  Communication   Communication: No difficulties  Cognition Arousal/Alertness: Awake/alert Behavior During Therapy: WFL for tasks assessed/performed Overall Cognitive Status: Within Functional Limits for tasks assessed                                        General Comments      Exercises     Assessment/Plan    PT Assessment Patent does not need any further PT services  PT Problem List Decreased balance;Decreased mobility  PT Treatment Interventions      PT Goals (Current goals can be found in the Care Plan section)  Acute Rehab PT Goals Patient Stated Goal: likes playing golf and fishing PT Goal Formulation: All assessment and education complete, DC therapy    Frequency     Barriers to discharge        Co-evaluation PT/OT/SLP Co-Evaluation/Treatment: Yes Reason for Co-Treatment: To address functional/ADL transfers PT goals addressed during session: Mobility/safety with mobility         AM-PAC PT "6 Clicks" Mobility  Outcome Measure Help needed turning from your back to your side while in a flat bed without using bedrails?: None Help needed  moving from lying on your back to sitting on the side of a flat bed without using bedrails?: None Help needed moving to and from a bed to a chair (including a wheelchair)?: None Help needed standing up from a chair using your arms (e.g., wheelchair or bedside chair)?: None Help needed to walk in hospital room?: None Help needed climbing 3-5 steps with a railing? : A Little 6 Click Score: 23    End of Session Equipment Utilized During Treatment: Gait belt Activity Tolerance: Patient tolerated treatment well Patient left: in chair;with call bell/phone within reach;with chair alarm set Nurse Communication: Mobility status PT Visit Diagnosis: Difficulty in walking, not elsewhere classified (R26.2)    Time: [id number] PT Time Calculation (min) (ACUTE ONLY): 21 min   Charges:   PT Evaluation $PT Eval Moderate Complexity: 1 Mod          Ellamae Sia, PT, DPT Acute Rehabilitation Services Pager [phone] Office [phone]   Willy Eddy 03/07/2019, 9:42 AM

## 2019-03-07 NOTE — TOC Initial Note (Signed)
Transition of Care [facility]) - Initial/Assessment Note    Patient Details  Name: BEJAMIN HACKBART MRN: [id number] Date of Birth: [birth year]  [facility]) CM/SW Contact:    Pollie Friar, RN Phone Number: 03/07/2019, 11:39 AM  Clinical Narrative:                 No f/u per PT and no DME needs. Awaiting OT/ST evals.  Pt denies any issues with home medications and transportation. TOC following for d/c needs.   Expected Discharge Plan: Home/Self Care Barriers to Discharge: Continued Medical Work up   Patient Goals and CMS Choice        Expected Discharge Plan and Services Expected Discharge Plan: Home/Self Care       Living arrangements for the past 2 months: Single Family Home                                      Prior Living Arrangements/Services Living arrangements for the past 2 months: Single Family Home Lives with:: Self Patient language and need for interpreter reviewed:: Yes(no needs) Do you feel safe going back to the place where you live?: Yes      Need for Family Participation in Patient Care: Yes (Comment) Care giver support system in place?: Yes (comment)(daughters are available to provide supervision)   Criminal Activity/Legal Involvement Pertinent to Current Situation/Hospitalization: No - Comment as needed  Activities of Daily Living      Permission Sought/Granted                  Emotional Assessment Appearance:: Appears stated age Attitude/Demeanor/Rapport: Engaged Affect (typically observed): Accepting, Pleasant Orientation: : Oriented to Place, Oriented to  Time, Oriented to Situation, Oriented to Self   Psych Involvement: No (comment)  Admission diagnosis:  Dizziness [R42] Patient Active Problem List   Diagnosis Date Noted  . Renal insufficiency 11/16/2017  . Skin lesion of scalp 05/18/2017  . COPD exacerbation ([place]) 11/20/2015  . Choroidal neovascularization of left eye 07/17/2015  . Pseudophakia of both eyes  07/17/2015  . Vitelliform lesion of macula 07/17/2015  . Vitreomacular adhesion of right eye 07/17/2015  . Insomnia 05/22/2015  . Cortical age-related cataract of left eye 05/19/2015  . Nuclear sclerotic cataract of left eye 05/19/2015  . Cataract, mature 04/28/2015  . Olecranon bursitis of right elbow 02/03/2015  . Pacemaker 10/10/2013  . Carotid artery occlusion 09/19/2013  . Aftercare following surgery of the circulatory system, [place] 09/19/2013  . Second degree AV block 07/09/2013  . Preventative health care 11/17/2010  . BRADYCARDIA 05/11/2009  . Dizziness 10/06/2008  . Cough 03/27/2008  . PNEUMONIA ORGANISM NOS 11/08/2007  . Diabetes ([place]) 10/09/2007  . SHOULDER PAIN, BILATERAL 08/01/2007  . Shoulder joint pain 08/01/2007  . BACTEREMIA, 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  . Depression 04/09/2007  . Allergic rhinitis 04/09/2007  . COPD GOLD II/ emphysematous features 04/09/2007  . Diverticulosis of colon 04/09/2007  . Benign prostatic hyperplasia 04/09/2007  . History of colonic polyps 04/09/2007  . SHINGLES, HX OF 04/09/2007  . Hyperlipidemia 03/27/2007  . Essential hypertension 03/27/2007  . Peripheral vascular disease ([place]) 03/27/2007   PCP:  Biagio Borg, MD Pharmacy:   [place], [place] [address] Phone: [phone] Fax: [phone]     Social Determinants of Health (SDOH) Interventions  Readmission Risk Interventions No flowsheet data found.

## 2019-03-08 LAB — GLUCOSE, CAPILLARY: Glucose-Capillary: 159 mg/dL — ABNORMAL HIGH (ref 70–99)

## 2019-04-11 ENCOUNTER — Ambulatory Visit (INDEPENDENT_AMBULATORY_CARE_PROVIDER_SITE_OTHER): Payer: Medicare Other | Admitting: *Deleted

## 2019-04-11 DIAGNOSIS — I441 Atrioventricular block, second degree: Secondary | ICD-10-CM | POA: Diagnosis not present

## 2019-04-12 LAB — CUP PACEART REMOTE DEVICE CHECK
Battery Impedance: 499 Ohm
Battery Remaining Longevity: 85 mo
Battery Voltage: 2.76 V
Brady Statistic AP VP Percent: 21 %
Brady Statistic AP VS Percent: 13 %
Brady Statistic AS VP Percent: 50 %
Brady Statistic AS VS Percent: 16 %
Date Time Interrogation Session: 20201113140551
Implantable Lead Implant Date: 20150212
Implantable Lead Implant Date: 20150212
Implantable Lead Location: 753859
Implantable Lead Location: 753860
Implantable Lead Model: 5076
Implantable Lead Model: 5076
Implantable Pulse Generator Implant Date: 20150212
Lead Channel Impedance Value: 444 Ohm
Lead Channel Impedance Value: 498 Ohm
Lead Channel Pacing Threshold Amplitude: 0.75 V
Lead Channel Pacing Threshold Amplitude: 0.75 V
Lead Channel Pacing Threshold Pulse Width: 0.4 ms
Lead Channel Pacing Threshold Pulse Width: 0.4 ms
Lead Channel Setting Pacing Amplitude: 2 V
Lead Channel Setting Pacing Amplitude: 2.5 V
Lead Channel Setting Pacing Pulse Width: 0.4 ms
Lead Channel Setting Sensing Sensitivity: 4 mV

## 2019-04-19 ENCOUNTER — Other Ambulatory Visit: Payer: Self-pay | Admitting: Internal Medicine

## 2019-05-03 NOTE — Progress Notes (Signed)
Remote pacemaker transmission.   

## 2019-05-14 ENCOUNTER — Other Ambulatory Visit (INDEPENDENT_AMBULATORY_CARE_PROVIDER_SITE_OTHER): Payer: Medicare Other

## 2019-05-14 DIAGNOSIS — E538 Deficiency of other specified B group vitamins: Secondary | ICD-10-CM | POA: Diagnosis not present

## 2019-05-14 DIAGNOSIS — E119 Type 2 diabetes mellitus without complications: Secondary | ICD-10-CM

## 2019-05-14 DIAGNOSIS — E611 Iron deficiency: Secondary | ICD-10-CM | POA: Diagnosis not present

## 2019-05-14 DIAGNOSIS — E559 Vitamin D deficiency, unspecified: Secondary | ICD-10-CM | POA: Diagnosis not present

## 2019-05-14 LAB — VITAMIN D 25 HYDROXY (VIT D DEFICIENCY, FRACTURES): VITD: 31.27 ng/mL (ref 30.00–100.00)

## 2019-05-14 LAB — LIPID PANEL
Cholesterol: 150 mg/dL (ref 0–200)
HDL: 32.6 mg/dL — ABNORMAL LOW (ref >39.00)
LDL Cholesterol: 100 mg/dL — ABNORMAL HIGH (ref 0–99)
NonHDL: 117.7
Total CHOL/HDL Ratio: 5
Triglycerides: 87 mg/dL (ref 0.0–149.0)
VLDL: 17.4 mg/dL (ref 0.0–40.0)

## 2019-05-14 LAB — BASIC METABOLIC PANEL
BUN: 28 mg/dL — ABNORMAL HIGH (ref 6–23)
CO2: 25 mEq/L (ref 19–32)
Calcium: 8.7 mg/dL (ref 8.4–10.5)
Chloride: 103 mEq/L (ref 96–112)
Creatinine, Ser: 1.44 mg/dL (ref 0.40–1.50)
GFR: 46.38 mL/min — ABNORMAL LOW (ref >60.00)
Glucose, Bld: 122 mg/dL — ABNORMAL HIGH (ref 70–99)
Potassium: 4.2 mEq/L (ref 3.5–5.1)
Sodium: 137 mEq/L (ref 135–145)

## 2019-05-14 LAB — IBC PANEL
Iron: 54 ug/dL (ref 42–165)
Saturation Ratios: 13.9 % — ABNORMAL LOW (ref 20.0–50.0)
Transferrin: 277 mg/dL (ref 212.0–360.0)

## 2019-05-14 LAB — HEPATIC FUNCTION PANEL
ALT: 15 U/L (ref 0–53)
AST: 15 U/L (ref 0–37)
Albumin: 4 g/dL (ref 3.5–5.2)
Alkaline Phosphatase: 71 U/L (ref 39–117)
Bilirubin, Direct: 0.2 mg/dL (ref 0.0–0.3)
Total Bilirubin: 0.6 mg/dL (ref 0.2–1.2)
Total Protein: 7.3 g/dL (ref 6.0–8.3)

## 2019-05-14 LAB — VITAMIN B12: Vitamin B-12: 539 pg/mL (ref 211–911)

## 2019-05-14 LAB — HEMOGLOBIN A1C: Hgb A1c MFr Bld: 6.2 % (ref 4.6–6.5)

## 2019-05-22 ENCOUNTER — Ambulatory Visit (INDEPENDENT_AMBULATORY_CARE_PROVIDER_SITE_OTHER): Payer: Medicare Other | Admitting: Internal Medicine

## 2019-05-22 ENCOUNTER — Encounter: Payer: Self-pay | Admitting: Internal Medicine

## 2019-05-22 ENCOUNTER — Other Ambulatory Visit: Payer: Self-pay

## 2019-05-22 VITALS — BP 130/90 | HR 92 | Temp 97.6°F | Ht 73.0 in | Wt 199.0 lb

## 2019-05-22 DIAGNOSIS — E785 Hyperlipidemia, unspecified: Secondary | ICD-10-CM

## 2019-05-22 DIAGNOSIS — E119 Type 2 diabetes mellitus without complications: Secondary | ICD-10-CM | POA: Diagnosis not present

## 2019-05-22 DIAGNOSIS — I10 Essential (primary) hypertension: Secondary | ICD-10-CM

## 2019-05-22 MED ORDER — METOPROLOL SUCCINATE ER 25 MG PO TB24: 12.5000 mg | ORAL_TABLET | Freq: Every day | ORAL | 3 refills | Status: DC

## 2019-05-22 NOTE — Patient Instructions (Signed)
Please continue all other medications as before, and refills have been done if requested.  Please have the pharmacy call with any other refills you may need.  Please continue your efforts at being more active, low cholesterol diet, and weight control.  Please keep your appointments with your specialists as you may have planned  Please return in 6 months, or sooner if needed 

## 2019-06-01 ENCOUNTER — Encounter: Payer: Self-pay | Admitting: Internal Medicine

## 2019-06-01 NOTE — Progress Notes (Signed)
Subjective:    Patient ID: Daniel Woodard, male    DOB: Nov 03, 1929, 86 y.o.   MRN: [id number]  HPI  Here to f/u; overall doing ok,  Pt denies chest pain, increasing sob or doe, wheezing, orthopnea, PND, increased LE swelling, palpitations, dizziness or syncope.  Pt denies new neurological symptoms such as new headache, or facial or extremity weakness or numbness.  Pt denies polydipsia, polyuria, or low sugar episode.  Pt states overall good compliance with meds, mostly trying to follow appropriate diet, with wt overall stable,  but little exercise however.  Dizziness in October resolved with decreased metoprolol to 12.5 bid and losartan stopped.  No new complaints Past Medical History:  Diagnosis Date  . ALLERGIC RHINITIS 04/09/2007  . Atrial fibrillation ([place])   . BACTEREMIA, 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  . BENIGN PROSTATIC HYPERTROPHY 04/09/2007  . Blood transfusion without reported diagnosis    really not sure thinks 30-40 years ago  . BRADYCARDIA 05/11/2009  . Carotid artery occlusion   . COLONIC POLYPS, HX OF 04/09/2007   ADENOMATOUS POLYPS 7588,3254  . Complete heart block ([place])   . COPD 04/09/2007  . Cough 03/27/2008  . DEPRESSION 04/09/2007  . DIABETES MELLITUS, TYPE II 10/09/2007  . DIVERTICULOSIS, COLON 04/09/2007  . DIZZINESS 10/06/2008  . GLUCOSE INTOLERANCE 04/09/2007  . HYPERLIPIDEMIA 03/27/2007  . HYPERTENSION 03/27/2007  . PERIPHERAL VASCULAR DISEASE 03/27/2007  . PNEUMONIA ORGANISM NOS 11/08/2007  . SHINGLES, HX OF 04/09/2007  . SHOULDER PAIN, BILATERAL 08/01/2007  . Ulcer    40 years ago   Past Surgical History:  Procedure Laterality Date  . APPENDECTOMY     86 years old  . CAROTID ENDARTERECTOMY  5/08   left  . CARPAL TUNNEL RELEASE     bilateral  . COLONOSCOPY    . ESOPHAGOGASTRODUODENOSCOPY  06/07/2004  . HEMORRHOID BANDING  1990s  . PACEMAKER INSERTION  07/11/2013   MDT ADDRL1 pacemaker implanted by Dr Lovena Le for complete heart block  .  PERMANENT PACEMAKER INSERTION N/A 07/11/2013   Procedure: PERMANENT PACEMAKER INSERTION;  Surgeon: Evans Lance, MD;  Location: [facility] C[facility] LAB;  Service: Cardiovascular;  Laterality: N/A;  . s/p PUD surgury     ? partial gastrectomy    reports that he quit smoking about 19 years ago. His smoking use included cigarettes. He has a 50.00 pack-year smoking history. He has never used smokeless tobacco. He reports that he does not drink alcohol or use drugs. family history includes Heart disease in his sister and another family member; Hypertension in his father, mother, sister, sister, sister, and sister; Lung cancer in his sister; Peripheral vascular disease in his brother; Stroke in an other family member; Ulcers in his father and sister. Allergies  Allergen Reactions  . Ace Inhibitors Anaphylaxis    REACTION: cough REACTION: cough REACTION: cough REACTION: cough   Current Outpatient Medications on File Prior to Visit  Medication Sig Dispense Refill  . aspirin 325 MG tablet Take 1 tablet (325 mg total) by mouth daily.    . Coenzyme Q10 (COQ-10) 200 MG CAPS Take 200 mg by mouth daily.     Marland Kitchen omeprazole (PRILOSEC) 20 MG capsule Take 1 capsule by mouth once daily 90 capsule 1  . psyllium (METAMUCIL) 58.6 % powder Take 2 caps by mouth every day    . rosuvastatin (CRESTOR) 20 MG tablet TAKE 1 TABLET BY MOUTH AT BEDTIME (Patient taking differently: Take 20 mg by mouth at bedtime. ) 90 tablet 0  No current facility-administered medications on file prior to visit.   Review of Systems  Constitutional: Negative for other unusual diaphoresis or sweats HENT: Negative for ear discharge or swelling Eyes: Negative for other worsening visual disturbances Respiratory: Negative for stridor or other swelling  Gastrointestinal: Negative for worsening distension or other blood Genitourinary: Negative for retention or other urinary change Musculoskeletal: Negative for other MSK pain or swelling Skin: Negative  for color change or other new lesions Neurological: Negative for worsening tremors and other numbness  Psychiatric/Behavioral: Negative for worsening agitation or other fatigue All otherwise neg per pt     Objective:   Physical Exam BP 130/90   Pulse 92   Temp 97.6 F (36.4 C) (Oral)   Ht _0  (1.854 m)   Wt 199 lb (90.3 kg)   SpO2 95%   BMI 26.25 kg/m  VS noted,  Constitutional: Pt appears in NAD HENT: Head: NCAT.  Right Ear: External ear normal.  Left Ear: External ear normal.  Eyes: . Pupils are equal, round, and reactive to light. Conjunctivae and EOM are normal Nose: without d/c or deformity Neck: Neck supple. Gross normal ROM Cardiovascular: Normal rate and regular rhythm.   Pulmonary/Chest: Effort normal and breath sounds without rales or wheezing.  Abd:  Soft, NT, ND, + BS, no organomegaly Neurological: Pt is alert. At baseline orientation, motor grossly intact Skin: Skin is warm. No rashes, other new lesions, no LE edema Psychiatric: Pt behavior is normal without agitation  All otherwise neg per pt Lab Results  Component Value Date   WBC 7.7 03/07/2019   HGB 14.4 03/07/2019   HCT 42.2 03/07/2019   PLT 140 (L) 03/07/2019   GLUCOSE 122 (H) 05/14/2019   CHOL 150 05/14/2019   TRIG 87.0 05/14/2019   HDL 32.60 (L) 05/14/2019   LDLDIRECT 189.9 10/02/2007   LDLCALC 100 (H) 05/14/2019   ALT 15 05/14/2019   AST 15 05/14/2019   NA 137 05/14/2019   K 4.2 05/14/2019   CL 103 05/14/2019   CREATININE 1.44 05/14/2019   BUN 28 (H) 05/14/2019   CO2 25 05/14/2019   TSH 4.35 11/15/2018   PSA 0.65 05/18/2012   INR 1.1 03/06/2019   HGBA1C 6.2 05/14/2019   MICROALBUR 5.2 (H) 11/15/2018         Assessment & Plan:

## 2019-06-01 NOTE — Assessment & Plan Note (Signed)
stable overall by history and exam, recent data reviewed with pt, and pt to continue medical treatment as before,  to f/u any worsening symptoms or concerns  

## 2019-06-04 ENCOUNTER — Other Ambulatory Visit: Payer: Self-pay | Admitting: Internal Medicine

## 2019-06-04 ENCOUNTER — Other Ambulatory Visit: Payer: Self-pay

## 2019-06-04 MED ORDER — ROSUVASTATIN CALCIUM 20 MG PO TABS: 20.0000 mg | ORAL_TABLET | Freq: Every day | ORAL | 0 refills | Status: DC

## 2019-06-13 ENCOUNTER — Telehealth: Payer: Self-pay | Admitting: Internal Medicine

## 2019-06-13 ENCOUNTER — Ambulatory Visit: Payer: Medicare Other | Attending: Internal Medicine

## 2019-06-13 DIAGNOSIS — Z23 Encounter for immunization: Secondary | ICD-10-CM

## 2019-06-13 NOTE — Telephone Encounter (Signed)
Dr wert would be ok, thanks

## 2019-06-13 NOTE — Telephone Encounter (Signed)
Copied from [place] [phone]. Topic: Referral - Request for Referral >> Jun 13, 2019 11:35 AM Rainey Pines A wrote: Patients daughter would like to know who Dr. Jenny Reichmann what pulmonary doctor he would advise patient to see. Please advise

## 2019-06-13 NOTE — Progress Notes (Signed)
   Covid-19 Vaccination Clinic  Name:  Daniel Woodard    MRN: [id number] DOB: 10-22-1929  06/13/2019  [name] was observed post Covid-19 immunization for 15 minutes without incidence. He was provided with Vaccine Information Sheet and instruction to access the V-Safe system.   [name] was instructed to call 911 with any severe reactions post vaccine: Marland Kitchen Difficulty breathing  . Swelling of your face and throat  . A fast heartbeat  . A bad rash all over your body  . Dizziness and weakness    Immunizations Administered    Name Date Dose VIS Date Route   Pfizer COVID-19 Vaccine 06/13/2019 10:19 AM 0.3 mL 05/10/2019 Intramuscular   Manufacturer: [employer], [employer]   Lot: [id number]   [place]: [id number]

## 2019-06-13 NOTE — Telephone Encounter (Signed)
Left detailed message informing pt's daughter, Jeannene Patella of below.

## 2019-07-02 ENCOUNTER — Encounter: Payer: Self-pay | Admitting: Internal Medicine

## 2019-07-03 ENCOUNTER — Ambulatory Visit: Payer: Medicare Other | Attending: Internal Medicine

## 2019-07-03 DIAGNOSIS — Z23 Encounter for immunization: Secondary | ICD-10-CM | POA: Insufficient documentation

## 2019-07-03 NOTE — Progress Notes (Signed)
   Covid-19 Vaccination Clinic  Name:  Daniel Woodard    MRN: [id number] DOB: 1929-09-24  07/03/2019  Daniel Woodard was observed post Covid-19 immunization for 15 minutes without incidence. He was provided with Vaccine Information Sheet and instruction to access the V-Safe system.   Daniel Woodard was instructed to call 911 with any severe reactions post vaccine: Marland Kitchen Difficulty breathing  . Swelling of your face and throat  . A fast heartbeat  . A bad rash all over your body  . Dizziness and weakness    Immunizations Administered    Name Date Dose VIS Date Route   Pfizer COVID-19 Vaccine 07/03/2019 10:29 AM 0.3 mL 05/10/2019 Intramuscular   Manufacturer: [place]   Lot: [id number]   [place]: [id number]

## 2019-07-05 ENCOUNTER — Ambulatory Visit: Payer: Medicare Other | Admitting: Internal Medicine

## 2019-07-05 ENCOUNTER — Other Ambulatory Visit: Payer: Self-pay

## 2019-07-05 ENCOUNTER — Encounter: Payer: Self-pay | Admitting: Internal Medicine

## 2019-07-05 ENCOUNTER — Ambulatory Visit (INDEPENDENT_AMBULATORY_CARE_PROVIDER_SITE_OTHER): Payer: Medicare Other

## 2019-07-05 DIAGNOSIS — R918 Other nonspecific abnormal finding of lung field: Secondary | ICD-10-CM

## 2019-07-05 DIAGNOSIS — R0600 Dyspnea, unspecified: Secondary | ICD-10-CM

## 2019-07-05 DIAGNOSIS — J449 Chronic obstructive pulmonary disease, unspecified: Secondary | ICD-10-CM

## 2019-07-05 DIAGNOSIS — R05 Cough: Secondary | ICD-10-CM | POA: Diagnosis not present

## 2019-07-05 DIAGNOSIS — R0609 Other forms of dyspnea: Secondary | ICD-10-CM

## 2019-07-05 MED ORDER — BREZTRI AEROSPHERE 160-9-4.8 MCG/ACT IN AERO: 2.0000 | INHALATION_SPRAY | Freq: Two times a day (BID) | RESPIRATORY_TRACT | 0 refills | Status: DC

## 2019-07-05 MED ORDER — BREZTRI AEROSPHERE 160-9-4.8 MCG/ACT IN AERO: 2.0000 | INHALATION_SPRAY | Freq: Two times a day (BID) | RESPIRATORY_TRACT | 11 refills | Status: DC

## 2019-07-05 NOTE — Patient Instructions (Addendum)
Breztri Take 2 puffs first thing in am and then another 2 puffs about 12 hours later.   Work on inhaler technique:  relax and gently blow all the way out then take a nice smooth deep breath back in, triggering the inhaler at same time you start breathing in.  Hold for up to 5 seconds if you can. Blow out thru nose. Rinse and gargle with water when done        Please remember to go to the lab and x-ray department   for your tests - we will call you with the results when they are available.       Please schedule a follow up office visit in 6 weeks, call sooner if needed

## 2019-07-05 NOTE — Progress Notes (Signed)
MACEDONIO SCALLON, male    DOB: 09-Oct-1929    MRN: [id number]   Brief patient profile:  56 yowm quit smoking 2001 self referred 08/11/14 to pulmonary clinic with GOLD II/ copd documented 11/28/2014 @ MMRC=1 and lost to f/u on no  maint resp rx  Then early fall 2020 gradually worse sob        History of Present Illness  07/05/2019  Pulmonary/ 1st office eval/Jonaya Freshour  Chief Complaint  Patient presents with  . Consult    Patient is having shortness of breath with exertion and daily activites that started 6 months ago. Patient has a productive cough with grey sputum.   Dyspnea:  500 ft  To mailbox and has to sit on patio to get his breath = MMRC2 = can't walk a nl pace on a flat grade s sob but does fine slow and flat  Cough: varies mostly daytime / rattle min mucoid to brown occ in am  Sleep:  30-40 degrees on pillows   SABA use: none   No obvious day to day or daytime variability or assoc excess/ purulent sputum or mucus plugs or hemoptysis or cp or chest tightness, subjective wheeze or overt sinus or hb symptoms.   Sleeping as above without nocturnal    exacerbation  of respiratory  c/o's or need for noct saba. Also denies any obvious fluctuation of symptoms with weather or environmental changes or other aggravating or alleviating factors except as outlined above   No unusual exposure hx or h/o childhood pna/ asthma or knowledge of premature birth.  Current Allergies, Complete Past Medical History, Past Surgical History, Family History, and Social History were reviewed in [employer] record.  ROS  The following are not active complaints unless bolded Hoarseness, sore throat, dysphagia, dental problems, itching, sneezing,  nasal congestion or discharge of excess mucus or purulent secretions, ear ache,   fever, chills, sweats, unintended wt loss or wt gain, classically pleuritic or exertional cp,  orthopnea pnd or arm/hand swelling  or leg swelling, presyncope, palpitations,  abdominal pain, anorexia, nausea, vomiting, diarrhea  or change in bowel habits or change in bladder habits, change in stools or change in urine, dysuria, hematuria,  rash, arthralgias, visual complaints, headache, numbness, weakness or ataxia or problems with walking or coordination,  change in mood or  memory.           Past Medical History:  Diagnosis Date  . ALLERGIC RHINITIS 04/09/2007  . Atrial fibrillation ([place])   . BACTEREMIA, 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  . BENIGN PROSTATIC HYPERTROPHY 04/09/2007  . Blood transfusion without reported diagnosis    really not sure thinks 30-40 years ago  . BRADYCARDIA 05/11/2009  . Carotid artery occlusion   . COLONIC POLYPS, HX OF 04/09/2007   ADENOMATOUS POLYPS 5361,4431  . Complete heart block ([place])   . COPD 04/09/2007  . Cough 03/27/2008  . DEPRESSION 04/09/2007  . DIABETES MELLITUS, TYPE II 10/09/2007  . DIVERTICULOSIS, COLON 04/09/2007  . DIZZINESS 10/06/2008  . GLUCOSE INTOLERANCE 04/09/2007  . HYPERLIPIDEMIA 03/27/2007  . HYPERTENSION 03/27/2007  . PERIPHERAL VASCULAR DISEASE 03/27/2007  . PNEUMONIA ORGANISM NOS 11/08/2007  . SHINGLES, HX OF 04/09/2007  . SHOULDER PAIN, BILATERAL 08/01/2007  . Ulcer    40 years ago    Outpatient Medications Prior to Visit  Medication Sig Dispense Refill  . aspirin 325 MG tablet Take 1 tablet (325 mg total) by mouth daily.    . Coenzyme Q10 (COQ-10) 200 MG  CAPS Take 200 mg by mouth daily.     . metoprolol succinate (TOPROL-XL) 25 MG 24 hr tablet Take 0.5 tablets (12.5 mg total) by mouth daily. 45 tablet 3  . omeprazole (PRILOSEC) 20 MG capsule Take 1 capsule by mouth once daily 90 capsule 1  . psyllium (METAMUCIL) 58.6 % powder Take 2 caps by mouth every day    . rosuvastatin (CRESTOR) 20 MG tablet Take 1 tablet (20 mg total) by mouth at bedtime. 90 tablet 0         Objective:     BP 120/88 (BP Location: Left Arm, Patient Position: Sitting, Cuff Size: Normal)   Pulse 85   Temp  98.3 F (36.8 C) (Temporal)   Ht _0  (1.854 m)   Wt 194 lb (88 kg)   SpO2 95%   BMI 25.60 kg/m   SpO2: 95 %   Full dentures   HEENT : pt wearing mask not removed for exam due to covid - 19 concerns.    NECK :  without JVD/Nodes/TM/ nl carotid upstrokes bilaterally   LUNGS: no acc muscle use,  Mild barrel  contour chest wall with mod pectus excavatum and  with bilateral  Coarse sounding exp rhonchi  and  without cough on insp or exp maneuvers  and mild  Hyperresonant  to  percussion bilaterally     CV:  RRR  no s3 or murmur or increase in P2, and  R > L pitting lower ext edema  ABD:  soft and nontender with pos end  insp Hoover's  in the supine position. No bruits or organomegaly appreciated, bowel sounds nl  MS:   Nl gait/  ext warm without deformities, calf tenderness, cyanosis or clubbing No obvious joint restrictions   SKIN: warm and dry without lesions    NEURO:  alert, approp, nl sensorium with  no motor or cerebellar deficits apparent.       CXR PA and Lateral:   07/05/2019 :    I personally reviewed images and  impression as follows:   Interval development multiple patchy opacities in both lung bases which may represent atelectasis or possibly multifocal pneumonia.    Labs ordered/ reviewed:      Chemistry      Component Value Date/Time   NA 139 07/05/2019 1538   K 4.2 07/05/2019 1538   CL 102 07/05/2019 1538   CO2 24 07/05/2019 1538   BUN 27 (H) 07/05/2019 1538   CREATININE 1.39 (H) 07/05/2019 1538      Component Value Date/Time   CALCIUM 9.0 07/05/2019 1538   ALKPHOS 71 05/14/2019 0747   AST 15 05/14/2019 0747   ALT 15 05/14/2019 0747   BILITOT 0.6 05/14/2019 0747        Lab Results  Component Value Date   WBC 7.2 07/05/2019   HGB 15.2 07/05/2019   HCT 47.0 07/05/2019   MCV 87.0 07/05/2019   PLT 210 07/05/2019       EOS                                                               209  07/05/2019   Lab  Results  Component Value Date   DDIMER 0.63 (H) 07/05/2019      Lab Results  Component Value Date   TSH 3.25 07/05/2019       PROBNP  07/05/2019  = 415         Labs ordered 07/05/2019  :  allergy profile   alpha one AT phenotype        Assessment   COPD GOLD II/ emphysematous features Quit smoking 2001 - PFTs 11/28/14  FEV 2.00 (66%) ratio 63 and no change p saba, dlco 47 corrects to 69 for alv vol - 07/05/2019  After extensive coaching inhaler device,  effectiveness =    75% > try Breztri 2bid   DDX of  difficult airways management almost all start with A and  include Adherence, Ace Inhibitors, Acid Reflux, Active Sinus Disease, Alpha 1 Antitripsin deficiency, Anxiety masquerading as Airways dz,  ABPA,  Allergy(esp in young), Aspiration (esp in elderly), Adverse effects of meds,  Active smoking or vaping, A bunch of PE's (a small clot burden can't cause this syndrome unless there is already severe underlying pulm or vascular dz with poor reserve) plus two Bs  = Bronchiectasis and Beta blocker use..and one C= CHF    Adherence is always the initial "prime suspect" and is a multilayered concern that requires a "trust but verify" approach in every patient - starting with knowing how to use medications, especially inhalers, correctly, keeping up with refills and understanding the fundamental difference between maintenance and prns vs those medications only taken for a very short course and then stopped and not refilled.  - see hfa teaching above - return with all meds in hand using a trust but verify approach to confirm accurate Medication  Reconciliation The principal here is that until we are certain that the  patients are doing what we've asked, it makes no sense to ask them to do more.    ? Adverse drug effects ? > none of the usual suspects listed    ? Allergy /ab >  Group D in terms of symptom/risk and laba/lama/ICS  therefore appropriate rx at this point >>>  Breztri trial approp/  check IgE  ? Acid (or non-acid) GERD > always difficult to exclude as up to 75% of pts in some series report no assoc GI/ Heartburn symptoms> rec continue daily acid suppression - consider ppi bid on return   ? Aspiration > nothing to suggest at this point  ? Alpha one AT def > check levels  ? A bunch of pe's >  D dimer nl - while a normal  or high normal value (seen commonly in the elderly or chronically ill)  may miss small peripheral pe, the clot burden with sob is moderately high and the d dimer  has a very high neg pred value if used in this setting.    ? Anemia/ thyroid dz > ruled out today   ? BB effects > low doses of metaprolol probably ok here  ? CHF >  bnp noted, needs echo on return as on high side of indeterminate range.    DOE (dyspnea on exertion) BNP is in indeterminate range so will need echo on return as well as HRCT p course of levaquin 500 qd x 10  days  as does have abn cxr with reported discolored sputum though nothing to suggest acute pna by hx ? Could this be MAI?     Pulmonary infiltrates on CXR  Noted on cxr 07/05/2019 s acute changes clinically  - rec HRCT p levaquin 500 mg daily x 10 days    Each maintenance medication was reviewed in detail including emphasizing most importantly the difference between maintenance and prns and under what circumstances the prns are to be triggered using an action plan format where appropriate.  Total time for H and P, chart review, counseling, teaching device using teachback method, high level decision making, and generating customized AVS unique to this office visit / charting = 60 m        Christinia Gully, MD 07/07/2019

## 2019-07-06 ENCOUNTER — Encounter: Payer: Self-pay | Admitting: Internal Medicine

## 2019-07-06 NOTE — Assessment & Plan Note (Addendum)
Quit smoking 2001 - PFTs 11/28/14  FEV 2.00 (66%) ratio 63 and no change p saba, dlco 47 corrects to 69 for alv vol - 07/05/2019  After extensive coaching inhaler device,  effectiveness =    75% > try Breztri 2bid   DDX of  difficult airways management almost all start with A and  include Adherence, Ace Inhibitors, Acid Reflux, Active Sinus Disease, Alpha 1 Antitripsin deficiency, Anxiety masquerading as Airways dz,  ABPA,  Allergy(esp in young), Aspiration (esp in elderly), Adverse effects of meds,  Active smoking or vaping, A bunch of PE's (a small clot burden can't cause this syndrome unless there is already severe underlying pulm or vascular dz with poor reserve) plus two Bs  = Bronchiectasis and Beta blocker use..and one C= CHF    Adherence is always the initial "prime suspect" and is a multilayered concern that requires a "trust but verify" approach in every patient - starting with knowing how to use medications, especially inhalers, correctly, keeping up with refills and understanding the fundamental difference between maintenance and prns vs those medications only taken for a very short course and then stopped and not refilled.  - see hfa teaching above - return with all meds in hand using a trust but verify approach to confirm accurate Medication  Reconciliation The principal here is that until we are certain that the  patients are doing what we've asked, it makes no sense to ask them to do more.    ? Adverse drug effects ? > none of the usual suspects listed    ? Allergy /ab >  Group D in terms of symptom/risk and laba/lama/ICS  therefore appropriate rx at this point >>>  Breztri trial approp/ check IgE  ? Acid (or non-acid) GERD > always difficult to exclude as up to 75% of pts in some series report no assoc GI/ Heartburn symptoms> rec continue daily acid suppression - consider ppi bid on return   ? Aspiration > nothing to suggest at this point  ? Alpha one AT def > check levels  ? A  bunch of pe's >  D dimer nl - while a normal  or high normal value (seen commonly in the elderly or chronically ill)  may miss small peripheral pe, the clot burden with sob is moderately high and the d dimer  has a very high neg pred value if used in this setting.    ? Anemia/ thyroid dz > ruled out today   ? BB effects > low doses of metaprolol probably ok here  ? CHF >  bnp noted, needs echo on return as on high side of indeterminate range.           Each maintenance medication was reviewed in detail including emphasizing most importantly the difference between maintenance and prns and under what circumstances the prns are to be triggered using an action plan format where appropriate.  Total time for H and P, chart review, counseling, teaching device using teachback method, high level decision making, and generating customized AVS unique to this office visit / charting = 60 m

## 2019-07-07 DIAGNOSIS — J84112 Idiopathic pulmonary fibrosis: Secondary | ICD-10-CM | POA: Insufficient documentation

## 2019-07-07 NOTE — Assessment & Plan Note (Addendum)
BNP is in indeterminate range so will need echo on return as well as HRCT p course of levaquin 500 qd x 10  days  as does have abn cxr with reported discolored sputum though nothing to suggest acute pna by hx ? Could this be MAI?

## 2019-07-07 NOTE — Assessment & Plan Note (Signed)
Noted on cxr 07/05/2019 s acute changes clinically  - rec HRCT p levaquin 500 mg daily x 10 days

## 2019-07-08 ENCOUNTER — Other Ambulatory Visit: Payer: Self-pay | Admitting: Internal Medicine

## 2019-07-08 DIAGNOSIS — R0609 Other forms of dyspnea: Secondary | ICD-10-CM

## 2019-07-08 DIAGNOSIS — R0600 Dyspnea, unspecified: Secondary | ICD-10-CM

## 2019-07-08 DIAGNOSIS — R0602 Shortness of breath: Secondary | ICD-10-CM

## 2019-07-08 MED ORDER — LEVOFLOXACIN 500 MG PO TABS: 500.0000 mg | ORAL_TABLET | Freq: Every day | ORAL | 0 refills | Status: DC

## 2019-07-08 MED ORDER — PREDNISONE 10 MG PO TABS: ORAL_TABLET | ORAL | 0 refills | Status: DC

## 2019-07-08 NOTE — Progress Notes (Signed)
Spoke with pt and notified of results per Dr. Wert. Pt verbalized understanding and denied any questions. 

## 2019-07-08 NOTE — Telephone Encounter (Signed)
Dr. Melvyn Novas, please see mychart message from pt's daughter Helene Kelp and advise on it for pt.

## 2019-07-08 NOTE — Telephone Encounter (Signed)
There are a number of other options - this was just a first try and should continue taking the  [facility]  unless he's having side effects as it has the fast acting medication already in it (nothing is stronger in fact than the foroterol)   Would rec return to office with formulary in hand to see NP asap and ok to try Prednisone 10 mg take  4 each am x 2 days,   2 each am x 2 days,  1 each am x 2 days and stop pending ov

## 2019-07-11 ENCOUNTER — Ambulatory Visit (INDEPENDENT_AMBULATORY_CARE_PROVIDER_SITE_OTHER): Payer: Medicare Other | Admitting: *Deleted

## 2019-07-11 DIAGNOSIS — I441 Atrioventricular block, second degree: Secondary | ICD-10-CM | POA: Diagnosis not present

## 2019-07-11 LAB — CUP PACEART REMOTE DEVICE CHECK
Battery Impedance: 573 Ohm
Battery Remaining Longevity: 79 mo
Battery Voltage: 2.76 V
Brady Statistic AP VP Percent: 19 %
Brady Statistic AP VS Percent: 9 %
Brady Statistic AS VP Percent: 60 %
Brady Statistic AS VS Percent: 12 %
Date Time Interrogation Session: 20210211092233
Implantable Lead Implant Date: 20150212
Implantable Lead Implant Date: 20150212
Implantable Lead Location: 753859
Implantable Lead Location: 753860
Implantable Lead Model: 5076
Implantable Lead Model: 5076
Implantable Pulse Generator Implant Date: 20150212
Lead Channel Impedance Value: 437 Ohm
Lead Channel Impedance Value: 504 Ohm
Lead Channel Pacing Threshold Amplitude: 0.75 V
Lead Channel Pacing Threshold Amplitude: 0.875 V
Lead Channel Pacing Threshold Pulse Width: 0.4 ms
Lead Channel Pacing Threshold Pulse Width: 0.4 ms
Lead Channel Setting Pacing Amplitude: 2 V
Lead Channel Setting Pacing Amplitude: 2.5 V
Lead Channel Setting Pacing Pulse Width: 0.4 ms
Lead Channel Setting Sensing Sensitivity: 4 mV

## 2019-07-11 NOTE — Progress Notes (Signed)
PPM Remote  

## 2019-07-12 LAB — BASIC METABOLIC PANEL
BUN/Creatinine Ratio: 19 (calc) (ref 6–22)
BUN: 27 mg/dL — ABNORMAL HIGH (ref 7–25)
CO2: 24 mmol/L (ref 20–32)
Calcium: 9 mg/dL (ref 8.6–10.3)
Chloride: 102 mmol/L (ref 98–110)
Creat: 1.39 mg/dL — ABNORMAL HIGH (ref 0.70–1.11)
Glucose, Bld: 85 mg/dL (ref 65–99)
Potassium: 4.2 mmol/L (ref 3.5–5.3)
Sodium: 139 mmol/L (ref 135–146)

## 2019-07-12 LAB — CBC WITH DIFFERENTIAL/PLATELET
Absolute Monocytes: 1174 cells/uL — ABNORMAL HIGH (ref 200–950)
Basophils Absolute: 72 cells/uL (ref 0–200)
Basophils Relative: 1 %
Eosinophils Absolute: 209 cells/uL (ref 15–500)
Eosinophils Relative: 2.9 %
HCT: 47 % (ref 38.5–50.0)
Hemoglobin: 15.2 g/dL (ref 13.2–17.1)
Lymphs Abs: 1721 cells/uL (ref 850–3900)
MCH: 28.1 pg (ref 27.0–33.0)
MCHC: 32.3 g/dL (ref 32.0–36.0)
MCV: 87 fL (ref 80.0–100.0)
MPV: 9 fL (ref 7.5–12.5)
Monocytes Relative: 16.3 %
Neutro Abs: 4025 cells/uL (ref 1500–7800)
Neutrophils Relative %: 55.9 %
Platelets: 210 10*3/uL (ref 140–400)
RBC: 5.4 10*6/uL (ref 4.20–5.80)
RDW: 13 % (ref 11.0–15.0)
Total Lymphocyte: 23.9 %
WBC: 7.2 10*3/uL (ref 3.8–10.8)

## 2019-07-12 LAB — ALPHA-1 ANTITRYPSIN PHENOTYPE: A-1 Antitrypsin, Ser: 132 mg/dL (ref 83–199)

## 2019-07-12 LAB — IGE: IgE (Immunoglobulin E), Serum: 9 kU/L (ref <=114)

## 2019-07-12 LAB — BRAIN NATRIURETIC PEPTIDE: Brain Natriuretic Peptide: 415 pg/mL — ABNORMAL HIGH (ref <100)

## 2019-07-12 LAB — TSH: TSH: 3.25 mIU/L (ref 0.40–4.50)

## 2019-07-12 LAB — D-DIMER, QUANTITATIVE: D-Dimer, Quant: 0.63 mcg/mL FEU — ABNORMAL HIGH (ref <0.50)

## 2019-07-17 ENCOUNTER — Other Ambulatory Visit: Payer: Self-pay

## 2019-07-17 ENCOUNTER — Ambulatory Visit (INDEPENDENT_AMBULATORY_CARE_PROVIDER_SITE_OTHER)
Admission: RE | Admit: 2019-07-17 | Discharge: 2019-07-17 | Disposition: A | Discharge status: Home / Self Care | Payer: Medicare Other | Source: Ambulatory Visit | Attending: Internal Medicine | Admitting: Internal Medicine

## 2019-07-17 DIAGNOSIS — J479 Bronchiectasis, uncomplicated: Secondary | ICD-10-CM | POA: Diagnosis not present

## 2019-07-17 DIAGNOSIS — R0600 Dyspnea, unspecified: Secondary | ICD-10-CM

## 2019-07-17 DIAGNOSIS — J449 Chronic obstructive pulmonary disease, unspecified: Secondary | ICD-10-CM | POA: Diagnosis not present

## 2019-07-17 DIAGNOSIS — R0609 Other forms of dyspnea: Secondary | ICD-10-CM

## 2019-07-18 ENCOUNTER — Inpatient Hospital Stay: Admission: RE | Admit: 2019-07-18 | Payer: Medicare Other | Source: Ambulatory Visit

## 2019-07-19 ENCOUNTER — Ambulatory Visit (INDEPENDENT_AMBULATORY_CARE_PROVIDER_SITE_OTHER): Payer: Medicare Other | Admitting: Internal Medicine

## 2019-07-19 ENCOUNTER — Encounter: Payer: Self-pay | Admitting: Internal Medicine

## 2019-07-19 DIAGNOSIS — J84112 Idiopathic pulmonary fibrosis: Secondary | ICD-10-CM

## 2019-07-19 DIAGNOSIS — J449 Chronic obstructive pulmonary disease, unspecified: Secondary | ICD-10-CM

## 2019-07-19 NOTE — Progress Notes (Signed)
Daniel Woodard, male    DOB: 1929-11-03    MRN: [id number]   Brief patient profile:  83 yowm MM quit smoking 2001 self referred 08/11/14 to pulmonary clinic with GOLD II/ copd documented 11/28/2014 @ MMRC=1 and lost to f/u on no  maint resp rx  Then early fall 2020 gradually worse sob        History of Present Illness  07/05/2019  Pulmonary/ 1st office eval/Marcayla Budge  Chief Complaint  Patient presents with  . Consult    Patient is having shortness of breath with exertion and daily activites that started 6 months ago. Patient has a productive cough with grey sputum.   Dyspnea:  500 ft  To mailbox and has to sit on patio to get his breath = MMRC2 = can't walk a nl pace on a flat grade s sob but does fine slow and flat  Cough: varies mostly daytime / rattle min mucoid to brown occ in am  Sleep:  30-40 degrees on pillows   SABA use: none  rec Breztri Take 2 puffs first thing in am and then another 2 puffs about 12 hours later.  Work on inhaler technique:   Add: levaquin x 10 days/ pred x 6 due to ? Pna vs boop  HRCT  C/w prob UIP  07/17/19    Virtual Visit via Telephone Note 07/19/2019  Re copd/ UIP  I connected with Elmer Bales on 07/19/19 at  09:30  AM EST by telephone and verified that I am speaking with the correct person using two identifiers.   I discussed the limitations, risks, security and privacy concerns of performing an evaluation and management service by telephone and the availability of in person appointments. I also discussed with the patient that there may be a patient responsible charge related to this service. The patient expressed understanding and agreed to proceed.   History of Present Illness: Dyspnea:  Walked all over [employer] s monitoring sats / "feel fine"  Cough: none now  Sleeping: able to lie flat SABA use: none now - no worse sob off breztri  02: none  Denies symptoms of collagen vasc dz/ animal or asbestos exp or prior h/o amio/ macrodantin or chemo exp     No obvious patterns in resp symptoms in terms of  day to day or daytime variability or assoc excess/ purulent sputum or mucus plugs or hemoptysis or cp or chest tightness, subjective wheeze or overt sinus or hb symptoms.    Also denies any obvious fluctuation of symptoms with weather or environmental changes or other aggravating or alleviating factors except as outlined above.   Meds reviewed/ med reconciliation completed    Past Medical History:  Diagnosis Date  . ALLERGIC RHINITIS 04/09/2007  . Atrial fibrillation ([place])   . BACTEREMIA, 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  . BENIGN PROSTATIC HYPERTROPHY 04/09/2007  . Blood transfusion without reported diagnosis    really not sure thinks 30-40 years ago  . BRADYCARDIA 05/11/2009  . Carotid artery occlusion   . COLONIC POLYPS, HX OF 04/09/2007   ADENOMATOUS POLYPS 9675,9163  . Complete heart block ([place])   . COPD 04/09/2007  . Cough 03/27/2008  . DEPRESSION 04/09/2007  . DIABETES MELLITUS, TYPE II 10/09/2007  . DIVERTICULOSIS, COLON 04/09/2007  . DIZZINESS 10/06/2008  . GLUCOSE INTOLERANCE 04/09/2007  . HYPERLIPIDEMIA 03/27/2007  . HYPERTENSION 03/27/2007  . PERIPHERAL VASCULAR DISEASE 03/27/2007  . PNEUMONIA ORGANISM NOS 11/08/2007  . SHINGLES, HX OF 04/09/2007  . SHOULDER PAIN,  BILATERAL 08/01/2007  . Ulcer    40 years ago           Observations/Objective: Sounds very upbeat, no conversational sob/ no spont cough    Assessment and Plan: See problem list for active a/p's   Follow Up Instructions: See avs for instructions unique to this ov which includes revised/ updated med list     I discussed the assessment and treatment plan with the patient. The patient was provided an opportunity to ask questions and all were answered. The patient agreed with the plan and demonstrated an understanding of the instructions.   The patient was advised to call back or seek an in-person evaluation if the symptoms worsen or if  the condition fails to improve as anticipated.  I provided 25 minutes of non-face-to-face time during this encounter.   Christinia Gully, MD

## 2019-07-19 NOTE — Assessment & Plan Note (Signed)
Noted on cxr 07/05/2019 s acute changes clinically  - rec HRCT p levaquin 500 mg daily x 10 days  - HRCT chest 07/17/19 Spectrum of findings compatible with basilar predominant fibrotic interstitial lung disease with mild traction bronchiectasis and no frank honeycombing. Findings are categorized as probable UIP  DDx for pulmonary fibrosis  includes idiopathic pulmonary fibrosis, pulmonary fibrosis associated with rheumatologic diseases (which have a relatively benign course in most cases) , adverse effect from  drugs such as chemotherapy or amiodarone exposure, nonspecific interstitial pneumonia which is typically steroid responsive, and chronic hypersensitivity pneumonitis.   In active  smokers Langerhan's Cell  Histiocyctosis (eosinophilic granuomatosis),  DIP,  and Respiratory Bronchiolitis ILD also need to be considered, but would not be considered here.  He will need to complete the w/u with collagen vasc// hsp serologies on return and in meantime:  1) monitor sats at peak ex  2) continue PPi ac because  Use of PPI is associated with improved survival time and with decreased radiologic fibrosis per King's study published in AJRCCM vol 184 p1390.  Dec 2011 and also may have other beneficial effects as per the latest review in [place] vol 193 [id number] Jun 20016.  This may not always be cause and effect, but given how universally underwhelming  (at least in terms of short term benefit)   and expensive  all the other  Drugs developed to day  have been for pf,   rec start  rx ppi / diet/ lifestyle modification and f/u with serial walking sats and lung volumes for now to put more points on the curve / establish firm baseline before considering additional measures.    >>> f/u in 3 months with pfts / labs as above, call sooner if needed   Each maintenance medication was reviewed in detail including most importantly the difference between maintenance and as needed and under what circumstances the prns are  to be used.  Please see AVS for specific  Instructions which are unique to this visit and I personally typed out  which were reviewed in detail over the phone with the patient and a copy provided MyChart

## 2019-07-19 NOTE — Assessment & Plan Note (Addendum)
Quit smoking 2001 - PFTs 11/28/14  FEV 2.00 (66%) ratio 63 and no change p saba, dlco 47 corrects to 69 for alv vol - 07/05/2019  After extensive coaching inhaler device,  effectiveness =    75% > try Breztri 2bid > no change so did not fill rx  - 07/06/19  Alpha one phenotype MM  Level 132   Not convinced breztri made a difference but could not afford it anyway  Advised:  formulary restrictions will be an ongoing challenge for the forseable future and I would be happy to pick an alternative if the pt will first  provide me a list of them -  pt  will need to return here for training for any new device that is required eg dpi vs hfa vs respimat.    In the meantime we can always provide samples so that the patient never runs out of any needed respiratory medications.  His daugher will check price of trelegy vs Breztri vs Bevespi for future reference but since no better on vs off breztri will hold off rx.

## 2019-07-19 NOTE — Patient Instructions (Addendum)
Make sure you check your oxygen saturations at least once a week at highest level of activity to be sure it stays over 90%  -  Call if trending down for earlier appt and check cost of Trelegy vs Breztri vs Bevespi   Pulse oximeter should be 30-40 dollars at [employer] Prilosec 20 mg Take 30-60 min before first meal of the day automatically (not as needed ) as may help your breathing in the long run.   Please schedule a follow up visit in 3 months but call sooner if needed  with all medications /inhalers/ solutions in hand so we can verify exactly what you are taking. This includes all medications from all doctors and over the counters Add: needs walking sats, cvd/hsp serologies/ PFT's on return.

## 2019-07-22 ENCOUNTER — Encounter: Payer: Medicare Other | Admitting: Internal Medicine

## 2019-07-23 ENCOUNTER — Ambulatory Visit (INDEPENDENT_AMBULATORY_CARE_PROVIDER_SITE_OTHER): Payer: Medicare Other | Admitting: Internal Medicine

## 2019-07-23 ENCOUNTER — Other Ambulatory Visit: Payer: Self-pay

## 2019-07-23 ENCOUNTER — Encounter: Payer: Self-pay | Admitting: Internal Medicine

## 2019-07-23 VITALS — BP 116/82 | HR 96 | Ht 73.0 in | Wt 194.0 lb

## 2019-07-23 DIAGNOSIS — I441 Atrioventricular block, second degree: Secondary | ICD-10-CM

## 2019-07-23 DIAGNOSIS — I10 Essential (primary) hypertension: Secondary | ICD-10-CM | POA: Diagnosis not present

## 2019-07-23 DIAGNOSIS — Z950 Presence of cardiac pacemaker: Secondary | ICD-10-CM

## 2019-07-23 MED ORDER — FUROSEMIDE 20 MG PO TABS: ORAL_TABLET | ORAL | 3 refills | Status: DC

## 2019-07-23 NOTE — Patient Instructions (Addendum)
Medication Instructions:  Your physician recommends that you continue on your current medications as directed. Please refer to the Current Medication list given to you today.  Lasix (furosemide) 20 mg-  Take one tablet by mouth daily as needed for swelling  Labwork: None ordered.  Testing/Procedures: None ordered.  Follow-Up: Your physician wants you to follow-up in: one year with Dr. Lovena Le.   You will receive a reminder letter in the mail two months in advance. If you don't receive a letter, please call our office to schedule the follow-up appointment.  Remote monitoring is used to monitor your Pacemaker from home. This monitoring reduces the number of office visits required to check your device to one time per year. It allows [place] to keep an eye on the functioning of your device to ensure it is working properly. You are scheduled for a device check from home on 10/10/2019. You may send your transmission at any time that day. If you have a wireless device, the transmission will be sent automatically. After your physician reviews your transmission, you will receive a postcard with your next transmission date.  Any Other Special Instructions Will Be Listed Below (If Applicable).  If you need a refill on your cardiac medications before your next appointment, please call your pharmacy.

## 2019-07-23 NOTE — Progress Notes (Signed)
HPI Daniel Woodard returns today for followup. He is a pleasant 86 yo man with chb, s/p PPM insertion, HTN, carotid vascular disease, who has done well but notes occaisional episodes of dyspnea. The patient tries to avoid sodium excess. He denies syncope or peripheral edema or chest pain. Allergies  Allergen Reactions  . Ace Inhibitors Anaphylaxis    REACTION: cough REACTION: cough REACTION: cough REACTION: cough     Current Outpatient Medications  Medication Sig Dispense Refill  . aspirin 325 MG tablet Take 1 tablet (325 mg total) by mouth daily.    . Coenzyme Q10 (COQ-10) 200 MG CAPS Take 200 mg by mouth daily.     . metoprolol succinate (TOPROL-XL) 25 MG 24 hr tablet Take 0.5 tablets (12.5 mg total) by mouth daily. 45 tablet 3  . omeprazole (PRILOSEC) 20 MG capsule Take 1 capsule by mouth once daily 90 capsule 1  . psyllium (METAMUCIL) 58.6 % powder Take 2 caps by mouth every day    . rosuvastatin (CRESTOR) 20 MG tablet Take 1 tablet (20 mg total) by mouth at bedtime. 90 tablet 0  . furosemide (LASIX) 20 MG tablet One tablet by mouth daily as needed for swelling 90 tablet 3   No current facility-administered medications for this visit.     Past Medical History:  Diagnosis Date  . ALLERGIC RHINITIS 04/09/2007  . Atrial fibrillation ([place])   . BACTEREMIA, 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  . BENIGN PROSTATIC HYPERTROPHY 04/09/2007  . Blood transfusion without reported diagnosis    really not sure thinks 30-40 years ago  . BRADYCARDIA 05/11/2009  . Carotid artery occlusion   . COLONIC POLYPS, HX OF 04/09/2007   ADENOMATOUS POLYPS 8127,5170  . Complete heart block ([place])   . COPD 04/09/2007  . Cough 03/27/2008  . DEPRESSION 04/09/2007  . DIABETES MELLITUS, TYPE II 10/09/2007  . DIVERTICULOSIS, COLON 04/09/2007  . DIZZINESS 10/06/2008  . GLUCOSE INTOLERANCE 04/09/2007  . HYPERLIPIDEMIA 03/27/2007  . HYPERTENSION 03/27/2007  . PERIPHERAL VASCULAR DISEASE  03/27/2007  . PNEUMONIA ORGANISM NOS 11/08/2007  . SHINGLES, HX OF 04/09/2007  . SHOULDER PAIN, BILATERAL 08/01/2007  . Ulcer    40 years ago    ROS:   All systems reviewed and negative except as noted in the HPI.   Past Surgical History:  Procedure Laterality Date  . APPENDECTOMY     86 years old  . CAROTID ENDARTERECTOMY  5/08   left  . CARPAL TUNNEL RELEASE     bilateral  . COLONOSCOPY    . ESOPHAGOGASTRODUODENOSCOPY  06/07/2004  . HEMORRHOID BANDING  1990s  . PACEMAKER INSERTION  07/11/2013   MDT ADDRL1 pacemaker implanted by Dr Lovena Le for complete heart block  . PERMANENT PACEMAKER INSERTION N/A 07/11/2013   Procedure: PERMANENT PACEMAKER INSERTION;  Surgeon: Evans Lance, MD;  Location: [facility] C[facility] LAB;  Service: Cardiovascular;  Laterality: N/A;  . s/p PUD surgury     ? partial gastrectomy     Family History  Problem Relation Age of Onset  . Hypertension Father   . Ulcers Father   . Hypertension Mother   . Hypertension Sister   . Peripheral vascular disease Brother   . Hypertension Sister   . Ulcers Sister        Ulcers  . Hypertension Sister   . Lung cancer Sister        smoked  . Heart disease Sister        Carotid   . Hypertension  Sister   . Heart disease Other        Cardiovascular disorder, CHF  . Stroke Other        1st degree relative male and male  . Colon cancer Neg Hx      Social History   Socioeconomic History  . Marital status: Married    Spouse name: Not on file  . Number of children: 2  . Years of education: Not on file  . Highest education level: Not on file  Occupational History  . 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 Smoking status: Former Smoker    Packs/day: 1.00    Years: 50.00    Pack years: 50.00    Types: Cigarettes    Quit date: 08/20/1999    Years since quitting: 19.9  . Smokeless tobacco: Never Used  Substance and Sexual Activity  . Alcohol use: No     Alcohol/week: 0.0 standard drinks  . Drug use: No  . Sexual activity: Not on file  Other Topics Concern  . Not on file  Social History Narrative   Daily caffeine    Social Determinants of Health   Financial Resource Strain:   . Difficulty of Paying Living Expenses: Not on file  Food Insecurity:   . Worried About Charity fundraiser in the Last Year: Not on file  . Ran Out of Food in the Last Year: Not on file  Transportation Needs:   . Lack of Transportation (Medical): Not on file  . Lack of Transportation (Non-Medical): Not on file  Physical Activity:   . Days of Exercise per Week: Not on file  . Minutes of Exercise per Session: Not on file  Stress:   . Feeling of Stress : Not on file  Social Connections:   . Frequency of Communication with Friends and Family: Not on file  . Frequency of Social Gatherings with Friends and Family: Not on file  . Attends Religious Services: Not on file  . Active Member of Clubs or Organizations: Not on file  . Attends Archivist Meetings: Not on file  . Marital Status: Not on file  Intimate Partner Violence:   . Fear of Current or Ex-Partner: Not on file  . Emotionally Abused: Not on file  . Physically Abused: Not on file  . Sexually Abused: Not on file     BP 116/82   Pulse 96   Ht _0  (1.854 m)   Wt 194 lb (88 kg)   SpO2 95%   BMI 25.60 kg/m   Physical Exam:  Well appearing NAD HEENT: Unremarkable Neck:  6 cm JVD, no thyromegally Lymphatics:  No adenopathy Back:  No CVA tenderness Lungs:  Clear with no wheezes HEART:  Regular rate rhythm, no murmurs, no rubs, no clicks Abd:  soft, positive bowel sounds, no organomegally, no rebound, no guarding Ext:  2 plus pulses, no edema, no cyanosis, no clubbing Skin:  No rashes no nodules Neuro:  CN II through XII intact, motor grossly intact   DEVICE  Normal device function.  See PaceArt for details.   Assess/Plan: 1. CHB - he is asymptomatic, s/p PPM insertion.  2.  Dyspnea - strongly suspect diastolic heart failure and I have asked him take as needed lasix. He is encouraged to maintain a low sodium diet. 3. PPM - his medtronic DDD PM is working normally.  4. HTN - his bp is well controlled. He will continue  his current meds.  Mikle Bosworth.D.

## 2019-08-16 ENCOUNTER — Other Ambulatory Visit: Payer: Self-pay

## 2019-08-16 ENCOUNTER — Encounter: Payer: Self-pay | Admitting: Internal Medicine

## 2019-08-16 ENCOUNTER — Ambulatory Visit: Payer: Medicare Other | Admitting: Internal Medicine

## 2019-08-16 DIAGNOSIS — J449 Chronic obstructive pulmonary disease, unspecified: Secondary | ICD-10-CM | POA: Diagnosis not present

## 2019-08-16 DIAGNOSIS — J84112 Idiopathic pulmonary fibrosis: Secondary | ICD-10-CM | POA: Diagnosis not present

## 2019-08-16 NOTE — Progress Notes (Signed)
Daniel Woodard, male    DOB: 15-Dec-1929    MRN: [id number]   Brief patient profile:  3 yowm MM/quit smoking 2001 self referred 08/11/14 to pulmonary clinic with GOLD II/ copd documented 11/28/2014 @ MMRC=1 and lost to f/u on no  maint resp rx  Then early fall 2020 gradually worse sob        History of Present Illness  07/05/2019  Pulmonary/ 1st office eval/Daniel Woodard  Chief Complaint  Patient presents with  . Consult    Patient is having shortness of breath with exertion and daily activites that started 6 months ago. Patient has a productive cough with grey sputum.   Dyspnea:  500 ft  To mailbox and has to sit on patio to get his breath = MMRC2 = can't walk a nl pace on a flat grade s sob but does fine slow and flat  Cough: varies mostly daytime / rattle min mucoid to brown occ in am  Sleep:  30-40 degrees on pillows   SABA use: none  rec Breztri Take 2 puffs first thing in am and then another 2 puffs about 12 hours later.  Work on inhaler technique:    Please remember to go to the lab and x-ray department   for your tests - we will call you with the results when they are available.   08/16/2019  f/u ov/Daniel Woodard re: GOLD II/ doe no change on breztri but never rechallenged  Chief Complaint  Patient presents with  . Follow-up    Breathing is doing well and no new co's today.   Dyspnea:  Breathing well but more  inactive  Cough: rattles a bit but no mucus Sleeping: able to lie flat now  SABA use: none 02: none    No obvious day to day or daytime variability or assoc excess/ purulent sputum or mucus plugs or hemoptysis or cp or chest tightness, subjective wheeze or overt sinus or hb symptoms.   Sleeping  without nocturnal  or early am exacerbation  of respiratory  c/o's or need for noct saba. Also denies any obvious fluctuation of symptoms with weather or environmental changes or other aggravating or alleviating factors except as outlined above   No unusual exposure hx or h/o childhood pna/  asthma or knowledge of premature birth.  Current Allergies, Complete Past Medical History, Past Surgical History, Family History, and Social History were reviewed in [employer] record.  ROS  The following are not active complaints unless bolded Hoarseness, sore throat, dysphagia, dental problems, itching, sneezing,  nasal congestion or discharge of excess mucus or purulent secretions, ear ache,   fever, chills, sweats, unintended wt loss or wt gain, classically pleuritic or exertional cp,  orthopnea pnd or arm/hand swelling  or leg swelling, presyncope, palpitations, abdominal pain, anorexia, nausea, vomiting, diarrhea  or change in bowel habits or change in bladder habits, change in stools or change in urine, dysuria, hematuria,  rash, arthralgias, visual complaints, headache, numbness, weakness or ataxia or problems with walking or coordination,  change in mood or  memory.        Current Meds  Medication Sig  . aspirin 325 MG tablet Take 1 tablet (325 mg total) by mouth daily.  . Coenzyme Q10 (COQ-10) 200 MG CAPS Take 200 mg by mouth daily.   . furosemide (LASIX) 20 MG tablet One tablet by mouth daily as needed for swelling  . metoprolol succinate (TOPROL-XL) 25 MG 24 hr tablet Take 0.5 tablets (12.5 mg  total) by mouth daily.  Marland Kitchen omeprazole (PRILOSEC) 20 MG capsule Take 1 capsule by mouth once daily  . psyllium (METAMUCIL) 58.6 % powder Take 2 caps by mouth every day  . rosuvastatin (CRESTOR) 20 MG tablet Take 1 tablet (20 mg total) by mouth at bedtime.  Marland Kitchen VITAMIN D PO Take 1 tablet by mouth daily.           Past Medical History:  Diagnosis Date  . ALLERGIC RHINITIS 04/09/2007  . Atrial fibrillation ([place])   . BACTEREMIA, 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  . BENIGN PROSTATIC HYPERTROPHY 04/09/2007  . Blood transfusion without reported diagnosis    really not sure thinks 30-40 years ago  . BRADYCARDIA 05/11/2009  . Carotid artery occlusion   . COLONIC  POLYPS, HX OF 04/09/2007   ADENOMATOUS POLYPS 7092,9574  . Complete heart block ([place])   . COPD 04/09/2007  . Cough 03/27/2008  . DEPRESSION 04/09/2007  . DIABETES MELLITUS, TYPE II 10/09/2007  . DIVERTICULOSIS, COLON 04/09/2007  . DIZZINESS 10/06/2008  . GLUCOSE INTOLERANCE 04/09/2007  . HYPERLIPIDEMIA 03/27/2007  . HYPERTENSION 03/27/2007  . PERIPHERAL VASCULAR DISEASE 03/27/2007  . PNEUMONIA ORGANISM NOS 11/08/2007  . SHINGLES, HX OF 04/09/2007  . SHOULDER PAIN, BILATERAL 08/01/2007  . Ulcer    40 years ago       Objective:     Wt Readings from Last 3 Encounters:  08/16/19 195 lb (88.5 kg)  07/23/19 194 lb (88 kg)  07/05/19 194 lb (88 kg)     Vital signs reviewed - Note on arrival 02 sats  95% on RA     Full dentures   HEENT : pt wearing mask not removed for exam due to covid - 19 concerns.    NECK :  without JVD/Nodes/TM/ nl carotid upstrokes bilaterally   LUNGS: no acc muscle use,  Mild barrel  contour chest wall with bilateral  Distant exp rhonchi  s true wheeze  and  without cough on insp or exp maneuvers  and mild  Hyperresonant  to  percussion bilaterally     CV:  RRR  no s3 or murmur or increase in P2, and no edema   ABD:  soft and nontender with pos end  insp Hoover's  in the supine position. No bruits or organomegaly appreciated, bowel sounds nl  MS:   Nl gait/  ext warm without deformities, calf tenderness, cyanosis or clubbing No obvious joint restrictions   SKIN: warm and dry without lesions    NEURO:  alert, approp, nl sensorium with  no motor or cerebellar deficits apparent.           I personally reviewed images and agree with radiology impression as follows:   Chest HRCT 07/18/19 1. Spectrum of findings compatible with basilar predominant fibrotic interstitial lung disease with mild traction bronchiectasis and no frank honeycombing. Findings are categorized as probable UIP per consensus guidelines: Diagnosis of Idiopathic Pulmonary Fibrosis:  An Official ATS/ERS/JRS/ALAT Clinical Practice Guideline. [place], Iss 5, [phone], Jan 28 2017. 2. Two solid pulmonary nodules, largest 6 mm in the left lower lobe.      Assessment

## 2019-08-16 NOTE — Patient Instructions (Signed)
No changes in medications  Make sure you check your oxygen saturations at highest level of activity to be sure it stays over 90% and call if trending down   Please schedule a follow up office visit in 6 weeks, call sooner if needed or first available PFTs

## 2019-08-17 ENCOUNTER — Encounter: Payer: Self-pay | Admitting: Internal Medicine

## 2019-08-17 NOTE — Assessment & Plan Note (Signed)
Noted on cxr 07/05/2019 s acute changes clinically  - rec HRCT p levaquin 500 mg daily x 10 days  - HRCT chest 07/17/19 Spectrum of findings compatible with basilar predominant fibroticinterstitial lung disease with mild traction bronchiectasis and no frank honeycombing. Findings are categorized as probable UIP - 08/16/2019   Walked RA x 3 laps =  approx 724ft @ nl pace - stopped due to end of study  with sats of 93 % at the end of the study s sob   At age 86 I doubt antifibrotics would have an impact on his M and M but needs to be monitored and offered rx if decline noted  rec ov with pfts when available/ monitor sats with activity.          Each maintenance medication was reviewed in detail including emphasizing most importantly the difference between maintenance and prns and under what circumstances the prns are to be triggered using an action plan format where appropriate.  Total time for H and P, chart review, counseling with daughter,  directly observing portions of ambulatory 02 saturation study/  and generating customized AVS unique to this office visit / charting = 30 min

## 2019-08-17 NOTE — Assessment & Plan Note (Signed)
Quit smoking 2001 - PFTs 11/28/14  FEV 2.00 (66%) ratio 63 and no change p saba, dlco 47 corrects to 69 for alv vol - 07/05/2019  After extensive coaching inhaler device,  effectiveness =    75% > try Breztri 2bid > no change so did not fill rx  - 07/06/19  Alpha one phenotype MM  Level 132   As I explained to this patient in detail:  although there is copd present, it may not be clinically relevant:   it does not appear to be limiting activity tolerance any more than a set of worn tires limits someone from driving a car  around a parking lot.  A new set of Michelins might look good but would have no perceived impact on the performance of the car and would not be worth the cost.  That is to say:   this pt is so sedentary now I don't recommend aggressive pulmonary rx at this point unless limiting symptoms arise or acute exacerbations become as issue, neither of which is the case now.  I asked the patient to contact this office at any time in the future should either of these problems arise.

## 2019-09-09 ENCOUNTER — Encounter: Payer: Self-pay | Admitting: Internal Medicine

## 2019-09-27 ENCOUNTER — Ambulatory Visit: Payer: Medicare Other | Admitting: Internal Medicine

## 2019-09-27 ENCOUNTER — Other Ambulatory Visit: Payer: Self-pay

## 2019-09-27 ENCOUNTER — Encounter: Payer: Self-pay | Admitting: Internal Medicine

## 2019-09-27 VITALS — BP 116/76 | HR 82 | Temp 98.0°F | Ht 73.0 in | Wt 197.6 lb

## 2019-09-27 DIAGNOSIS — J449 Chronic obstructive pulmonary disease, unspecified: Secondary | ICD-10-CM

## 2019-09-27 DIAGNOSIS — J84112 Idiopathic pulmonary fibrosis: Secondary | ICD-10-CM | POA: Diagnosis not present

## 2019-09-27 NOTE — Patient Instructions (Signed)
Make sure you check your oxygen saturations at highest level of activity to be sure it stays over 90%  - call if trending down   Please schedule a follow up visit in 3 months but call sooner if needed  PFT's on return

## 2019-09-27 NOTE — Progress Notes (Signed)
Daniel Woodard, male    DOB: 08-10-1929    MRN: [id number]   Brief patient profile:  34 yowm MM/quit smoking 2001 self referred 08/11/14 to pulmonary clinic with GOLD II/ copd documented 11/28/2014 @ MMRC=1 and lost to f/u on no  maint resp rx  Then early fall 2020 gradually worse sob        History of Present Illness  07/05/2019  Pulmonary/ 1st office eval/Daniel Woodard  Chief Complaint  Patient presents with  . Consult    Patient is having shortness of breath with exertion and daily activites that started 6 months ago. Patient has a productive cough with grey sputum.   Dyspnea:  500 ft  To mailbox and has to sit on patio to get his breath = MMRC2 = can't walk a nl pace on a flat grade s sob but does fine slow and flat  Cough: varies mostly daytime / rattle min mucoid to brown occ in am  Sleep:  30-40 degrees on pillows   SABA use: none  rec Breztri Take 2 puffs first thing in am and then another 2 puffs about 12 hours later.  Work on inhaler technique:    Please remember to go to the lab and x-ray department   for your tests - we will call you with the results when they are available.   08/16/2019  f/u ov/Daniel Woodard re: GOLD II/ doe no change on breztri but never rechallenged  Chief Complaint  Patient presents with  . Follow-up    Breathing is doing well and no new co's today.   Dyspnea:  Breathing well but more  inactive  Cough: rattles a bit but no mucus Sleeping: able to lie flat now  SABA use: none 02: none  rec No changes in medications Make sure you check your oxygen saturations at highest level of activity to be sure it stays over 90% and call if trending dow    09/27/2019  f/u ov/Daniel Woodard re:   GOLD II  / pf with bronchiectasis / s/p both vaccinations pfizer  Chief Complaint  Patient presents with  . Follow-up  Dyspnea:  Does sams, sats usually > 90% RA  Cough: rattle some in am but no mucus  Sleeping: x one year poor wakes up p only a few hours s resp symptoms, no caffeine or  napping  SABA use: none  02: no   No obvious day to day or daytime variability or assoc excess/ purulent sputum or mucus plugs or hemoptysis or cp or chest tightness, subjective wheeze or overt sinus or hb symptoms.   Sleeping  without nocturnal  or early am exacerbation  of respiratory  c/o's or need for noct saba. Also denies any obvious fluctuation of symptoms with weather or environmental changes or other aggravating or alleviating factors except as outlined above   No unusual exposure hx or h/o childhood pna/ asthma or knowledge of premature birth.  Current Allergies, Complete Past Medical History, Past Surgical History, Family History, and Social History were reviewed in [employer] record.  ROS  The following are not active complaints unless bolded Hoarseness, sore throat, dysphagia, dental problems, itching, sneezing,  nasal congestion or discharge of excess mucus or purulent secretions, ear ache,   fever, chills, sweats, unintended wt loss or wt gain, classically pleuritic or exertional cp,  orthopnea pnd or arm/hand swelling  or leg swelling, presyncope, palpitations, abdominal pain, anorexia, nausea, vomiting, diarrhea  or change in bowel habits or change in  bladder habits, change in stools or change in urine, dysuria, hematuria,  rash, arthralgias, visual complaints, headache, numbness, weakness or ataxia or problems with walking or coordination,  change in mood or  memory.        Current Meds  Medication Sig  . aspirin 325 MG tablet Take 1 tablet (325 mg total) by mouth daily.  . Coenzyme Q10 (COQ-10) 200 MG CAPS Take 200 mg by mouth daily.   . furosemide (LASIX) 20 MG tablet One tablet by mouth daily as needed for swelling  . metoprolol succinate (TOPROL-XL) 25 MG 24 hr tablet Take 0.5 tablets (12.5 mg total) by mouth daily.  Marland Kitchen omeprazole (PRILOSEC) 20 MG capsule Take 1 capsule by mouth once daily  . psyllium (METAMUCIL) 58.6 % powder Take 2 caps by mouth  every day  . rosuvastatin (CRESTOR) 20 MG tablet Take 1 tablet (20 mg total) by mouth at bedtime.  Marland Kitchen VITAMIN D PO Take 1 tablet by mouth daily.            Past Medical History:  Diagnosis Date  . ALLERGIC RHINITIS 04/09/2007  . Atrial fibrillation ([place])   . BACTEREMIA, 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  . BENIGN PROSTATIC HYPERTROPHY 04/09/2007  . Blood transfusion without reported diagnosis    really not sure thinks 30-40 years ago  . BRADYCARDIA 05/11/2009  . Carotid artery occlusion   . COLONIC POLYPS, HX OF 04/09/2007   ADENOMATOUS POLYPS 4076,8088  . Complete heart block ([place])   . COPD 04/09/2007  . Cough 03/27/2008  . DEPRESSION 04/09/2007  . DIABETES MELLITUS, TYPE II 10/09/2007  . DIVERTICULOSIS, COLON 04/09/2007  . DIZZINESS 10/06/2008  . GLUCOSE INTOLERANCE 04/09/2007  . HYPERLIPIDEMIA 03/27/2007  . HYPERTENSION 03/27/2007  . PERIPHERAL VASCULAR DISEASE 03/27/2007  . PNEUMONIA ORGANISM NOS 11/08/2007  . SHINGLES, HX OF 04/09/2007  . SHOULDER PAIN, BILATERAL 08/01/2007  . Ulcer    40 years ago       Objective:      09/27/2019      197   08/16/19 195 lb (88.5 kg)  07/23/19 194 lb (88 kg)  07/05/19 194 lb (88 kg)    amb wm nad  Vital signs reviewed  09/27/2019  - Note at rest 02 sats  93% on RA     Full dentures  HEENT : pt wearing mask not removed for exam due to covid - 19 concerns.    NECK :  without JVD/Nodes/TM/ nl carotid upstrokes bilaterally   LUNGS: no acc muscle use,  Mild barrel  contour chest wall with bilateral  Distant bs s audible wheeze and  without cough on insp or exp maneuvers  and mild  Hyperresonant  to  percussion bilaterally     CV:  RRR  no s3 or murmur or increase in P2, and no edema   ABD:  soft and nontender with pos end  insp Hoover's  in the supine position. No bruits or organomegaly appreciated, bowel sounds nl  MS:   Nl gait/  ext warm without deformities, calf tenderness, cyanosis or clubbing No obvious joint  restrictions   SKIN: warm and dry without lesions    NEURO:  alert, approp, nl sensorium with  no motor or cerebellar deficits apparent.             Assessment

## 2019-09-28 ENCOUNTER — Encounter: Payer: Self-pay | Admitting: Internal Medicine

## 2019-09-28 NOTE — Assessment & Plan Note (Addendum)
Noted on cxr 07/05/2019 s acute changes clinically  - rec HRCT p levaquin 500 mg daily x 10 days  - HRCT chest 07/17/19 Spectrum of findings compatible with basilar predominant fibroticinterstitial lung disease with mild traction bronchiectasis and no frank honeycombing. Findings are categorized as probable UIP - 08/16/2019   Walked RA x 3 laps =  approx 767ft @ nl pace - stopped due to end of study  with sats of 93 % at the end of the study s sob   No evidence of progression Advised: Make sure you check your oxygen saturations at highest level of activity to be sure it stays over 90% and call if trending down  >>> full pfts on return in 3 m   Each maintenance medication was reviewed in detail including emphasizing most importantly the difference between maintenance and prns and under what circumstances the prns are to be triggered using an action plan format where appropriate.    Total time for H and P, chart review, counseling,  and generating customized AVS unique to this office visit / charting = 20 min

## 2019-09-28 NOTE — Assessment & Plan Note (Signed)
Quit smoking 2001 - PFTs 11/28/14  FEV 2.00 (66%) ratio 63 and no change p saba, dlco 47 corrects to 69 for alv vol - 07/05/2019  After extensive coaching inhaler device,  effectiveness =    75% > try Breztri 2bid > no change so did not fill rx  - 07/06/19  Alpha one phenotype MM  Level 132   Not limited by sob from doing desired acitivities/ no tendency to aecopd >> no need for inhalers.

## 2019-10-10 ENCOUNTER — Ambulatory Visit (INDEPENDENT_AMBULATORY_CARE_PROVIDER_SITE_OTHER): Payer: Medicare Other | Admitting: *Deleted

## 2019-10-10 DIAGNOSIS — I441 Atrioventricular block, second degree: Secondary | ICD-10-CM

## 2019-10-10 LAB — CUP PACEART REMOTE DEVICE CHECK
Battery Impedance: 647 Ohm
Battery Remaining Longevity: 73 mo
Battery Voltage: 2.77 V
Brady Statistic AP VP Percent: 19 %
Brady Statistic AP VS Percent: 0 %
Brady Statistic AS VP Percent: 81 %
Brady Statistic AS VS Percent: 0 %
Date Time Interrogation Session: 20210513074515
Implantable Lead Implant Date: 20150212
Implantable Lead Implant Date: 20150212
Implantable Lead Location: 753859
Implantable Lead Location: 753860
Implantable Lead Model: 5076
Implantable Lead Model: 5076
Implantable Pulse Generator Implant Date: 20150212
Lead Channel Impedance Value: 438 Ohm
Lead Channel Impedance Value: 500 Ohm
Lead Channel Pacing Threshold Amplitude: 0.625 V
Lead Channel Pacing Threshold Amplitude: 0.875 V
Lead Channel Pacing Threshold Pulse Width: 0.4 ms
Lead Channel Pacing Threshold Pulse Width: 0.4 ms
Lead Channel Setting Pacing Amplitude: 2 V
Lead Channel Setting Pacing Amplitude: 2.5 V
Lead Channel Setting Pacing Pulse Width: 0.4 ms
Lead Channel Setting Sensing Sensitivity: 4 mV

## 2019-10-14 NOTE — Progress Notes (Signed)
Remote pacemaker transmission.   

## 2019-10-16 ENCOUNTER — Other Ambulatory Visit: Payer: Self-pay | Admitting: Internal Medicine

## 2019-10-16 NOTE — Telephone Encounter (Signed)
Please refill as per office routine med refill policy (all routine meds refilled for 3 mo or monthly per pt preference up to one year from last visit, then month to month grace period for 3 mo, then further med refills will have to be denied)  

## 2019-10-31 DIAGNOSIS — D485 Neoplasm of uncertain behavior of skin: Secondary | ICD-10-CM | POA: Diagnosis not present

## 2019-10-31 DIAGNOSIS — L7211 Pilar cyst: Secondary | ICD-10-CM | POA: Diagnosis not present

## 2019-10-31 DIAGNOSIS — L570 Actinic keratosis: Secondary | ICD-10-CM | POA: Diagnosis not present

## 2019-10-31 DIAGNOSIS — L821 Other seborrheic keratosis: Secondary | ICD-10-CM | POA: Diagnosis not present

## 2019-11-15 ENCOUNTER — Other Ambulatory Visit: Payer: Self-pay | Admitting: Internal Medicine

## 2019-11-15 NOTE — Telephone Encounter (Signed)
Please refill as per office routine med refill policy (all routine meds refilled for 3 mo or monthly per pt preference up to one year from last visit, then month to month grace period for 3 mo, then further med refills will have to be denied)  

## 2019-11-18 ENCOUNTER — Ambulatory Visit (INDEPENDENT_AMBULATORY_CARE_PROVIDER_SITE_OTHER): Payer: Medicare Other

## 2019-11-18 DIAGNOSIS — Z0000 Encounter for general adult medical examination without abnormal findings: Secondary | ICD-10-CM | POA: Diagnosis not present

## 2019-11-18 NOTE — Patient Instructions (Signed)
Daniel Woodard , Thank you for taking time to come for your Medicare Wellness Visit. I appreciate your ongoing commitment to your health goals. Please review the following plan we discussed and let me know if I can assist you in the future.   Screening recommendations/referrals: Colonoscopy: last done 05/17/2012; no repeat due to age Recommended yearly ophthalmology/optometry visit for glaucoma screening and checkup Recommended yearly dental visit for hygiene and checkup  Vaccinations: Influenza vaccine: 02/27/2019 Pneumococcal vaccine: completed Tdap vaccine: 11/22/2018; due very 10 years; overdue Shingles vaccine: Zoster 04/07/2008   Covid-19: completed  Advanced directives: Advance directive discussed with you today. We have copy on file.  Conditions/risks identified: Yes; Please continue to do your personal lifestyle choices by: daily care of teeth and gums, regular physical activity (goal should be 5 days a week for 30 minutes), eat a healthy diet, avoid tobacco and drug use, limiting any alcohol intake, taking a low-dose aspirin (if not allergic or have been advised by your provider otherwise) and taking vitamins and minerals as recommended by your provider.  Next appointment: Please schedule your next Medicare Wellness Visit with your Nurse Health Advisor in 1 year.  Preventive Care 84 Years and Older, Male Preventive care refers to lifestyle choices and visits with your health care provider that can promote health and wellness. What does preventive care include?  A yearly physical exam. This is also called an annual well check.  Dental exams once or twice a year.  Routine eye exams. Ask your health care provider how often you should have your eyes checked.  Personal lifestyle choices, including:  Daily care of your teeth and gums.  Regular physical activity.  Eating a healthy diet.  Avoiding tobacco and drug use.  Limiting alcohol use.  Practicing safe sex.  Taking low  doses of aspirin every day.  Taking vitamin and mineral supplements as recommended by your health care provider. What happens during an annual well check? The services and screenings done by your health care provider during your annual well check will depend on your age, overall health, lifestyle risk factors, and family history of disease. Counseling  Your health care provider may ask you questions about your:  Alcohol use.  Tobacco use.  Drug use.  Emotional well-being.  Home and relationship well-being.  Sexual activity.  Eating habits.  History of falls.  Memory and ability to understand (cognition).  Work and work Statistician. Screening  You may have the following tests or measurements:  Height, weight, and BMI.  Blood pressure.  Lipid and cholesterol levels. These may be checked every 5 years, or more frequently if you are over 84 years old.  Skin check.  Lung cancer screening. You may have this screening every year starting at age 84 if you have a 30-pack-year history of smoking and currently smoke or have quit within the past 15 years.  Fecal occult blood test (FOBT) of the stool. You may have this test every year starting at age 84.  Flexible sigmoidoscopy or colonoscopy. You may have a sigmoidoscopy every 5 years or a colonoscopy every 10 years starting at age 84.  Prostate cancer screening. Recommendations will vary depending on your family history and other risks.  Hepatitis C blood test.  Hepatitis B blood test.  Sexually transmitted disease (STD) testing.  Diabetes screening. This is done by checking your blood sugar (glucose) after you have not eaten for a while (fasting). You may have this done every 1-3 years.  Abdominal aortic aneurysm (AAA)  screening. You may need this if you are a current or former smoker.  Osteoporosis. You may be screened starting at age 84 if you are at high risk. Talk with your health care provider about your test  results, treatment options, and if necessary, the need for more tests. Vaccines  Your health care provider may recommend certain vaccines, such as:  Influenza vaccine. This is recommended every year.  Tetanus, diphtheria, and acellular pertussis (Tdap, Td) vaccine. You may need a Td booster every 10 years.  Zoster vaccine. You may need this after age 84.  Pneumococcal 13-valent conjugate (PCV13) vaccine. One dose is recommended after age 84.  Pneumococcal polysaccharide (PPSV23) vaccine. One dose is recommended after age 84. Talk to your health care provider about which screenings and vaccines you need and how often you need them. This information is not intended to replace advice given to you by your health care provider. Make sure you discuss any questions you have with your health care provider. Document Released: 06/12/2015 Document Revised: 02/03/2016 Document Reviewed: 03/17/2015 Elsevier Interactive Patient Education  2017 [place] Prevention in the Home Falls can cause injuries. They can happen to people of all ages. There are many things you can do to make your home safe and to help prevent falls. What can I do on the outside of my home?  Regularly fix the edges of walkways and driveways and fix any cracks.  Remove anything that might make you trip as you walk through a door, such as a raised step or threshold.  Trim any bushes or trees on the path to your home.  Use bright outdoor lighting.  Clear any walking paths of anything that might make someone trip, such as rocks or tools.  Regularly check to see if handrails are loose or broken. Make sure that both sides of any steps have handrails.  Any raised decks and porches should have guardrails on the edges.  Have any leaves, snow, or ice cleared regularly.  Use sand or salt on walking paths during winter.  Clean up any spills in your garage right away. This includes oil or grease spills. What can I do in  the bathroom?  Use night lights.  Install grab bars by the toilet and in the tub and shower. Do not use towel bars as grab bars.  Use non-skid mats or decals in the tub or shower.  If you need to sit down in the shower, use a plastic, non-slip stool.  Keep the floor dry. Clean up any water that spills on the floor as soon as it happens.  Remove soap buildup in the tub or shower regularly.  Attach bath mats securely with double-sided non-slip rug tape.  Do not have throw rugs and other things on the floor that can make you trip. What can I do in the bedroom?  Use night lights.  Make sure that you have a light by your bed that is easy to reach.  Do not use any sheets or blankets that are too big for your bed. They should not hang down onto the floor.  Have a firm chair that has side arms. You can use this for support while you get dressed.  Do not have throw rugs and other things on the floor that can make you trip. What can I do in the kitchen?  Clean up any spills right away.  Avoid walking on wet floors.  Keep items that you use a lot in easy-to-reach  places.  If you need to reach something above you, use a strong step stool that has a grab bar.  Keep electrical cords out of the way.  Do not use floor polish or wax that makes floors slippery. If you must use wax, use non-skid floor wax.  Do not have throw rugs and other things on the floor that can make you trip. What can I do with my stairs?  Do not leave any items on the stairs.  Make sure that there are handrails on both sides of the stairs and use them. Fix handrails that are broken or loose. Make sure that handrails are as long as the stairways.  Check any carpeting to make sure that it is firmly attached to the stairs. Fix any carpet that is loose or worn.  Avoid having throw rugs at the top or bottom of the stairs. If you do have throw rugs, attach them to the floor with carpet tape.  Make sure that you  have a light switch at the top of the stairs and the bottom of the stairs. If you do not have them, ask someone to add them for you. What else can I do to help prevent falls?  Wear shoes that:  Do not have high heels.  Have rubber bottoms.  Are comfortable and fit you well.  Are closed at the toe. Do not wear sandals.  If you use a stepladder:  Make sure that it is fully opened. Do not climb a closed stepladder.  Make sure that both sides of the stepladder are locked into place.  Ask someone to hold it for you, if possible.  Clearly mark and make sure that you can see:  Any grab bars or handrails.  First and last steps.  Where the edge of each step is.  Use tools that help you move around (mobility aids) if they are needed. These include:  Canes.  Walkers.  Scooters.  Crutches.  Turn on the lights when you go into a dark area. Replace any light bulbs as soon as they burn out.  Set up your furniture so you have a clear path. Avoid moving your furniture around.  If any of your floors are uneven, fix them.  If there are any pets around you, be aware of where they are.  Review your medicines with your doctor. Some medicines can make you feel dizzy. This can increase your chance of falling. Ask your doctor what other things that you can do to help prevent falls. This information is not intended to replace advice given to you by your health care provider. Make sure you discuss any questions you have with your health care provider. Document Released: 03/12/2009 Document Revised: 10/22/2015 Document Reviewed: 06/20/2014 Elsevier Interactive Patient Education  2017 [employer].

## 2019-11-18 NOTE — Progress Notes (Signed)
I connected with Daniel Woodard today by telephone and verified that I am speaking with the correct person using two identifiers. Location patient: home Location provider: work Persons participating in the virtual visit: Daniel Woodard and Lisette Abu, LPN   I discussed the limitations, risks, security and privacy concerns of performing an evaluation and management service by telephone and the availability of in person appointments. I also discussed with the patient that there may be a patient responsible charge related to this service. The patient expressed understanding and verbally consented to this telephonic visit.    Interactive audio and video telecommunications were attempted between this provider and patient, however failed, due to patient having technical difficulties OR patient did not have access to video capability.  We continued and completed visit with audio only.  Some vital signs may be absent or patient reported.   Time Spent with patient on telephone encounter: 20 minutes  Subjective:   Daniel Woodard is a 86 y.o. male who presents for Medicare Annual/Subsequent preventive examination.  Review of Systems    No ROS. Medicare Wellness Virtual Visit. Additional risk factors are reflected in social history. Cardiac Risk Factors include: advanced age (>26mn, >>13women);diabetes mellitus;dyslipidemia;family history of premature cardiovascular disease;hypertension;male gender     Objective:    There were no vitals filed for this visit. There is no height or weight on file to calculate BMI.  Advanced Directives 11/18/2019 03/06/2019 10/19/2017 05/18/2017 10/06/2016 10/01/2015 09/25/2014  Does Patient Have a Medical Advance Directive? Yes _0  No  Does patient want to make changes to medical advance directive? No - Patient declined - - - - - -  Would patient like information on creating a medical advance directive? - - No - Patient declined No - Patient  declined - - Yes - Educational materials given    Current Medications (verified) Outpatient Encounter Medications as of 11/18/2019  Medication Sig  . aspirin 325 MG tablet Take 1 tablet (325 mg total) by mouth daily.  . Coenzyme Q10 (COQ-10) 200 MG CAPS Take 200 mg by mouth daily.   . furosemide (LASIX) 20 MG tablet One tablet by mouth daily as needed for swelling  . metoprolol succinate (TOPROL-XL) 25 MG 24 hr tablet Take 0.5 tablets (12.5 mg total) by mouth daily.  .Marland Kitchenomeprazole (PRILOSEC) 20 MG capsule TAKE 1 CAPSULE BY MOUTH ONCE DAILY **ANNUAL  APPT  DUE  IN  JUNE  MUST  SEE  PROVIDER  FOR  FUTURE  REFILLS  . psyllium (METAMUCIL) 58.6 % powder Take 2 caps by mouth every day  . rosuvastatin (CRESTOR) 20 MG tablet Take 1 tablet (20 mg total) by mouth at bedtime. Annual appt due in June must see provider for future refills (Patient taking differently: Take 20 mg by mouth at bedtime. Annual appt due in June must see provider for future refills Per patient: Has been taking 1 tablet po every two days, skip a day, then every two days due to arm pain/ache.)  . VITAMIN D PO Take 1 tablet by mouth daily.   No facility-administered encounter medications on file as of 11/18/2019.    Allergies (verified) Ace inhibitors   History: Past Medical History:  Diagnosis Date  . ALLERGIC [place]INITIS 04/09/2007  . [place]   . BACTEREMIA, 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  . BENIGN PROSTATIC HYPERTROPHY 04/09/2007  . Blood transfusion without reported diagnosis    really not sure thinks 30-40 years ago  .  BRADYCARDIA 05/11/2009  . Carotid artery occlusion   . COLONIC POLYPS, HX OF 04/09/2007   ADENOMATOUS POLYPS 7616,0737  . Complete heart block ([place])   . COPD 04/09/2007  . Cough 03/27/2008  . DEPRESSION 04/09/2007  . DIABETES MELLITUS, TYPE II 10/09/2007  . DIVERTICULOSIS, COLON 04/09/2007  . DIZZINESS 10/06/2008  . GLUCOSE INTOLERANCE 04/09/2007  . HYPERLIPIDEMIA 03/27/2007    . HYPERTENSION 03/27/2007  . PERIPHERAL VASCULAR DISEASE 03/27/2007  . PNEUMONIA ORGANISM NOS 11/08/2007  . SHINGLES, HX OF 04/09/2007  . SHOULDER PAIN, BILATERAL 08/01/2007  . Ulcer    40 years ago   Past Surgical History:  Procedure Laterality Date  . APPENDECTOMY     86 years old  . CAROTID ENDARTERECTOMY  5/08   left  . CARPAL TUNNEL RELEASE     bilateral  . COLONOSCOPY    . ESOPHAGOGASTRODUODENOSCOPY  06/07/2004  . HEMORRHOID BANDING  1990s  . PACEMAKER INSERTION  07/11/2013   MDT ADDRL1 pacemaker implanted by Dr Lovena Le for complete heart block  . PERMANENT PACEMAKER INSERTION N/A 07/11/2013   Procedure: PERMANENT PACEMAKER INSERTION;  Surgeon: Evans Lance, MD;  Location: [facility] CATH LAB;  Service: Cardiovascular;  Laterality: N/A;  . s/p PUD surgury     ? partial gastrectomy   Family History  Problem Relation Age of Onset  . Hypertension Father   . Ulcers Father   . Hypertension Mother   . Hypertension Sister   . Peripheral vascular disease Brother   . Hypertension Sister   . Ulcers Sister        Ulcers  . Hypertension Sister   . Lung cancer Sister        smoked  . Heart disease Sister        Carotid   . Hypertension Sister   . Heart disease Other        Cardiovascular disorder, CHF  . Stroke Other        1st degree relative male and male  . Colon cancer Neg Hx    Social History   Socioeconomic History  . Marital status: Widowed    Spouse name: Not on file  . Number of children: 2  . Years of education: Not on file  . Highest education level: Not on file  Occupational History  . 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 Smoking status: Former Smoker    Packs/day: 1.00    Years: 50.00    Pack years: 50.00    Types: Cigarettes    Quit date: 08/20/1999    Years since quitting: 20.2  . Smokeless tobacco: Never Used  Vaping Use  . Vaping Use: Never used  Substance and Sexual Activity  . Alcohol use: No     Alcohol/week: 0.0 standard drinks  . Drug use: No  . Sexual activity: Not on file  Other Topics Concern  . Not on file  Social History Narrative   Widowed; wife dies 2018/08/26   Daily caffeine    Social Determinants of Health   Financial Resource Strain:   . Difficulty of Paying Living Expenses:   Food Insecurity:   . Worried About Charity fundraiser in the Last Year:   . Arboriculturist in the Last Year:   Transportation Needs:   . Film/video editor (Medical):   Marland Kitchen Lack of Transportation (Non-Medical):   Physical Activity:   . Days of Exercise per Week:   . Minutes of  Exercise per Session:   Stress:   . Feeling of Stress :   Social Connections:   . Frequency of Communication with Friends and Family:   . Frequency of Social Gatherings with Friends and Family:   . Attends Religious Services:   . Active Member of Clubs or Organizations:   . Attends Archivist Meetings:   Marland Kitchen Marital Status:     Tobacco Counseling Counseling given: Not Answered   Clinical Intake:  Pre-visit preparation completed: Yes  Pain : No/denies pain     Nutritional Risks: None Diabetes: Yes CBG done?: No Did pt. bring in CBG monitor from home?: No  How often do you need to have someone help you when you read instructions, pamphlets, or other written materials from your doctor or pharmacy?: 1 - Never What is the last grade level you completed in school?: High School Graduate  Diabetic? Yes  Interpreter Needed?: No  Information entered by :: Kimmi Acocella N. Jakeia Carreras, LPN   Activities of Daily Living In your present state of health, do you have any difficulty performing the following activities: 11/18/2019  Hearing? N  Vision? N  Difficulty concentrating or making decisions? N  Walking or climbing stairs? N  Dressing or bathing? N  Doing errands, shopping? N  Preparing Food and eating ? N  Using the Toilet? N  In the past six months, have you accidently leaked urine? N   Do you have problems with loss of bowel control? N  Managing your Medications? N  Managing your Finances? N  Housekeeping or managing your Housekeeping? N  Some recent data might be hidden    Patient Care Team: Biagio Borg, MD as PCP - General Evans Lance, MD as Consulting Physician (Cardiology) Tanda Rockers, MD as Consulting Physician (Pulmonary Disease)  Indicate any recent Medical Services you may have received from other than [facility] providers in the past year (date may be approximate).     Assessment:   This is a routine wellness examination for Daniel Woodard.  Hearing/Vision screen No exam data present  Dietary issues and exercise activities discussed: Current Exercise Habits: Home exercise routine, Type of exercise: walking, Time (Minutes): 30, Frequency (Times/Week): 5, Weekly Exercise (Minutes/Week): 150, Intensity: Moderate, Exercise limited by: respiratory conditions(s);cardiac condition(s)  Goals    . Patient Stated     Stay as healthy and as independent as possible, continue to exercise and eat healthy.      Depression Screen PHQ 2/9 Scores 11/18/2019 11/22/2018 05/17/2018 05/18/2017 05/18/2017 05/25/2016 11/20/2014  PHQ - 2 Score 0 1 0 3 0 0 0  PHQ- 9 Score - - - 3 0 - -    Fall Risk Fall Risk  11/18/2019 11/22/2018 05/17/2018 05/18/2017 05/18/2017  Falls in the past year? 0 0 0 No No  Number falls in past yr: 0 - - - -  Injury with Fall? 0 - - - -  Risk for fall due to : No Fall Risks - - - -  Follow up Falls evaluation completed - - - -    Any stairs in or around the home? Yes  If so, are there any without handrails? Yes  Home free of loose throw rugs in walkways, pet beds, electrical cords, etc? Yes  Adequate lighting in your home to reduce risk of falls? Yes   ASSISTIVE DEVICES UTILIZED TO PREVENT FALLS:  Life alert? No  Use of a cane, walker or w/c? No  Grab bars in the bathroom? Yes  Shower chair or bench in shower? No  Elevated toilet seat or a  handicapped toilet? No   TIMED UP AND GO: Not indicated  Was the test performed? No .  Length of time to ambulate 10 feet: 0 sec.   Gait steady and fast without use of assistive device according to patient Cognitive Function: MMSE - Mini Mental State Exam 05/18/2017  Orientation to time 5  Orientation to Place 5  Registration 3  Attention/ Calculation 5  Recall 2  Language- name 2 objects 2  Language- repeat 1  Language- follow 3 step command 3  Language- read & follow direction 1  Write a sentence 1  Copy design 1  Total score 29        Immunizations Immunization History  Administered Date(s) Administered  . Fluad Quad(high Dose 65+) 02/27/2019  . H1N1 05/05/2008  . Influenza Split 05/19/2011, 03/06/2012  . Influenza Whole 03/30/2000, 03/17/2008, 04/08/2009, 03/17/2010  . Influenza, High Dose Seasonal PF 02/03/2015, 02/10/2016, 03/10/2017  . Influenza,inj,Quad PF,6+ Mos 03/07/2013, 02/13/2014  . PFIZER SARS-COV-2 Vaccination 06/13/2019, 07/03/2019  . Pneumococcal Conjugate-13 05/21/2013  . Pneumococcal Polysaccharide-23 03/30/2005, 05/13/2010  . Td 07/29/1998, 04/08/2009  . Tdap 11/22/2018  . Zoster 04/07/2008    TDAP status: Due, Education has been provided regarding the importance of this vaccine. Advised may receive this vaccine at local pharmacy or Health Dept. Aware to provide a copy of the vaccination record if obtained from local pharmacy or Health Dept. Verbalized acceptance and understanding. Flu Vaccine status: Up to date Pneumococcal vaccine status: Up to date Covid-19 vaccine status: Completed vaccines  Qualifies for Shingles Vaccine? Yes   Zostavax completed Yes   Shingrix Completed?: No.    Education has been provided regarding the importance of this vaccine. Patient has been advised to call insurance company to determine out of pocket expense if they have not yet received this vaccine. Advised may also receive vaccine at local pharmacy or Health Dept.  Verbalized acceptance and understanding.  Screening Tests Health Maintenance  Topic Date Due  . OPHTHALMOLOGY EXAM  06/29/2018  . HEMOGLOBIN A1C  11/12/2019  . URINE MICROALBUMIN  11/15/2019  . FOOT EXAM  11/22/2019  . INFLUENZA VACCINE  12/29/2019  . TETANUS/TDAP  11/21/2028  . COVID-19 Vaccine  Completed  . PNA vac Low Risk Adult  Completed    Health Maintenance  Health Maintenance Due  Topic Date Due  . OPHTHALMOLOGY EXAM  06/29/2018  . HEMOGLOBIN A1C  11/12/2019  . URINE MICROALBUMIN  11/15/2019    Colorectal cancer screening: No longer required.   Lung Cancer Screening: (Low Dose CT Chest recommended if Age 34-80 years, 30 pack-year currently smoking OR have quit w/in 15years.) does not qualify.   Lung Cancer Screening Referral: No  Additional Screening:  Hepatitis C Screening: does not qualify; Completed:  No  Vision Screening: Recommended annual ophthalmology exams for early detection of glaucoma and other disorders of the eye. Is the patient up to date with their annual eye exam?  Yes  Who is the provider or what is the name of the office in which the patient attends annual eye exams? Verlene Mayer, MD If pt is not established with a provider, would they like to be referred to a provider to establish care? No .   Dental Screening: Recommended annual dental exams for proper oral hygiene  Community Resource Referral / Chronic Care Management: CRR required this visit?  No   CCM required this visit?  No  Plan:     Reviewed health maintenance screenings with patient today and relevant education, vaccines, and/or referrals were provided.    Continue doing brain stimulating activities (puzzles, reading, adult coloring books, staying active) to keep memory sharp.    Continue to eat heart healthy diet (full of fruits, vegetables, whole grains, lean protein, water--limit salt, fat, and sugar intake) and increase physical activity as tolerated.  I have  personally reviewed and noted the following in the patient's chart:   . Medical and social history . Use of alcohol, tobacco or illicit drugs  . Current medications and supplements . Functional ability and status . Nutritional status . Physical activity . Advanced directives . List of other physicians . Hospitalizations, surgeries, and ER visits in previous 12 months . Vitals . Screenings to include cognitive, depression, and falls . Referrals and appointments  In addition, I have reviewed and discussed with patient certain preventive protocols, quality metrics, and best practice recommendations. A written personalized care plan for preventive services as well as general preventive health recommendations were provided to patient.     Sheral Flow, LPN   [id number]   Nurse Notes:  There were no vitals filed for this visit. There is no height or weight on file to calculate BMI. After Visit Summary mailed to patient Cognitive function: not indicated due to patient is cogitatively intact.

## 2019-11-19 ENCOUNTER — Other Ambulatory Visit: Payer: Self-pay | Admitting: Internal Medicine

## 2019-11-19 NOTE — Telephone Encounter (Signed)
Please refill as per office routine med refill policy (all routine meds refilled for 3 mo or monthly per pt preference up to one year from last visit, then month to month grace period for 3 mo, then further med refills will have to be denied)  

## 2019-11-20 ENCOUNTER — Ambulatory Visit: Payer: Medicare Other | Admitting: Internal Medicine

## 2019-11-20 ENCOUNTER — Other Ambulatory Visit: Payer: Self-pay

## 2019-11-20 ENCOUNTER — Encounter: Payer: Self-pay | Admitting: Internal Medicine

## 2019-11-20 ENCOUNTER — Other Ambulatory Visit (INDEPENDENT_AMBULATORY_CARE_PROVIDER_SITE_OTHER): Payer: Medicare Other

## 2019-11-20 VITALS — BP 110/82 | HR 77 | Temp 97.6°F | Ht 73.0 in | Wt 194.0 lb

## 2019-11-20 DIAGNOSIS — E559 Vitamin D deficiency, unspecified: Secondary | ICD-10-CM | POA: Diagnosis not present

## 2019-11-20 DIAGNOSIS — J441 Chronic obstructive pulmonary disease with (acute) exacerbation: Secondary | ICD-10-CM

## 2019-11-20 DIAGNOSIS — E119 Type 2 diabetes mellitus without complications: Secondary | ICD-10-CM

## 2019-11-20 DIAGNOSIS — Z125 Encounter for screening for malignant neoplasm of prostate: Secondary | ICD-10-CM

## 2019-11-20 DIAGNOSIS — E538 Deficiency of other specified B group vitamins: Secondary | ICD-10-CM | POA: Diagnosis not present

## 2019-11-20 DIAGNOSIS — I10 Essential (primary) hypertension: Secondary | ICD-10-CM

## 2019-11-20 DIAGNOSIS — Z0001 Encounter for general adult medical examination with abnormal findings: Secondary | ICD-10-CM

## 2019-11-20 DIAGNOSIS — J309 Allergic rhinitis, unspecified: Secondary | ICD-10-CM

## 2019-11-20 DIAGNOSIS — Z0000 Encounter for general adult medical examination without abnormal findings: Secondary | ICD-10-CM | POA: Diagnosis not present

## 2019-11-20 DIAGNOSIS — E785 Hyperlipidemia, unspecified: Secondary | ICD-10-CM

## 2019-11-20 LAB — HEPATIC FUNCTION PANEL
ALT: 13 U/L (ref 0–53)
AST: 16 U/L (ref 0–37)
Albumin: 4.5 g/dL (ref 3.5–5.2)
Alkaline Phosphatase: 75 U/L (ref 39–117)
Bilirubin, Direct: 0.2 mg/dL (ref 0.0–0.3)
Total Bilirubin: 0.8 mg/dL (ref 0.2–1.2)
Total Protein: 7.8 g/dL (ref 6.0–8.3)

## 2019-11-20 LAB — URINALYSIS, ROUTINE W REFLEX MICROSCOPIC
Hgb urine dipstick: NEGATIVE
Ketones, ur: NEGATIVE
Leukocytes,Ua: NEGATIVE
Nitrite: NEGATIVE
Specific Gravity, Urine: >=1.03 — AB (ref 1.000–1.030)
Urine Glucose: NEGATIVE
Urobilinogen, UA: 0.2 (ref 0.0–1.0)
pH: 5 (ref 5.0–8.0)

## 2019-11-20 LAB — CBC WITH DIFFERENTIAL/PLATELET
Basophils Absolute: 0.1 10*3/uL (ref 0.0–0.1)
Basophils Relative: 0.8 % (ref 0.0–3.0)
Eosinophils Absolute: 0.2 10*3/uL (ref 0.0–0.7)
Eosinophils Relative: 2.6 % (ref 0.0–5.0)
HCT: 46 % (ref 39.0–52.0)
Hemoglobin: 15.3 g/dL (ref 13.0–17.0)
Lymphocytes Relative: 19.3 % (ref 12.0–46.0)
Lymphs Abs: 1.4 10*3/uL (ref 0.7–4.0)
MCHC: 33.3 g/dL (ref 30.0–36.0)
MCV: 85.2 fl (ref 78.0–100.0)
Monocytes Absolute: 0.8 10*3/uL (ref 0.1–1.0)
Monocytes Relative: 11 % (ref 3.0–12.0)
Neutro Abs: 5 10*3/uL (ref 1.4–7.7)
Neutrophils Relative %: 66.3 % (ref 43.0–77.0)
Platelets: 189 10*3/uL (ref 150.0–400.0)
RBC: 5.4 Mil/uL (ref 4.22–5.81)
RDW: 15.8 % — ABNORMAL HIGH (ref 11.5–15.5)
WBC: 7.5 10*3/uL (ref 4.0–10.5)

## 2019-11-20 LAB — BASIC METABOLIC PANEL
BUN: 30 mg/dL — ABNORMAL HIGH (ref 6–23)
CO2: 27 mEq/L (ref 19–32)
Calcium: 9.3 mg/dL (ref 8.4–10.5)
Chloride: 101 mEq/L (ref 96–112)
Creatinine, Ser: 1.69 mg/dL — ABNORMAL HIGH (ref 0.40–1.50)
GFR: 38.51 mL/min — ABNORMAL LOW (ref >60.00)
Glucose, Bld: 120 mg/dL — ABNORMAL HIGH (ref 70–99)
Potassium: 4.5 mEq/L (ref 3.5–5.1)
Sodium: 135 mEq/L (ref 135–145)

## 2019-11-20 LAB — LIPID PANEL
Cholesterol: 180 mg/dL (ref 0–200)
HDL: 35.8 mg/dL — ABNORMAL LOW (ref >39.00)
LDL Cholesterol: 108 mg/dL — ABNORMAL HIGH (ref 0–99)
NonHDL: 143.73
Total CHOL/HDL Ratio: 5
Triglycerides: 179 mg/dL — ABNORMAL HIGH (ref 0.0–149.0)
VLDL: 35.8 mg/dL (ref 0.0–40.0)

## 2019-11-20 LAB — VITAMIN D 25 HYDROXY (VIT D DEFICIENCY, FRACTURES): VITD: 45.98 ng/mL (ref 30.00–100.00)

## 2019-11-20 LAB — TSH: TSH: 4.03 u[IU]/mL (ref 0.35–4.50)

## 2019-11-20 LAB — MICROALBUMIN / CREATININE URINE RATIO
Creatinine,U: 260.6 mg/dL
Microalb Creat Ratio: 2.4 mg/g (ref 0.0–30.0)
Microalb, Ur: 6.3 mg/dL — ABNORMAL HIGH (ref 0.0–1.9)

## 2019-11-20 LAB — HEMOGLOBIN A1C: Hgb A1c MFr Bld: 6.7 % — ABNORMAL HIGH (ref 4.6–6.5)

## 2019-11-20 LAB — VITAMIN B12: Vitamin B-12: 479 pg/mL (ref 211–911)

## 2019-11-20 LAB — PSA: PSA: 0.92 ng/mL (ref 0.10–4.00)

## 2019-11-20 MED ORDER — PREDNISONE 10 MG PO TABS: ORAL_TABLET | ORAL | 0 refills | Status: DC

## 2019-11-20 MED ORDER — ROSUVASTATIN CALCIUM 20 MG PO TABS: 20.0000 mg | ORAL_TABLET | Freq: Every day | ORAL | 3 refills | Status: DC

## 2019-11-20 MED ORDER — METHYLPREDNISOLONE ACETATE 80 MG/ML IJ SUSP
80.0000 mg | Freq: Once | INTRAMUSCULAR | Status: AC
  Administered 2019-11-20: 80 mg via INTRAMUSCULAR

## 2019-11-20 MED ORDER — OMEPRAZOLE 20 MG PO CPDR: DELAYED_RELEASE_CAPSULE | ORAL | 3 refills | Status: DC

## 2019-11-20 NOTE — Progress Notes (Signed)
Subjective:    Patient ID: Daniel Woodard, male    DOB: 12-Jul-1929, 86 y.o.   MRN: [id number]  HPI  Here for wellness and f/u;  Overall doing ok;  Pt denies Chest pain, orthopnea, PND, worsening LE edema, palpitations, dizziness or syncope, but has had incidental 3 days onset sob/doe/cough non prod and wheezing.  Pt denies neurological change such as new headache, facial or extremity weakness.  Pt denies polydipsia, polyuria, or low sugar symptoms. Pt states overall good compliance with treatment and medications, good tolerability, and has been trying to follow appropriate diet.  Pt denies worsening depressive symptoms, suicidal ideation or panic. No fever, night sweats, wt loss, loss of appetite, or other constitutional symptoms.  Pt states good ability with ADL's, has low fall risk, home safety reviewed and adequate, no other significant changes in hearing or vision, and only occasionally active with exercise. Does have several wks ongoing nasal allergy symptoms with clearish congestion, itch and sneezing, without fever, pain Past Medical History:  Diagnosis Date  . ALLERGIC RHINITIS 04/09/2007  . Atrial fibrillation ([place])   . BACTEREMIA, 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  . BENIGN PROSTATIC HYPERTROPHY 04/09/2007  . Blood transfusion without reported diagnosis    really not sure thinks 30-40 years ago  . BRADYCARDIA 05/11/2009  . Carotid artery occlusion   . COLONIC POLYPS, HX OF 04/09/2007   ADENOMATOUS POLYPS 7829,5621  . Complete heart block ([place])   . COPD 04/09/2007  . Cough 03/27/2008  . DEPRESSION 04/09/2007  . DIABETES MELLITUS, TYPE II 10/09/2007  . DIVERTICULOSIS, COLON 04/09/2007  . DIZZINESS 10/06/2008  . GLUCOSE INTOLERANCE 04/09/2007  . HYPERLIPIDEMIA 03/27/2007  . HYPERTENSION 03/27/2007  . PERIPHERAL VASCULAR DISEASE 03/27/2007  . PNEUMONIA ORGANISM NOS 11/08/2007  . SHINGLES, HX OF 04/09/2007  . SHOULDER PAIN, BILATERAL 08/01/2007  . Ulcer    40 years ago    Past Surgical History:  Procedure Laterality Date  . APPENDECTOMY     86 years old  . CAROTID ENDARTERECTOMY  5/08   left  . CARPAL TUNNEL RELEASE     bilateral  . COLONOSCOPY    . ESOPHAGOGASTRODUODENOSCOPY  06/07/2004  . HEMORRHOID BANDING  1990s  . PACEMAKER INSERTION  07/11/2013   MDT ADDRL1 pacemaker implanted by Dr Lovena Le for complete heart block  . PERMANENT PACEMAKER INSERTION N/A 07/11/2013   Procedure: PERMANENT PACEMAKER INSERTION;  Surgeon: Evans Lance, MD;  Location: [facility] CATH LAB;  Service: Cardiovascular;  Laterality: N/A;  . s/p PUD surgury     ? partial gastrectomy    reports that he quit smoking about 20 years ago. His smoking use included cigarettes. He has a 50.00 pack-year smoking history. He has never used smokeless tobacco. He reports that he does not drink alcohol and does not use drugs. family history includes Heart disease in his sister and another family member; Hypertension in his father, mother, sister, sister, sister, and sister; Lung cancer in his sister; Peripheral vascular disease in his brother; Stroke in an other family member; Ulcers in his father and sister. Allergies  Allergen Reactions  . Ace Inhibitors Anaphylaxis    REACTION: cough REACTION: cough REACTION: cough REACTION: cough   Current Outpatient Medications on File Prior to Visit  Medication Sig Dispense Refill  . aspirin 325 MG tablet Take 1 tablet (325 mg total) by mouth daily.    . Coenzyme Q10 (COQ-10) 200 MG CAPS Take 200 mg by mouth daily.     . furosemide (LASIX) 20 MG  tablet One tablet by mouth daily as needed for swelling 90 tablet 3  . metoprolol succinate (TOPROL-XL) 25 MG 24 hr tablet Take 0.5 tablets (12.5 mg total) by mouth daily. 45 tablet 3  . psyllium (METAMUCIL) 58.6 % powder Take 2 caps by mouth every day    . VITAMIN D PO Take 1 tablet by mouth daily.     No current facility-administered medications on file prior to visit.   Review of Systems All otherwise neg  per pt     Objective:   Physical Exam BP 110/82 (BP Location: Left Arm, Patient Position: Sitting, Cuff Size: Large)   Pulse 77   Temp 97.6 F (36.4 C) (Oral)   Ht _0  (1.854 m)   Wt 194 lb (88 kg)   SpO2 96%   BMI 25.60 kg/m  VS noted,  Constitutional: Pt appears in NAD HENT: Head: NCAT.  Right Ear: External ear normal.  Left Ear: External ear normal.  Eyes: . Pupils are equal, round, and reactive to light. Conjunctivae and EOM are normal Nose: without d/c or deformity Neck: Neck supple. Gross normal ROM Cardiovascular: Normal rate and regular rhythm.   Pulmonary/Chest: Effort normal and breath sounds without rales but decreased bilat with few bilat insp wheezing.  Abd:  Soft, NT, ND, + BS, no organomegaly Neurological: Pt is alert. At baseline orientation, motor grossly intact Skin: Skin is warm. No rashes, other new lesions, no LE edema Psychiatric: Pt behavior is normal without agitation  All otherwise neg per pt Lab Results  Component Value Date   WBC 7.5 11/20/2019   HGB 15.3 11/20/2019   HCT 46.0 11/20/2019   PLT 189.0 11/20/2019   GLUCOSE 120 (H) 11/20/2019   CHOL 180 11/20/2019   TRIG 179.0 (H) 11/20/2019   HDL 35.80 (L) 11/20/2019   LDLDIRECT 189.9 10/02/2007   LDLCALC 108 (H) 11/20/2019   ALT 13 11/20/2019   AST 16 11/20/2019   NA 135 11/20/2019   K 4.5 11/20/2019   CL 101 11/20/2019   CREATININE 1.69 (H) 11/20/2019   BUN 30 (H) 11/20/2019   CO2 27 11/20/2019   TSH 4.03 11/20/2019   PSA 0.92 11/20/2019   INR 1.1 03/06/2019   HGBA1C 6.7 (H) 11/20/2019   MICROALBUR 6.3 (H) 11/20/2019      Assessment & Plan:

## 2019-11-26 ENCOUNTER — Encounter: Payer: Self-pay | Admitting: Internal Medicine

## 2019-11-26 NOTE — Assessment & Plan Note (Signed)

## 2019-11-26 NOTE — Assessment & Plan Note (Addendum)
Mild, for depomedrol IM 80, prepac asd,  to f/u any worsening symptoms or concerns  I spent 31 minutes in addition to time for CPX wellness examination in preparing to see the patient by review of recent labs, imaging and procedures, obtaining and reviewing separately obtained history, communicating with the patient and family or caregiver, ordering medications, tests or procedures, and documenting clinical information in the EHR including the differential Dx, treatment, and any further evaluation and other management of copd exacerbation, dm, htn, hld, allergies

## 2019-11-26 NOTE — Assessment & Plan Note (Signed)
stable overall by history and exam, recent data reviewed with pt, and pt to continue medical treatment as before,  to f/u any worsening symptoms or concerns  

## 2019-11-26 NOTE — Assessment & Plan Note (Signed)
stable overall by history and exam, recent data reviewed with pt, and pt to continue medical treatment as before,  to f/u any worsening symptoms or concerns, follow cbgs on steroid tx

## 2019-11-26 NOTE — Assessment & Plan Note (Signed)
Also to improved with steroid tx,  to f/u any worsening symptoms or concerns

## 2019-12-25 ENCOUNTER — Encounter (HOSPITAL_COMMUNITY): Payer: Self-pay

## 2019-12-25 NOTE — Progress Notes (Signed)
Sent a message in [place] of the covid drive-thru location change.

## 2019-12-30 ENCOUNTER — Other Ambulatory Visit (HOSPITAL_COMMUNITY)
Admission: RE | Admit: 2019-12-30 | Discharge: 2019-12-30 | Disposition: A | Discharge status: Home / Self Care | Payer: Medicare Other | Source: Ambulatory Visit | Attending: Internal Medicine | Admitting: Internal Medicine

## 2019-12-30 DIAGNOSIS — Z20822 Contact with and (suspected) exposure to covid-19: Secondary | ICD-10-CM | POA: Diagnosis not present

## 2019-12-30 DIAGNOSIS — Z01812 Encounter for preprocedural laboratory examination: Secondary | ICD-10-CM | POA: Insufficient documentation

## 2019-12-30 LAB — CUP PACEART INCLINIC DEVICE CHECK
Brady Statistic AP VP Percent: 18.4 %
Brady Statistic AP VS Percent: 9 %
Brady Statistic AS VP Percent: 61.5 %
Brady Statistic AS VS Percent: 11.1 %
Date Time Interrogation Session: 20210223171320
Implantable Lead Implant Date: 20150212
Implantable Lead Implant Date: 20150212
Implantable Lead Location: 753859
Implantable Lead Location: 753860
Implantable Lead Model: 5076
Implantable Lead Model: 5076
Implantable Pulse Generator Implant Date: 20150212
Lead Channel Pacing Threshold Amplitude: 0.75 V
Lead Channel Pacing Threshold Amplitude: 1 V
Lead Channel Pacing Threshold Pulse Width: 0.4 ms
Lead Channel Pacing Threshold Pulse Width: 0.4 ms
Lead Channel Sensing Intrinsic Amplitude: 5.6 mV

## 2019-12-30 LAB — SARS CORONAVIRUS 2 (TAT 6-24 HRS): SARS Coronavirus 2: NEGATIVE

## 2020-01-02 ENCOUNTER — Ambulatory Visit (INDEPENDENT_AMBULATORY_CARE_PROVIDER_SITE_OTHER): Payer: Medicare Other | Admitting: Internal Medicine

## 2020-01-02 ENCOUNTER — Other Ambulatory Visit: Payer: Self-pay

## 2020-01-02 ENCOUNTER — Encounter: Payer: Self-pay | Admitting: Internal Medicine

## 2020-01-02 ENCOUNTER — Ambulatory Visit: Payer: Medicare Other | Admitting: Internal Medicine

## 2020-01-02 DIAGNOSIS — J449 Chronic obstructive pulmonary disease, unspecified: Secondary | ICD-10-CM

## 2020-01-02 DIAGNOSIS — J84112 Idiopathic pulmonary fibrosis: Secondary | ICD-10-CM | POA: Diagnosis not present

## 2020-01-02 LAB — PULMONARY FUNCTION TEST
DL/VA % pred: 65 %
DL/VA: 2.46 ml/min/mmHg/L
DLCO cor % pred: 50 %
DLCO cor: 12.51 ml/min/mmHg
DLCO unc % pred: 50 %
DLCO unc: 12.51 ml/min/mmHg
FEF 25-75 Post: 0.95 L/sec
FEF 25-75 Pre: 0.64 L/sec
FEF2575-%Change-Post: 48 %
FEF2575-%Pred-Post: 52 %
FEF2575-%Pred-Pre: 35 %
FEV1-%Change-Post: 24 %
FEV1-%Pred-Post: 58 %
FEV1-%Pred-Pre: 46 %
FEV1-Post: 1.65 L
FEV1-Pre: 1.32 L
FEV1FVC-%Change-Post: 13 %
FEV1FVC-%Pred-Pre: 69 %
FEV6-%Change-Post: 10 %
FEV6-%Pred-Post: 78 %
FEV6-%Pred-Pre: 70 %
FEV6-Post: 2.96 L
FEV6-Pre: 2.67 L
FEV6FVC-%Change-Post: 1 %
FEV6FVC-%Pred-Post: 107 %
FEV6FVC-%Pred-Pre: 105 %
FVC-%Change-Post: 9 %
FVC-%Pred-Post: 73 %
FVC-%Pred-Pre: 66 %
FVC-Post: 2.98 L
FVC-Pre: 2.72 L
Post FEV1/FVC ratio: 55 %
Post FEV6/FVC ratio: 100 %
Pre FEV1/FVC ratio: 49 %
Pre FEV6/FVC Ratio: 98 %
RV % pred: 108 %
RV: 3.15 L
TLC % pred: 83 %
TLC: 6.23 L

## 2020-01-02 MED ORDER — BREZTRI AEROSPHERE 160-9-4.8 MCG/ACT IN AERO: 2.0000 | INHALATION_SPRAY | Freq: Two times a day (BID) | RESPIRATORY_TRACT | 0 refills | Status: DC

## 2020-01-02 MED ORDER — DULERA 100-5 MCG/ACT IN AERO: INHALATION_SPRAY | RESPIRATORY_TRACT | 11 refills | Status: DC

## 2020-01-02 NOTE — Progress Notes (Signed)
Full PFT performed today. °

## 2020-01-02 NOTE — Patient Instructions (Addendum)
Start  dulera 100 (or Breztri) take 2 puffs first thing in am and then another 2 puffs about 12 hours later.     Work on inhaler technique:  relax and gently blow all the way out then take a nice smooth deep breath back in, triggering the inhaler at same time you start breathing in.  Hold for up to 5 seconds if you can. Blow out thru nose. Rinse and gargle with water when done   Please schedule a follow up visit in 3 months but call sooner if needed  - bring inhaler with you and your drug formulary (your list of cheaper alternative)

## 2020-01-02 NOTE — Progress Notes (Signed)
Daniel Woodard, male    DOB: [birth year]    MRN: [id number]   Brief patient profile:  36 yowm MM/quit smoking 2001 self referred 08/11/14 to pulmonary clinic with GOLD II/ copd documented 11/28/2014 @ MMRC=1 and lost to f/u on no  maint resp rx  Then early fall 2020 gradually worse sob        History of Present Illness  07/05/2019  Pulmonary/ 1st office eval/Daniel Woodard  Chief Complaint  Patient presents with  . Consult    Patient is having shortness of breath with exertion and daily activites that started 6 months ago. Patient has a productive cough with grey sputum.   Dyspnea:  500 ft  To mailbox and has to sit on patio to get his breath = MMRC2 = can't walk a nl pace on a flat grade s sob but does fine slow and flat  Cough: varies mostly daytime / rattle min mucoid to brown occ in am  Sleep:  30-40 degrees on pillows   SABA use: none  rec Breztri Take 2 puffs first thing in am and then another 2 puffs about 12 hours later.  Work on inhaler technique:    Please remember to go to the lab and x-ray department   for your tests - we will call you with the results when they are available.   08/16/2019  f/u ov/Daniel Woodard re: GOLD II/ doe no change on breztri but never rechallenged  Chief Complaint  Patient presents with  . Follow-up    Breathing is doing well and no new co's today.   Dyspnea:  Breathing well but more  inactive  Cough: rattles a bit but no mucus Sleeping: able to lie flat now  SABA use: none 02: none  rec No changes in medications Make sure you check your oxygen saturations at highest level of activity to be sure it stays over 90% and call if trending dow    09/27/2019  f/u ov/Daniel Woodard re:   GOLD II  / pf with bronchiectasis / s/p both vaccinations pfizer  Chief Complaint  Patient presents with  . Follow-up  Dyspnea:  Does sams, sats usually > 90% RA  Cough: rattle some in am but no mucus  Sleeping: x one year poor wakes up p only a few hours s resp symptoms, no caffeine or  napping  SABA use: none  02: no Rec No change   01/02/2020  f/u ov/Daniel Woodard re: GOLD II/ AB component  Chief Complaint  Patient presents with  . Follow-up    PFT done today.  Breathing is overall doing well. He c/o rhinitis.   Dyspnea:  Walking at [employer]/ walks driveway  Cough: rattles worse in am > white  Sleeping: ok resp wise  SABA use: none  02: none    No obvious day to day or daytime variability or assoc excess/ purulent sputum or mucus plugs or hemoptysis or cp or chest tightness, subjective wheeze or overt sinus or hb symptoms.   Sleeping flat ok without nocturnal  or early am exacerbation  of respiratory  c/o's or need for noct saba. Also denies any obvious fluctuation of symptoms with weather or environmental changes or other aggravating or alleviating factors except as outlined above   No unusual exposure hx or h/o childhood pna/ asthma or knowledge of premature birth.  Current Allergies, Complete Past Medical History, Past Surgical History, Family History, and Social History were reviewed in [employer] record.  ROS  The following  are not active complaints unless bolded Hoarseness, sore throat, dysphagia, dental problems, itching, sneezing,  nasal congestion or discharge of excess mucus or purulent secretions, ear ache,   fever, chills, sweats, unintended wt loss or wt gain, classically pleuritic or exertional cp,  orthopnea pnd or arm/hand swelling  or leg swelling, presyncope, palpitations, abdominal pain, anorexia, nausea, vomiting, diarrhea  or change in bowel habits or change in bladder habits, change in stools or change in urine, dysuria, hematuria,  rash, arthralgias, visual complaints, headache, numbness, weakness or ataxia or problems with walking or coordination,  change in mood or  memory.        Current Meds  Medication Sig  . aspirin 325 MG tablet Take 1 tablet (325 mg total) by mouth daily.  . Coenzyme Q10 (COQ-10) 200 MG CAPS Take 200 mg by  mouth daily.   . furosemide (LASIX) 20 MG tablet One tablet by mouth daily as needed for swelling  . metoprolol succinate (TOPROL-XL) 25 MG 24 hr tablet Take 0.5 tablets (12.5 mg total) by mouth daily.  Marland Kitchen omeprazole (PRILOSEC) 20 MG capsule TAKE 1 CAPSULE BY MOUTH ONCE DAILY  . psyllium (METAMUCIL) 58.6 % powder Take 2 caps by mouth every day  . rosuvastatin (CRESTOR) 20 MG tablet Take 1 tablet (20 mg total) by mouth at bedtime.  Marland Kitchen VITAMIN D PO Take 1 tablet by mouth daily.                 Past Medical History:  Diagnosis Date  . ALLERGIC RHINITIS 04/09/2007  . Atrial fibrillation ([place])   . BACTEREMIA, 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  . BENIGN PROSTATIC HYPERTROPHY 04/09/2007  . Blood transfusion without reported diagnosis    really not sure thinks 30-40 years ago  . BRADYCARDIA 05/11/2009  . Carotid artery occlusion   . COLONIC POLYPS, HX OF 04/09/2007   ADENOMATOUS POLYPS 8546,2703  . Complete heart block ([place])   . COPD 04/09/2007  . Cough 03/27/2008  . DEPRESSION 04/09/2007  . DIABETES MELLITUS, TYPE II 10/09/2007  . DIVERTICULOSIS, COLON 04/09/2007  . DIZZINESS 10/06/2008  . GLUCOSE INTOLERANCE 04/09/2007  . HYPERLIPIDEMIA 03/27/2007  . HYPERTENSION 03/27/2007  . PERIPHERAL VASCULAR DISEASE 03/27/2007  . PNEUMONIA ORGANISM NOS 11/08/2007  . SHINGLES, HX OF 04/09/2007  . SHOULDER PAIN, BILATERAL 08/01/2007  . Ulcer    40 years ago       Objective:    01/02/2020         193   09/27/2019      197   08/16/19 195 lb (88.5 kg)  07/23/19 194 lb (88 kg)  07/05/19 194 lb (88 kg)     pleasant thin amb wm nad / rattling cough   Vital signs reviewed  01/02/2020  - Note at rest 02 sats  RA% on 96       Full dentures      HEENT : pt wearing mask not removed for exam due to covid - 19 concerns.    NECK :  without JVD/Nodes/TM/ nl carotid upstrokes bilaterally   LUNGS: no acc muscle use,  Mild barrel  contour chest wall with bilateral  Distant bs s audible  wheeze and  without cough on insp or exp maneuvers  and mild  Hyperresonant  to  percussion bilaterally     CV:  RRR  no s3 or murmur or increase in P2, and no edema   ABD:  soft and nontender with pos end  insp Hoover's  in the supine  position. No bruits or organomegaly appreciated, bowel sounds nl  MS:   Nl gait/  ext warm without deformities, calf tenderness, cyanosis or clubbing No obvious joint restrictions   SKIN: warm and dry without lesions    NEURO:  alert, approp, nl sensorium with  no motor or cerebellar deficits apparent.         Assessment     Outpatient Encounter Medications as of 01/02/2020  Medication Sig  . aspirin 325 MG tablet Take 1 tablet (325 mg total) by mouth daily.  . Coenzyme Q10 (COQ-10) 200 MG CAPS Take 200 mg by mouth daily.   . furosemide (LASIX) 20 MG tablet One tablet by mouth daily as needed for swelling  . metoprolol succinate (TOPROL-XL) 25 MG 24 hr tablet Take 0.5 tablets (12.5 mg total) by mouth daily.  Marland Kitchen omeprazole (PRILOSEC) 20 MG capsule TAKE 1 CAPSULE BY MOUTH ONCE DAILY  . psyllium (METAMUCIL) 58.6 % powder Take 2 caps by mouth every day  . rosuvastatin (CRESTOR) 20 MG tablet Take 1 tablet (20 mg total) by mouth at bedtime.  Marland Kitchen VITAMIN D PO Take 1 tablet by mouth daily.  . mometasone-formoterol (DULERA) 100-5 MCG/ACT AERO Take 2 puffs first thing in am and then another 2 puffs about 12 hours later.  .     .    .

## 2020-01-03 ENCOUNTER — Encounter: Payer: Self-pay | Admitting: Internal Medicine

## 2020-01-03 NOTE — Assessment & Plan Note (Signed)
Quit smoking 2001 - PFTs 11/28/14  FEV 2.00 (66%) ratio 63 and no change p saba, dlco 47 corrects to 69 for alv vol - 07/05/2019  After extensive coaching inhaler device,  effectiveness =    75% > try Breztri 2bid > no change so did not fill rx  - 07/06/19  Alpha one phenotype MM  Level 132  - PFT's  01/02/2020  FEV1 1.65 (58 % ) ratio 0.55  p 24 % improvement from saba p 0 prior to study with DLCO  12.51 (50%) corrects to 2.46 (65%)  for alv volume and FV curve concave  - 01/02/2020  After extensive coaching inhaler device,  effectiveness =    75% > try dulera 100 2bid   His insurance covers dulera so will try to address the cough with low dose dulera as he could not tell breztri helped, possibly because the higher dose irritated his upper airways more than helped the lower ones.

## 2020-01-03 NOTE — Assessment & Plan Note (Addendum)
Noted on cxr 07/05/2019 s acute changes clinically  - rec HRCT p levaquin 500 mg daily x 10 days  - HRCT chest 07/17/19 Spectrum of findings compatible with basilar predominant fibroticinterstitial lung disease with mild traction bronchiectasis and no frank honeycombing. Findings are categorized as probable UIP - 08/16/2019   Walked RA x 3 laps =  approx 790ft @ nl pace - stopped due to end of study  with sats of 93 % at the end of the study s sob  - 01/02/2020 PFTs  Vs 5 y prior:   FVC down from 3.16 to 2.72 and DLC0 down from 16.48 to 12.51 neither value corrected for hgb    C/w very slowly progressive UIP  - not interested in aggressive rx at this point but advised :  Make sure you check your oxygen saturations at highest level of activity to be sure it stays over 90% and keep track of it at least once a week, more often if breathing getting worse, and let me know if losing ground.   Medical decision making was a moderate level of complexity in this case because of  two chronic conditions /diagnoses requiring extra time for  H and P, chart review, counseling,teaching hfa and generating customized AVS unique to this office visit and charting - total time 36 min.   Each maintenance medication was reviewed in detail including emphasizing most importantly the difference between maintenance and prns and under what circumstances the prns are to be triggered using an action plan format where appropriate. Please see avs for details which were reviewed in writing by both me and my nurse and patient given a written copy highlighted where appropriate with yellow highlighter for the patient's continued care at home along with an updated version of their medications.  Patient was asked to maintain medication reconciliation by comparing this list to the actual medications being used at home and to contact this office right away if there is a conflict or discrepancy.

## 2020-01-08 DIAGNOSIS — H43821 Vitreomacular adhesion, right eye: Secondary | ICD-10-CM | POA: Diagnosis not present

## 2020-01-08 DIAGNOSIS — E119 Type 2 diabetes mellitus without complications: Secondary | ICD-10-CM | POA: Diagnosis not present

## 2020-01-08 DIAGNOSIS — H3554 Dystrophies primarily involving the retinal pigment epithelium: Secondary | ICD-10-CM | POA: Diagnosis not present

## 2020-01-08 DIAGNOSIS — H35053 Retinal neovascularization, unspecified, bilateral: Secondary | ICD-10-CM | POA: Diagnosis not present

## 2020-01-09 ENCOUNTER — Ambulatory Visit (INDEPENDENT_AMBULATORY_CARE_PROVIDER_SITE_OTHER): Payer: Medicare Other | Admitting: *Deleted

## 2020-01-09 DIAGNOSIS — I441 Atrioventricular block, second degree: Secondary | ICD-10-CM

## 2020-01-09 LAB — CUP PACEART REMOTE DEVICE CHECK
Battery Impedance: 724 Ohm
Battery Remaining Longevity: 69 mo
Battery Voltage: 2.76 V
Brady Statistic AP VP Percent: 24 %
Brady Statistic AP VS Percent: 0 %
Brady Statistic AS VP Percent: 76 %
Brady Statistic AS VS Percent: 0 %
Date Time Interrogation Session: 20210812124252
Implantable Lead Implant Date: 20150212
Implantable Lead Implant Date: 20150212
Implantable Lead Location: 753859
Implantable Lead Location: 753860
Implantable Lead Model: 5076
Implantable Lead Model: 5076
Implantable Pulse Generator Implant Date: 20150212
Lead Channel Impedance Value: 457 Ohm
Lead Channel Impedance Value: 508 Ohm
Lead Channel Pacing Threshold Amplitude: 0.625 V
Lead Channel Pacing Threshold Amplitude: 0.875 V
Lead Channel Pacing Threshold Pulse Width: 0.4 ms
Lead Channel Pacing Threshold Pulse Width: 0.4 ms
Lead Channel Setting Pacing Amplitude: 2 V
Lead Channel Setting Pacing Amplitude: 2.5 V
Lead Channel Setting Pacing Pulse Width: 0.4 ms
Lead Channel Setting Sensing Sensitivity: 4 mV

## 2020-01-13 NOTE — Progress Notes (Signed)
Remote pacemaker transmission.   

## 2020-02-04 DIAGNOSIS — L570 Actinic keratosis: Secondary | ICD-10-CM | POA: Diagnosis not present

## 2020-02-25 ENCOUNTER — Telehealth: Payer: Self-pay | Admitting: Internal Medicine

## 2020-02-25 NOTE — Telephone Encounter (Signed)
[employer] Called and states the patient is saying Dr.John told them to take rosuvastatin (CRESTOR) 20 MG tablet 2 x a time than take 1 day off. They are requesting a new rx stating this. I informed [employer] patient has not been seen at our office since 10/2019.   If you can confirm the change or not please call  - [phone] - Baxter Flattery @ [place]

## 2020-02-26 MED ORDER — ROSUVASTATIN CALCIUM 20 MG PO TABS: 40.0000 mg | ORAL_TABLET | ORAL | 3 refills | Status: DC

## 2020-02-26 NOTE — Telephone Encounter (Signed)
This was probably related to a discusssion that if the patient felt he could not tolerate daily tx, that he might go to qod - I will modify the rx

## 2020-02-26 NOTE — Telephone Encounter (Signed)
Sent to Dr. John to advise. 

## 2020-03-24 ENCOUNTER — Other Ambulatory Visit: Payer: Self-pay | Admitting: *Deleted

## 2020-03-24 DIAGNOSIS — I6529 Occlusion and stenosis of unspecified carotid artery: Secondary | ICD-10-CM

## 2020-03-31 ENCOUNTER — Ambulatory Visit (HOSPITAL_COMMUNITY)
Admission: RE | Admit: 2020-03-31 | Discharge: 2020-03-31 | Disposition: A | Discharge status: Home / Self Care | Payer: Medicare Other | Source: Ambulatory Visit | Attending: Physician Assistant | Admitting: Physician Assistant

## 2020-03-31 ENCOUNTER — Other Ambulatory Visit: Payer: Self-pay

## 2020-03-31 ENCOUNTER — Ambulatory Visit: Payer: Medicare Other | Admitting: Physician Assistant

## 2020-03-31 VITALS — BP 165/83 | HR 70 | Temp 98.1°F | Resp 20 | Ht 72.0 in | Wt 193.2 lb

## 2020-03-31 DIAGNOSIS — I6529 Occlusion and stenosis of unspecified carotid artery: Secondary | ICD-10-CM

## 2020-03-31 DIAGNOSIS — I6523 Occlusion and stenosis of bilateral carotid arteries: Secondary | ICD-10-CM | POA: Diagnosis not present

## 2020-03-31 NOTE — Progress Notes (Signed)
History of Present Illness:  Patient is a 86 y.o. year old male who presents for evaluation of carotid stenosis.  He is s/p left CEA by Dr. Amedeo Plenty in 2001.   The patient denies symptoms of TIA, amaurosis, or stroke.  The patient is currently on 325 mg ASA antiplatelet therapy.       Past Medical History:  Diagnosis Date  . ALLERGIC RHINITIS 04/09/2007  . Atrial fibrillation ([place])   . BACTEREMIA, 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  . BENIGN PROSTATIC HYPERTROPHY 04/09/2007  . Blood transfusion without reported diagnosis    really not sure thinks 30-40 years ago  . BRADYCARDIA 05/11/2009  . Carotid artery occlusion   . COLONIC POLYPS, HX OF 04/09/2007   ADENOMATOUS POLYPS 6546,5035  . Complete heart block ([place])   . COPD 04/09/2007  . Cough 03/27/2008  . DEPRESSION 04/09/2007  . DIABETES MELLITUS, TYPE II 10/09/2007  . DIVERTICULOSIS, COLON 04/09/2007  . DIZZINESS 10/06/2008  . GLUCOSE INTOLERANCE 04/09/2007  . HYPERLIPIDEMIA 03/27/2007  . HYPERTENSION 03/27/2007  . PERIPHERAL VASCULAR DISEASE 03/27/2007  . PNEUMONIA ORGANISM NOS 11/08/2007  . SHINGLES, HX OF 04/09/2007  . SHOULDER PAIN, BILATERAL 08/01/2007  . Ulcer    40 years ago    Past Surgical History:  Procedure Laterality Date  . APPENDECTOMY     86 years old  . CAROTID ENDARTERECTOMY  5/08   left  . CARPAL TUNNEL RELEASE     bilateral  . COLONOSCOPY    . ESOPHAGOGASTRODUODENOSCOPY  06/07/2004  . HEMORRHOID BANDING  1990s  . PACEMAKER INSERTION  07/11/2013   MDT ADDRL1 pacemaker implanted by Dr Lovena Le for complete heart block  . PERMANENT PACEMAKER INSERTION N/A 07/11/2013   Procedure: PERMANENT PACEMAKER INSERTION;  Surgeon: Evans Lance, MD;  Location: [facility] CATH LAB;  Service: Cardiovascular;  Laterality: N/A;  . s/p PUD surgury     ? partial gastrectomy     Social History Social History   Tobacco Use  . Smoking status: Former Smoker    Packs/day: 1.00    Years: 50.00    Pack years: 50.00     Types: Cigarettes    Quit date: 08/20/1999    Years since quitting: 20.6  . Smokeless tobacco: Never Used  Vaping Use  . Vaping Use: Never used  Substance Use Topics  . Alcohol use: No    Alcohol/week: 0.0 standard drinks  . Drug use: No    Family History Family History  Problem Relation Age of Onset  . Hypertension Father   . Ulcers Father   . Hypertension Mother   . Hypertension Sister   . Peripheral vascular disease Brother   . Hypertension Sister   . Ulcers Sister        Ulcers  . Hypertension Sister   . Lung cancer Sister        smoked  . Heart disease Sister        Carotid   . Hypertension Sister   . Heart disease Other        Cardiovascular disorder, CHF  . Stroke Other        1st degree relative male and male  . Colon cancer Neg Hx     Allergies  Allergies  Allergen Reactions  . Ace Inhibitors Anaphylaxis    REACTION: cough REACTION: cough REACTION: cough REACTION: cough     Current Outpatient Medications  Medication Sig Dispense Refill  . aspirin 325 MG tablet Take 1 tablet (325 mg  total) by mouth daily.    . Coenzyme Q10 (COQ-10) 200 MG CAPS Take 200 mg by mouth daily.     . furosemide (LASIX) 20 MG tablet One tablet by mouth daily as needed for swelling 90 tablet 3  . metoprolol succinate (TOPROL-XL) 25 MG 24 hr tablet Take 0.5 tablets (12.5 mg total) by mouth daily. 45 tablet 3  . omeprazole (PRILOSEC) 20 MG capsule TAKE 1 CAPSULE BY MOUTH ONCE DAILY 90 capsule 3  . psyllium (METAMUCIL) 58.6 % powder Take 2 caps by mouth every day    . rosuvastatin (CRESTOR) 20 MG tablet Take 2 tablets (40 mg total) by mouth every other day. 90 tablet 3  . VITAMIN D PO Take 1 tablet by mouth daily.     No current facility-administered medications for this visit.    ROS:   General:  No weight loss, Fever, chills  HEENT: No recent headaches, no nasal bleeding, no visual changes, no sore throat  Neurologic: No dizziness, blackouts, seizures. No recent  symptoms of stroke or mini- stroke. No recent episodes of slurred speech, or temporary blindness.  Cardiac: No recent episodes of chest pain/pressure, no shortness of breath at rest.  No shortness of breath with exertion.  Denies history of atrial fibrillation or irregular heartbeat  Vascular: No history of rest pain in feet.  No history of claudication.  No history of non-healing ulcer, No history of DVT   Pulmonary: No home oxygen, no productive cough, no hemoptysis,  No asthma or wheezing  Musculoskeletal:  _0  Arthritis, _1  Low back pain,  _2  Joint pain  Hematologic:No history of hypercoagulable state.  No history of easy bleeding.  No history of anemia  Gastrointestinal: No hematochezia or melena,  No gastroesophageal reflux, no trouble swallowing  Urinary: _3  chronic Kidney disease, _4  on HD - _5  MWF or _6  TTHS, _7  Burning with urination, _8  Frequent urination, _9  Difficulty urinating;   Skin: No rashes  Psychological: No history of anxiety,  No history of depression   Physical Examination  Vitals:   03/31/20 1050 03/31/20 1051  BP: 114/87 (!) 165/83  Pulse: 70   Resp: 20   Temp: 98.1 F (36.7 C)   TempSrc: Temporal   SpO2: 94%   Weight: 193 lb 3.2 oz (87.6 kg)   Height: 6' (1.829 m)     Body mass index is 26.2 kg/m.  General:  Alert and oriented, no acute distress HEENT: Normal Neck: No bruit or JVD Pulmonary: Clear to auscultation bilaterally Cardiac: Regular Rate and Rhythm without murmur Gastrointestinal: Soft, non-tender, non-distended, no mass, no scars Skin: No rash Extremity Pulses:  2+ radial, brachial, femoral, dorsalis pedis, posterior tibial pulses bilaterally Musculoskeletal: No deformity or edema  Neurologic: Upper and lower extremity motor 5/5 and symmetric  DATA:     Right Carotid Findings:  +----------+--------+--------+--------+--------------------------+--------+        PSV cm/sEDV cm/sStenosisPlaque Description       Comments  +----------+--------+--------+--------+--------------------------+--------+   CCA Prox 59   0                             +----------+--------+--------+--------+--------------------------+--------+   CCA Mid  67   6                             +----------+--------+--------+--------+--------------------------+--------+   CCA [id number]   11  heterogenous and irregular        +----------+--------+--------+--------+--------------------------+--------+   ICA Prox 109   18       heterogenous and irregular        +----------+--------+--------+--------+--------------------------+--------+   ICA Mid  91   12                            +----------+--------+--------+--------+--------------------------+--------+   ICA Distal94   17                            +----------+--------+--------+--------+--------------------------+--------+   ECA    224   4        heterogenous and irregular        +----------+--------+--------+--------+--------------------------+--------+    +----------+--------+-------+----------------+-------------------+       PSV cm/sEDV cmsDescribe    Arm Pressure (mmHG)  +----------+--------+-------+----------------+-------------------+  [id number]   0   Multiphasic, WNL            +----------+--------+-------+----------------+-------------------+   +---------+--------+--+--------+---------+  VertebralPSV cm/s40EDV cm/sAntegrade  +---------+--------+--+--------+---------+      Left Carotid Findings:  +----------+--------+--------+--------+------------------+--------+       PSV cm/sEDV cm/sStenosisPlaque DescriptionComments    +----------+--------+--------+--------+------------------+--------+  CCA Prox 84                          +----------+--------+--------+--------+------------------+--------+  CCA Mid  76   2                       +----------+--------+--------+--------+------------------+--------+  CCA Distal98   1        irregular           +----------+--------+--------+--------+------------------+--------+  ICA Prox 84   8                       +----------+--------+--------+--------+------------------+--------+  ICA Mid  90   16                      +----------+--------+--------+--------+------------------+--------+  ICA Distal86   10                      +----------+--------+--------+--------+------------------+--------+  ECA    99   0                       +----------+--------+--------+--------+------------------+--------+   +----------+--------+--------+----------+-------------------+       PSV cm/sEDV cm/sDescribe Arm Pressure (mmHG)  +----------+--------+--------+----------+-------------------+  [id number]   0    monophasic            +----------+--------+--------+----------+-------------------+   +---------+--------+--+--------+----------+  VertebralPSV cm/s71EDV cm/sRetrograde  +---------+--------+--+--------+----------+        Summary:  Right Carotid: Velocities in the right ICA are consistent with a 1-39%  stenosis.   Left Carotid: Velocities in the left ICA are consistent with a 1-39%  stenosis.   Vertebrals: Right vertebral artery demonstrates antegrade flow. Left  vertebral       artery demonstrates retrograde flow.   ASSESSMENT:  Carotid stenosis s/p left CEA by Dr. Amedeo Plenty in 2001 He remains asymptomatic for stroke and  TIA. Carotid duplex shows B < 39% B   PLAN:  He will return in 2 years for repeat carotid duplex.  Cont. ASA possibly down to 81 mg from 325 mg if this is OK with his primary provider.  Cont. To stay active.  We reviewed signs and symptoms of stroke  if these occur he will call 911.        Roxy Horseman PA-C [facility] Office: [phone]  MD in [facility]

## 2020-04-03 ENCOUNTER — Ambulatory Visit: Payer: Medicare Other | Admitting: Pulmonary Disease

## 2020-04-03 ENCOUNTER — Ambulatory Visit: Payer: Medicare Other | Admitting: Internal Medicine

## 2020-04-03 ENCOUNTER — Other Ambulatory Visit: Payer: Self-pay

## 2020-04-03 ENCOUNTER — Encounter: Payer: Self-pay | Admitting: Pulmonary Disease

## 2020-04-03 VITALS — BP 120/72 | HR 89 | Temp 97.4°F | Ht 73.0 in | Wt 195.4 lb

## 2020-04-03 DIAGNOSIS — R918 Other nonspecific abnormal finding of lung field: Secondary | ICD-10-CM

## 2020-04-03 DIAGNOSIS — J449 Chronic obstructive pulmonary disease, unspecified: Secondary | ICD-10-CM | POA: Diagnosis not present

## 2020-04-03 DIAGNOSIS — Z0000 Encounter for general adult medical examination without abnormal findings: Secondary | ICD-10-CM

## 2020-04-03 DIAGNOSIS — J84112 Idiopathic pulmonary fibrosis: Secondary | ICD-10-CM

## 2020-04-03 MED ORDER — ALBUTEROL SULFATE HFA 108 (90 BASE) MCG/ACT IN AERS: 2.0000 | INHALATION_SPRAY | Freq: Four times a day (QID) | RESPIRATORY_TRACT | 6 refills | Status: DC | PRN

## 2020-04-03 NOTE — Assessment & Plan Note (Signed)
Patient did not feel clinical benefit from [facility] 100 We reviewed pulmonary function testing from August/2021 He does not have a rescue inhaler at home He feels that his supplement that he started taking his helping his breathing He has no respiratory complaints today He has had stable walks in our office in the past  Plan: We will send prescription for rescue inhaler as he does have a positive bronchodilator response in FEV1 as well as mid flows He can continue to remain off of maintenance inhalers given his intolerance to [facility] 100 and the fact that he is asymptomatic today Would recommend that he remains active Follow-up with our office in 3 months He is up-to-date with COVID-19 vaccinations as well as flu vaccine

## 2020-04-03 NOTE — Assessment & Plan Note (Signed)
Up-to-date with flu vaccine as well as COVID-19 vaccinations

## 2020-04-03 NOTE — Assessment & Plan Note (Signed)
Seen on high-resolution CT chest in February/2021 Pulmonary function testing August/2021 reviewed with patient today HRCT from February/2021 reviewed with patient today Patient not interested in any sort of antifibrotic's Patient feels clinically stable with no acute respiratory concerns today  Plan: Can consider repeat CT imaging in February/2022 Continue to clinically monitor

## 2020-04-03 NOTE — Patient Instructions (Addendum)
You were seen today by Lauraine Rinne, NP  for:   1. COPD GOLD II / AB component   We reviewed your pulmonary function testing today as well as your last CT scan from February/2021.  He reported that he did not have a benefit from [facility] the inhaler that Dr. Work put you on.  I am okay with the remaining off of it as you were doing quite well at today's visit.  We will send in a rescue medication see the instructions listed below:  Only use your albuterol as a rescue medication to be used if you can't catch your breath by resting or doing a relaxed purse lip breathing pattern.  - The less you use it, the better it will work when you need it. - Ok to use up to 2 puffs  every 4 hours if you must but call for immediate appointment if use goes up over your usual need - Don't leave home without it !!  (think of it like the spare tire for your car)   2. UIP  probable dx by hrct   Your CT that we did in February/2021 does show some scarring on it.  We can consider repeating a CT in 1 year to follow this.  We discussed this at the visit today and he did not feel like he needed this.  Let us know if you change your mind.  3. Abnormal findings on diagnostic imaging of lung  Your CT that we did in February/2021 showed some small pulmonary nodules.  Radiology recommended a follow-up in 3 to 6 months.  We discussed this today and you did not feel like he needed that at this time.  We will bring you back to see Dr. Melvyn Novas in February/2022.  At that time he can discuss with him whether or not you feel you may benefit from having a repeat CT.  If you change your mind at all after thinking about this please let us know and we will be more than happy to accommodate   Follow Up:    Return in about 3 months (around 07/04/2020), or if symptoms worsen or fail to improve, for Follow up with Dr. Melvyn Novas.   Notification of test results are managed in the following manner: If there are  any recommendations or  changes to the  plan of care discussed in office today,  we will contact you and let you know what they are. If you do not hear from [place], then your results are normal and you can view them through your  MyChart account , or a letter will be sent to you. Thank you again for trusting [place] with your care  - Thank you, [facility]    It is flu season:   >>> Best ways to protect herself from the flu: Receive the yearly flu vaccine, practice good hand hygiene washing with soap and also using hand sanitizer when available, eat a nutritious meals, get adequate rest, hydrate appropriately       Please contact the office if your symptoms worsen or you have concerns that you are not improving.   Thank you for choosing [facility] for your healthcare, and for allowing [place] to partner with you on your healthcare journey. I am thankful to be able to provide care to you today.   Wyn Quaker FNP-C

## 2020-04-03 NOTE — Assessment & Plan Note (Signed)
February/2021 high-resolution CT chest showing UIP as well as pulmonary nodules Radiology recommending repeat CT in 3 to 6 months  Discussion: I reviewed this with the patient today.  I also discussed radiology's recommendations.  Patient does not feel that he needs a repeat CT at this time.  Patient declines a repeat CT.  Him and I discussed that we can bring him back in February/2022 for a follow-up visit with our office so we can discuss whether or not he would be agreeable to having a repeat CT at that time.  Patient is aware of the pulmonary nodules that are present.  As of right now he does not feel he would want any sort of invasive procedures or testing done given the fact that he is asymptomatic with his breathing as well as 86 years old.  Plan: Follow-up in 3 months.  Can consider repeat CT in February/2022 Encourage patient to call us if he has additional questions or concerns or if he would like to get a CT done sooner, given radiology's recommendations

## 2020-04-03 NOTE — Progress Notes (Signed)
_0  ID: Daniel Woodard, male    DOB: [birth year], 86 y.o.   MRN: [id number]  Chief Complaint  Patient presents with  . Follow-up    COPD , doing well no complaints    Referring provider: Biagio Borg, MD  HPI:  86 year old male former smoker followed in our office for COPD as well as ILD  PMH: Type 2 diabetes, hyperlipidemia, hyper tension, depression, CAD Smoker/ Smoking History: Former smoker but 2001.  50-pack-year smoking history Maintenance: Dulera 100 Pt of: Dr. Melvyn Novas  04/03/2020  - Visit   86 year old male former smoker followed in our office for COPD. He is established with Dr. Melvyn Novas. He is completing 30-monthfollow-up with our office today. He reports that overall he has been doing well. He reports that since last office visit he has stopped taking inhalers. He is now taking a over-the-counter supplement called Dr. SAundra Milletultimate lung and bronchial support.  Per chart review in August/2021 patient was prescribed Dulera 100.  Patient is also reporting that he stopped taking his Lasix.  Patient did not feel the Dulera 100 was helpful.  He felt like it made his breathing worse.  He does not have a rescue inhaler at home.  He has no respiratory complaints today.  Overall he feels that he is doing well.  Questionaires / Pulmonary Flowsheets:   ACT:  No flowsheet data found.  MMRC: mMRC Dyspnea Scale mMRC Score  09/27/2019 3    Epworth:  No flowsheet data found.  Tests:   07/18/2019-CT chest high-res-spectrum findings compatible with basilar predominant fibrotic interstitial lung disease with mild traction bronchiectasis no frank honeycombing, two solid pulmonary nodules largest being 6 mm in the left upper lobe, noncontrast chest CT in 3 to 6 months is recommended, trace dependent left pleural effusion, moderate to severe centrilobular emphysema  01/02/2020-pulmonary function test-pulmonary FVC-2.72 (66% predicted), postbronchodilator ratio 55,  postbronchodilator FEV1 1.65 (58% predicted), positive bronchodilator response in FEV1, mid flow reversibility, TLC 5.23 (69% predicted), DLCO 12.51 (50% predicted)  FENO:  No results found for: NITRICOXIDE  PFT: PFT Results Latest Ref Rng & Units 01/02/2020 11/28/2014  FVC-Pre L 2.72 3.16  FVC-Predicted Pre % 66 74  FVC-Post L 2.98 3.16  FVC-Predicted Post % 73 74  Pre FEV1/FVC % % 49 63  Post FEV1/FCV % % 55 63  FEV1-Pre L 1.32 2.00  FEV1-Predicted Pre % 46 66  FEV1-Post L 1.65 2.00  DLCO uncorrected ml/min/mmHg 12.51 16.48  DLCO UNC% % 50 47  DLCO corrected ml/min/mmHg 12.51 -  DLCO COR %Predicted % 50 -  DLVA Predicted % 65 69  TLC L 6.23 6.93  TLC % Predicted % 83 92  RV % Predicted % 108 123    WALK:  SIX MIN WALK 08/16/2019 07/05/2019  Supplimental Oxygen during Test? (L/min) No No  Tech Comments: Patient walked average pace and maintained good O2 sats. Patient walked average speed and maintained good O2 sats.    Imaging: VAS [place]  Result Date: 03/31/2020 Carotid Arterial Duplex Study Indications:       Left endarterectomy. Risk Factors:      Hypertension, hyperlipidemia, Diabetes. Other Factors:     07/18/1999: Left carotid endarterectomy. Comparison Study:  10/19/2017: Rt ICA 1-39%; Lt ICA 1-39%, monophasic                    subclavian, retrograde vertebral. Performing Technologist: MIvan Croft Examination Guidelines: A complete evaluation includes B-mode imaging, spectral Doppler, color Doppler,  and power Doppler as needed of all accessible portions of each vessel. Bilateral testing is considered an integral part of a complete examination. Limited examinations for reoccurring indications may be performed as noted.  Right Carotid Findings: +----------+--------+--------+--------+--------------------------+--------+           PSV cm/sEDV cm/sStenosisPlaque Description        Comments +----------+--------+--------+--------+--------------------------+--------+ CCA  Prox  59      0                                                  +----------+--------+--------+--------+--------------------------+--------+ CCA Mid   67      6                                                  +----------+--------+--------+--------+--------------------------+--------+ CCA Distal127     11              heterogenous and irregular         +----------+--------+--------+--------+--------------------------+--------+ ICA Prox  109     18              heterogenous and irregular         +----------+--------+--------+--------+--------------------------+--------+ ICA Mid   91      12                                                 +----------+--------+--------+--------+--------------------------+--------+ ICA Distal94      17                                                 +----------+--------+--------+--------+--------------------------+--------+ ECA       224     4               heterogenous and irregular         +----------+--------+--------+--------+--------------------------+--------+ +----------+--------+-------+----------------+-------------------+           PSV cm/sEDV cmsDescribe        Arm Pressure (mmHG) +----------+--------+-------+----------------+-------------------+ [id number]      0      Multiphasic, WNL                    +----------+--------+-------+----------------+-------------------+ +---------+--------+--+--------+---------+ VertebralPSV cm/s40EDV cm/sAntegrade +---------+--------+--+--------+---------+  Left Carotid Findings: +----------+--------+--------+--------+------------------+--------+           PSV cm/sEDV cm/sStenosisPlaque DescriptionComments +----------+--------+--------+--------+------------------+--------+ CCA Prox  84                                                 +----------+--------+--------+--------+------------------+--------+ CCA Mid   76      2                                           +----------+--------+--------+--------+------------------+--------+ CCA Distal98  1               irregular                  +----------+--------+--------+--------+------------------+--------+ ICA Prox  84      8                                          +----------+--------+--------+--------+------------------+--------+ ICA Mid   90      16                                         +----------+--------+--------+--------+------------------+--------+ ICA Distal86      10                                         +----------+--------+--------+--------+------------------+--------+ ECA       99      0                                          +----------+--------+--------+--------+------------------+--------+ +----------+--------+--------+----------+-------------------+           PSV cm/sEDV cm/sDescribe  Arm Pressure (mmHG) +----------+--------+--------+----------+-------------------+ [id number]      0       monophasic                    +----------+--------+--------+----------+-------------------+ +---------+--------+--+--------+----------+ VertebralPSV cm/s71EDV cm/sRetrograde +---------+--------+--+--------+----------+   Summary: Right Carotid: Velocities in the right ICA are consistent with a 1-39% stenosis. Left Carotid: Velocities in the left ICA are consistent with a 1-39% stenosis. Vertebrals: Right vertebral artery demonstrates antegrade flow. Left vertebral             artery demonstrates retrograde flow. *See table(s) above for measurements and observations.  Electronically signed by Monica Martinez MD on 03/31/2020 at 4:02:06 PM.    Final     Lab Results:  CBC    Component Value Date/Time   WBC 7.5 11/20/2019 1107   RBC 5.40 11/20/2019 1107   HGB 15.3 11/20/2019 1107   HCT 46.0 11/20/2019 1107   PLT 189.0 11/20/2019 1107   MCV 85.2 11/20/2019 1107   MCH 28.1 07/05/2019 1538   MCHC 33.3 11/20/2019 1107   RDW 15.8 (H) 11/20/2019 1107    LYMPHSABS 1.4 11/20/2019 1107   MONOABS 0.8 11/20/2019 1107   EOSABS 0.2 11/20/2019 1107   BASOSABS 0.1 11/20/2019 1107    BMET    Component Value Date/Time   NA 135 11/20/2019 1107   K 4.5 11/20/2019 1107   CL 101 11/20/2019 1107   CO2 27 11/20/2019 1107   GLUCOSE 120 (H) 11/20/2019 1107   BUN 30 (H) 11/20/2019 1107   CREATININE 1.69 (H) 11/20/2019 1107   CREATININE 1.39 (H) 07/05/2019 1538   CALCIUM 9.3 11/20/2019 1107   GFRNONAA 40 (L) 03/07/2019 0322   GFRAA 46 (L) 03/07/2019 0322    BNP    Component Value Date/Time   BNP 415 (H) 07/05/2019 1538    ProBNP No results found for: PROBNP  Specialty Problems      Pulmonary Problems   Allergic rhinitis    Qualifier: Diagnosis of  By: Jenny Reichmann MD, Hunt Oris   Overview:  Overview:  Qualifier: Diagnosis of  By: Jenny Reichmann MD, Hunt Oris      COPD GOLD II / AB component     Followed in Pulmonary clinic/ [facility]/ [place]  Quit smoking 2001 - PFTs 11/28/14  FEV 2.00 (66%) ratio 63 and no change p saba, dlco 47 corrects to 69 for alv vol - 07/05/2019  After extensive coaching inhaler device,  effectiveness =    75% > try Breztri 2bid > no change so did not fill rx  - 07/06/19  Alpha one phenotype MM  Level 132  - PFT's  01/02/2020  FEV1 1.65 (58 % ) ratio 0.55  p 24 % improvement from saba p 0 prior to study with DLCO  12.51 (50%) corrects to 2.46 (65%)  for alv volume and FV curve concave  - 01/02/2020  After extensive coaching inhaler device,  effectiveness =    75% > try dulera 100 2bid       Cough    Qualifier: Diagnosis of  By: Melvyn Novas MD, Christena Deem   Overview:  Overview:  Qualifier: Diagnosis of  By: Melvyn Novas MD, Christena Deem  Last Assessment & Plan:  Mild to mod,c/w bronchitis vs pna, for cxr, cough med, antiibiotic asd,  to f/u any worsening symptoms or concerns      COPD exacerbation ([place])    Overview:  Last Assessment & Plan:  Mild to mod, for depomedrol IM, predpac asd,   to f/u any worsening symptoms or concerns       DOE (dyspnea on exertion)   UIP  probable dx by hrct     Noted on cxr 07/05/2019 s acute changes clinically  - rec HRCT p levaquin 500 mg daily x 10 days  - HRCT chest 07/17/19 Spectrum of findings compatible with basilar predominant fibroticinterstitial lung disease with mild traction bronchiectasis and no frank honeycombing. Findings are categorized as probable UIP - 08/16/2019   Walked RA x 3 laps =  approx 768f @ nl pace - stopped due to end of study  with sats of 93 % at the end of the study s sob  - 01/02/2020 PFTs  Vs 5 y prior:   FVC down from 3.16 to 2.72 and DLC0 down from 16.48 to 12.51 neither value corrected for hgb            Allergies  Allergen Reactions  . Ace Inhibitors Anaphylaxis    REACTION: cough REACTION: cough REACTION: cough REACTION: cough    Immunization History  Administered Date(s) Administered  . Fluad Quad(high Dose 65+) 02/27/2019  . H1N1 05/05/2008  . Influenza Split 05/19/2011, 03/06/2012  . Influenza Whole 03/30/2000, 03/17/2008, 04/08/2009, 03/17/2010  . Influenza, High Dose Seasonal PF 02/03/2015, 02/10/2016, 03/10/2017  . Influenza,inj,Quad PF,6+ Mos 03/07/2013, 02/13/2014  . PFIZER SARS-COV-2 Vaccination 06/13/2019, 07/03/2019, 03/03/2020  . Pneumococcal Conjugate-13 05/21/2013  . Pneumococcal Polysaccharide-23 03/30/2005, 05/13/2010  . Td 07/29/1998, 04/08/2009  . Tdap 11/22/2018  . Zoster 04/07/2008    Past Medical History:  Diagnosis Date  . ALLERGIC RHINITIS 04/09/2007  . Atrial fibrillation (HGypsum   . BACTEREMIA, 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  . BENIGN PROSTATIC HYPERTROPHY 04/09/2007  . Blood transfusion without reported diagnosis    really not sure thinks 30-40 years ago  . BRADYCARDIA 05/11/2009  . Carotid artery occlusion   . COLONIC POLYPS, HX OF 04/09/2007   ADENOMATOUS POLYPS 21856,3149 . [place]   . COPD 04/09/2007  .  Cough 03/27/2008  . DEPRESSION 04/09/2007  . DIABETES MELLITUS, TYPE  II 10/09/2007  . DIVERTICULOSIS, COLON 04/09/2007  . DIZZINESS 10/06/2008  . GLUCOSE INTOLERANCE 04/09/2007  . HYPERLIPIDEMIA 03/27/2007  . HYPERTENSION 03/27/2007  . PERIPHERAL VASCULAR DISEASE 03/27/2007  . PNEUMONIA ORGANISM NOS 11/08/2007  . SHINGLES, HX OF 04/09/2007  . SHOULDER PAIN, BILATERAL 08/01/2007  . Ulcer    40 years ago    Tobacco History: Social History   Tobacco Use  Smoking Status Former Smoker  . Packs/day: 1.00  . Years: 50.00  . Pack years: 50.00  . Types: Cigarettes  . Quit date: 08/20/1999  . Years since quitting: 20.6  Smokeless Tobacco Never Used   Counseling given: Not Answered   Continue to not smoke  Outpatient Encounter Medications as of 04/03/2020  Medication Sig  . aspirin 325 MG tablet Take 1 tablet (325 mg total) by mouth daily.  . Coenzyme Q10 (COQ-10) 200 MG CAPS Take 200 mg by mouth daily.   . metoprolol succinate (TOPROL-XL) 25 MG 24 hr tablet Take 0.5 tablets (12.5 mg total) by mouth daily.  Marland Kitchen omeprazole (PRILOSEC) 20 MG capsule TAKE 1 CAPSULE BY MOUTH ONCE DAILY  . psyllium (METAMUCIL) 58.6 % powder Take 2 caps by mouth every day  . rosuvastatin (CRESTOR) 20 MG tablet Take 2 tablets (40 mg total) by mouth every other day.  Marland Kitchen VITAMIN D PO Take 1 tablet by mouth daily.  Marland Kitchen albuterol (VENTOLIN HFA) 108 (90 Base) MCG/ACT inhaler Inhale 2 puffs into the lungs every 6 (six) hours as needed for wheezing or shortness of breath.  . [DISCONTINUED] furosemide (LASIX) 20 MG tablet One tablet by mouth daily as needed for swelling   No facility-administered encounter medications on file as of 04/03/2020.     Review of Systems  Review of Systems  Constitutional: Negative for activity change, chills, fatigue, fever and unexpected weight change.  HENT: Negative for postnasal drip, rhinorrhea, sinus pressure, sinus pain and sore throat.   Eyes: Negative.   Respiratory: Negative for cough, shortness of breath and wheezing.   Cardiovascular: Negative  for chest pain and palpitations.  Gastrointestinal: Negative for constipation, diarrhea, nausea and vomiting.  Endocrine: Negative.   Genitourinary: Negative.   Musculoskeletal: Negative.   Skin: Negative.   Neurological: Negative for dizziness and headaches.  Psychiatric/Behavioral: Negative.  Negative for dysphoric mood. The patient is not nervous/anxious.   All other systems reviewed and are negative.    Physical Exam  BP 120/72 (BP Location: Left Arm, Cuff Size: Normal)   Pulse 89   Temp (!) 97.4 F (36.3 C) (Oral)   Ht _0  (1.854 m)   Wt 195 lb 6.4 oz (88.6 kg)   SpO2 97%   BMI 25.78 kg/m   Wt Readings from Last 5 Encounters:  04/03/20 195 lb 6.4 oz (88.6 kg)  03/31/20 193 lb 3.2 oz (87.6 kg)  01/02/20 193 lb (87.5 kg)  11/20/19 194 lb (88 kg)  09/27/19 197 lb 9.6 oz (89.6 kg)    BMI Readings from Last 5 Encounters:  04/03/20 25.78 kg/m  03/31/20 26.20 kg/m  01/02/20 26.18 kg/m  11/20/19 25.60 kg/m  09/27/19 26.07 kg/m     Physical Exam Vitals and nursing note reviewed.  Constitutional:      General: He is not in acute distress.    Appearance: Normal appearance. He is normal weight.  HENT:     Head: Normocephalic and atraumatic.     Right Ear: Hearing, tympanic membrane, ear  canal and external ear normal.     Left Ear: Hearing, tympanic membrane, ear canal and external ear normal.     Nose: Nose normal. No mucosal edema or rhinorrhea.     Right Turbinates: Not enlarged.     Left Turbinates: Not enlarged.     Mouth/Throat:     Mouth: Mucous membranes are dry.     Pharynx: Oropharynx is clear. No oropharyngeal exudate.  Eyes:     Pupils: Pupils are equal, round, and reactive to light.  Cardiovascular:     Rate and Rhythm: Normal rate and regular rhythm.     Pulses: Normal pulses.     Heart sounds: Normal heart sounds. No murmur heard.   Pulmonary:     Effort: Pulmonary effort is normal.     Breath sounds: Normal breath sounds. No decreased  breath sounds, wheezing or rales.  Abdominal:     General: Bowel sounds are normal.     Palpations: Abdomen is soft.     Tenderness: There is no abdominal tenderness.  Musculoskeletal:     Cervical back: Normal range of motion.     Right lower leg: No edema.     Left lower leg: No edema.  Lymphadenopathy:     Cervical: No cervical adenopathy.  Skin:    General: Skin is warm and dry.     Capillary Refill: Capillary refill takes less than 2 seconds.     Findings: No erythema or rash.  Neurological:     General: No focal deficit present.     Mental Status: He is alert and oriented to person, place, and time.     Motor: No weakness.     Coordination: Coordination normal.     Gait: Gait is intact. Gait normal.  Psychiatric:        Mood and Affect: Mood normal.        Behavior: Behavior normal. Behavior is cooperative.        Thought Content: Thought content normal.        Judgment: Judgment normal.       Assessment & Plan:   UIP  probable dx by hrct  Seen on high-resolution CT chest in February/2021 Pulmonary function testing August/2021 reviewed with patient today HRCT from February/2021 reviewed with patient today Patient not interested in any sort of antifibrotic's Patient feels clinically stable with no acute respiratory concerns today  Plan: Can consider repeat CT imaging in February/2022 Continue to clinically monitor   COPD GOLD II / AB component  Patient did not feel clinical benefit from [facility] 100 We reviewed pulmonary function testing from August/2021 He does not have a rescue inhaler at home He feels that his supplement that he started taking his helping his breathing He has no respiratory complaints today He has had stable walks in our office in the past  Plan: We will send prescription for rescue inhaler as he does have a positive bronchodilator response in FEV1 as well as mid flows He can continue to remain off of maintenance inhalers given his  intolerance to [facility] 100 and the fact that he is asymptomatic today Would recommend that he remains active Follow-up with our office in 3 months He is up-to-date with COVID-19 vaccinations as well as flu vaccine  Abnormal findings on diagnostic imaging of lung February/2021 high-resolution CT chest showing UIP as well as pulmonary nodules Radiology recommending repeat CT in 3 to 6 months  Discussion: I reviewed this with the patient today.  I also discussed  radiology's recommendations.  Patient does not feel that he needs a repeat CT at this time.  Patient declines a repeat CT.  Him and I discussed that we can bring him back in February/2022 for a follow-up visit with our office so we can discuss whether or not he would be agreeable to having a repeat CT at that time.  Patient is aware of the pulmonary nodules that are present.  As of right now he does not feel he would want any sort of invasive procedures or testing done given the fact that he is asymptomatic with his breathing as well as 86 years old.  Plan: Follow-up in 3 months.  Can consider repeat CT in February/2022 Encourage patient to call us if he has additional questions or concerns or if he would like to get a CT done sooner, given radiology's recommendations  Healthcare maintenance Up-to-date with flu vaccine as well as COVID-19 vaccinations    Return in about 3 months (around 07/04/2020), or if symptoms worsen or fail to improve, for Follow up with Dr. Melvyn Novas.   Lauraine Rinne, NP 04/03/2020   This appointment required 31 minutes of patient care (this includes precharting, chart review, review of results, face-to-face care, etc.).

## 2020-04-06 DIAGNOSIS — L570 Actinic keratosis: Secondary | ICD-10-CM | POA: Diagnosis not present

## 2020-04-09 ENCOUNTER — Ambulatory Visit (INDEPENDENT_AMBULATORY_CARE_PROVIDER_SITE_OTHER): Payer: Medicare Other

## 2020-04-09 DIAGNOSIS — I441 Atrioventricular block, second degree: Secondary | ICD-10-CM

## 2020-04-10 ENCOUNTER — Telehealth: Payer: Self-pay

## 2020-04-10 NOTE — Telephone Encounter (Signed)
Patient was returning a phone call to Cecille Rubin in regards to his device - called device clinic looking for Cecille Rubin and they gave me her phone number. Transferred call to [employer].

## 2020-04-10 NOTE — Telephone Encounter (Signed)
pATIENT NOTIFIED THAT TRANSMISSON WAS RECEIVED. qUESTIONS ANSWERED.

## 2020-04-10 NOTE — Telephone Encounter (Signed)
Received voice mail message that he would like to know if remote transmission was received.  Patient transferred to wrong number and will forward to [place] [facility] for follow up.

## 2020-04-11 LAB — CUP PACEART REMOTE DEVICE CHECK
Battery Impedance: 774 Ohm
Battery Remaining Longevity: 67 mo
Battery Voltage: 2.76 V
Brady Statistic AP VP Percent: 22 %
Brady Statistic AP VS Percent: 0 %
Brady Statistic AS VP Percent: 78 %
Brady Statistic AS VS Percent: 0 %
Date Time Interrogation Session: 20211112122944
Implantable Lead Implant Date: 20150212
Implantable Lead Implant Date: 20150212
Implantable Lead Location: 753859
Implantable Lead Location: 753860
Implantable Lead Model: 5076
Implantable Lead Model: 5076
Implantable Pulse Generator Implant Date: 20150212
Lead Channel Impedance Value: 444 Ohm
Lead Channel Impedance Value: 506 Ohm
Lead Channel Pacing Threshold Amplitude: 0.625 V
Lead Channel Pacing Threshold Amplitude: 0.875 V
Lead Channel Pacing Threshold Pulse Width: 0.4 ms
Lead Channel Pacing Threshold Pulse Width: 0.4 ms
Lead Channel Setting Pacing Amplitude: 2 V
Lead Channel Setting Pacing Amplitude: 2.5 V
Lead Channel Setting Pacing Pulse Width: 0.4 ms
Lead Channel Setting Sensing Sensitivity: 4 mV

## 2020-04-13 NOTE — Progress Notes (Signed)
Remote pacemaker transmission.   

## 2020-05-18 ENCOUNTER — Other Ambulatory Visit: Payer: Self-pay | Admitting: Internal Medicine

## 2020-05-18 NOTE — Telephone Encounter (Signed)
Please refill as per office routine med refill policy (all routine meds refilled for 3 mo or monthly per pt preference up to one year from last visit, then month to month grace period for 3 mo, then further med refills will have to be denied)  

## 2020-05-21 ENCOUNTER — Ambulatory Visit: Payer: Medicare Other | Admitting: Internal Medicine

## 2020-06-01 ENCOUNTER — Encounter (HOSPITAL_BASED_OUTPATIENT_CLINIC_OR_DEPARTMENT_OTHER): Payer: Self-pay | Admitting: Emergency Medicine

## 2020-06-01 ENCOUNTER — Ambulatory Visit: Payer: Medicare Other | Admitting: Internal Medicine

## 2020-06-01 ENCOUNTER — Other Ambulatory Visit: Payer: Self-pay

## 2020-06-01 ENCOUNTER — Emergency Department (HOSPITAL_BASED_OUTPATIENT_CLINIC_OR_DEPARTMENT_OTHER): Payer: Medicare Other

## 2020-06-01 ENCOUNTER — Inpatient Hospital Stay (HOSPITAL_BASED_OUTPATIENT_CLINIC_OR_DEPARTMENT_OTHER)
Admission: EM | Admit: 2020-06-01 | Discharge: 2020-06-03 | DRG: 286 | Disposition: A | Discharge status: Home / Self Care | Payer: Medicare Other | Attending: Internal Medicine | Admitting: Internal Medicine

## 2020-06-01 DIAGNOSIS — I82B12 Acute embolism and thrombosis of left subclavian vein: Secondary | ICD-10-CM | POA: Diagnosis present

## 2020-06-01 DIAGNOSIS — Z20822 Contact with and (suspected) exposure to covid-19: Secondary | ICD-10-CM | POA: Diagnosis not present

## 2020-06-01 DIAGNOSIS — Z950 Presence of cardiac pacemaker: Secondary | ICD-10-CM | POA: Diagnosis not present

## 2020-06-01 DIAGNOSIS — R059 Cough, unspecified: Secondary | ICD-10-CM | POA: Diagnosis present

## 2020-06-01 DIAGNOSIS — I509 Heart failure, unspecified: Secondary | ICD-10-CM | POA: Diagnosis not present

## 2020-06-01 DIAGNOSIS — J90 Pleural effusion, not elsewhere classified: Secondary | ICD-10-CM | POA: Diagnosis not present

## 2020-06-01 DIAGNOSIS — I2510 Atherosclerotic heart disease of native coronary artery without angina pectoris: Secondary | ICD-10-CM | POA: Diagnosis not present

## 2020-06-01 DIAGNOSIS — E038 Other specified hypothyroidism: Secondary | ICD-10-CM | POA: Diagnosis present

## 2020-06-01 DIAGNOSIS — Z888 Allergy status to other drugs, medicaments and biological substances status: Secondary | ICD-10-CM

## 2020-06-01 DIAGNOSIS — F32A Depression, unspecified: Secondary | ICD-10-CM | POA: Diagnosis present

## 2020-06-01 DIAGNOSIS — I5033 Acute on chronic diastolic (congestive) heart failure: Secondary | ICD-10-CM | POA: Diagnosis not present

## 2020-06-01 DIAGNOSIS — N1832 Chronic kidney disease, stage 3b: Secondary | ICD-10-CM | POA: Diagnosis not present

## 2020-06-01 DIAGNOSIS — E119 Type 2 diabetes mellitus without complications: Secondary | ICD-10-CM | POA: Diagnosis not present

## 2020-06-01 DIAGNOSIS — Z7982 Long term (current) use of aspirin: Secondary | ICD-10-CM | POA: Diagnosis not present

## 2020-06-01 DIAGNOSIS — E785 Hyperlipidemia, unspecified: Secondary | ICD-10-CM | POA: Diagnosis present

## 2020-06-01 DIAGNOSIS — I4821 Permanent atrial fibrillation: Secondary | ICD-10-CM | POA: Diagnosis not present

## 2020-06-01 DIAGNOSIS — I441 Atrioventricular block, second degree: Secondary | ICD-10-CM | POA: Diagnosis not present

## 2020-06-01 DIAGNOSIS — E1122 Type 2 diabetes mellitus with diabetic chronic kidney disease: Secondary | ICD-10-CM | POA: Diagnosis not present

## 2020-06-01 DIAGNOSIS — Z8249 Family history of ischemic heart disease and other diseases of the circulatory system: Secondary | ICD-10-CM | POA: Diagnosis not present

## 2020-06-01 DIAGNOSIS — I6522 Occlusion and stenosis of left carotid artery: Secondary | ICD-10-CM | POA: Diagnosis present

## 2020-06-01 DIAGNOSIS — I442 Atrioventricular block, complete: Secondary | ICD-10-CM | POA: Diagnosis not present

## 2020-06-01 DIAGNOSIS — I10 Essential (primary) hypertension: Secondary | ICD-10-CM | POA: Diagnosis not present

## 2020-06-01 DIAGNOSIS — I429 Cardiomyopathy, unspecified: Secondary | ICD-10-CM | POA: Diagnosis present

## 2020-06-01 DIAGNOSIS — I2584 Coronary atherosclerosis due to calcified coronary lesion: Secondary | ICD-10-CM | POA: Diagnosis present

## 2020-06-01 DIAGNOSIS — J441 Chronic obstructive pulmonary disease with (acute) exacerbation: Secondary | ICD-10-CM | POA: Diagnosis present

## 2020-06-01 DIAGNOSIS — N183 Chronic kidney disease, stage 3 unspecified: Secondary | ICD-10-CM | POA: Diagnosis present

## 2020-06-01 DIAGNOSIS — I5043 Acute on chronic combined systolic (congestive) and diastolic (congestive) heart failure: Secondary | ICD-10-CM | POA: Diagnosis present

## 2020-06-01 DIAGNOSIS — I5020 Unspecified systolic (congestive) heart failure: Secondary | ICD-10-CM | POA: Diagnosis not present

## 2020-06-01 DIAGNOSIS — Z823 Family history of stroke: Secondary | ICD-10-CM

## 2020-06-01 DIAGNOSIS — Z87891 Personal history of nicotine dependence: Secondary | ICD-10-CM

## 2020-06-01 DIAGNOSIS — Z79899 Other long term (current) drug therapy: Secondary | ICD-10-CM | POA: Diagnosis not present

## 2020-06-01 DIAGNOSIS — Z66 Do not resuscitate: Secondary | ICD-10-CM | POA: Diagnosis present

## 2020-06-01 DIAGNOSIS — I517 Cardiomegaly: Secondary | ICD-10-CM | POA: Diagnosis not present

## 2020-06-01 DIAGNOSIS — Z801 Family history of malignant neoplasm of trachea, bronchus and lung: Secondary | ICD-10-CM | POA: Diagnosis not present

## 2020-06-01 DIAGNOSIS — I110 Hypertensive heart disease with heart failure: Secondary | ICD-10-CM | POA: Diagnosis not present

## 2020-06-01 DIAGNOSIS — I130 Hypertensive heart and chronic kidney disease with heart failure and stage 1 through stage 4 chronic kidney disease, or unspecified chronic kidney disease: Principal | ICD-10-CM | POA: Diagnosis present

## 2020-06-01 DIAGNOSIS — J439 Emphysema, unspecified: Secondary | ICD-10-CM | POA: Diagnosis not present

## 2020-06-01 LAB — CBC WITH DIFFERENTIAL/PLATELET
Abs Immature Granulocytes: 0.02 10*3/uL (ref 0.00–0.07)
Basophils Absolute: 0 10*3/uL (ref 0.0–0.1)
Basophils Relative: 1 %
Eosinophils Absolute: 0.1 10*3/uL (ref 0.0–0.5)
Eosinophils Relative: 1 %
HCT: 44.3 % (ref 39.0–52.0)
Hemoglobin: 14.2 g/dL (ref 13.0–17.0)
Immature Granulocytes: 0 %
Lymphocytes Relative: 10 %
Lymphs Abs: 0.7 10*3/uL (ref 0.7–4.0)
MCH: 28.3 pg (ref 26.0–34.0)
MCHC: 32.1 g/dL (ref 30.0–36.0)
MCV: 88.4 fL (ref 80.0–100.0)
Monocytes Absolute: 0.7 10*3/uL (ref 0.1–1.0)
Monocytes Relative: 10 %
Neutro Abs: 5.5 10*3/uL (ref 1.7–7.7)
Neutrophils Relative %: 78 %
Platelets: 169 10*3/uL (ref 150–400)
RBC: 5.01 MIL/uL (ref 4.22–5.81)
RDW: 14.4 % (ref 11.5–15.5)
WBC: 7.1 10*3/uL (ref 4.0–10.5)
nRBC: 0 % (ref 0.0–0.2)

## 2020-06-01 LAB — BASIC METABOLIC PANEL
Anion gap: 9 (ref 5–15)
BUN: 22 mg/dL (ref 8–23)
CO2: 22 mmol/L (ref 22–32)
Calcium: 8.6 mg/dL — ABNORMAL LOW (ref 8.9–10.3)
Chloride: 105 mmol/L (ref 98–111)
Creatinine, Ser: 1.47 mg/dL — ABNORMAL HIGH (ref 0.61–1.24)
GFR, Estimated: 46 mL/min — ABNORMAL LOW (ref >60)
Glucose, Bld: 123 mg/dL — ABNORMAL HIGH (ref 70–99)
Potassium: 4.4 mmol/L (ref 3.5–5.1)
Sodium: 136 mmol/L (ref 135–145)

## 2020-06-01 LAB — RESP PANEL BY RT-PCR (FLU A&B, COVID) ARPGX2
Influenza A by PCR: NEGATIVE
Influenza B by PCR: NEGATIVE
SARS Coronavirus 2 by RT PCR: NEGATIVE

## 2020-06-01 LAB — GLUCOSE, CAPILLARY
Glucose-Capillary: 108 mg/dL — ABNORMAL HIGH (ref 70–99)
Glucose-Capillary: 153 mg/dL — ABNORMAL HIGH (ref 70–99)

## 2020-06-01 LAB — BRAIN NATRIURETIC PEPTIDE: B Natriuretic Peptide: 971.2 pg/mL — ABNORMAL HIGH (ref 0.0–100.0)

## 2020-06-01 LAB — TSH: TSH: 5.609 u[IU]/mL — ABNORMAL HIGH (ref 0.350–4.500)

## 2020-06-01 MED ORDER — SODIUM CHLORIDE 0.9% FLUSH
3.0000 mL | Freq: Two times a day (BID) | INTRAVENOUS | Status: DC
  Administered 2020-06-02: 3 mL via INTRAVENOUS

## 2020-06-01 MED ORDER — FUROSEMIDE 10 MG/ML IJ SOLN: 40.0000 mg | Freq: Two times a day (BID) | INTRAMUSCULAR | Status: DC

## 2020-06-01 MED ORDER — SODIUM CHLORIDE 0.9 % IV SOLN
250.0000 mL | INTRAVENOUS | Status: DC | PRN
Start: 2020-06-01 — End: 2020-06-03
  Administered 2020-06-01: 250 mL via INTRAVENOUS

## 2020-06-01 MED ORDER — SODIUM CHLORIDE 0.9% FLUSH: 3.0000 mL | INTRAVENOUS | Status: DC | PRN

## 2020-06-01 MED ORDER — PREDNISONE 20 MG PO TABS
40.0000 mg | ORAL_TABLET | Freq: Every day | ORAL | Status: DC
  Administered 2020-06-03: 40 mg via ORAL
  Filled 2020-06-01: qty 2

## 2020-06-01 MED ORDER — METOPROLOL SUCCINATE ER 25 MG PO TB24
12.5000 mg | ORAL_TABLET | Freq: Every day | ORAL | Status: DC
Start: 2020-06-02 — End: 2020-06-03
  Administered 2020-06-02 – 2020-06-03 (×2): 12.5 mg via ORAL
  Filled 2020-06-01 (×2): qty 1

## 2020-06-01 MED ORDER — ROSUVASTATIN CALCIUM 20 MG PO TABS: 20.0000 mg | ORAL_TABLET | ORAL | Status: DC

## 2020-06-01 MED ORDER — IPRATROPIUM-ALBUTEROL 0.5-2.5 (3) MG/3ML IN SOLN
3.0000 mL | Freq: Four times a day (QID) | RESPIRATORY_TRACT | Status: DC
  Administered 2020-06-01 – 2020-06-02 (×2): 3 mL via RESPIRATORY_TRACT
  Filled 2020-06-01 (×2): qty 3

## 2020-06-01 MED ORDER — FUROSEMIDE 10 MG/ML IJ SOLN
40.0000 mg | Freq: Once | INTRAMUSCULAR | Status: AC
  Administered 2020-06-01: 40 mg via INTRAVENOUS
  Filled 2020-06-01: qty 4

## 2020-06-01 MED ORDER — ACETAMINOPHEN 325 MG PO TABS: 650.0000 mg | ORAL_TABLET | ORAL | Status: DC | PRN

## 2020-06-01 MED ORDER — ASPIRIN 325 MG PO TABS
325.0000 mg | ORAL_TABLET | ORAL | Status: DC
  Administered 2020-06-03: 325 mg via ORAL
  Filled 2020-06-01: qty 1

## 2020-06-01 MED ORDER — ROSUVASTATIN CALCIUM 20 MG PO TABS
20.0000 mg | ORAL_TABLET | ORAL | Status: DC
  Administered 2020-06-01: 20 mg via ORAL
  Filled 2020-06-01: qty 1

## 2020-06-01 MED ORDER — ONDANSETRON HCL 4 MG/2ML IJ SOLN: 4.0000 mg | Freq: Four times a day (QID) | INTRAMUSCULAR | Status: DC | PRN

## 2020-06-01 MED ORDER — INSULIN ASPART 100 UNIT/ML ~~LOC~~ SOLN
0.0000 [IU] | Freq: Three times a day (TID) | SUBCUTANEOUS | Status: DC
  Administered 2020-06-02: 1 [IU] via SUBCUTANEOUS
  Administered 2020-06-02 (×2): 2 [IU] via SUBCUTANEOUS
  Administered 2020-06-03: 1 [IU] via SUBCUTANEOUS

## 2020-06-01 MED ORDER — METHYLPREDNISOLONE SODIUM SUCC 125 MG IJ SOLR
60.0000 mg | Freq: Four times a day (QID) | INTRAMUSCULAR | Status: AC
  Administered 2020-06-01 – 2020-06-02 (×4): 60 mg via INTRAVENOUS
  Filled 2020-06-01 (×4): qty 2

## 2020-06-01 MED ORDER — ENOXAPARIN SODIUM 40 MG/0.4ML ~~LOC~~ SOLN
40.0000 mg | SUBCUTANEOUS | Status: DC
  Administered 2020-06-01: 40 mg via SUBCUTANEOUS
  Filled 2020-06-01: qty 0.4

## 2020-06-01 MED ORDER — PANTOPRAZOLE SODIUM 40 MG PO TBEC
40.0000 mg | DELAYED_RELEASE_TABLET | Freq: Every day | ORAL | Status: DC
  Administered 2020-06-02 – 2020-06-03 (×2): 40 mg via ORAL
  Filled 2020-06-01 (×2): qty 1

## 2020-06-01 MED ORDER — SODIUM CHLORIDE 0.9 % IV SOLN
1.0000 g | INTRAVENOUS | Status: DC
  Administered 2020-06-01 – 2020-06-02 (×2): 1 g via INTRAVENOUS
  Filled 2020-06-01 (×2): qty 10

## 2020-06-01 NOTE — Progress Notes (Signed)
Nurse called Kathlene November [phone] per MD request , to have pt's pacemaker interrogated.  Kathlene November will be coming tomorrow. MD aware.

## 2020-06-01 NOTE — H&P (Addendum)
History and Physical    Daniel Woodard YSA:[id number] DOB: February 01, 1930 DOA: 06/01/2020  Referring MD/NP/PA: Calvert Cantor, MD PCP: Biagio Borg, MD  Patient coming from: Transfer from [facility]  Chief Complaint: Shortness of breath  I have personally briefly reviewed patient's old medical records in [place]   HPI: Daniel Woodard is a 87 y.o. male with medical history significant of atrial fibrillation, complete heart block s/p pacemaker, hypertension, hyperlipidemia, COPD, and diabetes mellitus type 2 presents with complaints of progressively worsening shortness of breath over the last week.  Reports that he has been having a difficult time walking any distance without feeling out of breath.  He chronically has a cough, but reports that he may have been coughing more.  Denies having any fever, lower extremity swelling, chest pain, nausea, vomiting, diarrhea, or change in weight.  Patient reports that his weight stays around 185 to 190 pounds.  At night symptoms seem to worsen at night to the point which she was unable to sleep for feeling as though he was going to stop breathing.  Reported associated symptoms of wheezing.  He has received all 3 of his COVID-19 vaccinations and also been vaccinated against flu.  Patient had been using his inhaler more frequently and had been prescribed every 6 hours without any relief in symptoms.  He has never had a COPD exacerbation requiring steroids or hospitalization before in the past.  ED Course: Upon admission into the emergency department patient was seen to be afebrile, respirations 16-25, blood pressure 159/88-177/89, and O2 saturations currently maintained on room air.  Labs are significant for BNP 971.2.  Chest x-ray significant for influenza and COVID-19 screening were both negative.  Review of Systems  Constitutional: Positive for malaise/fatigue. Negative for fever.  HENT: Negative for congestion and nosebleeds.   Eyes: Negative for  photophobia and pain.  Respiratory: Positive for cough, sputum production, shortness of breath and wheezing.   Cardiovascular: Positive for orthopnea. Negative for chest pain and leg swelling.  Gastrointestinal: Negative for nausea and vomiting.  Genitourinary: Negative for dysuria and hematuria.  Musculoskeletal: Negative for falls and joint pain.  Skin: Negative for itching.  Neurological: Negative for focal weakness and loss of consciousness.  Endo/Heme/Allergies: Negative for polydipsia.  Psychiatric/Behavioral: Negative for substance abuse. The patient has insomnia.     Past Medical History:  Diagnosis Date  . ALLERGIC RHINITIS 04/09/2007  . Atrial fibrillation ([place])   . BACTEREMIA, 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  . BENIGN PROSTATIC HYPERTROPHY 04/09/2007  . Blood transfusion without reported diagnosis    really not sure thinks 30-40 years ago  . BRADYCARDIA 05/11/2009  . Carotid artery occlusion   . COLONIC POLYPS, HX OF 04/09/2007   ADENOMATOUS POLYPS 3235,5732  . Complete heart block ([place])   . COPD 04/09/2007  . Cough 03/27/2008  . DEPRESSION 04/09/2007  . DIABETES MELLITUS, TYPE II 10/09/2007  . DIVERTICULOSIS, COLON 04/09/2007  . DIZZINESS 10/06/2008  . GLUCOSE INTOLERANCE 04/09/2007  . HYPERLIPIDEMIA 03/27/2007  . HYPERTENSION 03/27/2007  . PERIPHERAL VASCULAR DISEASE 03/27/2007  . PNEUMONIA ORGANISM NOS 11/08/2007  . SHINGLES, HX OF 04/09/2007  . SHOULDER PAIN, BILATERAL 08/01/2007  . Ulcer    40 years ago    Past Surgical History:  Procedure Laterality Date  . APPENDECTOMY     87 years old  . CAROTID ENDARTERECTOMY  5/08   left  . CARPAL TUNNEL RELEASE     bilateral  . COLONOSCOPY    . ESOPHAGOGASTRODUODENOSCOPY  06/07/2004  .  HEMORRHOID BANDING  1990s  . PACEMAKER INSERTION  07/11/2013   MDT ADDRL1 pacemaker implanted by Dr Lovena Le for complete heart block  . PERMANENT PACEMAKER INSERTION N/A 07/11/2013   Procedure: PERMANENT PACEMAKER INSERTION;   Surgeon: Evans Lance, MD;  Location: [facility] CATH LAB;  Service: Cardiovascular;  Laterality: N/A;  . s/p PUD surgury     ? partial gastrectomy     reports that he quit smoking about 20 years ago. His smoking use included cigarettes. He has a 50.00 pack-year smoking history. He has never used smokeless tobacco. He reports that he does not drink alcohol and does not use drugs.  Allergies  Allergen Reactions  . Ace Inhibitors Anaphylaxis    REACTION: cough REACTION: cough REACTION: cough REACTION: cough    Family History  Problem Relation Age of Onset  . Hypertension Father   . Ulcers Father   . Hypertension Mother   . Hypertension Sister   . Peripheral vascular disease Brother   . Hypertension Sister   . Ulcers Sister        Ulcers  . Hypertension Sister   . Lung cancer Sister        smoked  . Heart disease Sister        Carotid   . Hypertension Sister   . Heart disease Other        Cardiovascular disorder, CHF  . Stroke Other        1st degree relative male and male  . Colon cancer Neg Hx     Prior to Admission medications   Medication Sig Start Date End Date Taking? Authorizing Provider  albuterol (VENTOLIN HFA) 108 (90 Base) MCG/ACT inhaler Inhale 2 puffs into the lungs every 6 (six) hours as needed for wheezing or shortness of breath. 04/03/20   Lauraine Rinne, NP  aspirin 325 MG tablet Take 1 tablet (325 mg total) by mouth daily. 03/08/19   Geradine Girt, DO  Coenzyme Q10 (COQ-10) 200 MG CAPS Take 200 mg by mouth daily.     [provider]  metoprolol succinate (TOPROL-XL) 25 MG 24 hr tablet Take 1/2 (one-half) tablet by mouth once daily 05/19/20   Biagio Borg, MD  omeprazole (PRILOSEC) 20 MG capsule TAKE 1 CAPSULE BY MOUTH ONCE DAILY 11/20/19   Biagio Borg, MD  psyllium (METAMUCIL) 58.6 % powder Take 2 caps by mouth every day    [provider]  rosuvastatin (CRESTOR) 20 MG tablet Take 2 tablets (40 mg total) by mouth every other day. 02/26/20    Biagio Borg, MD  VITAMIN D PO Take 1 tablet by mouth daily.    [provider]    Physical Exam:  Constitutional: Elderly male that appears to be in no acute distress Vitals:   06/01/20 1130 06/01/20 1230 06/01/20 1400 06/01/20 1430  BP: (!) 159/88 (!) 167/94 (!) 165/90 (!) 168/92  Pulse: 82 86 83 82  Resp: 16 (!) 21 (!) 21 (!) 25  Temp:      TempSrc:      SpO2: 93% 94% 93% 92%  Weight:      Height:       Eyes: PERRL, lids and conjunctivae normal ENMT: Mucous membranes are moist. Posterior pharynx clear of any exudate or lesions.  Neck: normal, supple, no masses, no thyromegaly Respiratory: Expiratory wheezes noted in both lung fields.  Patient able to talk in fairly complete sentences at this time. Cardiovascular: Regular rate and rhythm, no murmurs / rubs /  gallops.  At least +1 pitting extremity edema. 2+ pedal pulses. No carotid bruits.  Abdomen: no tenderness, no masses palpated. No hepatosplenomegaly. Bowel sounds positive.  Musculoskeletal: no clubbing / cyanosis. No joint deformity upper and lower extremities. Good ROM, no contractures. Normal muscle tone.  Skin: no rashes, lesions, ulcers. No induration Neurologic: CN 2-12 grossly intact. Sensation intact, DTR normal. Strength 5/5 in all 4.  Psychiatric: Normal judgment and insight. Alert and oriented x 3. Normal mood.     Labs on Admission: I have personally reviewed following labs and imaging studies  CBC: Recent Labs  Lab 06/01/20 1002  WBC 7.1  NEUTROABS 5.5  HGB 14.2  HCT 44.3  MCV 88.4  PLT 403   Basic Metabolic Panel: Recent Labs  Lab 06/01/20 1002  NA 136  K 4.4  CL 105  CO2 22  GLUCOSE 123*  BUN 22  CREATININE 1.47*  CALCIUM 8.6*   GFR: Estimated Creatinine Clearance: 39.3 mL/min (A) (by C-G formula based on SCr of 1.47 mg/dL (H)). Liver Function Tests: No results for input(s): AST, ALT, ALKPHOS, BILITOT, PROT, ALBUMIN in the last 168 hours. No results for input(s): LIPASE,  AMYLASE in the last 168 hours. No results for input(s): AMMONIA in the last 168 hours. Coagulation Profile: No results for input(s): INR, PROTIME in the last 168 hours. Cardiac Enzymes: No results for input(s): CKTOTAL, CKMB, CKMBINDEX, TROPONINI in the last 168 hours. BNP (last 3 results) No results for input(s): PROBNP in the last 8760 hours. HbA1C: No results for input(s): HGBA1C in the last 72 hours. CBG: No results for input(s): GLUCAP in the last 168 hours. Lipid Profile: No results for input(s): CHOL, HDL, LDLCALC, TRIG, CHOLHDL, LDLDIRECT in the last 72 hours. Thyroid Function Tests: No results for input(s): TSH, T4TOTAL, FREET4, T3FREE, THYROIDAB in the last 72 hours. Anemia Panel: No results for input(s): VITAMINB12, FOLATE, FERRITIN, TIBC, IRON, RETICCTPCT in the last 72 hours. Urine analysis:    Component Value Date/Time   COLORURINE YELLOW 11/20/2019 1107   APPEARANCEUR CLEAR 11/20/2019 1107   LABSPEC >=1.030 (A) 11/20/2019 1107   PHURINE 5.0 11/20/2019 1107   GLUCOSEU NEGATIVE 11/20/2019 1107   HGBUR NEGATIVE 11/20/2019 1107   BILIRUBINUR SMALL (A) 11/20/2019 1107   KETONESUR NEGATIVE 11/20/2019 1107   PROTEINUR NEGATIVE 03/06/2019 1148   UROBILINOGEN 0.2 11/20/2019 1107   NITRITE NEGATIVE 11/20/2019 1107   LEUKOCYTESUR NEGATIVE 11/20/2019 1107   Sepsis Labs: Recent Results (from the past 240 hour(s))  Resp Panel by RT-PCR (Flu A&B, Covid) Nasopharyngeal Swab     Status: None   Collection Time: 06/01/20 10:02 AM   Specimen: Nasopharyngeal Swab; Nasopharyngeal(NP) swabs in vial transport medium  Result Value Ref Range Status   SARS Coronavirus 2 by RT PCR NEGATIVE NEGATIVE Final    Comment: (NOTE) SARS-CoV-2 target nucleic acids are NOT DETECTED.  The SARS-CoV-2 RNA is generally detectable in upper respiratory specimens during the acute phase of infection. The lowest concentration of SARS-CoV-2 viral copies this assay can detect is 138 copies/mL. A negative  result does not preclude SARS-Cov-2 infection and should not be used as the sole basis for treatment or other patient management decisions. A negative result may occur with  improper specimen collection/handling, submission of specimen other than nasopharyngeal swab, presence of viral mutation(s) within the areas targeted by this assay, and inadequate number of viral copies(<138 copies/mL). A negative result must be combined with clinical observations, patient history, and epidemiological information. The expected result is Negative.  Fact  Sheet for Patients:  EntrepreneurPulse.com.au  Fact Sheet for Healthcare Providers:  IncredibleEmployment.be  This test is no t yet approved or cleared by the [place] FDA and  has been authorized for detection and/or diagnosis of SARS-CoV-2 by FDA under an Emergency Use Authorization (EUA). This EUA will remain  in effect (meaning this test can be used) for the duration of the COVID-19 declaration under Section 564(b)(1) of the Act, 21 U.S.C.section 360bbb-3(b)(1), unless the authorization is terminated  or revoked sooner.       Influenza A by PCR NEGATIVE NEGATIVE Final   Influenza B by PCR NEGATIVE NEGATIVE Final    Comment: (NOTE) The Xpert Xpress SARS-CoV-2/FLU/RSV plus assay is intended as an aid in the diagnosis of influenza from Nasopharyngeal swab specimens and should not be used as a sole basis for treatment. Nasal washings and aspirates are unacceptable for Xpert Xpress SARS-CoV-2/FLU/RSV testing.  Fact Sheet for Patients: EntrepreneurPulse.com.au  Fact Sheet for Healthcare Providers: IncredibleEmployment.be  This test is not yet approved or cleared by the [place] FDA and has been authorized for detection and/or diagnosis of SARS-CoV-2 by FDA under an Emergency Use Authorization (EUA). This EUA will remain in effect (meaning this test can be used)  for the duration of the COVID-19 declaration under Section 564(b)(1) of the Act, 21 U.S.C. section 360bbb-3(b)(1), unless the authorization is terminated or revoked.  Performed at [facility], [address] [place], [place] [address]      Radiological Exams on Admission: DG Chest [facility] 1 View  Result Date: 06/01/2020 CLINICAL DATA:  Increased shortness of breath, cough, COPD, hypertension EXAM: PORTABLE CHEST 1 VIEW COMPARISON:  07/17/2019, 07/05/2019 FINDINGS: Chronic hyperinflation compatible with known background COPD/emphysema. Upper lobes remain clear. Heart is enlarged with increased bibasilar interstitial opacities concerning for known chronic basilar fibrosis and possibly mild superimposed basilar edema. Difficult to exclude small pleural effusions. Two lead left subclavian pacemaker as before. Trachea midline. No pneumothorax. Aorta atherosclerotic. IMPRESSION: Cardiomegaly with increased basilar interstitial opacities suggesting component of basilar edema superimposed on chronic pulmonary fibrosis. Suspect small pleural effusions. Findings suggest early CHF. Aortic Atherosclerosis (ICD10-I70.0) and Emphysema (ICD10-J43.9). Electronically Signed   By: Jerilynn Mages.  Shick M.D.   On: 06/01/2020 10:27    EKG: Independently reviewed.  Sinus rhythm 85 bpm with QTc 505  Assessment/Plan  Diastolic congestive heart failure exacerbation: Acute.  Presents with complaints of shortness of breath and orthopnea.  On physical exam with at least 1+ pitting lower extremity edema.  Chest x-ray showing cardiomegaly with increased basilar interstitial opacities suggestive of edema superimposed on chronic pulmonary fibrosis.  BNP was elevated at 971.2.  Last echocardiogram revealed EF of 60-65% in 03/07/2019.  Initially patient had been given 40 mg of Lasix in the ED and had put out at least 2 L of urine.  He was evaluated in the ReDS clip diuretic study, but IV Lasix was recommended to be held after his  reading was lower than normal.  Suspect symptoms likely secondary to CHF exacerbation and COPD.  -Admit to a telemetry bed -Heart failure orders set  initiated  -Continuous pulse oximetry with nasal cannula oxygen as needed to keep O2 saturations >92% -Strict I&Os and daily weights -Elevate lower extremities -Check TSH -Reassess in a.m. and adjust diuresis as needed. -Check echocardiogram -Message sent for cardiology to evaluate in a.m.  COPD exacerbation: Acute.  Patient noted to have expiratory wheezes on physical exam.  Currently not requiring oxygen to maintain O2 saturations. -COPD  order set utilized -DuoNebs 4 times daily -Solu-Medrol 60 mg IV x1 day then start prednisone p.o. -Empiric antibiotics of Rocephin -PT/OT to eval and treat  Chronic kidney disease stage IIIb: On admission creatinine 1.47 which appears near around patient's baseline. -Continue to monitor kidney function with diarrhea  Essential hypertension: On admission blood pressures elevated up to 177/89.  Home blood pressure medications include metoprolol 12.5 mg daily which patient had already taken for today -Continue beta-blocker  Complete heart block s/p pacemaker: Patient followed by Dr. Lovena Le of cardiology in the outpatient setting.  -Care order to interrogate pacemaker  Diabetes mellitus type 2: Patient admits that he was told that he had a touch of sugar in the past, but currently not on any medications for treatment.  Records show last hemoglobin A1c noted to be 6.7 in 10/2019. -Hypoglycemic protocol -CBGs before every meal with sensitive SSI while on steroids -Adjust insulin regimen if need  Hyperlipidemia: Patient takes 20 mg of Crestor orally for 2 nights straight and then we will skip the third night and then repeat. -Continue Crestor per home regimen  DNR: Patient confirmed that he would like to keep the DO NOT RESUSCITATE order in place with daughter present at bedside.  DVT prophylaxis:  Lovenox Code Status: DNR Family Communication: Daughter updated at bedside Disposition Plan: Likely discharge home in 2 to 3 days Consults called: Message sent for cardiology to evaluate in a.m. Admission status: Inpatient, require more than 2 midnight stay for shortness of breath  Norval Morton MD [facility]   If 7PM-7AM, please contact night-coverage   06/01/2020, 3:53 PM

## 2020-06-01 NOTE — ED Notes (Signed)
Walked pt per EDP with pulse ox pt got SOB walking to room 7 from room 4. Pts SPO2 dropped to 93 % room air when pt got back to bed his SOB got worse and his SPO2 dropped to 91%. Notified EDP

## 2020-06-01 NOTE — Progress Notes (Signed)
  ReDS Clip Diuretic Study Pt study # [id number]  Your patient has been enrolled in the ReDS Clip Diuretic Study  87 yo M with worsening shortness of breath, orthopnea, DOE. BNP elevated to 971.2. SCr 1.47, weight 186 lbs. Has received IV lasix 40 mg x 1 and has already put out almost 2L of urine. IV lasix 40 mg BID ordered currently.   Changes to prescribed diuretics recommended:  Stop IV lasix  Provider contacted: Dr. Katrinka Blazing Recommendation was accepted by provider.    REDS Clip  READING= 22%  CHEST RULER = 36 Clip Station = D   Orthodema score = 2 (orthopnea only) Signs/Symptoms Score   Mild edema, no orthopnea 0 No congestion  Moderate edema, no orthopnea 1 Low-grade orthodema/congestion  Severe edema OR orthopnea 2   Moderate edema and orthopnea 3 High-grade orthodema/congestion  Severe edema AND orthopnea 4    Sharen Hones, PharmD, BCPS Heart Failure Stewardship Pharmacist Phone [phone]  Please check [email or web address] for unit-specific pharmacist phone numbers

## 2020-06-01 NOTE — ED Provider Notes (Signed)
[facility] EMERGENCY DEPARTMENT Provider Note  CSN: [id number] Arrival date & time: 06/01/20 7416    History Chief Complaint  Patient presents with  . Shortness of Breath    HPI  Daniel Woodard is a 87 y.o. male with history of COPD has had worsening SOB over the last several days, not associated with cough or fever. No chest pain. He has had dyspnea with exertion and orthopnea the last couple of nights. He has inhalers he uses at home but does not use nebs or home oxygen. No prior heart problems or CHF.   Past Medical History:  Diagnosis Date  . ALLERGIC RHINITIS 04/09/2007  . Atrial fibrillation ([place])   . BACTEREMIA, 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  . BENIGN PROSTATIC HYPERTROPHY 04/09/2007  . Blood transfusion without reported diagnosis    really not sure thinks 30-40 years ago  . BRADYCARDIA 05/11/2009  . Carotid artery occlusion   . COLONIC POLYPS, HX OF 04/09/2007   ADENOMATOUS POLYPS [id number]  . Complete heart block ([place])   . COPD 04/09/2007  . Cough 03/27/2008  . DEPRESSION 04/09/2007  . DIABETES MELLITUS, TYPE II 10/09/2007  . DIVERTICULOSIS, COLON 04/09/2007  . DIZZINESS 10/06/2008  . GLUCOSE INTOLERANCE 04/09/2007  . HYPERLIPIDEMIA 03/27/2007  . HYPERTENSION 03/27/2007  . PERIPHERAL VASCULAR DISEASE 03/27/2007  . PNEUMONIA ORGANISM NOS 11/08/2007  . SHINGLES, HX OF 04/09/2007  . SHOULDER PAIN, BILATERAL 08/01/2007  . Ulcer    40 years ago    Past Surgical History:  Procedure Laterality Date  . APPENDECTOMY     87 years old  . CAROTID ENDARTERECTOMY  5/08   left  . CARPAL TUNNEL RELEASE     bilateral  . COLONOSCOPY    . ESOPHAGOGASTRODUODENOSCOPY  06/07/2004  . HEMORRHOID BANDING  1990s  . PACEMAKER INSERTION  07/11/2013   MDT ADDRL1 pacemaker implanted by Dr Lovena Le for complete heart block  . PERMANENT PACEMAKER INSERTION N/A 07/11/2013   Procedure: PERMANENT PACEMAKER INSERTION;  Surgeon: Evans Lance, MD;  Location: [facility] CATH LAB;   Service: Cardiovascular;  Laterality: N/A;  . s/p PUD surgury     ? partial gastrectomy    Family History  Problem Relation Age of Onset  . Hypertension Father   . Ulcers Father   . Hypertension Mother   . Hypertension Sister   . Peripheral vascular disease Brother   . Hypertension Sister   . Ulcers Sister        Ulcers  . Hypertension Sister   . Lung cancer Sister        smoked  . Heart disease Sister        Carotid   . Hypertension Sister   . Heart disease Other        Cardiovascular disorder, CHF  . Stroke Other        1st degree relative male and male  . Colon cancer Neg Hx     Social History   Tobacco Use  . Smoking status: Former Smoker    Packs/day: 1.00    Years: 50.00    Pack years: 50.00    Types: Cigarettes    Quit date: 08/20/1999    Years since quitting: 20.7  . Smokeless tobacco: Never Used  Vaping Use  . Vaping Use: Never used  Substance Use Topics  . Alcohol use: No    Alcohol/week: 0.0 standard drinks  . Drug use: No     Home Medications Prior to Admission medications   Medication Sig  Start Date End Date Taking? Authorizing Provider  albuterol (VENTOLIN HFA) 108 (90 Base) MCG/ACT inhaler Inhale 2 puffs into the lungs every 6 (six) hours as needed for wheezing or shortness of breath. 04/03/20   Lauraine Rinne, NP  aspirin 325 MG tablet Take 1 tablet (325 mg total) by mouth daily. 03/08/19   Geradine Girt, DO  Coenzyme Q10 (COQ-10) 200 MG CAPS Take 200 mg by mouth daily.     [provider]  metoprolol succinate (TOPROL-XL) 25 MG 24 hr tablet Take 1/2 (one-half) tablet by mouth once daily 05/19/20   Biagio Borg, MD  omeprazole (PRILOSEC) 20 MG capsule TAKE 1 CAPSULE BY MOUTH ONCE DAILY 11/20/19   Biagio Borg, MD  psyllium (METAMUCIL) 58.6 % powder Take 2 caps by mouth every day    [provider]  rosuvastatin (CRESTOR) 20 MG tablet Take 2 tablets (40 mg total) by mouth every other day. 02/26/20   Biagio Borg, MD  VITAMIN  D PO Take 1 tablet by mouth daily.    [provider]     Allergies    Ace inhibitors   Review of Systems   Review of Systems A comprehensive review of systems was completed and negative except as noted in HPI.    Physical Exam BP (!) 167/94   Pulse 86   Temp (!) 97.1 F (36.2 C) (Axillary)   Resp (!) 21   Ht _0  (1.854 m)   Wt 85.3 kg   SpO2 94%   BMI 24.80 kg/m   Physical Exam Vitals and nursing note reviewed.  Constitutional:      Appearance: Normal appearance.  HENT:     Head: Normocephalic and atraumatic.     Nose: Nose normal.     Mouth/Throat:     Mouth: Mucous membranes are moist.  Eyes:     Extraocular Movements: Extraocular movements intact.     Conjunctiva/sclera: Conjunctivae normal.  Cardiovascular:     Rate and Rhythm: Normal rate.  Pulmonary:     Effort: Pulmonary effort is normal.     Breath sounds: Rhonchi present.  Abdominal:     General: Abdomen is flat.     Palpations: Abdomen is soft.     Tenderness: There is no abdominal tenderness.  Musculoskeletal:        General: No swelling. Normal range of motion.     Cervical back: Neck supple.     Right lower leg: Edema present.     Left lower leg: Edema present.  Skin:    General: Skin is warm and dry.  Neurological:     General: No focal deficit present.     Mental Status: He is alert.  Psychiatric:        Mood and Affect: Mood normal.      ED Results / Procedures / Treatments   Labs (all labs ordered are listed, but only abnormal results are displayed) Labs Reviewed  BASIC METABOLIC PANEL - Abnormal; Notable for the following components:      Result Value   Glucose, Bld 123 (*)    Creatinine, Ser 1.47 (*)    Calcium 8.6 (*)    GFR, Estimated 46 (*)    All other components within normal limits  BRAIN NATRIURETIC PEPTIDE - Abnormal; Notable for the following components:   B Natriuretic Peptide 971.2 (*)    All other components within normal limits  RESP PANEL BY  RT-PCR (FLU A&B, COVID) ARPGX2  CBC WITH  DIFFERENTIAL/PLATELET    EKG EKG Interpretation  Date/Time:  Monday June 01 2020 11:19:24 EST Ventricular Rate:  90 PR Interval:    QRS Duration: 165 QT Interval:  428 QTC Calculation: 524 R Axis:   -82 Text Interpretation: Sinus rhythm Consider left atrial enlargement IVCD, consider atypical RBBB LVH with secondary repolarization abnormality No significant change since last tracing Confirmed by Calvert Cantor [phone]) on 06/01/2020 11:21:49 AM    Radiology DG Chest Port 1 View  Result Date: 06/01/2020 CLINICAL DATA:  Increased shortness of breath, cough, COPD, hypertension EXAM: PORTABLE CHEST 1 VIEW COMPARISON:  07/17/2019, 07/05/2019 FINDINGS: Chronic hyperinflation compatible with known background COPD/emphysema. Upper lobes remain clear. Heart is enlarged with increased bibasilar interstitial opacities concerning for known chronic basilar fibrosis and possibly mild superimposed basilar edema. Difficult to exclude small pleural effusions. Two lead left subclavian pacemaker as before. Trachea midline. No pneumothorax. Aorta atherosclerotic. IMPRESSION: Cardiomegaly with increased basilar interstitial opacities suggesting component of basilar edema superimposed on chronic pulmonary fibrosis. Suspect small pleural effusions. Findings suggest early CHF. Aortic Atherosclerosis (ICD10-I70.0) and Emphysema (ICD10-J43.9). Electronically Signed   By: Jerilynn Mages.  Shick M.D.   On: 06/01/2020 10:27    Procedures Procedures  Medications Ordered in the ED Medications  furosemide (LASIX) injection 40 mg (40 mg Intravenous Given 06/01/20 1141)     MDM Rules/Calculators/A&P MDM Patient with increasing SOB, DOE and orthopnea also has LE edema. Will check for PNA, COVID, CHF. Could also be progression of his COPD but no hypoxia in the ED.  ED Course  I have reviewed the triage vital signs and the nursing notes.  Pertinent labs & imaging results that were  available during my care of the patient were reviewed by me and considered in my medical decision making (see chart for details).  Clinical Course as of 06/01/20 1407  Mon Jun 01, 2020  1018 CBC is normal.  [CS]  0037 Patient initially denied any cardiac history but he has a PPM placed for heart block. Last echo was normal EF but cardiology concerned about diastolic HF causing some of his dyspnea.  [CS]  9444 BMP is at baseline.  [CS]  1045 BNP is elevated of unclear significance, no prior available to compare.  [CS]  1053 Covid is neg. Suspect HF as the cause of his symptoms. Will check SpO2 with walking to determine need for admission/supplemental oxygen. [CS]  1112 Patient gets very winded with ambulation, HR and RR both elevated. SpO2 as low as 90% on return to his bed. Will give some Lasix for CHF, presumed diastolic failure and discuss admission with hospitalist.  [CS]  1123 Spoke with Dr. Harvest Forest, Hospitalist, who will admit for further evaluation.  [CS]    Clinical Course User Index [CS] Truddie Hidden, MD    Final Clinical Impression(s) / ED Diagnoses Final diagnoses:  Acute on chronic diastolic congestive heart failure [facility])    Rx / DC Orders ED Discharge Orders    None       Truddie Hidden, MD 06/01/20 1407

## 2020-06-01 NOTE — ED Notes (Signed)
Report given to [facility]. 

## 2020-06-01 NOTE — ED Triage Notes (Signed)
Pt having increased sob since last night.  Does have a cough but not a new issue.  No fever, no N/V/D.  No changes in taste and smell.

## 2020-06-02 ENCOUNTER — Inpatient Hospital Stay (HOSPITAL_COMMUNITY): Payer: Medicare Other

## 2020-06-02 ENCOUNTER — Encounter (HOSPITAL_COMMUNITY)
Admission: EM | Disposition: A | Discharge status: Home / Self Care | Payer: Self-pay | Source: Home / Self Care | Attending: Family Medicine

## 2020-06-02 DIAGNOSIS — N1832 Chronic kidney disease, stage 3b: Secondary | ICD-10-CM | POA: Diagnosis not present

## 2020-06-02 DIAGNOSIS — I509 Heart failure, unspecified: Secondary | ICD-10-CM | POA: Diagnosis not present

## 2020-06-02 DIAGNOSIS — I2510 Atherosclerotic heart disease of native coronary artery without angina pectoris: Secondary | ICD-10-CM | POA: Diagnosis not present

## 2020-06-02 DIAGNOSIS — I130 Hypertensive heart and chronic kidney disease with heart failure and stage 1 through stage 4 chronic kidney disease, or unspecified chronic kidney disease: Secondary | ICD-10-CM | POA: Diagnosis not present

## 2020-06-02 DIAGNOSIS — I441 Atrioventricular block, second degree: Secondary | ICD-10-CM

## 2020-06-02 DIAGNOSIS — I5043 Acute on chronic combined systolic (congestive) and diastolic (congestive) heart failure: Secondary | ICD-10-CM

## 2020-06-02 DIAGNOSIS — Z950 Presence of cardiac pacemaker: Secondary | ICD-10-CM

## 2020-06-02 DIAGNOSIS — I5020 Unspecified systolic (congestive) heart failure: Secondary | ICD-10-CM | POA: Diagnosis not present

## 2020-06-02 DIAGNOSIS — I5033 Acute on chronic diastolic (congestive) heart failure: Secondary | ICD-10-CM | POA: Diagnosis not present

## 2020-06-02 DIAGNOSIS — I10 Essential (primary) hypertension: Secondary | ICD-10-CM

## 2020-06-02 HISTORY — PX: LEFT HEART CATH AND CORONARY ANGIOGRAPHY: CATH118249

## 2020-06-02 LAB — HEMOGLOBIN A1C
Hgb A1c MFr Bld: 6.1 % — ABNORMAL HIGH (ref 4.8–5.6)
Mean Plasma Glucose: 128.37 mg/dL

## 2020-06-02 LAB — BASIC METABOLIC PANEL
Anion gap: 12 (ref 5–15)
BUN: 24 mg/dL — ABNORMAL HIGH (ref 8–23)
CO2: 24 mmol/L (ref 22–32)
Calcium: 8.8 mg/dL — ABNORMAL LOW (ref 8.9–10.3)
Chloride: 100 mmol/L (ref 98–111)
Creatinine, Ser: 1.56 mg/dL — ABNORMAL HIGH (ref 0.61–1.24)
GFR, Estimated: 42 mL/min — ABNORMAL LOW (ref >60)
Glucose, Bld: 167 mg/dL — ABNORMAL HIGH (ref 70–99)
Potassium: 4.6 mmol/L (ref 3.5–5.1)
Sodium: 136 mmol/L (ref 135–145)

## 2020-06-02 LAB — CBC
HCT: 41.5 % (ref 39.0–52.0)
Hemoglobin: 14 g/dL (ref 13.0–17.0)
MCH: 28.9 pg (ref 26.0–34.0)
MCHC: 33.7 g/dL (ref 30.0–36.0)
MCV: 85.6 fL (ref 80.0–100.0)
Platelets: 167 10*3/uL (ref 150–400)
RBC: 4.85 MIL/uL (ref 4.22–5.81)
RDW: 14.3 % (ref 11.5–15.5)
WBC: 4 10*3/uL (ref 4.0–10.5)
nRBC: 0 % (ref 0.0–0.2)

## 2020-06-02 LAB — ECHOCARDIOGRAM COMPLETE
AR max vel: 1.36 cm2
AV Area VTI: 1.22 cm2
AV Area mean vel: 1.3 cm2
AV Mean grad: 8 mmHg
AV Peak grad: 13.4 mmHg
Ao pk vel: 1.83 m/s
Area-P 1/2: 7.02 cm2
Height: 73 in
S' Lateral: 2.9 cm
Weight: 2939.2 oz

## 2020-06-02 LAB — GLUCOSE, CAPILLARY
Glucose-Capillary: 167 mg/dL — ABNORMAL HIGH (ref 70–99)
Glucose-Capillary: 164 mg/dL — ABNORMAL HIGH (ref 70–99)
Glucose-Capillary: 150 mg/dL — ABNORMAL HIGH (ref 70–99)
Glucose-Capillary: 205 mg/dL — ABNORMAL HIGH (ref 70–99)

## 2020-06-02 LAB — MAGNESIUM: Magnesium: 2.1 mg/dL (ref 1.7–2.4)

## 2020-06-02 SURGERY — LEFT HEART CATH AND CORONARY ANGIOGRAPHY
Anesthesia: LOCAL

## 2020-06-02 MED ORDER — SODIUM CHLORIDE 0.9% FLUSH: 3.0000 mL | INTRAVENOUS | Status: DC | PRN

## 2020-06-02 MED ORDER — ASPIRIN 81 MG PO CHEW
81.0000 mg | CHEWABLE_TABLET | ORAL | Status: DC
  Filled 2020-06-02: qty 1

## 2020-06-02 MED ORDER — SODIUM CHLORIDE 0.9 % IV SOLN: INTRAVENOUS | Status: DC

## 2020-06-02 MED ORDER — ACETAMINOPHEN 325 MG PO TABS: 650.0000 mg | ORAL_TABLET | ORAL | Status: DC | PRN

## 2020-06-02 MED ORDER — SODIUM CHLORIDE 0.9 % IV SOLN: 250.0000 mL | INTRAVENOUS | Status: DC | PRN

## 2020-06-02 MED ORDER — ENOXAPARIN SODIUM 40 MG/0.4ML ~~LOC~~ SOLN
40.0000 mg | SUBCUTANEOUS | Status: DC
  Administered 2020-06-03: 40 mg via SUBCUTANEOUS
  Filled 2020-06-02: qty 0.4

## 2020-06-02 MED ORDER — ONDANSETRON HCL 4 MG/2ML IJ SOLN: 4.0000 mg | Freq: Four times a day (QID) | INTRAMUSCULAR | Status: DC | PRN

## 2020-06-02 MED ORDER — HEPARIN (PORCINE) IN NACL 1000-0.9 UT/500ML-% IV SOLN
INTRAVENOUS | Status: DC | PRN
  Administered 2020-06-02 (×2): 500 mL

## 2020-06-02 MED ORDER — IOHEXOL 350 MG/ML SOLN
INTRAVENOUS | Status: DC | PRN
  Administered 2020-06-02: 60 mL

## 2020-06-02 MED ORDER — LIDOCAINE HCL (PF) 1 % IJ SOLN
INTRAMUSCULAR | Status: AC
  Filled 2020-06-02: qty 30

## 2020-06-02 MED ORDER — ASPIRIN 81 MG PO CHEW
81.0000 mg | CHEWABLE_TABLET | Freq: Once | ORAL | Status: AC
  Administered 2020-06-02: 81 mg via ORAL

## 2020-06-02 MED ORDER — SODIUM CHLORIDE 0.9% FLUSH: 3.0000 mL | Freq: Two times a day (BID) | INTRAVENOUS | Status: DC

## 2020-06-02 MED ORDER — IPRATROPIUM-ALBUTEROL 0.5-2.5 (3) MG/3ML IN SOLN
3.0000 mL | Freq: Four times a day (QID) | RESPIRATORY_TRACT | Status: DC
  Administered 2020-06-02 – 2020-06-03 (×3): 3 mL via RESPIRATORY_TRACT
  Filled 2020-06-02 (×3): qty 3

## 2020-06-02 MED ORDER — FENTANYL CITRATE (PF) 100 MCG/2ML IJ SOLN
INTRAMUSCULAR | Status: AC
  Filled 2020-06-02: qty 2

## 2020-06-02 MED ORDER — MIDAZOLAM HCL 2 MG/2ML IJ SOLN
INTRAMUSCULAR | Status: DC | PRN
  Administered 2020-06-02: 1 mg via INTRAVENOUS

## 2020-06-02 MED ORDER — POTASSIUM CHLORIDE CRYS ER 20 MEQ PO TBCR
20.0000 meq | EXTENDED_RELEASE_TABLET | Freq: Two times a day (BID) | ORAL | Status: DC
  Administered 2020-06-02 (×2): 20 meq via ORAL
  Filled 2020-06-02 (×2): qty 1

## 2020-06-02 MED ORDER — VERAPAMIL HCL 2.5 MG/ML IV SOLN
INTRAVENOUS | Status: AC
  Filled 2020-06-02: qty 2

## 2020-06-02 MED ORDER — HEPARIN SODIUM (PORCINE) 1000 UNIT/ML IJ SOLN
INTRAMUSCULAR | Status: DC | PRN
  Administered 2020-06-02: 4000 [IU] via INTRAVENOUS

## 2020-06-02 MED ORDER — VERAPAMIL HCL 2.5 MG/ML IV SOLN
INTRAVENOUS | Status: DC | PRN
  Administered 2020-06-02: 10 mL via INTRA_ARTERIAL

## 2020-06-02 MED ORDER — FUROSEMIDE 10 MG/ML IJ SOLN
40.0000 mg | Freq: Two times a day (BID) | INTRAMUSCULAR | Status: DC
  Administered 2020-06-02: 40 mg via INTRAVENOUS
  Filled 2020-06-02: qty 4

## 2020-06-02 MED ORDER — MIDAZOLAM HCL 2 MG/2ML IJ SOLN
INTRAMUSCULAR | Status: AC
  Filled 2020-06-02: qty 2

## 2020-06-02 MED ORDER — HEPARIN (PORCINE) IN NACL 1000-0.9 UT/500ML-% IV SOLN
INTRAVENOUS | Status: AC
  Filled 2020-06-02: qty 1000

## 2020-06-02 MED ORDER — ADULT MULTIVITAMIN W/MINERALS CH
1.0000 | ORAL_TABLET | Freq: Every day | ORAL | Status: DC
  Administered 2020-06-02 – 2020-06-03 (×2): 1 via ORAL
  Filled 2020-06-02 (×2): qty 1

## 2020-06-02 MED ORDER — LABETALOL HCL 5 MG/ML IV SOLN: 10.0000 mg | INTRAVENOUS | Status: AC | PRN

## 2020-06-02 MED ORDER — HEPARIN SODIUM (PORCINE) 1000 UNIT/ML IJ SOLN
INTRAMUSCULAR | Status: AC
  Filled 2020-06-02: qty 1

## 2020-06-02 MED ORDER — LIDOCAINE HCL (PF) 1 % IJ SOLN
INTRAMUSCULAR | Status: DC | PRN
  Administered 2020-06-02: 2 mL

## 2020-06-02 MED ORDER — FENTANYL CITRATE (PF) 100 MCG/2ML IJ SOLN
INTRAMUSCULAR | Status: DC | PRN
  Administered 2020-06-02: 25 ug via INTRAVENOUS

## 2020-06-02 MED ORDER — HYDRALAZINE HCL 20 MG/ML IJ SOLN: 10.0000 mg | INTRAMUSCULAR | Status: AC | PRN

## 2020-06-02 SURGICAL SUPPLY — 10 items
CATH 5FR JL3.5 JR4 ANG PIG MP (CATHETERS) ×1 IMPLANT
DEVICE RAD COMP TR BAND LRG (VASCULAR PRODUCTS) ×1 IMPLANT
GLIDESHEATH SLEND SS 6F .021 (SHEATH) ×1 IMPLANT
GUIDEWIRE INQWIRE 1.5J.035X260 (WIRE) IMPLANT
INQWIRE 1.5J .035X260CM (WIRE) ×2
KIT HEART LEFT (KITS) ×2 IMPLANT
PACK CARDIAC CATHETERIZATION (CUSTOM PROCEDURE TRAY) ×2 IMPLANT
TRANSDUCER W/STOPCOCK (MISCELLANEOUS) ×2 IMPLANT
TUBING CIL FLEX 10 FLL-RA (TUBING) ×2 IMPLANT
WIRE HI TORQ VERSACORE-J 145CM (WIRE) ×1 IMPLANT

## 2020-06-02 NOTE — Progress Notes (Signed)
  Echocardiogram 2D Echocardiogram has been performed.  Daniel Woodard 06/02/2020, 9:38 AM

## 2020-06-02 NOTE — Interval H&P Note (Signed)
Cath Lab Visit (complete for each Cath Lab visit)  Clinical Evaluation Leading to the Procedure:   ACS: Yes.    Non-ACS:    Anginal Classification: CCS IV  Anti-ischemic medical therapy: Minimal Therapy (1 class of medications)  Non-Invasive Test Results: No non-invasive testing performed  Prior CABG: No previous CABG   Low EF.  No angina so would only treat a high risk lesion.    History and Physical Interval Note:  06/02/2020 2:53 PM  Daniel Woodard  has presented today for surgery, with the diagnosis of heart failure.  The various methods of treatment have been discussed with the patient and family. After consideration of risks, benefits and other options for treatment, the patient has consented to  Procedure(s): LEFT HEART CATH AND CORONARY ANGIOGRAPHY (N/A) as a surgical intervention.  The patient's history has been reviewed, patient examined, no change in status, stable for surgery.  I have reviewed the patient's chart and labs.  Questions were answered to the patient's satisfaction.     Lance Muss

## 2020-06-02 NOTE — Plan of Care (Signed)
  Problem: Activity: Goal: Capacity to carry out activities will improve Outcome: Progressing   Problem: Cardiac: Goal: Ability to achieve and maintain adequate cardiopulmonary perfusion will improve Outcome: Progressing   

## 2020-06-02 NOTE — Progress Notes (Signed)
Initial Nutrition Assessment  DOCUMENTATION CODES:   Not applicable  INTERVENTION:   -Magic cup TID with meals, each supplement provides 290 kcal and 9 grams of protein -MVI with minerals daily  NUTRITION DIAGNOSIS:   Increased nutrient needs related to chronic illness (CHF) as evidenced by estimated needs.  GOAL:   Patient will meet greater than or equal to 90% of their needs  MONITOR:   PO intake,Supplement acceptance,Labs,Weight trends,Skin,I & O's  REASON FOR ASSESSMENT:   Consult Assessment of nutrition requirement/status  ASSESSMENT:   Daniel Woodard is a 87 y.o. male with medical history significant of atrial fibrillation, complete heart block s/p pacemaker, hypertension, hyperlipidemia, COPD, and diabetes mellitus type 2 presents with complaints of progressively worsening shortness of breath over the last week.  Reports that he has been having a difficult time walking any distance without feeling out of breath.  He chronically has a cough, but reports that he may have been coughing more.  Denies having any fever, lower extremity swelling, chest pain, nausea, vomiting, diarrhea, or change in weight.  Patient reports that his weight stays around 185 to 190 pounds.  At night symptoms seem to worsen at night to the point which she was unable to sleep for feeling as though he was going to stop breathing.  Reported associated symptoms of wheezing.  He has received all 3 of his COVID-19 vaccinations and also been vaccinated against flu.  Patient had been using his inhaler more frequently and had been prescribed every 6 hours without any relief in symptoms.  He has never had a COPD exacerbation requiring steroids or hospitalization before in the past.  Pt admitted with CHF and COPD exacerbation.   Reviewed I/O's: -2.7 L x 24 hours  UOP: 2.9 L x 24 hours  Spoke with pt, who reports feeling better today. He reports a good appetite both currently and PTA. At baseline, he consumes  2 meals per day (Breakfast: cereal and egg; Dinner: meat, starch, and vegetable). Pt reports that he drinks mostly water and will occasionally snacks on crackers throughout the day. His daughter provide him with homemade meals throughout the week.   Per pt, he consumed almost all of his breakfast (noted he cosnumed about 85% per observation of tray). He is looking forward to his tuna sandwich for lunch.  Pt denies any weight loss. He reports UBE of 180-185#. Reviewed wt hx; pt has experienced  5.3% wt loss over the past 6 months, which is not significant for time frame.   Discussed importance of good meal and supplement intake to promote healing.   Case discussed with RN, who reports potential for discharge today.   Medications reviewed and include IV solumedrol.  Lab Results  Component Value Date   HGBA1C 6.1 (H) 06/02/2020   PTA DM medications are none.   Labs reviewed: CBGS: 108-167 (inpatient orders for glycemic control are 0-9 units insulin aspart TID with meals).   NUTRITION - FOCUSED PHYSICAL EXAM:  Flowsheet Row Most Recent Value  Orbital Region No depletion  Upper Arm Region Mild depletion  Thoracic and Lumbar Region No depletion  Buccal Region No depletion  Temple Region Moderate depletion  Clavicle Bone Region Mild depletion  Clavicle and Acromion Bone Region Mild depletion  Scapular Bone Region Mild depletion  Dorsal Hand Mild depletion  Patellar Region Mild depletion  Anterior Thigh Region Mild depletion  Posterior Calf Region Mild depletion  Edema (RD Assessment) Mild  Hair Reviewed  Eyes Reviewed  Mouth Reviewed  Skin Reviewed  Nails Reviewed       Diet Order:   Diet Order            Diet Heart Room service appropriate? Yes; Fluid consistency: Thin  Diet effective now                 EDUCATION NEEDS:   Education needs have been addressed  Skin:  Skin Assessment: Reviewed RN Assessment  Last BM:  05/31/20  Height:   Ht Readings from Last 1  Encounters:  06/01/20 6\' 1"  (1.854 m)    Weight:   Wt Readings from Last 1 Encounters:  06/02/20 83.3 kg    Ideal Body Weight:  83.6 kg  BMI:  Body mass index is 24.24 kg/m.  Estimated Nutritional Needs:   Kcal:  2050-2250  Protein:  100-115 grams  Fluid:  > 2 L    12-30-1978, RD, LDN, CDCES Registered Dietitian II Certified Diabetes Care and Education Specialist Please refer to [facility] for RD and/or RD on-call/weekend/after hours pager

## 2020-06-02 NOTE — Progress Notes (Signed)
  ReDS Clip Diuretic Study Pt study # [id number]  Your patient has been enrolled in the ReDS Clip Diuretic Study  87 yo M with worsening shortness of breath, orthopnea, DOE. BNP elevated to 971.2. SCr 1.47>1.56, weight down from 186 to 183 lbs. Receiving IV lasix 40 mg x 1 yesterday. Net -2.7L uop. IV lasix 40 mg BID ordered currently.   Changes to prescribed diuretics recommended:  Stop IV lasix  Provider contacted: Angie Duke Recommendation was accepted by provider.    REDS Clip  READING= 24%  CHEST RULER = 36 Clip Station = D   Orthodema score = 0 Signs/Symptoms Score   Mild edema, no orthopnea 0 No congestion  Moderate edema, no orthopnea 1 Low-grade orthodema/congestion  Severe edema OR orthopnea 2   Moderate edema and orthopnea 3 High-grade orthodema/congestion  Severe edema AND orthopnea 4    Sharen Hones, PharmD, BCPS Heart Failure Stewardship Pharmacist Phone [phone]  Please check [email or web address] for unit-specific pharmacist phone numbers

## 2020-06-02 NOTE — Evaluation (Signed)
Occupational Therapy Evaluation Patient Details Name: Daniel Woodard MRN: [id number] DOB: 1930-03-09 Today's Date: 06/02/2020    History of Present Illness Daniel Woodard is a 87 y.o. male with medical history significant of atrial fibrillation, complete heart block s/p pacemaker, hypertension, hyperlipidemia, COPD, and diabetes mellitus type 2 presents with complaints of progressively worsening shortness of breath over the last week. Admitted with diastolic congestive heart failure exacerbation and COPD exacerbation.   Clinical Impression   Pt is Mod I - Ind with mobility using no AD and with ADLs/selfcare. No further acute OT services are indicated at this time. OT wil sign off    Follow Up Recommendations  No OT follow up    Equipment Recommendations  None recommended by OT    Recommendations for Other Services       Precautions / Restrictions Precautions Precautions: None Restrictions Weight Bearing Restrictions: No      Mobility Bed Mobility               General bed mobility comments: pt in recliner upon arriva    Transfers Overall transfer level: Independent Equipment used: None                  Balance Overall balance assessment: Mild deficits observed, not formally tested                                         ADL either performed or assessed with clinical judgement   ADL Overall ADL's : Modified independent;Independent                                             Vision Baseline Vision/History: Wears glasses Patient Visual Report: No change from baseline       Perception     Praxis      Pertinent Vitals/Pain Pain Assessment: No/denies pain     Hand Dominance Right   Extremity/Trunk Assessment Upper Extremity Assessment Upper Extremity Assessment: Overall WFL for tasks assessed   Lower Extremity Assessment Lower Extremity Assessment: Defer to PT evaluation   Cervical / Trunk  Assessment Cervical / Trunk Assessment: Normal   Communication Communication Communication: No difficulties   Cognition Arousal/Alertness: Awake/alert Behavior During Therapy: WFL for tasks assessed/performed Overall Cognitive Status: Within Functional Limits for tasks assessed                                     General Comments       Exercises     Shoulder Instructions      Home Living Family/patient expects to be discharged to:: Private residence Living Arrangements: Alone Available Help at Discharge: Family Type of Home: Other(Comment) Home Access: Level entry     Home Layout: One level     Bathroom Shower/Tub: Tub/shower unit;Walk-in shower   Bathroom Toilet: Handicapped height     Home Equipment: Environmental consultant - 2 wheels;Cane - single point;Wheelchair - [employer] - 4 wheels          Prior Functioning/Environment Level of Independence: Independent        Comments: Reports difficulty with ambulating longer distances intermittently        OT Problem List: Decreased activity tolerance  OT Treatment/Interventions:      OT Goals(Current goals can be found in the care plan section) Acute Rehab OT Goals Patient Stated Goal: improved breathing with activity  OT Frequency:     Barriers to D/C:            Co-evaluation              AM-PAC OT "6 Clicks" Daily Activity     Outcome Measure Help from another person eating meals?: None Help from another person taking care of personal grooming?: None Help from another person toileting, which includes using toliet, bedpan, or urinal?: None Help from another person bathing (including washing, rinsing, drying)?: None Help from another person to put on and taking off regular upper body clothing?: None Help from another person to put on and taking off regular lower body clothing?: None 6 Click Score: 24   End of Session    Activity Tolerance: Patient tolerated treatment well Patient  left: in chair  OT Visit Diagnosis: Other abnormalities of gait and mobility (R26.89)                Time: [id number] OT Time Calculation (min): 21 min Charges:  OT General Charges $OT Visit: 1 Visit OT Evaluation $OT Eval Moderate Complexity: 1 Mod    Britt Bottom 06/02/2020, 12:49 PM

## 2020-06-02 NOTE — Plan of Care (Signed)
  Problem: Activity: Goal: Risk for activity intolerance will decrease Outcome: Progressing   

## 2020-06-02 NOTE — Progress Notes (Signed)
[facility] PROGRESS NOTE    Daniel Woodard  [id number] DOB: 12-03-1929 DOA: 06/01/2020 PCP: Biagio Borg, MD      Brief Narrative:  [name] is a 87 y.o. M with Afib, mobitz 2 HB s/p PPM, PVD s/p CEA and chronic L subclavian occl, HTN, COPD, and DM who presented with 1 week progressive SOB with exertion.    This developed now in the last week to where he can't walk anywhere without exertional intolerance and he has orthopnea.  In the ER, BNP elevated, CXR showed cardiomegaly and bibasilar edema.  Given IV Lasix and admitted.       Assessment & Plan:  Acute on chronic diastolic CHF Diuresed 5lbs 2.6L overnight.  Feeling better, still out of breath with exertion, but able to ambulate down the hall. -Cnotinue Furosemide 40 mg IV twice a day  -K supplement -Strict I/Os, daily weights, telemetry  -Daily monitoring renal function    ADDENDUM:  Acute on chronic systolic and diastolic CHF Echo showed new reduced EF.  Taken to Cath lab that showed moderate disease diffusely.  Cardiology suspect this is assychrony and he may need a BiV. -Monitor Cr overnight after contast iodine, if stable tomorrow, then home   Atrial fibrillation, permanent -Continue aspirin -Continue metoprolol  COPD exacerbation Wheezing is resolved, breathing feels better -Transition to prednisone for 5-day course -Ceftriaxone, discharge on amoxicillin   CKD stage IIIb Creatinine stable at Baseline   Subclinical hypothyroidism TSH 5.6  Hypertension Coronary disease secondary prevention Blood pressure normalized this morning with Lasix -Continue metoprolol -Continue Crestor       Disposition: Status is: Inpatient  Remains inpatient appropriate because:Ongoing diagnostic testing needed not appropriate for outpatient work up   Dispo: The patient is from: Home              Anticipated d/c is to: Home              Anticipated d/c date is: 1 day              Patient  currently is not medically stable to d/c.              MDM: The below labs and imaging reports were reviewed and summarized above.  Medication management as above.    DVT prophylaxis: enoxaparin (LOVENOX) injection 40 mg Start: 06/03/20 0800  Code Status: FULL Family Communication:          Subjective: Patient still has some dyspnea with exertion, less orthopnea, cough, and wheezing are improved.  No swelling.  No headache, confusion, fever.  Objective: Vitals:   06/02/20 1526 06/02/20 1531 06/02/20 1553 06/02/20 1647  BP: (!) 150/125 (!) 120/49 98/62 (!) 135/99  Pulse: 82 87    Resp: 12 15    Temp:      TempSrc:      SpO2: 92% 94%    Weight:      Height:        Intake/Output Summary (Last 24 hours) at 06/02/2020 1716 Last data filed at 06/02/2020 1238 Gross per 24 hour  Intake 617.8 ml  Output 900 ml  Net -282.2 ml   Filed Weights   06/01/20 0927 06/01/20 1549 06/02/20 0652  Weight: 85.3 kg 84.5 kg 83.3 kg    Examination: General appearance:  adult male, alert and in no acute distress.  Sitting in recliner HEENT: Anicteric, conjunctiva pink, lids and lashes normal. No nasal deformity, discharge, epistaxis.   Skin: Warm and dry.  no jaundice.  No suspicious rashes or lesions. Cardiac: Slow, irregular, soft systolic murmur, no lower extremity edema  Respiratory: Normal respiratory rate and rhythm.  CTAB without rales or wheezes. Abdomen: Abdomen soft.  No TTP or guarding. No ascites, distension, hepatosplenomegaly.   MSK: No deformities or effusions. Neuro: Awake and alert.  EOMI, moves all extremities. Speech fluent.    Psych: Sensorium intact and responding to questions, attention normal. Affect normal.  Judgment and insight appear normal.    Data Reviewed: I have personally reviewed following labs and imaging studies:  CBC: Recent Labs  Lab 06/01/20 1002 06/02/20 0605  WBC 7.1 4.0  NEUTROABS 5.5  --   HGB 14.2 14.0  HCT 44.3 41.5  MCV  88.4 85.6  PLT 169 [id number]   Basic Metabolic Panel: Recent Labs  Lab 06/01/20 1002 06/02/20 0605  NA 136 136  K 4.4 4.6  CL 105 100  CO2 22 24  GLUCOSE 123* 167*  BUN 22 24*  CREATININE 1.47* 1.56*  CALCIUM 8.6* 8.8*  MG  --  2.1   GFR: Estimated Creatinine Clearance: 37 mL/min (A) (by C-G formula based on SCr of 1.56 mg/dL (H)). Liver Function Tests: No results for input(s): AST, ALT, ALKPHOS, BILITOT, PROT, ALBUMIN in the last 168 hours. No results for input(s): LIPASE, AMYLASE in the last 168 hours. No results for input(s): AMMONIA in the last 168 hours. Coagulation Profile: No results for input(s): INR, PROTIME in the last 168 hours. Cardiac Enzymes: No results for input(s): CKTOTAL, CKMB, CKMBINDEX, TROPONINI in the last 168 hours. BNP (last 3 results) No results for input(s): PROBNP in the last 8760 hours. HbA1C: Recent Labs    06/02/20 0605  HGBA1C 6.1*   CBG: Recent Labs  Lab 06/01/20 1649 06/01/20 2133 06/02/20 0648 06/02/20 1145 06/02/20 1703  GLUCAP 108* 153* 167* 164* 150*   Lipid Profile: No results for input(s): CHOL, HDL, LDLCALC, TRIG, CHOLHDL, LDLDIRECT in the last 72 hours. Thyroid Function Tests: Recent Labs    06/01/20 1651  TSH 5.609*   Anemia Panel: No results for input(s): VITAMINB12, FOLATE, FERRITIN, TIBC, IRON, RETICCTPCT in the last 72 hours. Urine analysis:    Component Value Date/Time   COLORURINE YELLOW 11/20/2019 1107   APPEARANCEUR CLEAR 11/20/2019 1107   LABSPEC >=1.030 (A) 11/20/2019 1107   PHURINE 5.0 11/20/2019 1107   GLUCOSEU NEGATIVE 11/20/2019 1107   HGBUR NEGATIVE 11/20/2019 1107   BILIRUBINUR SMALL (A) 11/20/2019 1107   KETONESUR NEGATIVE 11/20/2019 1107   PROTEINUR NEGATIVE 03/06/2019 1148   UROBILINOGEN 0.2 11/20/2019 1107   NITRITE NEGATIVE 11/20/2019 1107   LEUKOCYTESUR NEGATIVE 11/20/2019 1107   Sepsis Labs: @LABRCNTIP (procalcitonin:4,lacticacidven:4)  ) Recent Results (from the past 240 hour(s))   Resp Panel by RT-PCR (Flu A&B, Covid) Nasopharyngeal Swab     Status: None   Collection Time: 06/01/20 10:02 AM   Specimen: Nasopharyngeal Swab; Nasopharyngeal(NP) swabs in vial transport medium  Result Value Ref Range Status   SARS Coronavirus 2 by RT PCR NEGATIVE NEGATIVE Final    Comment: (NOTE) SARS-CoV-2 target nucleic acids are NOT DETECTED.  The SARS-CoV-2 RNA is generally detectable in upper respiratory specimens during the acute phase of infection. The lowest concentration of SARS-CoV-2 viral copies this assay can detect is 138 copies/mL. A negative result does not preclude SARS-Cov-2 infection and should not be used as the sole basis for treatment or other patient management decisions. A negative result may occur with  improper specimen collection/handling, submission of specimen other than nasopharyngeal  swab, presence of viral mutation(s) within the areas targeted by this assay, and inadequate number of viral copies(<138 copies/mL). A negative result must be combined with clinical observations, patient history, and epidemiological information. The expected result is Negative.  Fact Sheet for Patients:  EntrepreneurPulse.com.au  Fact Sheet for Healthcare Providers:  IncredibleEmployment.be  This test is no t yet approved or cleared by the [place] FDA and  has been authorized for detection and/or diagnosis of SARS-CoV-2 by FDA under an Emergency Use Authorization (EUA). This EUA will remain  in effect (meaning this test can be used) for the duration of the COVID-19 declaration under Section 564(b)(1) of the Act, 21 U.S.C.section 360bbb-3(b)(1), unless the authorization is terminated  or revoked sooner.       Influenza A by PCR NEGATIVE NEGATIVE Final   Influenza B by PCR NEGATIVE NEGATIVE Final    Comment: (NOTE) The Xpert Xpress SARS-CoV-2/FLU/RSV plus assay is intended as an aid in the diagnosis of influenza from  Nasopharyngeal swab specimens and should not be used as a sole basis for treatment. Nasal washings and aspirates are unacceptable for Xpert Xpress SARS-CoV-2/FLU/RSV testing.  Fact Sheet for Patients: EntrepreneurPulse.com.au  Fact Sheet for Healthcare Providers: IncredibleEmployment.be  This test is not yet approved or cleared by the [place] FDA and has been authorized for detection and/or diagnosis of SARS-CoV-2 by FDA under an Emergency Use Authorization (EUA). This EUA will remain in effect (meaning this test can be used) for the duration of the COVID-19 declaration under Section 564(b)(1) of the Act, 21 U.S.C. section 360bbb-3(b)(1), unless the authorization is terminated or revoked.  Performed at [facility], [address] [place], [place] [address]          Radiology Studies: CARDIAC CATHETERIZATION  Result Date: 06/02/2020  Prox Cx lesion is 25% stenosed.  Mid LAD lesion is 50-60% stenosed. Heavily calcified at the origin of the diagonal.  2nd Diag lesion is 50% stenosed.  LV end diastolic pressure is normal.  There is no aortic valve stenosis.  Medical therapy given lack of angina.  Degree of LAD disease should not cause akinesis of apex.   DG Chest Port 1 View  Result Date: 06/01/2020 CLINICAL DATA:  Increased shortness of breath, cough, COPD, hypertension EXAM: PORTABLE CHEST 1 VIEW COMPARISON:  07/17/2019, 07/05/2019 FINDINGS: Chronic hyperinflation compatible with known background COPD/emphysema. Upper lobes remain clear. Heart is enlarged with increased bibasilar interstitial opacities concerning for known chronic basilar fibrosis and possibly mild superimposed basilar edema. Difficult to exclude small pleural effusions. Two lead left subclavian pacemaker as before. Trachea midline. No pneumothorax. Aorta atherosclerotic. IMPRESSION: Cardiomegaly with increased basilar interstitial opacities suggesting  component of basilar edema superimposed on chronic pulmonary fibrosis. Suspect small pleural effusions. Findings suggest early CHF. Aortic Atherosclerosis (ICD10-I70.0) and Emphysema (ICD10-J43.9). Electronically Signed   By: Jerilynn Mages.  Shick M.D.   On: 06/01/2020 10:27   ECHOCARDIOGRAM COMPLETE  Result Date: 06/02/2020    ECHOCARDIOGRAM REPORT   Patient Name:   HAOCHEN HECHT Date of Exam: 06/02/2020 Medical Rec #:  [id number]       Height:       73.0 in Accession #:    [id number]      Weight:       183.7 lb Date of Birth:  [birth year]       BSA:          2.075 m Patient Age:    3 years        BP:  159/80 mmHg Patient Gender: M               HR:           94 bpm. Exam Location:  Inpatient Procedure: 2D Echo, Cardiac Doppler and Color Doppler Indications:    CHF  History:        Patient has prior history of Echocardiogram examinations, most                 recent 03/07/2019. Pacemaker, COPD, Arrythmias:Atrial                 Fibrillation, Signs/Symptoms:Shortness of Breath; Risk                 Factors:Hypertension, Diabetes and Dyslipidemia.  Sonographer:    Dustin Flock Referring Phys: [id number] RONDELL A SMITH  Sonographer Comments: Technically difficult study due to poor echo windows. Image acquisition challenging due to COPD and Image acquisition challenging due to respiratory motion. IMPRESSIONS  1. LV function is moderately reduced ~30-35%. The mid to distal seputm into apex appears akinetic with global hypokinesis in other segments. The RWMA may be pacer related but are a bit more than expected, and cannot exclude LAD infarction.  2. Left ventricular ejection fraction, by estimation, is 30 to 35%. The left ventricle has moderately decreased function. The left ventricle demonstrates regional wall motion abnormalities (see scoring diagram/findings for description). There is mild concentric left ventricular hypertrophy. Left ventricular diastolic function could not be evaluated.  3. Right ventricular  systolic function is mildly reduced. The right ventricular size is moderately enlarged. There is moderately elevated pulmonary artery systolic pressure. The estimated right ventricular systolic pressure is [id number] mmHg.  4. There appears to be mild to moderate calcific mitral stenosis, but doppler evaluation across the valve was not performed and severity cannot be adequately assessed. Noted to be at least mild on last study. The mitral valve is degenerative. Mild to moderate mitral valve regurgitation. Severe mitral annular calcification.  5. Mild to moderate AS is present. V max 1.8 m/s, MG 8 mmHG, AVA 1.22 cm2, DI 0.39. SV index low due to cardiomyopathy (23 cc/m2). Visually there is at least moderate AS and AVA/DI support this. The aortic valve is calcified. Aortic valve regurgitation is not visualized. Mild to moderate aortic valve stenosis.  6. The inferior vena cava is dilated in size with <50% respiratory variability, suggesting right atrial pressure of 15 mmHg. Comparison(s): Changes from prior study are noted. EF now 30-35% with RWMA, mild to moderate AS present. RVSP now 64 mmHG. FINDINGS  Left Ventricle: Left ventricular ejection fraction, by estimation, is 30 to 35%. The left ventricle has moderately decreased function. The left ventricle demonstrates regional wall motion abnormalities. The left ventricular internal cavity size was normal in size. There is mild concentric left ventricular hypertrophy. Left ventricular diastolic function could not be evaluated due to mitral annular calcification (moderate or greater). Left ventricular diastolic function could not be evaluated.  LV Wall Scoring: The mid and distal anterior septum and apex are akinetic. Right Ventricle: The right ventricular size is moderately enlarged. No increase in right ventricular wall thickness. Right ventricular systolic function is mildly reduced. There is moderately elevated pulmonary artery systolic pressure. The tricuspid  regurgitant velocity is 3.50 m/s, and with an assumed right atrial pressure of 15 mmHg, the estimated right ventricular systolic pressure is [id number] mmHg. Left Atrium: Left atrial size was normal in size. Right Atrium: Right atrial size was normal in size. Pericardium: Trivial  pericardial effusion is present. Mitral Valve: There appears to be mild to moderate calcific mitral stenosis, but doppler evaluation across the valve was not performed and severity cannot be adequately assessed. Noted to be at least mild on last study. The mitral valve is degenerative in appearance. Severe mitral annular calcification. Mild to moderate mitral valve regurgitation. Tricuspid Valve: The tricuspid valve is grossly normal. Tricuspid valve regurgitation is mild . No evidence of tricuspid stenosis. Aortic Valve: Mild to moderate AS is present. V max 1.8 m/s, MG 8 mmHG, AVA 1.22 cm2, DI 0.39. SV index low due to cardiomyopathy (23 cc/m2). Visually there is at least moderate AS and AVA/DI support this. The aortic valve is calcified. Aortic valve regurgitation is not visualized. Mild to moderate aortic stenosis is present. Aortic valve mean gradient measures 8.0 mmHg. Aortic valve peak gradient measures 13.4 mmHg. Aortic valve area, by VTI measures 1.22 cm. Pulmonic Valve: The pulmonic valve was grossly normal. Pulmonic valve regurgitation is trivial. No evidence of pulmonic stenosis. Aorta: The aortic root is normal in size and structure. Venous: The inferior vena cava is dilated in size with less than 50% respiratory variability, suggesting right atrial pressure of 15 mmHg. IAS/Shunts: The atrial septum is grossly normal. Additional Comments: A pacer wire is visualized in the right atrium and right ventricle.  LEFT VENTRICLE PLAX 2D LVIDd:         3.90 cm  Diastology LVIDs:         2.90 cm  LV e' medial:    4.24 cm/s LV PW:         1.30 cm  LV E/e' medial:  30.4 LV IVS:        1.20 cm  LV e' lateral:   5.22 cm/s LVOT diam:     2.00 cm   LV E/e' lateral: 24.7 LV SV:         49 LV SV Index:   23 LVOT Area:     3.14 cm  RIGHT VENTRICLE RV Basal diam:  3.10 cm RV S prime:     10.80 cm/s TAPSE (M-mode): 2.4 cm LEFT ATRIUM             Index       RIGHT ATRIUM           Index LA diam:        4.40 cm 2.12 cm/m  RA Area:     19.40 cm LA Vol (A2C):   68.6 ml 33.06 ml/m RA Volume:   53.10 ml  25.59 ml/m LA Vol (A4C):   67.2 ml 32.38 ml/m LA Biplane Vol: 75.6 ml 36.43 ml/m  AORTIC VALVE AV Area (Vmax):    1.36 cm AV Area (Vmean):   1.30 cm AV Area (VTI):     1.22 cm AV Vmax:           183.33 cm/s AV Vmean:          122.000 cm/s AV VTI:            0.398 m AV Peak Grad:      13.4 mmHg AV Mean Grad:      8.0 mmHg LVOT Vmax:         79.30 cm/s LVOT Vmean:        50.300 cm/s LVOT VTI:          0.155 m LVOT/AV VTI ratio: 0.39  AORTA Ao Root diam: 2.00 cm MITRAL VALVE  TRICUSPID VALVE MV Area (PHT): 7.02 cm     TR Peak grad:   49.0 mmHg MV Decel Time: 108 msec     TR Vmax:        350.00 cm/s MV E velocity: 129.00 cm/s MV A velocity: 113.00 cm/s  SHUNTS MV E/A ratio:  1.14         Systemic VTI:  0.16 m                             Systemic Diam: 2.00 cm Eleonore Chiquito MD Electronically signed by Eleonore Chiquito MD Signature Date/Time: 06/02/2020/11:53:55 AM    Final         Scheduled Meds: . Derrill Memo ON 06/03/2020] aspirin  325 mg Oral QODAY  . [START ON 06/03/2020] enoxaparin (LOVENOX) injection  40 mg Subcutaneous Q24H  . insulin aspart  0-9 Units Subcutaneous TID WC  . ipratropium-albuterol  3 mL Nebulization Q6H WA  . metoprolol succinate  12.5 mg Oral Daily  . multivitamin with minerals  1 tablet Oral Daily  . pantoprazole  40 mg Oral Daily  . potassium chloride  20 mEq Oral BID  . [START ON 06/03/2020] predniSONE  40 mg Oral Q breakfast  . rosuvastatin  20 mg Oral Once per day on Sun Mon Wed Thu  . sodium chloride flush  3 mL Intravenous Q12H  . sodium chloride flush  3 mL Intravenous Q12H  . sodium chloride flush  3 mL Intravenous  Q12H   Continuous Infusions: . sodium chloride 250 mL (06/01/20 1816)  . sodium chloride 125 mL/hr at 06/02/20 1329  . sodium chloride    . cefTRIAXone (ROCEPHIN)  IV 1 g (06/01/20 1819)     LOS: 1 day    Time spent: 25 minutes    Edwin Dada, MD [facility] 06/02/2020, 5:16 PM     Please page though [place] or Epic secure chat:  For [employer], Adult nurse

## 2020-06-02 NOTE — H&P (View-Only) (Signed)
Cardiology Consultation:   Patient ID: Daniel Woodard MRN: [id number]; DOB: 11/08/1929  Admit date: 06/01/2020 Date of Consult: 06/02/2020  Primary Care Provider: Biagio Borg, MD [facility] Cardiologist: Sanda Klein, MD - new [facility] Electrophysiologist:  Cristopher Peru, MD    Patient Profile:   Daniel Woodard is a 87 y.o. male with a hx of HTN, DM2, COPD, HLD, CKD stage III, 2:1 AV block s/p PPM insertion in 2015, carotid artery stenosis s/p left CEA (2001), and known chronic left subclavian artery occlusion who is being seen today for the evaluation of CHF at the request of Dr. Loleta Books.  History of Present Illness:   Daniel Woodard is known to this service with history of mobitz 2 HB and PPM in place (DDD). He is maintained on 325 mg ASA with known left subclavian occlusion and  Left CEA. He does not have a history of CAD, but Dr. Lovena Le suspects a component of diastolic heart failure and has historically provided PRN lasix. He was last seen by Dr. Lovena Le 07/23/19 and was doing well at that time. He follows with pulmonary for COPD and has chronic cough.  He presented to [facility] with complaints of worsening shortness of breath, DOE, orthopnea, and LE edema. He was admitted with concern for congestive heart failure. He reports dry weight of 185-190 lbs and denies weight gain.   ER workup: TSH 5.609 BNP 971 sCr 1.47 --> 1.56 A1c 6.1%  Cardiology was consulted for suspected heart failure. Echocardiogram has been completed.    He reports worsening DOE over the preceding 3-4 days prior to admission. He at first denies orthopnea, but does report increasing the number of pillows he sleeps with over the past 3-4 days. He lives at home and has help with meals from his daughters. He does not eat excessive sodium. He can complete ADLs. He reports significant improvement in symptoms today and was able to walk in the hall without dyspnea today. He denies chest pain, palpitations, and syncope.    REDS Clip reading today was 24%.   Past Medical History:  Diagnosis Date  . ALLERGIC RHINITIS 04/09/2007  . Atrial fibrillation ([place])   . BACTEREMIA, 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  . BENIGN PROSTATIC HYPERTROPHY 04/09/2007  . Blood transfusion without reported diagnosis    really not sure thinks 30-40 years ago  . BRADYCARDIA 05/11/2009  . Carotid artery occlusion   . COLONIC POLYPS, HX OF 04/09/2007   ADENOMATOUS POLYPS [id number],3007  . Complete heart block ([place])   . COPD 04/09/2007  . Cough 03/27/2008  . DEPRESSION 04/09/2007  . DIABETES MELLITUS, TYPE II 10/09/2007  . DIVERTICULOSIS, COLON 04/09/2007  . DIZZINESS 10/06/2008  . GLUCOSE INTOLERANCE 04/09/2007  . HYPERLIPIDEMIA 03/27/2007  . HYPERTENSION 03/27/2007  . PERIPHERAL VASCULAR DISEASE 03/27/2007  . PNEUMONIA ORGANISM NOS 11/08/2007  . SHINGLES, HX OF 04/09/2007  . SHOULDER PAIN, BILATERAL 08/01/2007  . Ulcer    40 years ago    Past Surgical History:  Procedure Laterality Date  . APPENDECTOMY     87 years old  . CAROTID ENDARTERECTOMY  5/08   left  . CARPAL TUNNEL RELEASE     bilateral  . COLONOSCOPY    . ESOPHAGOGASTRODUODENOSCOPY  06/07/2004  . HEMORRHOID BANDING  1990s  . PACEMAKER INSERTION  07/11/2013   MDT ADDRL1 pacemaker implanted by Dr Lovena Le for complete heart block  . PERMANENT PACEMAKER INSERTION N/A 07/11/2013   Procedure: PERMANENT PACEMAKER INSERTION;  Surgeon: Evans Lance, MD;  Location:  [facility];  Service: Cardiovascular;  Laterality: N/A;  . s/p PUD surgury     ? partial gastrectomy     Home Medications:  Prior to Admission medications   Medication Sig Start Date End Date Taking? Authorizing Provider  albuterol (VENTOLIN HFA) 108 (90 Base) MCG/ACT inhaler Inhale 2 puffs into the lungs every 6 (six) hours as needed for wheezing or shortness of breath. 04/03/20  Yes Lauraine Rinne, NP  aspirin 325 MG tablet Take 1 tablet (325 mg total) by mouth daily. Patient taking differently:  Take 325 mg by mouth every other day. 03/08/19  Yes Geradine Girt, DO  Coenzyme Q10 (COQ-10) 200 MG CAPS Take 200 mg by mouth daily after lunch.   Yes [provider]  metoprolol succinate (TOPROL-XL) 25 MG 24 hr tablet Take 1/2 (one-half) tablet by mouth once daily Patient taking differently: Take 12.5 mg by mouth daily. 05/19/20  Yes Biagio Borg, MD  omeprazole (PRILOSEC) 20 MG capsule TAKE 1 CAPSULE BY MOUTH ONCE DAILY Patient taking differently: Take 20 mg by mouth daily. 11/20/19  Yes Biagio Borg, MD  Psyllium (METAMUCIL PO) Take 2 capsules by mouth daily.   Yes [provider]  rosuvastatin (CRESTOR) 20 MG tablet Take 2 tablets (40 mg total) by mouth every other day. Patient taking differently: Take 20 mg by mouth See admin instructions. Take one tablet (20 mg) by mouth daily at bedtime for 2 nights, skip 3rd night, then repeat. 02/26/20  Yes Biagio Borg, MD  VITAMIN D PO Take 1 tablet by mouth daily.   Yes [provider]    Inpatient Medications: Scheduled Meds: . [START ON 06/03/2020] aspirin  325 mg Oral QODAY  . enoxaparin (LOVENOX) injection  40 mg Subcutaneous Q24H  . insulin aspart  0-9 Units Subcutaneous TID WC  . ipratropium-albuterol  3 mL Nebulization Q6H WA  . metoprolol succinate  12.5 mg Oral Daily  . pantoprazole  40 mg Oral Daily  . potassium chloride  20 mEq Oral BID  . [START ON 06/03/2020] predniSONE  40 mg Oral Q breakfast  . rosuvastatin  20 mg Oral Once per day on Sun Mon Wed Thu  . sodium chloride flush  3 mL Intravenous Q12H   Continuous Infusions: . sodium chloride 250 mL (06/01/20 1816)  . cefTRIAXone (ROCEPHIN)  IV 1 g (06/01/20 1819)   PRN Meds: sodium chloride, acetaminophen, ondansetron (ZOFRAN) IV, sodium chloride flush  Allergies:    Allergies  Allergen Reactions  . Ace Inhibitors Cough         Social History:   Social History   Socioeconomic History  . Marital status: Widowed    Spouse name: Not on file   . Number of children: 2  . Years of education: Not on file  . Highest education level: Not on file  Occupational History  . 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 Smoking status: Former Smoker    Packs/day: 1.00    Years: 50.00    Pack years: 50.00    Types: Cigarettes    Quit date: 08/20/1999    Years since quitting: 20.8  . Smokeless tobacco: Never Used  Vaping Use  . Vaping Use: Never used  Substance and Sexual Activity  . Alcohol use: No    Alcohol/week: 0.0 standard drinks  . Drug use: No  . Sexual activity: Not on file  Other Topics Concern  . Not on file  Social History Narrative   Widowed; wife dies August 25, 2018   Daily caffeine    Social Determinants of Health   Financial Resource Strain: Not on file  Food Insecurity: Not on file  Transportation Needs: Not on file  Physical Activity: Not on file  Stress: Not on file  Social Connections: Not on file  Intimate Partner Violence: Not on file    Family History:    Family History  Problem Relation Age of Onset  . Hypertension Father   . Ulcers Father   . Hypertension Mother   . Hypertension Sister   . Peripheral vascular disease Brother   . Hypertension Sister   . Ulcers Sister        Ulcers  . Hypertension Sister   . Lung cancer Sister        smoked  . Heart disease Sister        Carotid   . Hypertension Sister   . Heart disease Other        Cardiovascular disorder, CHF  . Stroke Other        1st degree relative male and male  . Colon cancer Neg Hx      ROS:  Please see the history of present illness.   All other ROS reviewed and negative.     Physical Exam/Data:   Vitals:   06/02/20 0654 06/02/20 0801 06/02/20 0842 06/02/20 1203  BP: (!) 159/91  (!) 159/80 (!) 144/74  Pulse: 88  94 79  Resp: _0 Temp: 97.7 F (36.5 C)  (!) 97.3 F (36.3 C) 97.6 F (36.4 C)  TempSrc: Oral  Oral Oral  SpO2: 96% 97% 96% 94%  Weight:       Height:        Intake/Output Summary (Last 24 hours) at 06/02/2020 1249 Last data filed at 06/02/2020 1238 Gross per 24 hour  Intake 617.8 ml  Output 2200 ml  Net -1582.2 ml   Last 3 Weights 06/02/2020 06/01/2020 06/01/2020  Weight (lbs) 183 lb 11.2 oz 186 lb 4.8 oz 188 lb  Weight (kg) 83.326 kg 84.505 kg 85.276 kg     Body mass index is 24.24 kg/m.  General:  Elderly male in NAD HEENT: normal Neck: no JVD Vascular: No carotid bruits  Cardiac:  RRR, no murmur Lungs:  Respirations unlabored, crackles in left base Abd: soft, nontender, no hepatomegaly  Ext: minimal edema, appears to have a chronic component with skin changes Musculoskeletal:  No deformities, BUE and BLE strength normal and equal Skin: warm and dry  Neuro:  CNs 2-12 intact, no focal abnormalities noted Psych:  Normal affect   EKG:  The EKG was personally reviewed and demonstrates:  Sinus with A-sensed, V-paced, IVCD Telemetry:  Telemetry was personally reviewed and demonstrates:  V-pacing, HR 70s  Relevant CV Studies:  Echo 06/02/20: 1. LV function is moderately reduced ~30-35%. The mid to distal seputm  into apex appears akinetic with global hypokinesis in other segments. The  RWMA may be pacer related but are a bit more than expected, and cannot  exclude LAD infarction.  2. Left ventricular ejection fraction, by estimation, is 30 to 35%. The  left ventricle has moderately decreased function. The left ventricle  demonstrates regional wall motion abnormalities (see scoring  diagram/findings for description). There is mild  concentric left ventricular hypertrophy. Left ventricular diastolic  function could not be evaluated.  3. Right ventricular systolic function is mildly reduced. The right  ventricular size is moderately enlarged.  There is moderately elevated  pulmonary artery systolic pressure. The estimated right ventricular  systolic pressure is 80.9 mmHg.  4. There appears to be mild to moderate calcific  mitral stenosis, but  doppler evaluation across the valve was not performed and severity cannot  be adequately assessed. Noted to be at least mild on last study. The  mitral valve is degenerative. Mild to  moderate mitral valve regurgitation. Severe mitral annular calcification.  5. Mild to moderate AS is present. V max 1.8 m/s, MG 8 mmHG, AVA 1.22  cm2, DI 0.39. SV index low due to cardiomyopathy (23 cc/m2). Visually  there is at least moderate AS and AVA/DI support this. The aortic valve is  calcified. Aortic valve regurgitation  is not visualized. Mild to moderate aortic valve stenosis.  6. The inferior vena cava is dilated in size with <50% respiratory  variability, suggesting right atrial pressure of 15 mmHg.   Laboratory Data:  High Sensitivity Troponin:  No results for input(s): TROPONINIHS in the last 720 hours.   Chemistry Recent Labs  Lab 06/01/20 1002 06/02/20 0605  NA 136 136  K 4.4 4.6  CL 105 100  CO2 22 24  GLUCOSE 123* 167*  BUN 22 24*  CREATININE 1.47* 1.56*  CALCIUM 8.6* 8.8*  GFRNONAA 46* 42*  ANIONGAP 9 12    No results for input(s): PROT, ALBUMIN, AST, ALT, ALKPHOS, BILITOT in the last 168 hours. Hematology Recent Labs  Lab 06/01/20 1002 06/02/20 0605  WBC 7.1 4.0  RBC 5.01 4.85  HGB 14.2 14.0  HCT 44.3 41.5  MCV 88.4 85.6  MCH 28.3 28.9  MCHC 32.1 33.7  RDW 14.4 14.3  PLT 169 167   BNP Recent Labs  Lab 06/01/20 1002  BNP 971.2*    DDimer No results for input(s): DDIMER in the last 168 hours.   Radiology/Studies:  DG Chest Port 1 View  Result Date: 06/01/2020 CLINICAL DATA:  Increased shortness of breath, cough, COPD, hypertension EXAM: PORTABLE CHEST 1 VIEW COMPARISON:  07/17/2019, 07/05/2019 FINDINGS: Chronic hyperinflation compatible with known background COPD/emphysema. Upper lobes remain clear. Heart is enlarged with increased bibasilar interstitial opacities concerning for known chronic basilar fibrosis and possibly mild  superimposed basilar edema. Difficult to exclude small pleural effusions. Two lead left subclavian pacemaker as before. Trachea midline. No pneumothorax. Aorta atherosclerotic. IMPRESSION: Cardiomegaly with increased basilar interstitial opacities suggesting component of basilar edema superimposed on chronic pulmonary fibrosis. Suspect small pleural effusions. Findings suggest early CHF. Aortic Atherosclerosis (ICD10-I70.0) and Emphysema (ICD10-J43.9). Electronically Signed   By: Jerilynn Mages.  Shick M.D.   On: 06/01/2020 10:27   ECHOCARDIOGRAM COMPLETE  Result Date: 06/02/2020    ECHOCARDIOGRAM REPORT   Patient Name:   Daniel Woodard Date of Exam: 06/02/2020 Medical Rec #:  [id number]       Height:       73.0 in Accession #:    [id number]      Weight:       183.7 lb Date of Birth:  1929-11-05       BSA:          2.075 m Patient Age:    63 years        BP:           159/80 mmHg Patient Gender: M               HR:           94 bpm. Exam Location:  Inpatient Procedure: 2D Echo, Cardiac Doppler  and Color Doppler Indications:    CHF  History:        Patient has prior history of Echocardiogram examinations, most                 recent 03/07/2019. Pacemaker, COPD, Arrythmias:Atrial                 Fibrillation, Signs/Symptoms:Shortness of Breath; Risk                 Factors:Hypertension, Diabetes and Dyslipidemia.  Sonographer:    Dustin Flock Referring Phys: [id number] RONDELL A SMITH  Sonographer Comments: Technically difficult study due to poor echo windows. Image acquisition challenging due to COPD and Image acquisition challenging due to respiratory motion. IMPRESSIONS  1. LV function is moderately reduced ~30-35%. The mid to distal seputm into apex appears akinetic with global hypokinesis in other segments. The RWMA may be pacer related but are a bit more than expected, and cannot exclude LAD infarction.  2. Left ventricular ejection fraction, by estimation, is 30 to 35%. The left ventricle has moderately decreased  function. The left ventricle demonstrates regional wall motion abnormalities (see scoring diagram/findings for description). There is mild concentric left ventricular hypertrophy. Left ventricular diastolic function could not be evaluated.  3. Right ventricular systolic function is mildly reduced. The right ventricular size is moderately enlarged. There is moderately elevated pulmonary artery systolic pressure. The estimated right ventricular systolic pressure is 63.1 mmHg.  4. There appears to be mild to moderate calcific mitral stenosis, but doppler evaluation across the valve was not performed and severity cannot be adequately assessed. Noted to be at least mild on last study. The mitral valve is degenerative. Mild to moderate mitral valve regurgitation. Severe mitral annular calcification.  5. Mild to moderate AS is present. V max 1.8 m/s, MG 8 mmHG, AVA 1.22 cm2, DI 0.39. SV index low due to cardiomyopathy (23 cc/m2). Visually there is at least moderate AS and AVA/DI support this. The aortic valve is calcified. Aortic valve regurgitation is not visualized. Mild to moderate aortic valve stenosis.  6. The inferior vena cava is dilated in size with <50% respiratory variability, suggesting right atrial pressure of 15 mmHg. Comparison(s): Changes from prior study are noted. EF now 30-35% with RWMA, mild to moderate AS present. RVSP now 64 mmHG. FINDINGS  Left Ventricle: Left ventricular ejection fraction, by estimation, is 30 to 35%. The left ventricle has moderately decreased function. The left ventricle demonstrates regional wall motion abnormalities. The left ventricular internal cavity size was normal in size. There is mild concentric left ventricular hypertrophy. Left ventricular diastolic function could not be evaluated due to mitral annular calcification (moderate or greater). Left ventricular diastolic function could not be evaluated.  LV Wall Scoring: The mid and distal anterior septum and apex are  akinetic. Right Ventricle: The right ventricular size is moderately enlarged. No increase in right ventricular wall thickness. Right ventricular systolic function is mildly reduced. There is moderately elevated pulmonary artery systolic pressure. The tricuspid regurgitant velocity is 3.50 m/s, and with an assumed right atrial pressure of 15 mmHg, the estimated right ventricular systolic pressure is 49.7 mmHg. Left Atrium: Left atrial size was normal in size. Right Atrium: Right atrial size was normal in size. Pericardium: Trivial pericardial effusion is present. Mitral Valve: There appears to be mild to moderate calcific mitral stenosis, but doppler evaluation across the valve was not performed and severity cannot be adequately assessed. Noted to be at least mild on last study. The  mitral valve is degenerative in appearance. Severe mitral annular calcification. Mild to moderate mitral valve regurgitation. Tricuspid Valve: The tricuspid valve is grossly normal. Tricuspid valve regurgitation is mild . No evidence of tricuspid stenosis. Aortic Valve: Mild to moderate AS is present. V max 1.8 m/s, MG 8 mmHG, AVA 1.22 cm2, DI 0.39. SV index low due to cardiomyopathy (23 cc/m2). Visually there is at least moderate AS and AVA/DI support this. The aortic valve is calcified. Aortic valve regurgitation is not visualized. Mild to moderate aortic stenosis is present. Aortic valve mean gradient measures 8.0 mmHg. Aortic valve peak gradient measures 13.4 mmHg. Aortic valve area, by VTI measures 1.22 cm. Pulmonic Valve: The pulmonic valve was grossly normal. Pulmonic valve regurgitation is trivial. No evidence of pulmonic stenosis. Aorta: The aortic root is normal in size and structure. Venous: The inferior vena cava is dilated in size with less than 50% respiratory variability, suggesting right atrial pressure of 15 mmHg. IAS/Shunts: The atrial septum is grossly normal. Additional Comments: A pacer wire is visualized in the  right atrium and right ventricle.  LEFT VENTRICLE PLAX 2D LVIDd:         3.90 cm  Diastology LVIDs:         2.90 cm  LV e' medial:    4.24 cm/s LV PW:         1.30 cm  LV E/e' medial:  30.4 LV IVS:        1.20 cm  LV e' lateral:   5.22 cm/s LVOT diam:     2.00 cm  LV E/e' lateral: 24.7 LV SV:         49 LV SV Index:   23 LVOT Area:     3.14 cm  RIGHT VENTRICLE RV Basal diam:  3.10 cm RV S prime:     10.80 cm/s TAPSE (M-mode): 2.4 cm LEFT ATRIUM             Index       RIGHT ATRIUM           Index LA diam:        4.40 cm 2.12 cm/m  RA Area:     19.40 cm LA Vol (A2C):   68.6 ml 33.06 ml/m RA Volume:   53.10 ml  25.59 ml/m LA Vol (A4C):   67.2 ml 32.38 ml/m LA Biplane Vol: 75.6 ml 36.43 ml/m  AORTIC VALVE AV Area (Vmax):    1.36 cm AV Area (Vmean):   1.30 cm AV Area (VTI):     1.22 cm AV Vmax:           183.33 cm/s AV Vmean:          122.000 cm/s AV VTI:            0.398 m AV Peak Grad:      13.4 mmHg AV Mean Grad:      8.0 mmHg LVOT Vmax:         79.30 cm/s LVOT Vmean:        50.300 cm/s LVOT VTI:          0.155 m LVOT/AV VTI ratio: 0.39  AORTA Ao Root diam: 2.00 cm MITRAL VALVE                TRICUSPID VALVE MV Area (PHT): 7.02 cm     TR Peak grad:   49.0 mmHg MV Decel Time: 108 msec     TR Vmax:  350.00 cm/s MV E velocity: 129.00 cm/s MV A velocity: 113.00 cm/s  SHUNTS MV E/A ratio:  1.14         Systemic VTI:  0.16 m                             Systemic Diam: 2.00 cm Eleonore Chiquito MD Electronically signed by Eleonore Chiquito MD Signature Date/Time: 06/02/2020/11:53:55 AM    Final      Assessment and Plan:   New systolic heart failure - pt presented with worsening DOE and orthopnea - echo this admission with EF 30-35% - BNP elevated > 900 - diuresing on 40 mg IV lasix BID - overall net negative 2.6 L with nearly 3 L urine output - REDS clip 24% (right lung field) - echo reviewed with attending - evidence of dyssynchrony  - may benefit from CRT upgrade, but would need ischemic evaluation  first - coronary atherosclerosis reported on CT chest 07/17/19   Hypertension - pressure elevated SBP 150s (right arm) - toprol 12.5 mg daily - will avoid ARB/ACEI/ARNI for now given renal function and possible need for angiography - would benefit from the above given DM, but will need to closely monitor renal function - consider adding hydralazine 25 mg TID for better pressure control   Second degree heart block s/p PPM - last interrogation with good function - no impedence  - ~80% A-sense, V-paced, ~ 20% A-paced, V-paced - interrogation this admission reviewed   Carotid artery stenosis s/p L CEA Chronic left subclavian occlusion - on 325 mg ASA - followed by VVS   CKD stage III - sCr 1.56 (1.47) - near baseline - will stop IV lasix in anticipation of possible heart cath tomorrow   DM2 - A1c 6.1% - continue SSI   Hypothyroidism - TSH 5.609 - will need to follow this in OP setting     New York Heart Association (NYHA) Functional Class NYHA Class II      For questions or updates, please contact [facility] Please consult [email or web address] for contact info under    Signed, Ledora Bottcher, PA  06/02/2020 12:49 PM   I have seen and examined the patient along with Ledora Bottcher, PA .  I have reviewed the chart, notes and new data.  I agree with PA/NP's note.  Key new complaints: He has chronic exertional dyspnea attributable to COPD, but developed orthopnea and worsening exertional dyspnea that resolved promptly with diuretic therapy.  He has never had angina pectoris. Key examination changes: Clear lungs, regular rate and rhythm, paradoxically split S2, early peaking 2/6 aortic ejection murmur radiating towards both carotids, weak pulse left radial, diminished blood pressure left upper extremity, pacemaker site Key new findings / data: Pacemaker interrogation shows 80% ventricular pacing due to second-degree heart block, despite AAI-DDD programming.  Echo  shows moderately depressed left ventricular systolic function, with EF around 35% and in my opinion marked systolic dyssynchrony due to RV pacing.  No typical coronary distribution regional wall motion abnormalities.  Note moderate to severe pulmonary artery hypertension with estimated systolic PAP 64 mmHg and dilated inferior vena cava.  PLAN: Mr. Severe echocardiogram shows drastic reduction in left ventricular systolic function compared to an echo performed in 2020.  The global reduction in LV performance with profound systolic dyssynchrony strongly suggestive of pacing induced cardiomyopathy, but he also has a very strong history of peripheral arterial disease and therefore is at risk of  having multivessel CAD.  Recommend coronary angiography, but would only pursue revascularization if he has critical and widespread disease.  He does not have angina pectoris and revascularization of incidentally discovered CAD that is disproportionately limited compared to his cardiomyopathy is not indicated.  If he does not have extensive multivessel CAD that can explain his cardiomyopathy, he would benefit from upgrade to CRT-P. Risks and benefits of coronary angiography were discussed in detail.  In particular we discussed the risk of renal insufficiency since he has baseline CKD stage IIIb.  Mr. Maniaci asked several questions and I responded in detail.  He agrees to go ahead with the planned procedure.  Sanda Klein, MD, [place] [phone] 06/02/2020, 1:15 PM

## 2020-06-02 NOTE — Evaluation (Signed)
Physical Therapy Evaluation and Discharge Patient Details Name: Daniel Woodard MRN: [id number] DOB: December 27, 1929 Today's Date: 06/02/2020   History of Present Illness  Daniel Woodard is a 87 y.o. male with medical history significant of atrial fibrillation, complete heart block s/p pacemaker, hypertension, hyperlipidemia, COPD, and diabetes mellitus type 2 presents with complaints of progressively worsening shortness of breath over the last week. Admitted with diastolic congestive heart failure exacerbation and COPD exacerbation.  Clinical Impression  Patient evaluated by Physical Therapy with no further acute PT needs identified. Prior to admission, pt lives alone in a town home and is independent. Pt presents with decreased cardiopulmonary endurance; he states he feels close to his functional baseline. Pt ambulating 240 feet with no assistive device or physical assist; HR 91-122 bpm, SpO2 90-93% on RA. Required 2 brief standing rest breaks. Education provided regarding exercise and DME recommendations (use of Rollator with longer distances) and energy conservation. All education has been completed and the patient has no further questions. No Physical Therapy or equipment needs. PT is signing off. Thank you for this referral.     Follow Up Recommendations No PT follow up (pt declining OP f/u services)    Equipment Recommendations  None recommended by PT    Recommendations for Other Services       Precautions / Restrictions Precautions Precautions: None Restrictions Weight Bearing Restrictions: No      Mobility  Bed Mobility Overal bed mobility: Modified Independent             General bed mobility comments: HOB elevated    Transfers Overall transfer level: Independent Equipment used: None                Ambulation/Gait Ambulation/Gait assistance: Modified independent (Device/Increase time) Gait Distance (Feet): 240 Feet Assistive device: None Gait  Pattern/deviations: Step-through pattern;Decreased stride length     General Gait Details: Required 2 standing rest breaks, no evidence of gross instability  Stairs            Wheelchair Mobility    Modified Rankin (Stroke Patients Only)       Balance Overall balance assessment: Mild deficits observed, not formally tested                                           Pertinent Vitals/Pain Pain Assessment: No/denies pain    Home Living Family/patient expects to be discharged to:: Private residence Living Arrangements: Alone Available Help at Discharge: Family (2 daughters, granddaughter) Type of Home: Other(Comment) (townhouse) Home Access: Level entry     Home Layout: One level Home Equipment: Environmental consultant - 2 wheels;Cane - single point;Wheelchair - [employer] - 4 wheels      Prior Function Level of Independence: Independent         Comments: Reports difficulty with ambulating longer distances intermittently     Hand Dominance        Extremity/Trunk Assessment   Upper Extremity Assessment Upper Extremity Assessment: Defer to OT evaluation    Lower Extremity Assessment Lower Extremity Assessment: Overall WFL for tasks assessed       Communication   Communication: No difficulties  Cognition Arousal/Alertness: Awake/alert Behavior During Therapy: WFL for tasks assessed/performed Overall Cognitive Status: Within Functional Limits for tasks assessed  General Comments      Exercises     Assessment/Plan    PT Assessment Patent does not need any further PT services  PT Problem List         PT Treatment Interventions      PT Goals (Current goals can be found in the Care Plan section)  Acute Rehab PT Goals Patient Stated Goal: improved breathing with activity PT Goal Formulation: All assessment and education complete, DC therapy    Frequency     Barriers to discharge         Co-evaluation               AM-PAC PT "6 Clicks" Mobility  Outcome Measure Help needed turning from your back to your side while in a flat bed without using bedrails?: None Help needed moving from lying on your back to sitting on the side of a flat bed without using bedrails?: None Help needed moving to and from a bed to a chair (including a wheelchair)?: None Help needed standing up from a chair using your arms (e.g., wheelchair or bedside chair)?: None Help needed to walk in hospital room?: None Help needed climbing 3-5 steps with a railing? : A Little 6 Click Score: 23    End of Session   Activity Tolerance: Patient tolerated treatment well Patient left: in chair;with call bell/phone within reach   PT Visit Diagnosis: Difficulty in walking, not elsewhere classified (R26.2)    Time: [id number] PT Time Calculation (min) (ACUTE ONLY): 22 min   Charges:   PT Evaluation $PT Eval Moderate Complexity: 1 Mod          Wyona Almas, PT, DPT Acute Rehabilitation Services Pager [phone] Office [phone]   Deno Etienne 06/02/2020, 9:04 AM

## 2020-06-02 NOTE — Consult Note (Addendum)
Cardiology Consultation:   Patient ID: Daniel Woodard MRN: [id number]; DOB: 11/08/1929  Admit date: 06/01/2020 Date of Consult: 06/02/2020  Primary Care Provider: Biagio Borg, MD [facility] Cardiologist: Sanda Klein, MD - new [facility] Electrophysiologist:  Cristopher Peru, MD    Patient Profile:   Daniel Woodard is a 87 y.o. male with a hx of HTN, DM2, COPD, HLD, CKD stage III, 2:1 AV block s/p PPM insertion in 2015, carotid artery stenosis s/p left CEA (2001), and known chronic left subclavian artery occlusion who is being seen today for the evaluation of CHF at the request of Dr. Loleta Books.  History of Present Illness:   Daniel Woodard is known to this service with history of mobitz 2 HB and PPM in place (DDD). He is maintained on 325 mg ASA with known left subclavian occlusion and  Left CEA. He does not have a history of CAD, but Dr. Lovena Le suspects a component of diastolic heart failure and has historically provided PRN lasix. He was last seen by Dr. Lovena Le 07/23/19 and was doing well at that time. He follows with pulmonary for COPD and has chronic cough.  He presented to [facility] with complaints of worsening shortness of breath, DOE, orthopnea, and LE edema. He was admitted with concern for congestive heart failure. He reports dry weight of 185-190 lbs and denies weight gain.   ER workup: TSH 5.609 BNP 971 sCr 1.47 --> 1.56 A1c 6.1%  Cardiology was consulted for suspected heart failure. Echocardiogram has been completed.    He reports worsening DOE over the preceding 3-4 days prior to admission. He at first denies orthopnea, but does report increasing the number of pillows he sleeps with over the past 3-4 days. He lives at home and has help with meals from his daughters. He does not eat excessive sodium. He can complete ADLs. He reports significant improvement in symptoms today and was able to walk in the hall without dyspnea today. He denies chest pain, palpitations, and syncope.    REDS Clip reading today was 24%.   Past Medical History:  Diagnosis Date  . ALLERGIC RHINITIS 04/09/2007  . Atrial fibrillation ([place])   . BACTEREMIA, 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  . BENIGN PROSTATIC HYPERTROPHY 04/09/2007  . Blood transfusion without reported diagnosis    really not sure thinks 30-40 years ago  . BRADYCARDIA 05/11/2009  . Carotid artery occlusion   . COLONIC POLYPS, HX OF 04/09/2007   ADENOMATOUS POLYPS [id number],[id number]  . Complete heart block ([place])   . COPD 04/09/2007  . Cough 03/27/2008  . DEPRESSION 04/09/2007  . DIABETES MELLITUS, TYPE II 10/09/2007  . DIVERTICULOSIS, COLON 04/09/2007  . DIZZINESS 10/06/2008  . GLUCOSE INTOLERANCE 04/09/2007  . HYPERLIPIDEMIA 03/27/2007  . HYPERTENSION 03/27/2007  . PERIPHERAL VASCULAR DISEASE 03/27/2007  . PNEUMONIA ORGANISM NOS 11/08/2007  . SHINGLES, HX OF 04/09/2007  . SHOULDER PAIN, BILATERAL 08/01/2007  . Ulcer    40 years ago    Past Surgical History:  Procedure Laterality Date  . APPENDECTOMY     87 years old  . CAROTID ENDARTERECTOMY  5/08   left  . CARPAL TUNNEL RELEASE     bilateral  . COLONOSCOPY    . ESOPHAGOGASTRODUODENOSCOPY  06/07/2004  . HEMORRHOID BANDING  1990s  . PACEMAKER INSERTION  07/11/2013   MDT ADDRL1 pacemaker implanted by Dr Lovena Le for complete heart block  . PERMANENT PACEMAKER INSERTION N/A 07/11/2013   Procedure: PERMANENT PACEMAKER INSERTION;  Surgeon: Evans Lance, MD;  Location:  [facility];  Service: Cardiovascular;  Laterality: N/A;  . s/p PUD surgury     ? partial gastrectomy     Home Medications:  Prior to Admission medications   Medication Sig Start Date End Date Taking? Authorizing Provider  albuterol (VENTOLIN HFA) 108 (90 Base) MCG/ACT inhaler Inhale 2 puffs into the lungs every 6 (six) hours as needed for wheezing or shortness of breath. 04/03/20  Yes Lauraine Rinne, NP  aspirin 325 MG tablet Take 1 tablet (325 mg total) by mouth daily. Patient taking differently:  Take 325 mg by mouth every other day. 03/08/19  Yes Geradine Girt, DO  Coenzyme Q10 (COQ-10) 200 MG CAPS Take 200 mg by mouth daily after lunch.   Yes [provider]  metoprolol succinate (TOPROL-XL) 25 MG 24 hr tablet Take 1/2 (one-half) tablet by mouth once daily Patient taking differently: Take 12.5 mg by mouth daily. 05/19/20  Yes Biagio Borg, MD  omeprazole (PRILOSEC) 20 MG capsule TAKE 1 CAPSULE BY MOUTH ONCE DAILY Patient taking differently: Take 20 mg by mouth daily. 11/20/19  Yes Biagio Borg, MD  Psyllium (METAMUCIL PO) Take 2 capsules by mouth daily.   Yes [provider]  rosuvastatin (CRESTOR) 20 MG tablet Take 2 tablets (40 mg total) by mouth every other day. Patient taking differently: Take 20 mg by mouth See admin instructions. Take one tablet (20 mg) by mouth daily at bedtime for 2 nights, skip 3rd night, then repeat. 02/26/20  Yes Biagio Borg, MD  VITAMIN D PO Take 1 tablet by mouth daily.   Yes [provider]    Inpatient Medications: Scheduled Meds: . [START ON 06/03/2020] aspirin  325 mg Oral QODAY  . enoxaparin (LOVENOX) injection  40 mg Subcutaneous Q24H  . insulin aspart  0-9 Units Subcutaneous TID WC  . ipratropium-albuterol  3 mL Nebulization Q6H WA  . metoprolol succinate  12.5 mg Oral Daily  . pantoprazole  40 mg Oral Daily  . potassium chloride  20 mEq Oral BID  . [START ON 06/03/2020] predniSONE  40 mg Oral Q breakfast  . rosuvastatin  20 mg Oral Once per day on Sun Mon Wed Thu  . sodium chloride flush  3 mL Intravenous Q12H   Continuous Infusions: . sodium chloride 250 mL (06/01/20 1816)  . cefTRIAXone (ROCEPHIN)  IV 1 g (06/01/20 1819)   PRN Meds: sodium chloride, acetaminophen, ondansetron (ZOFRAN) IV, sodium chloride flush  Allergies:    Allergies  Allergen Reactions  . Ace Inhibitors Cough         Social History:   Social History   Socioeconomic History  . Marital status: Widowed    Spouse name: Not on file   . Number of children: 2  . Years of education: Not on file  . Highest education level: Not on file  Occupational History  . 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 Smoking status: Former Smoker    Packs/day: 1.00    Years: 50.00    Pack years: 50.00    Types: Cigarettes    Quit date: 08/20/1999    Years since quitting: 20.8  . Smokeless tobacco: Never Used  Vaping Use  . Vaping Use: Never used  Substance and Sexual Activity  . Alcohol use: No    Alcohol/week: 0.0 standard drinks  . Drug use: No  . Sexual activity: Not on file  Other Topics Concern  . Not on file  Social History Narrative   Widowed; wife dies 2018-08-10   Daily caffeine    Social Determinants of Health   Financial Resource Strain: Not on file  Food Insecurity: Not on file  Transportation Needs: Not on file  Physical Activity: Not on file  Stress: Not on file  Social Connections: Not on file  Intimate Partner Violence: Not on file    Family History:    Family History  Problem Relation Age of Onset  . Hypertension Father   . Ulcers Father   . Hypertension Mother   . Hypertension Sister   . Peripheral vascular disease Brother   . Hypertension Sister   . Ulcers Sister        Ulcers  . Hypertension Sister   . Lung cancer Sister        smoked  . Heart disease Sister        Carotid   . Hypertension Sister   . Heart disease Other        Cardiovascular disorder, CHF  . Stroke Other        1st degree relative male and male  . Colon cancer Neg Hx      ROS:  Please see the history of present illness.   All other ROS reviewed and negative.     Physical Exam/Data:   Vitals:   06/02/20 0654 06/02/20 0801 06/02/20 0842 06/02/20 1203  BP: (!) 159/91  (!) 159/80 (!) 144/74  Pulse: 88  94 79  Resp: _0 Temp: 97.7 F (36.5 C)  (!) 97.3 F (36.3 C) 97.6 F (36.4 C)  TempSrc: Oral  Oral Oral  SpO2: 96% 97% 96% 94%  Weight:       Height:        Intake/Output Summary (Last 24 hours) at 06/02/2020 1249 Last data filed at 06/02/2020 1238 Gross per 24 hour  Intake 617.8 ml  Output 2200 ml  Net -1582.2 ml   Last 3 Weights 06/02/2020 06/01/2020 06/01/2020  Weight (lbs) 183 lb 11.2 oz 186 lb 4.8 oz 188 lb  Weight (kg) 83.326 kg 84.505 kg 85.276 kg     Body mass index is 24.24 kg/m.  General:  Elderly male in NAD HEENT: normal Neck: no JVD Vascular: No carotid bruits  Cardiac:  RRR, no murmur Lungs:  Respirations unlabored, crackles in left base Abd: soft, nontender, no hepatomegaly  Ext: minimal edema, appears to have a chronic component with skin changes Musculoskeletal:  No deformities, BUE and BLE strength normal and equal Skin: warm and dry  Neuro:  CNs 2-12 intact, no focal abnormalities noted Psych:  Normal affect   EKG:  The EKG was personally reviewed and demonstrates:  Sinus with A-sensed, V-paced, IVCD Telemetry:  Telemetry was personally reviewed and demonstrates:  V-pacing, HR 70s  Relevant CV Studies:  Echo 06/02/20: 1. LV function is moderately reduced ~30-35%. The mid to distal seputm  into apex appears akinetic with global hypokinesis in other segments. The  RWMA may be pacer related but are a bit more than expected, and cannot  exclude LAD infarction.  2. Left ventricular ejection fraction, by estimation, is 30 to 35%. The  left ventricle has moderately decreased function. The left ventricle  demonstrates regional wall motion abnormalities (see scoring  diagram/findings for description). There is mild  concentric left ventricular hypertrophy. Left ventricular diastolic  function could not be evaluated.  3. Right ventricular systolic function is mildly reduced. The right  ventricular size is moderately enlarged.  There is moderately elevated  pulmonary artery systolic pressure. The estimated right ventricular  systolic pressure is 64.0 mmHg.  4. There appears to be mild to moderate calcific  mitral stenosis, but  doppler evaluation across the valve was not performed and severity cannot  be adequately assessed. Noted to be at least mild on last study. The  mitral valve is degenerative. Mild to  moderate mitral valve regurgitation. Severe mitral annular calcification.  5. Mild to moderate AS is present. V max 1.8 m/s, MG 8 mmHG, AVA 1.22  cm2, DI 0.39. SV index low due to cardiomyopathy (23 cc/m2). Visually  there is at least moderate AS and AVA/DI support this. The aortic valve is  calcified. Aortic valve regurgitation  is not visualized. Mild to moderate aortic valve stenosis.  6. The inferior vena cava is dilated in size with <50% respiratory  variability, suggesting right atrial pressure of 15 mmHg.   Laboratory Data:  High Sensitivity Troponin:  No results for input(s): TROPONINIHS in the last 720 hours.   Chemistry Recent Labs  Lab 06/01/20 1002 06/02/20 0605  NA 136 136  K 4.4 4.6  CL 105 100  CO2 22 24  GLUCOSE 123* 167*  BUN 22 24*  CREATININE 1.47* 1.56*  CALCIUM 8.6* 8.8*  GFRNONAA 46* 42*  ANIONGAP 9 12    No results for input(s): PROT, ALBUMIN, AST, ALT, ALKPHOS, BILITOT in the last 168 hours. Hematology Recent Labs  Lab 06/01/20 1002 06/02/20 0605  WBC 7.1 4.0  RBC 5.01 4.85  HGB 14.2 14.0  HCT 44.3 41.5  MCV 88.4 85.6  MCH 28.3 28.9  MCHC 32.1 33.7  RDW 14.4 14.3  PLT 169 167   BNP Recent Labs  Lab 06/01/20 1002  BNP 971.2*    DDimer No results for input(s): DDIMER in the last 168 hours.   Radiology/Studies:  DG Chest Port 1 View  Result Date: 06/01/2020 CLINICAL DATA:  Increased shortness of breath, cough, COPD, hypertension EXAM: PORTABLE CHEST 1 VIEW COMPARISON:  07/17/2019, 07/05/2019 FINDINGS: Chronic hyperinflation compatible with known background COPD/emphysema. Upper lobes remain clear. Heart is enlarged with increased bibasilar interstitial opacities concerning for known chronic basilar fibrosis and possibly mild  superimposed basilar edema. Difficult to exclude small pleural effusions. Two lead left subclavian pacemaker as before. Trachea midline. No pneumothorax. Aorta atherosclerotic. IMPRESSION: Cardiomegaly with increased basilar interstitial opacities suggesting component of basilar edema superimposed on chronic pulmonary fibrosis. Suspect small pleural effusions. Findings suggest early CHF. Aortic Atherosclerosis (ICD10-I70.0) and Emphysema (ICD10-J43.9). Electronically Signed   By: M.  Shick M.D.   On: 06/01/2020 10:27   ECHOCARDIOGRAM COMPLETE  Result Date: 06/02/2020    ECHOCARDIOGRAM REPORT   Patient Name:   Daniel Woodard Date of Exam: 06/02/2020 Medical Rec #:  [id number]       Height:       73.0 in Accession #:    [id number]      Weight:       183.7 lb Date of Birth:  06/08/1929       BSA:          2.075 m Patient Age:    89 years        BP:           159/80 mmHg Patient Gender: M               HR:           94 bpm. Exam Location:  Inpatient Procedure: 2D Echo, Cardiac Doppler   and Color Doppler Indications:    CHF  History:        Patient has prior history of Echocardiogram examinations, most                 recent 03/07/2019. Pacemaker, COPD, Arrythmias:Atrial                 Fibrillation, Signs/Symptoms:Shortness of Breath; Risk                 Factors:Hypertension, Diabetes and Dyslipidemia.  Sonographer:    Dustin Flock Referring Phys: [id number] RONDELL A SMITH  Sonographer Comments: Technically difficult study due to poor echo windows. Image acquisition challenging due to COPD and Image acquisition challenging due to respiratory motion. IMPRESSIONS  1. LV function is moderately reduced ~30-35%. The mid to distal seputm into apex appears akinetic with global hypokinesis in other segments. The RWMA may be pacer related but are a bit more than expected, and cannot exclude LAD infarction.  2. Left ventricular ejection fraction, by estimation, is 30 to 35%. The left ventricle has moderately decreased  function. The left ventricle demonstrates regional wall motion abnormalities (see scoring diagram/findings for description). There is mild concentric left ventricular hypertrophy. Left ventricular diastolic function could not be evaluated.  3. Right ventricular systolic function is mildly reduced. The right ventricular size is moderately enlarged. There is moderately elevated pulmonary artery systolic pressure. The estimated right ventricular systolic pressure is 63.1 mmHg.  4. There appears to be mild to moderate calcific mitral stenosis, but doppler evaluation across the valve was not performed and severity cannot be adequately assessed. Noted to be at least mild on last study. The mitral valve is degenerative. Mild to moderate mitral valve regurgitation. Severe mitral annular calcification.  5. Mild to moderate AS is present. V max 1.8 m/s, MG 8 mmHG, AVA 1.22 cm2, DI 0.39. SV index low due to cardiomyopathy (23 cc/m2). Visually there is at least moderate AS and AVA/DI support this. The aortic valve is calcified. Aortic valve regurgitation is not visualized. Mild to moderate aortic valve stenosis.  6. The inferior vena cava is dilated in size with <50% respiratory variability, suggesting right atrial pressure of 15 mmHg. Comparison(s): Changes from prior study are noted. EF now 30-35% with RWMA, mild to moderate AS present. RVSP now 64 mmHG. FINDINGS  Left Ventricle: Left ventricular ejection fraction, by estimation, is 30 to 35%. The left ventricle has moderately decreased function. The left ventricle demonstrates regional wall motion abnormalities. The left ventricular internal cavity size was normal in size. There is mild concentric left ventricular hypertrophy. Left ventricular diastolic function could not be evaluated due to mitral annular calcification (moderate or greater). Left ventricular diastolic function could not be evaluated.  LV Wall Scoring: The mid and distal anterior septum and apex are  akinetic. Right Ventricle: The right ventricular size is moderately enlarged. No increase in right ventricular wall thickness. Right ventricular systolic function is mildly reduced. There is moderately elevated pulmonary artery systolic pressure. The tricuspid regurgitant velocity is 3.50 m/s, and with an assumed right atrial pressure of 15 mmHg, the estimated right ventricular systolic pressure is 49.7 mmHg. Left Atrium: Left atrial size was normal in size. Right Atrium: Right atrial size was normal in size. Pericardium: Trivial pericardial effusion is present. Mitral Valve: There appears to be mild to moderate calcific mitral stenosis, but doppler evaluation across the valve was not performed and severity cannot be adequately assessed. Noted to be at least mild on last study. The  mitral valve is degenerative in appearance. Severe mitral annular calcification. Mild to moderate mitral valve regurgitation. Tricuspid Valve: The tricuspid valve is grossly normal. Tricuspid valve regurgitation is mild . No evidence of tricuspid stenosis. Aortic Valve: Mild to moderate AS is present. V max 1.8 m/s, MG 8 mmHG, AVA 1.22 cm2, DI 0.39. SV index low due to cardiomyopathy (23 cc/m2). Visually there is at least moderate AS and AVA/DI support this. The aortic valve is calcified. Aortic valve regurgitation is not visualized. Mild to moderate aortic stenosis is present. Aortic valve mean gradient measures 8.0 mmHg. Aortic valve peak gradient measures 13.4 mmHg. Aortic valve area, by VTI measures 1.22 cm. Pulmonic Valve: The pulmonic valve was grossly normal. Pulmonic valve regurgitation is trivial. No evidence of pulmonic stenosis. Aorta: The aortic root is normal in size and structure. Venous: The inferior vena cava is dilated in size with less than 50% respiratory variability, suggesting right atrial pressure of 15 mmHg. IAS/Shunts: The atrial septum is grossly normal. Additional Comments: A pacer wire is visualized in the  right atrium and right ventricle.  LEFT VENTRICLE PLAX 2D LVIDd:         3.90 cm  Diastology LVIDs:         2.90 cm  LV e' medial:    4.24 cm/s LV PW:         1.30 cm  LV E/e' medial:  30.4 LV IVS:        1.20 cm  LV e' lateral:   5.22 cm/s LVOT diam:     2.00 cm  LV E/e' lateral: 24.7 LV SV:         49 LV SV Index:   23 LVOT Area:     3.14 cm  RIGHT VENTRICLE RV Basal diam:  3.10 cm RV S prime:     10.80 cm/s TAPSE (M-mode): 2.4 cm LEFT ATRIUM             Index       RIGHT ATRIUM           Index LA diam:        4.40 cm 2.12 cm/m  RA Area:     19.40 cm LA Vol (A2C):   68.6 ml 33.06 ml/m RA Volume:   53.10 ml  25.59 ml/m LA Vol (A4C):   67.2 ml 32.38 ml/m LA Biplane Vol: 75.6 ml 36.43 ml/m  AORTIC VALVE AV Area (Vmax):    1.36 cm AV Area (Vmean):   1.30 cm AV Area (VTI):     1.22 cm AV Vmax:           183.33 cm/s AV Vmean:          122.000 cm/s AV VTI:            0.398 m AV Peak Grad:      13.4 mmHg AV Mean Grad:      8.0 mmHg LVOT Vmax:         79.30 cm/s LVOT Vmean:        50.300 cm/s LVOT VTI:          0.155 m LVOT/AV VTI ratio: 0.39  AORTA Ao Root diam: 2.00 cm MITRAL VALVE                TRICUSPID VALVE MV Area (PHT): 7.02 cm     TR Peak grad:   49.0 mmHg MV Decel Time: 108 msec     TR Vmax:  350.00 cm/s MV E velocity: 129.00 cm/s MV A velocity: 113.00 cm/s  SHUNTS MV E/A ratio:  1.14         Systemic VTI:  0.16 m                             Systemic Diam: 2.00 cm Eleonore Chiquito MD Electronically signed by Eleonore Chiquito MD Signature Date/Time: 06/02/2020/11:53:55 AM    Final      Assessment and Plan:   New systolic heart failure - pt presented with worsening DOE and orthopnea - echo this admission with EF 30-35% - BNP elevated > 900 - diuresing on 40 mg IV lasix BID - overall net negative 2.6 L with nearly 3 L urine output - REDS clip 24% (right lung field) - echo reviewed with attending - evidence of dyssynchrony  - may benefit from CRT upgrade, but would need ischemic evaluation  first - coronary atherosclerosis reported on CT chest 07/17/19   Hypertension - pressure elevated SBP 150s (right arm) - toprol 12.5 mg daily - will avoid ARB/ACEI/ARNI for now given renal function and possible need for angiography - would benefit from the above given DM, but will need to closely monitor renal function - consider adding hydralazine 25 mg TID for better pressure control   Second degree heart block s/p PPM - last interrogation with good function - no impedence  - ~80% A-sense, V-paced, ~ 20% A-paced, V-paced - interrogation this admission reviewed   Carotid artery stenosis s/p L CEA Chronic left subclavian occlusion - on 325 mg ASA - followed by VVS   CKD stage III - sCr 1.56 (1.47) - near baseline - will stop IV lasix in anticipation of possible heart cath tomorrow   DM2 - A1c 6.1% - continue SSI   Hypothyroidism - TSH 5.609 - will need to follow this in OP setting     New York Heart Association (NYHA) Functional Class NYHA Class II      For questions or updates, please contact [facility] Please consult [email or web address] for contact info under    Signed, Ledora Bottcher, PA  06/02/2020 12:49 PM   I have seen and examined the patient along with Ledora Bottcher, PA .  I have reviewed the chart, notes and new data.  I agree with PA/NP's note.  Key new complaints: He has chronic exertional dyspnea attributable to COPD, but developed orthopnea and worsening exertional dyspnea that resolved promptly with diuretic therapy.  He has never had angina pectoris. Key examination changes: Clear lungs, regular rate and rhythm, paradoxically split S2, early peaking 2/6 aortic ejection murmur radiating towards both carotids, weak pulse left radial, diminished blood pressure left upper extremity, pacemaker site Key new findings / data: Pacemaker interrogation shows 80% ventricular pacing due to second-degree heart block, despite AAI-DDD programming.  Echo  shows moderately depressed left ventricular systolic function, with EF around 35% and in my opinion marked systolic dyssynchrony due to RV pacing.  No typical coronary distribution regional wall motion abnormalities.  Note moderate to severe pulmonary artery hypertension with estimated systolic PAP 64 mmHg and dilated inferior vena cava.  PLAN: Daniel Woodard echocardiogram shows drastic reduction in left ventricular systolic function compared to an echo performed in 2020.  The global reduction in LV performance with profound systolic dyssynchrony strongly suggestive of pacing induced cardiomyopathy, but he also has a very strong history of peripheral arterial disease and therefore is at risk of  having multivessel CAD.  Recommend coronary angiography, but would only pursue revascularization if he has critical and widespread disease.  He does not have angina pectoris and revascularization of incidentally discovered CAD that is disproportionately limited compared to his cardiomyopathy is not indicated.  If he does not have extensive multivessel CAD that can explain his cardiomyopathy, he would benefit from upgrade to CRT-P. Risks and benefits of coronary angiography were discussed in detail.  In particular we discussed the risk of renal insufficiency since he has baseline CKD stage IIIb.  Daniel Woodard asked several questions and I responded in detail.  He agrees to go ahead with the planned procedure.  Sanda Klein, MD, [place] [phone] 06/02/2020, 1:15 PM

## 2020-06-02 NOTE — Progress Notes (Signed)
   F/u appt made and post-cath instructions in the chart for d/c.   Theodore Demark, PA-C 06/02/2020 4:12 PM

## 2020-06-03 ENCOUNTER — Encounter (HOSPITAL_COMMUNITY): Payer: Self-pay | Admitting: Interventional Cardiology

## 2020-06-03 DIAGNOSIS — I5043 Acute on chronic combined systolic (congestive) and diastolic (congestive) heart failure: Secondary | ICD-10-CM | POA: Diagnosis not present

## 2020-06-03 DIAGNOSIS — I441 Atrioventricular block, second degree: Secondary | ICD-10-CM

## 2020-06-03 DIAGNOSIS — I5033 Acute on chronic diastolic (congestive) heart failure: Secondary | ICD-10-CM | POA: Diagnosis not present

## 2020-06-03 DIAGNOSIS — I10 Essential (primary) hypertension: Secondary | ICD-10-CM | POA: Diagnosis not present

## 2020-06-03 DIAGNOSIS — Z950 Presence of cardiac pacemaker: Secondary | ICD-10-CM | POA: Diagnosis not present

## 2020-06-03 DIAGNOSIS — N1832 Chronic kidney disease, stage 3b: Secondary | ICD-10-CM | POA: Diagnosis not present

## 2020-06-03 DIAGNOSIS — E119 Type 2 diabetes mellitus without complications: Secondary | ICD-10-CM | POA: Diagnosis not present

## 2020-06-03 DIAGNOSIS — J441 Chronic obstructive pulmonary disease with (acute) exacerbation: Secondary | ICD-10-CM | POA: Diagnosis not present

## 2020-06-03 DIAGNOSIS — I130 Hypertensive heart and chronic kidney disease with heart failure and stage 1 through stage 4 chronic kidney disease, or unspecified chronic kidney disease: Secondary | ICD-10-CM | POA: Diagnosis not present

## 2020-06-03 DIAGNOSIS — I509 Heart failure, unspecified: Secondary | ICD-10-CM | POA: Diagnosis not present

## 2020-06-03 LAB — BASIC METABOLIC PANEL
Anion gap: 13 (ref 5–15)
BUN: 35 mg/dL — ABNORMAL HIGH (ref 8–23)
CO2: 25 mmol/L (ref 22–32)
Calcium: 8.6 mg/dL — ABNORMAL LOW (ref 8.9–10.3)
Chloride: 100 mmol/L (ref 98–111)
Creatinine, Ser: 1.73 mg/dL — ABNORMAL HIGH (ref 0.61–1.24)
GFR, Estimated: 38 mL/min — ABNORMAL LOW (ref >60)
Glucose, Bld: 161 mg/dL — ABNORMAL HIGH (ref 70–99)
Potassium: 4.5 mmol/L (ref 3.5–5.1)
Sodium: 138 mmol/L (ref 135–145)

## 2020-06-03 LAB — GLUCOSE, CAPILLARY: Glucose-Capillary: 127 mg/dL — ABNORMAL HIGH (ref 70–99)

## 2020-06-03 MED ORDER — PREDNISONE 20 MG PO TABS: 40.0000 mg | ORAL_TABLET | Freq: Every day | ORAL | 0 refills | Status: AC

## 2020-06-03 MED ORDER — FUROSEMIDE 40 MG PO TABS: 40.0000 mg | ORAL_TABLET | Freq: Every day | ORAL | 1 refills | Status: DC

## 2020-06-03 MED ORDER — POTASSIUM CHLORIDE CRYS ER 20 MEQ PO TBCR: 20.0000 meq | EXTENDED_RELEASE_TABLET | Freq: Every day | ORAL | 1 refills | Status: DC

## 2020-06-03 MED ORDER — POTASSIUM CHLORIDE CRYS ER 20 MEQ PO TBCR
20.0000 meq | EXTENDED_RELEASE_TABLET | Freq: Every day | ORAL | Status: DC
  Administered 2020-06-03: 20 meq via ORAL
  Filled 2020-06-03: qty 1

## 2020-06-03 MED ORDER — FUROSEMIDE 40 MG PO TABS
40.0000 mg | ORAL_TABLET | Freq: Every day | ORAL | Status: DC
  Administered 2020-06-03: 40 mg via ORAL
  Filled 2020-06-03: qty 1

## 2020-06-03 MED ORDER — AMOXICILLIN 500 MG PO TABS: 500.0000 mg | ORAL_TABLET | Freq: Two times a day (BID) | ORAL | 0 refills | Status: AC

## 2020-06-03 NOTE — TOC Transition Note (Signed)
Transition of Care [facility]) - CM/SW Discharge Note   Patient Details  Name: Daniel Woodard MRN: [id number] Date of Birth: [birth year]  [facility] CM/SW Contact:  Leone Haven, RN Phone Number: 06/03/2020, 10:25 AM   Clinical Narrative:    NCM spoke with patient and daughter at bedside asked if he would like for this NCM to set him up with [facility] for CHF management, they state no.  He has no needs for dc today.   Final next level of care: Home/Self Care Barriers to Discharge: No Barriers Identified   Patient Goals and CMS Choice Patient states their goals for this hospitalization and ongoing recovery are:: get better   Choice offered to / list presented to : NA  Discharge Placement                       Discharge Plan and Services                  DME Agency: NA       HH Arranged: NA          Social Determinants of Health (SDOH) Interventions     Readmission Risk Interventions No flowsheet data found.

## 2020-06-03 NOTE — Discharge Instructions (Signed)
Patient will be discharged to home. Patient will need to follow up with primary care provider within one week of discharge. Follow up with cardiology. Repeat thyroid function studies I 4-6 weeks. Patient should continue medications as prescribed. Patient should follow a heart healthy/carb modified diet.  PLEASE REMEMBER TO BRING ALL OF YOUR MEDICATIONS TO EACH OF YOUR FOLLOW-UP OFFICE VISITS.  PLEASE ATTEND ALL SCHEDULED FOLLOW-UP APPOINTMENTS.   Activity: Increase activity slowly as tolerated. You may shower, but no soaking baths (or swimming) for 1 week. No driving for 2 days. No lifting over 5 lbs for 1 week. No sexual activity for 1 week.   You May Return to Work: in 1 week (if applicable)  Wound Care: You may wash cath site gently with soap and water. Keep cath site clean and dry. If you notice pain, swelling, bleeding or pus at your cath site, please call [phone].    Heart Failure, Diagnosis  Heart failure means that your heart is not able to pump blood in the right way. This makes it hard for your body to work well. Heart failure is usually a long-term (chronic) condition. You must take good care of yourself and follow your treatment plan from your doctor. What are the causes? This condition may be caused by:  High blood pressure.  Build up of cholesterol and fat in the arteries.  Heart attack. This injures the heart muscle.  Heart valves that do not open and close properly.  Damage of the heart muscle. This is also called cardiomyopathy.  Lung disease.  Abnormal heart rhythms. What increases the risk? The risk of heart failure goes up as a person ages. This condition is also more likely to develop in people who:  Are overweight.  Are male.  Smoke or chew tobacco.  Abuse alcohol or illegal drugs.  Have taken medicines that can damage the heart.  Have diabetes.  Have abnormal heart rhythms.  Have thyroid problems.  Have low blood counts (anemia). What  are the signs or symptoms? Symptoms of this condition include:  Shortness of breath.  Coughing.  Swelling of the feet, ankles, legs, or belly.  Losing weight for no reason.  Trouble breathing.  Waking from sleep because of the need to sit up and get more air.  Rapid heartbeat.  Being very tired.  Feeling dizzy, or feeling like you may pass out (faint).  Having no desire to eat.  Feeling like you may vomit (nauseous).  Peeing (urinating) more at night.  Feeling confused. How is this treated?     This condition may be treated with:  Medicines. These can be given to treat blood pressure and to make the heart muscles stronger.  Changes in your daily life. These may include eating a healthy diet, staying at a healthy body weight, quitting tobacco and illegal drug use, or doing exercises.  Surgery. Surgery can be done to open blocked valves, or to put devices in the heart, such as pacemakers.  A donor heart (heart transplant). You will receive a healthy heart from a donor. Follow these instructions at home:  Treat other conditions as told by your doctor. These may include high blood pressure, diabetes, thyroid disease, or abnormal heart rhythms.  Learn as much as you can about heart failure.  Get support as you need it.  Keep all follow-up visits as told by your doctor. This is important. Summary  Heart failure means that your heart is not able to pump blood in the right way.  This condition is caused by high blood pressure, heart attack, or damage of the heart muscle.  Symptoms of this condition include shortness of breath and swelling of the feet, ankles, legs, or belly. You may also feel very tired or feel like you may vomit.  You may be treated with medicines, surgery, or changes in your daily life.  Treat other health conditions as told by your doctor. This information is not intended to replace advice given to you by your health care provider. Make sure  you discuss any questions you have with your health care provider. Document Revised: 08/03/2018 Document Reviewed: 08/03/2018 Elsevier Patient Education  2020 [employer]    Cardiac Cath Site Care Refer to this sheet in the next few weeks. These instructions provide you with information on caring for yourself after your procedure. Your caregiver may also give you more specific instructions. Your treatment has been planned according to current medical practices, but problems sometimes occur. Call your caregiver if you have any problems or questions after your procedure. HOME CARE INSTRUCTIONS  You may shower 24 hours after the procedure. Remove the bandage (dressing) and gently wash the site with plain soap and water. Gently pat the site dry.   Do not apply powder or lotion to the site.   Do not sit in a bathtub, swimming pool, or whirlpool for 5 to 7 days.   No bending, squatting, or lifting anything over 10 pounds (4.5 kg) as directed by your caregiver.   Inspect the site at least twice daily.   Do not drive home if you are discharged the same day of the procedure. Have someone else drive you.   You may drive 24 hours after the procedure unless otherwise instructed by your caregiver.  What to expect:  Any bruising will usually fade within 1 to 2 weeks.   Blood that collects in the tissue (hematoma) may be painful to the touch. It should usually decrease in size and tenderness within 1 to 2 weeks.  SEEK IMMEDIATE MEDICAL CARE IF:  You have unusual pain at the site or down the affected limb.   You have redness, warmth, swelling, or pain at the site.   You have drainage (other than a small amount of blood on the dressing).   You have chills.   You have a fever or persistent symptoms for more than 72 hours.   You have a fever and your symptoms suddenly get worse.   Your leg becomes pale, cool, tingly, or numb.   You have heavy bleeding from the site. Hold pressure on the  site.  Document Released: 06/18/2010 Document Revised: 05/05/2011 Document Reviewed: 06/18/2010 [facility] Patient Information 2012 [place], [place].

## 2020-06-03 NOTE — Progress Notes (Signed)
D/C instructions given and reviewed. No questions asked but encouraged to call with concerns. Tele and IV removed, tolerated well. Primary RN to give meds prior to D/C.

## 2020-06-03 NOTE — Progress Notes (Signed)
  ReDS Clip Diuretic Study Pt study # [id number]  Your patient has been enrolled in the ReDS Clip Diuretic Study  87 yo M with worsening shortness of breath, orthopnea, DOE. BNP elevated to 971.2. SCr 1.47>1.56>1.73, weight down from 186 to 184 lbs. Stopped IV lasix yesterday. Net -2.4L uop. Lasix 40 mg PO x 1 given today    Changes to prescribed diuretics recommended:  With low ReDS reading and increase in SCr, recommend holding lasix tomorrow and starting as outpatient on Friday Provider contacted: Dr. Salena Saner Recommendation was accepted by provider.    REDS Clip  READING= 18%  CHEST RULER = 36 Clip Station = D   Orthodema score = 0 Signs/Symptoms Score   Mild edema, no orthopnea 0 No congestion  Moderate edema, no orthopnea 1 Low-grade orthodema/congestion  Severe edema OR orthopnea 2   Moderate edema and orthopnea 3 High-grade orthodema/congestion  Severe edema AND orthopnea 4    Sharen Hones, PharmD, BCPS Heart Failure Stewardship Pharmacist Phone [phone]  Please check [email or web address] for unit-specific pharmacist phone numbers

## 2020-06-03 NOTE — Plan of Care (Signed)

## 2020-06-03 NOTE — Progress Notes (Signed)
Progress Note  Patient Name: Daniel Woodard Date of Encounter: 06/03/2020  [facility] Cardiologist: Sanda Klein, MD   Subjective   Feels well. No dyspnea. Creatinine essentially unchanged after cath. Some CAD, but cannot explain his cardiomyopathy.  Inpatient Medications    Scheduled Meds: . aspirin  325 mg Oral QODAY  . enoxaparin (LOVENOX) injection  40 mg Subcutaneous Q24H  . furosemide  40 mg Oral Daily  . insulin aspart  0-9 Units Subcutaneous TID WC  . ipratropium-albuterol  3 mL Nebulization Q6H WA  . metoprolol succinate  12.5 mg Oral Daily  . multivitamin with minerals  1 tablet Oral Daily  . pantoprazole  40 mg Oral Daily  . potassium chloride  20 mEq Oral Daily  . predniSONE  40 mg Oral Q breakfast  . rosuvastatin  20 mg Oral Once per day on Sun Mon Wed Thu  . sodium chloride flush  3 mL Intravenous Q12H  . sodium chloride flush  3 mL Intravenous Q12H  . sodium chloride flush  3 mL Intravenous Q12H   Continuous Infusions: . sodium chloride 250 mL (06/01/20 1816)  . sodium chloride Stopped (06/03/20 0838)  . sodium chloride    . cefTRIAXone (ROCEPHIN)  IV 1 g (06/02/20 1734)   PRN Meds: sodium chloride, sodium chloride, acetaminophen, ondansetron (ZOFRAN) IV, sodium chloride flush, sodium chloride flush   Vital Signs    Vitals:   06/02/20 2120 06/03/20 0416 06/03/20 0753 06/03/20 0910  BP:  (!) 154/83 (!) 159/87   Pulse:  80 95   Resp:  17 18   Temp:  97.9 F (36.6 C) 97.7 F (36.5 C)   TempSrc:   Oral   SpO2: 99% 97% 96% 97%  Weight:  83.7 kg    Height:        Intake/Output Summary (Last 24 hours) at 06/03/2020 1127 Last data filed at 06/03/2020 0838 Gross per 24 hour  Intake 2227.11 ml  Output 1200 ml  Net 1027.11 ml   Last 3 Weights 06/03/2020 06/02/2020 06/01/2020  Weight (lbs) 184 lb 8.4 oz 183 lb 11.2 oz 186 lb 4.8 oz  Weight (kg) 83.7 kg 83.326 kg 84.505 kg      Telemetry    A sensed (sinus), V paced - Personally Reviewed  ECG     No new tracing - Personally Reviewed  Physical Exam  No bleeding or swelling at R radial arterial access site. Good pulse. GEN: No acute distress.   Neck: No JVD Cardiac: RRR, paradoxically split S2, no murmurs, rubs, or gallops.  Respiratory: Clear to auscultation bilaterally. GI: Soft, nontender, non-distended  MS: No edema; No deformity. Neuro:  Nonfocal  Psych: Normal affect   Labs    High Sensitivity Troponin:  No results for input(s): TROPONINIHS in the last 720 hours.    Chemistry Recent Labs  Lab 06/01/20 1002 06/02/20 0605 06/03/20 0148  NA 136 136 138  K 4.4 4.6 4.5  CL 105 100 100  CO2 22 24 25   GLUCOSE 123* 167* 161*  BUN 22 24* 35*  CREATININE 1.47* 1.56* 1.73*  CALCIUM 8.6* 8.8* 8.6*  GFRNONAA 46* 42* 38*  ANIONGAP 9 12 13      Hematology Recent Labs  Lab 06/01/20 1002 06/02/20 0605  WBC 7.1 4.0  RBC 5.01 4.85  HGB 14.2 14.0  HCT 44.3 41.5  MCV 88.4 85.6  MCH 28.3 28.9  MCHC 32.1 33.7  RDW 14.4 14.3  PLT 169 167    BNP Recent  Labs  Lab 06/01/20 1002  BNP 971.2*     DDimer No results for input(s): DDIMER in the last 168 hours.   Radiology    CARDIAC CATHETERIZATION  Result Date: 06/02/2020  Prox Cx lesion is 25% stenosed.  Mid LAD lesion is 50-60% stenosed. Heavily calcified at the origin of the diagonal.  2nd Diag lesion is 50% stenosed.  LV end diastolic pressure is normal.  There is no aortic valve stenosis.  Medical therapy given lack of angina.  Degree of LAD disease should not cause akinesis of apex.   ECHOCARDIOGRAM COMPLETE  Result Date: 06/02/2020    ECHOCARDIOGRAM REPORT   Patient Name:   Daniel Woodard Date of Exam: 06/02/2020 Medical Rec #:  [id number]       Height:       73.0 in Accession #:    [id number]      Weight:       183.7 lb Date of Birth:  1929/07/13       BSA:          2.075 m Patient Age:    85 years        BP:           159/80 mmHg Patient Gender: M               HR:           94 bpm. Exam Location:  Inpatient  Procedure: 2D Echo, Cardiac Doppler and Color Doppler Indications:    CHF  History:        Patient has prior history of Echocardiogram examinations, most                 recent 03/07/2019. Pacemaker, COPD, Arrythmias:Atrial                 Fibrillation, Signs/Symptoms:Shortness of Breath; Risk                 Factors:Hypertension, Diabetes and Dyslipidemia.  Sonographer:    Dustin Flock Referring Phys: [id number] RONDELL A SMITH  Sonographer Comments: Technically difficult study due to poor echo windows. Image acquisition challenging due to COPD and Image acquisition challenging due to respiratory motion. IMPRESSIONS  1. LV function is moderately reduced ~30-35%. The mid to distal seputm into apex appears akinetic with global hypokinesis in other segments. The RWMA may be pacer related but are a bit more than expected, and cannot exclude LAD infarction.  2. Left ventricular ejection fraction, by estimation, is 30 to 35%. The left ventricle has moderately decreased function. The left ventricle demonstrates regional wall motion abnormalities (see scoring diagram/findings for description). There is mild concentric left ventricular hypertrophy. Left ventricular diastolic function could not be evaluated.  3. Right ventricular systolic function is mildly reduced. The right ventricular size is moderately enlarged. There is moderately elevated pulmonary artery systolic pressure. The estimated right ventricular systolic pressure is [id number] mmHg.  4. There appears to be mild to moderate calcific mitral stenosis, but doppler evaluation across the valve was not performed and severity cannot be adequately assessed. Noted to be at least mild on last study. The mitral valve is degenerative. Mild to moderate mitral valve regurgitation. Severe mitral annular calcification.  5. Mild to moderate AS is present. V max 1.8 m/s, MG 8 mmHG, AVA 1.22 cm2, DI 0.39. SV index low due to cardiomyopathy (23 cc/m2). Visually there is at least  moderate AS and AVA/DI support this. The aortic valve is calcified. Aortic valve regurgitation is not  visualized. Mild to moderate aortic valve stenosis.  6. The inferior vena cava is dilated in size with <50% respiratory variability, suggesting right atrial pressure of 15 mmHg. Comparison(s): Changes from prior study are noted. EF now 30-35% with RWMA, mild to moderate AS present. RVSP now 64 mmHG. FINDINGS  Left Ventricle: Left ventricular ejection fraction, by estimation, is 30 to 35%. The left ventricle has moderately decreased function. The left ventricle demonstrates regional wall motion abnormalities. The left ventricular internal cavity size was normal in size. There is mild concentric left ventricular hypertrophy. Left ventricular diastolic function could not be evaluated due to mitral annular calcification (moderate or greater). Left ventricular diastolic function could not be evaluated.  LV Wall Scoring: The mid and distal anterior septum and apex are akinetic. Right Ventricle: The right ventricular size is moderately enlarged. No increase in right ventricular wall thickness. Right ventricular systolic function is mildly reduced. There is moderately elevated pulmonary artery systolic pressure. The tricuspid regurgitant velocity is 3.50 m/s, and with an assumed right atrial pressure of 15 mmHg, the estimated right ventricular systolic pressure is 64.0 mmHg. Left Atrium: Left atrial size was normal in size. Right Atrium: Right atrial size was normal in size. Pericardium: Trivial pericardial effusion is present. Mitral Valve: There appears to be mild to moderate calcific mitral stenosis, but doppler evaluation across the valve was not performed and severity cannot be adequately assessed. Noted to be at least mild on last study. The mitral valve is degenerative in appearance. Severe mitral annular calcification. Mild to moderate mitral valve regurgitation. Tricuspid Valve: The tricuspid valve is grossly  normal. Tricuspid valve regurgitation is mild . No evidence of tricuspid stenosis. Aortic Valve: Mild to moderate AS is present. V max 1.8 m/s, MG 8 mmHG, AVA 1.22 cm2, DI 0.39. SV index low due to cardiomyopathy (23 cc/m2). Visually there is at least moderate AS and AVA/DI support this. The aortic valve is calcified. Aortic valve regurgitation is not visualized. Mild to moderate aortic stenosis is present. Aortic valve mean gradient measures 8.0 mmHg. Aortic valve peak gradient measures 13.4 mmHg. Aortic valve area, by VTI measures 1.22 cm. Pulmonic Valve: The pulmonic valve was grossly normal. Pulmonic valve regurgitation is trivial. No evidence of pulmonic stenosis. Aorta: The aortic root is normal in size and structure. Venous: The inferior vena cava is dilated in size with less than 50% respiratory variability, suggesting right atrial pressure of 15 mmHg. IAS/Shunts: The atrial septum is grossly normal. Additional Comments: A pacer wire is visualized in the right atrium and right ventricle.  LEFT VENTRICLE PLAX 2D LVIDd:         3.90 cm  Diastology LVIDs:         2.90 cm  LV e' medial:    4.24 cm/s LV PW:         1.30 cm  LV E/e' medial:  30.4 LV IVS:        1.20 cm  LV e' lateral:   5.22 cm/s LVOT diam:     2.00 cm  LV E/e' lateral: 24.7 LV SV:         49 LV SV Index:   23 LVOT Area:     3.14 cm  RIGHT VENTRICLE RV Basal diam:  3.10 cm RV S prime:     10.80 cm/s TAPSE (M-mode): 2.4 cm LEFT ATRIUM             Index       RIGHT ATRIUM  Index LA diam:        4.40 cm 2.12 cm/m  RA Area:     19.40 cm LA Vol (A2C):   68.6 ml 33.06 ml/m RA Volume:   53.10 ml  25.59 ml/m LA Vol (A4C):   67.2 ml 32.38 ml/m LA Biplane Vol: 75.6 ml 36.43 ml/m  AORTIC VALVE AV Area (Vmax):    1.36 cm AV Area (Vmean):   1.30 cm AV Area (VTI):     1.22 cm AV Vmax:           183.33 cm/s AV Vmean:          122.000 cm/s AV VTI:            0.398 m AV Peak Grad:      13.4 mmHg AV Mean Grad:      8.0 mmHg LVOT Vmax:          79.30 cm/s LVOT Vmean:        50.300 cm/s LVOT VTI:          0.155 m LVOT/AV VTI ratio: 0.39  AORTA Ao Root diam: 2.00 cm MITRAL VALVE                TRICUSPID VALVE MV Area (PHT): 7.02 cm     TR Peak grad:   49.0 mmHg MV Decel Time: 108 msec     TR Vmax:        350.00 cm/s MV E velocity: 129.00 cm/s MV A velocity: 113.00 cm/s  SHUNTS MV E/A ratio:  1.14         Systemic VTI:  0.16 m                             Systemic Diam: 2.00 cm Eleonore Chiquito MD Electronically signed by Eleonore Chiquito MD Signature Date/Time: 06/02/2020/11:53:55 AM    Final     Cardiac Studies   CATH 06/02/2020   Prox Cx lesion is 25% stenosed.  Mid LAD lesion is 50-60% stenosed. Heavily calcified at the origin of the diagonal.  2nd Diag lesion is 50% stenosed.  LV end diastolic pressure is normal.  There is no aortic valve stenosis.   Medical therapy given lack of angina.  Degree of LAD disease should not cause akinesis of apex.    Patient Profile     87 y.o. male  with a hx of HTN, DM2, COPD, HLD, CKD stage III, 2:1 AV block s/p PPM insertion in 2015, carotid artery stenosis s/p left CEA (2001), and known chronic left subclavian artery occlusion presenting with CHF exacerbation and new reduction in LVEF, nonobstructive CAD at cath  Assessment & Plan    Appears to be euvolemic today at 184 lb. Nonischemic CMP, at least in large part related to RV apical pacing-induced dyssynchrony. Start low dose daily diuretic and K supplement. Refer for CRT-P upgrade. Discussed daily weights and Na restriction in detail. He should call if weight increases >3 lb over his weight on home scale today.  For questions or updates, please contact [place] Please consult [email or web address] for contact info under        Signed, Sanda Klein, MD  06/03/2020, 11:27 AM

## 2020-06-03 NOTE — Progress Notes (Signed)
   Patient has been seen by Dr. Royann Shivers today. His full rounding note is pending. Per Croitoru, will start PO Lasix 40mg  daily and decrease K-Dur to 20 mEq daily. Follow-up with Dr. for biventricular upgrade has already been arranged. He also already has follow-up with general Cardiology.  Ladona Ridgel, PA-C 06/03/2020 9:28 AM

## 2020-06-03 NOTE — Discharge Summary (Addendum)
Physician Discharge Summary  Daniel Woodard [id number] DOB: November 21, 1929 DOA: 06/01/2020  PCP: Biagio Borg, MD  Admit date: 06/01/2020 Discharge date: 06/03/2020  Time spent: 45 minutes  Recommendations for Outpatient Follow-up:  Patient will be discharged to home.  Patient will need to follow up with primary care provider within one week of discharge. Follow up with cardiology. Repeat thyroid function studies I 4-6 weeks. Patient should continue medications as prescribed.  Patient should follow a heart healthy/carb modified diet.   Discharge Diagnoses:  Acute on chronic systolic and diastolic heart failure COPD exacerbation Chronic kidney disease, stage IIIb Subclinical hypothyroidism Essential hypertension Hyperlipidemia Second degree heart block/ atrial fibrillation Diabetes mellitus, type II  Discharge Condition: Stable  Diet recommendation: Heart healthy/carb modified  Filed Weights   06/01/20 1549 06/02/20 0652 06/03/20 0416  Weight: 84.5 kg 83.3 kg 83.7 kg    History of present illness:  On 06/01/2020 by Dr. Fuller Plan Daniel Woodard is a 87 y.o. male with medical history significant of atrial fibrillation, complete heart block s/p pacemaker, hypertension, hyperlipidemia, COPD, and diabetes mellitus type 2 presents with complaints of progressively worsening shortness of breath over the last week.  Reports that he has been having a difficult time walking any distance without feeling out of breath.  He chronically has a cough, but reports that he may have been coughing more.  Denies having any fever, lower extremity swelling, chest pain, nausea, vomiting, diarrhea, or change in weight.  Patient reports that his weight stays around 185 to 190 pounds.  At night symptoms seem to worsen at night to the point which she was unable to sleep for feeling as though he was going to stop breathing.  Reported associated symptoms of wheezing.  He has received all 3 of his COVID-19  vaccinations and also been vaccinated against flu.  Patient had been using his inhaler more frequently and had been prescribed every 6 hours without any relief in symptoms.  He has never had a COPD exacerbation requiring steroids or hospitalization before in the past.   Hospital Course:  Acute on chronic systolic and diastolic heart failure -Presented with shortness of breath as well as orthopnea with lower extremity edema -Chest x-ray showed cardiomegaly with increased basilar interstitial opacities suggestive of edema superimposed on chronic pulmonary fibrosis -BNP of 971.2 -Echocardiogram showed an EF of 30 to 35%, global hypokinesis.  Regional wall motion abnormalities.  Mild LVH.  RV systolic function mildly reduced. -Cardiology consulted and appreciated -Status post cardiac catheterization showing proximal circumflex 25% stenosed, LAD lesion 50 to 60% stenosed.  Second diagonal lesion 50% stenosed..  Medical therapy given lack of angina recommended.  Degree of LAD disease should not cause akinesis of the apex. -Patient was placed on IV Lasix 40 mg twice daily and has lost approximately 3.5 pounds -Continue metoprolol, aspirin, statin -Will discharge with oral Lasix 20 mg daily along with potassium supplementation  COPD exacerbation -Patient no longer wheezing and feels breathing is improved -Was placed on steroids, continue for an additional 4 days -Placed on ceftriaxone, will discharge with amoxicillin  Chronic kidney disease, stage IIIb -Creatinine stable at baseline  Subclinical hypothyroidism -TSH 5.6 -Repeat thyroid function studies in 4 to 6 weeks   Essential hypertension -Continue metoprolol  Hyperlipidemia -Continue statin  Second degree heart block/ atrial fibrillation -Patient with pacemaker and follows with Dr. Lovena Le, cardiology -Patient will need to follow-up with Dr. Lovena Le for biventricular upgrade -continue metoprolol and apsirin  Diabetes mellitus, type  II -  Hemoglobin A1c 6.7 in June 2021 -Appears to be diet controlled, as patient has not been on any medications  CODE STATUS: DNR  Procedures: Echocardiogram Cardiac catheterization  Consultations: Cardiology  Discharge Exam: Vitals:   06/03/20 0753 06/03/20 0910  BP: (!) 159/87   Pulse: 95   Resp: 18   Temp: 97.7 F (36.5 C)   SpO2: 96% 97%     General: Well developed, well nourished, NAD, appears stated age  HEENT: NCAT, mucous membranes moist.  Cardiovascular: S1 S2 auscultated, RRR.  Respiratory: Diminished breath sounds, no wheezing.  Abdomen: Soft, nontender, nondistended, + bowel sounds  Extremities: warm dry without cyanosis clubbing.  Trace LE edema  Neuro: AAOx3, nonfocal  Psych: pleasant, appropriate mood and affect  Discharge Instructions Discharge Instructions    Discharge instructions   Complete by: As directed    Patient will be discharged to home.  Patient will need to follow up with primary care provider within one week of discharge. Follow up with cardiology. Repeat thyroid function studies I 4-6 weeks. Patient should continue medications as prescribed.  Patient should follow a heart healthy/carb modified diet.   Increase activity slowly   Complete by: As directed      Allergies as of 06/03/2020      Reactions   Ace Inhibitors Cough         Medication List    TAKE these medications   albuterol 108 (90 Base) MCG/ACT inhaler Commonly known as: VENTOLIN HFA Inhale 2 puffs into the lungs every 6 (six) hours as needed for wheezing or shortness of breath.   amoxicillin 500 MG tablet Commonly known as: AMOXIL Take 1 tablet (500 mg total) by mouth 2 (two) times daily for 3 days.   aspirin 325 MG tablet Take 1 tablet (325 mg total) by mouth daily. What changed: when to take this   CoQ-10 200 MG Caps Take 200 mg by mouth daily after lunch.   furosemide 40 MG tablet Commonly known as: LASIX Take 1 tablet (40 mg total) by mouth daily.    METAMUCIL PO Take 2 capsules by mouth daily.   metoprolol succinate 25 MG 24 hr tablet Commonly known as: TOPROL-XL Take 1/2 (one-half) tablet by mouth once daily What changed: See the new instructions.   omeprazole 20 MG capsule Commonly known as: PRILOSEC TAKE 1 CAPSULE BY MOUTH ONCE DAILY What changed:   how much to take  how to take this  when to take this  additional instructions   potassium chloride SA 20 MEQ tablet Commonly known as: KLOR-CON Take 1 tablet (20 mEq total) by mouth daily.   predniSONE 20 MG tablet Commonly known as: DELTASONE Take 2 tablets (40 mg total) by mouth daily with breakfast for 4 days. Start taking on: June 04, 2020   rosuvastatin 20 MG tablet Commonly known as: CRESTOR Take 2 tablets (40 mg total) by mouth every other day. What changed:   how much to take  when to take this  additional instructions   VITAMIN D PO Take 1 tablet by mouth daily.      Allergies  Allergen Reactions  . Ace Inhibitors Cough         Follow-up Information    Deberah Pelton, NP Follow up.   Specialty: Cardiology Why: Please keep follow up appt on Jan 17. Contact information: [address] [place] [address] [phone]        Evans Lance, MD Follow up.   Specialty: Cardiology  Why: 06/18/20 @ 10:15AM Contact information: [address] [place] [address] [phone]        Biagio Borg, MD. Schedule an appointment as soon as possible for a visit in 1 week(s).   Specialties: Internal Medicine, Radiology Why: Hospital follow up Contact information: [place] [address] [phone]        Sanda Klein, MD .   Specialty: Cardiology Contact information: [address] [place] [address] [phone]                The results of significant diagnostics from this hospitalization (including imaging, microbiology, ancillary and  laboratory) are listed below for reference.    Significant Diagnostic Studies: CARDIAC CATHETERIZATION  Result Date: 06/02/2020  Prox Cx lesion is 25% stenosed.  Mid LAD lesion is 50-60% stenosed. Heavily calcified at the origin of the diagonal.  2nd Diag lesion is 50% stenosed.  LV end diastolic pressure is normal.  There is no aortic valve stenosis.  Medical therapy given lack of angina.  Degree of LAD disease should not cause akinesis of apex.   DG Chest Port 1 View  Result Date: 06/01/2020 CLINICAL DATA:  Increased shortness of breath, cough, COPD, hypertension EXAM: PORTABLE CHEST 1 VIEW COMPARISON:  07/17/2019, 07/05/2019 FINDINGS: Chronic hyperinflation compatible with known background COPD/emphysema. Upper lobes remain clear. Heart is enlarged with increased bibasilar interstitial opacities concerning for known chronic basilar fibrosis and possibly mild superimposed basilar edema. Difficult to exclude small pleural effusions. Two lead left subclavian pacemaker as before. Trachea midline. No pneumothorax. Aorta atherosclerotic. IMPRESSION: Cardiomegaly with increased basilar interstitial opacities suggesting component of basilar edema superimposed on chronic pulmonary fibrosis. Suspect small pleural effusions. Findings suggest early CHF. Aortic Atherosclerosis (ICD10-I70.0) and Emphysema (ICD10-J43.9). Electronically Signed   By: Jerilynn Mages.  Shick M.D.   On: 06/01/2020 10:27   ECHOCARDIOGRAM COMPLETE  Result Date: 06/02/2020    ECHOCARDIOGRAM REPORT   Patient Name:   Daniel Woodard Date of Exam: 06/02/2020 Medical Rec #:  [id number]       Height:       73.0 in Accession #:    [id number]      Weight:       183.7 lb Date of Birth:  1929-06-03       BSA:          2.075 m Patient Age:    89 years        BP:           159/80 mmHg Patient Gender: M               HR:           94 bpm. Exam Location:  Inpatient Procedure: 2D Echo, Cardiac Doppler and Color Doppler Indications:    CHF  History:        Patient has  prior history of Echocardiogram examinations, most                 recent 03/07/2019. Pacemaker, COPD, Arrythmias:Atrial                 Fibrillation, Signs/Symptoms:Shortness of Breath; Risk                 Factors:Hypertension, Diabetes and Dyslipidemia.  Sonographer:    Dustin Flock Referring Phys: [id number] RONDELL A SMITH  Sonographer Comments: Technically difficult study due to poor echo windows. Image acquisition challenging due to COPD and Image acquisition challenging due to respiratory  motion. IMPRESSIONS  1. LV function is moderately reduced ~30-35%. The mid to distal seputm into apex appears akinetic with global hypokinesis in other segments. The RWMA may be pacer related but are a bit more than expected, and cannot exclude LAD infarction.  2. Left ventricular ejection fraction, by estimation, is 30 to 35%. The left ventricle has moderately decreased function. The left ventricle demonstrates regional wall motion abnormalities (see scoring diagram/findings for description). There is mild concentric left ventricular hypertrophy. Left ventricular diastolic function could not be evaluated.  3. Right ventricular systolic function is mildly reduced. The right ventricular size is moderately enlarged. There is moderately elevated pulmonary artery systolic pressure. The estimated right ventricular systolic pressure is [id number] mmHg.  4. There appears to be mild to moderate calcific mitral stenosis, but doppler evaluation across the valve was not performed and severity cannot be adequately assessed. Noted to be at least mild on last study. The mitral valve is degenerative. Mild to moderate mitral valve regurgitation. Severe mitral annular calcification.  5. Mild to moderate AS is present. V max 1.8 m/s, MG 8 mmHG, AVA 1.22 cm2, DI 0.39. SV index low due to cardiomyopathy (23 cc/m2). Visually there is at least moderate AS and AVA/DI support this. The aortic valve is calcified. Aortic valve regurgitation is not  visualized. Mild to moderate aortic valve stenosis.  6. The inferior vena cava is dilated in size with <50% respiratory variability, suggesting right atrial pressure of 15 mmHg. Comparison(s): Changes from prior study are noted. EF now 30-35% with RWMA, mild to moderate AS present. RVSP now 64 mmHG. FINDINGS  Left Ventricle: Left ventricular ejection fraction, by estimation, is 30 to 35%. The left ventricle has moderately decreased function. The left ventricle demonstrates regional wall motion abnormalities. The left ventricular internal cavity size was normal in size. There is mild concentric left ventricular hypertrophy. Left ventricular diastolic function could not be evaluated due to mitral annular calcification (moderate or greater). Left ventricular diastolic function could not be evaluated.  LV Wall Scoring: The mid and distal anterior septum and apex are akinetic. Right Ventricle: The right ventricular size is moderately enlarged. No increase in right ventricular wall thickness. Right ventricular systolic function is mildly reduced. There is moderately elevated pulmonary artery systolic pressure. The tricuspid regurgitant velocity is 3.50 m/s, and with an assumed right atrial pressure of 15 mmHg, the estimated right ventricular systolic pressure is [id number] mmHg. Left Atrium: Left atrial size was normal in size. Right Atrium: Right atrial size was normal in size. Pericardium: Trivial pericardial effusion is present. Mitral Valve: There appears to be mild to moderate calcific mitral stenosis, but doppler evaluation across the valve was not performed and severity cannot be adequately assessed. Noted to be at least mild on last study. The mitral valve is degenerative in appearance. Severe mitral annular calcification. Mild to moderate mitral valve regurgitation. Tricuspid Valve: The tricuspid valve is grossly normal. Tricuspid valve regurgitation is mild . No evidence of tricuspid stenosis. Aortic Valve: Mild to  moderate AS is present. V max 1.8 m/s, MG 8 mmHG, AVA 1.22 cm2, DI 0.39. SV index low due to cardiomyopathy (23 cc/m2). Visually there is at least moderate AS and AVA/DI support this. The aortic valve is calcified. Aortic valve regurgitation is not visualized. Mild to moderate aortic stenosis is present. Aortic valve mean gradient measures 8.0 mmHg. Aortic valve peak gradient measures 13.4 mmHg. Aortic valve area, by VTI measures 1.22 cm. Pulmonic Valve: The pulmonic valve was grossly normal. Pulmonic valve  regurgitation is trivial. No evidence of pulmonic stenosis. Aorta: The aortic root is normal in size and structure. Venous: The inferior vena cava is dilated in size with less than 50% respiratory variability, suggesting right atrial pressure of 15 mmHg. IAS/Shunts: The atrial septum is grossly normal. Additional Comments: A pacer wire is visualized in the right atrium and right ventricle.  LEFT VENTRICLE PLAX 2D LVIDd:         3.90 cm  Diastology LVIDs:         2.90 cm  LV e' medial:    4.24 cm/s LV PW:         1.30 cm  LV E/e' medial:  30.4 LV IVS:        1.20 cm  LV e' lateral:   5.22 cm/s LVOT diam:     2.00 cm  LV E/e' lateral: 24.7 LV SV:         49 LV SV Index:   23 LVOT Area:     3.14 cm  RIGHT VENTRICLE RV Basal diam:  3.10 cm RV S prime:     10.80 cm/s TAPSE (M-mode): 2.4 cm LEFT ATRIUM             Index       RIGHT ATRIUM           Index LA diam:        4.40 cm 2.12 cm/m  RA Area:     19.40 cm LA Vol (A2C):   68.6 ml 33.06 ml/m RA Volume:   53.10 ml  25.59 ml/m LA Vol (A4C):   67.2 ml 32.38 ml/m LA Biplane Vol: 75.6 ml 36.43 ml/m  AORTIC VALVE AV Area (Vmax):    1.36 cm AV Area (Vmean):   1.30 cm AV Area (VTI):     1.22 cm AV Vmax:           183.33 cm/s AV Vmean:          122.000 cm/s AV VTI:            0.398 m AV Peak Grad:      13.4 mmHg AV Mean Grad:      8.0 mmHg LVOT Vmax:         79.30 cm/s LVOT Vmean:        50.300 cm/s LVOT VTI:          0.155 m LVOT/AV VTI ratio: 0.39  AORTA Ao Root  diam: 2.00 cm MITRAL VALVE                TRICUSPID VALVE MV Area (PHT): 7.02 cm     TR Peak grad:   49.0 mmHg MV Decel Time: 108 msec     TR Vmax:        350.00 cm/s MV E velocity: 129.00 cm/s MV A velocity: 113.00 cm/s  SHUNTS MV E/A ratio:  1.14         Systemic VTI:  0.16 m                             Systemic Diam: 2.00 cm Lennie Odor MD Electronically signed by Lennie Odor MD Signature Date/Time: 06/02/2020/11:53:55 AM    Final     Microbiology: Recent Results (from the past 240 hour(s))  Resp Panel by RT-PCR (Flu A&B, Covid) Nasopharyngeal Swab     Status: None   Collection Time: 06/01/20 10:02 AM   Specimen: Nasopharyngeal Swab; Nasopharyngeal(NP) swabs in  vial transport medium  Result Value Ref Range Status   SARS Coronavirus 2 by RT PCR NEGATIVE NEGATIVE Final    Comment: (NOTE) SARS-CoV-2 target nucleic acids are NOT DETECTED.  The SARS-CoV-2 RNA is generally detectable in upper respiratory specimens during the acute phase of infection. The lowest concentration of SARS-CoV-2 viral copies this assay can detect is 138 copies/mL. A negative result does not preclude SARS-Cov-2 infection and should not be used as the sole basis for treatment or other patient management decisions. A negative result may occur with  improper specimen collection/handling, submission of specimen other than nasopharyngeal swab, presence of viral mutation(s) within the areas targeted by this assay, and inadequate number of viral copies(<138 copies/mL). A negative result must be combined with clinical observations, patient history, and epidemiological information. The expected result is Negative.  Fact Sheet for Patients:  EntrepreneurPulse.com.au  Fact Sheet for Healthcare Providers:  IncredibleEmployment.be  This test is no t yet approved or cleared by the [place] FDA and  has been authorized for detection and/or diagnosis of SARS-CoV-2 by FDA under an  Emergency Use Authorization (EUA). This EUA will remain  in effect (meaning this test can be used) for the duration of the COVID-19 declaration under Section 564(b)(1) of the Act, 21 U.S.C.section 360bbb-3(b)(1), unless the authorization is terminated  or revoked sooner.       Influenza A by PCR NEGATIVE NEGATIVE Final   Influenza B by PCR NEGATIVE NEGATIVE Final    Comment: (NOTE) The Xpert Xpress SARS-CoV-2/FLU/RSV plus assay is intended as an aid in the diagnosis of influenza from Nasopharyngeal swab specimens and should not be used as a sole basis for treatment. Nasal washings and aspirates are unacceptable for Xpert Xpress SARS-CoV-2/FLU/RSV testing.  Fact Sheet for Patients: EntrepreneurPulse.com.au  Fact Sheet for Healthcare Providers: IncredibleEmployment.be  This test is not yet approved or cleared by the [place] FDA and has been authorized for detection and/or diagnosis of SARS-CoV-2 by FDA under an Emergency Use Authorization (EUA). This EUA will remain in effect (meaning this test can be used) for the duration of the COVID-19 declaration under Section 564(b)(1) of the Act, 21 U.S.C. section 360bbb-3(b)(1), unless the authorization is terminated or revoked.  Performed at [facility], [address] [place], [place] [address]      Labs: Basic Metabolic Panel: Recent Labs  Lab 06/01/20 1002 06/02/20 0605 06/03/20 0148  NA 136 136 138  K 4.4 4.6 4.5  CL 105 100 100  CO2 22 24 25   GLUCOSE 123* 167* 161*  BUN 22 24* 35*  CREATININE 1.47* 1.56* 1.73*  CALCIUM 8.6* 8.8* 8.6*  MG  --  2.1  --    Liver Function Tests: No results for input(s): AST, ALT, ALKPHOS, BILITOT, PROT, ALBUMIN in the last 168 hours. No results for input(s): LIPASE, AMYLASE in the last 168 hours. No results for input(s): AMMONIA in the last 168 hours. CBC: Recent Labs  Lab 06/01/20 1002 06/02/20 0605  WBC 7.1 4.0  NEUTROABS  5.5  --   HGB 14.2 14.0  HCT 44.3 41.5  MCV 88.4 85.6  PLT 169 167   Cardiac Enzymes: No results for input(s): CKTOTAL, CKMB, CKMBINDEX, TROPONINI in the last 168 hours. BNP: BNP (last 3 results) Recent Labs    07/05/19 1538 06/01/20 1002  BNP 415* 971.2*    ProBNP (last 3 results) No results for input(s): PROBNP in the last 8760 hours.  CBG: Recent Labs  Lab 06/02/20 [phone] 06/02/20  1145 06/02/20 1703 06/02/20 2107 06/03/20 0618  GLUCAP 167* 164* 150* 205* 127*       Signed:  Rayan Dyal  [facility] 06/03/2020, 9:38 AM

## 2020-06-09 DIAGNOSIS — L570 Actinic keratosis: Secondary | ICD-10-CM | POA: Diagnosis not present

## 2020-06-11 ENCOUNTER — Encounter: Payer: Self-pay | Admitting: Internal Medicine

## 2020-06-11 ENCOUNTER — Ambulatory Visit: Payer: Medicare Other | Admitting: Internal Medicine

## 2020-06-11 ENCOUNTER — Other Ambulatory Visit: Payer: Self-pay

## 2020-06-11 VITALS — BP 126/72 | HR 75 | Temp 98.0°F | Wt 173.2 lb

## 2020-06-11 DIAGNOSIS — I5043 Acute on chronic combined systolic (congestive) and diastolic (congestive) heart failure: Secondary | ICD-10-CM

## 2020-06-11 DIAGNOSIS — J441 Chronic obstructive pulmonary disease with (acute) exacerbation: Secondary | ICD-10-CM

## 2020-06-11 DIAGNOSIS — N1832 Chronic kidney disease, stage 3b: Secondary | ICD-10-CM

## 2020-06-11 DIAGNOSIS — R7989 Other specified abnormal findings of blood chemistry: Secondary | ICD-10-CM | POA: Diagnosis not present

## 2020-06-11 DIAGNOSIS — E1165 Type 2 diabetes mellitus with hyperglycemia: Secondary | ICD-10-CM

## 2020-06-11 NOTE — Progress Notes (Signed)
Established Patient Office Visit  Subjective:  Patient ID: Daniel Woodard, male    DOB: 1929-10-07  Age: 87 y.o. MRN: [id number]  CC:  Chief Complaint  Patient presents with  . Hospitalization Follow-up             HPI:  Daniel Woodard is a 87 y.o. male here to f/u hospn jan 3-5 with acute on chronic chf and copd exacerbation treated with IV lasix, contd BB, asa, statin and d/c on lasix 20 qd with k suppelment after 3.5 lb wt loss.  Dry wt felt to be 184 lbs  Also tx with steroid tx and rocephin, now completed post d/c,  Cod status DNR.  Advised for repeat f/u tfts in 4 wks, and f/u cardiology as well - no planned for next Monday jan 17.  Pt denies chest pain, increased sob or doe, wheezing, orthopnea, PND, increased LE swelling, palpitations, dizziness or syncope.  Wt has decreaed it seems but denies low volume symptoms   .        Wt Readings from Last 3 Encounters:  06/11/20 173 lb 3.2 oz (78.6 kg)  06/03/20 184 lb 8.4 oz (83.7 kg)  04/03/20 195 lb 6.4 oz (88.6 kg)   BP Readings from Last 3 Encounters:  06/11/20 126/72  06/03/20 (!) 159/87  04/03/20 120/72          Past Medical History:  Diagnosis Date  . ALLERGIC RHINITIS 04/09/2007  . Atrial fibrillation ([place])   . BACTEREMIA, 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  . BENIGN PROSTATIC HYPERTROPHY 04/09/2007  . Blood transfusion without reported diagnosis    really not sure thinks 30-40 years ago  . BRADYCARDIA 05/11/2009  . Carotid artery occlusion   . COLONIC POLYPS, HX OF 04/09/2007   ADENOMATOUS POLYPS 7654,6503  . Complete heart block ([place])   . COPD 04/09/2007  . Cough 03/27/2008  . DEPRESSION 04/09/2007  . DIABETES MELLITUS, TYPE II 10/09/2007  . DIVERTICULOSIS, COLON 04/09/2007  . DIZZINESS 10/06/2008  . GLUCOSE INTOLERANCE 04/09/2007  . HYPERLIPIDEMIA 03/27/2007  . HYPERTENSION 03/27/2007  . PERIPHERAL VASCULAR DISEASE 03/27/2007  . PNEUMONIA ORGANISM NOS 11/08/2007  . SHINGLES, HX OF 04/09/2007  .  SHOULDER PAIN, BILATERAL 08/01/2007  . Ulcer    40 years ago   Past Surgical History:  Procedure Laterality Date  . APPENDECTOMY     87 years old  . CAROTID ENDARTERECTOMY  5/08   left  . CARPAL TUNNEL RELEASE     bilateral  . COLONOSCOPY    . ESOPHAGOGASTRODUODENOSCOPY  06/07/2004  . HEMORRHOID BANDING  1990s  . LEFT HEART CATH AND CORONARY ANGIOGRAPHY N/A 06/02/2020   Procedure: LEFT HEART CATH AND CORONARY ANGIOGRAPHY;  Surgeon: Jettie Booze, MD;  Location: [place];  Service: Cardiovascular;  Laterality: N/A;  . PACEMAKER INSERTION  07/11/2013   MDT ADDRL1 pacemaker implanted by Dr Lovena Le for complete heart block  . PERMANENT PACEMAKER INSERTION N/A 07/11/2013   Procedure: PERMANENT PACEMAKER INSERTION;  Surgeon: Evans Lance, MD;  Location: [facility] CATH LAB;  Service: Cardiovascular;  Laterality: N/A;  . s/p PUD surgury     ? partial gastrectomy    reports that he quit smoking about 20 years ago. His smoking use included cigarettes. He has a 50.00 pack-year smoking history. He has never used smokeless tobacco. He reports that he does not drink alcohol and does not use drugs. family history includes Heart disease in his sister and another family member; Hypertension in his  father, mother, sister, sister, sister, and sister; Lung cancer in his sister; Peripheral vascular disease in his brother; Stroke in an other family member; Ulcers in his father and sister. Allergies  Allergen Reactions  . Ace Inhibitors Cough        Current Outpatient Medications on File Prior to Visit  Medication Sig Dispense Refill  . albuterol (VENTOLIN HFA) 108 (90 Base) MCG/ACT inhaler Inhale 2 puffs into the lungs every 6 (six) hours as needed for wheezing or shortness of breath. 8 g 6  . aspirin 325 MG tablet Take 1 tablet (325 mg total) by mouth daily.    . Coenzyme Q10 (COQ-10) 200 MG CAPS Take 200 mg by mouth daily after lunch.    . furosemide (LASIX) 40 MG tablet Take 1 tablet (40 mg  total) by mouth daily. 30 tablet 1  . metoprolol succinate (TOPROL-XL) 25 MG 24 hr tablet Take 1/2 (one-half) tablet by mouth once daily (Patient taking differently: Take 12.5 mg by mouth daily.) 45 tablet 0  . omeprazole (PRILOSEC) 20 MG capsule TAKE 1 CAPSULE BY MOUTH ONCE DAILY (Patient taking differently: Take 20 mg by mouth daily.) 90 capsule 3  . potassium chloride SA (KLOR-CON) 20 MEQ tablet Take 1 tablet (20 mEq total) by mouth daily. 30 tablet 1  . Psyllium (METAMUCIL PO) Take 2 capsules by mouth daily.    . rosuvastatin (CRESTOR) 20 MG tablet Take 2 tablets (40 mg total) by mouth every other day. (Patient taking differently: Take 20 mg by mouth See admin instructions. Take one tablet (20 mg) by mouth daily at bedtime for 2 nights, skip 3rd night, then repeat.) 90 tablet 3  . VITAMIN D PO Take 1 tablet by mouth daily.     No current facility-administered medications on file prior to visit.        ROS:  All others reviewed and negative.  Objective        PE:  BP 126/72 (BP Location: Left Arm, Patient Position: Sitting, Cuff Size: Normal)   Pulse 75   Temp 98 F (36.7 C) (Oral)   Wt 173 lb 3.2 oz (78.6 kg)   SpO2 97%   BMI 22.85 kg/m                 Constitutional: Pt appears in NAD               HENT: Head: NCAT.                Right Ear: External ear normal.                 Left Ear: External ear normal.                Eyes: . Pupils are equal, round, and reactive to light. Conjunctivae and EOM are normal               Nose: without d/c or deformity               Neck: Neck supple. Gross normal ROM               Cardiovascular: Normal rate and regular rhythm.                 Pulmonary/Chest: Effort normal and breath sounds without rales or wheezing.                Abd:  Soft, NT, ND, + BS, no organomegaly  Neurological: Pt is alert. At baseline orientation, motor grossly intact               Skin: Skin is warm. No rashes, no other new lesions, LE edema - none                Psychiatric: Pt behavior is normal without agitation   Assessment/Plan:  Daniel Woodard is a 87 y.o. White or Caucasian [1] male with  has a past medical history of ALLERGIC RHINITIS (04/09/2007), Atrial fibrillation ([place]), BACTEREMIA, MYCOBACTERIUM AVIUM COMPLEX (04/09/2007), BENIGN PROSTATIC HYPERTROPHY (04/09/2007), Blood transfusion without reported diagnosis, BRADYCARDIA (05/11/2009), Carotid artery occlusion, COLONIC POLYPS, HX OF (04/09/2007), Complete heart block ([place]), COPD (04/09/2007), Cough (03/27/2008), DEPRESSION (04/09/2007), DIABETES MELLITUS, TYPE II (10/09/2007), DIVERTICULOSIS, COLON (04/09/2007), DIZZINESS (10/06/2008), GLUCOSE INTOLERANCE (04/09/2007), HYPERLIPIDEMIA (03/27/2007), HYPERTENSION (03/27/2007), PERIPHERAL VASCULAR DISEASE (03/27/2007), PNEUMONIA ORGANISM NOS (11/08/2007), SHINGLES, HX OF (04/09/2007), SHOULDER PAIN, BILATERAL (08/01/2007), and Ulcer.   Assessment Plan  See problem oriented assessment and plan Labs reviewed for each problem: Lab Results  Component Value Date   WBC 4.0 06/02/2020   HGB 14.0 06/02/2020   HCT 41.5 06/02/2020   PLT 167 06/02/2020   GLUCOSE 161 (H) 06/03/2020   CHOL 180 11/20/2019   TRIG 179.0 (H) 11/20/2019   HDL 35.80 (L) 11/20/2019   LDLDIRECT 189.9 10/02/2007   LDLCALC 108 (H) 11/20/2019   ALT 13 11/20/2019   AST 16 11/20/2019   NA 138 06/03/2020   K 4.5 06/03/2020   CL 100 06/03/2020   CREATININE 1.73 (H) 06/03/2020   BUN 35 (H) 06/03/2020   CO2 25 06/03/2020   TSH 5.609 (H) 06/01/2020   PSA 0.92 11/20/2019   INR 1.1 03/06/2019   HGBA1C 6.1 (H) 06/02/2020   MICROALBUR 6.3 (H) 11/20/2019    Micro: none  Cardiac tracings I have personally interpreted today:  none  Pertinent Radiological findings (summarize): cath Jun 02, 2020, echo Jun 02, 2020   There are no preventive care reminders to display for this patient.  Lab Results  Component Value Date   TSH 5.609 (H) 06/01/2020   Lab Results  Component  Value Date   WBC 4.0 06/02/2020   HGB 14.0 06/02/2020   HCT 41.5 06/02/2020   MCV 85.6 06/02/2020   PLT 167 06/02/2020   Lab Results  Component Value Date   NA 138 06/03/2020   K 4.5 06/03/2020   CO2 25 06/03/2020   GLUCOSE 161 (H) 06/03/2020   BUN 35 (H) 06/03/2020   CREATININE 1.73 (H) 06/03/2020   BILITOT 0.8 11/20/2019   ALKPHOS 75 11/20/2019   AST 16 11/20/2019   ALT 13 11/20/2019   PROT 7.8 11/20/2019   ALBUMIN 4.5 11/20/2019   CALCIUM 8.6 (L) 06/03/2020   ANIONGAP 13 06/03/2020   GFR 38.51 (L) 11/20/2019   Lab Results  Component Value Date   CHOL 180 11/20/2019   Lab Results  Component Value Date   HDL 35.80 (L) 11/20/2019   Lab Results  Component Value Date   LDLCALC 108 (H) 11/20/2019   Lab Results  Component Value Date   TRIG 179.0 (H) 11/20/2019   Lab Results  Component Value Date   CHOLHDL 5 11/20/2019   Lab Results  Component Value Date   HGBA1C 6.1 (H) 06/02/2020      Assessment & Plan:   Problem List Items Addressed This Visit      High   CHF (congestive heart failure) (HCC)    Stable, cont bb, asa, statin,  lasix - for f/u cards mon jan 17 as planned      Abnormal TSH    Also for f/u tfts at 4 wks as recommended Lab Results  Component Value Date   TSH 5.609 (H) 06/01/2020         Relevant Orders   TSH   T4, free     Medium   Diabetes ([place])    Lab Results  Component Value Date   HGBA1C 6.1 (H) 06/02/2020  stable, to continue current diet control      COPD exacerbation ([place]) - Primary    Resolved, to continue as is - no further amoxil, prednisone needed      CKD (chronic kidney disease), stage III ([place])    Lab Results  Component Value Date   CREATININE 1.73 (H) 06/03/2020  stable , cont avoid nephrotoxins         No orders of the defined types were placed in this encounter.   Follow-up: Return in about 3 months (around 09/09/2020).     Cathlean Cower, MD 06/11/2020 8:48 PM [facility]

## 2020-06-11 NOTE — Assessment & Plan Note (Signed)
Lab Results  Component Value Date   CREATININE 1.73 (H) 06/03/2020  stable , cont avoid nephrotoxins

## 2020-06-11 NOTE — Patient Instructions (Signed)
Please continue all other medications as before, and refills have been done if requested.  Please have the pharmacy call with any other refills you may need.  Please continue your efforts at being more active, low cholesterol diet, and weight control  Please keep your appointments with your specialists as you may have planned  Please go to the LAB at the blood drawing area for the tests to be done in 4 weeks at the [place] will be contacted by phone if any changes need to be made immediately.  Otherwise, you will receive a letter about your results with an explanation, but please check with MyChart first.  Please remember to sign up for MyChart if you have not done so, as this will be important to you in the future with finding out test results, communicating by private email, and scheduling acute appointments online when needed.  Please make an Appointment to return in 3 months

## 2020-06-11 NOTE — Assessment & Plan Note (Signed)
Lab Results  Component Value Date   HGBA1C 6.1 (H) 06/02/2020  stable, to continue current diet control

## 2020-06-11 NOTE — Assessment & Plan Note (Signed)
Also for f/u tfts at 4 wks as recommended Lab Results  Component Value Date   TSH 5.609 (H) 06/01/2020

## 2020-06-11 NOTE — Assessment & Plan Note (Signed)
Resolved, to continue as is - no further amoxil, prednisone needed

## 2020-06-11 NOTE — Assessment & Plan Note (Signed)
Stable, cont bb, asa, statin, lasix - for f/u cards mon jan 17 as planned

## 2020-06-14 NOTE — Progress Notes (Addendum)
Virtual Visit via Telephone Note   This visit type was conducted due to national recommendations for restrictions regarding the COVID-19 Pandemic (e.g. social distancing) in an effort to limit this patient's exposure and mitigate transmission in our community.  Due to his co-morbid illnesses, this patient is at least at moderate risk for complications without adequate follow up.  This format is felt to be most appropriate for this patient at this time.  The patient did not have access to video technology/had technical difficulties with video requiring transitioning to audio format only (telephone).  All issues noted in this document were discussed and addressed.  No physical exam could be performed with this format.  Please refer to the patient's chart for his  consent to telehealth for [facility].  Evaluation Performed:  Follow-up visit  This visit type was conducted due to national recommendations for restrictions regarding the COVID-19 Pandemic (e.g. social distancing).  This format is felt to be most appropriate for this patient at this time.  All issues noted in this document were discussed and addressed.  No physical exam was performed (except for noted visual exam findings with Video Visits).  Please refer to the patient's chart (MyChart message for video visits and phone note for telephone visits) for the patient's consent to telehealth for [place]  Date:  06/15/2020   ID:  Elmer Bales, DOB May 16, 1930, MRN [id number]  Patient Location:  [place] [address]   Provider location:     [address] Office [phone] Fax [phone]   PCP:  Biagio Borg, MD  Cardiologist:  Sanda Klein, MD  Electrophysiologist:  Cristopher Peru, MD   Chief Complaint: Follow-up for CHF exacerbation and reduced LVEF  History of Present Illness:    DEQUAN KINDRED is a 87 y.o. male who  presents via audio/video conferencing for a telehealth visit today.  Patient verified DOB and address.  Mr. Doeden has a PMH of hypertension, diabetes mellitus type 2, COPD, hyperlipidemia, CKD stage III, 2-1 AV block status post pacemaker insertion in 2015, carotid artery stenosis status post left CEA 2001, and known chronic left subclavian artery occlusion.   He presented to [facility] emergency department 06/01/2020 with increasing shortness of breath 3-4 days .  He underwent cardiac catheterization which showed nonobstructive CAD.  It was felt that his new reduction in LVEF/cardiomyopathy was related to RV apical pacing- induced dyssynchrony.  He was referred to Dr. Lovena Le for evaluation for CRT-P upgrade.  He was educated about daily weights and sodium restriction.  He was instructed to contact the office with a weight increase of 3 pounds overnight on his home scale.  Is seen virtually today for follow-up evaluation states he feels well.  He is back to his normal daily activities and reports he has been himself daily.  He also states he has monitoring the salt in his diet.  We reviewed his appointment date/time with electrophysiology.  He expresses understanding.  I will give him a salty 6 diet sheet, have him continue to increase his physical activity as tolerated, and have him follow-up with Dr. Sallyanne Kuster in 3 months.  Today he denies chest pain, shortness of breath, lower extremity edema, fatigue, palpitations, melena, hematuria, hemoptysis, diaphoresis, weakness, presyncope, syncope, orthopnea, and PND.    The patient does not symptoms concerning for COVID-19 infection (fever, chills, cough, or new SHORTNESS OF BREATH).    Prior CV  studies:   The following studies were reviewed today:  Echocardiogram 06/02/2020 IMPRESSIONS    1. LV function is moderately reduced ~30-35%. The mid to distal seputm  into apex appears akinetic with global hypokinesis in other segments. The  RWMA may be  pacer related but are a bit more than expected, and cannot  exclude LAD infarction.  2. Left ventricular ejection fraction, by estimation, is 30 to 35%. The  left ventricle has moderately decreased function. The left ventricle  demonstrates regional wall motion abnormalities (see scoring  diagram/findings for description). There is mild  concentric left ventricular hypertrophy. Left ventricular diastolic  function could not be evaluated.  3. Right ventricular systolic function is mildly reduced. The right  ventricular size is moderately enlarged. There is moderately elevated  pulmonary artery systolic pressure. The estimated right ventricular  systolic pressure is 66.2 mmHg.  4. There appears to be mild to moderate calcific mitral stenosis, but  doppler evaluation across the valve was not performed and severity cannot  be adequately assessed. Noted to be at least mild on last study. The  mitral valve is degenerative. Mild to  moderate mitral valve regurgitation. Severe mitral annular calcification.  5. Mild to moderate AS is present. V max 1.8 m/s, MG 8 mmHG, AVA 1.22  cm2, DI 0.39. SV index low due to cardiomyopathy (23 cc/m2). Visually  there is at least moderate AS and AVA/DI support this. The aortic valve is  calcified. Aortic valve regurgitation  is not visualized. Mild to moderate aortic valve stenosis.  6. The inferior vena cava is dilated in size with <50% respiratory  variability, suggesting right atrial pressure of 15 mmHg.   Comparison(s): Changes from prior study are noted. EF now 30-35% with  RWMA, mild to moderate AS present. RVSP now 64 mmHG.  Cardiac catheterization 06/02/2020  Prox Cx lesion is 25% stenosed.  Mid LAD lesion is 50-60% stenosed. Heavily calcified at the origin of the diagonal.  2nd Diag lesion is 50% stenosed.  LV end diastolic pressure is normal.  There is no aortic valve stenosis.   Medical therapy given lack of angina.  Degree of LAD  disease should not cause akinesis of apex. Diagnostic Dominance: Right    Intervention    Past Medical History:  Diagnosis Date  . ALLERGIC RHINITIS 04/09/2007  . Atrial fibrillation ([place])   . BACTEREMIA, 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  . BENIGN PROSTATIC HYPERTROPHY 04/09/2007  . Blood transfusion without reported diagnosis    really not sure thinks 30-40 years ago  . BRADYCARDIA 05/11/2009  . Carotid artery occlusion   . COLONIC POLYPS, HX OF 04/09/2007   ADENOMATOUS POLYPS 9476,5465  . Complete heart block ([place])   . COPD 04/09/2007  . Cough 03/27/2008  . DEPRESSION 04/09/2007  . DIABETES MELLITUS, TYPE II 10/09/2007  . DIVERTICULOSIS, COLON 04/09/2007  . DIZZINESS 10/06/2008  . GLUCOSE INTOLERANCE 04/09/2007  . HYPERLIPIDEMIA 03/27/2007  . HYPERTENSION 03/27/2007  . PERIPHERAL VASCULAR DISEASE 03/27/2007  . PNEUMONIA ORGANISM NOS 11/08/2007  . SHINGLES, HX OF 04/09/2007  . SHOULDER PAIN, BILATERAL 08/01/2007  . Ulcer    40 years ago   Past Surgical History:  Procedure Laterality Date  . APPENDECTOMY     87 years old  . CAROTID ENDARTERECTOMY  5/08   left  . CARPAL TUNNEL RELEASE     bilateral  . COLONOSCOPY    . ESOPHAGOGASTRODUODENOSCOPY  06/07/2004  . HEMORRHOID BANDING  1990s  . LEFT HEART CATH AND CORONARY ANGIOGRAPHY N/A 06/02/2020  Procedure: LEFT HEART CATH AND CORONARY ANGIOGRAPHY;  Surgeon: Jettie Booze, MD;  Location: [place] [facility];  Service: Cardiovascular;  Laterality: N/A;  . PACEMAKER INSERTION  07/11/2013   MDT ADDRL1 pacemaker implanted by Dr Lovena Le for complete heart block  . PERMANENT PACEMAKER INSERTION N/A 07/11/2013   Procedure: PERMANENT PACEMAKER INSERTION;  Surgeon: Evans Lance, MD;  [facility] C[facility];  Service: Cardiovascular;  Laterality: N/A;  . s/p PUD surgury     ? partial gastrectomy     Current Meds  Medication Sig  . albuterol (VENTOLIN HFA) 108 (90 Base) MCG/ACT inhaler Inhale 2 puffs into the lungs  every 6 (six) hours as needed for wheezing or shortness of breath.  Marland Kitchen aspirin 325 MG tablet Take 1 tablet (325 mg total) by mouth daily.  . Coenzyme Q10 (COQ-10) 200 MG CAPS Take 200 mg by mouth daily after lunch.  . furosemide (LASIX) 40 MG tablet Take 1 tablet (40 mg total) by mouth daily.  . metoprolol succinate (TOPROL-XL) 25 MG 24 hr tablet Take 1/2 (one-half) tablet by mouth once daily (Patient taking differently: Take 12.5 mg by mouth daily.)  . omeprazole (PRILOSEC) 20 MG capsule TAKE 1 CAPSULE BY MOUTH ONCE DAILY (Patient taking differently: Take 20 mg by mouth daily.)  . potassium chloride SA (KLOR-CON) 20 MEQ tablet Take 1 tablet (20 mEq total) by mouth daily.  . Psyllium (METAMUCIL PO) Take 2 capsules by mouth daily.  . rosuvastatin (CRESTOR) 20 MG tablet Take 2 tablets (40 mg total) by mouth every other day. (Patient taking differently: Take 20 mg by mouth See admin instructions. Take one tablet (20 mg) by mouth daily at bedtime for 2 nights, skip 3rd night, then repeat.)  . VITAMIN D PO Take 1 tablet by mouth daily.     Allergies:   Ace inhibitors   Social History   Tobacco Use  . Smoking status: Former Smoker    Packs/day: 1.00    Years: 50.00    Pack years: 50.00    Types: Cigarettes    Quit date: 08/20/1999    Years since quitting: 20.8  . Smokeless tobacco: Never Used  Vaping Use  . Vaping Use: Never used  Substance Use Topics  . Alcohol use: No    Alcohol/week: 0.0 standard drinks  . Drug use: No     Family Hx: The patient's family history includes Heart disease in his sister and another family member; Hypertension in his father, mother, sister, sister, sister, and sister; Lung cancer in his sister; Peripheral vascular disease in his brother; Stroke in an other family member; Ulcers in his father and sister. There is no history of Colon cancer.  ROS:   Please see the history of present illness.     All other systems reviewed and are negative.   Labs/Other  Tests and Data Reviewed:    Recent Labs: 11/20/2019: ALT 13 06/01/2020: B Natriuretic Peptide 971.2; TSH 5.609 06/02/2020: Hemoglobin 14.0; Magnesium 2.1; Platelets 167 06/03/2020: BUN 35; Creatinine, Ser 1.73; Potassium 4.5; Sodium 138   Recent Lipid Panel Lab Results  Component Value Date/Time   CHOL 180 11/20/2019 11:07 AM   TRIG 179.0 (H) 11/20/2019 11:07 AM   HDL 35.80 (L) 11/20/2019 11:07 AM   CHOLHDL 5 11/20/2019 11:07 AM   LDLCALC 108 (H) 11/20/2019 11:07 AM   LDLDIRECT 189.9 10/02/2007 11:07 AM    Wt Readings from Last 3 Encounters:  06/15/20 168 lb (76.2 kg)  06/11/20 173 lb 3.2 oz (  78.6 kg)  06/03/20 184 lb 8.4 oz (83.7 kg)     Exam:    Vital Signs:  BP 95/76   Pulse 81   Ht _0  (1.854 m)   Wt 168 lb (76.2 kg)   BMI 22.16 kg/m    Well nourished, well developed male in no  acute distress.   ASSESSMENT & PLAN:    1.  New systolic heart failure- no increased DOE or activity intolerance.  Echocardiogram showed newly reduced EF of 30-35%.  Evidence of dyssynchrony noted.  Referred to EP for CRT upgrade.  Cardiac catheterization showed nonobstructive CAD. Continue furosemide, metoprolol, potassium, Heart healthy low-sodium diet-salty 6 given Increase physical activity as tolerated Contact office with a weight gram of 3 pounds overnight or 5 pounds in 1 week Follow-up with EP 06/18/20  Essential hypertension-BP today 95/76.  Not a candidate for ARB/ACE/Arni due to renal function. Continue metoprolol Heart healthy low-sodium diet-salty 6 given Increase physical activity as tolerated  Second-degree heart block status post PPM- interrogation showed good function, no impedance, a sensed, V paced, Follows with EP  Carotid artery stenosis/chronic left subclavian occlusion-status post left CEA. Continue aspirin, co-Q10 Follows with AVS  Disposition: Follow-up with Dr. Sallyanne Kuster or me in 3 months.  COVID-19 Education: The signs and symptoms of COVID-19 were discussed  with the patient and how to seek care for testing (follow up with PCP or arrange E-visit).  The importance of social distancing was discussed today.  Patient Risk:   After full review of this patients clinical status, I feel that they are at least moderate risk at this time.  Time:   Today, I have spent 5 minutes with the patient with telehealth technology discussing diet, exercise, pacemaker, upcoming electrophysiology appointments.  I spent greater than 20 minutes reviewing the patient's past medical history, previous cardiac test, and medications.   Medication Adjustments/Labs and Tests Ordered: Current medicines are reviewed at length with the patient today.  Concerns regarding medicines are outlined above.   Tests Ordered: No orders of the defined types were placed in this encounter.  Medication Changes: No orders of the defined types were placed in this encounter.   Disposition:  in 3 month(s)  Signed, Jossie Ng. Kamarii Carton NP-C    01/01/2019 11:58 AM    [address] Office [phone] Fax [phone]

## 2020-06-15 ENCOUNTER — Telehealth (INDEPENDENT_AMBULATORY_CARE_PROVIDER_SITE_OTHER): Payer: Medicare Other | Admitting: General Practice

## 2020-06-15 ENCOUNTER — Encounter: Payer: Self-pay | Admitting: General Practice

## 2020-06-15 VITALS — BP 95/76 | HR 81 | Ht 73.0 in | Wt 168.0 lb

## 2020-06-15 DIAGNOSIS — Z950 Presence of cardiac pacemaker: Secondary | ICD-10-CM

## 2020-06-15 DIAGNOSIS — I441 Atrioventricular block, second degree: Secondary | ICD-10-CM

## 2020-06-15 DIAGNOSIS — I6529 Occlusion and stenosis of unspecified carotid artery: Secondary | ICD-10-CM

## 2020-06-15 DIAGNOSIS — I10 Essential (primary) hypertension: Secondary | ICD-10-CM | POA: Diagnosis not present

## 2020-06-15 DIAGNOSIS — I5021 Acute systolic (congestive) heart failure: Secondary | ICD-10-CM

## 2020-06-15 NOTE — Patient Instructions (Signed)
needs salty 6, increase physical activity as tolerated, f/u with EP as scheduled and Dr. Loletha Grayer in 3 months. No labs or meds. Contact office with a weight increase of 3 pounds overnight or 5 pounds in 1 week  Medication Instructions:  The current medical regimen is effective;  continue present plan and medications as directed. Please refer to the Current Medication list given to you today.  *If you need a refill on your cardiac medications before your next appointment, please call your pharmacy*  Lab Work:   Testing/Procedures:  NONE     NONE  Special Instructions PLEASE KEEP SCHEDULED FOLLOW UP  PLEASE CONTINUE TO [place]. CALL OUR OFFICE FOR WEIGHT GAIN >3 lbs IN A DAY OR 5 lbs IN A WEEK  PLEASE READ AND FOLLOW SALTY 6-ATTACHED-1,800mg  daily  PLEASE INCREASE PHYSICAL ACTIVITY AS TOLERATED  Follow-Up: Your next appointment:  3 month(s) In Person with Sanda Klein, MD OR IF UNAVAILABLE JESSE CLEAVER, FNP-C  At [facility], you and your health needs are our priority.  As part of our continuing mission to provide you with exceptional heart care, we have created designated Provider Care Teams.  These Care Teams include your primary Cardiologist (physician) and Advanced Practice Providers (APPs -  Physician Assistants and Nurse Practitioners) who all work together to provide you with the care you need, when you need it.            6 SALTY THINGS TO AVOID     1,800MG  DAILY

## 2020-06-18 ENCOUNTER — Ambulatory Visit: Payer: Medicare Other | Admitting: Internal Medicine

## 2020-06-18 ENCOUNTER — Other Ambulatory Visit: Payer: Self-pay

## 2020-06-18 ENCOUNTER — Encounter: Payer: Self-pay | Admitting: Internal Medicine

## 2020-06-18 VITALS — BP 100/70 | HR 77 | Ht 73.0 in | Wt 170.6 lb

## 2020-06-18 DIAGNOSIS — I442 Atrioventricular block, complete: Secondary | ICD-10-CM | POA: Insufficient documentation

## 2020-06-18 DIAGNOSIS — I5042 Chronic combined systolic (congestive) and diastolic (congestive) heart failure: Secondary | ICD-10-CM | POA: Diagnosis not present

## 2020-06-18 DIAGNOSIS — I10 Essential (primary) hypertension: Secondary | ICD-10-CM

## 2020-06-18 DIAGNOSIS — Z950 Presence of cardiac pacemaker: Secondary | ICD-10-CM

## 2020-06-18 NOTE — Patient Instructions (Addendum)
Medication Instructions:  Your physician recommends that you continue on your current medications as directed. Please refer to the Current Medication list given to you today.  Labwork: None ordered.  Testing/Procedures:  The following dates are available for an upgrade to a biventricular pacemaker: February 4, 8, 14, 16, 21 March 2, 4, 16, 18, 21  Follow-Up: Your physician wants you to follow-up in: one year with Cristopher Peru, MD or one of the following Advanced Practice Providers on your designated Care Team:    Chanetta Marshall, NP  Tommye Standard, PA-C  Legrand Como "Jonni Sanger" [place], [place]  Remote monitoring is used to monitor your Pacemaker from home. This monitoring reduces the number of office visits required to check your device to one time per year. It allows [place] to keep an eye on the functioning of your device to ensure it is working properly. You are scheduled for a device check from home on 07/09/2020. You may send your transmission at any time that day. If you have a wireless device, the transmission will be sent automatically. After your physician reviews your transmission, you will receive a postcard with your next transmission date.  Any Other Special Instructions Will Be Listed Below (If Applicable).  If you need a refill on your cardiac medications before your next appointment, please call your pharmacy.

## 2020-06-18 NOTE — Progress Notes (Signed)
HPI Daniel Woodard returns today for followup. He is a pleasant 87 yo man with chb, s/p PPM insertion, HTN, carotid vascular disease, who has done well but notes occaisional episodes of dyspnea. The patient tries to avoid sodium excess. He denies syncope or peripheral edema or chest pain. He was in the hospital a few weeks ago with acute CHF and his echo demonstrated an EF of 30%. He has been treated with diuretic therapy. He feels better. He is fairly sedentary. Allergies  Allergen Reactions  . Ace Inhibitors Cough          Current Outpatient Medications  Medication Sig Dispense Refill  . albuterol (VENTOLIN HFA) 108 (90 Base) MCG/ACT inhaler Inhale 2 puffs into the lungs every 6 (six) hours as needed for wheezing or shortness of breath. 8 g 6  . aspirin 325 MG tablet Take 1 tablet (325 mg total) by mouth daily.    . Coenzyme Q10 (COQ-10) 200 MG CAPS Take 200 mg by mouth daily after lunch.    . furosemide (LASIX) 40 MG tablet Take 1 tablet (40 mg total) by mouth daily. 30 tablet 1  . metoprolol succinate (TOPROL-XL) 25 MG 24 hr tablet Take 1/2 (one-half) tablet by mouth once daily 45 tablet 0  . omeprazole (PRILOSEC) 20 MG capsule TAKE 1 CAPSULE BY MOUTH ONCE DAILY 90 capsule 3  . potassium chloride SA (KLOR-CON) 20 MEQ tablet Take 1 tablet (20 mEq total) by mouth daily. 30 tablet 1  . Psyllium (METAMUCIL PO) Take 2 capsules by mouth daily.    . rosuvastatin (CRESTOR) 20 MG tablet Take 2 tablets (40 mg total) by mouth every other day. 90 tablet 3  . VITAMIN D PO Take 1 tablet by mouth daily.     No current facility-administered medications for this visit.     Past Medical History:  Diagnosis Date  . ALLERGIC RHINITIS 04/09/2007  . Atrial fibrillation ([place])   . BACTEREMIA, 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  . BENIGN PROSTATIC HYPERTROPHY 04/09/2007  . Blood transfusion without reported diagnosis    really not sure thinks 30-40 years ago  . BRADYCARDIA 05/11/2009  .  Carotid artery occlusion   . COLONIC POLYPS, HX OF 04/09/2007   ADENOMATOUS POLYPS 7416,3845  . Complete heart block ([place])   . COPD 04/09/2007  . Cough 03/27/2008  . DEPRESSION 04/09/2007  . DIABETES MELLITUS, TYPE II 10/09/2007  . DIVERTICULOSIS, COLON 04/09/2007  . DIZZINESS 10/06/2008  . GLUCOSE INTOLERANCE 04/09/2007  . HYPERLIPIDEMIA 03/27/2007  . HYPERTENSION 03/27/2007  . PERIPHERAL VASCULAR DISEASE 03/27/2007  . PNEUMONIA ORGANISM NOS 11/08/2007  . SHINGLES, HX OF 04/09/2007  . SHOULDER PAIN, BILATERAL 08/01/2007  . Ulcer    40 years ago    ROS:   All systems reviewed and negative except as noted in the HPI.   Past Surgical History:  Procedure Laterality Date  . APPENDECTOMY     87 years old  . CAROTID ENDARTERECTOMY  5/08   left  . CARPAL TUNNEL RELEASE     bilateral  . COLONOSCOPY    . ESOPHAGOGASTRODUODENOSCOPY  06/07/2004  . HEMORRHOID BANDING  1990s  . LEFT HEART CATH AND CORONARY ANGIOGRAPHY N/A 06/02/2020   Procedure: LEFT HEART CATH AND CORONARY ANGIOGRAPHY;  Surgeon: Jettie Booze, MD;  Location: [facility];  Service: Cardiovascular;  Laterality: N/A;  . PACEMAKER INSERTION  07/11/2013   MDT ADDRL1 pacemaker implanted by Dr Lovena Le for complete heart block  . PERMANENT PACEMAKER INSERTION  N/A 07/11/2013   Procedure: PERMANENT PACEMAKER INSERTION;  Surgeon: Evans Lance, MD;  Location: [facility] C[facility] LAB;  Service: Cardiovascular;  Laterality: N/A;  . s/p PUD surgury     ? partial gastrectomy     Family History  Problem Relation Age of Onset  . Hypertension Father   . Ulcers Father   . Hypertension Mother   . Hypertension Sister   . Peripheral vascular disease Brother   . Hypertension Sister   . Ulcers Sister        Ulcers  . Hypertension Sister   . Lung cancer Sister        smoked  . Heart disease Sister        Carotid   . Hypertension Sister   . Heart disease Other        Cardiovascular disorder, CHF  . Stroke Other        1st degree  relative male and male  . Colon cancer Neg Hx      Social History   Socioeconomic History  . Marital status: Widowed    Spouse name: Not on file  . Number of children: 2  . Years of education: Not on file  . Highest education level: Not on file  Occupational History  . 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 Smoking status: Former Smoker    Packs/day: 1.00    Years: 50.00    Pack years: 50.00    Types: Cigarettes    Quit date: 08/20/1999    Years since quitting: 20.8  . Smokeless tobacco: Never Used  Vaping Use  . Vaping Use: Never used  Substance and Sexual Activity  . Alcohol use: No    Alcohol/week: 0.0 standard drinks  . Drug use: No  . Sexual activity: Not on file  Other Topics Concern  . Not on file  Social History Narrative   Widowed; wife dies September 07, 2018   Daily caffeine    Social Determinants of Health   Financial Resource Strain: Not on file  Food Insecurity: Not on file  Transportation Needs: Not on file  Physical Activity: Not on file  Stress: Not on file  Social Connections: Not on file  Intimate Partner Violence: Not on file     BP 100/70   Pulse 77   Ht _0  (1.854 m)   Wt 170 lb 9.6 oz (77.4 kg)   SpO2 93%   BMI 22.51 kg/m   Physical Exam:  Well appearing NAD HEENT: Unremarkable Neck:  No JVD, no thyromegally Lymphatics:  No adenopathy Back:  No CVA tenderness Lungs:  Clear with no wheezes HEART:  Regular rate rhythm, no murmurs, no rubs, no clicks Abd:  soft, positive bowel sounds, no organomegally, no rebound, no guarding Ext:  2 plus pulses, no edema, no cyanosis, no clubbing Skin:  No rashes no nodules Neuro:  CN II through XII intact, motor grossly intact    DEVICE  Normal device function.  See PaceArt for details.   Assess/Plan: 1. CHB - he is asymptomatic, s/p PPM insertion.  2. Acute systolic heart failure  - he is better with lasix. He has an EF of 30%. I discussed  cardiac resynchronization.  He is encouraged to maintain a low sodium diet. 3. PPM - his medtronic DDD PM is working normally. He will consider upgrade to a biv PPM and call us if he would like to schedule. 4. HTN - his bp is well  controlled. He will continue his current meds.  Carleene Overlie Milah Recht,MD

## 2020-06-22 ENCOUNTER — Telehealth: Payer: Self-pay | Admitting: Internal Medicine

## 2020-06-22 DIAGNOSIS — I442 Atrioventricular block, complete: Secondary | ICD-10-CM

## 2020-06-22 DIAGNOSIS — I5042 Chronic combined systolic (congestive) and diastolic (congestive) heart failure: Secondary | ICD-10-CM

## 2020-06-22 NOTE — Telephone Encounter (Signed)
Patient is requesting to schedule her PPM gen change. Please call.

## 2020-06-23 NOTE — Telephone Encounter (Signed)
Call placed to Pt.  Pt scheduled for BIV PPM upgrade on July 31, 2020 at 9:30 am  Labs/covid test March 1  Work up complete

## 2020-07-06 ENCOUNTER — Ambulatory Visit: Payer: Medicare Other | Admitting: Internal Medicine

## 2020-07-09 ENCOUNTER — Other Ambulatory Visit (INDEPENDENT_AMBULATORY_CARE_PROVIDER_SITE_OTHER): Payer: Medicare Other

## 2020-07-09 DIAGNOSIS — I442 Atrioventricular block, complete: Secondary | ICD-10-CM

## 2020-07-09 DIAGNOSIS — I5042 Chronic combined systolic (congestive) and diastolic (congestive) heart failure: Secondary | ICD-10-CM

## 2020-07-09 DIAGNOSIS — R7989 Other specified abnormal findings of blood chemistry: Secondary | ICD-10-CM | POA: Diagnosis not present

## 2020-07-09 LAB — TSH: TSH: 4.35 u[IU]/mL (ref 0.35–4.50)

## 2020-07-09 LAB — CBC WITH DIFFERENTIAL/PLATELET
Basophils Absolute: 0.1 10*3/uL (ref 0.0–0.1)
Basophils Relative: 0.9 % (ref 0.0–3.0)
Eosinophils Absolute: 0.1 10*3/uL (ref 0.0–0.7)
Eosinophils Relative: 2 % (ref 0.0–5.0)
HCT: 50.9 % (ref 39.0–52.0)
Hemoglobin: 16.9 g/dL (ref 13.0–17.0)
Lymphocytes Relative: 23.4 % (ref 12.0–46.0)
Lymphs Abs: 1.5 10*3/uL (ref 0.7–4.0)
MCHC: 33.3 g/dL (ref 30.0–36.0)
MCV: 85.9 fl (ref 78.0–100.0)
Monocytes Absolute: 1.1 10*3/uL — ABNORMAL HIGH (ref 0.1–1.0)
Monocytes Relative: 17.8 % — ABNORMAL HIGH (ref 3.0–12.0)
Neutro Abs: 3.5 10*3/uL (ref 1.4–7.7)
Neutrophils Relative %: 55.9 % (ref 43.0–77.0)
Platelets: 175 10*3/uL (ref 150.0–400.0)
RBC: 5.93 Mil/uL — ABNORMAL HIGH (ref 4.22–5.81)
RDW: 16.8 % — ABNORMAL HIGH (ref 11.5–15.5)
WBC: 6.3 10*3/uL (ref 4.0–10.5)

## 2020-07-09 LAB — BASIC METABOLIC PANEL
BUN: 42 mg/dL — ABNORMAL HIGH (ref 6–23)
CO2: 31 mEq/L (ref 19–32)
Calcium: 9.7 mg/dL (ref 8.4–10.5)
Chloride: 98 mEq/L (ref 96–112)
Creatinine, Ser: 2.07 mg/dL — ABNORMAL HIGH (ref 0.40–1.50)
GFR: 28.09 mL/min — ABNORMAL LOW (ref >60.00)
Glucose, Bld: 118 mg/dL — ABNORMAL HIGH (ref 70–99)
Potassium: 5.1 mEq/L (ref 3.5–5.1)
Sodium: 135 mEq/L (ref 135–145)

## 2020-07-09 LAB — T4, FREE: Free T4: 0.84 ng/dL (ref 0.60–1.60)

## 2020-07-11 ENCOUNTER — Encounter: Payer: Self-pay | Admitting: Internal Medicine

## 2020-07-13 LAB — CUP PACEART REMOTE DEVICE CHECK
Battery Impedance: 929 Ohm
Battery Remaining Longevity: 61 mo
Battery Voltage: 2.75 V
Brady Statistic AP VP Percent: 19 %
Brady Statistic AP VS Percent: 0 %
Brady Statistic AS VP Percent: 81 %
Brady Statistic AS VS Percent: 0 %
Date Time Interrogation Session: 20220214140443
Implantable Lead Implant Date: 20150212
Implantable Lead Implant Date: 20150212
Implantable Lead Location: 753859
Implantable Lead Location: 753860
Implantable Lead Model: 5076
Implantable Lead Model: 5076
Implantable Pulse Generator Implant Date: 20150212
Lead Channel Impedance Value: 458 Ohm
Lead Channel Impedance Value: 525 Ohm
Lead Channel Pacing Threshold Amplitude: 0.75 V
Lead Channel Pacing Threshold Amplitude: 0.875 V
Lead Channel Pacing Threshold Pulse Width: 0.4 ms
Lead Channel Pacing Threshold Pulse Width: 0.4 ms
Lead Channel Setting Pacing Amplitude: 2 V
Lead Channel Setting Pacing Amplitude: 2.5 V
Lead Channel Setting Pacing Pulse Width: 0.4 ms
Lead Channel Setting Sensing Sensitivity: 4 mV

## 2020-07-16 ENCOUNTER — Ambulatory Visit: Payer: Medicare Other | Admitting: Internal Medicine

## 2020-07-16 ENCOUNTER — Other Ambulatory Visit: Payer: Self-pay

## 2020-07-16 ENCOUNTER — Encounter: Payer: Self-pay | Admitting: Internal Medicine

## 2020-07-16 DIAGNOSIS — J449 Chronic obstructive pulmonary disease, unspecified: Secondary | ICD-10-CM | POA: Diagnosis not present

## 2020-07-16 DIAGNOSIS — J84112 Idiopathic pulmonary fibrosis: Secondary | ICD-10-CM | POA: Diagnosis not present

## 2020-07-16 NOTE — Assessment & Plan Note (Signed)
Quit smoking 2001 - PFTs 11/28/14  FEV 2.00 (66%) ratio 63 and no change p saba, dlco 47 corrects to 69 for alv vol - 07/05/2019  After extensive coaching inhaler device,  effectiveness =    75% > try Breztri 2bid > no change so did not fill rx  - 07/06/19  Alpha one phenotype MM  Level 132  - PFT's  01/02/2020  FEV1 1.65 (58 % ) ratio 0.55  p 24 % improvement from saba p 0 prior to study with DLCO  12.51 (50%) corrects to 2.46 (65%)  for alv volume and FV curve concave  - 01/02/2020  After extensive coaching inhaler device,  effectiveness =    75% > try dulera 100 2bid > no better so just rx prn saba   As I explained to this patient in detail:  although there is significant copd present, it may not be clinically relevant:   it does not appear to be limiting desired activity tolerance  so I don't recommend aggressive pulmonary rx at this point unless limiting symptoms arise or acute exacerbations become as issue, neither of which is the case now.  I asked the patient to contact this office at any time in the future should either of these problems arise.     Re saba: I spent extra time with pt today reviewing appropriate use of albuterol for prn use on exertion with the following points: 1) saba is for relief of sob that does not improve by walking a slower pace or resting but rather if the pt does not improve after trying this first. 2) If the pt is convinced, as many are, that saba helps recover from activity faster then it's easy to tell if this is the case by re-challenging : ie stop, take the inhaler, then p 5 minutes try the exact same activity (intensity of workload) that just caused the symptoms and see if they are substantially diminished or not after saba 3) if there is an activity that reproducibly causes the symptoms, try the saba 15 min before the activity on alternate days   If in fact the saba really does help, then fine to continue to use it prn but advised may need to look closer at the  maintenance regimen being used to achieve better control of airways disease with exertion.

## 2020-07-16 NOTE — Assessment & Plan Note (Signed)
Noted on cxr 07/05/2019 s acute changes clinically  - rec HRCT p levaquin 500 mg daily x 10 days  - HRCT chest 07/17/19 Spectrum of findings compatible with basilar predominant fibroticinterstitial lung disease with mild traction bronchiectasis and no frank honeycombing. Findings are categorized as probable UIP - 08/16/2019   Walked RA x 3 laps =  approx 764ft @ nl pace - stopped due to end of study  with sats of 93 % at the end of the study s sob  - 01/02/2020 PFTs  Vs 5 y prior:   FVC down from 3.16 to 2.72 and DLC0 down from 16.48 to 12.51 neither value corrected for hgb   Discussed in detail all the  indications, usual  risks and alternatives  relative to the benefits with patient who agrees to proceed with conservative f/u, not interested in antifibrotics/ PF clinic at this point and hopes to see improvement in doe with upcoming pacemaker placement but in an case rec:  Make sure you check your oxygen saturation at your highest level of activity to be sure it stays over 90% and keep track of it at least once a week, more often if breathing getting worse, and let me know if losing ground.          Each maintenance medication was reviewed in detail including emphasizing most importantly the difference between maintenance and prns and under what circumstances the prns are to be triggered using an action plan format where appropriate.  Total time for H and P, chart review, counseling, reviewing hfa device(s) and generating customized AVS unique to this office visit / same day charting = 24 min

## 2020-07-16 NOTE — Patient Instructions (Addendum)
Only use your albuterol as a rescue medication to be used if you can't catch your breath by resting or doing a relaxed purse lip breathing pattern.  - The less you use it, the better it will work when you need it. - Ok to use up to 2 puffs  every 4 hours if you must but call for immediate appointment if use goes up over your usual need - Don't leave home without it !!  (think of it like the spare tire for your car)    Also ok to Try albuterol 15 min before an activity that you know would make you short of breath and see if it makes any difference and if makes none then don't take it after activity unless you can't catch your breath.      Make sure you check your oxygen saturation at your highest level of activity to be sure it stays over 90% and keep track of it at least once a week, more often if breathing getting worse, and let me know if losing ground.    Please schedule a follow up visit in 6 months but call sooner if needed  with all medications /inhalers/ solutions in hand so we can verify exactly what you are taking. This includes all medications from all doctors and over the counters

## 2020-07-16 NOTE — Progress Notes (Signed)
Daniel Woodard, male    DOB: May 24, 1930    MRN: [id number]   Brief patient profile:  89   yowm MM/quit smoking 2001 self referred 08/11/14 to pulmonary clinic with GOLD II/ copd documented 11/28/2014 @ MMRC=1 and lost to f/u on no  maint resp rx  Then early fall 2020 gradually worse sob        History of Present Illness  07/05/2019  Pulmonary/ 1st office eval/Kennice Finnie  Chief Complaint  Patient presents with  . Consult    Patient is having shortness of breath with exertion and daily activites that started 6 months ago. Patient has a productive cough with grey sputum.   Dyspnea:  500 ft  To mailbox and has to sit on patio to get his breath = MMRC2 = can't walk a nl pace on a flat grade s sob but does fine slow and flat  Cough: varies mostly daytime / rattle min mucoid to brown occ in am  Sleep:  30-40 degrees on pillows   SABA use: none  rec Breztri Take 2 puffs first thing in am and then another 2 puffs about 12 hours later.  Work on inhaler technique:    Please remember to go to the lab and x-ray department   for your tests - we will call you with the results when they are available.   08/16/2019  f/u ov/Hriday Stai re: GOLD II/ doe no change on breztri but never rechallenged  Chief Complaint  Patient presents with  . Follow-up    Breathing is doing well and no new co's today.   Dyspnea:  Breathing well but more  inactive  Cough: rattles a bit but no mucus Sleeping: able to lie flat now  SABA use: none 02: none  rec No changes in medications Make sure you check your oxygen saturations at highest level of activity to be sure it stays over 90% and call if trending dow    09/27/2019  f/u ov/Aedyn Kempfer re:   GOLD II  / pf with bronchiectasis / s/p both vaccinations pfizer  Chief Complaint  Patient presents with  . Follow-up  Dyspnea:  Does sams, sats usually > 90% RA  Cough: rattle some in am but no mucus  Sleeping: x one year poor wakes up p only a few hours s resp symptoms, no caffeine or  napping  SABA use: none  02: no Rec No change   01/02/2020  f/u ov/Sharifah Champine re: GOLD II/ AB component  Chief Complaint  Patient presents with  . Follow-up    PFT done today.  Breathing is overall doing well. He c/o rhinitis.   Dyspnea:  Walking at [place]/ walks driveway  Cough: rattles worse in am > white  Sleeping: ok resp wise  SABA use: none  02: none  rec Start  dulera 100 (or Breztri) take 2 puffs first thing in am and then another 2 puffs about 12 hours later.  Work on inhaler technique:  Please schedule a follow up visit in 3 months but call sooner if needed  - bring inhaler with you and your drug formulary (your list of cheaper alternative)    07/16/2020  f/u ov/Jonica Bickhart re:  GOLD II/ AB component no better on dulera or breztri  No chief complaint on file.  Dyspnea:  Still walking sams  Cough: none  Sleeping: ok almost flat  SABA use: avg once daily  02: none  Covid status:  vax x 3    No obvious day  to day or daytime variability or assoc excess/ purulent sputum or mucus plugs or hemoptysis or cp or chest tightness, subjective wheeze or overt sinus or hb symptoms.   Sleeping  without nocturnal  or early am exacerbation  of respiratory  c/o's or need for noct saba. Also denies any obvious fluctuation of symptoms with weather or environmental changes or other aggravating or alleviating factors except as outlined above   No unusual exposure hx or h/o childhood pna/ asthma or knowledge of premature birth.  Current Allergies, Complete Past Medical History, Past Surgical History, Family History, and Social History were reviewed in [employer] record.  ROS  The following are not active complaints unless bolded Hoarseness, sore throat, dysphagia, dental problems, itching, sneezing,  nasal congestion or discharge of excess mucus or purulent secretions, ear ache,   fever, chills, sweats, unintended wt loss or wt gain, classically pleuritic or exertional cp,   orthopnea pnd or arm/hand swelling  or leg swelling, presyncope, palpitations, abdominal pain, anorexia, nausea, vomiting, diarrhea  or change in bowel habits or change in bladder habits, change in stools or change in urine, dysuria, hematuria,  rash, arthralgias, visual complaints, headache, numbness, weakness or ataxia or problems with walking or coordination,  change in mood or  memory.        Current Meds  Medication Sig  . albuterol (VENTOLIN HFA) 108 (90 Base) MCG/ACT inhaler Inhale 2 puffs into the lungs every 6 (six) hours as needed for wheezing or shortness of breath.  Marland Kitchen aspirin 325 MG tablet Take 1 tablet (325 mg total) by mouth daily.  . Coenzyme Q10 (COQ-10) 200 MG CAPS Take 200 mg by mouth daily after lunch.  . furosemide (LASIX) 40 MG tablet Take 1 tablet (40 mg total) by mouth daily.  . metoprolol succinate (TOPROL-XL) 25 MG 24 hr tablet Take 1/2 (one-half) tablet by mouth once daily  . omeprazole (PRILOSEC) 20 MG capsule TAKE 1 CAPSULE BY MOUTH ONCE DAILY  . potassium chloride SA (KLOR-CON) 20 MEQ tablet Take 1 tablet (20 mEq total) by mouth daily.  . Psyllium (METAMUCIL PO) Take 2 capsules by mouth daily.  . rosuvastatin (CRESTOR) 20 MG tablet Take 2 tablets (40 mg total) by mouth every other day.  Marland Kitchen VITAMIN D PO Take 1 tablet by mouth daily.            Past Medical History:  Diagnosis Date  . ALLERGIC RHINITIS 04/09/2007  . Atrial fibrillation ([place])   . BACTEREMIA, 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  . BENIGN PROSTATIC HYPERTROPHY 04/09/2007  . Blood transfusion without reported diagnosis    really not sure thinks 30-40 years ago  . BRADYCARDIA 05/11/2009  . Carotid artery occlusion   . COLONIC POLYPS, HX OF 04/09/2007   ADENOMATOUS POLYPS 5597,4163  . Complete heart block ([place])   . COPD 04/09/2007  . Cough 03/27/2008  . DEPRESSION 04/09/2007  . DIABETES MELLITUS, TYPE II 10/09/2007  . DIVERTICULOSIS, COLON 04/09/2007  . DIZZINESS 10/06/2008  . GLUCOSE  INTOLERANCE 04/09/2007  . HYPERLIPIDEMIA 03/27/2007  . HYPERTENSION 03/27/2007  . PERIPHERAL VASCULAR DISEASE 03/27/2007  . PNEUMONIA ORGANISM NOS 11/08/2007  . SHINGLES, HX OF 04/09/2007  . SHOULDER PAIN, BILATERAL 08/01/2007  . Ulcer    40 years ago       Objective:     07/16/2020       172 01/02/2020         193   09/27/2019      197   08/16/19  195 lb (88.5 kg)  07/23/19 194 lb (88 kg)  07/05/19 194 lb (88 kg)     Vital signs reviewed  07/16/2020  - Note at rest 02 sats  94% on RA   General appearance:    Pleasant wm wm nad       Full dentures    HEENT : pt wearing mask not removed for exam due to covid - 19 concerns.    NECK :  without JVD/Nodes/TM/ nl carotid upstrokes bilaterally   LUNGS: no acc muscle use,  Mild barrel  contour chest wall with bilateral  Distant bs s audible wheeze and  without cough on insp or exp maneuvers  and mild  Hyperresonant  to  percussion bilaterally     CV:  RRR  no s3 or murmur or increase in P2, and no edema   ABD:  soft and nontender with pos end  insp Hoover's  in the supine position. No bruits or organomegaly appreciated, bowel sounds nl  MS:   Nl gait/  ext warm without deformities, calf tenderness, cyanosis or clubbing No obvious joint restrictions   SKIN: warm and dry without lesions    NEURO:  alert, approp, nl sensorium with  no motor or cerebellar deficits apparent.         I personally reviewed images and agree with radiology impression as follows:  CXR:   Portable 06/01/20 Cardiomegaly with increased basilar interstitial opacities suggesting component of basilar edema superimposed on chronic pulmonary fibrosis. Suspect small pleural effusions. Findings suggest early CHF.       Assessment

## 2020-07-28 ENCOUNTER — Telehealth: Payer: Self-pay

## 2020-07-28 ENCOUNTER — Other Ambulatory Visit: Payer: Medicare Other

## 2020-07-28 ENCOUNTER — Other Ambulatory Visit (HOSPITAL_COMMUNITY): Payer: Medicare Other

## 2020-07-28 NOTE — Telephone Encounter (Signed)
Call placed to Pt.  Advised needed to reschedule his procedure.  Procedure rescheduled for August 14, 2020 at 9:30 am  Labs/covid test rescheduled.  Message sent to Pt's daughter via mychart.

## 2020-07-30 ENCOUNTER — Telehealth: Payer: Self-pay | Admitting: Internal Medicine

## 2020-07-30 MED ORDER — ROSUVASTATIN CALCIUM 20 MG PO TABS: 20.0000 mg | ORAL_TABLET | ORAL | 3 refills | Status: DC

## 2020-07-30 NOTE — Telephone Encounter (Signed)
Dr Jenny Reichmann is monitoring his labs so should be the one refilling meds like this which  need  Monitoring with labs

## 2020-07-30 NOTE — Telephone Encounter (Signed)
Ok this is corrected

## 2020-07-30 NOTE — Telephone Encounter (Signed)
I called and spoke with pt and he is aware of MW recs and will call the pharmacy to have them send this over to Dr. Jenny Reichmann.

## 2020-07-30 NOTE — Telephone Encounter (Signed)
MW are you ok with filling the lasix and potassium for this pt?  He stated that he is not sure who the doctor is that filled it last, but I am thinking that it was a hospitalist.  Please advise. Thanks   Pt was last seen by you on 07/16/20

## 2020-07-30 NOTE — Telephone Encounter (Signed)
Vicente Males from [facility] calling, states she spoke with the patient about his rosuvastatin (CRESTOR) 20 MG tablet and the patient states he only takes one pill every two days because he spoke to Dr. Jenny Reichmann about it, but because the prescription is written to take two every other day it is causing the patient to have a non-adherence. If he is only supposed to be taking one every other day she needs a new prescription sent in so he will be in adherence.  Vicente [phone]

## 2020-07-31 ENCOUNTER — Telehealth: Payer: Self-pay | Admitting: Internal Medicine

## 2020-07-31 MED ORDER — POTASSIUM CHLORIDE CRYS ER 20 MEQ PO TBCR: 20.0000 meq | EXTENDED_RELEASE_TABLET | Freq: Every day | ORAL | 0 refills | Status: DC

## 2020-07-31 MED ORDER — FUROSEMIDE 40 MG PO TABS: 40.0000 mg | ORAL_TABLET | Freq: Every day | ORAL | 0 refills | Status: DC

## 2020-07-31 NOTE — Telephone Encounter (Signed)
These medications were done 1 mo courtesy only  Please see cardiology for further refills as I think this was intended   I see pt is for cardiology related procedure mar 18 but meds will run out before then

## 2020-07-31 NOTE — Telephone Encounter (Signed)
Patient requesting Dr Jenny Reichmann write order for  furosemide (LASIX) 40 MG tablet potassium chloride SA (KLOR-CON) 20 MEQ tablet Pharmacy [facility] [place]  They were given in the hospital.

## 2020-07-31 NOTE — Telephone Encounter (Signed)
Vicente Males notified Dr Jenny Reichmann resolved the problem

## 2020-08-03 NOTE — Telephone Encounter (Signed)
Patient notified  These medications were done 1 mo courtesy only  Please see cardiology for further refills as I think this was intended   I see pt is for cardiology related procedure mar 18 but meds will run out before then

## 2020-08-11 ENCOUNTER — Other Ambulatory Visit: Payer: Medicare Other | Admitting: *Deleted

## 2020-08-11 ENCOUNTER — Ambulatory Visit: Payer: Medicare Other

## 2020-08-11 ENCOUNTER — Other Ambulatory Visit: Payer: Self-pay

## 2020-08-11 ENCOUNTER — Other Ambulatory Visit (HOSPITAL_COMMUNITY)
Admission: RE | Admit: 2020-08-11 | Discharge: 2020-08-11 | Disposition: A | Discharge status: Home / Self Care | Payer: Medicare Other | Source: Ambulatory Visit | Attending: Internal Medicine | Admitting: Internal Medicine

## 2020-08-11 DIAGNOSIS — Z20822 Contact with and (suspected) exposure to covid-19: Secondary | ICD-10-CM | POA: Insufficient documentation

## 2020-08-11 DIAGNOSIS — Z01812 Encounter for preprocedural laboratory examination: Secondary | ICD-10-CM | POA: Diagnosis not present

## 2020-08-11 DIAGNOSIS — I442 Atrioventricular block, complete: Secondary | ICD-10-CM | POA: Diagnosis not present

## 2020-08-11 DIAGNOSIS — I5042 Chronic combined systolic (congestive) and diastolic (congestive) heart failure: Secondary | ICD-10-CM | POA: Diagnosis not present

## 2020-08-11 LAB — SARS CORONAVIRUS 2 (TAT 6-24 HRS): SARS Coronavirus 2: NEGATIVE

## 2020-08-11 MED ORDER — POTASSIUM CHLORIDE CRYS ER 20 MEQ PO TBCR: 20.0000 meq | EXTENDED_RELEASE_TABLET | Freq: Every day | ORAL | 3 refills | Status: DC

## 2020-08-11 MED ORDER — FUROSEMIDE 40 MG PO TABS: 40.0000 mg | ORAL_TABLET | Freq: Every day | ORAL | 3 refills | Status: DC

## 2020-08-12 LAB — BASIC METABOLIC PANEL
BUN/Creatinine Ratio: 24 (ref 10–24)
BUN: 43 mg/dL — ABNORMAL HIGH (ref 8–27)
CO2: 23 mmol/L (ref 20–29)
Calcium: 9.9 mg/dL (ref 8.6–10.2)
Chloride: 98 mmol/L (ref 96–106)
Creatinine, Ser: 1.82 mg/dL — ABNORMAL HIGH (ref 0.76–1.27)
Glucose: 115 mg/dL — ABNORMAL HIGH (ref 65–99)
Potassium: 5.1 mmol/L (ref 3.5–5.2)
Sodium: 138 mmol/L (ref 134–144)
eGFR: 35 mL/min/{1.73_m2} — ABNORMAL LOW (ref >59)

## 2020-08-12 LAB — CBC WITH DIFFERENTIAL/PLATELET
Basophils Absolute: 0.1 10*3/uL (ref 0.0–0.2)
Basos: 1 %
EOS (ABSOLUTE): 0.2 10*3/uL (ref 0.0–0.4)
Eos: 3 %
Hematocrit: 51.7 % — ABNORMAL HIGH (ref 37.5–51.0)
Hemoglobin: 16.6 g/dL (ref 13.0–17.7)
Immature Grans (Abs): 0 10*3/uL (ref 0.0–0.1)
Immature Granulocytes: 0 %
Lymphocytes Absolute: 1.3 10*3/uL (ref 0.7–3.1)
Lymphs: 20 %
MCH: 27.9 pg (ref 26.6–33.0)
MCHC: 32.1 g/dL (ref 31.5–35.7)
MCV: 87 fL (ref 79–97)
Monocytes Absolute: 0.9 10*3/uL (ref 0.1–0.9)
Monocytes: 14 %
Neutrophils Absolute: 4.2 10*3/uL (ref 1.4–7.0)
Neutrophils: 62 %
Platelets: 174 10*3/uL (ref 150–450)
RBC: 5.96 x10E6/uL — ABNORMAL HIGH (ref 4.14–5.80)
RDW: 16 % — ABNORMAL HIGH (ref 11.6–15.4)
WBC: 6.7 10*3/uL (ref 3.4–10.8)

## 2020-08-14 ENCOUNTER — Ambulatory Visit (HOSPITAL_COMMUNITY)
Admission: RE | Disposition: A | Discharge status: Home / Self Care | Payer: Medicare Other | Source: Home / Self Care | Attending: Internal Medicine

## 2020-08-14 ENCOUNTER — Other Ambulatory Visit: Payer: Self-pay

## 2020-08-14 ENCOUNTER — Ambulatory Visit (HOSPITAL_COMMUNITY)
Admission: RE | Admit: 2020-08-14 | Discharge: 2020-08-14 | Disposition: A | Discharge status: Home / Self Care | Payer: Medicare Other | Attending: Internal Medicine | Admitting: Internal Medicine

## 2020-08-14 ENCOUNTER — Ambulatory Visit (HOSPITAL_COMMUNITY): Payer: Medicare Other

## 2020-08-14 DIAGNOSIS — Z79899 Other long term (current) drug therapy: Secondary | ICD-10-CM | POA: Diagnosis not present

## 2020-08-14 DIAGNOSIS — E119 Type 2 diabetes mellitus without complications: Secondary | ICD-10-CM | POA: Insufficient documentation

## 2020-08-14 DIAGNOSIS — E785 Hyperlipidemia, unspecified: Secondary | ICD-10-CM | POA: Insufficient documentation

## 2020-08-14 DIAGNOSIS — Z7982 Long term (current) use of aspirin: Secondary | ICD-10-CM | POA: Diagnosis not present

## 2020-08-14 DIAGNOSIS — I110 Hypertensive heart disease with heart failure: Secondary | ICD-10-CM | POA: Insufficient documentation

## 2020-08-14 DIAGNOSIS — I4891 Unspecified atrial fibrillation: Secondary | ICD-10-CM | POA: Diagnosis not present

## 2020-08-14 DIAGNOSIS — I739 Peripheral vascular disease, unspecified: Secondary | ICD-10-CM | POA: Diagnosis not present

## 2020-08-14 DIAGNOSIS — Z87891 Personal history of nicotine dependence: Secondary | ICD-10-CM | POA: Insufficient documentation

## 2020-08-14 DIAGNOSIS — Z8249 Family history of ischemic heart disease and other diseases of the circulatory system: Secondary | ICD-10-CM | POA: Diagnosis not present

## 2020-08-14 DIAGNOSIS — I779 Disorder of arteries and arterioles, unspecified: Secondary | ICD-10-CM | POA: Insufficient documentation

## 2020-08-14 DIAGNOSIS — Z888 Allergy status to other drugs, medicaments and biological substances status: Secondary | ICD-10-CM | POA: Insufficient documentation

## 2020-08-14 DIAGNOSIS — Z903 Acquired absence of stomach [part of]: Secondary | ICD-10-CM | POA: Insufficient documentation

## 2020-08-14 DIAGNOSIS — I5022 Chronic systolic (congestive) heart failure: Secondary | ICD-10-CM | POA: Insufficient documentation

## 2020-08-14 DIAGNOSIS — Z8619 Personal history of other infectious and parasitic diseases: Secondary | ICD-10-CM | POA: Insufficient documentation

## 2020-08-14 DIAGNOSIS — I442 Atrioventricular block, complete: Secondary | ICD-10-CM | POA: Diagnosis not present

## 2020-08-14 DIAGNOSIS — Z950 Presence of cardiac pacemaker: Secondary | ICD-10-CM

## 2020-08-14 DIAGNOSIS — J449 Chronic obstructive pulmonary disease, unspecified: Secondary | ICD-10-CM | POA: Diagnosis not present

## 2020-08-14 HISTORY — PX: BIV UPGRADE: EP1202

## 2020-08-14 SURGERY — BIV UPGRADE

## 2020-08-14 MED ORDER — ONDANSETRON HCL 4 MG/2ML IJ SOLN: 4.0000 mg | Freq: Four times a day (QID) | INTRAMUSCULAR | Status: DC | PRN

## 2020-08-14 MED ORDER — SODIUM CHLORIDE 0.9 % IV SOLN
80.0000 mg | INTRAVENOUS | Status: AC
  Administered 2020-08-14: 80 mg

## 2020-08-14 MED ORDER — POVIDONE-IODINE 10 % EX SWAB
2.0000 "application " | Freq: Once | CUTANEOUS | Status: AC
  Administered 2020-08-14: 2 via TOPICAL

## 2020-08-14 MED ORDER — CEFAZOLIN SODIUM-DEXTROSE 1-4 GM/50ML-% IV SOLN
1.0000 g | Freq: Once | INTRAVENOUS | Status: AC
  Administered 2020-08-14: 1 g via INTRAVENOUS
  Filled 2020-08-14: qty 50

## 2020-08-14 MED ORDER — CEFAZOLIN SODIUM-DEXTROSE 2-4 GM/100ML-% IV SOLN
INTRAVENOUS | Status: AC
  Filled 2020-08-14: qty 100

## 2020-08-14 MED ORDER — MIDAZOLAM HCL 5 MG/5ML IJ SOLN
INTRAMUSCULAR | Status: DC | PRN
  Administered 2020-08-14 (×3): 1 mg via INTRAVENOUS

## 2020-08-14 MED ORDER — SODIUM CHLORIDE 0.9 % IV SOLN: INTRAVENOUS | Status: DC

## 2020-08-14 MED ORDER — LIDOCAINE HCL (PF) 1 % IJ SOLN
INTRAMUSCULAR | Status: DC | PRN
  Administered 2020-08-14: 60 mL

## 2020-08-14 MED ORDER — HEPARIN (PORCINE) IN NACL 1000-0.9 UT/500ML-% IV SOLN
INTRAVENOUS | Status: AC
  Filled 2020-08-14: qty 500

## 2020-08-14 MED ORDER — CEFAZOLIN SODIUM-DEXTROSE 2-4 GM/100ML-% IV SOLN
2.0000 g | INTRAVENOUS | Status: AC
  Administered 2020-08-14: 2 g via INTRAVENOUS

## 2020-08-14 MED ORDER — HEPARIN (PORCINE) IN NACL 1000-0.9 UT/500ML-% IV SOLN
INTRAVENOUS | Status: DC | PRN
  Administered 2020-08-14: 500 mL

## 2020-08-14 MED ORDER — ACETAMINOPHEN 325 MG PO TABS: 325.0000 mg | ORAL_TABLET | ORAL | Status: DC | PRN

## 2020-08-14 MED ORDER — CHLORHEXIDINE GLUCONATE 4 % EX LIQD: 4.0000 "application " | Freq: Once | CUTANEOUS | Status: DC

## 2020-08-14 MED ORDER — FENTANYL CITRATE (PF) 100 MCG/2ML IJ SOLN
INTRAMUSCULAR | Status: AC
  Filled 2020-08-14: qty 2

## 2020-08-14 MED ORDER — FENTANYL CITRATE (PF) 100 MCG/2ML IJ SOLN
INTRAMUSCULAR | Status: DC | PRN
  Administered 2020-08-14 (×3): 12.5 ug via INTRAVENOUS

## 2020-08-14 MED ORDER — SODIUM CHLORIDE 0.9 % IV SOLN
INTRAVENOUS | Status: AC
  Filled 2020-08-14: qty 2

## 2020-08-14 MED ORDER — IOHEXOL 350 MG/ML SOLN
INTRAVENOUS | Status: DC | PRN
  Administered 2020-08-14: 10 mL
  Administered 2020-08-14 (×2): 15 mL

## 2020-08-14 MED ORDER — MIDAZOLAM HCL 5 MG/5ML IJ SOLN
INTRAMUSCULAR | Status: AC
  Filled 2020-08-14: qty 5

## 2020-08-14 MED ORDER — LIDOCAINE HCL 1 % IJ SOLN
INTRAMUSCULAR | Status: AC
  Filled 2020-08-14: qty 60

## 2020-08-14 SURGICAL SUPPLY — 11 items
CABLE SURGICAL S-101-97-12 (CABLE) ×2 IMPLANT
CATH ATTAIN COM SURV 6250V-MB2 (CATHETERS) ×1 IMPLANT
CATH JOSEPHSON 2-5-2 125 (CATHETERS) ×1 IMPLANT
DEVICE CRTP PERCEPTA QUAD MRI (Pacemaker) ×1 IMPLANT
GUIDEWIRE ANGLED .035X150CM (WIRE) ×1 IMPLANT
LEAD ATTAIN PERFORMA S 4598-88 (Lead) ×1 IMPLANT
PAD PRO RADIOLUCENT 2001M-C (PAD) ×2 IMPLANT
SHEATH 9.5FR PRELUDE SNAP 13 (SHEATH) ×1 IMPLANT
SLITTER 6232ADJ (MISCELLANEOUS) ×1 IMPLANT
TRAY PACEMAKER INSERTION (PACKS) ×2 IMPLANT
WIRE HI TORQ WHISPER MS 190CM (WIRE) ×1 IMPLANT

## 2020-08-14 NOTE — Discharge Instructions (Signed)
    Supplemental Discharge Instructions for  Pacemaker/Defibrillator Patients  Tomorrow, 08/15/20, send in a device transmission  Activity No heavy lifting or vigorous activity with your left/right arm for 6 to 8 weeks.  Do not raise your left/right arm above your head for one week.  Gradually raise your affected arm as drawn below.             08/19/20                      08/20/20                    08/21/20                  08/22/20 __  NO DRIVING until cleared to at your wound check visit  WOUND CARE - Keep the wound area clean and dry.  Do not get this area wet , no showers until cleared to at your wound check visit . - Tomorrow, 08/15/20, remove the arm sling - Tomorrow, 08/15/20 remove the large outer plastic bandage.  Underneath the plastic bandage there are steri strips (paper tapes), DO NOT remove these. - The tape/steri-strips on your wound will fall off; do not pull them off.  No bandage is needed on the site.  DO  NOT apply any creams, oils, or ointments to the wound area. - If you notice any drainage or discharge from the wound, any swelling or bruising at the site, or you develop a fever > 101? F after you are discharged home, call the office at once.  Special Instructions - You are still able to use cellular telephones; use the ear opposite the side where you have your pacemaker/defibrillator.  Avoid carrying your cellular phone near your device. - When traveling through airports, show security personnel your identification card to avoid being screened in the metal detectors.  Ask the security personnel to use the hand wand. - Avoid arc welding equipment, MRI testing (magnetic resonance imaging), TENS units (transcutaneous nerve stimulators).  Call the office for questions about other devices. - Avoid electrical appliances that are in poor condition or are not properly grounded. - Microwave ovens are safe to be near or to operate.

## 2020-08-14 NOTE — H&P (Signed)
HPI Daniel Woodard returns today for followup. He is a pleasant 87 yo man with chb, s/p PPM insertion, HTN, carotid vascular disease, who has done well but notes occaisional episodes of dyspnea. The patient tries to avoid sodium excess. He denies syncope or peripheral edema or chest pain. He was in the hospital a few weeks ago with acute CHF and his echo demonstrated an EF of 30%. He has been treated with diuretic therapy. He feels better. He is fairly sedentary.      Allergies  Allergen Reactions  . Ace Inhibitors Cough                Current Outpatient Medications  Medication Sig Dispense Refill  . albuterol (VENTOLIN HFA) 108 (90 Base) MCG/ACT inhaler Inhale 2 puffs into the lungs every 6 (six) hours as needed for wheezing or shortness of breath. 8 g 6  . aspirin 325 MG tablet Take 1 tablet (325 mg total) by mouth daily.    . Coenzyme Q10 (COQ-10) 200 MG CAPS Take 200 mg by mouth daily after lunch.    . furosemide (LASIX) 40 MG tablet Take 1 tablet (40 mg total) by mouth daily. 30 tablet 1  . metoprolol succinate (TOPROL-XL) 25 MG 24 hr tablet Take 1/2 (one-half) tablet by mouth once daily 45 tablet 0  . omeprazole (PRILOSEC) 20 MG capsule TAKE 1 CAPSULE BY MOUTH ONCE DAILY 90 capsule 3  . potassium chloride SA (KLOR-CON) 20 MEQ tablet Take 1 tablet (20 mEq total) by mouth daily. 30 tablet 1  . Psyllium (METAMUCIL PO) Take 2 capsules by mouth daily.    . rosuvastatin (CRESTOR) 20 MG tablet Take 2 tablets (40 mg total) by mouth every other day. 90 tablet 3  . VITAMIN D PO Take 1 tablet by mouth daily.     No current facility-administered medications for this visit.         Past Medical History:  Diagnosis Date  . ALLERGIC RHINITIS 04/09/2007  . Atrial fibrillation ([place])   . BACTEREMIA, 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  . BENIGN PROSTATIC HYPERTROPHY 04/09/2007  . Blood transfusion without reported diagnosis    really not sure thinks  30-40 years ago  . BRADYCARDIA 05/11/2009  . Carotid artery occlusion   . COLONIC POLYPS, HX OF 04/09/2007   ADENOMATOUS POLYPS 2025,4270  . Complete heart block ([place])   . COPD 04/09/2007  . Cough 03/27/2008  . DEPRESSION 04/09/2007  . DIABETES MELLITUS, TYPE II 10/09/2007  . DIVERTICULOSIS, COLON 04/09/2007  . DIZZINESS 10/06/2008  . GLUCOSE INTOLERANCE 04/09/2007  . HYPERLIPIDEMIA 03/27/2007  . HYPERTENSION 03/27/2007  . PERIPHERAL VASCULAR DISEASE 03/27/2007  . PNEUMONIA ORGANISM NOS 11/08/2007  . SHINGLES, HX OF 04/09/2007  . SHOULDER PAIN, BILATERAL 08/01/2007  . Ulcer    40 years ago    ROS:   All systems reviewed and negative except as noted in the HPI.        Past Surgical History:  Procedure Laterality Date  . APPENDECTOMY     87 years old  . CAROTID ENDARTERECTOMY  5/08   left  . CARPAL TUNNEL RELEASE     bilateral  . COLONOSCOPY    . ESOPHAGOGASTRODUODENOSCOPY  06/07/2004  . HEMORRHOID BANDING  1990s  . LEFT HEART CATH AND CORONARY ANGIOGRAPHY N/A 06/02/2020   Procedure: LEFT HEART CATH AND CORONARY ANGIOGRAPHY;  Surgeon: Jettie Booze, MD;  Location: [place] [facility];  Service: Cardiovascular;  Laterality: N/A;  . PACEMAKER  INSERTION  07/11/2013   MDT ADDRL1 pacemaker implanted by Dr Lovena Le for complete heart block  . PERMANENT PACEMAKER INSERTION N/A 07/11/2013   Procedure: PERMANENT PACEMAKER INSERTION;  Surgeon: Evans Lance, MD;  Location: [facility] CATH LAB;  Service: Cardiovascular;  Laterality: N/A;  . s/p PUD surgury     ? partial gastrectomy          Family History  Problem Relation Age of Onset  . Hypertension Father   . Ulcers Father   . Hypertension Mother   . Hypertension Sister   . Peripheral vascular disease Brother   . Hypertension Sister   . Ulcers Sister        Ulcers  . Hypertension Sister   . Lung cancer Sister        smoked  . Heart disease Sister        Carotid   . Hypertension  Sister   . Heart disease Other        Cardiovascular disorder, CHF  . Stroke Other        1st degree relative male and male  . Colon cancer Neg Hx      Social History        Socioeconomic History  . Marital status: Widowed    Spouse name: Not on file  . Number of children: 2  . Years of education: Not on file  . Highest education level: Not on file  Occupational History  . 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 Smoking status: Former Smoker    Packs/day: 1.00    Years: 50.00    Pack years: 50.00    Types: Cigarettes    Quit date: 08/20/1999    Years since quitting: 20.8  . Smokeless tobacco: Never Used  Vaping Use  . Vaping Use: Never used  Substance and Sexual Activity  . Alcohol use: No    Alcohol/week: 0.0 standard drinks  . Drug use: No  . Sexual activity: Not on file  Other Topics Concern  . Not on file  Social History Narrative   Widowed; wife dies 2018-08-12   Daily caffeine    Social Determinants of Health   Financial Resource Strain: Not on file  Food Insecurity: Not on file  Transportation Needs: Not on file  Physical Activity: Not on file  Stress: Not on file  Social Connections: Not on file  Intimate Partner Violence: Not on file     BP 100/70   Pulse 77   Ht _0  (1.854 m)   Wt 170 lb 9.6 oz (77.4 kg)   SpO2 93%   BMI 22.51 kg/m   Physical Exam:  Well appearing NAD HEENT: Unremarkable Neck:  No JVD, no thyromegally Lymphatics:  No adenopathy Back:  No CVA tenderness Lungs:  Clear with no wheezes HEART:  Regular rate rhythm, no murmurs, no rubs, no clicks Abd:  soft, positive bowel sounds, no organomegally, no rebound, no guarding Ext:  2 plus pulses, no edema, no cyanosis, no clubbing Skin:  No rashes no nodules Neuro:  CN II through XII intact, motor grossly intact    DEVICE  Normal device function.  See PaceArt for details.    Assess/Plan: 1. CHB - he is asymptomatic, s/p PPM insertion.  2. Acute systolic heart failure  - he is better with lasix. He has an EF of 30%. I discussed cardiac resynchronization.  He is encouraged to maintain a low sodium diet. 3. PPM -  his medtronic DDD PM is working normally. He will consider upgrade to a biv PPM and call us if he would like to schedule. 4. HTN - his bp is well controlled. He will continue his current meds.  Daniel Woodard  EP Attending  Patient seen and examined. Agree with above. The patient presents for upgrade from a DDD PM to a biv PM in the setting of CHB and LV dysfunction and pacing induced LBBB. I have reviewed the indications/risks/benefits/goals/expectations and he wishes to proceed.  Daniel Overlie Nichael Ehly,MD

## 2020-08-14 NOTE — Progress Notes (Signed)
Dr Lovena Le in and per Dr Lovena Le ok to d/c home

## 2020-08-17 ENCOUNTER — Telehealth: Payer: Self-pay

## 2020-08-17 ENCOUNTER — Encounter (HOSPITAL_COMMUNITY): Payer: Self-pay | Admitting: Internal Medicine

## 2020-08-17 MED FILL — Lidocaine HCl Local Inj 1%: INTRAMUSCULAR | Qty: 60 | Status: AC

## 2020-08-17 NOTE — Telephone Encounter (Signed)
-----   Message from [place], [place] sent at 08/14/2020 12:11 PM EDT ----- Same day d/c  MDT upgrade CRT-P  GT

## 2020-08-17 NOTE — Telephone Encounter (Signed)
error 

## 2020-08-17 NOTE — Telephone Encounter (Signed)
Follow-up after same day discharge:  Implant date: 08/14/20 MD: Cristopher Peru, MD Device: Medtronic Percepta Quad CRT-P Location: Left Chest   Wound check visit: 08/25/20 at 2:40 pm 90 day MD follow-up: 11/18/20 at 2:30  Remote Transmission received:08/15/20  Dressing removed: yes Sling removed: yes  Patient denied complaints. Steri strips intact. Patient following left arm restrictions. Above appointments confirmed. Patient provided device clinic contact # at [phone].

## 2020-08-19 ENCOUNTER — Other Ambulatory Visit: Payer: Self-pay | Admitting: Internal Medicine

## 2020-08-19 NOTE — Telephone Encounter (Signed)
Please refill as per office routine med refill policy (all routine meds refilled for 3 mo or monthly per pt preference up to one year from last visit, then month to month grace period for 3 mo, then further med refills will have to be denied)  

## 2020-08-20 ENCOUNTER — Other Ambulatory Visit: Payer: Self-pay | Admitting: Internal Medicine

## 2020-08-25 ENCOUNTER — Other Ambulatory Visit: Payer: Self-pay

## 2020-08-25 ENCOUNTER — Ambulatory Visit (INDEPENDENT_AMBULATORY_CARE_PROVIDER_SITE_OTHER): Payer: Medicare Other | Admitting: Emergency Medicine

## 2020-08-25 DIAGNOSIS — I442 Atrioventricular block, complete: Secondary | ICD-10-CM | POA: Diagnosis not present

## 2020-08-25 LAB — CUP PACEART INCLINIC DEVICE CHECK
Battery Remaining Longevity: 124 mo
Battery Voltage: 3.21 V
Brady Statistic AP VP Percent: 27.93 %
Brady Statistic AP VS Percent: 0.01 %
Brady Statistic AS VP Percent: 72.01 %
Brady Statistic AS VS Percent: 0.04 %
Brady Statistic RA Percent Paced: 27.64 %
Brady Statistic RV Percent Paced: 99.94 %
Date Time Interrogation Session: 20220329150652
Implantable Lead Implant Date: 20150212
Implantable Lead Implant Date: 20150212
Implantable Lead Implant Date: 20220318
Implantable Lead Location: 753858
Implantable Lead Location: 753859
Implantable Lead Location: 753860
Implantable Lead Model: 4598
Implantable Lead Model: 5076
Implantable Lead Model: 5076
Implantable Pulse Generator Implant Date: 20220318
Lead Channel Impedance Value: 361 Ohm
Lead Channel Impedance Value: 380 Ohm
Lead Channel Impedance Value: 418 Ohm
Lead Channel Impedance Value: 456 Ohm
Lead Channel Impedance Value: 456 Ohm
Lead Channel Impedance Value: 513 Ohm
Lead Channel Impedance Value: 513 Ohm
Lead Channel Impedance Value: 532 Ohm
Lead Channel Impedance Value: 703 Ohm
Lead Channel Impedance Value: 798 Ohm
Lead Channel Impedance Value: 836 Ohm
Lead Channel Impedance Value: 855 Ohm
Lead Channel Impedance Value: 855 Ohm
Lead Channel Impedance Value: 950 Ohm
Lead Channel Pacing Threshold Amplitude: 0.75 V
Lead Channel Pacing Threshold Amplitude: 1 V
Lead Channel Pacing Threshold Amplitude: 1 V
Lead Channel Pacing Threshold Pulse Width: 0.4 ms
Lead Channel Pacing Threshold Pulse Width: 0.4 ms
Lead Channel Pacing Threshold Pulse Width: 0.5 ms
Lead Channel Sensing Intrinsic Amplitude: 18.5 mV
Lead Channel Sensing Intrinsic Amplitude: 6.125 mV
Lead Channel Setting Pacing Amplitude: 2 V
Lead Channel Setting Pacing Amplitude: 2.25 V
Lead Channel Setting Pacing Amplitude: 2.25 V
Lead Channel Setting Pacing Pulse Width: 0.4 ms
Lead Channel Setting Pacing Pulse Width: 0.5 ms
Lead Channel Setting Sensing Sensitivity: 0.9 mV

## 2020-08-25 NOTE — Progress Notes (Signed)
CRT-P device and wound check in clinic. Steri-strips removed from incision site, wound edges approximated with no redness, drainage or bleeding Normal device function. Thresholds, sensing, impedance consistent with previous measurements. Histograms appropriate for patient and level of activity. No mode switches or ventricular high rate episodes. Patient bi-ventricularly pacing 99.91% of the time. Device programmed with appropriate safety margins for chronic leads in RA and Rv. LV output programmed at 3.5 V until 91 day follow-up. Device heart failure diagnostics are within normal limits and stable over time. Estimated longevity 10 years 4 months. Patient enrolled in remote follow-up and next remote 11/13/20. Follow-up with Dr Lovena Le 11/18/20.

## 2020-08-25 NOTE — Patient Instructions (Signed)
Apply ice pack to wound site for 20 minutes  , 3 times a day. Call the device clinic if you have any increased swelling, or any drainage or bleeding from wound site.

## 2020-09-10 ENCOUNTER — Encounter: Payer: Self-pay | Admitting: Internal Medicine

## 2020-09-10 ENCOUNTER — Other Ambulatory Visit: Payer: Self-pay

## 2020-09-10 ENCOUNTER — Ambulatory Visit: Payer: Medicare Other | Admitting: Internal Medicine

## 2020-09-10 VITALS — BP 128/82 | HR 73 | Temp 97.7°F | Ht 72.0 in | Wt 178.0 lb

## 2020-09-10 DIAGNOSIS — E559 Vitamin D deficiency, unspecified: Secondary | ICD-10-CM

## 2020-09-10 DIAGNOSIS — J449 Chronic obstructive pulmonary disease, unspecified: Secondary | ICD-10-CM | POA: Diagnosis not present

## 2020-09-10 DIAGNOSIS — N1832 Chronic kidney disease, stage 3b: Secondary | ICD-10-CM

## 2020-09-10 DIAGNOSIS — E538 Deficiency of other specified B group vitamins: Secondary | ICD-10-CM

## 2020-09-10 DIAGNOSIS — E1165 Type 2 diabetes mellitus with hyperglycemia: Secondary | ICD-10-CM | POA: Diagnosis not present

## 2020-09-10 DIAGNOSIS — I700 Atherosclerosis of aorta: Secondary | ICD-10-CM

## 2020-09-10 DIAGNOSIS — I10 Essential (primary) hypertension: Secondary | ICD-10-CM

## 2020-09-10 NOTE — Progress Notes (Signed)
Patient ID: Daniel Woodard, male   DOB: [birth year], 87 y.o.   MRN: [id number]        Chief Complaint: follow up HTN, DM, ckd, copd       HPI:  Daniel Woodard is a 87 y.o. male here with family, overall doing very well.  Pt denies chest pain, increased sob or doe, wheezing, orthopnea, PND, increased LE swelling, palpitations, dizziness or syncope.  Denies new neuro focal s/s.   Pt denies polydipsia, polyuria,  Pt states overall good compliance with meds, trying to follow lower cholesterol, diabetic diet, wt overall increased several lbs but little exercise however.  Plans to see optho yearly soon.  Has no other new complaints Wt Readings from Last 3 Encounters:  09/10/20 178 lb (80.7 kg)  08/14/20 169 lb 8 oz (76.9 kg)  07/16/20 172 lb (78 kg)   BP Readings from Last 3 Encounters:  09/10/20 128/82  08/14/20 (!) 162/65  07/16/20 118/70         Past Medical History:  Diagnosis Date  . ALLERGIC RHINITIS 04/09/2007  . Atrial fibrillation ([place])   . BACTEREMIA, 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  . BENIGN PROSTATIC HYPERTROPHY 04/09/2007  . Blood transfusion without reported diagnosis    really not sure thinks 30-40 years ago  . BRADYCARDIA 05/11/2009  . Carotid artery occlusion   . COLONIC POLYPS, HX OF 04/09/2007   ADENOMATOUS POLYPS 0092,3300  . Complete heart block ([place])   . COPD 04/09/2007  . Cough 03/27/2008  . DEPRESSION 04/09/2007  . DIABETES MELLITUS, TYPE II 10/09/2007  . DIVERTICULOSIS, COLON 04/09/2007  . DIZZINESS 10/06/2008  . GLUCOSE INTOLERANCE 04/09/2007  . HYPERLIPIDEMIA 03/27/2007  . HYPERTENSION 03/27/2007  . PERIPHERAL VASCULAR DISEASE 03/27/2007  . PNEUMONIA ORGANISM NOS 11/08/2007  . SHINGLES, HX OF 04/09/2007  . SHOULDER PAIN, BILATERAL 08/01/2007  . Ulcer    40 years ago   Past Surgical History:  Procedure Laterality Date  . APPENDECTOMY     87 years old  . BIV UPGRADE N/A 08/14/2020   Procedure: BIV PPM UPGRADE;  Surgeon: Evans Lance, MD;   Location: [place] CV LAB;  Service: Cardiovascular;  Laterality: N/A;  . CAROTID ENDARTERECTOMY  5/08   left  . CARPAL TUNNEL RELEASE     bilateral  . COLONOSCOPY    . ESOPHAGOGASTRODUODENOSCOPY  06/07/2004  . HEMORRHOID BANDING  1990s  . LEFT HEART CATH AND CORONARY ANGIOGRAPHY N/A 06/02/2020   Procedure: LEFT HEART CATH AND CORONARY ANGIOGRAPHY;  Surgeon: Jettie Booze, MD;  Location: [place] [facility] LAB;  Service: Cardiovascular;  Laterality: N/A;  . PACEMAKER INSERTION  07/11/2013   MDT ADDRL1 pacemaker implanted by Dr Lovena Le for complete heart block  . PERMANENT PACEMAKER INSERTION N/A 07/11/2013   Procedure: PERMANENT PACEMAKER INSERTION;  Surgeon: Evans Lance, MD;  Location: [facility] CATH LAB;  Service: Cardiovascular;  Laterality: N/A;  . s/p PUD surgury     ? partial gastrectomy    reports that he quit smoking about 21 years ago. His smoking use included cigarettes. He has a 50.00 pack-year smoking history. He has never used smokeless tobacco. He reports that he does not drink alcohol and does not use drugs. family history includes Heart disease in his sister and another family member; Hypertension in his father, mother, sister, sister, sister, and sister; Lung cancer in his sister; Peripheral vascular disease in his brother; Stroke in an other family member; Ulcers in his father and sister. Allergies  Allergen Reactions  .  Ace Inhibitors Cough        Current Outpatient Medications on File Prior to Visit  Medication Sig Dispense Refill  . albuterol (VENTOLIN HFA) 108 (90 Base) MCG/ACT inhaler Inhale 2 puffs into the lungs every 6 (six) hours as needed for wheezing or shortness of breath. 8 g 6  . aspirin 325 MG tablet Take 1 tablet (325 mg total) by mouth daily.    . Coenzyme Q10 (COQ-10) 200 MG CAPS Take 200 mg by mouth daily after lunch.    . furosemide (LASIX) 40 MG tablet Take 1 tablet by mouth once daily 90 tablet 1  . metoprolol succinate (TOPROL-XL) 25 MG 24 hr tablet  Take 1/2 (one-half) tablet by mouth once daily 45 tablet 0  . omeprazole (PRILOSEC) 20 MG capsule TAKE 1 CAPSULE BY MOUTH ONCE DAILY (Patient taking differently: Take 20 mg by mouth daily. TAKE 1 CAPSULE BY MOUTH ONCE DAILY) 90 capsule 3  . potassium chloride SA (KLOR-CON) 20 MEQ tablet Take 1 tablet (20 mEq total) by mouth daily. 90 tablet 3  . Psyllium (METAMUCIL PO) Take 2 capsules by mouth daily as needed (constipation).    . rosuvastatin (CRESTOR) 20 MG tablet Take 1 tablet (20 mg total) by mouth every other day. 45 tablet 3  . VITAMIN D PO Take 1,000 Units by mouth daily.     No current facility-administered medications on file prior to visit.        ROS:  All others reviewed and negative.  Objective        PE:  BP 128/82 (BP Location: Right Arm, Patient Position: Sitting, Cuff Size: Normal)   Pulse 73   Temp 97.7 F (36.5 C) (Oral)   Ht 6' (1.829 m)   Wt 178 lb (80.7 kg)   SpO2 96%   BMI 24.14 kg/m                 Constitutional: Pt appears in NAD               HENT: Head: NCAT.                Right Ear: External ear normal.                 Left Ear: External ear normal.                Eyes: . Pupils are equal, round, and reactive to light. Conjunctivae and EOM are normal               Nose: without d/c or deformity               Neck: Neck supple. Gross normal ROM               Cardiovascular: Normal rate and regular rhythm.                 Pulmonary/Chest: Effort normal and breath sounds without rales or wheezing.                Abd:  Soft, NT, ND, + BS, no organomegaly               Neurological: Pt is alert. At baseline orientation, motor grossly intact               Skin: Skin is warm. No rashes, no other new lesions, LE edema - none               Psychiatric: Pt  behavior is normal without agitation   Micro: none  Cardiac tracings I have personally interpreted today:  none  Pertinent Radiological findings (summarize): none   Lab Results  Component Value Date    WBC 6.7 08/11/2020   HGB 16.6 08/11/2020   HCT 51.7 (H) 08/11/2020   PLT 174 08/11/2020   GLUCOSE 115 (H) 08/11/2020   CHOL 180 11/20/2019   TRIG 179.0 (H) 11/20/2019   HDL 35.80 (L) 11/20/2019   LDLDIRECT 189.9 10/02/2007   LDLCALC 108 (H) 11/20/2019   ALT 13 11/20/2019   AST 16 11/20/2019   NA 138 08/11/2020   K 5.1 08/11/2020   CL 98 08/11/2020   CREATININE 1.82 (H) 08/11/2020   BUN 43 (H) 08/11/2020   CO2 23 08/11/2020   TSH 4.35 07/09/2020   PSA 0.92 11/20/2019   INR 1.1 03/06/2019   HGBA1C 6.1 (H) 06/02/2020   MICROALBUR 6.3 (H) 11/20/2019   Assessment/Plan:  Daniel Woodard is a 87 y.o. White or Caucasian [1] male with  has a past medical history of ALLERGIC RHINITIS (04/09/2007), Atrial fibrillation ([place]), BACTEREMIA, MYCOBACTERIUM AVIUM COMPLEX (04/09/2007), BENIGN PROSTATIC HYPERTROPHY (04/09/2007), Blood transfusion without reported diagnosis, BRADYCARDIA (05/11/2009), Carotid artery occlusion, COLONIC POLYPS, HX OF (04/09/2007), Complete heart block ([place]), COPD (04/09/2007), Cough (03/27/2008), DEPRESSION (04/09/2007), DIABETES MELLITUS, TYPE II (10/09/2007), DIVERTICULOSIS, COLON (04/09/2007), DIZZINESS (10/06/2008), GLUCOSE INTOLERANCE (04/09/2007), HYPERLIPIDEMIA (03/27/2007), HYPERTENSION (03/27/2007), PERIPHERAL VASCULAR DISEASE (03/27/2007), PNEUMONIA ORGANISM NOS (11/08/2007), SHINGLES, HX OF (04/09/2007), SHOULDER PAIN, BILATERAL (08/01/2007), and Ulcer.  COPD GOLD II / AB component  Stable, cont current med tx - ventolin hfa prn   Aortic atherosclerosis (HCC) Stable, to continue statin and low chol diet - crestor 20   Current Outpatient Medications (Cardiovascular):  .  furosemide (LASIX) 40 MG tablet, Take 1 tablet by mouth once daily .  metoprolol succinate (TOPROL-XL) 25 MG 24 hr tablet, Take 1/2 (one-half) tablet by mouth once daily .  rosuvastatin (CRESTOR) 20 MG tablet, Take 1 tablet (20 mg total) by mouth every other day.  Current Outpatient Medications  (Respiratory):  .  albuterol (VENTOLIN HFA) 108 (90 Base) MCG/ACT inhaler, Inhale 2 puffs into the lungs every 6 (six) hours as needed for wheezing or shortness of breath.  Current Outpatient Medications (Analgesics):  .  aspirin 325 MG tablet, Take 1 tablet (325 mg total) by mouth daily.   Current Outpatient Medications (Other):  Marland Kitchen  Coenzyme Q10 (COQ-10) 200 MG CAPS, Take 200 mg by mouth daily after lunch. Marland Kitchen  omeprazole (PRILOSEC) 20 MG capsule, TAKE 1 CAPSULE BY MOUTH ONCE DAILY (Patient taking differently: Take 20 mg by mouth daily. TAKE 1 CAPSULE BY MOUTH ONCE DAILY) .  potassium chloride SA (KLOR-CON) 20 MEQ tablet, Take 1 tablet (20 mEq total) by mouth daily. .  Psyllium (METAMUCIL PO), Take 2 capsules by mouth daily as needed (constipation). Marland Kitchen  VITAMIN D PO, Take 1,000 Units by mouth daily.   Essential hypertension BP Readings from Last 3 Encounters:  09/10/20 128/82  08/14/20 (!) 162/65  07/16/20 118/70   Stable, pt to continue medical treatment  - lasix, toprol   Diabetes ([place]) Lab Results  Component Value Date   HGBA1C 6.1 (H) 06/02/2020   Stable, pt to continue current medical treatment  - diet   CKD (chronic kidney disease), stage III ([place]) Lab Results  Component Value Date   CREATININE 1.82 (H) 08/11/2020   Stable overall, cont to avoid nephrotoxins  Followup: Return in about 6 months (around 03/12/2021).  Cathlean Cower,  MD 09/12/2020 6:39 PM [facility]

## 2020-09-10 NOTE — Patient Instructions (Signed)
Please continue all other medications as before, and refills have been done if requested.  Please have the pharmacy call with any other refills you may need.  Please continue your efforts at being more active, low cholesterol diet, and weight control.  Please keep your appointments with your specialists as you may have planned  Please make an Appointment to return in 6 months, or sooner if needed 

## 2020-09-12 ENCOUNTER — Encounter: Payer: Self-pay | Admitting: Internal Medicine

## 2020-09-12 NOTE — Assessment & Plan Note (Signed)
BP Readings from Last 3 Encounters:  09/10/20 128/82  08/14/20 (!) 162/65  07/16/20 118/70   Stable, pt to continue medical treatment  - lasix, toprol

## 2020-09-12 NOTE — Assessment & Plan Note (Signed)
Stable, to continue statin and low chol diet - crestor 20   Current Outpatient Medications (Cardiovascular):  .  furosemide (LASIX) 40 MG tablet, Take 1 tablet by mouth once daily .  metoprolol succinate (TOPROL-XL) 25 MG 24 hr tablet, Take 1/2 (one-half) tablet by mouth once daily .  rosuvastatin (CRESTOR) 20 MG tablet, Take 1 tablet (20 mg total) by mouth every other day.  Current Outpatient Medications (Respiratory):  .  albuterol (VENTOLIN HFA) 108 (90 Base) MCG/ACT inhaler, Inhale 2 puffs into the lungs every 6 (six) hours as needed for wheezing or shortness of breath.  Current Outpatient Medications (Analgesics):  .  aspirin 325 MG tablet, Take 1 tablet (325 mg total) by mouth daily.   Current Outpatient Medications (Other):  Marland Kitchen  Coenzyme Q10 (COQ-10) 200 MG CAPS, Take 200 mg by mouth daily after lunch. Marland Kitchen  omeprazole (PRILOSEC) 20 MG capsule, TAKE 1 CAPSULE BY MOUTH ONCE DAILY (Patient taking differently: Take 20 mg by mouth daily. TAKE 1 CAPSULE BY MOUTH ONCE DAILY) .  potassium chloride SA (KLOR-CON) 20 MEQ tablet, Take 1 tablet (20 mEq total) by mouth daily. .  Psyllium (METAMUCIL PO), Take 2 capsules by mouth daily as needed (constipation). Marland Kitchen  VITAMIN D PO, Take 1,000 Units by mouth daily.

## 2020-09-12 NOTE — Assessment & Plan Note (Signed)
Stable, cont current med tx - ventolin hfa prn  

## 2020-09-12 NOTE — Assessment & Plan Note (Signed)
Lab Results  Component Value Date   HGBA1C 6.1 (H) 06/02/2020   Stable, pt to continue current medical treatment  - diet

## 2020-09-12 NOTE — Assessment & Plan Note (Signed)
Lab Results  Component Value Date   CREATININE 1.82 (H) 08/11/2020   Stable overall, cont to avoid nephrotoxins

## 2020-09-25 IMAGING — CT CT ANGIO NECK
1 of 11 series · 5 of 33 positions shown · IV contrast (APPLIED)
Comparison: Noncontrast head CT performed earlier the same day
03/06/2019., MRA neck 04/29/2006.

CLINICAL DATA: Vertigo, persistent, central. Additional history
provided: Dizziness upon waking this morning, nausea. Per provider,
patient not a stroke code.

EXAM:
CT ANGIOGRAPHY HEAD AND NECK
TECHNIQUE: Multidetector CT imaging of the head and neck was performed using
the standard protocol during bolus administration of intravenous
contrast. Multiplanar CT image reconstructions and MIPs were
obtained to evaluate the vascular anatomy. Carotid stenosis
measurements (when applicable) are obtained utilizing NASCET
criteria, using the distal internal carotid diameter as the
denominator.
CONTRAST:  60mL OMNIPAQUE IOHEXOL 350 MG/ML SOLN

[Series 12: axial thin · axial · 0.41mm/px · z∈[-325,-96]mm · 5 of 345 slices shown]
[im 58/345  soft-tissue]
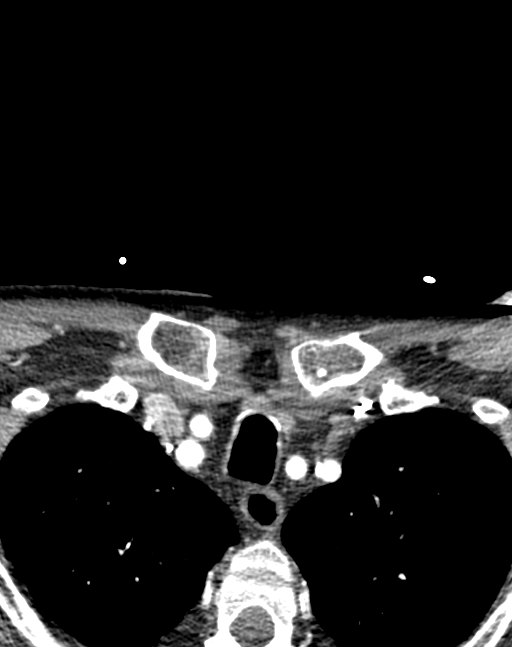
[im 115/345  bone]
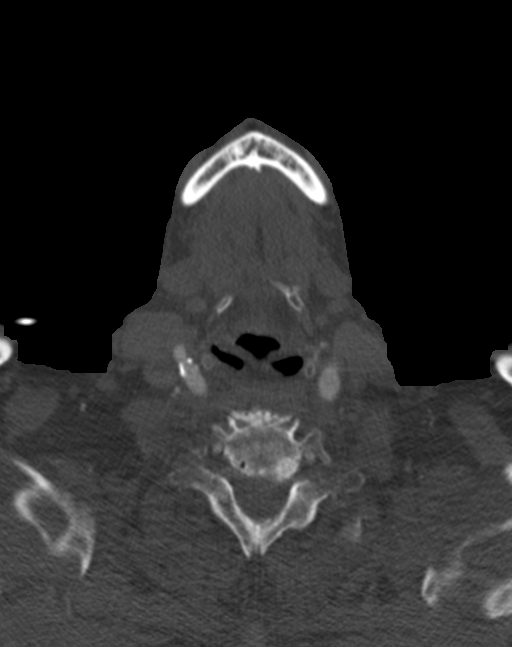
[im 173/345  soft-tissue]
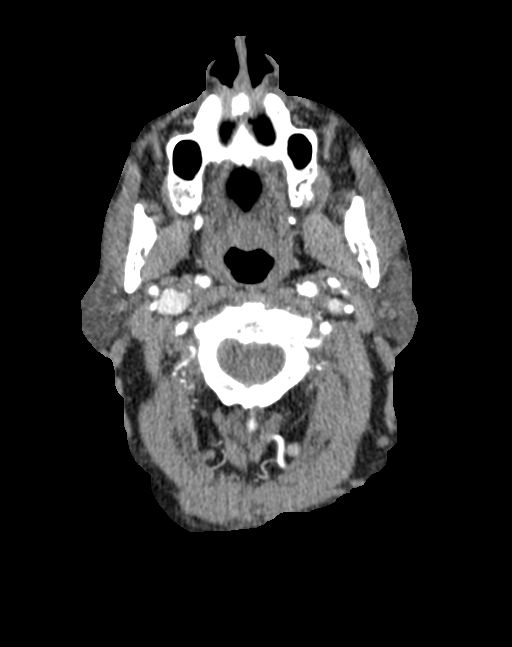
[im 230/345  bone]
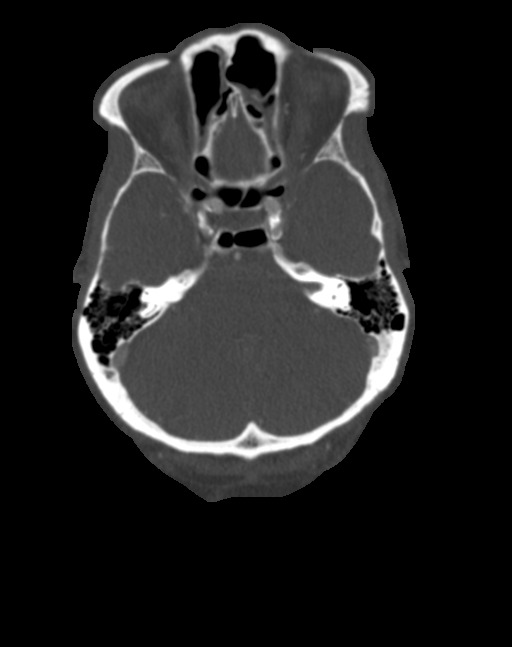
[im 287/345  soft-tissue]
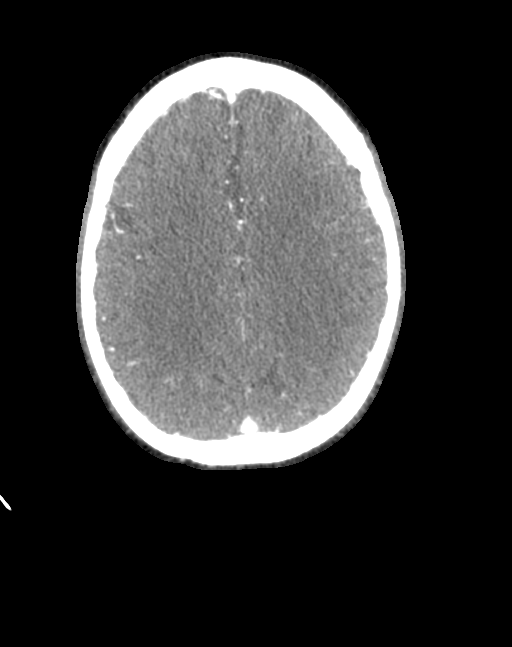

[5 of 33 positions shown; findings below may reference images not displayed]

FINDINGS: CT HEAD FINDINGS

Brain:

There is no evidence of acute intracranial hemorrhage. Calcification
within the bilateral basal ganglia.

Again demonstrated is extensive patchy and confluent hypodensity
within the cerebral white matter and to a lesser degree within the
cerebellar white matter, nonspecific but consistent with chronic
small vessel ischemic disease. No demarcated cortical infarction is
identified.

No evidence of intracranial mass. No midline shift or extra-axial
fluid collection. Mild generalized parenchymal atrophy.

Vascular: No hyperdense vessel. Atherosclerotic calcification of the
carotid artery siphons and vertebrobasilar system.

Skull: No calvarial fracture.

Sinuses: Minimal scattered paranasal sinus mucosal thickening. No
significant mastoid effusion

Orbits: Visualized orbits demonstrate no acute abnormality.

Review of the MIP images confirms the above findings

These results were called by telephone at the time of interpretation
on 03/06/2019 at [DATE] to provider [REDACTED], who verbally
acknowledged these results.

CTA NECK FINDINGS

Aortic arch: Included portions of the aortic arch demonstrate no
evidence of dissection or aneurysm. Prominent soft and calcified
plaque within the visualized aortic arch and proximal major branch
vessels of the neck. Prominent calcified plaque within the proximal
left subclavian artery with either severe stenosis or segmental
occlusion of the proximal left subclavian artery. Reconstitution
proximal to left vertebral artery takeoff.

Right carotid system: Common carotid artery patent without
significant stenosis. Prominent calcified plaque at the carotid
bifurcation and extending into the proximal ICA. Resultant
approximate 40% stenosis of the proximal ICA. Distal to this, the
ICA is patent within the neck without significant stenosis.

Left carotid system: CCA and ICA patent within the neck without
significant stenosis (50% or greater). Scattered soft and calcified
plaque.

Vertebral arteries: Calcified plaque at the origin of the right
vertebral artery with suspected at least moderate ostial stenosis.
Calcified plaque at the origin of the left vertebral artery with
moderate ostial stenosis. More distally, the bilateral vertebral
arteries are otherwise patent within the neck without significant
stenosis (50% or greater).

Skeleton: Cervical spondylosis with multilevel moderate/severe disc
height loss, small posterior disc osteophytes, uncovertebral and
facet hypertrophy. C4-C5 grade 1 anterolisthesis. No acute bony
abnormality.

Other neck: No soft tissue neck mass or pathologically enlarged
cervical chain lymph nodes. Thyroid unremarkable.

Upper chest: Moderate centrilobular emphysema within the imaged lung
apices. Partially visualized pacer leads.

Review of the MIP images confirms the above findings

CTA HEAD FINDINGS

Anterior circulation:

Calcified atherosclerotic disease within the bilateral intracranial
internal carotid arteries. Regions of mild stenosis within the
intracranial right internal carotid artery. Mild-to-moderate
stenosis within the cavernous left ICA.

The right middle cerebral artery is patent without significant
stenosis.

Hypoplastic the A1 right anterior cerebral artery. The A1 left
anterior cerebral artery predominantly supplies the bilateral A2
anterior cerebral arteries. Mild-to-moderate focal stenosis within
the proximal A2 right anterior cerebral artery. The left anterior
cerebral artery is patent without significant proximal stenosis.

The left middle cerebral artery is patent without significant
proximal stenosis.

Posterior circulation:

The intracranial vertebral arteries are patent without significant
stenosis. Flow is seen within the right PICA. The left PICA is
poorly delineated.

The basilar artery is patent. Mild focal stenosis within the mid
basilar artery. Flow is seen within the proximal left AICA. The
right AICA is poorly delineated. Flow is seen within the proximal
superior cerebellar arteries bilaterally.

Predominantly fetal origin of the right posterior cerebral artery.
The right posterior cerebral artery is patent without significant
proximal stenosis. The left posterior cerebral artery is patent
without significant proximal stenosis.

No intracranial aneurysm is identified

Venous sinuses: Within limitations of contrast timing, no convincing
thrombus.

Anatomic variants: As described.

Review of the MIP images confirms the above findings
IMPRESSION: CT head:

1. No evidence of acute intracranial hemorrhage or acute demarcated
cortical infarction.

2. Generalized parenchymal atrophy with extensive chronic small
vessel ischemic disease.

CTA head:

1. The left PICA and right AICA are poorly delineated. This may be
due to small vessel size. Stenoses within these vessels cannot be
excluded.
2. Intracranial atherosclerotic disease with multifocal stenoses,
most notably as follows.
3. Mild-to-moderate focal stenosis within the cavernous left
internal carotid artery.
4. Mild-to-moderate focal stenosis within the proximal A2 right
anterior cerebral artery.
5. Mild focal stenosis within the mid basilar artery.

CTA neck:

1. Extensive calcified plaque results in either segmental occlusion
or segmental high-grade stenosis of the proximal left subclavian
artery. Reconstitution of the left subclavian artery proximal to
left vertebral artery takeoff. Consider carotid artery/vertebral
artery duplex for further evaluation to assess for left subclavian
steal phenomenon.
2. Bilateral common and internal carotid arteries patent within the
neck. Calcified plaque within the proximal right ICA results in 40%
stenosis.
3. Plaque at the origins of the bilateral vertebral arteries with at
least moderate right ostial stenosis and moderate left ostial
stenosis. Distal to this, the bilateral vertebral arteries are
patent within the neck without significant stenosis (50% or
greater).

## 2020-09-25 IMAGING — CT CT ANGIO HEAD
1 of 11 series · 5 of 33 positions shown · IV contrast (APPLIED)
Comparison: Noncontrast head CT performed earlier the same day
03/06/2019., MRA neck 04/29/2006.

CLINICAL DATA: Vertigo, persistent, central. Additional history
provided: Dizziness upon waking this morning, nausea. Per provider,
patient not a stroke code.

EXAM:
CT ANGIOGRAPHY HEAD AND NECK
TECHNIQUE: Multidetector CT imaging of the head and neck was performed using
the standard protocol during bolus administration of intravenous
contrast. Multiplanar CT image reconstructions and MIPs were
obtained to evaluate the vascular anatomy. Carotid stenosis
measurements (when applicable) are obtained utilizing NASCET
criteria, using the distal internal carotid diameter as the
denominator.
CONTRAST:  60mL OMNIPAQUE IOHEXOL 350 MG/ML SOLN

[Series 12: axial thin · axial · 0.41mm/px · z∈[-325,-96]mm · 5 of 345 slices shown]
[im 58/345  soft-tissue]
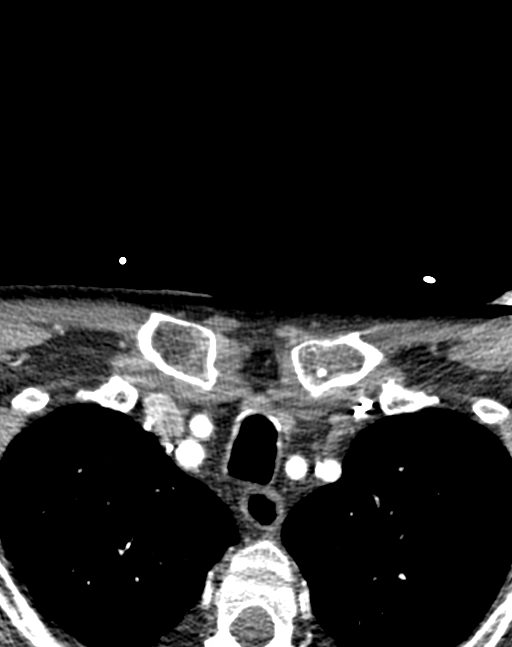
[im 115/345  bone]
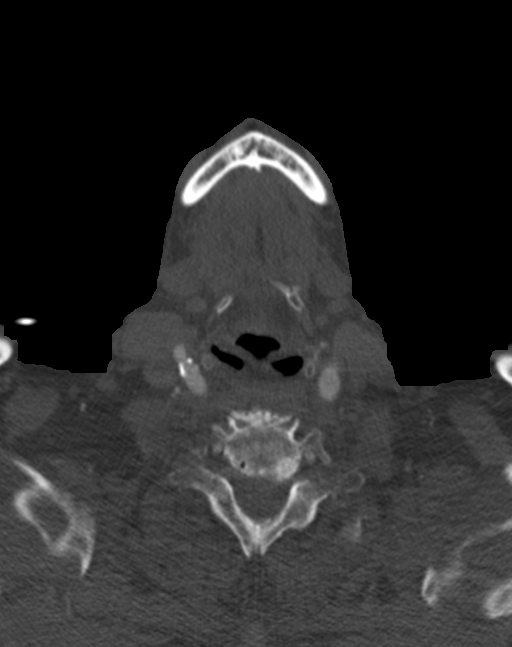
[im 173/345  soft-tissue]
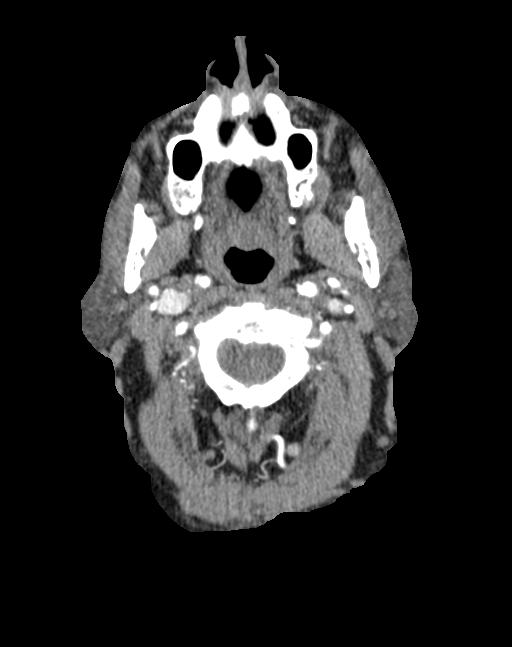
[im 230/345  bone]
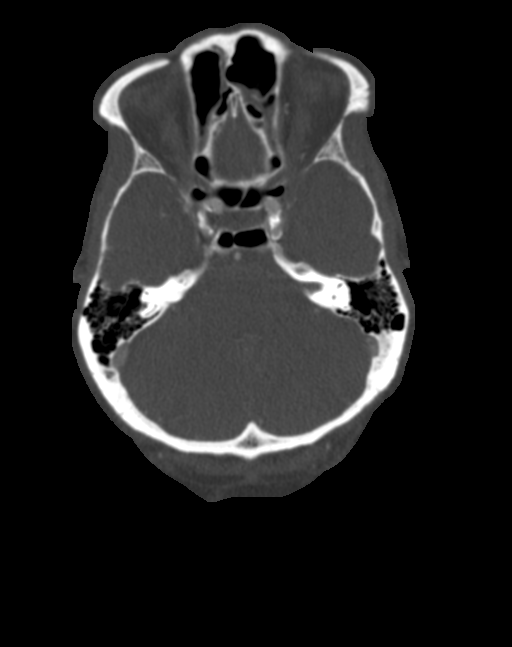
[im 287/345  soft-tissue]
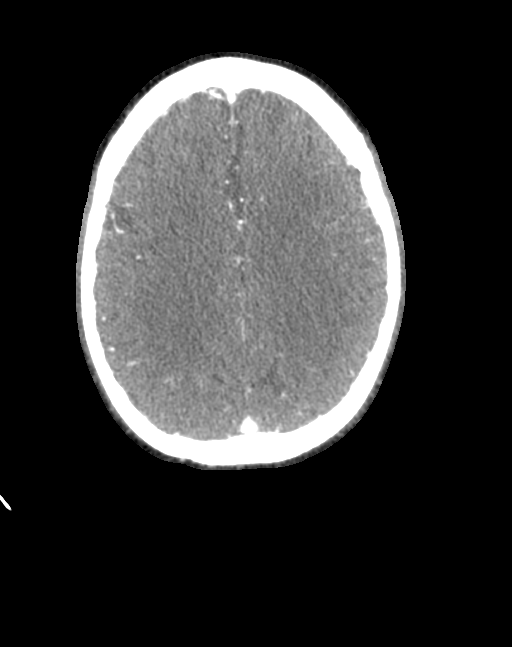

[5 of 33 positions shown; findings below may reference images not displayed]

FINDINGS: CT HEAD FINDINGS

Brain:

There is no evidence of acute intracranial hemorrhage. Calcification
within the bilateral basal ganglia.

Again demonstrated is extensive patchy and confluent hypodensity
within the cerebral white matter and to a lesser degree within the
cerebellar white matter, nonspecific but consistent with chronic
small vessel ischemic disease. No demarcated cortical infarction is
identified.

No evidence of intracranial mass. No midline shift or extra-axial
fluid collection. Mild generalized parenchymal atrophy.

Vascular: No hyperdense vessel. Atherosclerotic calcification of the
carotid artery siphons and vertebrobasilar system.

Skull: No calvarial fracture.

Sinuses: Minimal scattered paranasal sinus mucosal thickening. No
significant mastoid effusion

Orbits: Visualized orbits demonstrate no acute abnormality.

Review of the MIP images confirms the above findings

These results were called by telephone at the time of interpretation
on 03/06/2019 at [DATE] to provider [REDACTED], who verbally
acknowledged these results.

CTA NECK FINDINGS

Aortic arch: Included portions of the aortic arch demonstrate no
evidence of dissection or aneurysm. Prominent soft and calcified
plaque within the visualized aortic arch and proximal major branch
vessels of the neck. Prominent calcified plaque within the proximal
left subclavian artery with either severe stenosis or segmental
occlusion of the proximal left subclavian artery. Reconstitution
proximal to left vertebral artery takeoff.

Right carotid system: Common carotid artery patent without
significant stenosis. Prominent calcified plaque at the carotid
bifurcation and extending into the proximal ICA. Resultant
approximate 40% stenosis of the proximal ICA. Distal to this, the
ICA is patent within the neck without significant stenosis.

Left carotid system: CCA and ICA patent within the neck without
significant stenosis (50% or greater). Scattered soft and calcified
plaque.

Vertebral arteries: Calcified plaque at the origin of the right
vertebral artery with suspected at least moderate ostial stenosis.
Calcified plaque at the origin of the left vertebral artery with
moderate ostial stenosis. More distally, the bilateral vertebral
arteries are otherwise patent within the neck without significant
stenosis (50% or greater).

Skeleton: Cervical spondylosis with multilevel moderate/severe disc
height loss, small posterior disc osteophytes, uncovertebral and
facet hypertrophy. C4-C5 grade 1 anterolisthesis. No acute bony
abnormality.

Other neck: No soft tissue neck mass or pathologically enlarged
cervical chain lymph nodes. Thyroid unremarkable.

Upper chest: Moderate centrilobular emphysema within the imaged lung
apices. Partially visualized pacer leads.

Review of the MIP images confirms the above findings

CTA HEAD FINDINGS

Anterior circulation:

Calcified atherosclerotic disease within the bilateral intracranial
internal carotid arteries. Regions of mild stenosis within the
intracranial right internal carotid artery. Mild-to-moderate
stenosis within the cavernous left ICA.

The right middle cerebral artery is patent without significant
stenosis.

Hypoplastic the A1 right anterior cerebral artery. The A1 left
anterior cerebral artery predominantly supplies the bilateral A2
anterior cerebral arteries. Mild-to-moderate focal stenosis within
the proximal A2 right anterior cerebral artery. The left anterior
cerebral artery is patent without significant proximal stenosis.

The left middle cerebral artery is patent without significant
proximal stenosis.

Posterior circulation:

The intracranial vertebral arteries are patent without significant
stenosis. Flow is seen within the right PICA. The left PICA is
poorly delineated.

The basilar artery is patent. Mild focal stenosis within the mid
basilar artery. Flow is seen within the proximal left AICA. The
right AICA is poorly delineated. Flow is seen within the proximal
superior cerebellar arteries bilaterally.

Predominantly fetal origin of the right posterior cerebral artery.
The right posterior cerebral artery is patent without significant
proximal stenosis. The left posterior cerebral artery is patent
without significant proximal stenosis.

No intracranial aneurysm is identified

Venous sinuses: Within limitations of contrast timing, no convincing
thrombus.

Anatomic variants: As described.

Review of the MIP images confirms the above findings
IMPRESSION: CT head:

1. No evidence of acute intracranial hemorrhage or acute demarcated
cortical infarction.

2. Generalized parenchymal atrophy with extensive chronic small
vessel ischemic disease.

CTA head:

1. The left PICA and right AICA are poorly delineated. This may be
due to small vessel size. Stenoses within these vessels cannot be
excluded.
2. Intracranial atherosclerotic disease with multifocal stenoses,
most notably as follows.
3. Mild-to-moderate focal stenosis within the cavernous left
internal carotid artery.
4. Mild-to-moderate focal stenosis within the proximal A2 right
anterior cerebral artery.
5. Mild focal stenosis within the mid basilar artery.

CTA neck:

1. Extensive calcified plaque results in either segmental occlusion
or segmental high-grade stenosis of the proximal left subclavian
artery. Reconstitution of the left subclavian artery proximal to
left vertebral artery takeoff. Consider carotid artery/vertebral
artery duplex for further evaluation to assess for left subclavian
steal phenomenon.
2. Bilateral common and internal carotid arteries patent within the
neck. Calcified plaque within the proximal right ICA results in 40%
stenosis.
3. Plaque at the origins of the bilateral vertebral arteries with at
least moderate right ostial stenosis and moderate left ostial
stenosis. Distal to this, the bilateral vertebral arteries are
patent within the neck without significant stenosis (50% or
greater).

## 2020-10-03 ENCOUNTER — Emergency Department (HOSPITAL_BASED_OUTPATIENT_CLINIC_OR_DEPARTMENT_OTHER): Payer: Medicare Other

## 2020-10-03 ENCOUNTER — Other Ambulatory Visit: Payer: Self-pay

## 2020-10-03 ENCOUNTER — Emergency Department (HOSPITAL_BASED_OUTPATIENT_CLINIC_OR_DEPARTMENT_OTHER)
Admission: EM | Admit: 2020-10-03 | Discharge: 2020-10-03 | Disposition: A | Discharge status: Home / Self Care | Payer: Medicare Other | Attending: Emergency Medicine | Admitting: Emergency Medicine

## 2020-10-03 DIAGNOSIS — I498 Other specified cardiac arrhythmias: Secondary | ICD-10-CM | POA: Insufficient documentation

## 2020-10-03 DIAGNOSIS — I700 Atherosclerosis of aorta: Secondary | ICD-10-CM | POA: Diagnosis not present

## 2020-10-03 DIAGNOSIS — I509 Heart failure, unspecified: Secondary | ICD-10-CM | POA: Insufficient documentation

## 2020-10-03 DIAGNOSIS — J449 Chronic obstructive pulmonary disease, unspecified: Secondary | ICD-10-CM | POA: Diagnosis not present

## 2020-10-03 DIAGNOSIS — N1330 Unspecified hydronephrosis: Secondary | ICD-10-CM | POA: Diagnosis not present

## 2020-10-03 DIAGNOSIS — N183 Chronic kidney disease, stage 3 unspecified: Secondary | ICD-10-CM | POA: Diagnosis not present

## 2020-10-03 DIAGNOSIS — K5730 Diverticulosis of large intestine without perforation or abscess without bleeding: Secondary | ICD-10-CM | POA: Diagnosis not present

## 2020-10-03 DIAGNOSIS — Z87891 Personal history of nicotine dependence: Secondary | ICD-10-CM | POA: Diagnosis not present

## 2020-10-03 DIAGNOSIS — E119 Type 2 diabetes mellitus without complications: Secondary | ICD-10-CM | POA: Diagnosis not present

## 2020-10-03 DIAGNOSIS — Z79899 Other long term (current) drug therapy: Secondary | ICD-10-CM | POA: Insufficient documentation

## 2020-10-03 DIAGNOSIS — N4889 Other specified disorders of penis: Secondary | ICD-10-CM | POA: Diagnosis not present

## 2020-10-03 DIAGNOSIS — I130 Hypertensive heart and chronic kidney disease with heart failure and stage 1 through stage 4 chronic kidney disease, or unspecified chronic kidney disease: Secondary | ICD-10-CM | POA: Diagnosis not present

## 2020-10-03 DIAGNOSIS — Z7982 Long term (current) use of aspirin: Secondary | ICD-10-CM | POA: Insufficient documentation

## 2020-10-03 DIAGNOSIS — R109 Unspecified abdominal pain: Secondary | ICD-10-CM

## 2020-10-03 DIAGNOSIS — R1032 Left lower quadrant pain: Secondary | ICD-10-CM | POA: Diagnosis not present

## 2020-10-03 LAB — CBC WITH DIFFERENTIAL/PLATELET
Abs Immature Granulocytes: 0.07 10*3/uL (ref 0.00–0.07)
Basophils Absolute: 0 10*3/uL (ref 0.0–0.1)
Basophils Relative: 0 %
Eosinophils Absolute: 0 10*3/uL (ref 0.0–0.5)
Eosinophils Relative: 0 %
HCT: 47.5 % (ref 39.0–52.0)
Hemoglobin: 16.1 g/dL (ref 13.0–17.0)
Immature Granulocytes: 1 %
Lymphocytes Relative: 7 %
Lymphs Abs: 0.8 10*3/uL (ref 0.7–4.0)
MCH: 29.8 pg (ref 26.0–34.0)
MCHC: 33.9 g/dL (ref 30.0–36.0)
MCV: 87.8 fL (ref 80.0–100.0)
Monocytes Absolute: 1 10*3/uL (ref 0.1–1.0)
Monocytes Relative: 8 %
Neutro Abs: 9.9 10*3/uL — ABNORMAL HIGH (ref 1.7–7.7)
Neutrophils Relative %: 84 %
Platelets: 161 10*3/uL (ref 150–400)
RBC: 5.41 MIL/uL (ref 4.22–5.81)
RDW: 14.6 % (ref 11.5–15.5)
WBC: 11.8 10*3/uL — ABNORMAL HIGH (ref 4.0–10.5)
nRBC: 0 % (ref 0.0–0.2)

## 2020-10-03 LAB — URINALYSIS, ROUTINE W REFLEX MICROSCOPIC
Bilirubin Urine: NEGATIVE
Glucose, UA: NEGATIVE mg/dL
Hgb urine dipstick: NEGATIVE
Ketones, ur: NEGATIVE mg/dL
Leukocytes,Ua: NEGATIVE
Nitrite: NEGATIVE
Protein, ur: NEGATIVE mg/dL
Specific Gravity, Urine: 1.02 (ref 1.005–1.030)
pH: 5 (ref 5.0–8.0)

## 2020-10-03 LAB — LIPASE, BLOOD: Lipase: 35 U/L (ref 11–51)

## 2020-10-03 LAB — COMPREHENSIVE METABOLIC PANEL
ALT: 16 U/L (ref 0–44)
AST: 22 U/L (ref 15–41)
Albumin: 4.3 g/dL (ref 3.5–5.0)
Alkaline Phosphatase: 63 U/L (ref 38–126)
Anion gap: 11 (ref 5–15)
BUN: 38 mg/dL — ABNORMAL HIGH (ref 8–23)
CO2: 24 mmol/L (ref 22–32)
Calcium: 8.7 mg/dL — ABNORMAL LOW (ref 8.9–10.3)
Chloride: 99 mmol/L (ref 98–111)
Creatinine, Ser: 2.09 mg/dL — ABNORMAL HIGH (ref 0.61–1.24)
GFR, Estimated: 30 mL/min — ABNORMAL LOW (ref >60)
Glucose, Bld: 147 mg/dL — ABNORMAL HIGH (ref 70–99)
Potassium: 4.8 mmol/L (ref 3.5–5.1)
Sodium: 134 mmol/L — ABNORMAL LOW (ref 135–145)
Total Bilirubin: 0.9 mg/dL (ref 0.3–1.2)
Total Protein: 7.8 g/dL (ref 6.5–8.1)

## 2020-10-03 LAB — LACTIC ACID, PLASMA: Lactic Acid, Venous: 1.7 mmol/L (ref 0.5–1.9)

## 2020-10-03 MED ORDER — ONDANSETRON 4 MG PO TBDP
ORAL_TABLET | ORAL | Status: AC
  Filled 2020-10-03: qty 1

## 2020-10-03 MED ORDER — HYDROCODONE-ACETAMINOPHEN 5-325 MG PO TABS: 1.0000 | ORAL_TABLET | Freq: Four times a day (QID) | ORAL | 0 refills | Status: AC | PRN

## 2020-10-03 MED ORDER — ONDANSETRON 4 MG PO TBDP
4.0000 mg | ORAL_TABLET | Freq: Once | ORAL | Status: AC
  Administered 2020-10-03: 4 mg via ORAL

## 2020-10-03 MED ORDER — MORPHINE SULFATE (PF) 4 MG/ML IV SOLN
4.0000 mg | Freq: Once | INTRAVENOUS | Status: AC
Start: 2020-10-03 — End: 2020-10-03
  Administered 2020-10-03: 4 mg via INTRAVENOUS
  Filled 2020-10-03: qty 1

## 2020-10-03 MED ORDER — HYDROCODONE-ACETAMINOPHEN 5-325 MG PO TABS
1.0000 | ORAL_TABLET | Freq: Once | ORAL | Status: DC
  Filled 2020-10-03: qty 1

## 2020-10-03 MED ORDER — HYDROCODONE-ACETAMINOPHEN 5-325 MG PO TABS
1.0000 | ORAL_TABLET | Freq: Once | ORAL | Status: AC
  Administered 2020-10-03: 1 via ORAL
  Filled 2020-10-03: qty 1

## 2020-10-03 NOTE — ED Notes (Addendum)
Pt verbalized understanding to pick up prescriptions at pharmacy listed on d/c instructions.  °

## 2020-10-03 NOTE — ED Provider Notes (Signed)
[facility] EMERGENCY DEPARTMENT Provider Note   CSN: [id number] Arrival date & time: 10/03/20  1631     History Chief Complaint  Patient presents with  . Abdominal Pain    Daniel Woodard is a 87 y.o. male, with a history of T2DM (chronically diet controlled, last a1c 6.1 in 05/2020) diverticulosis, BPH, hypertension, PVD, and complete heart block/A. fib s/p pacemaker placement, presenting for evaluation of abdominal pain.  He reports he was otherwise in his normal state of health until acute onset of periumbilical/left lower quadrant abdominal pain since about 12:00 this afternoon.  Pain has been constant, throbbing/stabbing, and does not radiate.  Feels it has gotten worse since onset.  Rates 10/10, unable to say anything specifically makes it worse.  He ate biscuits with cheese for a late breakfast, otherwise has not eaten anything else and does not want to eat currently.  Has drank some water and tolerated.  No associated nausea, vomiting, diarrhea, fever, dysuria, lightheadedness/dizziness, shortness of breath, cough.  Last normal BM several days ago, however said he had a small amount of brown stool earlier today.  Urinating as normal.  Denies any melena/hematochezia.  He has a history of an ulcer surgery approximately 40 years ago and an appendectomy.  Last colonoscopy in 2013, polyps and diverticulosis.  Took some Ex-Lax at home, otherwise has not done anything to make his pain better.     Past Medical History:  Diagnosis Date  . ALLERGIC RHINITIS 04/09/2007  . Atrial fibrillation ([place])   . BACTEREMIA, 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  . BENIGN PROSTATIC HYPERTROPHY 04/09/2007  . Blood transfusion without reported diagnosis    really not sure thinks 30-40 years ago  . BRADYCARDIA 05/11/2009  . Carotid artery occlusion   . COLONIC POLYPS, HX OF 04/09/2007   ADENOMATOUS POLYPS 7209,4709  . Complete heart block ([place])   . COPD 04/09/2007  . Cough 03/27/2008  .  DEPRESSION 04/09/2007  . DIABETES MELLITUS, TYPE II 10/09/2007  . DIVERTICULOSIS, COLON 04/09/2007  . DIZZINESS 10/06/2008  . GLUCOSE INTOLERANCE 04/09/2007  . HYPERLIPIDEMIA 03/27/2007  . HYPERTENSION 03/27/2007  . PERIPHERAL VASCULAR DISEASE 03/27/2007  . PNEUMONIA ORGANISM NOS 11/08/2007  . SHINGLES, HX OF 04/09/2007  . SHOULDER PAIN, BILATERAL 08/01/2007  . Ulcer    40 years ago    Patient Active Problem List   Diagnosis Date Noted  . Aortic atherosclerosis ([place]) 09/10/2020  . Complete heart block ([place]) 06/18/2020  . Abnormal TSH 06/11/2020  . Second degree atrioventricular block   . CHF (congestive heart failure) ([place]) 06/01/2020  . CKD (chronic kidney disease), stage III ([place]) 06/01/2020  . DNR (do not resuscitate) 06/01/2020  . Abnormal findings on diagnostic imaging of lung 04/03/2020  . Healthcare maintenance 04/03/2020  . UIP  probable dx by hrct  07/07/2019  . DOE (dyspnea on exertion) 07/05/2019  . Skin lesion of scalp 05/18/2017  . COPD exacerbation ([place]) 11/20/2015  . Idiopathic choroidal neovascularization of both eyes 07/17/2015  . Pseudophakia of both eyes 07/17/2015  . Vitelliform lesion of macula 07/17/2015  . Vitreomacular adhesion of right eye 07/17/2015  . Insomnia 05/22/2015  . Cortical age-related cataract of left eye 05/19/2015  . Nuclear sclerotic cataract of left eye 05/19/2015  . Cataract, mature 04/28/2015  . Olecranon bursitis of right elbow 02/03/2015  . Pacemaker 10/10/2013  . Carotid artery occlusion 09/19/2013  . Aftercare following surgery of the circulatory system, [place] 09/19/2013  . Second degree AV block 07/09/2013  . Encounter for  well adult exam with abnormal findings 11/17/2010  . BRADYCARDIA 05/11/2009  . Dizziness 10/06/2008  . Cough 03/27/2008  . PNEUMONIA ORGANISM NOS 11/08/2007  . Diabetes ([place]) 10/09/2007  . SHOULDER PAIN, BILATERAL 08/01/2007  . Shoulder joint pain 08/01/2007  . BACTEREMIA, MYCOBACTERIUM AVIUM COMPLEX  04/09/2007  . Depression 04/09/2007  . Allergic rhinitis 04/09/2007  . COPD GOLD II / AB component  04/09/2007  . Diverticulosis of colon 04/09/2007  . Benign prostatic hyperplasia 04/09/2007  . History of colonic polyps 04/09/2007  . SHINGLES, HX OF 04/09/2007  . Hyperlipidemia 03/27/2007  . Essential hypertension 03/27/2007  . Peripheral vascular disease ([place]) 03/27/2007    Past Surgical History:  Procedure Laterality Date  . APPENDECTOMY     87 years old  . BIV UPGRADE N/A 08/14/2020   Procedure: BIV PPM UPGRADE;  Surgeon: Evans Lance, MD;  Location: [facility];  Service: Cardiovascular;  Laterality: N/A;  . CAROTID ENDARTERECTOMY  5/08   left  . CARPAL TUNNEL RELEASE     bilateral  . COLONOSCOPY    . ESOPHAGOGASTRODUODENOSCOPY  06/07/2004  . HEMORRHOID BANDING  1990s  . LEFT HEART CATH AND CORONARY ANGIOGRAPHY N/A 06/02/2020   Procedure: LEFT HEART CATH AND CORONARY ANGIOGRAPHY;  Surgeon: Jettie Booze, MD;  Location: [facility];  Service: Cardiovascular;  Laterality: N/A;  . PACEMAKER INSERTION  07/11/2013   MDT ADDRL1 pacemaker implanted by Dr Lovena Le for complete heart block  . PERMANENT PACEMAKER INSERTION N/A 07/11/2013   Procedure: PERMANENT PACEMAKER INSERTION;  Surgeon: Evans Lance, MD;  Location: [facility] CATH LAB;  Service: Cardiovascular;  Laterality: N/A;  . s/p PUD surgury     ? partial gastrectomy       Family History  Problem Relation Age of Onset  . Hypertension Father   . Ulcers Father   . Hypertension Mother   . Hypertension Sister   . Peripheral vascular disease Brother   . Hypertension Sister   . Ulcers Sister        Ulcers  . Hypertension Sister   . Lung cancer Sister        smoked  . Heart disease Sister        Carotid   . Hypertension Sister   . Heart disease Other        Cardiovascular disorder, CHF  . Stroke Other        1st degree relative male and male  . Colon cancer Neg Hx     Social History   Tobacco Use   . Smoking status: Former Smoker    Packs/day: 1.00    Years: 50.00    Pack years: 50.00    Types: Cigarettes    Quit date: 08/20/1999    Years since quitting: 21.1  . Smokeless tobacco: Never Used  Vaping Use  . Vaping Use: Never used  Substance Use Topics  . Alcohol use: No    Alcohol/week: 0.0 standard drinks  . Drug use: No    Home Medications Prior to Admission medications   Medication Sig Start Date End Date Taking? Authorizing Provider  aspirin 325 MG tablet Take 1 tablet (325 mg total) by mouth daily. 03/08/19  Yes Geradine Girt, DO  Coenzyme Q10 (COQ-10) 200 MG CAPS Take 200 mg by mouth daily after lunch.   Yes [provider]  furosemide (LASIX) 40 MG tablet Take 1 tablet by mouth once daily 08/20/20  Yes Biagio Borg, MD  HYDROcodone-acetaminophen (NORCO/VICODIN) 5-325 MG  tablet Take 1 tablet by mouth every 6 (six) hours as needed for up to 3 days for moderate pain. 10/03/20 10/06/20 Yes Darrelyn Hillock N, DO  metoprolol succinate (TOPROL-XL) 25 MG 24 hr tablet Take 1/2 (one-half) tablet by mouth once daily 08/19/20  Yes Biagio Borg, MD  omeprazole (PRILOSEC) 20 MG capsule TAKE 1 CAPSULE BY MOUTH ONCE DAILY Patient taking differently: Take 20 mg by mouth daily. TAKE 1 CAPSULE BY MOUTH ONCE DAILY 11/20/19  Yes Biagio Borg, MD  potassium chloride SA (KLOR-CON) 20 MEQ tablet Take 1 tablet (20 mEq total) by mouth daily. 08/11/20  Yes Evans Lance, MD  rosuvastatin (CRESTOR) 20 MG tablet Take 1 tablet (20 mg total) by mouth every other day. 07/30/20  Yes Biagio Borg, MD  VITAMIN D PO Take 1,000 Units by mouth daily.   Yes [provider]  albuterol (VENTOLIN HFA) 108 (90 Base) MCG/ACT inhaler Inhale 2 puffs into the lungs every 6 (six) hours as needed for wheezing or shortness of breath. 04/03/20   Lauraine Rinne, NP  Psyllium (METAMUCIL PO) Take 2 capsules by mouth daily as needed (constipation).    [provider]    Allergies    Ace  inhibitors  Review of Systems   Review of Systems  Constitutional: Positive for appetite change. Negative for fatigue and fever.  Respiratory: Negative for chest tightness and shortness of breath.   Gastrointestinal: Positive for abdominal pain. Negative for abdominal distention, anal bleeding, diarrhea, nausea and vomiting.  Genitourinary: Negative for difficulty urinating, dysuria, flank pain, penile pain and testicular pain.  Skin: Negative for rash.  Neurological: Negative for dizziness and light-headedness.    Physical Exam Updated Vital Signs BP 102/70 (BP Location: Left Arm)   Pulse 63   Temp 98.3 F (36.8 C) (Oral)   Resp 18   Ht _0  (1.854 m)   Wt 78 kg   SpO2 94%   BMI 22.69 kg/m   Physical Exam Constitutional:      General: He is not in acute distress.    Appearance: He is well-developed. He is not ill-appearing.  HENT:     Head: Normocephalic and atraumatic.     Mouth/Throat:     Mouth: Mucous membranes are moist.  Pulmonary:     Effort: Pulmonary effort is normal.  Abdominal:     Tenderness: There is no right CVA tenderness or left CVA tenderness.     Hernia: There is no hernia in the umbilical area or ventral area.     Comments: Soft, nondistended abdomen.  Normoactive bowel sounds throughout.  TTP to periumbilical region predominantly and left lower quadrant without rebounding or guarding.  Vertical RLQ scar noted.  No hepatosplenomegaly or masses palpated.  Skin:    General: Skin is warm.     Capillary Refill: Capillary refill takes less than 2 seconds.     Findings: No rash.  Neurological:     General: No focal deficit present.     Mental Status: He is alert and oriented to person, place, and time.     ED Results / Procedures / Treatments   Labs (all labs ordered are listed, but only abnormal results are displayed) Labs Reviewed  COMPREHENSIVE METABOLIC PANEL - Abnormal; Notable for the following components:      Result Value   Sodium 134 (*)     Glucose, Bld 147 (*)    BUN 38 (*)    Creatinine, Ser 2.09 (*)  Calcium 8.7 (*)    GFR, Estimated 30 (*)    All other components within normal limits  CBC WITH DIFFERENTIAL/PLATELET - Abnormal; Notable for the following components:   WBC 11.8 (*)    Neutro Abs 9.9 (*)    All other components within normal limits  LACTIC ACID, PLASMA  LIPASE, BLOOD  URINALYSIS, ROUTINE W REFLEX MICROSCOPIC  LACTIC ACID, PLASMA    EKG None  Radiology CT Abdomen Pelvis Wo Contrast  Result Date: 10/03/2020 CLINICAL DATA:  Left lower quadrant pain for several hours, initial encounter EXAM: CT ABDOMEN AND PELVIS WITHOUT CONTRAST TECHNIQUE: Multidetector CT imaging of the abdomen and pelvis was performed following the standard protocol without IV contrast. COMPARISON:  04/14/2006 FINDINGS: Lower chest: Mild scarring/atelectasis in the bases bilaterally. Hepatobiliary: Gallbladder is within normal limits. Liver shows no focal mass lesion. Pancreas: Unremarkable. No pancreatic ductal dilatation or surrounding inflammatory changes. Spleen: Normal in size without focal abnormality. Adrenals/Urinary Tract: Adrenal glands are within normal limits. Right kidney shows no renal calculi. Hypodensity likely representing cyst is seen. No right-sided obstructive changes are noted. Left kidney demonstrates considerable perinephric stranding with evidence of mild hydronephrosis and hydroureter which extends to the level of the UVJ. No definitive stone is seen. These changes are likely related to edema from recently passed stone. Calcifications are noted in the midshaft of the penis which may represent a recently passed stone. Correlation is recommended. Bladder is decompressed. Stomach/Bowel: Scattered diverticular changes noted without evidence of diverticulitis. No obstructive or inflammatory changes of the colon are seen. The appendix is not visualized consistent with the prior surgical history. Stomach and small bowel are  unremarkable. Vascular/Lymphatic: Aortic atherosclerosis. No enlarged abdominal or pelvic lymph nodes. Reproductive: Prostate is unremarkable. Other: No abdominal wall hernia or abnormality. No abdominopelvic ascites. Musculoskeletal: No acute or significant osseous findings. IMPRESSION: Left-sided hydronephrosis and hydroureter without definitive ureteral stone. These changes likely represent edema from a recently passed stone. Calcifications are noted in the midshaft of the penis which may represent a residual stone. Correlate with the clinical history. Diverticulosis without diverticulitis. No other focal abnormality is noted. Electronically Signed   By: Inez Catalina M.D.   On: 10/03/2020 18:34    Procedures Procedures   Medications Ordered in ED Medications  HYDROcodone-acetaminophen (NORCO/VICODIN) 5-325 MG per tablet 1 tablet (1 tablet Oral Not Given 10/03/20 1937)  morphine 4 MG/ML injection 4 mg (4 mg Intravenous Given 10/03/20 1745)  ondansetron (ZOFRAN-ODT) disintegrating tablet 4 mg (4 mg Oral Given 10/03/20 1908)  HYDROcodone-acetaminophen (NORCO/VICODIN) 5-325 MG per tablet 1 tablet (1 tablet Oral Given 10/03/20 1951)    ED Course  I have reviewed the triage vital signs and the nursing notes.  Pertinent labs & imaging results that were available during my care of the patient were reviewed by me and considered in my medical decision making (see chart for details).    MDM Rules/Calculators/A&P                           87 year old gentleman, with a history of appendectomy, diverticulosis, PVD, diet-controlled T2DM, and CKD stage IIIb presenting for evaluation of acute periumbilical/LLQ abdominal pain.  Afebrile and hemodynamically stable, TTP to regions above without rebounding or guarding.  Initial differential including diverticulitis with known history of diverticulosis, partial SBO with previous abdominal surgeries, constipation, renal stone, vascular abnormality, gastroenteritis.  Less  concern for DKA given chronically diet-controlled T2DM, however will assess on labs.  No urinary  symptoms or CVA tenderness to suggest complicated UTI/Pyelo.  Will obtain CMP, lipase, CBC, U/a in addition to CT abdomen/pelvis without contrast (CKD history).   Labs and imaging reviewed.  Mild AKI (2.09 from 1.8 in 07/2020) and leukocytosis with neutrophilic shift to 11.  No hepatic abnormality or lipase elevation.  Lactic acid normal, suggesting against mesenteric ischemia.  CT showing left-sided hydronephrosis, perinephric stranding, and hydroureter without a definitive ureteral stone, feel likely represents edema from possibly passed stone.  Otherwise no diverticulitis or focal abnormality.  Given location, could correlate to his current abdominal pain.  U/A negative without hematuria or signs of infection.   At discharge, abdominal pain improved with Norco and morphine, remained hemodynamically stable.  Recommended adequate hydration and follow-up with urology, given contact information.  Short supply of Norco sent for pain relief.  Strict ED precautions discussed with patient and daughter.  Final Clinical Impression(s) / ED Diagnoses Final diagnoses:  Hydronephrosis, unspecified hydronephrosis type  Abdominal pain, unspecified abdominal location    Rx / DC Orders ED Discharge Orders         Ordered    HYDROcodone-acetaminophen (NORCO/VICODIN) 5-325 MG tablet  Every 6 hours PRN        10/03/20 2101           Patriciaann Clan, DO 10/03/20 2217    Quintella Reichert, MD 10/05/20 1116

## 2020-10-03 NOTE — ED Triage Notes (Signed)
Pt presents to ED POV. Pt c/o mid abd pain that began at 1200 today. Pt reports that its a constant stabbing pain. Pt reports that he has not had a normal bowel movement in 2-3 days, less than normal.

## 2020-10-03 NOTE — Discharge Instructions (Addendum)
It was wonderful to see him today.  We believe his belly pain is from a past kidney stone and still has some swelling of his left kidney.  Please make sure that he is drinking small sips of water throughout the day to stay hydrated and allow things to flush through.  He will need to follow-up with urology.  Please return to the ED if his abdominal pain becomes more severe, recurrent nausea/vomiting, decreased urination or any worsening.  We sent in a very short supply of Norco to help with his pain. This is in the opioid pain medication, please be very cautious with use.  This can make you drowsy.

## 2020-10-03 NOTE — ED Notes (Signed)
Pt asleep when RN walked in. Pt awoke with greeting. When asked pt's pain score, pain rated 8/10. When pt was given Norco in paper medicine cup, pt dropped pill on ground. RN wasted Norco with another RN as witness and wasted it in pyxis as well. When RN attempted to grab another Norco, it was unable to be pulled. Pharmacy was called and stated with original pull of Norco nulled one time dose and that provider needed to put in another one time dose. MD & charge nurse aware.

## 2020-10-05 ENCOUNTER — Other Ambulatory Visit: Payer: Self-pay

## 2020-10-05 ENCOUNTER — Ambulatory Visit: Payer: Medicare Other | Admitting: Internal Medicine

## 2020-10-05 ENCOUNTER — Encounter: Payer: Self-pay | Admitting: Internal Medicine

## 2020-10-05 VITALS — BP 156/70 | HR 79 | Temp 97.7°F | Ht 73.0 in | Wt 173.0 lb

## 2020-10-05 DIAGNOSIS — K5900 Constipation, unspecified: Secondary | ICD-10-CM

## 2020-10-05 DIAGNOSIS — I10 Essential (primary) hypertension: Secondary | ICD-10-CM | POA: Diagnosis not present

## 2020-10-05 DIAGNOSIS — N200 Calculus of kidney: Secondary | ICD-10-CM | POA: Diagnosis not present

## 2020-10-05 MED ORDER — DOCUSATE SODIUM 100 MG PO CAPS: 100.0000 mg | ORAL_CAPSULE | Freq: Two times a day (BID) | ORAL | 11 refills | Status: DC

## 2020-10-05 MED ORDER — SENNA-DOCUSATE SODIUM 8.6-50 MG PO TABS: 1.0000 | ORAL_TABLET | Freq: Every day | ORAL | 3 refills | Status: DC

## 2020-10-05 MED ORDER — LINACLOTIDE 72 MCG PO CAPS: 72.0000 ug | ORAL_CAPSULE | Freq: Every day | ORAL | 5 refills | Status: DC

## 2020-10-05 NOTE — Patient Instructions (Signed)
Ok to try the OTC sennakot and colace as needed  Please take all new medication as prescribed - the linzess if not effective  Please continue all other medications as before, and refills have been done if requested.  Please have the pharmacy call with any other refills you may need.  Please keep your appointments with your specialists as you may have planned

## 2020-10-05 NOTE — Progress Notes (Signed)
Patient ID: [name], male   DOB: Oct 06, 1929, 87 y.o.   MRN: [id number]        Chief Complaint: constipation       HPI:  Daniel Woodard is a 87 y.o. male here with c/o kidney stone 2 days ago and has appt with urology to f/u in a few days;  No current stone pain - Denies urinary symptoms such as dysuria, frequency, urgency, flank pain, hematuria or n/v, fever, chills.  Does have larger issue of constipation now worse in the past 7 days despite good compliance with metamucil, leading to worsening nausea and reflux but no vomiting or increased abd girth.  Has been passing gas.  Has had hydrocodone prn for the kidney stone but has only taken one pill last pm in the past 2-3 days.  BP has been most often < 140/90 at home.  Pt denies chest pain, increased sob or doe, wheezing, orthopnea, PND, increased LE swelling, palpitations, dizziness or syncope.   Pt denies polydipsia, polyuria, or new focal neuro s/s.   Pt denies fever, wt loss, night sweats, loss of appetite, or other constitutional symptoms         Wt Readings from Last 3 Encounters:  10/05/20 173 lb (78.5 kg)  10/03/20 172 lb (78 kg)  09/10/20 178 lb (80.7 kg)   BP Readings from Last 3 Encounters:  10/05/20 (!) 156/70  10/03/20 102/70  09/10/20 128/82         Past Medical History:  Diagnosis Date  . ALLERGIC RHINITIS 04/09/2007  . Atrial fibrillation ([place])   . BACTEREMIA, 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  . BENIGN PROSTATIC HYPERTROPHY 04/09/2007  . Blood transfusion without reported diagnosis    really not sure thinks 30-40 years ago  . BRADYCARDIA 05/11/2009  . Carotid artery occlusion   . COLONIC POLYPS, HX OF 04/09/2007   ADENOMATOUS POLYPS 0932,3557  . Complete heart block ([place])   . COPD 04/09/2007  . Cough 03/27/2008  . DEPRESSION 04/09/2007  . DIABETES MELLITUS, TYPE II 10/09/2007  . DIVERTICULOSIS, COLON 04/09/2007  . DIZZINESS 10/06/2008  . GLUCOSE INTOLERANCE 04/09/2007  . HYPERLIPIDEMIA 03/27/2007  .  HYPERTENSION 03/27/2007  . PERIPHERAL VASCULAR DISEASE 03/27/2007  . PNEUMONIA ORGANISM NOS 11/08/2007  . SHINGLES, HX OF 04/09/2007  . SHOULDER PAIN, BILATERAL 08/01/2007  . Ulcer    40 years ago   Past Surgical History:  Procedure Laterality Date  . APPENDECTOMY     87 years old  . BIV UPGRADE N/A 08/14/2020   Procedure: BIV PPM UPGRADE;  Surgeon: Evans Lance, MD;  Location: [place] [facility];  Service: Cardiovascular;  Laterality: N/A;  . CAROTID ENDARTERECTOMY  5/08   left  . CARPAL TUNNEL RELEASE     bilateral  . COLONOSCOPY    . ESOPHAGOGASTRODUODENOSCOPY  06/07/2004  . HEMORRHOID BANDING  1990s  . LEFT HEART CATH AND CORONARY ANGIOGRAPHY N/A 06/02/2020   Procedure: LEFT HEART CATH AND CORONARY ANGIOGRAPHY;  Surgeon: Jettie Booze, MD;  Location: [place] [facility];  Service: Cardiovascular;  Laterality: N/A;  . PACEMAKER INSERTION  07/11/2013   MDT ADDRL1 pacemaker implanted by Dr Lovena Le for complete heart block  . PERMANENT PACEMAKER INSERTION N/A 07/11/2013   Procedure: PERMANENT PACEMAKER INSERTION;  Surgeon: Evans Lance, MD;  [facility];  Service: Cardiovascular;  Laterality: N/A;  . s/p PUD surgury     ? partial gastrectomy    reports that he quit smoking about 21 years ago.  His smoking use included cigarettes. He has a 50.00 pack-year smoking history. He has never used smokeless tobacco. He reports that he does not drink alcohol and does not use drugs. family history includes Heart disease in his sister and another family member; Hypertension in his father, mother, sister, sister, sister, and sister; Lung cancer in his sister; Peripheral vascular disease in his brother; Stroke in an other family member; Ulcers in his father and sister. Allergies  Allergen Reactions  . Ace Inhibitors Cough        Current Outpatient Medications on File Prior to Visit  Medication Sig Dispense Refill  . ASPIRIN 81 PO Take 1 tablet by mouth daily.    . Coenzyme Q10  (COQ-10) 200 MG CAPS Take 200 mg by mouth daily after lunch.    . furosemide (LASIX) 40 MG tablet Take 1 tablet by mouth once daily 90 tablet 1  . metoprolol succinate (TOPROL-XL) 25 MG 24 hr tablet Take 1/2 (one-half) tablet by mouth once daily 45 tablet 0  . omeprazole (PRILOSEC) 20 MG capsule TAKE 1 CAPSULE BY MOUTH ONCE DAILY (Patient taking differently: Take 20 mg by mouth daily. TAKE 1 CAPSULE BY MOUTH ONCE DAILY) 90 capsule 3  . potassium chloride SA (KLOR-CON) 20 MEQ tablet Take 1 tablet (20 mEq total) by mouth daily. 90 tablet 3  . Psyllium (METAMUCIL PO) Take 2 capsules by mouth daily as needed (constipation).    . rosuvastatin (CRESTOR) 20 MG tablet Take 1 tablet (20 mg total) by mouth every other day. 45 tablet 3  . VITAMIN D PO Take 1,000 Units by mouth daily.    Marland Kitchen albuterol (VENTOLIN HFA) 108 (90 Base) MCG/ACT inhaler Inhale 2 puffs into the lungs every 6 (six) hours as needed for wheezing or shortness of breath. (Patient not taking: Reported on 10/05/2020) 8 g 6   No current facility-administered medications on file prior to visit.        ROS:  All others reviewed and negative.  Objective        PE:  BP (!) 156/70 (BP Location: Right Arm, Patient Position: Sitting, Cuff Size: Large)   Pulse 79   Temp 97.7 F (36.5 C) (Oral)   Ht _0  (1.854 m)   Wt 173 lb (78.5 kg)   SpO2 96%   BMI 22.82 kg/m                 Constitutional: Pt appears in NAD               HENT: Head: NCAT.                Right Ear: External ear normal.                 Left Ear: External ear normal.                Eyes: . Pupils are equal, round, and reactive to light. Conjunctivae and EOM are normal               Nose: without d/c or deformity               Neck: Neck supple. Gross normal ROM               Cardiovascular: Normal rate and regular rhythm.                 Pulmonary/Chest: Effort normal and breath sounds without rales or wheezing.  Abd:  Soft, NT, ND, + BS, no organomegaly -  benign               Neurological: Pt is alert. At baseline orientation, motor grossly intact               Skin: Skin is warm. No rashes, no other new lesions, LE edema - none               Psychiatric: Pt behavior is normal without agitation   Micro: none  Cardiac tracings I have personally interpreted today:  none  Pertinent Radiological findings (summarize): none   Lab Results  Component Value Date   WBC 11.8 (H) 10/03/2020   HGB 16.1 10/03/2020   HCT 47.5 10/03/2020   PLT 161 10/03/2020   GLUCOSE 147 (H) 10/03/2020   CHOL 180 11/20/2019   TRIG 179.0 (H) 11/20/2019   HDL 35.80 (L) 11/20/2019   LDLDIRECT 189.9 10/02/2007   LDLCALC 108 (H) 11/20/2019   ALT 16 10/03/2020   AST 22 10/03/2020   NA 134 (L) 10/03/2020   K 4.8 10/03/2020   CL 99 10/03/2020   CREATININE 2.09 (H) 10/03/2020   BUN 38 (H) 10/03/2020   CO2 24 10/03/2020   TSH 4.35 07/09/2020   PSA 0.92 11/20/2019   INR 1.1 03/06/2019   HGBA1C 6.1 (H) 06/02/2020   MICROALBUR 6.3 (H) 11/20/2019   Assessment/Plan:  Daniel Woodard is a 87 y.o. White or Caucasian [1] male with  has a past medical history of ALLERGIC RHINITIS (04/09/2007), Atrial fibrillation ([place]), BACTEREMIA, MYCOBACTERIUM AVIUM COMPLEX (04/09/2007), BENIGN PROSTATIC HYPERTROPHY (04/09/2007), Blood transfusion without reported diagnosis, BRADYCARDIA (05/11/2009), Carotid artery occlusion, COLONIC POLYPS, HX OF (04/09/2007), Complete heart block ([place]), COPD (04/09/2007), Cough (03/27/2008), DEPRESSION (04/09/2007), DIABETES MELLITUS, TYPE II (10/09/2007), DIVERTICULOSIS, COLON (04/09/2007), DIZZINESS (10/06/2008), GLUCOSE INTOLERANCE (04/09/2007), HYPERLIPIDEMIA (03/27/2007), HYPERTENSION (03/27/2007), PERIPHERAL VASCULAR DISEASE (03/27/2007), PNEUMONIA ORGANISM NOS (11/08/2007), SHINGLES, HX OF (04/09/2007), SHOULDER PAIN, BILATERAL (08/01/2007), and Ulcer.  Constipation Mild to mod, no suggestion of obstruction , no fever, bleeding or vomiting; ok for colace  100 bid, senakot qd, and linzess low dose prn,  to f/u any worsening symptoms or concerns    Essential hypertension BP Readings from Last 3 Encounters:  10/05/20 (!) 156/70  10/03/20 102/70  09/10/20 128/82   Stable likley reactive today, pt to continue medical treatment - toprol   Kidney stone For f/u urology as planned, pt asked to minimize narcotic use if able,  to f/u any worsening symptoms or concerns  Followup: Return if symptoms worsen or fail to improve.  Cathlean Cower, MD 10/07/2020 7:56 PM [facility]

## 2020-10-07 ENCOUNTER — Encounter: Payer: Self-pay | Admitting: Internal Medicine

## 2020-10-07 DIAGNOSIS — R3121 Asymptomatic microscopic hematuria: Secondary | ICD-10-CM | POA: Diagnosis not present

## 2020-10-07 DIAGNOSIS — N200 Calculus of kidney: Secondary | ICD-10-CM | POA: Insufficient documentation

## 2020-10-07 DIAGNOSIS — N130 Hydronephrosis with ureteropelvic junction obstruction: Secondary | ICD-10-CM | POA: Diagnosis not present

## 2020-10-07 DIAGNOSIS — K5909 Other constipation: Secondary | ICD-10-CM | POA: Insufficient documentation

## 2020-10-07 DIAGNOSIS — K5900 Constipation, unspecified: Secondary | ICD-10-CM | POA: Insufficient documentation

## 2020-10-07 NOTE — Assessment & Plan Note (Signed)
For f/u urology as planned, pt asked to minimize narcotic use if able,  to f/u any worsening symptoms or concerns

## 2020-10-07 NOTE — Assessment & Plan Note (Signed)
Mild to mod, no suggestion of obstruction , no fever, bleeding or vomiting; ok for colace 100 bid, senakot qd, and linzess low dose prn,  to f/u any worsening symptoms or concerns

## 2020-10-07 NOTE — Assessment & Plan Note (Signed)
BP Readings from Last 3 Encounters:  10/05/20 (!) 156/70  10/03/20 102/70  09/10/20 128/82   Stable likley reactive today, pt to continue medical treatment - toprol

## 2020-11-03 ENCOUNTER — Encounter: Payer: Medicare Other | Admitting: Internal Medicine

## 2020-11-04 DIAGNOSIS — N281 Cyst of kidney, acquired: Secondary | ICD-10-CM | POA: Diagnosis not present

## 2020-11-04 DIAGNOSIS — N1330 Unspecified hydronephrosis: Secondary | ICD-10-CM | POA: Diagnosis not present

## 2020-11-04 DIAGNOSIS — K5730 Diverticulosis of large intestine without perforation or abscess without bleeding: Secondary | ICD-10-CM | POA: Diagnosis not present

## 2020-11-11 DIAGNOSIS — N130 Hydronephrosis with ureteropelvic junction obstruction: Secondary | ICD-10-CM | POA: Diagnosis not present

## 2020-11-11 DIAGNOSIS — R3121 Asymptomatic microscopic hematuria: Secondary | ICD-10-CM | POA: Diagnosis not present

## 2020-11-13 ENCOUNTER — Ambulatory Visit (INDEPENDENT_AMBULATORY_CARE_PROVIDER_SITE_OTHER): Payer: Medicare Other

## 2020-11-13 DIAGNOSIS — I442 Atrioventricular block, complete: Secondary | ICD-10-CM

## 2020-11-13 LAB — CUP PACEART REMOTE DEVICE CHECK
Battery Remaining Longevity: 131 mo
Battery Voltage: 3.19 V
Brady Statistic AP VP Percent: 30.84 %
Brady Statistic AP VS Percent: 0.01 %
Brady Statistic AS VP Percent: 69.12 %
Brady Statistic AS VS Percent: 0.04 %
Brady Statistic RA Percent Paced: 30.6 %
Brady Statistic RV Percent Paced: 99.95 %
Date Time Interrogation Session: 20220617062439
Implantable Lead Implant Date: 20150212
Implantable Lead Implant Date: 20150212
Implantable Lead Implant Date: 20220318
Implantable Lead Location: 753858
Implantable Lead Location: 753859
Implantable Lead Location: 753860
Implantable Lead Model: 4598
Implantable Lead Model: 5076
Implantable Lead Model: 5076
Implantable Pulse Generator Implant Date: 20220318
Lead Channel Impedance Value: 323 Ohm
Lead Channel Impedance Value: 361 Ohm
Lead Channel Impedance Value: 380 Ohm
Lead Channel Impedance Value: 418 Ohm
Lead Channel Impedance Value: 418 Ohm
Lead Channel Impedance Value: 437 Ohm
Lead Channel Impedance Value: 437 Ohm
Lead Channel Impedance Value: 570 Ohm
Lead Channel Impedance Value: 608 Ohm
Lead Channel Impedance Value: 741 Ohm
Lead Channel Impedance Value: 760 Ohm
Lead Channel Impedance Value: 931 Ohm
Lead Channel Impedance Value: 931 Ohm
Lead Channel Impedance Value: 950 Ohm
Lead Channel Pacing Threshold Amplitude: 0.625 V
Lead Channel Pacing Threshold Amplitude: 1 V
Lead Channel Pacing Threshold Amplitude: 1.125 V
Lead Channel Pacing Threshold Pulse Width: 0.4 ms
Lead Channel Pacing Threshold Pulse Width: 0.4 ms
Lead Channel Pacing Threshold Pulse Width: 0.5 ms
Lead Channel Sensing Intrinsic Amplitude: 17.25 mV
Lead Channel Sensing Intrinsic Amplitude: 17.25 mV
Lead Channel Sensing Intrinsic Amplitude: 4.5 mV
Lead Channel Sensing Intrinsic Amplitude: 4.5 mV
Lead Channel Setting Pacing Amplitude: 1.5 V
Lead Channel Setting Pacing Amplitude: 1.75 V
Lead Channel Setting Pacing Amplitude: 2 V
Lead Channel Setting Pacing Pulse Width: 0.4 ms
Lead Channel Setting Pacing Pulse Width: 0.5 ms
Lead Channel Setting Sensing Sensitivity: 0.9 mV

## 2020-11-18 ENCOUNTER — Other Ambulatory Visit: Payer: Self-pay

## 2020-11-18 ENCOUNTER — Ambulatory Visit: Payer: Medicare Other | Admitting: Internal Medicine

## 2020-11-18 ENCOUNTER — Encounter: Payer: Self-pay | Admitting: Internal Medicine

## 2020-11-18 ENCOUNTER — Other Ambulatory Visit: Payer: Self-pay | Admitting: Internal Medicine

## 2020-11-18 VITALS — BP 112/70 | HR 75 | Wt 172.6 lb

## 2020-11-18 DIAGNOSIS — I5042 Chronic combined systolic (congestive) and diastolic (congestive) heart failure: Secondary | ICD-10-CM

## 2020-11-18 DIAGNOSIS — I10 Essential (primary) hypertension: Secondary | ICD-10-CM | POA: Diagnosis not present

## 2020-11-18 DIAGNOSIS — Z950 Presence of cardiac pacemaker: Secondary | ICD-10-CM

## 2020-11-18 DIAGNOSIS — I442 Atrioventricular block, complete: Secondary | ICD-10-CM

## 2020-11-18 NOTE — Patient Instructions (Signed)
Medication Instructions:  Your physician recommends that you continue on your current medications as directed. Please refer to the Current Medication list given to you today.  Labwork: None ordered.  Testing/Procedures: None ordered.  Follow-Up: Your physician wants you to follow-up in: one year with Cristopher Peru, MD or one of the following Advanced Practice Providers on your designated Care Team:   Chanetta Marshall, NP Tommye Standard, PA-C Legrand Como "Jonni Sanger" [place], [place]  Remote monitoring is used to monitor your Pacemaker from home. This monitoring reduces the number of office visits required to check your device to one time per year. It allows [place] to keep an eye on the functioning of your device to ensure it is working properly. You are scheduled for a device check from home on 02/12/2021. You may send your transmission at any time that day. If you have a wireless device, the transmission will be sent automatically. After your physician reviews your transmission, you will receive a postcard with your next transmission date.  Any Other Special Instructions Will Be Listed Below (If Applicable).  If you need a refill on your cardiac medications before your next appointment, please call your pharmacy.

## 2020-11-18 NOTE — Progress Notes (Signed)
HPI Daniel Woodard returns today for followup. He is a pleasant 87 yo man with chb, s/p PPM insertion, HTN, carotid vascular disease, who has done well but notes occaisional episodes of dyspnea. The patient tries to avoid sodium excess. He denies syncope or peripheral edema or chest pain. He was in the hospital a few weeks ago with acute CHF and his echo demonstrated an EF of 30%. He has been treated with diuretic therapy. He feels better. He is fairly sedentary. He has undergone biv PPM upgrade. Allergies  Allergen Reactions   Ace Inhibitors Cough          Current Outpatient Medications  Medication Sig Dispense Refill   albuterol (VENTOLIN HFA) 108 (90 Base) MCG/ACT inhaler Inhale 2 puffs into the lungs every 6 (six) hours as needed for wheezing or shortness of breath. 8 g 6   ASPIRIN 81 PO Take 1 tablet by mouth daily.     Coenzyme Q10 (COQ-10) 200 MG CAPS Take 200 mg by mouth daily after lunch.     docusate sodium (COLACE) 100 MG capsule Take 1 capsule (100 mg total) by mouth 2 (two) times daily. 60 capsule 11   furosemide (LASIX) 40 MG tablet Take 1 tablet by mouth once daily 90 tablet 1   linaclotide (LINZESS) 72 MCG capsule Take 1 capsule (72 mcg total) by mouth daily before breakfast. 30 capsule 5   Magnesium 200 MG TABS Take 200 mg by mouth 2 (two) times daily.     metoprolol succinate (TOPROL-XL) 25 MG 24 hr tablet Take 1/2 (one-half) tablet by mouth once daily 45 tablet 3   omeprazole (PRILOSEC) 20 MG capsule Take 1 capsule by mouth once daily 90 capsule 3   potassium chloride SA (KLOR-CON) 20 MEQ tablet Take 1 tablet (20 mEq total) by mouth daily. 90 tablet 3   Psyllium (METAMUCIL PO) Take 2 capsules by mouth daily as needed (constipation).     rosuvastatin (CRESTOR) 20 MG tablet Take 1 tablet (20 mg total) by mouth every other day. 45 tablet 3   sennosides-docusate sodium (SENOKOT-S) 8.6-50 MG tablet Take 1 tablet by mouth at bedtime. 90 tablet 3   VITAMIN D PO Take 1,000  Units by mouth daily.     zinc gluconate 50 MG tablet Take 50 mg by mouth daily.     No current facility-administered medications for this visit.     Past Medical History:  Diagnosis Date   ALLERGIC RHINITIS 04/09/2007   Atrial fibrillation ([place])    BACTEREMIA, 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   BENIGN PROSTATIC HYPERTROPHY 04/09/2007   Blood transfusion without reported diagnosis    really not sure thinks 30-40 years ago   BRADYCARDIA 05/11/2009   Carotid artery occlusion    COLONIC POLYPS, HX OF 04/09/2007   ADENOMATOUS POLYPS 2004,2008   Complete heart block ([facility])    COPD 04/09/2007   Cough 03/27/2008   DEPRESSION 04/09/2007   DIABETES MELLITUS, TYPE II 10/09/2007   DIVERTICULOSIS, COLON 04/09/2007   DIZZINESS 10/06/2008   GLUCOSE INTOLERANCE 04/09/2007   HYPERLIPIDEMIA 03/27/2007   HYPERTENSION 03/27/2007   PERIPHERAL VASCULAR DISEASE 03/27/2007   PNEUMONIA ORGANISM NOS 11/08/2007   SHINGLES, HX OF 04/09/2007   SHOULDER PAIN, BILATERAL 08/01/2007   Ulcer    40 years ago    ROS:   All systems reviewed and negative except as noted in the HPI.   Past Surgical History:  Procedure Laterality Date   APPENDECTOMY     87 years  old   BIV UPGRADE N/A 08/14/2020   Procedure: BIV PPM UPGRADE;  Surgeon: Evans Lance, MD;  Location: [place] [facility];  Service: Cardiovascular;  Laterality: N/A;   CAROTID ENDARTERECTOMY  5/08   left   CARPAL TUNNEL RELEASE     bilateral   COLONOSCOPY     ESOPHAGOGASTRODUODENOSCOPY  06/07/2004   HEMORRHOID BANDING  1990s   LEFT HEART CATH AND CORONARY ANGIOGRAPHY N/A 06/02/2020   Procedure: LEFT HEART CATH AND CORONARY ANGIOGRAPHY;  Surgeon: Jettie Booze, MD;  Location: [place] [facility];  Service: Cardiovascular;  Laterality: N/A;   PACEMAKER INSERTION  07/11/2013   MDT ADDRL1 pacemaker implanted by Dr Lovena Le for complete heart block   PERMANENT PACEMAKER INSERTION N/A 07/11/2013   Procedure: PERMANENT PACEMAKER INSERTION;   Surgeon: Evans Lance, MD;  Location: [facility] CATH LAB;  Service: Cardiovascular;  Laterality: N/A;   s/p PUD surgury     ? partial gastrectomy     Family History  Problem Relation Age of Onset   Hypertension Father    Ulcers Father    Hypertension Mother    Hypertension Sister    Peripheral vascular disease Brother    Hypertension Sister    Ulcers Sister        Ulcers   Hypertension Sister    Lung cancer Sister        smoked   Heart disease Sister        Carotid    Hypertension Sister    Heart disease Other        Cardiovascular disorder, CHF   Stroke Other        1st degree relative male and male   Colon cancer Neg Hx      Social History   Socioeconomic History   Marital status: Widowed    Spouse name: Not on file   Number of children: 2   Years of education: Not on file   Highest education level: Not on file  Occupational History   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  Tobacco Use   Smoking status: Former    Packs/day: 1.00    Years: 50.00    Pack years: 50.00    Types: Cigarettes    Quit date: 08/20/1999    Years since quitting: 21.2   Smokeless tobacco: Never  Vaping Use   Vaping Use: Never used  Substance and Sexual Activity   Alcohol use: No    Alcohol/week: 0.0 standard drinks   Drug use: No   Sexual activity: Not on file  Other Topics Concern   Not on file  Social History Narrative   Widowed; wife dies 2018-09-05   Daily caffeine    Social Determinants of Health   Financial Resource Strain: Not on file  Food Insecurity: Not on file  Transportation Needs: Not on file  Physical Activity: Not on file  Stress: Not on file  Social Connections: Not on file  Intimate Partner Violence: Not on file     BP 112/70   Pulse 75   Wt 172 lb 9.6 oz (78.3 kg)   SpO2 95%   BMI 22.77 kg/m   Physical Exam:  Well appearing NAD HEENT: Unremarkable Neck:  No JVD, no thyromegally Lymphatics:  No adenopathy Back:   No CVA tenderness Lungs:  Clear with no wheezes HEART:  Regular rate rhythm, no murmurs, no rubs, no clicks Abd:  soft, positive bowel sounds, no organomegally, no rebound, no guarding Ext:  2 plus pulses, no edema, no cyanosis, no clubbing Skin:  No rashes no nodules Neuro:  CN II through XII intact, motor grossly intact  EKG - NSR with ventricular pacing  DEVICE  Normal device function.  See PaceArt for details.   Assess/Plan:  CHB - he is asymptomatic, s/p Biv PM upgrade.  PPM - his biv PPM is working normally. We will recheck in several months. Chronic systolic heart failure - his symptoms are class 2. I asked him to reduce his sodium intake. Dyslipidemia - continue crestor.   Carleene Overlie [place] Morren,MD

## 2020-11-18 NOTE — Telephone Encounter (Signed)
Please refill as per office routine med refill policy (all routine meds refilled for 3 mo or monthly per pt preference up to one year from last visit, then month to month grace period for 3 mo, then further med refills will have to be denied)  

## 2020-11-23 ENCOUNTER — Other Ambulatory Visit: Payer: Self-pay | Admitting: Internal Medicine

## 2020-12-02 NOTE — Progress Notes (Signed)
Remote pacemaker transmission.   

## 2020-12-03 ENCOUNTER — Ambulatory Visit: Payer: Medicare Other | Admitting: Cardiovascular Disease

## 2021-01-11 DIAGNOSIS — D485 Neoplasm of uncertain behavior of skin: Secondary | ICD-10-CM | POA: Diagnosis not present

## 2021-01-11 DIAGNOSIS — L570 Actinic keratosis: Secondary | ICD-10-CM | POA: Diagnosis not present

## 2021-01-11 DIAGNOSIS — L821 Other seborrheic keratosis: Secondary | ICD-10-CM | POA: Diagnosis not present

## 2021-01-11 DIAGNOSIS — L578 Other skin changes due to chronic exposure to nonionizing radiation: Secondary | ICD-10-CM | POA: Diagnosis not present

## 2021-01-13 ENCOUNTER — Telehealth: Payer: Self-pay | Admitting: Lab

## 2021-01-13 DIAGNOSIS — E119 Type 2 diabetes mellitus without complications: Secondary | ICD-10-CM | POA: Diagnosis not present

## 2021-01-13 DIAGNOSIS — H3554 Dystrophies primarily involving the retinal pigment epithelium: Secondary | ICD-10-CM | POA: Diagnosis not present

## 2021-01-13 DIAGNOSIS — H35053 Retinal neovascularization, unspecified, bilateral: Secondary | ICD-10-CM | POA: Diagnosis not present

## 2021-01-13 DIAGNOSIS — H348312 Tributary (branch) retinal vein occlusion, right eye, stable: Secondary | ICD-10-CM | POA: Diagnosis not present

## 2021-01-13 NOTE — Progress Notes (Signed)
  Chronic Care Management   Outreach Note  01/13/2021 Name: Daniel Woodard MRN: [id number] DOB: [birth year]  Referred by: Biagio Borg, MD Reason for referral : Medication Management   An unsuccessful telephone outreach was attempted today. The patient was referred to the pharmacist for assistance with care management and care coordination.   Follow Up Plan:   [place]

## 2021-01-14 ENCOUNTER — Encounter: Payer: Self-pay | Admitting: Internal Medicine

## 2021-01-14 ENCOUNTER — Ambulatory Visit: Payer: Medicare Other | Admitting: Internal Medicine

## 2021-01-14 ENCOUNTER — Other Ambulatory Visit: Payer: Self-pay

## 2021-01-14 DIAGNOSIS — J449 Chronic obstructive pulmonary disease, unspecified: Secondary | ICD-10-CM | POA: Diagnosis not present

## 2021-01-14 DIAGNOSIS — J84112 Idiopathic pulmonary fibrosis: Secondary | ICD-10-CM

## 2021-01-14 DIAGNOSIS — R634 Abnormal weight loss: Secondary | ICD-10-CM

## 2021-01-14 NOTE — Assessment & Plan Note (Addendum)
Reported 01/14/2021 assoc with constipation   Reviewed approp use of Mg containing laxative, referred back to PCP for f/u

## 2021-01-14 NOTE — Patient Instructions (Signed)
Make sure you check your oxygen saturation at your highest level of activity to be sure it stays over 90% and keep track of it at least once a week, more often if breathing getting worse, and let me know if losing ground.    Please schedule a follow up visit in 12 months but call sooner if needed

## 2021-01-14 NOTE — Assessment & Plan Note (Signed)
Quit smoking 2001 - PFTs 11/28/14  FEV 2.00 (66%) ratio 63 and no change p saba, dlco 47 corrects to 69 for alv vol - 07/05/2019  After extensive coaching inhaler device,  effectiveness =    75% > try Breztri 2bid > no change so did not fill rx  - 07/06/19  Alpha one phenotype MM  Level 132  - PFT's  01/02/2020  FEV1 1.65 (58 % ) ratio 0.55  p 24 % improvement from saba p 0 prior to study with DLCO  12.51 (50%) corrects to 2.46 (65%)  for alv volume and FV curve concave  - 01/02/2020  After extensive coaching inhaler device,  effectiveness =    75% > try dulera 100 2bid > no better so just rx prn saba     I reviewed the Fletcher curve with the patient that basically indicates  if you quit smoking when your best day FEV1 is still   preserved (as is relatively still true  here)  it is highly unlikely you will progress to severe disease and informed the patient there was  no medication on the market that has proven to alter the curve/ its downward trajectory  or the likelihood of progression of their disease(unlike other chronic medical conditions such as atheroclerosis where we do think we can change the natural hx with risk reducing meds)    Therefore stopping smoking and maintaining abstinence are  the most important aspects of care, not choice of inhalers or for that matter, doctors.   Treatment other than smoking cessation  is entirely directed by severity of symptoms and focused also on reducing exacerbations, not attempting to change the natural history of the disease.   Since no limiting doe or need for saba or flare of aecopd, no need for rx

## 2021-01-14 NOTE — Assessment & Plan Note (Signed)
Noted on cxr 07/05/2019 s acute changes clinically  - rec HRCT p levaquin 500 mg daily x 10 days  - HRCT chest 07/17/19 Spectrum of findings compatible with basilar predominant fibroticinterstitial lung disease with mild traction bronchiectasis and no frank honeycombing. Findings are categorized as probable UIP - 08/16/2019   Walked RA x 3 laps =  approx 777f @ nl pace - stopped due to end of study  with sats of 93 % at the end of the study s sob  - 01/02/2020 PFTs  Vs 5 y prior:   FVC down from 3.16 to 2.72 and DLC0 down from 16.48 to 12.51 neither value corrected for hgb  - 01/14/2021   Walked on RA x  2  lap(s) =  approx 500 @ slow/moderate pace, stopped due to end of study s sob with lowest 02 sats 94%     Again declined referral to PF clinic/ still not as active or monitoring sats as rec:  Make sure you check your oxygen saturation at your highest level of activity to be sure it stays over 90% and keep track of it at least once a week, more often if breathing getting worse, and let me know if losing ground.   F/u yearly, call sooner prn  Each maintenance medication was reviewed in detail including emphasizing most importantly the difference between maintenance and prns and under what circumstances the prns are to be triggered using an action plan format where appropriate.  Total time for H and P, chart review, counseling,  directly observing portions of ambulatory 02 saturation study/ and generating customized AVS unique to this office visit / same day charting  > 30 min

## 2021-01-14 NOTE — Progress Notes (Signed)
Daniel Woodard, male    DOB: 04/08/1930    MRN: [id number]   Brief patient profile:  61   yowm MM/quit smoking 2001 self referred 08/11/14 to pulmonary clinic with GOLD II/ copd documented 11/28/2014 @ MMRC=1 and lost to f/u on no  maint resp rx  Then early fall 2020 gradually worse sob        History of Present Illness  07/05/2019  Pulmonary/ 1st office eval/Zamarion Longest  Chief Complaint  Patient presents with   Consult    Patient is having shortness of breath with exertion and daily activites that started 6 months ago. Patient has a productive cough with grey sputum.   Dyspnea:  500 ft  To mailbox and has to sit on patio to get his breath = MMRC2 = can't walk a nl pace on a flat grade s sob but does fine slow and flat  Cough: varies mostly daytime / rattle min mucoid to brown occ in am  Sleep:  30-40 degrees on pillows   SABA use: none  rec Breztri Take 2 puffs first thing in am and then another 2 puffs about 12 hours later.  Work on inhaler technique:        07/17/19  HRCT c/w prob UIP      01/02/2020  f/u ov/Elvira Langston re: GOLD II/ AB component  Chief Complaint  Patient presents with   Follow-up    PFT done today.  Breathing is overall doing well. He c/o rhinitis.   Dyspnea:  Walking at [employer]/ walks driveway  Cough: rattles worse in am > white  Sleeping: ok resp wise  SABA use: none  02: none  rec Start  dulera 100 (or Breztri) take 2 puffs first thing in am and then another 2 puffs about 12 hours later.  Work on inhaler technique:  Please schedule a follow up visit in 3 months but call sooner if needed  - bring inhaler with you and your drug formulary (your list of cheaper alternative)    07/16/2020  f/u ov/Mikiyah Glasner re:  GOLD II/ AB component no better on dulera or breztri  No chief complaint on file.  Dyspnea:  Still walking sams  Cough: none  Sleeping: ok almost flat  SABA use: avg once daily  02: none  Covid status:  vax x 3   Rec Only use your albuterol as a rescue medication    Also ok to Try albuterol 15 min before an activity that you know would make you short of breath    Make sure you check your oxygen saturation at your highest level of activity to be sure it stays over 90%   Please schedule a follow up visit in 6 months but call sooner if needed  with all medications     01/14/2021  f/u ov/Teofilo Lupinacci re: COPD II/ AB component  and prob UIP   maint on no meds   Chief Complaint  Patient presents with   Follow-up    Doing well, some SOB with exertion, passed kidney stone few weeks ago    Dyspnea:  still walking s[employer] and [employer] leaning cart but not checking sats / overall very sedentary  Cough: none  Sleeping: bed is < 30 degrees/ sleeps on side SABA use: none 02: none Covid status:   vax x 3    No obvious day to day or daytime variability or assoc excess/ purulent sputum or mucus plugs or hemoptysis or cp or chest tightness, subjective wheeze or overt sinus  or hb symptoms.   Sleeping as above without nocturnal  or early am exacerbation  of respiratory  c/o's or need for noct saba. Also denies any obvious fluctuation of symptoms with weather or environmental changes or other aggravating or alleviating factors except as outlined above   No unusual exposure hx or h/o childhood pna/ asthma or knowledge of premature birth.  Current Allergies, Complete Past Medical History, Past Surgical History, Family History, and Social History were reviewed in [employer] record.  ROS  The following are not active complaints unless bolded Hoarseness, sore throat, dysphagia, dental problems, itching, sneezing,  nasal congestion or discharge of excess mucus or purulent secretions, ear ache,   fever, chills, sweats, unintended wt loss or wt gain, classically pleuritic or exertional cp,  orthopnea pnd or arm/hand swelling  or leg swelling, presyncope, palpitations, abdominal pain, anorexia, nausea, vomiting, diarrhea  or change in bowel habits/ constipation or  change in bladder habits, change in stools or change in urine, dysuria, hematuria,  rash, arthralgias, visual complaints, headache, numbness, weakness or ataxia or problems with walking or coordination,  change in mood or  memory.        Current Meds  Medication Sig   albuterol (VENTOLIN HFA) 108 (90 Base) MCG/ACT inhaler Inhale 2 puffs into the lungs every 6 (six) hours as needed for wheezing or shortness of breath.   ASPIRIN 81 PO Take 1 tablet by mouth daily.   Coenzyme Q10 (COQ-10) 200 MG CAPS Take 200 mg by mouth daily after lunch.   docusate sodium (COLACE) 100 MG capsule Take 1 capsule (100 mg total) by mouth 2 (two) times daily.   furosemide (LASIX) 40 MG tablet Take 1 tablet by mouth once daily   linaclotide (LINZESS) 72 MCG capsule Take 1 capsule (72 mcg total) by mouth daily before breakfast.   Magnesium 200 MG TABS Take 200 mg by mouth 2 (two) times daily.   metoprolol succinate (TOPROL-XL) 25 MG 24 hr tablet Take 1/2 (one-half) tablet by mouth once daily   omeprazole (PRILOSEC) 20 MG capsule Take 1 capsule by mouth once daily   potassium chloride SA (KLOR-CON) 20 MEQ tablet Take 1 tablet (20 mEq total) by mouth daily.   Psyllium (METAMUCIL PO) Take 2 capsules by mouth daily as needed (constipation).   rosuvastatin (CRESTOR) 20 MG tablet Take 1 tablet (20 mg total) by mouth every other day.   sennosides-docusate sodium (SENOKOT-S) 8.6-50 MG tablet Take 1 tablet by mouth at bedtime.   VITAMIN D PO Take 1,000 Units by mouth daily.   zinc gluconate 50 MG tablet Take 50 mg by mouth daily.                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   BENIGN PROSTATIC HYPERTROPHY 04/09/2007   Blood transfusion without reported diagnosis    really not sure thinks 30-40 years ago   BRADYCARDIA 05/11/2009   Carotid artery occlusion    COLONIC POLYPS, HX OF 04/09/2007    ADENOMATOUS POLYPS 2004,2008   Complete heart block ([facility])    COPD 04/09/2007   Cough 03/27/2008   DEPRESSION 04/09/2007   DIABETES MELLITUS, TYPE II 10/09/2007   DIVERTICULOSIS, COLON 04/09/2007   DIZZINESS 10/06/2008   GLUCOSE INTOLERANCE 04/09/2007   HYPERLIPIDEMIA 03/27/2007   HYPERTENSION 03/27/2007   PERIPHERAL VASCULAR DISEASE 03/27/2007   PNEUMONIA ORGANISM NOS 11/08/2007   SHINGLES,  HX OF 04/09/2007   SHOULDER PAIN, BILATERAL 08/01/2007   Ulcer    40 years ago       Objective:    01/14/2021       166  07/16/2020       172 01/02/2020         193   09/27/2019      197   08/16/19 195 lb (88.5 kg)  07/23/19 194 lb (88 kg)  07/05/19 194 lb (88 kg)    Vital signs reviewed  01/14/2021  - Note at rest 02 sats  96% on RA   General appearance:    well preserved pleasant amb wm nad       Full dentures    HEENT : pt wearing mask not removed for exam due to covid - 19 concerns.    NECK :  without JVD/Nodes/TM/ nl carotid upstrokes bilaterally   LUNGS: no acc muscle use,  Mild barrel  contour chest wall with bilateral  Distant insp crackles, no exp wheeze  and  without cough on insp or exp maneuvers  and mild  Hyperresonant  to  percussion bilaterally     CV:  RRR  no s3 or murmur or increase in P2, and no edema   ABD:  soft and nontender with pos end  insp Hoover's  in the supine position. No bruits or organomegaly appreciated, bowel sounds nl  MS:   Nl gait/  ext warm without deformities, calf tenderness, cyanosis or clubbing No obvious joint restrictions   SKIN: warm and dry without lesions    NEURO:  alert, approp, nl sensorium with  no motor or cerebellar deficits apparent.          Assessment

## 2021-01-26 ENCOUNTER — Other Ambulatory Visit: Payer: Self-pay | Admitting: Internal Medicine

## 2021-01-26 MED ORDER — ROSUVASTATIN CALCIUM 20 MG PO TABS: ORAL_TABLET | ORAL | 3 refills | Status: DC

## 2021-02-12 ENCOUNTER — Ambulatory Visit (INDEPENDENT_AMBULATORY_CARE_PROVIDER_SITE_OTHER): Payer: Medicare Other

## 2021-02-12 DIAGNOSIS — I442 Atrioventricular block, complete: Secondary | ICD-10-CM

## 2021-02-12 LAB — CUP PACEART REMOTE DEVICE CHECK
Battery Remaining Longevity: 130 mo
Battery Voltage: 3.14 V
Brady Statistic AP VP Percent: 38.54 %
Brady Statistic AP VS Percent: 0.01 %
Brady Statistic AS VP Percent: 61.41 %
Brady Statistic AS VS Percent: 0.04 %
Brady Statistic RA Percent Paced: 38.36 %
Brady Statistic RV Percent Paced: 99.95 %
Date Time Interrogation Session: 20220916062934
Implantable Lead Connection Status: 753985
Implantable Lead Connection Status: 753985
Implantable Lead Connection Status: 753985
Implantable Lead Implant Date: 20150212
Implantable Lead Implant Date: 20150212
Implantable Lead Implant Date: 20220318
Implantable Lead Location: 753858
Implantable Lead Location: 753859
Implantable Lead Location: 753860
Implantable Lead Model: 4598
Implantable Lead Model: 5076
Implantable Lead Model: 5076
Implantable Pulse Generator Implant Date: 20220318
Lead Channel Impedance Value: 342 Ohm
Lead Channel Impedance Value: 380 Ohm
Lead Channel Impedance Value: 380 Ohm
Lead Channel Impedance Value: 437 Ohm
Lead Channel Impedance Value: 456 Ohm
Lead Channel Impedance Value: 456 Ohm
Lead Channel Impedance Value: 456 Ohm
Lead Channel Impedance Value: 570 Ohm
Lead Channel Impedance Value: 627 Ohm
Lead Channel Impedance Value: 741 Ohm
Lead Channel Impedance Value: 741 Ohm
Lead Channel Impedance Value: 931 Ohm
Lead Channel Impedance Value: 931 Ohm
Lead Channel Impedance Value: 931 Ohm
Lead Channel Pacing Threshold Amplitude: 0.875 V
Lead Channel Pacing Threshold Amplitude: 1 V
Lead Channel Pacing Threshold Amplitude: 1 V
Lead Channel Pacing Threshold Pulse Width: 0.4 ms
Lead Channel Pacing Threshold Pulse Width: 0.4 ms
Lead Channel Pacing Threshold Pulse Width: 0.5 ms
Lead Channel Sensing Intrinsic Amplitude: 17.25 mV
Lead Channel Sensing Intrinsic Amplitude: 17.25 mV
Lead Channel Sensing Intrinsic Amplitude: 5 mV
Lead Channel Sensing Intrinsic Amplitude: 5 mV
Lead Channel Setting Pacing Amplitude: 1.5 V
Lead Channel Setting Pacing Amplitude: 1.75 V
Lead Channel Setting Pacing Amplitude: 2 V
Lead Channel Setting Pacing Pulse Width: 0.4 ms
Lead Channel Setting Pacing Pulse Width: 0.5 ms
Lead Channel Setting Sensing Sensitivity: 0.9 mV
Zone Setting Status: 755011

## 2021-02-17 NOTE — Progress Notes (Signed)
Remote pacemaker transmission.   

## 2021-02-24 ENCOUNTER — Encounter: Payer: Self-pay | Admitting: *Deleted

## 2021-02-24 ENCOUNTER — Ambulatory Visit (INDEPENDENT_AMBULATORY_CARE_PROVIDER_SITE_OTHER): Payer: Medicare Other | Admitting: *Deleted

## 2021-02-24 DIAGNOSIS — Z0000 Encounter for general adult medical examination without abnormal findings: Secondary | ICD-10-CM | POA: Diagnosis not present

## 2021-02-24 NOTE — Patient Instructions (Signed)
Health Maintenance, Male Adopting a healthy lifestyle and getting preventive care are important in promoting health and wellness. Ask your health care provider about: The right schedule for you to have regular tests and exams. Things you can do on your own to prevent diseases and keep yourself healthy. What should I know about diet, weight, and exercise? Eat a healthy diet  Eat a diet that includes plenty of vegetables, fruits, low-fat dairy products, and lean protein. Do not eat a lot of foods that are high in solid fats, added sugars, or sodium. Maintain a healthy weight Body mass index (BMI) is a measurement that can be used to identify possible weight problems. It estimates body fat based on height and weight. Your health care provider can help determine your BMI and help you achieve or maintain a healthy weight. Get regular exercise Get regular exercise. This is one of the most important things you can do for your health. Most adults should: Exercise for at least 150 minutes each week. The exercise should increase your heart rate and make you sweat (moderate-intensity exercise). Do strengthening exercises at least twice a week. This is in addition to the moderate-intensity exercise. Spend less time sitting. Even light physical activity can be beneficial. Watch cholesterol and blood lipids Have your blood tested for lipids and cholesterol at 85 years of age, then have this test every 5 years. You may need to have your cholesterol levels checked more often if: Your lipid or cholesterol levels are high. You are older than 85 years of age. You are at high risk for heart disease. What should I know about cancer screening? Many types of cancers can be detected early and may often be prevented. Depending on your health history and family history, you may need to have cancer screening at various ages. This may include screening for: Colorectal cancer. Prostate cancer. Skin cancer. Lung  cancer. What should I know about heart disease, diabetes, and high blood pressure? Blood pressure and heart disease High blood pressure causes heart disease and increases the risk of stroke. This is more likely to develop in people who have high blood pressure readings, are of African descent, or are overweight. Talk with your health care provider about your target blood pressure readings. Have your blood pressure checked: Every 3-5 years if you are 18-39 years of age. Every year if you are 40 years old or older. If you are between the ages of 65 and 75 and are a current or former smoker, ask your health care provider if you should have a one-time screening for abdominal aortic aneurysm (AAA). Diabetes Have regular diabetes screenings. This checks your fasting blood sugar level. Have the screening done: Once every three years after age 45 if you are at a normal weight and have a low risk for diabetes. More often and at a younger age if you are overweight or have a high risk for diabetes. What should I know about preventing infection? Hepatitis B If you have a higher risk for hepatitis B, you should be screened for this virus. Talk with your health care provider to find out if you are at risk for hepatitis B infection. Hepatitis C Blood testing is recommended for: Everyone born from 1945 through 1965. Anyone with known risk factors for hepatitis C. Sexually transmitted infections (STIs) You should be screened each year for STIs, including gonorrhea and chlamydia, if: You are sexually active and are younger than 85 years of age. You are older than 85 years   of age and your health care provider tells you that you are at risk for this type of infection. Your sexual activity has changed since you were last screened, and you are at increased risk for chlamydia or gonorrhea. Ask your health care provider if you are at risk. Ask your health care provider about whether you are at high risk for HIV.  Your health care provider may recommend a prescription medicine to help prevent HIV infection. If you choose to take medicine to prevent HIV, you should first get tested for HIV. You should then be tested every 3 months for as long as you are taking the medicine. Follow these instructions at home: Lifestyle Do not use any products that contain nicotine or tobacco, such as cigarettes, e-cigarettes, and chewing tobacco. If you need help quitting, ask your health care provider. Do not use street drugs. Do not share needles. Ask your health care provider for help if you need support or information about quitting drugs. Alcohol use Do not drink alcohol if your health care provider tells you not to drink. If you drink alcohol: Limit how much you have to 0-2 drinks a day. Be aware of how much alcohol is in your drink. In the [place] one drink equals one 12 oz bottle of beer (355 mL), one 5 oz glass of wine (148 mL), or one 1 oz glass of hard liquor (44 mL). General instructions Schedule regular health, dental, and eye exams. Stay current with your vaccines. Tell your health care provider if: You often feel depressed. You have ever been abused or do not feel safe at home. Summary Adopting a healthy lifestyle and getting preventive care are important in promoting health and wellness. Follow your health care provider's instructions about healthy diet, exercising, and getting tested or screened for diseases. Follow your health care provider's instructions on monitoring your cholesterol and blood pressure. This information is not intended to replace advice given to you by your health care provider. Make sure you discuss any questions you have with your health care provider. Document Revised: 07/24/2020 Document Reviewed: 05/09/2018 Elsevier Patient Education  2022 [employer]  

## 2021-02-24 NOTE — Progress Notes (Addendum)
Subjective:   Daniel Woodard is a 88 y.o. male who presents for Medicare Annual/Subsequent preventive examination.  I connected with  Elmer Bales on 02/24/21 by audio enabled telemedicine application and verified that I am speaking with the correct person using two identifiers.   I discussed the limitations of evaluation and management by telemedicine. The patient expressed understanding and agreed to proceed.   Location of Patient: Home Location of Provider: Office Persons participating in visit: Daniel Woodard (patient) & Jari Favre, CMA   Review of Systems    Defer to PCP Cardiac Risk Factors include: none     Objective:    There were no vitals filed for this visit. There is no height or weight on file to calculate BMI.  Advanced Directives 02/24/2021 08/14/2020 06/01/2020 11/18/2019 03/06/2019 10/19/2017 05/18/2017  Does Patient Have a Medical Advance Directive? Yes Yes No Yes No No No  Type of Paramedic of [place];Living will - - - - -  Does patient want to make changes to medical advance directive? - No - Patient declined - No - Patient declined - - -  Copy of [place] in Chart? Yes - validated most recent copy scanned in chart (See row information) No - copy requested - - - - -  Would patient like information on creating a medical advance directive? - - No - Patient declined - - No - Patient declined No - Patient declined    Current Medications (verified) Outpatient Encounter Medications as of 02/24/2021  Medication Sig   albuterol (VENTOLIN HFA) 108 (90 Base) MCG/ACT inhaler Inhale 2 puffs into the lungs every 6 (six) hours as needed for wheezing or shortness of breath.   ASPIRIN 81 PO Take 1 tablet by mouth daily.   Coenzyme Q10 (COQ-10) 200 MG CAPS Take 200 mg by mouth daily after lunch.   docusate sodium (COLACE) 100 MG capsule Take 1 capsule (100 mg total) by mouth 2 (two) times daily.   furosemide  (LASIX) 40 MG tablet Take 1 tablet by mouth once daily   linaclotide (LINZESS) 72 MCG capsule Take 1 capsule (72 mcg total) by mouth daily before breakfast.   Magnesium 200 MG TABS Take 200 mg by mouth 2 (two) times daily.   metoprolol succinate (TOPROL-XL) 25 MG 24 hr tablet Take 1/2 (one-half) tablet by mouth once daily   omeprazole (PRILOSEC) 20 MG capsule Take 1 capsule by mouth once daily   potassium chloride SA (KLOR-CON) 20 MEQ tablet Take 1 tablet (20 mEq total) by mouth daily.   Psyllium (METAMUCIL PO) Take 2 capsules by mouth daily as needed (constipation).   rosuvastatin (CRESTOR) 20 MG tablet 1 tablet for 2 days alternating with a day off in between   sennosides-docusate sodium (SENOKOT-S) 8.6-50 MG tablet Take 1 tablet by mouth at bedtime.   VITAMIN D PO Take 1,000 Units by mouth daily.   zinc gluconate 50 MG tablet Take 50 mg by mouth daily.   No facility-administered encounter medications on file as of 02/24/2021.    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   BENIGN PROSTATIC HYPERTROPHY 04/09/2007   Blood transfusion without reported diagnosis    really not sure thinks 30-40 years ago   BRADYCARDIA 05/11/2009   Carotid artery occlusion    COLONIC POLYPS, HX OF 04/09/2007  ADENOMATOUS POLYPS 8119,1478   Complete heart block (HCC)    COPD 04/09/2007   Cough 03/27/2008   DEPRESSION 04/09/2007   DIABETES MELLITUS, TYPE II 10/09/2007   DIVERTICULOSIS, COLON 04/09/2007   DIZZINESS 10/06/2008   GLUCOSE INTOLERANCE 04/09/2007   HYPERLIPIDEMIA 03/27/2007   HYPERTENSION 03/27/2007   PERIPHERAL VASCULAR DISEASE 03/27/2007   PNEUMONIA ORGANISM NOS 11/08/2007   SHINGLES, HX OF 04/09/2007   SHOULDER PAIN, BILATERAL 08/01/2007   Ulcer    40 years ago   Past Surgical History:  Procedure Laterality Date   APPENDECTOMY      87 years old   BIV UPGRADE N/A 08/14/2020   Procedure: BIV PPM UPGRADE;  Surgeon: Evans Lance, MD;  Location: [facility];  Service: Cardiovascular;  Laterality: N/A;   CAROTID ENDARTERECTOMY  5/08   left   CARPAL TUNNEL RELEASE     bilateral   COLONOSCOPY     ESOPHAGOGASTRODUODENOSCOPY  06/07/2004   HEMORRHOID BANDING  1990s   LEFT HEART CATH AND CORONARY ANGIOGRAPHY N/A 06/02/2020   Procedure: LEFT HEART CATH AND CORONARY ANGIOGRAPHY;  Surgeon: Jettie Booze, MD;  Location: [facility];  Service: Cardiovascular;  Laterality: N/A;   PACEMAKER INSERTION  07/11/2013   MDT ADDRL1 pacemaker implanted by Dr Lovena Le for complete heart block   PERMANENT PACEMAKER INSERTION N/A 07/11/2013   Procedure: PERMANENT PACEMAKER INSERTION;  Surgeon: Evans Lance, MD;  [facility];  Service: Cardiovascular;  Laterality: N/A;   s/p PUD surgury     ? partial gastrectomy   Family History  Problem Relation Age of Onset   Hypertension Father    Ulcers Father    Hypertension Mother    Hypertension Sister    Peripheral vascular disease Brother    Hypertension Sister    Ulcers Sister        Ulcers   Hypertension Sister    Lung cancer Sister        smoked   Heart disease Sister        Carotid    Hypertension Sister    Heart disease Other        Cardiovascular disorder, CHF   Stroke Other        1st degree relative male and male   Colon cancer Neg Hx    Social History   Socioeconomic History   Marital status: Widowed    Spouse name: Not on file   Number of children: 2   Years of education: Not on file   Highest education level: Not on file  Occupational History   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  Tobacco Use   Smoking status: Former    Packs/day: 1.00    Years: 50.00    Pack years: 50.00    Types: Cigarettes    Quit date: 08/20/1999    Years since quitting: 21.5   Smokeless tobacco: Never  Vaping Use    Vaping Use: Never used  Substance and Sexual Activity   Alcohol use: No    Alcohol/week: 0.0 standard drinks   Drug use: No   Sexual activity: Not on file  Other Topics Concern   Not on file  Social History Narrative   Widowed; wife dies 09/02/2018   Daily caffeine    Social Determinants of Health   Financial Resource Strain: Low Risk    Difficulty of Paying Living Expenses: Not hard at all  Food Insecurity: No Food Insecurity   Worried About  Running Out of Food in the Last Year: Never true   Ran Out of Food in the Last Year: Never true  Transportation Needs: No Transportation Needs   Lack of Transportation (Medical): No   Lack of Transportation (Non-Medical): No  Physical Activity: Insufficiently Active   Days of Exercise per Week: 3 days   Minutes of Exercise per Session: 10 min  Stress: No Stress Concern Present   Feeling of Stress : Not at all  Social Connections: Socially Isolated   Frequency of Communication with Friends and Family: More than three times a week   Frequency of Social Gatherings with Friends and Family: Twice a week   Attends Religious Services: Never   Marine scientist or Organizations: Not on file   Attends Archivist Meetings: Never   Marital Status: Widowed    Tobacco Counseling Counseling given: Not Answered   Clinical Intake:     Pain : No/denies pain     BMI - recorded: 22.56 Nutritional Status: BMI of 19-24  Normal Diabetes: No     Diabetic? No  Interpreter Needed?: No      Activities of Daily Living In your present state of health, do you have any difficulty performing the following activities: 02/24/2021 06/01/2020  Hearing? N -  Vision? N -  Difficulty concentrating or making decisions? N -  Walking or climbing stairs? N -  Dressing or bathing? N -  Doing errands, shopping? N Y  Comment daughter usually goes -  Conservation officer, nature and eating ? N -  Using the Toilet? N -  In the past six months, have you  accidently leaked urine? N -  Do you have problems with loss of bowel control? N -  Managing your Medications? N -  Managing your Finances? N -  Housekeeping or managing your Housekeeping? N -  Some recent data might be hidden    Patient Care Team: Biagio Borg, MD as PCP - General Croitoru, Dani Gobble, MD as PCP - Cardiology (Cardiology) Evans Lance, MD as PCP - Electrophysiology (Cardiology) Evans Lance, MD as Consulting Physician (Cardiology) Tanda Rockers, MD as Consulting Physician (Pulmonary Disease) Antionette Fairy Isaias Cowman, MD as Referring Physician (Ophthalmology)  Indicate any recent Medical Services you may have received from other than [facility] providers in the past year (date may be approximate).     Assessment:   This is a routine wellness examination for Daniel Woodard.  Hearing/Vision screen No results found.  Dietary issues and exercise activities discussed: Current Exercise Habits: Home exercise routine, Type of exercise: walking, Time (Minutes): 10, Frequency (Times/Week): 3, Weekly Exercise (Minutes/Week): 30, Intensity: Mild, Exercise limited by: None identified   Goals Addressed   None    Depression Screen PHQ 2/9 Scores 02/24/2021 09/10/2020 11/20/2019 11/20/2019 11/18/2019 11/22/2018 05/17/2018  PHQ - 2 Score 0 0 1 0 0 1 0  PHQ- 9 Score - - - 0 - - -    Fall Risk Fall Risk  02/24/2021 09/10/2020 11/20/2019 11/18/2019 11/22/2018  Falls in the past year? 0 0 0 0 0  Number falls in past yr: 0 - - 0 -  Injury with Fall? 0 - - 0 -  Risk for fall due to : - - - No Fall Risks -  Follow up - - - Falls evaluation completed -    FALL RISK PREVENTION PERTAINING TO THE HOME:  Any stairs in or around the home? No  If so, are there any without handrails?  N/A Home free of loose throw rugs in walkways, pet beds, electrical cords, etc? Yes  Adequate lighting in your home to reduce risk of falls? Yes   ASSISTIVE DEVICES UTILIZED TO PREVENT FALLS:  Life alert? No  Use of a  cane, walker or w/c? No  Grab bars in the bathroom? Yes  Shower chair or bench in shower? Yes  Elevated toilet seat or a handicapped toilet? Yes   TIMED UP AND GO:  Was the test performed?  n/a .  Length of time to ambulate 10 feet: n/a sec.     Cognitive Function: MMSE - Mini Mental State Exam 05/18/2017  Orientation to time 5  Orientation to Place 5  Registration 3  Attention/ Calculation 5  Recall 2  Language- name 2 objects 2  Language- repeat 1  Language- follow 3 step command 3  Language- read & follow direction 1  Write a sentence 1  Copy design 1  Total score 29     6CIT Screen 02/24/2021  What Year? 0 points  What month? 0 points  What time? 0 points  Count back from 20 0 points  Months in reverse 2 points  Repeat phrase 10 points  Total Score 12    Immunizations Immunization History  Administered Date(s) Administered   Fluad Quad(high Dose 65+) 02/27/2019   H1N1 05/05/2008   Influenza Split 05/19/2011, 03/06/2012   Influenza Whole 03/30/2000, 03/17/2008, 04/08/2009, 03/17/2010   Influenza, High Dose Seasonal PF 02/03/2015, 02/10/2016, 03/10/2017   Influenza,inj,Quad PF,6+ Mos 03/07/2013, 02/13/2014   PFIZER(Purple Top)SARS-COV-2 Vaccination 06/13/2019, 07/03/2019, 03/03/2020, 09/08/2020   Pneumococcal Conjugate-13 05/21/2013   Pneumococcal Polysaccharide-23 03/30/2005, 05/13/2010   Td 07/29/1998, 04/08/2009   Tdap 11/22/2018   Zoster, Live 04/07/2008    TDAP status: Up to date  Flu Vaccine status: Due, Education has been provided regarding the importance of this vaccine. Advised may receive this vaccine at local pharmacy or Health Dept. Aware to provide a copy of the vaccination record if obtained from local pharmacy or Health Dept. Verbalized acceptance and understanding.  Pneumococcal vaccine status: Up to date  Covid-19 vaccine status: Information provided on how to obtain vaccines.   Qualifies for Shingles Vaccine? No   Zostavax completed  No   Shingrix Completed?: No.    Education has been provided regarding the importance of this vaccine. Patient has been advised to call insurance company to determine out of pocket expense if they have not yet received this vaccine. Advised may also receive vaccine at local pharmacy or Health Dept. Verbalized acceptance and understanding.  Screening Tests Health Maintenance  Topic Date Due   URINE MICROALBUMIN  11/19/2020   HEMOGLOBIN A1C  11/30/2020   COVID-19 Vaccine (5 - Booster for Pfizer series) 03/12/2021 (Originally 01/08/2021)   Zoster Vaccines- Shingrix (1 of 2) 05/26/2021 (Originally 02/12/1949)   INFLUENZA VACCINE  08/27/2021 (Originally 12/28/2020)   FOOT EXAM  02/24/2022 (Originally 11/19/2020)   OPHTHALMOLOGY EXAM  01/13/2022   TETANUS/TDAP  11/21/2028   HPV VACCINES  Aged Out    Health Maintenance  Health Maintenance Due  Topic Date Due   URINE MICROALBUMIN  11/19/2020   HEMOGLOBIN A1C  11/30/2020    Colorectal cancer screening: No longer required.   Lung Cancer Screening: (Low Dose CT Chest recommended if Age 45-80 years, 30 pack-year currently smoking OR have quit w/in 15years.) does not qualify.    Additional Screening:  Hepatitis C Screening: does not qualify;   Vision Screening: Recommended annual ophthalmology exams for early detection  of glaucoma and other disorders of the eye. Is the patient up to date with their annual eye exam?  Yes  Who is the provider or what is the name of the office in which the patient attends annual eye exams?  Dr. Darcey Nora If pt is not established with a provider, would they like to be referred to a provider to establish care? No .   Dental Screening: Recommended annual dental exams for proper oral hygiene  Community Resource Referral / Chronic Care Management: CRR required this visit?  No   CCM required this visit?  No      Plan:     I have personally reviewed and noted the following in the patient's chart:    Medical and social history Use of alcohol, tobacco or illicit drugs  Current medications and supplements including opioid prescriptions. Patient is not currently taking opioid prescriptions. Functional ability and status Nutritional status Physical activity Advanced directives List of other physicians Hospitalizations, surgeries, and ER visits in previous 12 months Vitals Screenings to include cognitive, depression, and falls Referrals and appointments  In addition, I have reviewed and discussed with patient certain preventive protocols, quality metrics, and best practice recommendations. A written personalized care plan for preventive services as well as general preventive health recommendations were provided to patient.     Cannon Kettle, [place]   02/24/2021   Nurse Notes: 15 minutes non face to face   Daniel Woodard , Thank you for taking time to come for your Medicare Wellness Visit. I appreciate your ongoing commitment to your health goals. Please review the following plan we discussed and let me know if I can assist you in the future.   These are the goals we discussed:  Goals      Patient Stated     Stay as healthy and as independent as possible, continue to exercise and eat healthy.        This is a list of the screening recommended for you and due dates:  Health Maintenance  Topic Date Due   Urine Protein Check  11/19/2020   Hemoglobin A1C  11/30/2020   COVID-19 Vaccine (5 - Booster for Pfizer series) 03/12/2021*   Zoster (Shingles) Vaccine (1 of 2) 05/26/2021*   Flu Shot  08/27/2021*   Complete foot exam   02/24/2022*   Eye exam for diabetics  01/13/2022   Tetanus Vaccine  11/21/2028   HPV Vaccine  Aged Out  *Topic was postponed. The date shown is not the original due date.      Medical screening examination/treatment/procedure(s) were performed by non-physician practitioner and as supervising physician I was immediately available for  consultation/collaboration.  I agree with above. Cathlean Cower, MD

## 2021-03-18 ENCOUNTER — Ambulatory Visit: Payer: Medicare Other | Admitting: Internal Medicine

## 2021-03-18 ENCOUNTER — Other Ambulatory Visit: Payer: Self-pay

## 2021-03-18 ENCOUNTER — Encounter: Payer: Self-pay | Admitting: Internal Medicine

## 2021-03-18 VITALS — BP 132/80 | HR 83 | Temp 98.2°F | Ht 72.0 in | Wt 170.2 lb

## 2021-03-18 DIAGNOSIS — N1832 Chronic kidney disease, stage 3b: Secondary | ICD-10-CM

## 2021-03-18 DIAGNOSIS — E1165 Type 2 diabetes mellitus with hyperglycemia: Secondary | ICD-10-CM | POA: Diagnosis not present

## 2021-03-18 DIAGNOSIS — Z0001 Encounter for general adult medical examination with abnormal findings: Secondary | ICD-10-CM | POA: Diagnosis not present

## 2021-03-18 DIAGNOSIS — E559 Vitamin D deficiency, unspecified: Secondary | ICD-10-CM

## 2021-03-18 DIAGNOSIS — E538 Deficiency of other specified B group vitamins: Secondary | ICD-10-CM | POA: Diagnosis not present

## 2021-03-18 DIAGNOSIS — I10 Essential (primary) hypertension: Secondary | ICD-10-CM

## 2021-03-18 DIAGNOSIS — E7800 Pure hypercholesterolemia, unspecified: Secondary | ICD-10-CM | POA: Diagnosis not present

## 2021-03-18 LAB — CBC WITH DIFFERENTIAL/PLATELET
Basophils Absolute: 0.1 10*3/uL (ref 0.0–0.1)
Basophils Relative: 0.7 % (ref 0.0–3.0)
Eosinophils Absolute: 0.1 10*3/uL (ref 0.0–0.7)
Eosinophils Relative: 1.9 % (ref 0.0–5.0)
HCT: 49.2 % (ref 39.0–52.0)
Hemoglobin: 16.3 g/dL (ref 13.0–17.0)
Lymphocytes Relative: 18.8 % (ref 12.0–46.0)
Lymphs Abs: 1.4 10*3/uL (ref 0.7–4.0)
MCHC: 33.1 g/dL (ref 30.0–36.0)
MCV: 87.7 fl (ref 78.0–100.0)
Monocytes Absolute: 0.8 10*3/uL (ref 0.1–1.0)
Monocytes Relative: 11.1 % (ref 3.0–12.0)
Neutro Abs: 4.9 10*3/uL (ref 1.4–7.7)
Neutrophils Relative %: 67.5 % (ref 43.0–77.0)
Platelets: 170 10*3/uL (ref 150.0–400.0)
RBC: 5.61 Mil/uL (ref 4.22–5.81)
RDW: 15.4 % (ref 11.5–15.5)
WBC: 7.3 10*3/uL (ref 4.0–10.5)

## 2021-03-18 LAB — VITAMIN D 25 HYDROXY (VIT D DEFICIENCY, FRACTURES): VITD: 76.53 ng/mL (ref 30.00–100.00)

## 2021-03-18 LAB — URINALYSIS, ROUTINE W REFLEX MICROSCOPIC
Bilirubin Urine: NEGATIVE
Hgb urine dipstick: NEGATIVE
Ketones, ur: NEGATIVE
Leukocytes,Ua: NEGATIVE
Nitrite: NEGATIVE
RBC / HPF: NONE SEEN (ref 0–?)
Specific Gravity, Urine: 1.02 (ref 1.000–1.030)
Urine Glucose: NEGATIVE
Urobilinogen, UA: 0.2 (ref 0.0–1.0)
WBC, UA: NONE SEEN (ref 0–?)
pH: 6 (ref 5.0–8.0)

## 2021-03-18 LAB — LIPID PANEL
Cholesterol: 161 mg/dL (ref 0–200)
HDL: 39.3 mg/dL (ref >39.00)
LDL Cholesterol: 96 mg/dL (ref 0–99)
NonHDL: 121.99
Total CHOL/HDL Ratio: 4
Triglycerides: 129 mg/dL (ref 0.0–149.0)
VLDL: 25.8 mg/dL (ref 0.0–40.0)

## 2021-03-18 LAB — HEPATIC FUNCTION PANEL
ALT: 16 U/L (ref 0–53)
AST: 20 U/L (ref 0–37)
Albumin: 4.3 g/dL (ref 3.5–5.2)
Alkaline Phosphatase: 72 U/L (ref 39–117)
Bilirubin, Direct: 0.1 mg/dL (ref 0.0–0.3)
Total Bilirubin: 0.8 mg/dL (ref 0.2–1.2)
Total Protein: 7.8 g/dL (ref 6.0–8.3)

## 2021-03-18 LAB — HEMOGLOBIN A1C: Hgb A1c MFr Bld: 6.3 % (ref 4.6–6.5)

## 2021-03-18 LAB — BASIC METABOLIC PANEL
BUN: 32 mg/dL — ABNORMAL HIGH (ref 6–23)
CO2: 29 mEq/L (ref 19–32)
Calcium: 9.5 mg/dL (ref 8.4–10.5)
Chloride: 101 mEq/L (ref 96–112)
Creatinine, Ser: 1.68 mg/dL — ABNORMAL HIGH (ref 0.40–1.50)
GFR: 35.91 mL/min — ABNORMAL LOW (ref >60.00)
Glucose, Bld: 109 mg/dL — ABNORMAL HIGH (ref 70–99)
Potassium: 4.7 mEq/L (ref 3.5–5.1)
Sodium: 138 mEq/L (ref 135–145)

## 2021-03-18 LAB — MICROALBUMIN / CREATININE URINE RATIO
Creatinine,U: 168.3 mg/dL
Microalb Creat Ratio: 6 mg/g (ref 0.0–30.0)
Microalb, Ur: 10.1 mg/dL — ABNORMAL HIGH (ref 0.0–1.9)

## 2021-03-18 LAB — VITAMIN B12: Vitamin B-12: 389 pg/mL (ref 211–911)

## 2021-03-18 LAB — TSH: TSH: 4.19 u[IU]/mL (ref 0.35–5.50)

## 2021-03-18 NOTE — Patient Instructions (Addendum)
Please consider the Novavax covid vaccine  Please continue all other medications as before, and refills have been done if requested.  Please have the pharmacy call with any other refills you may need.  Please continue your efforts at being more active, low cholesterol diet, and weight control.  You are otherwise up to date with prevention measures today.  Please keep your appointments with your specialists as you may have planned  Please go to the LAB at the blood drawing area for the tests to be done  You will be contacted by phone if any changes need to be made immediately.  Otherwise, you will receive a letter about your results with an explanation, but please check with MyChart first.  Please remember to sign up for MyChart if you have not done so, as this will be important to you in the future with finding out test results, communicating by private email, and scheduling acute appointments online when needed.  Please make an Appointment to return in 6 months, or sooner if needed

## 2021-03-18 NOTE — Progress Notes (Signed)
Patient ID: Daniel Woodard, male   DOB: [birth year], 87 y.o.   MRN: [id number]         Chief Complaint:: wellness exam and Follow-up  Dm, ckd, hld, htn       HPI:  Daniel Woodard is a 87 y.o. male here for wellness exam; declines covid booster, shingrix, o/w up to date                         Also Pt denies chest pain, increased sob or doe, wheezing, orthopnea, PND, increased LE swelling, palpitations, dizziness or syncope.   Pt denies polydipsia, polyuria, or new focal neuro s/s.   Pt denies fever, wt loss, night sweats, loss of appetite, or other constitutional symptoms      Wt Readings from Last 3 Encounters:  03/18/21 170 lb 3.2 oz (77.2 kg)  01/14/21 166 lb 6.4 oz (75.5 kg)  11/18/20 172 lb 9.6 oz (78.3 kg)   BP Readings from Last 3 Encounters:  03/18/21 132/80  01/14/21 120/74  11/18/20 112/70   Immunization History  Administered Date(s) Administered   Fluad Quad(high Dose 65+) 02/27/2019   H1N1 05/05/2008   Influenza Split 05/19/2011, 03/06/2012   Influenza Whole 03/30/2000, 03/17/2008, 04/08/2009, 03/17/2010   Influenza, High Dose Seasonal PF 02/03/2015, 02/10/2016, 03/10/2017   Influenza,inj,Quad PF,6+ Mos 03/07/2013, 02/13/2014   PFIZER(Purple Top)SARS-COV-2 Vaccination 06/13/2019, 07/03/2019, 03/03/2020, 09/08/2020   Pneumococcal Conjugate-13 05/21/2013   Pneumococcal Polysaccharide-23 03/30/2005, 05/13/2010   Td 07/29/1998, 04/08/2009   Tdap 11/22/2018   Zoster, Live 04/07/2008   There are no preventive care reminders to display for this patient.     Past Medical History:  Diagnosis Date   ALLERGIC RHINITIS 04/09/2007   Atrial fibrillation ([place])    BACTEREMIA, 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   BENIGN PROSTATIC HYPERTROPHY 04/09/2007   Blood transfusion without reported diagnosis    really not sure thinks 30-40 years ago   BRADYCARDIA 05/11/2009   Carotid artery occlusion    COLONIC POLYPS, HX OF 04/09/2007   ADENOMATOUS POLYPS 2004,2008   Complete  heart block ([facility])    COPD 04/09/2007   Cough 03/27/2008   DEPRESSION 04/09/2007   DIABETES MELLITUS, TYPE II 10/09/2007   DIVERTICULOSIS, COLON 04/09/2007   DIZZINESS 10/06/2008   GLUCOSE INTOLERANCE 04/09/2007   HYPERLIPIDEMIA 03/27/2007   HYPERTENSION 03/27/2007   PERIPHERAL VASCULAR DISEASE 03/27/2007   PNEUMONIA ORGANISM NOS 11/08/2007   SHINGLES, HX OF 04/09/2007   SHOULDER PAIN, BILATERAL 08/01/2007   Ulcer    40 years ago   Past Surgical History:  Procedure Laterality Date   APPENDECTOMY     87 years old   BIV UPGRADE N/A 08/14/2020   Procedure: BIV PPM UPGRADE;  Surgeon: Evans Lance, MD;  Location: [facility];  Service: Cardiovascular;  Laterality: N/A;   CAROTID ENDARTERECTOMY  5/08   left   CARPAL TUNNEL RELEASE     bilateral   COLONOSCOPY     ESOPHAGOGASTRODUODENOSCOPY  06/07/2004   HEMORRHOID BANDING  1990s   LEFT HEART CATH AND CORONARY ANGIOGRAPHY N/A 06/02/2020   Procedure: LEFT HEART CATH AND CORONARY ANGIOGRAPHY;  Surgeon: Jettie Booze, MD;  Location: [facility];  Service: Cardiovascular;  Laterality: N/A;   PACEMAKER INSERTION  07/11/2013   MDT ADDRL1 pacemaker implanted by Dr Lovena Le for complete heart block   PERMANENT PACEMAKER INSERTION N/A 07/11/2013   Procedure: PERMANENT PACEMAKER INSERTION;  Surgeon: Evans Lance, MD;  Location: [facility];  Service: Cardiovascular;  Laterality: N/A;   s/p PUD surgury     ? partial gastrectomy    reports that he quit smoking about 21 years ago. His smoking use included cigarettes. He has a 50.00 pack-year smoking history. He has never used smokeless tobacco. He reports that he does not drink alcohol and does not use drugs. family history includes Heart disease in his sister and another family member; Hypertension in his father, mother, sister, sister, sister, and sister; Lung cancer in his sister; Peripheral vascular disease in his brother; Stroke in an other family member; Ulcers in his father and  sister. Allergies  Allergen Reactions   Ace Inhibitors Cough        Current Outpatient Medications on File Prior to Visit  Medication Sig Dispense Refill   ASPIRIN 81 PO Take 1 tablet by mouth daily.     Coenzyme Q10 (COQ-10) 200 MG CAPS Take 200 mg by mouth daily after lunch.     furosemide (LASIX) 40 MG tablet Take 1 tablet by mouth once daily 90 tablet 1   linaclotide (LINZESS) 72 MCG capsule Take 1 capsule (72 mcg total) by mouth daily before breakfast. 30 capsule 5   Magnesium 200 MG TABS Take 200 mg by mouth 2 (two) times daily.     metoprolol succinate (TOPROL-XL) 25 MG 24 hr tablet Take 1/2 (one-half) tablet by mouth once daily 45 tablet 3   omeprazole (PRILOSEC) 20 MG capsule Take 1 capsule by mouth once daily 90 capsule 3   potassium chloride SA (KLOR-CON) 20 MEQ tablet Take 1 tablet (20 mEq total) by mouth daily. 90 tablet 3   Psyllium (METAMUCIL PO) Take 2 capsules by mouth daily as needed (constipation).     rosuvastatin (CRESTOR) 20 MG tablet 1 tablet for 2 days alternating with a day off in between 60 tablet 3   sennosides-docusate sodium (SENOKOT-S) 8.6-50 MG tablet Take 1 tablet by mouth at bedtime. 90 tablet 3   VITAMIN D PO Take 1,000 Units by mouth daily.     zinc gluconate 50 MG tablet Take 50 mg by mouth daily.     No current facility-administered medications on file prior to visit.        ROS:  All others reviewed and negative.  Objective        PE:  BP 132/80 (BP Location: Right Arm, Patient Position: Sitting, Cuff Size: Normal)   Pulse 83   Temp 98.2 F (36.8 C) (Oral)   Ht 6' (1.829 m)   Wt 170 lb 3.2 oz (77.2 kg)   SpO2 97%   BMI 23.08 kg/m                 Constitutional: Pt appears in NAD               HENT: Head: NCAT.                Right Ear: External ear normal.                 Left Ear: External ear normal.                Eyes: . Pupils are equal, round, and reactive to light. Conjunctivae and EOM are normal               Nose: without d/c  or deformity               Neck: Neck supple. Gross normal ROM  Cardiovascular: Normal rate and regular rhythm.                 Pulmonary/Chest: Effort normal and breath sounds without rales or wheezing.                Abd:  Soft, NT, ND, + BS, no organomegaly               Neurological: Pt is alert. At baseline orientation, motor grossly intact               Skin: Skin is warm. No rashes, no other new lesions, LE edema - none               Psychiatric: Pt behavior is normal without agitation   Micro: none  Cardiac tracings I have personally interpreted today:  none  Pertinent Radiological findings (summarize): none   Lab Results  Component Value Date   WBC 7.3 03/18/2021   HGB 16.3 03/18/2021   HCT 49.2 03/18/2021   PLT 170.0 03/18/2021   GLUCOSE 109 (H) 03/18/2021   CHOL 161 03/18/2021   TRIG 129.0 03/18/2021   HDL 39.30 03/18/2021   LDLDIRECT 189.9 10/02/2007   LDLCALC 96 03/18/2021   ALT 16 03/18/2021   AST 20 03/18/2021   NA 138 03/18/2021   K 4.7 03/18/2021   CL 101 03/18/2021   CREATININE 1.68 (H) 03/18/2021   BUN 32 (H) 03/18/2021   CO2 29 03/18/2021   TSH 4.19 03/18/2021   PSA 0.92 11/20/2019   INR 1.1 03/06/2019   HGBA1C 6.3 03/18/2021   MICROALBUR 10.1 (H) 03/18/2021   Assessment/Plan:  Daniel Woodard is a 87 y.o. White or Caucasian [1] male with  has a past medical history of ALLERGIC RHINITIS (04/09/2007), Atrial fibrillation ([place]), BACTEREMIA, MYCOBACTERIUM AVIUM COMPLEX (04/09/2007), BENIGN PROSTATIC HYPERTROPHY (04/09/2007), Blood transfusion without reported diagnosis, BRADYCARDIA (05/11/2009), Carotid artery occlusion, COLONIC POLYPS, HX OF (04/09/2007), Complete heart block ([place]), COPD (04/09/2007), Cough (03/27/2008), DEPRESSION (04/09/2007), DIABETES MELLITUS, TYPE II (10/09/2007), DIVERTICULOSIS, COLON (04/09/2007), DIZZINESS (10/06/2008), GLUCOSE INTOLERANCE (04/09/2007), HYPERLIPIDEMIA (03/27/2007), HYPERTENSION (03/27/2007), PERIPHERAL  VASCULAR DISEASE (03/27/2007), PNEUMONIA ORGANISM NOS (11/08/2007), SHINGLES, HX OF (04/09/2007), SHOULDER PAIN, BILATERAL (08/01/2007), and Ulcer.  Encounter for well adult exam with abnormal findings Age and sex appropriate education and counseling updated with regular exercise and diet Referrals for preventative services - none needed Immunizations addressed - decliens covid booster and shingirx Smoking counseling  - none needed Evidence for depression or other mood disorder - none significant Most recent labs reviewed. I have personally reviewed and have noted: 1) the patient's medical and social history 2) The patient's current medications and supplements 3) The patient's height, weight, and BMI have been recorded in the chart   Diabetes [facility]) Lab Results  Component Value Date   HGBA1C 6.3 03/18/2021   Stable, pt to continue current medical treatment  - diet   Hyperlipidemia Lab Results  Component Value Date   [place] 96 03/18/2021   ucontrolled, goal ldl < 70, pt to continue current statin crestor 20, declines increase for now   Essential hypertension BP Readings from Last 3 Encounters:  03/18/21 132/80  01/14/21 120/74  11/18/20 112/70   Stable, pt to continue medical treatment toprol   CKD (chronic kidney disease), stage III ([place]) Lab Results  Component Value Date   CREATININE 1.68 (H) 03/18/2021   Stable overall, cont to avoid nephrotoxins, f/u renal as planned  Followup: Return in about 6 months (around 09/16/2021).  Cathlean Cower, MD  03/21/2021 3:00 PM [facility]

## 2021-03-19 DIAGNOSIS — D485 Neoplasm of uncertain behavior of skin: Secondary | ICD-10-CM | POA: Diagnosis not present

## 2021-03-19 DIAGNOSIS — C4442 Squamous cell carcinoma of skin of scalp and neck: Secondary | ICD-10-CM | POA: Diagnosis not present

## 2021-03-19 DIAGNOSIS — Z23 Encounter for immunization: Secondary | ICD-10-CM | POA: Diagnosis not present

## 2021-03-19 DIAGNOSIS — L570 Actinic keratosis: Secondary | ICD-10-CM | POA: Diagnosis not present

## 2021-03-19 DIAGNOSIS — C44529 Squamous cell carcinoma of skin of other part of trunk: Secondary | ICD-10-CM | POA: Diagnosis not present

## 2021-03-21 ENCOUNTER — Encounter: Payer: Self-pay | Admitting: Internal Medicine

## 2021-03-21 NOTE — Assessment & Plan Note (Signed)
Lab Results  Component Value Date   HGBA1C 6.3 03/18/2021   Stable, pt to continue current medical treatment  - diet

## 2021-03-21 NOTE — Assessment & Plan Note (Signed)
Lab Results  Component Value Date   LDLCALC 96 03/18/2021   ucontrolled, goal ldl < 70, pt to continue current statin crestor 20, declines increase for now

## 2021-03-21 NOTE — Assessment & Plan Note (Signed)
BP Readings from Last 3 Encounters:  03/18/21 132/80  01/14/21 120/74  11/18/20 112/70   Stable, pt to continue medical treatment toprol

## 2021-03-21 NOTE — Assessment & Plan Note (Signed)
Age and sex appropriate education and counseling updated with regular exercise and diet Referrals for preventative services - none needed Immunizations addressed - decliens covid booster and shingirx Smoking counseling  - none needed Evidence for depression or other mood disorder - none significant Most recent labs reviewed. I have personally reviewed and have noted: 1) the patient's medical and social history 2) The patient's current medications and supplements 3) The patient's height, weight, and BMI have been recorded in the chart

## 2021-03-21 NOTE — Assessment & Plan Note (Signed)
Lab Results  Component Value Date   CREATININE 1.68 (H) 03/18/2021   Stable overall, cont to avoid nephrotoxins, f/u renal as planned

## 2021-05-14 ENCOUNTER — Ambulatory Visit (INDEPENDENT_AMBULATORY_CARE_PROVIDER_SITE_OTHER): Payer: Medicare Other

## 2021-05-14 DIAGNOSIS — I442 Atrioventricular block, complete: Secondary | ICD-10-CM | POA: Diagnosis not present

## 2021-05-14 LAB — CUP PACEART REMOTE DEVICE CHECK
Battery Remaining Longevity: 127 mo
Battery Voltage: 3.07 V
Brady Statistic AP VP Percent: 33.74 %
Brady Statistic AP VS Percent: 0.01 %
Brady Statistic AS VP Percent: 66.19 %
Brady Statistic AS VS Percent: 0.06 %
Brady Statistic RA Percent Paced: 33.57 %
Brady Statistic RV Percent Paced: 99.93 %
Date Time Interrogation Session: 20221215221817
Implantable Lead Implant Date: 20150212
Implantable Lead Implant Date: 20150212
Implantable Lead Implant Date: 20220318
Implantable Lead Location: 753858
Implantable Lead Location: 753859
Implantable Lead Location: 753860
Implantable Lead Model: 4598
Implantable Lead Model: 5076
Implantable Lead Model: 5076
Implantable Pulse Generator Implant Date: 20220318
Lead Channel Impedance Value: 342 Ohm
Lead Channel Impedance Value: 361 Ohm
Lead Channel Impedance Value: 380 Ohm
Lead Channel Impedance Value: 437 Ohm
Lead Channel Impedance Value: 456 Ohm
Lead Channel Impedance Value: 475 Ohm
Lead Channel Impedance Value: 475 Ohm
Lead Channel Impedance Value: 570 Ohm
Lead Channel Impedance Value: 646 Ohm
Lead Channel Impedance Value: 741 Ohm
Lead Channel Impedance Value: 741 Ohm
Lead Channel Impedance Value: 950 Ohm
Lead Channel Impedance Value: 969 Ohm
Lead Channel Impedance Value: 969 Ohm
Lead Channel Pacing Threshold Amplitude: 0.625 V
Lead Channel Pacing Threshold Amplitude: 1 V
Lead Channel Pacing Threshold Amplitude: 1.125 V
Lead Channel Pacing Threshold Pulse Width: 0.4 ms
Lead Channel Pacing Threshold Pulse Width: 0.4 ms
Lead Channel Pacing Threshold Pulse Width: 0.5 ms
Lead Channel Sensing Intrinsic Amplitude: 17.25 mV
Lead Channel Sensing Intrinsic Amplitude: 17.25 mV
Lead Channel Sensing Intrinsic Amplitude: 4.5 mV
Lead Channel Sensing Intrinsic Amplitude: 4.5 mV
Lead Channel Setting Pacing Amplitude: 1.5 V
Lead Channel Setting Pacing Amplitude: 1.75 V
Lead Channel Setting Pacing Amplitude: 2 V
Lead Channel Setting Pacing Pulse Width: 0.4 ms
Lead Channel Setting Pacing Pulse Width: 0.5 ms
Lead Channel Setting Sensing Sensitivity: 0.9 mV

## 2021-05-20 DIAGNOSIS — C4442 Squamous cell carcinoma of skin of scalp and neck: Secondary | ICD-10-CM | POA: Diagnosis not present

## 2021-05-26 NOTE — Progress Notes (Signed)
Remote pacemaker transmission.   

## 2021-06-03 ENCOUNTER — Other Ambulatory Visit: Payer: Self-pay | Admitting: Internal Medicine

## 2021-06-03 NOTE — Telephone Encounter (Signed)
Please refill as per office routine med refill policy (all routine meds to be refilled for 3 mo or monthly (per pt preference) up to one year from last visit, then month to month grace period for 3 mo, then further med refills will have to be denied) ? ?

## 2021-06-07 ENCOUNTER — Encounter (HOSPITAL_BASED_OUTPATIENT_CLINIC_OR_DEPARTMENT_OTHER): Payer: Self-pay

## 2021-06-07 ENCOUNTER — Emergency Department (HOSPITAL_BASED_OUTPATIENT_CLINIC_OR_DEPARTMENT_OTHER): Payer: Medicare Other

## 2021-06-07 ENCOUNTER — Inpatient Hospital Stay (HOSPITAL_BASED_OUTPATIENT_CLINIC_OR_DEPARTMENT_OTHER)
Admission: EM | Admit: 2021-06-07 | Discharge: 2021-06-10 | DRG: 329 | Disposition: A | Discharge status: Home Health | Payer: Medicare Other | Attending: Internal Medicine | Admitting: Internal Medicine

## 2021-06-07 ENCOUNTER — Other Ambulatory Visit: Payer: Self-pay

## 2021-06-07 ENCOUNTER — Inpatient Hospital Stay (HOSPITAL_COMMUNITY): Payer: Medicare Other

## 2021-06-07 DIAGNOSIS — J8410 Pulmonary fibrosis, unspecified: Secondary | ICD-10-CM | POA: Diagnosis not present

## 2021-06-07 DIAGNOSIS — K612 Anorectal abscess: Principal | ICD-10-CM | POA: Diagnosis present

## 2021-06-07 DIAGNOSIS — I442 Atrioventricular block, complete: Secondary | ICD-10-CM | POA: Diagnosis not present

## 2021-06-07 DIAGNOSIS — I480 Paroxysmal atrial fibrillation: Secondary | ICD-10-CM | POA: Diagnosis present

## 2021-06-07 DIAGNOSIS — I130 Hypertensive heart and chronic kidney disease with heart failure and stage 1 through stage 4 chronic kidney disease, or unspecified chronic kidney disease: Secondary | ICD-10-CM | POA: Diagnosis present

## 2021-06-07 DIAGNOSIS — I509 Heart failure, unspecified: Secondary | ICD-10-CM | POA: Diagnosis not present

## 2021-06-07 DIAGNOSIS — F32A Depression, unspecified: Secondary | ICD-10-CM | POA: Diagnosis present

## 2021-06-07 DIAGNOSIS — K611 Rectal abscess: Secondary | ICD-10-CM

## 2021-06-07 DIAGNOSIS — N183 Chronic kidney disease, stage 3 unspecified: Secondary | ICD-10-CM | POA: Diagnosis present

## 2021-06-07 DIAGNOSIS — F03A3 Unspecified dementia, mild, with mood disturbance: Secondary | ICD-10-CM | POA: Diagnosis not present

## 2021-06-07 DIAGNOSIS — E871 Hypo-osmolality and hyponatremia: Secondary | ICD-10-CM | POA: Diagnosis not present

## 2021-06-07 DIAGNOSIS — Z79899 Other long term (current) drug therapy: Secondary | ICD-10-CM

## 2021-06-07 DIAGNOSIS — I080 Rheumatic disorders of both mitral and aortic valves: Secondary | ICD-10-CM | POA: Diagnosis present

## 2021-06-07 DIAGNOSIS — I10 Essential (primary) hypertension: Secondary | ICD-10-CM | POA: Diagnosis not present

## 2021-06-07 DIAGNOSIS — Z20822 Contact with and (suspected) exposure to covid-19: Secondary | ICD-10-CM | POA: Diagnosis not present

## 2021-06-07 DIAGNOSIS — J189 Pneumonia, unspecified organism: Secondary | ICD-10-CM

## 2021-06-07 DIAGNOSIS — E119 Type 2 diabetes mellitus without complications: Secondary | ICD-10-CM

## 2021-06-07 DIAGNOSIS — Z823 Family history of stroke: Secondary | ICD-10-CM

## 2021-06-07 DIAGNOSIS — R109 Unspecified abdominal pain: Secondary | ICD-10-CM | POA: Diagnosis not present

## 2021-06-07 DIAGNOSIS — K219 Gastro-esophageal reflux disease without esophagitis: Secondary | ICD-10-CM | POA: Diagnosis present

## 2021-06-07 DIAGNOSIS — F0390 Unspecified dementia without behavioral disturbance: Secondary | ICD-10-CM

## 2021-06-07 DIAGNOSIS — E1122 Type 2 diabetes mellitus with diabetic chronic kidney disease: Secondary | ICD-10-CM | POA: Diagnosis present

## 2021-06-07 DIAGNOSIS — I517 Cardiomegaly: Secondary | ICD-10-CM | POA: Diagnosis not present

## 2021-06-07 DIAGNOSIS — K5909 Other constipation: Secondary | ICD-10-CM | POA: Diagnosis present

## 2021-06-07 DIAGNOSIS — I5022 Chronic systolic (congestive) heart failure: Secondary | ICD-10-CM | POA: Diagnosis not present

## 2021-06-07 DIAGNOSIS — Z8249 Family history of ischemic heart disease and other diseases of the circulatory system: Secondary | ICD-10-CM

## 2021-06-07 DIAGNOSIS — N1832 Chronic kidney disease, stage 3b: Secondary | ICD-10-CM | POA: Diagnosis present

## 2021-06-07 DIAGNOSIS — E785 Hyperlipidemia, unspecified: Secondary | ICD-10-CM | POA: Diagnosis present

## 2021-06-07 DIAGNOSIS — R931 Abnormal findings on diagnostic imaging of heart and coronary circulation: Secondary | ICD-10-CM

## 2021-06-07 DIAGNOSIS — E1151 Type 2 diabetes mellitus with diabetic peripheral angiopathy without gangrene: Secondary | ICD-10-CM | POA: Diagnosis not present

## 2021-06-07 DIAGNOSIS — R338 Other retention of urine: Secondary | ICD-10-CM | POA: Diagnosis present

## 2021-06-07 DIAGNOSIS — I3139 Other pericardial effusion (noninflammatory): Secondary | ICD-10-CM | POA: Diagnosis not present

## 2021-06-07 DIAGNOSIS — Z801 Family history of malignant neoplasm of trachea, bronchus and lung: Secondary | ICD-10-CM

## 2021-06-07 DIAGNOSIS — Z7982 Long term (current) use of aspirin: Secondary | ICD-10-CM

## 2021-06-07 DIAGNOSIS — E860 Dehydration: Secondary | ICD-10-CM | POA: Diagnosis present

## 2021-06-07 DIAGNOSIS — I7143 Infrarenal abdominal aortic aneurysm, without rupture: Secondary | ICD-10-CM | POA: Diagnosis present

## 2021-06-07 DIAGNOSIS — Z8719 Personal history of other diseases of the digestive system: Secondary | ICD-10-CM

## 2021-06-07 DIAGNOSIS — Z950 Presence of cardiac pacemaker: Secondary | ICD-10-CM

## 2021-06-07 DIAGNOSIS — J449 Chronic obstructive pulmonary disease, unspecified: Secondary | ICD-10-CM | POA: Diagnosis not present

## 2021-06-07 DIAGNOSIS — K649 Unspecified hemorrhoids: Secondary | ICD-10-CM | POA: Diagnosis present

## 2021-06-07 DIAGNOSIS — I428 Other cardiomyopathies: Secondary | ICD-10-CM | POA: Diagnosis not present

## 2021-06-07 DIAGNOSIS — Z8711 Personal history of peptic ulcer disease: Secondary | ICD-10-CM

## 2021-06-07 DIAGNOSIS — I350 Nonrheumatic aortic (valve) stenosis: Secondary | ICD-10-CM | POA: Diagnosis not present

## 2021-06-07 DIAGNOSIS — I110 Hypertensive heart disease with heart failure: Secondary | ICD-10-CM | POA: Diagnosis not present

## 2021-06-07 DIAGNOSIS — J181 Lobar pneumonia, unspecified organism: Secondary | ICD-10-CM | POA: Diagnosis not present

## 2021-06-07 DIAGNOSIS — I351 Nonrheumatic aortic (valve) insufficiency: Secondary | ICD-10-CM | POA: Diagnosis not present

## 2021-06-07 DIAGNOSIS — I5089 Other heart failure: Secondary | ICD-10-CM | POA: Diagnosis not present

## 2021-06-07 DIAGNOSIS — Z87891 Personal history of nicotine dependence: Secondary | ICD-10-CM

## 2021-06-07 DIAGNOSIS — I342 Nonrheumatic mitral (valve) stenosis: Secondary | ICD-10-CM | POA: Diagnosis not present

## 2021-06-07 DIAGNOSIS — N400 Enlarged prostate without lower urinary tract symptoms: Secondary | ICD-10-CM | POA: Diagnosis present

## 2021-06-07 DIAGNOSIS — I700 Atherosclerosis of aorta: Secondary | ICD-10-CM | POA: Diagnosis not present

## 2021-06-07 DIAGNOSIS — Z888 Allergy status to other drugs, medicaments and biological substances status: Secondary | ICD-10-CM

## 2021-06-07 HISTORY — DX: Calculus of kidney: N20.0

## 2021-06-07 LAB — COMPREHENSIVE METABOLIC PANEL
ALT: 29 U/L (ref 0–44)
AST: 31 U/L (ref 15–41)
Albumin: 3.2 g/dL — ABNORMAL LOW (ref 3.5–5.0)
Alkaline Phosphatase: 81 U/L (ref 38–126)
Anion gap: 13 (ref 5–15)
BUN: 30 mg/dL — ABNORMAL HIGH (ref 8–23)
CO2: 21 mmol/L — ABNORMAL LOW (ref 22–32)
Calcium: 8.6 mg/dL — ABNORMAL LOW (ref 8.9–10.3)
Chloride: 99 mmol/L (ref 98–111)
Creatinine, Ser: 1.6 mg/dL — ABNORMAL HIGH (ref 0.61–1.24)
GFR, Estimated: 41 mL/min — ABNORMAL LOW (ref >60)
Glucose, Bld: 109 mg/dL — ABNORMAL HIGH (ref 70–99)
Potassium: 4.4 mmol/L (ref 3.5–5.1)
Sodium: 133 mmol/L — ABNORMAL LOW (ref 135–145)
Total Bilirubin: 1.2 mg/dL (ref 0.3–1.2)
Total Protein: 7.2 g/dL (ref 6.5–8.1)

## 2021-06-07 LAB — RESP PANEL BY RT-PCR (FLU A&B, COVID) ARPGX2
Influenza A by PCR: NEGATIVE
Influenza B by PCR: NEGATIVE
SARS Coronavirus 2 by RT PCR: NEGATIVE

## 2021-06-07 LAB — CBC WITH DIFFERENTIAL/PLATELET
Abs Immature Granulocytes: 0.04 10*3/uL (ref 0.00–0.07)
Basophils Absolute: 0 10*3/uL (ref 0.0–0.1)
Basophils Relative: 0 %
Eosinophils Absolute: 0 10*3/uL (ref 0.0–0.5)
Eosinophils Relative: 0 %
HCT: 40.4 % (ref 39.0–52.0)
Hemoglobin: 13.4 g/dL (ref 13.0–17.0)
Immature Granulocytes: 0 %
Lymphocytes Relative: 4 %
Lymphs Abs: 0.5 10*3/uL — ABNORMAL LOW (ref 0.7–4.0)
MCH: 28.6 pg (ref 26.0–34.0)
MCHC: 33.2 g/dL (ref 30.0–36.0)
MCV: 86.1 fL (ref 80.0–100.0)
Monocytes Absolute: 1.1 10*3/uL — ABNORMAL HIGH (ref 0.1–1.0)
Monocytes Relative: 10 %
Neutro Abs: 10.1 10*3/uL — ABNORMAL HIGH (ref 1.7–7.7)
Neutrophils Relative %: 86 %
Platelets: 264 10*3/uL (ref 150–400)
RBC: 4.69 MIL/uL (ref 4.22–5.81)
RDW: 13.7 % (ref 11.5–15.5)
WBC: 11.8 10*3/uL — ABNORMAL HIGH (ref 4.0–10.5)
nRBC: 0 % (ref 0.0–0.2)

## 2021-06-07 LAB — URINALYSIS, ROUTINE W REFLEX MICROSCOPIC
Bilirubin Urine: NEGATIVE
Glucose, UA: NEGATIVE mg/dL
Hgb urine dipstick: NEGATIVE
Ketones, ur: 40 mg/dL — AB
Leukocytes,Ua: NEGATIVE
Nitrite: NEGATIVE
Protein, ur: NEGATIVE mg/dL
Specific Gravity, Urine: 1.02 (ref 1.005–1.030)
pH: 5.5 (ref 5.0–8.0)

## 2021-06-07 LAB — GLUCOSE, CAPILLARY: Glucose-Capillary: 99 mg/dL (ref 70–99)

## 2021-06-07 LAB — OCCULT BLOOD X 1 CARD TO LAB, STOOL: Fecal Occult Bld: POSITIVE — AB

## 2021-06-07 MED ORDER — METRONIDAZOLE 500 MG/100ML IV SOLN
500.0000 mg | Freq: Once | INTRAVENOUS | Status: AC
  Administered 2021-06-07: 500 mg via INTRAVENOUS
  Filled 2021-06-07: qty 100

## 2021-06-07 MED ORDER — BUPIVACAINE LIPOSOME 1.3 % IJ SUSP: 20.0000 mL | INTRAMUSCULAR | Status: AC

## 2021-06-07 MED ORDER — SENNOSIDES-DOCUSATE SODIUM 8.6-50 MG PO TABS
1.0000 | ORAL_TABLET | Freq: Every day | ORAL | Status: DC
  Administered 2021-06-08: 1 via ORAL
  Filled 2021-06-07 (×2): qty 1

## 2021-06-07 MED ORDER — METOPROLOL SUCCINATE ER 25 MG PO TB24
12.5000 mg | ORAL_TABLET | Freq: Every day | ORAL | Status: DC
  Administered 2021-06-08 – 2021-06-10 (×3): 12.5 mg via ORAL
  Filled 2021-06-07: qty 0.5
  Filled 2021-06-07 (×2): qty 1

## 2021-06-07 MED ORDER — METHOCARBAMOL 500 MG PO TABS: 500.0000 mg | ORAL_TABLET | Freq: Four times a day (QID) | ORAL | Status: DC | PRN

## 2021-06-07 MED ORDER — METHOCARBAMOL 1000 MG/10ML IJ SOLN
1000.0000 mg | Freq: Four times a day (QID) | INTRAVENOUS | Status: DC | PRN
  Filled 2021-06-07: qty 10

## 2021-06-07 MED ORDER — PROCHLORPERAZINE EDISYLATE 10 MG/2ML IJ SOLN: 5.0000 mg | INTRAMUSCULAR | Status: DC | PRN

## 2021-06-07 MED ORDER — METOPROLOL TARTRATE 5 MG/5ML IV SOLN: 5.0000 mg | Freq: Four times a day (QID) | INTRAVENOUS | Status: DC | PRN

## 2021-06-07 MED ORDER — OXYCODONE HCL 5 MG PO TABS
5.0000 mg | ORAL_TABLET | ORAL | Status: DC | PRN
  Administered 2021-06-07: 5 mg via ORAL
  Filled 2021-06-07: qty 1

## 2021-06-07 MED ORDER — IOHEXOL 300 MG/ML  SOLN
100.0000 mL | Freq: Once | INTRAMUSCULAR | Status: AC | PRN
  Administered 2021-06-07: 80 mL via INTRAVENOUS

## 2021-06-07 MED ORDER — CEFAZOLIN SODIUM-DEXTROSE 2-4 GM/100ML-% IV SOLN
2.0000 g | INTRAVENOUS | Status: AC
  Administered 2021-06-08: 2 g via INTRAVENOUS
  Filled 2021-06-07: qty 100

## 2021-06-07 MED ORDER — SODIUM CHLORIDE 0.9 % IV SOLN
1.0000 g | INTRAVENOUS | Status: DC
  Administered 2021-06-07: 1 g via INTRAVENOUS
  Filled 2021-06-07: qty 10

## 2021-06-07 MED ORDER — ZINC SULFATE 220 (50 ZN) MG PO CAPS
220.0000 mg | ORAL_CAPSULE | Freq: Every day | ORAL | Status: DC
  Administered 2021-06-09 – 2021-06-10 (×2): 220 mg via ORAL
  Filled 2021-06-07 (×2): qty 1

## 2021-06-07 MED ORDER — INSULIN ASPART 100 UNIT/ML IJ SOLN
0.0000 [IU] | INTRAMUSCULAR | Status: DC
  Administered 2021-06-08 – 2021-06-09 (×2): 2 [IU] via SUBCUTANEOUS

## 2021-06-07 MED ORDER — METRONIDAZOLE 500 MG/100ML IV SOLN
500.0000 mg | INTRAVENOUS | Status: AC
  Administered 2021-06-08: 500 mg via INTRAVENOUS
  Filled 2021-06-07: qty 100

## 2021-06-07 MED ORDER — SIMETHICONE 40 MG/0.6ML PO SUSP
80.0000 mg | Freq: Four times a day (QID) | ORAL | Status: DC | PRN
  Filled 2021-06-07: qty 1.2

## 2021-06-07 MED ORDER — METHOCARBAMOL 1000 MG/10ML IJ SOLN
500.0000 mg | Freq: Four times a day (QID) | INTRAVENOUS | Status: DC | PRN
  Filled 2021-06-07: qty 5

## 2021-06-07 MED ORDER — ACETAMINOPHEN 500 MG PO TABS
1000.0000 mg | ORAL_TABLET | Freq: Four times a day (QID) | ORAL | Status: DC
  Administered 2021-06-07 – 2021-06-10 (×8): 1000 mg via ORAL
  Filled 2021-06-07 (×9): qty 2

## 2021-06-07 MED ORDER — TRAMADOL HCL 50 MG PO TABS: 50.0000 mg | ORAL_TABLET | Freq: Four times a day (QID) | ORAL | Status: DC | PRN

## 2021-06-07 MED ORDER — VANCOMYCIN HCL 1500 MG/300ML IV SOLN
1500.0000 mg | Freq: Once | INTRAVENOUS | Status: AC
  Administered 2021-06-08: 1500 mg via INTRAVENOUS
  Filled 2021-06-07: qty 300

## 2021-06-07 MED ORDER — DIPHENHYDRAMINE HCL 50 MG/ML IJ SOLN: 12.5000 mg | Freq: Four times a day (QID) | INTRAMUSCULAR | Status: DC | PRN

## 2021-06-07 MED ORDER — MENTHOL 3 MG MT LOZG: 1.0000 | LOZENGE | OROMUCOSAL | Status: DC | PRN

## 2021-06-07 MED ORDER — HYDRALAZINE HCL 20 MG/ML IJ SOLN
5.0000 mg | INTRAMUSCULAR | Status: DC | PRN
  Filled 2021-06-07: qty 1

## 2021-06-07 MED ORDER — SODIUM CHLORIDE 0.9 % IV SOLN
500.0000 mg | Freq: Once | INTRAVENOUS | Status: AC
  Administered 2021-06-07: 500 mg via INTRAVENOUS
  Filled 2021-06-07: qty 5

## 2021-06-07 MED ORDER — MORPHINE SULFATE (PF) 2 MG/ML IV SOLN
2.0000 mg | INTRAVENOUS | Status: DC | PRN
  Administered 2021-06-07: 2 mg via INTRAVENOUS
  Filled 2021-06-07: qty 1

## 2021-06-07 MED ORDER — LINACLOTIDE 72 MCG PO CAPS
72.0000 ug | ORAL_CAPSULE | Freq: Every day | ORAL | Status: DC
  Administered 2021-06-09 – 2021-06-10 (×2): 72 ug via ORAL
  Filled 2021-06-07 (×2): qty 1

## 2021-06-07 MED ORDER — PIPERACILLIN-TAZOBACTAM 3.375 G IVPB
3.3750 g | Freq: Three times a day (TID) | INTRAVENOUS | Status: DC
  Administered 2021-06-07 – 2021-06-10 (×8): 3.375 g via INTRAVENOUS
  Filled 2021-06-07 (×8): qty 50

## 2021-06-07 MED ORDER — PHENOL 1.4 % MT LIQD: 2.0000 | OROMUCOSAL | Status: DC | PRN

## 2021-06-07 MED ORDER — HYDROMORPHONE HCL 1 MG/ML IJ SOLN: 0.5000 mg | INTRAMUSCULAR | Status: DC | PRN

## 2021-06-07 MED ORDER — ASPIRIN 81 MG PO CHEW
81.0000 mg | CHEWABLE_TABLET | Freq: Every day | ORAL | Status: DC
  Administered 2021-06-09 – 2021-06-10 (×2): 81 mg via ORAL
  Filled 2021-06-07 (×2): qty 1

## 2021-06-07 MED ORDER — PANTOPRAZOLE SODIUM 40 MG PO TBEC
40.0000 mg | DELAYED_RELEASE_TABLET | Freq: Every day | ORAL | Status: DC
  Administered 2021-06-09 – 2021-06-10 (×2): 40 mg via ORAL
  Filled 2021-06-07 (×2): qty 1

## 2021-06-07 MED ORDER — ENSURE PRE-SURGERY PO LIQD
296.0000 mL | Freq: Once | ORAL | Status: AC
  Administered 2021-06-08: 355 mL via ORAL
  Filled 2021-06-07: qty 296

## 2021-06-07 MED ORDER — ACETAMINOPHEN 325 MG PO TABS: 650.0000 mg | ORAL_TABLET | Freq: Four times a day (QID) | ORAL | Status: DC | PRN

## 2021-06-07 MED ORDER — POLYETHYLENE GLYCOL 3350 17 G PO PACK
17.0000 g | PACK | Freq: Every day | ORAL | Status: DC
  Filled 2021-06-07 (×2): qty 1

## 2021-06-07 MED ORDER — SODIUM CHLORIDE 0.9 % IV SOLN
8.0000 mg | Freq: Four times a day (QID) | INTRAVENOUS | Status: DC | PRN
  Filled 2021-06-07: qty 4

## 2021-06-07 MED ORDER — LACTATED RINGERS IV SOLN: INTRAVENOUS | Status: DC

## 2021-06-07 MED ORDER — POLYETHYLENE GLYCOL 3350 17 G PO PACK: 17.0000 g | PACK | Freq: Two times a day (BID) | ORAL | Status: DC | PRN

## 2021-06-07 MED ORDER — MAGNESIUM 200 MG PO TABS: 200.0000 mg | ORAL_TABLET | Freq: Two times a day (BID) | ORAL | Status: DC

## 2021-06-07 MED ORDER — GABAPENTIN 300 MG PO CAPS
300.0000 mg | ORAL_CAPSULE | ORAL | Status: AC
  Administered 2021-06-08: 300 mg via ORAL
  Filled 2021-06-07: qty 1

## 2021-06-07 MED ORDER — CHLORHEXIDINE GLUCONATE CLOTH 2 % EX PADS: 6.0000 | MEDICATED_PAD | Freq: Once | CUTANEOUS | Status: DC

## 2021-06-07 MED ORDER — ACETAMINOPHEN 500 MG PO TABS
1000.0000 mg | ORAL_TABLET | ORAL | Status: AC
  Administered 2021-06-08: 1000 mg via ORAL
  Filled 2021-06-07: qty 2

## 2021-06-07 MED ORDER — MAGIC MOUTHWASH
15.0000 mL | Freq: Four times a day (QID) | ORAL | Status: DC | PRN
  Filled 2021-06-07: qty 15

## 2021-06-07 MED ORDER — METHOCARBAMOL 500 MG PO TABS: 1000.0000 mg | ORAL_TABLET | Freq: Four times a day (QID) | ORAL | Status: DC | PRN

## 2021-06-07 MED ORDER — ALUM & MAG HYDROXIDE-SIMETH 200-200-20 MG/5ML PO SUSP: 30.0000 mL | Freq: Four times a day (QID) | ORAL | Status: DC | PRN

## 2021-06-07 MED ORDER — DOCUSATE SODIUM 100 MG PO CAPS
100.0000 mg | ORAL_CAPSULE | Freq: Two times a day (BID) | ORAL | Status: DC
  Administered 2021-06-07 – 2021-06-10 (×4): 100 mg via ORAL
  Filled 2021-06-07 (×5): qty 1

## 2021-06-07 MED ORDER — IPRATROPIUM-ALBUTEROL 0.5-2.5 (3) MG/3ML IN SOLN: 3.0000 mL | Freq: Four times a day (QID) | RESPIRATORY_TRACT | Status: DC | PRN

## 2021-06-07 MED ORDER — CHLORHEXIDINE GLUCONATE CLOTH 2 % EX PADS
6.0000 | MEDICATED_PAD | Freq: Once | CUTANEOUS | Status: AC
  Administered 2021-06-08: 6 via TOPICAL

## 2021-06-07 MED ORDER — ONDANSETRON HCL 4 MG/2ML IJ SOLN: 4.0000 mg | Freq: Four times a day (QID) | INTRAMUSCULAR | Status: DC | PRN

## 2021-06-07 MED ORDER — LIP MEDEX EX OINT
1.0000 "application " | TOPICAL_OINTMENT | Freq: Two times a day (BID) | CUTANEOUS | Status: DC
  Administered 2021-06-07 – 2021-06-10 (×6): 1 via TOPICAL
  Filled 2021-06-07 (×2): qty 7

## 2021-06-07 NOTE — ED Notes (Signed)
Report given to [facility] 

## 2021-06-07 NOTE — H&P (Signed)
Admission Note  Daniel Woodard Aug 08, 1929  [id number].    Requesting MD: Malvin Johns, MD Chief Complaint/Reason for Consult: perirectal abscess HPI:  Patient is an 88 year old male with PMHx significant for atrial fibrillation, complete heart block, carotid artery occlusion, COPD, HTN, HLD, T2DM, and peripheral vascular disease who presented to [facility] with intermittent rectal pain x 4 months and constipation x4 days. Patient is somewhat confused so history is somewhat limited. Not on anticoagulation. Prior abdominal surgery includes appendectomy and gastric surgery for ulcer disease. He has also had hemorrhoid banding in the past. Allergic to ACE inhibitors.   ROS: Review of Systems  All other systems reviewed and are negative.  Family History  Problem Relation Age of Onset   Hypertension Father    Ulcers Father    Hypertension Mother    Hypertension Sister    Peripheral vascular disease Brother    Hypertension Sister    Ulcers Sister        Ulcers   Hypertension Sister    Lung cancer Sister        smoked   Heart disease Sister        Carotid    Hypertension Sister    Heart disease Other        Cardiovascular disorder, CHF   Stroke Other        1st degree relative male and male   Colon cancer Neg Hx     Past Medical History:  Diagnosis Date   ALLERGIC RHINITIS 04/09/2007   Atrial fibrillation ([place])    BACTEREMIA, 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   BENIGN PROSTATIC HYPERTROPHY 04/09/2007   Blood transfusion without reported diagnosis    really not sure thinks 30-40 years ago   BRADYCARDIA 05/11/2009   Carotid artery occlusion    COLONIC POLYPS, HX OF 04/09/2007   ADENOMATOUS POLYPS 2004,2008   Complete heart block (HCC)    COPD 04/09/2007   Cough 03/27/2008   DEPRESSION 04/09/2007   DIABETES MELLITUS, TYPE II 10/09/2007   DIVERTICULOSIS, COLON 04/09/2007   DIZZINESS 10/06/2008   GLUCOSE INTOLERANCE 04/09/2007   HYPERLIPIDEMIA 03/27/2007    HYPERTENSION 03/27/2007   Kidney stone    PERIPHERAL VASCULAR DISEASE 03/27/2007   PNEUMONIA ORGANISM NOS 11/08/2007   SHINGLES, HX OF 04/09/2007   SHOULDER PAIN, BILATERAL 08/01/2007   Ulcer    40 years ago    Past Surgical History:  Procedure Laterality Date   APPENDECTOMY     88 years old   BIV UPGRADE N/A 08/14/2020   Procedure: BIV PPM UPGRADE;  Surgeon: Evans Lance, MD;  Location: [facility];  Service: Cardiovascular;  Laterality: N/A;   CAROTID ENDARTERECTOMY  5/08   left   CARPAL TUNNEL RELEASE     bilateral   COLONOSCOPY     ESOPHAGOGASTRODUODENOSCOPY  06/07/2004   HEMORRHOID BANDING  1990s   LEFT HEART CATH AND CORONARY ANGIOGRAPHY N/A 06/02/2020   Procedure: LEFT HEART CATH AND CORONARY ANGIOGRAPHY;  Surgeon: Jettie Booze, MD;  Location: [facility];  Service: Cardiovascular;  Laterality: N/A;   PACEMAKER INSERTION  07/11/2013   MDT ADDRL1 pacemaker implanted by Dr Lovena Le for complete heart block   PERMANENT PACEMAKER INSERTION N/A 07/11/2013   Procedure: PERMANENT PACEMAKER INSERTION;  Surgeon: Evans Lance, MD;  Location: [facility] C[facility];  Service: Cardiovascular;  Laterality: N/A;   s/p PUD surgury     ? partial gastrectomy    Social History:  reports that  he quit smoking about 21 years ago. His smoking use included cigarettes. He has a 50.00 pack-year smoking history. He has never used smokeless tobacco. He reports that he does not drink alcohol and does not use drugs.  Allergies:  Allergies  Allergen Reactions   Ace Inhibitors Cough         (Not in a hospital admission)   Blood pressure (!) 156/64, pulse 86, temperature (!) 97 F (36.1 C), resp. rate 18, height 6' (1.829 m), weight 76.7 kg, SpO2 95 %. Physical Exam:  General: pleasant, WD,  male who is laying in bed in NAD HEENT: head is normocephalic, atraumatic.  Sclera are noninjected.  PERRL.  Ears and nose without any masses or lesions.  Mouth is pink and moist Heart: regular,  rate, and rhythm.  Normal s1,s2. No obvious murmurs, gallops, or rubs noted.  Palpable radial and pedal pulses bilaterally Lungs: CTAB, no wheezes, rhonchi, or rales noted.  Respiratory effort nonlabored Abd: soft, NT, ND, +BS, no masses, hernias, or organomegaly MS: all 4 extremities are symmetrical with no cyanosis, clubbing, or edema. Skin: warm and dry with no masses, lesions, or rashes Neuro: Cranial nerves 2-12 grossly intact, sensation is normal throughout Psych: A&Ox3 with an appropriate affect.   Results for orders placed or performed during the hospital encounter of 06/07/21 (from the past 48 hour(s))  Urinalysis, Routine w reflex microscopic Urine, Clean Catch     Status: Abnormal   Collection Time: 06/07/21 11:00 AM  Result Value Ref Range   Color, Urine YELLOW YELLOW   APPearance CLEAR CLEAR   Specific Gravity, Urine 1.020 1.005 - 1.030   pH 5.5 5.0 - 8.0   Glucose, UA NEGATIVE NEGATIVE mg/dL   Hgb urine dipstick NEGATIVE NEGATIVE   Bilirubin Urine NEGATIVE NEGATIVE   Ketones, ur 40 (A) NEGATIVE mg/dL   Protein, ur NEGATIVE NEGATIVE mg/dL   Nitrite NEGATIVE NEGATIVE   Leukocytes,Ua NEGATIVE NEGATIVE    Comment: Microscopic not done on urines with negative protein, blood, leukocytes, nitrite, or glucose < 500 mg/dL. Performed at [facility], [address] [place], [place] [address]   Comprehensive metabolic panel     Status: Abnormal   Collection Time: 06/07/21 11:22 AM  Result Value Ref Range   Sodium 133 (L) 135 - 145 mmol/L   Potassium 4.4 3.5 - 5.1 mmol/L   Chloride 99 98 - 111 mmol/L   CO2 21 (L) 22 - 32 mmol/L   Glucose, Bld 109 (H) 70 - 99 mg/dL    Comment: Glucose reference range applies only to samples taken after fasting for at least 8 hours.   BUN 30 (H) 8 - 23 mg/dL   Creatinine, Ser 1.60 (H) 0.61 - 1.24 mg/dL   Calcium 8.6 (L) 8.9 - 10.3 mg/dL   Total Protein 7.2 6.5 - 8.1 g/dL   Albumin 3.2 (L) 3.5 - 5.0 g/dL   AST 31 15 - 41 U/L   ALT  29 0 - 44 U/L   Alkaline Phosphatase 81 38 - 126 U/L   Total Bilirubin 1.2 0.3 - 1.2 mg/dL   GFR, Estimated 41 (L) >60 mL/min    Comment: (NOTE) Calculated using the CKD-EPI Creatinine Equation (2021)    Anion gap 13 5 - 15    Comment: Performed at [facility], [address] [place], [place] [address]  CBC with Differential     Status: Abnormal   Collection Time: 06/07/21 11:22 AM  Result Value  Ref Range   WBC 11.8 (H) 4.0 - 10.5 K/uL   RBC 4.69 4.22 - 5.81 MIL/uL   Hemoglobin 13.4 13.0 - 17.0 g/dL   HCT 40.4 39.0 - 52.0 %   MCV 86.1 80.0 - 100.0 fL   MCH 28.6 26.0 - 34.0 pg   MCHC 33.2 30.0 - 36.0 g/dL   RDW 13.7 11.5 - 15.5 %   Platelets 264 150 - 400 K/uL   nRBC 0.0 0.0 - 0.2 %   Neutrophils Relative % 86 %   Neutro Abs 10.1 (H) 1.7 - 7.7 K/uL   Lymphocytes Relative 4 %   Lymphs Abs 0.5 (L) 0.7 - 4.0 K/uL   Monocytes Relative 10 %   Monocytes Absolute 1.1 (H) 0.1 - 1.0 K/uL   Eosinophils Relative 0 %   Eosinophils Absolute 0.0 0.0 - 0.5 K/uL   Basophils Relative 0 %   Basophils Absolute 0.0 0.0 - 0.1 K/uL   Immature Granulocytes 0 %   Abs Immature Granulocytes 0.04 0.00 - 0.07 K/uL    Comment: Performed at [facility], [address] [place], [place] [address]  Occult blood card to lab, stool Provider will collect     Status: Abnormal   Collection Time: 06/07/21 11:58 AM  Result Value Ref Range   Fecal Occult Bld POSITIVE (A) NEGATIVE    Comment: Performed at [facility], [address] [place], [place] [address]  Resp Panel by RT-PCR (Flu A&B, Covid) Nasopharyngeal Swab     Status: None   Collection Time: 06/07/21  1:18 PM   Specimen: Nasopharyngeal Swab; Nasopharyngeal(NP) swabs in vial transport medium  Result Value Ref Range   SARS Coronavirus 2 by RT PCR NEGATIVE NEGATIVE    Comment: (NOTE) SARS-CoV-2 target nucleic acids are NOT DETECTED.  The SARS-CoV-2 RNA is generally detectable in upper respiratory specimens during  the acute phase of infection. The lowest concentration of SARS-CoV-2 viral copies this assay can detect is 138 copies/mL. A negative result does not preclude SARS-Cov-2 infection and should not be used as the sole basis for treatment or other patient management decisions. A negative result may occur with  improper specimen collection/handling, submission of specimen other than nasopharyngeal swab, presence of viral mutation(s) within the areas targeted by this assay, and inadequate number of viral copies(<138 copies/mL). A negative result must be combined with clinical observations, patient history, and epidemiological information. The expected result is Negative.  Fact Sheet for Patients:  EntrepreneurPulse.com.au  Fact Sheet for Healthcare Providers:  IncredibleEmployment.be  This test is no t yet approved or cleared by the [place] FDA and  has been authorized for detection and/or diagnosis of SARS-CoV-2 by FDA under an Emergency Use Authorization (EUA). This EUA will remain  in effect (meaning this test can be used) for the duration of the COVID-19 declaration under Section 564(b)(1) of the Act, 21 U.S.C.section 360bbb-3(b)(1), unless the authorization is terminated  or revoked sooner.       Influenza A by PCR NEGATIVE NEGATIVE   Influenza B by PCR NEGATIVE NEGATIVE    Comment: (NOTE) The Xpert Xpress SARS-CoV-2/FLU/RSV plus assay is intended as an aid in the diagnosis of influenza from Nasopharyngeal swab specimens and should not be used as a sole basis for treatment. Nasal washings and aspirates are unacceptable for Xpert Xpress SARS-CoV-2/FLU/RSV testing.  Fact Sheet for Patients: EntrepreneurPulse.com.au  Fact Sheet for Healthcare Providers: IncredibleEmployment.be  This test is not yet approved or cleared by the  [place] Architectural technologist and has been authorized for detection and/or diagnosis of  SARS-CoV-2 by FDA under an Print production planner (EUA). This EUA will remain in effect (meaning this test can be used) for the duration of the COVID-19 declaration under Section 564(b)(1) of the Act, 21 U.S.C. section 360bbb-3(b)(1), unless the authorization is terminated or revoked.  Performed at [facility], [address] [place], [place] [address]    CT Abdomen Pelvis W Contrast  Result Date: 06/07/2021 CLINICAL DATA:  Pelvic pain, rectal pain EXAM: CT ABDOMEN AND PELVIS WITH CONTRAST TECHNIQUE: Multidetector CT imaging of the abdomen and pelvis was performed using the standard protocol following bolus administration of intravenous contrast. CONTRAST:  53m OMNIPAQUE IOHEXOL 300 MG/ML  SOLN COMPARISON:  11/04/2020 FINDINGS: Lower chest: New small patchy alveolar infiltrates are seen in both lower lobes. There is minimal right pleural effusion. Small pericardial effusion is seen. Pacemaker/defibrillator leads are noted in the heart. There are surgical clips close to the gastroesophageal junction. Hepatobiliary: There is fatty infiltration. There are few small low-density foci in the liver largest measuring 8 mm with no significant interval change. Gallbladder is unremarkable. Pancreas: No focal abnormality is seen Spleen: Unremarkable. 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 There is no hydronephrosis. There are no renal or ureteral stones. Urinary bladder is distended. There is 11 mm smooth marginated low-density structure in the lower pole of right kidney suggesting renal cyst. There is possible 3 mm cyst in the medial midportion of left kidney. Stomach/Bowel: Small hiatal hernia is seen. Small bowel loops are not dilated. Appendix is not seen. There is no pericecal inflammation. No significant wall thickening is seen in colon. Multiple diverticula are seen in colon without signs of focal acute diverticulitis. There is a large  multiloculated fluid collection with thick wall in the perirectal and perianal region measuring approximately 6.2 x 5.6 by 5.2 cm. There are no pockets of air in the fluid collection. Presacral soft tissues are unremarkable. Vascular/Lymphatic: Atherosclerotic plaques and calcifications are seen in the aorta and its major branches. There is 3.2 cm aneurysm in the infrarenal aorta. No significant lymphadenopathy seen. Reproductive: Unremarkable. Other: There is no ascites or pneumoperitoneum. Musculoskeletal: Degenerative changes are noted in the lumbar spine with spinal stenosis and encroachment of neural foramina at multiple levels. IMPRESSION: There is interval appearance of large lobulated thick-walled fluid collection around the lower rectum and anus along the lateral and posterior walls measuring 6.2 cm in maximum diameter suggesting perirectal/perianal abscess. Surgical consultation should be considered. There are new patchy infiltrates in both lower lobes suggesting multifocal pneumonia or aspiration. There is no evidence of intestinal obstruction or pneumoperitoneum. There is no hydronephrosis. Diverticulosis of colon without signs of focal acute diverticulitis. There is 3.2 cm infrarenal aortic aneurysm. Small hiatal hernia. Small pericardial effusion. Possible hepatic and renal cysts. Electronically Signed   By: PElmer PickerM.D.   On: 06/07/2021 12:46      Assessment/Plan  This very elderly gentleman has numerous medical problems including having a pacemaker for complete heart block, documented decreased ejection fraction, atrial fibrillation, chronic kidney disease with elevated creatinine, COPD, memory loss, etc.  I think this patient would be better managed on the medicine service with surgery consulting.  Certainly surgery will follow to drain the abscess with his risk of aspiration is decreased given solid meal and follow closely.  I suspect he will need to stay beyond the usual 24 hours  for this  given his multiple medical problems.  There is concern of pneumonia on his CT of his chest and pelvis.  I ordered a chest x-ray to see if this is old scarring or something new.  He was placed on Zithromax and I believe medicine has been on antibiotics for now.  IV piperacillin/tazobactam antibiotics since this looks like a significant abscess.  Given his advanced age and fragile state with multiple medical problems, recheck MRSA.  It was negative many years ago.  If positive, consider vancomycin.  He has a very large bladder on his CAT scan and he is only having minimal urination.  I worry about urinary retention given the large abscess limiting his ability to already not defecate and probably has overflow urinary incontinence with a enlarged bladder.  I had ordered a Foley.  Discussed with the nurse about placing it as well.  See if that can provide some relief and avoid him having worsening kidney failure, especially with his baseline elevated creatinine 1.6-2 and BPH.     Diabetes.  On sliding scale.  Last hemoglobin A1c 6.3.  Does not seem particularly hyperglycemic at this time.  Defer to medicine   -VTE prophylaxis- SCDs, etc -mobilize as tolerated to help recovery  [facility] 06/07/2021, 3:38 PM Please see Amion for pager number during day hours 7:00am-4:30pm

## 2021-06-07 NOTE — Consult Note (Addendum)
Daniel Woodard  30-Dec-1929 [id number]  CARE TEAM:  PCP: Biagio Borg, MD  Outpatient Care Team: Patient Care Team: Biagio Borg, MD as PCP - General Croitoru, Dani Gobble, MD as PCP - Cardiology (Cardiology) Evans Lance, MD as PCP - Electrophysiology (Cardiology) Evans Lance, MD as Consulting Physician (Cardiology) Tanda Rockers, MD as Consulting Physician (Pulmonary Disease) Antionette Fairy Isaias Cowman, MD as Referring Physician (Ophthalmology)  Inpatient Treatment Team: Treatment Team: Attending Provider: Joycelyn Das, MD; Rounding Team: Jackelyn Knife, MD; Rounding Team: Edison Pace, Md, MD; Consulting Physician: Edison Pace, Md, MD; Attending Physician: Jackelyn Knife, MD; Consulting Physician: Shela Leff, MD   This patient is a 88 y.o.male who presents today for surgical evaluation at the request of [facility] ED. Sponseller, Gypsy Balsam, PA-C   Chief complaint / Reason for evaluation: Perirectal abscess.  Patient is an 88 year old male with PMHx significant for atrial fibrillation on aspirin only, complete heart block, congestive heart failure with decreased ejection fraction 30-35%, memory loss with early dementia, chronic kidney disease with creatinine 1.6-2.  Carotid artery occlusion, COPD, HTN, HLD, T2DM, and peripheral vascular disease who presented to [facility] with intermittent rectal pain x 4 months and constipation x4 days.  He is followed by Dr. Crissie Sickles with [facility] health cardiology.  Also followed by Dr. Ward Givens with pulmonary for COPD relatively stable.  Patient is somewhat confused so history is somewhat limited.  Initial history provided by daughter at the emergency department who is not here right now.  Not on anticoagulation. Prior abdominal surgery includes appendectomy and gastric surgery for ulcer disease. He has also had hemorrhoid banding in the past. Allergic to ACE inhibitors.  Because of worsening pain he was brought to the emergency department.  Work-up  concerning for perirectal abscess and possible pneumonia.  Surgery consulted.  We recommended surgical consultation with medical admission, but the hospitalist service declined admission.  Patient transferred to [facility].  Apparently patient fed solid food this evening.  Assessment  Daniel Woodard  88 y.o. male     Procedure(s): INCISION AND DRAINAGE ABSCESS  Problem List:  Principal Problem:   Perirectal abscess Active Problems:   Biventricular cardiac pacemaker in situ   CKD (chronic kidney disease), stage III (HCC)   Diabetes ([place])   Hyperlipidemia   Depression   Essential hypertension   COPD GOLD II / AB component    Benign prostatic hyperplasia   CHF (congestive heart failure) (HCC)   Constipation, chronic   Dementia (HCC)   Decreased cardiac ejection fraction   Horseshoe type perirectal abscess.  Numerous medical problems  Plan:  -IV antibiotics.  Medical and possibly cardiac clearance.  Looks like echocardiogram, BNP, other lab work pending.  Anorectal examination under anesthesia with incision and drainage.  Although exam and CAT scan are concerning, he is not toxic at this moment.  Since he just ate, will delay surgery until the morning to decrease aspiration risk.  I would not want to wait beyond 24 hours even though it sounds like this has been going on for many days.  Updated the operating room that he has a pacemaker and anesthesia may need to help evaluate the patient in the morning to help troubleshoot any concerns.  Surgery itself should not be high risk but he is not in the best of shape.  The anatomy and physiology of the region was discussed. The pathophysiology of subcutaneous abscess formation with progression to fasciitis & sepsis was discussed.  Need for incision, drainage, debridement discussed.  I stressed good hygiene & need for repeated wound care.  Possible redebridement / reoperation was discussed as well. Possibility of recurrence was  discussed.   Risks of bleeding, infection, abscess, leak, injury to other organs, need for repair of tissues / organs, need for further treatment, heart attack, death, and other risks were discussed.  Benefits, alternatives were discussed. I noted a good likelihood this will help address the problem.  Questions answered.  The patient agrees to proceed.  This very elderly gentleman has numerous medical problems including having a pacemaker for complete heart block, documented decreased ejection fraction, atrial fibrillation, chronic kidney disease with elevated creatinine, COPD, memory loss, etc.  I think this patient would be better managed on the medicine service with surgery consulting.  Certainly surgery will follow to drain the abscess with his risk of aspiration is decreased given solid meal and follow closely.  I suspect he will need to stay beyond the usual 24 hours for this given his multiple medical problems.  There is concern of pneumonia on his CT of his chest and pelvis.  I ordered a chest x-ray to see if this is old scarring or something new.  He was placed on Zithromax and I believe medicine has been on antibiotics for now.  IV piperacillin/tazobactam antibiotics since this looks like a significant abscess.  Given his advanced age and fragile state with multiple medical problems, recheck MRSA.  It was negative many years ago.  If positive, consider vancomycin.  He has a very large bladder on his CAT scan and he is only having minimal urination.  I worry about urinary retention given the large abscess limiting his ability to already not defecate and probably has overflow urinary incontinence with a enlarged bladder.  I had ordered a Foley.  Discussed with the nurse about placing it as well.  See if that can provide some relief and avoid him having worsening kidney failure, especially with his baseline elevated creatinine 1.6-2 and BPH.    Diabetes.  On sliding scale.  Last hemoglobin A1c  6.3.  Does not seem particularly hyperglycemic at this time.  Defer to medicine  -VTE prophylaxis- SCDs, etc -mobilize as tolerated to help recovery  60 minutes spent in review, evaluation, examination, counseling, and coordination of care.  More than 50% of that time was spent in counseling.  Adin Hector, MD, FACS, MASCRS Esophageal, Gastrointestinal & Colorectal Surgery Robotic and Minimally Invasive Surgery  [facility], [place]  [place]. 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   BENIGN PROSTATIC HYPERTROPHY 04/09/2007   Blood transfusion without reported diagnosis    really not sure thinks 30-40 years ago   BRADYCARDIA 05/11/2009   Carotid artery occlusion    COLONIC POLYPS, HX OF 04/09/2007   ADENOMATOUS POLYPS 2004,2008   Complete heart block ([place])    COPD 04/09/2007   Cough 03/27/2008   DEPRESSION 04/09/2007  DIABETES MELLITUS, TYPE II 10/09/2007   DIVERTICULOSIS, COLON 04/09/2007   DIZZINESS 10/06/2008   GLUCOSE INTOLERANCE 04/09/2007   HYPERLIPIDEMIA 03/27/2007   HYPERTENSION 03/27/2007   Kidney stone    PERIPHERAL VASCULAR DISEASE 03/27/2007   PNEUMONIA ORGANISM NOS 11/08/2007   SHINGLES, HX OF 04/09/2007   SHOULDER PAIN, BILATERAL 08/01/2007   Ulcer    40 years ago    Past Surgical History:  Procedure Laterality Date   APPENDECTOMY     88 years old   BIV UPGRADE N/A  08/14/2020   Procedure: BIV PPM UPGRADE;  Surgeon: Evans Lance, MD;  Location: [facility];  Service: Cardiovascular;  Laterality: N/A;   CAROTID ENDARTERECTOMY  5/08   left   CARPAL TUNNEL RELEASE     bilateral   COLONOSCOPY     ESOPHAGOGASTRODUODENOSCOPY  06/07/2004   HEMORRHOID BANDING  1990s   LEFT HEART CATH AND CORONARY ANGIOGRAPHY N/A 06/02/2020   Procedure: LEFT HEART CATH AND CORONARY ANGIOGRAPHY;  Surgeon: Jettie Booze, MD;  Location: [facility];  Service: Cardiovascular;  Laterality: N/A;   PACEMAKER INSERTION  07/11/2013   MDT ADDRL1 pacemaker implanted by Dr Lovena Le for complete heart block   PERMANENT PACEMAKER INSERTION N/A 07/11/2013   Procedure: PERMANENT PACEMAKER INSERTION;  Surgeon: Evans Lance, MD;  Location: [facility] CATH LAB;  Service: Cardiovascular;  Laterality: N/A;   s/p PUD surgury     ? 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  Tobacco Use   Smoking status: Former    Packs/day: 1.00    Years: 50.00    Pack years: 50.00    Types: Cigarettes    Quit date: 08/20/1999    Years since quitting: 21.8   Smokeless tobacco: Never  Vaping Use   Vaping Use: Never used  Substance and Sexual Activity   Alcohol use: No    Alcohol/week: 0.0 standard drinks   Drug use: No   Sexual activity: Never  Other Topics Concern   Not on file  Social History Narrative   Widowed; wife dies 09/06/2018   Daily caffeine    Social Determinants of Health   Financial Resource Strain: Low Risk    Difficulty of Paying Living Expenses: Not hard at all  Food Insecurity: No Food Insecurity   Worried About Charity fundraiser in the Last Year: Never true   [place] in the Last Year:  Never true  Transportation Needs: No Transportation Needs   Lack of Transportation (Medical): No   Lack of Transportation (Non-Medical): No  Physical Activity: Insufficiently Active   Days of Exercise per Week: 3 days   Minutes of Exercise per Session: 10 min  Stress: No Stress Concern Present   Feeling of Stress : Not at all  Social Connections: Socially Isolated   Frequency of Communication with Friends and Family: More than three times a week   Frequency of Social Gatherings with Friends and Family: Twice a week   Attends Religious Services: Never   Marine scientist or Organizations: Not on file   Attends Archivist Meetings: Never   Marital Status: Widowed  Intimate Partner Violence: Not At  Risk   Fear of Current or Ex-Partner: No   Emotionally Abused: No   Physically Abused: No   Sexually Abused: No    Family History  Problem Relation Age of Onset   Hypertension Father    Ulcers Father    Hypertension Mother    Hypertension Sister    Peripheral vascular disease Brother    Hypertension Sister    Ulcers Sister        Ulcers   Hypertension Sister    Lung cancer Sister        smoked   Heart disease Sister        Carotid    Hypertension Sister    Heart disease Other        Cardiovascular disorder, CHF   Stroke Other        1st degree relative male and male   Colon cancer Neg Hx     Current Facility-Administered Medications  Medication Dose Route Frequency Provider Last Rate Last Admin   [START ON 06/08/2021] acetaminophen (TYLENOL) tablet 1,000 mg  1,000 mg Oral On Call to OR Michael Boston, MD       [START ON 06/08/2021] acetaminophen (TYLENOL) tablet 1,000 mg  1,000 mg Oral Q6H Michael Boston, MD       alum & mag hydroxide-simeth (MAALOX/MYLANTA) 200-200-20 MG/5ML suspension 30 mL  30 mL Oral Q6H PRN Michael Boston, MD       aspirin chewable tablet 81 mg  81 mg Oral Daily Jt Brabec, Remo Lipps, MD       bupivacaine liposome (EXPAREL) 1.3 % injection 266 mg   20 mL Infiltration Once Michael Boston, MD       Derrill Memo ON 06/08/2021] ceFAZolin (ANCEF) IVPB 2g/100 mL premix  2 g Intravenous On Call to OR Michael Boston, MD       And   Derrill Memo ON 06/08/2021] metroNIDAZOLE (FLAGYL) IVPB 500 mg  500 mg Intravenous On Call to OR Michael Boston, MD       Chlorhexidine Gluconate Cloth 2 % PADS 6 each  6 each Topical Once Michael Boston, MD       And   Chlorhexidine Gluconate Cloth 2 % PADS 6 each  6 each Topical Once Michael Boston, MD       diphenhydrAMINE (BENADRYL) injection 12.5-25 mg  12.5-25 mg Intravenous Q6H PRN Michael Boston, MD       docusate sodium (COLACE) capsule 100 mg  100 mg Oral BID Norm Parcel, PA-C   100 mg at 06/07/21 2137   [START ON 06/08/2021] feeding supplement (ENSURE PRE-SURGERY) liquid 296 mL  296 mL Oral Once Michael Boston, MD       Derrill Memo ON 06/08/2021] gabapentin (NEURONTIN) capsule 300 mg  300 mg Oral On Call to OR Michael Boston, MD       hydrALAZINE (APRESOLINE) injection 5 mg  5 mg Intravenous Q4H PRN Shela Leff, MD       HYDROmorphone (DILAUDID) injection 0.5-1 mg  0.5-1 mg Intravenous Q3H PRN Michael Boston, MD       insulin aspart (novoLOG) injection 0-9 Units  0-9 Units Subcutaneous Q4H Shela Leff, MD       ipratropium-albuterol (DUONEB) 0.5-2.5 (3) MG/3ML nebulizer solution 3 mL  3 mL Nebulization Q6H PRN Shela Leff, MD       lactated ringers infusion   Intravenous Continuous Michael Boston, MD 50 mL/hr at 06/07/21 2136 New Bag at 06/07/21 2136   [START ON 06/08/2021] linaclotide (LINZESS) capsule 72 mcg  72 mcg Oral QAC  breakfast Michael Boston, MD       lip balm (CARMEX) ointment 1 application  1 application Topical BID Michael Boston, MD   1 application at 22/44/97 2137   magic mouthwash  15 mL Oral QID PRN Michael Boston, MD       Magnesium TABS 200 mg  200 mg Oral BID Michael Boston, MD       menthol-cetylpyridinium (CEPACOL) lozenge 3 mg  1 lozenge Oral PRN Michael Boston, MD       methocarbamol (ROBAXIN)  500 mg in dextrose 5 % 50 mL IVPB  500 mg Intravenous Q6H PRN Michael Boston, MD       methocarbamol (ROBAXIN) tablet 500 mg  500 mg Oral Q6H PRN Michael Boston, MD       metoprolol succinate (TOPROL-XL) 24 hr tablet 12.5 mg  12.5 mg Oral Daily Michael Boston, MD       ondansetron [facility]) injection 4 mg  4 mg Intravenous Q6H PRN Michael Boston, MD       Or   ondansetron (ZOFRAN) 8 mg in sodium chloride 0.9 % 50 mL IVPB  8 mg Intravenous Q6H PRN Michael Boston, MD       oxyCODONE (Oxy IR/ROXICODONE) immediate release tablet 5-10 mg  5-10 mg Oral Q4H PRN Michael Boston, MD   5 mg at 06/07/21 2139   pantoprazole (PROTONIX) EC tablet 40 mg  40 mg Oral Daily Michael Boston, MD       phenol (CHLORASEPTIC) mouth spray 2 spray  2 spray Mouth/Throat PRN Michael Boston, MD       piperacillin-tazobactam (ZOSYN) IVPB 3.375 g  3.375 g Intravenous Tor Netters, MD 12.5 mL/hr at 06/07/21 2137 3.375 g at 06/07/21 2137   polyethylene glycol (MIRALAX / GLYCOLAX) packet 17 g  17 g Oral Daily Barkley Boards R, PA-C       polyethylene glycol (MIRALAX / GLYCOLAX) packet 17 g  17 g Oral Q12H PRN Michael Boston, MD       prochlorperazine (COMPAZINE) injection 5-10 mg  5-10 mg Intravenous Q4H PRN Michael Boston, MD       sennosides-docusate sodium (SENOKOT-S) 8.6-50 MG tablet 1 tablet  1 tablet Oral Ardeen Fillers, MD       simethicone (MYLICON) 40 NP/0.0FR suspension 80 mg  80 mg Oral QID PRN Michael Boston, MD       traMADol Veatrice Bourbon) tablet 50 mg  50 mg Oral Q6H PRN Norm Parcel, PA-C       vancomycin (VANCOREADY) IVPB 1500 mg/300 mL  1,500 mg Intravenous Once [name], RPH       zinc gluconate tablet 50 mg  50 mg Oral Daily Michael Boston, MD         Allergies  Allergen Reactions   Ace Inhibitors Cough         ROS:   All other systems reviewed & are negative except per HPI or as noted below.  Of note, patient has fair memory recall right now so do not know how definite these pertinent negatives  are: Constitutional:  No fevers, chills, sweats.  Weight stable Eyes:  No vision changes, No discharge HENT:  No sore throats, nasal drainage Lymph: No neck swelling, No bruising easily Pulmonary:  No cough, productive sputum.  Known COPD followed by Dr. Melvyn Novas.  Not on supplemental oxygen. CV: No orthopnea, PND  Patient walks without difficulty.  No exertional chest/neck/shoulder/arm pain. GI: Positive hemorrhoids with prior banding and chronic constipation.  No personal nor  family history of GI/colon cancer, inflammatory bowel disease, irritable bowel syndrome, allergy such as Celiac Sprue, dietary/dairy problems, colitis, ulcers nor gastritis.  No recent sick contacts/gastroenteritis.  No travel outside the country.  No changes in diet. Renal: No UTIs, No hematuria.  Some difficulty urinating the past few days.  Normally has good voids but does have nocturia about 3 times a night Genital:  No drainage, bleeding, masses Musculoskeletal: No severe joint pain.  Good ROM major joints Skin:  No sores or lesions.  No rashes Heme/Lymph:  No easy bleeding.  No swollen lymph nodes Neuro: No focal weakness/numbness.  No seizures Psych: No suicidal ideation.  No hallucinations  BP (!) 162/87 (BP Location: Right Arm)    Pulse 100    Temp 98.1 F (36.7 C) (Oral)    Resp 16    Ht 6' (1.829 m)    Wt 76.7 kg    SpO2 96%    BMI 22.92 kg/m   Physical Exam:  Constitutional: Sitting up in bed.  Bright and alert and smiling.  Not cachectic.  Hygeine adequate.  Vitals signs as above.   Eyes: Pupils reactive, normal extraocular movements. Sclera nonicteric Neuro: CN II-XII intact.  No major focal sensory defects.  No major motor deficits. Lymph: No head/neck/groin lymphadenopathy Psych:  No severe agitation.  No severe anxiety.  Judgment & insight  superficially seems good but given history of some fair memory recall gives me pause , Oriented x4, HENT: Normocephalic, Mucus membranes moist.  No thrush.   Neck:  Supple, No tracheal deviation.  No obvious thyromegaly Chest: No pain to chest wall compression.  Good respiratory excursion.  No audible wheezing.  No conversational dyspnea. CV:  Pulses intact.  Irregular rhythm.  No major extremity edema  Abdomen:  Flat Hernia: Not present. Diastasis recti: Not present. Soft.   Mildly distended.  Nontender.  No hepatomegaly.  No splenomegaly  Gen:  Inguinal hernia: Not present.  Inguinal lymph nodes: without lymphadenopathy.  Patient only able to urinate about 50 mL at a time.  Rectal: Perianal swelling with left greater than right perirectal pain and fluctuance suspicious for horseshoe type abscess.  Patient having some flatus.  Perianal pain.  I held off on digital exam.  Ext: No obvious deformity or contracture.  Edema: Not present.  No cyanosis Skin: No major subcutaneous nodules.  Warm and dry Musculoskeletal: Severe joint rigidity not present.  No obvious clubbing.  No digital petechiae.     Results:   Labs: Results for orders placed or performed during the hospital encounter of 06/07/21 (from the past 48 hour(s))  Urinalysis, Routine w reflex microscopic Urine, Clean Catch     Status: Abnormal   Collection Time: 06/07/21 11:00 AM  Result Value Ref Range   Color, Urine YELLOW YELLOW   APPearance CLEAR CLEAR   Specific Gravity, Urine 1.020 1.005 - 1.030   pH 5.5 5.0 - 8.0   Glucose, UA NEGATIVE NEGATIVE mg/dL   Hgb urine dipstick NEGATIVE NEGATIVE   Bilirubin Urine NEGATIVE NEGATIVE   Ketones, ur 40 (A) NEGATIVE mg/dL   Protein, ur NEGATIVE NEGATIVE mg/dL   Nitrite NEGATIVE NEGATIVE   Leukocytes,Ua NEGATIVE NEGATIVE    Comment: Microscopic not done on urines with negative protein, blood, leukocytes, nitrite, or glucose < 500 mg/dL. Performed at [facility], [address], [place], [place] [address]   Comprehensive metabolic panel     Status: Abnormal   Collection Time: 06/07/21 11:22 AM  Result  Value Ref Range   Sodium 133  (L) 135 - 145 mmol/L   Potassium 4.4 3.5 - 5.1 mmol/L   Chloride 99 98 - 111 mmol/L   CO2 21 (L) 22 - 32 mmol/L   Glucose, Bld 109 (H) 70 - 99 mg/dL    Comment: Glucose reference range applies only to samples taken after fasting for at least 8 hours.   BUN 30 (H) 8 - 23 mg/dL   Creatinine, Ser 1.60 (H) 0.61 - 1.24 mg/dL   Calcium 8.6 (L) 8.9 - 10.3 mg/dL   Total Protein 7.2 6.5 - 8.1 g/dL   Albumin 3.2 (L) 3.5 - 5.0 g/dL   AST 31 15 - 41 U/L   ALT 29 0 - 44 U/L   Alkaline Phosphatase 81 38 - 126 U/L   Total Bilirubin 1.2 0.3 - 1.2 mg/dL   GFR, Estimated 41 (L) >60 mL/min    Comment: (NOTE) Calculated using the CKD-EPI Creatinine Equation (2021)    Anion gap 13 5 - 15    Comment: Performed at [facility], [address] [place], [place] [address]  CBC with Differential     Status: Abnormal   Collection Time: 06/07/21 11:22 AM  Result Value Ref Range   WBC 11.8 (H) 4.0 - 10.5 K/uL   RBC 4.69 4.22 - 5.81 MIL/uL   Hemoglobin 13.4 13.0 - 17.0 g/dL   HCT 40.4 39.0 - 52.0 %   MCV 86.1 80.0 - 100.0 fL   MCH 28.6 26.0 - 34.0 pg   MCHC 33.2 30.0 - 36.0 g/dL   RDW 13.7 11.5 - 15.5 %   Platelets 264 150 - 400 K/uL   nRBC 0.0 0.0 - 0.2 %   Neutrophils Relative % 86 %   Neutro Abs 10.1 (H) 1.7 - 7.7 K/uL   Lymphocytes Relative 4 %   Lymphs Abs 0.5 (L) 0.7 - 4.0 K/uL   Monocytes Relative 10 %   Monocytes Absolute 1.1 (H) 0.1 - 1.0 K/uL   Eosinophils Relative 0 %   Eosinophils Absolute 0.0 0.0 - 0.5 K/uL   Basophils Relative 0 %   Basophils Absolute 0.0 0.0 - 0.1 K/uL   Immature Granulocytes 0 %   Abs Immature Granulocytes 0.04 0.00 - 0.07 K/uL    Comment: Performed at [facility], [address] [place], [place] [address]  Occult blood card to lab, stool Provider will collect     Status: Abnormal   Collection Time: 06/07/21 11:58 AM  Result Value Ref Range   Fecal Occult Bld POSITIVE (A) NEGATIVE    Comment: Performed at [facility], [address] [place], [place] [address]  Resp Panel by RT-PCR (Flu A&B, Covid) Nasopharyngeal Swab     Status: None   Collection Time: 06/07/21  1:18 PM   Specimen: Nasopharyngeal Swab; Nasopharyngeal(NP) swabs in vial transport medium  Result Value Ref Range   SARS Coronavirus 2 by RT PCR NEGATIVE NEGATIVE    Comment: (NOTE) SARS-CoV-2 target nucleic acids are NOT DETECTED.  The SARS-CoV-2 RNA is generally detectable in upper respiratory specimens during the acute phase of infection. The lowest concentration of SARS-CoV-2 viral copies this assay can detect is 138 copies/mL. A negative result does not preclude SARS-Cov-2 infection and should not be used as the sole basis for treatment or other patient management decisions. A negative result may occur with  improper specimen collection/handling, submission of specimen other than nasopharyngeal swab, presence  of viral mutation(s) within the areas targeted by this assay, and inadequate number of viral copies(<138 copies/mL). A negative result must be combined with clinical observations, patient history, and epidemiological information. The expected result is Negative.  Fact Sheet for Patients:  EntrepreneurPulse.com.au  Fact Sheet for Healthcare Providers:  IncredibleEmployment.be  This test is no t yet approved or cleared by the [place] FDA and  has been authorized for detection and/or diagnosis of SARS-CoV-2 by FDA under an Emergency Use Authorization (EUA). This EUA will remain  in effect (meaning this test can be used) for the duration of the COVID-19 declaration under Section 564(b)(1) of the Act, 21 U.S.C.section 360bbb-3(b)(1), unless the authorization is terminated  or revoked sooner.       Influenza A by PCR NEGATIVE NEGATIVE   Influenza B by PCR NEGATIVE NEGATIVE    Comment: (NOTE) The Xpert Xpress SARS-CoV-2/FLU/RSV plus assay is intended as an aid in the diagnosis of  influenza from Nasopharyngeal swab specimens and should not be used as a sole basis for treatment. Nasal washings and aspirates are unacceptable for Xpert Xpress SARS-CoV-2/FLU/RSV testing.  Fact Sheet for Patients: EntrepreneurPulse.com.au  Fact Sheet for Healthcare Providers: IncredibleEmployment.be  This test is not yet approved or cleared by the [place] FDA and has been authorized for detection and/or diagnosis of SARS-CoV-2 by FDA under an Emergency Use Authorization (EUA). This EUA will remain in effect (meaning this test can be used) for the duration of the COVID-19 declaration under Section 564(b)(1) of the Act, 21 U.S.C. section 360bbb-3(b)(1), unless the authorization is terminated or revoked.  Performed at [facility], [address] [place], [place] [address]   Glucose, capillary     Status: None   Collection Time: 06/07/21  9:37 PM  Result Value Ref Range   Glucose-Capillary 99 70 - 99 mg/dL    Comment: Glucose reference range applies only to samples taken after fasting for at least 8 hours.    Imaging / Studies: DG Chest 2 View  Result Date: 06/07/2021 CLINICAL DATA:  Concern for pneumonia. EXAM: CHEST - 2 VIEW COMPARISON:  Chest x-ray 08/14/2020.  Chest CT 07/17/2019. FINDINGS: There are minimal fibrotic changes in the lung bases similar to the prior study. There is no new lung consolidation, pleural effusion or pneumothorax identified. There are atherosclerotic calcifications of the aorta. Heart is borderline enlarged unchanged. ICD is again noted. No acute fractures are seen. IMPRESSION: 1.  No acute cardiopulmonary process. 2.  Stable fibrotic changes in the lung bases. Electronically Signed   By: Ronney Asters M.D.   On: 06/07/2021 21:43   CT Abdomen Pelvis W Contrast  Result Date: 06/07/2021 CLINICAL DATA:  Pelvic pain, rectal pain EXAM: CT ABDOMEN AND PELVIS WITH CONTRAST TECHNIQUE: Multidetector CT imaging  of the abdomen and pelvis was performed using the standard protocol following bolus administration of intravenous contrast. CONTRAST:  4m OMNIPAQUE IOHEXOL 300 MG/ML  SOLN COMPARISON:  11/04/2020 FINDINGS: Lower chest: New small patchy alveolar infiltrates are seen in both lower lobes. There is minimal right pleural effusion. Small pericardial effusion is seen. Pacemaker/defibrillator leads are noted in the heart. There are surgical clips close to the gastroesophageal junction. Hepatobiliary: There is fatty infiltration. There are few small low-density foci in the liver largest measuring 8 mm with no significant interval change. Gallbladder is unremarkable. Pancreas: No focal abnormality is seen Spleen: Unremarkable. 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 There is no hydronephrosis. There are  no renal or ureteral stones. Urinary bladder is distended. There is 11 mm smooth marginated low-density structure in the lower pole of right kidney suggesting renal cyst. There is possible 3 mm cyst in the medial midportion of left kidney. Stomach/Bowel: Small hiatal hernia is seen. Small bowel loops are not dilated. Appendix is not seen. There is no pericecal inflammation. No significant wall thickening is seen in colon. Multiple diverticula are seen in colon without signs of focal acute diverticulitis. There is a large multiloculated fluid collection with thick wall in the perirectal and perianal region measuring approximately 6.2 x 5.6 by 5.2 cm. There are no pockets of air in the fluid collection. Presacral soft tissues are unremarkable. Vascular/Lymphatic: Atherosclerotic plaques and calcifications are seen in the aorta and its major branches. There is 3.2 cm aneurysm in the infrarenal aorta. No significant lymphadenopathy seen. Reproductive: Unremarkable. Other: There is no ascites or pneumoperitoneum. Musculoskeletal: Degenerative changes are noted in the lumbar  spine with spinal stenosis and encroachment of neural foramina at multiple levels. IMPRESSION: There is interval appearance of large lobulated thick-walled fluid collection around the lower rectum and anus along the lateral and posterior walls measuring 6.2 cm in maximum diameter suggesting perirectal/perianal abscess. Surgical consultation should be considered. There are new patchy infiltrates in both lower lobes suggesting multifocal pneumonia or aspiration. There is no evidence of intestinal obstruction or pneumoperitoneum. There is no hydronephrosis. Diverticulosis of colon without signs of focal acute diverticulitis. There is 3.2 cm infrarenal aortic aneurysm. Small hiatal hernia. Small pericardial effusion. Possible hepatic and renal cysts. Electronically Signed   By: Daniel Picker M.D.   On: 06/07/2021 12:46   CUP PACEART REMOTE DEVICE CHECK  Result Date: 05/14/2021 Scheduled remote reviewed. Normal device function.  2 NSVT, 5 & 14 beats, rates 158 & 185 Optivol crossed threshold 12/6 and is ongoing Next remote 91 days. LR   Medications / Allergies: per chart  Antibiotics: Anti-infectives (From admission, onward)    Start     Dose/Rate Route Frequency Ordered Stop   06/08/21 0600  ceFAZolin (ANCEF) IVPB 2g/100 mL premix       See Hyperspace for full Linked Orders Report.   2 g 200 mL/hr over 30 Minutes Intravenous On call to O.R. 06/07/21 2026 06/09/21 0559   06/08/21 0600  metroNIDAZOLE (FLAGYL) IVPB 500 mg       See Hyperspace for full Linked Orders Report.   500 mg 100 mL/hr over 60 Minutes Intravenous On call to O.R. 06/07/21 2026 06/09/21 0559   06/07/21 2230  vancomycin (VANCOREADY) IVPB 1500 mg/300 mL        1,500 mg 150 mL/hr over 120 Minutes Intravenous  Once 06/07/21 2044     06/07/21 2200  piperacillin-tazobactam (ZOSYN) IVPB 3.375 g        3.375 g 12.5 mL/hr over 240 Minutes Intravenous Every 8 hours 06/07/21 2034 06/12/21 2159   06/07/21 1530  azithromycin  (ZITHROMAX) 500 mg in sodium chloride 0.9 % 250 mL IVPB        500 mg 250 mL/hr over 60 Minutes Intravenous  Once 06/07/21 1523 06/07/21 1710   06/07/21 1315  cefTRIAXone (ROCEPHIN) 1 g in sodium chloride 0.9 % 100 mL IVPB  Status:  Discontinued        1 g 200 mL/hr over 30 Minutes Intravenous Every 24 hours 06/07/21 1302 06/07/21 2034   06/07/21 1315  metroNIDAZOLE (FLAGYL) IVPB 500 mg        500 mg 100 mL/hr over 60  Minutes Intravenous  Once 06/07/21 1302 06/07/21 1505         Note: Portions of this report may have been transcribed using voice recognition software. Every effort was made to ensure accuracy; however, inadvertent computerized transcription errors may be present.   Any transcriptional errors that result from this process are unintentional.    Adin Hector, MD, FACS, MASCRS Esophageal, Gastrointestinal & Colorectal Surgery Robotic and Minimally Invasive Surgery  [facility], [place]  [place]. [address], [place], [place] [address] [phone] Fax [phone] Main  CONTACT INFORMATION:  Weekday (9AM-5PM): Call [facility] main office at [phone]  Weeknight (5PM-9AM) or Weekend/Holiday: Check [email or web address] (password " [email or web address]") for General Surgery [facility] coverage  (Please, do not use SecureChat as it is not reliable communication to operating surgeons for immediate patient care)       06/07/2021  10:26 PM

## 2021-06-07 NOTE — Progress Notes (Signed)
Pharmacy Antibiotic Note  Daniel Woodard is a 88 y.o. male admitted on 06/07/2021 with perirectal abscess.  Pharmacy has been consulted for vancomycin dosing. For likely I&D in OR 1/10  Plan: Zosyn 3.375g IV q8h (4 hour infusion). Vancomycin 1500 mg IV x 1 followed by vancomycin 1250 mg IV q36 for est AUC using SCr 1.6, TBW, Vd 0.72 Daily SCr while on combination of vanc & zosyn  Height: 6' (182.9 cm) Weight: 76.7 kg (169 lb) IBW/kg (Calculated) : 77.6  Temp (24hrs), Avg:97.6 F (36.4 C), Min:97 F (36.1 C), Max:98.1 F (36.7 C)  Recent Labs  Lab 06/07/21 1122  WBC 11.8*  CREATININE 1.60*    Estimated Creatinine Clearance: 34 mL/min (A) (by C-G formula based on SCr of 1.6 mg/dL (H)).    Allergies  Allergen Reactions   Ace Inhibitors Cough         Antimicrobials this admission: 1/9 azith x 1 1/9 CTX x 1 1/9 flagyl x 1 1/9 zosyn>> 1/9 vanc>> Dose adjustments this admission:  Microbiology results:   Thank you for allowing pharmacy to be a part of this patients care.  [name] 06/07/2021 8:57 PM

## 2021-06-07 NOTE — ED Provider Notes (Signed)
[facility] Provider Note   CSN: [id number] Arrival date & time: 06/07/21  0935     History  Chief Complaint  Patient presents with   Constipation   Rectal Pain    Daniel Woodard is a 88 y.o. male with history of constipation and mild dementia who presents with concern for anal and perianal pain for the last week.  He states that his rectum is been hurting intermittently for the last 4 months with intermittent constipation but that has been worse in the last few days.  History is somewhat difficult to obtain from the patient given intermittent confusion, stating that he has had loose stools in the last few days but then stating he has not passed a bowel movement in over a week.  Daughter at the bedside states that he has not had any stool x4 days.  History of constipation.  No fevers or chills, passing flatus.  Urinating normally.  Have personally reviewed this patient's medical record.  He has history of diabetes, hyperlipidemia, bradycardia, COPD, PVD, ocular complications, CKD, CHF, and complete heart block.  Also has history of carotid artery occlusion.  He is not anticoagulated.  HPI     Home Medications Prior to Admission medications   Medication Sig Start Date End Date Taking? Authorizing Provider  ASPIRIN 81 PO Take 1 tablet by mouth daily.   Yes [provider]  metoprolol succinate (TOPROL-XL) 25 MG 24 hr tablet Take 1/2 (one-half) tablet by mouth once daily 11/18/20  Yes Biagio Borg, MD  rosuvastatin (CRESTOR) 20 MG tablet TAKE 2 TABLETS BY MOUTH EVERY OTHER DAY 06/03/21  Yes Biagio Borg, MD  Coenzyme Q10 (COQ-10) 200 MG CAPS Take 200 mg by mouth daily after lunch.    [provider]  furosemide (LASIX) 40 MG tablet Take 1 tablet by mouth once daily 08/20/20   Biagio Borg, MD  linaclotide [facility]) 72 MCG capsule Take 1 capsule (72 mcg total) by mouth daily before breakfast. 10/05/20   Biagio Borg, MD  Magnesium 200 MG  TABS Take 200 mg by mouth 2 (two) times daily.    [provider]  omeprazole (PRILOSEC) 20 MG capsule Take 1 capsule by mouth once daily 11/18/20   Biagio Borg, MD  potassium chloride SA (KLOR-CON) 20 MEQ tablet Take 1 tablet (20 mEq total) by mouth daily. 08/11/20   Evans Lance, MD  Psyllium (METAMUCIL PO) Take 2 capsules by mouth daily as needed (constipation).    [provider]  sennosides-docusate sodium (SENOKOT-S) 8.6-50 MG tablet Take 1 tablet by mouth at bedtime. 10/05/20   Biagio Borg, MD  VITAMIN D PO Take 1,000 Units by mouth daily.    [provider]  zinc gluconate 50 MG tablet Take 50 mg by mouth daily.    [provider]      Allergies    Ace inhibitors    Review of Systems   Review of Systems  Constitutional: Negative.   HENT: Negative.    Respiratory: Negative.    Cardiovascular: Negative.   Gastrointestinal:  Positive for constipation and rectal pain. Negative for anal bleeding, blood in stool, diarrhea, nausea and vomiting.  Genitourinary: Negative.   Musculoskeletal: Negative.   Skin: Negative.   Neurological: Negative.   Hematological: Negative.   Psychiatric/Behavioral: Negative.     Physical Exam Updated Vital Signs BP (!) 156/64    Pulse 86    Temp (!) 97 F (36.1 C)  Resp 18    Ht 6' (1.829 m)    Wt 76.7 kg    SpO2 95%    BMI 22.92 kg/m  Physical Exam Vitals and nursing note reviewed. Exam conducted with a chaperone present.  Constitutional:      Appearance: He is not ill-appearing or toxic-appearing.  HENT:     Head: Normocephalic and atraumatic.     Nose: Nose normal. No congestion.     Mouth/Throat:     Mouth: Mucous membranes are moist.     Pharynx: No oropharyngeal exudate or posterior oropharyngeal erythema.  Eyes:     General:        Right eye: No discharge.        Left eye: No discharge.     Extraocular Movements: Extraocular movements intact.     Conjunctiva/sclera: Conjunctivae normal.      Pupils: Pupils are equal, round, and reactive to light.  Cardiovascular:     Rate and Rhythm: Normal rate and regular rhythm.     Pulses: Normal pulses.     Heart sounds: Normal heart sounds. No murmur heard. Pulmonary:     Effort: Pulmonary effort is normal. No tachypnea, bradypnea, accessory muscle usage, prolonged expiration or respiratory distress.     Breath sounds: Normal breath sounds. No wheezing or rales.  Abdominal:     General: Bowel sounds are normal. There is no distension.     Palpations: Abdomen is soft.     Tenderness: There is no abdominal tenderness. There is no right CVA tenderness, left CVA tenderness, guarding or rebound.  Genitourinary:    Rectum: Guaiac result positive. Tenderness present.     Comments: Edema and exquisit TTP from the 3 o'clock to 6 o'clock positions on DRE. No fecal impaction in the rectum. Nonmelanotic appearing stool on the gloved finger of this provider following exam. Hemoccult positive.  Musculoskeletal:        General: No deformity.     Cervical back: Normal range of motion and neck supple. No tenderness.     Right lower leg: No edema.     Left lower leg: No edema.  Lymphadenopathy:     Cervical: No cervical adenopathy.  Skin:    General: Skin is warm and dry.     Capillary Refill: Capillary refill takes less than 2 seconds.     Findings: No rash.  Neurological:     General: No focal deficit present.     Mental Status: He is alert and oriented to person, place, and time. Mental status is at baseline.     GCS: GCS eye subscore is 4. GCS verbal subscore is 5. GCS motor subscore is 6.     Gait: Gait is intact.  Psychiatric:        Mood and Affect: Mood normal.    ED Results / Procedures / Treatments   Labs (all labs ordered are listed, but only abnormal results are displayed) Labs Reviewed  COMPREHENSIVE METABOLIC PANEL - Abnormal; Notable for the following components:      Result Value   Sodium 133 (*)    CO2 21 (*)    Glucose,  Bld 109 (*)    BUN 30 (*)    Creatinine, Ser 1.60 (*)    Calcium 8.6 (*)    Albumin 3.2 (*)    GFR, Estimated 41 (*)    All other components within normal limits  CBC WITH DIFFERENTIAL/PLATELET - Abnormal; Notable for the following components:   WBC 11.8 (*)  Neutro Abs 10.1 (*)    Lymphs Abs 0.5 (*)    Monocytes Absolute 1.1 (*)    All other components within normal limits  URINALYSIS, ROUTINE W REFLEX MICROSCOPIC - Abnormal; Notable for the following components:   Ketones, ur 40 (*)    All other components within normal limits  OCCULT BLOOD X 1 CARD TO LAB, STOOL - Abnormal; Notable for the following components:   Fecal Occult Bld POSITIVE (*)    All other components within normal limits  RESP PANEL BY RT-PCR (FLU A&B, COVID) ARPGX2  POC OCCULT BLOOD, ED    EKG Ekg: sinus rhythm, no STEMI.   Radiology CT Abdomen Pelvis W Contrast  Result Date: 06/07/2021 CLINICAL DATA:  Pelvic pain, rectal pain EXAM: CT ABDOMEN AND PELVIS WITH CONTRAST TECHNIQUE: Multidetector CT imaging of the abdomen and pelvis was performed using the standard protocol following bolus administration of intravenous contrast. CONTRAST:  9mL OMNIPAQUE IOHEXOL 300 MG/ML  SOLN COMPARISON:  11/04/2020 FINDINGS: Lower chest: New small patchy alveolar infiltrates are seen in both lower lobes. There is minimal right pleural effusion. Small pericardial effusion is seen. Pacemaker/defibrillator leads are noted in the heart. There are surgical clips close to the gastroesophageal junction. Hepatobiliary: There is fatty infiltration. There are few small low-density foci in the liver largest measuring 8 mm with no significant interval change. Gallbladder is unremarkable. Pancreas: No focal abnormality is seen Spleen: Unremarkable. 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 There is no hydronephrosis. There are no renal or ureteral stones. Urinary bladder is distended. There is 11  mm smooth marginated low-density structure in the lower pole of right kidney suggesting renal cyst. There is possible 3 mm cyst in the medial midportion of left kidney. Stomach/Bowel: Small hiatal hernia is seen. Small bowel loops are not dilated. Appendix is not seen. There is no pericecal inflammation. No significant wall thickening is seen in colon. Multiple diverticula are seen in colon without signs of focal acute diverticulitis. There is a large multiloculated fluid collection with thick wall in the perirectal and perianal region measuring approximately 6.2 x 5.6 by 5.2 cm. There are no pockets of air in the fluid collection. Presacral soft tissues are unremarkable. Vascular/Lymphatic: Atherosclerotic plaques and calcifications are seen in the aorta and its major branches. There is 3.2 cm aneurysm in the infrarenal aorta. No significant lymphadenopathy seen. Reproductive: Unremarkable. Other: There is no ascites or pneumoperitoneum. Musculoskeletal: Degenerative changes are noted in the lumbar spine with spinal stenosis and encroachment of neural foramina at multiple levels. IMPRESSION: There is interval appearance of large lobulated thick-walled fluid collection around the lower rectum and anus along the lateral and posterior walls measuring 6.2 cm in maximum diameter suggesting perirectal/perianal abscess. Surgical consultation should be considered. There are new patchy infiltrates in both lower lobes suggesting multifocal pneumonia or aspiration. There is no evidence of intestinal obstruction or pneumoperitoneum. There is no hydronephrosis. Diverticulosis of colon without signs of focal acute diverticulitis. There is 3.2 cm infrarenal aortic aneurysm. Small hiatal hernia. Small pericardial effusion. Possible hepatic and renal cysts. Electronically Signed   By: Elmer Picker M.D.   On: 06/07/2021 12:46    Procedures .Critical Care Performed by: Emeline Darling, PA-C Authorized by:  Emeline Darling, PA-C   Critical care provider statement:    Critical care time (minutes):  45   Critical care was time spent personally by me on the following activities:  Development of treatment plan with patient or  surrogate, discussions with consultants, evaluation of patient's response to treatment, examination of patient, obtaining history from patient or surrogate, ordering and performing treatments and interventions, ordering and review of laboratory studies, ordering and review of radiographic studies, pulse oximetry and re-evaluation of patient's condition    Medications Ordered in ED Medications  cefTRIAXone (ROCEPHIN) 1 g in sodium chloride 0.9 % 100 mL IVPB (0 g Intravenous Stopped 06/07/21 1358)  azithromycin (ZITHROMAX) 500 mg in sodium chloride 0.9 % 250 mL IVPB (500 mg Intravenous New Bag/Given 06/07/21 1559)  acetaminophen (TYLENOL) tablet 650 mg (has no administration in time range)  traMADol (ULTRAM) tablet 50 mg (has no administration in time range)  morphine 2 MG/ML injection 2 mg (has no administration in time range)  docusate sodium (COLACE) capsule 100 mg (has no administration in time range)  polyethylene glycol (MIRALAX / GLYCOLAX) packet 17 g (has no administration in time range)  iohexol (OMNIPAQUE) 300 MG/ML solution 100 mL (80 mLs Intravenous Contrast Given 06/07/21 1217)  metroNIDAZOLE (FLAGYL) IVPB 500 mg (0 mg Intravenous Stopped 06/07/21 1505)    ED Course/ Medical Decision Making/ A&P Clinical Course as of 06/07/21 1659  Mon Jun 07, 2021  1252 CT Abdomen Pelvis W Contrast [RS]  1610 Consult to general surgeon, Dr. Kieth Brightly, who requests medical admission to [facility] for IV antibiotics where he will evaluate the patient on the inpatient side.  I appreciate his collaboration in the care of this patient. [RS]  9604 Consult to hospitalist, Dr. Marylyn Ishihara, who states that hospital medicine service should be happy to consult on this patient to manage his  chronic conditions, however as patient has a primarily surgical concern and his chronic conditions are stable at this time, he does not feel it is appropriate for the patient to be admitted to the hospital medicine service.  He is requesting that I reconsult surgery and if medical admission is still desired by their team he is requesting that the general surgeon communicate with him directly.  I appreciate his collaboration in the care of this patient. [RS]  1332 Consult again to general surgery Dr. Kieth Brightly, who is agreeable to calling the hospital service directly to discuss admission for this patient.  Hospitalist number provided, I appreciate his collaboration in the care of this patient. [RS]  5409 Consult call received from Dr. Kieth Brightly, general surgeon, who states he has discussed the patient's case with the hospitalist and is agreeable to admitting patient to the surgery service.  [facility] in the surgery floor once a bed is available.  I appreciate his collaboration in the care of this patient. [RS]    Clinical Course User Index [RS] Deya Bigos, Gypsy Balsam, PA-C                           Medical Decision Making This patient presents to the ED for concern of rectal pain and constipation, this involves an extensive number of treatment options, and is a complaint that carries with it a high risk of complications and morbidity.  The differential diagnosis includes but is not limited to:  anal fissure/fistula, malignancy, anorectal /perirectal abscess, constipation, large bowel obstruction, hemorrhoids, proctitis, prostatitis.   Hypertensive on intake, vital signs otherwise normal.  Cardiopulmonary exam and abdominal exams are benign.  DRE performed in presence of chaperone concerning for soft tissue edema, exquisite tenderness palpation.  Hemoccult positive.  CT abdomen pelvis significant for 6 x 5.6 by  5 cm perirectal abscess in the left lateral and posterior  aspect.  Comorbidities that complicate the patient evaluation: Diabetes Hypertension Peripheral vascular disease COPD CHF CKD History of complete heart block   Additional history obtained:  Additional history obtained from chart review and daughter at the bedside External records from outside source obtained and reviewed including none   Lab Tests:  I Ordered, reviewed, and interpreted labs.  The pertinent results include:   CBC with leukocytosis of 11.8, normal hemoglobin.  CMP with mild hyponatremia 133, BUN/creatinine 30/1.6 near patient's baseline.  Fecal occult positive, refer pathogen panel is negative.  UA is normal.   Imaging Studies ordered:  I ordered imaging studies including CT of the abdomen pelvis. I independently visualized and interpreted imaging which showed Large 6 x 5.6 by 5 centimeters perirectal abscess.  Additionally patchy bilateral lower lobe infiltrates suggesting pneumonia or aspiration, however patient denies any respiratory symptoms at this time, pulmonary exam is reassuring.Large 6 x 5.6 by 5 centimeters perirectal abscess.  Additionally patchy bilateral lower lobe infiltrates suggesting pneumonia or aspiration, however patient denies any respiratory symptoms at this time, pulmonary exam is reassuring. I agree with the radiologist interpretation   Cardiac Monitoring:  The patient was maintained on a cardiac monitor.  I personally viewed and interpreted the cardiac monitored which showed an underlying rhythm of: sinus rhythm   Medicines ordered and prescription drug management:  I ordered medication including IV antibiotic therapy  for perirectal/perianal abscess, will also cover for CAP given CT findings. Analgesia offered, patient declined.  Reevaluation of the patient after these medicines showed that the patient is still hemodynamically stable, well appearing, tolerating PO.  I have reviewed the patients home medicines and have made adjustments  as needed  Tests Considered: NO further testing considered.    Critical Interventions:  Antibiotics for infectious etiology of symptoms.    Consultations Obtained:  I requested consultation with the general surgery and hospitalist as above,  and discussed lab and imaging findings as well as pertinent plan - they recommend: admission to general surgery service at [facility] hospital, where he will undergo surgical consultation and likely procedure.    Problem List / ED Course: Rectal pain: perirectal abscess, on IV antibiotics Incidental bilateral lower lobe patchy infiltrates: patient without respiratory symptoms, doutbt infectious etiology however will cover for CAP.  Hypertension: will continue patient's home hypertension medications. CKD: Cr and GFR at patient baseline.     Reevaluation:  After the interventions noted above, I reevaluated the patient and found that they have : He remains stable and well-appearing, declines analgesia.   Social Determinants of Health Lives alone, elderly, extensive medical history.   Dispostion:  After consideration of the diagnostic results and the patients response to treatment feel that the patent would benefit from admission to the hospital for management of his perirectal infection.   Cuthbert and his family voiced understanding of his medical evaluation and treatment plan.  Each of their questions was answered to their expressed satisfaction.  They are amenable to plan for admission at this time.  This chart was dictated using voice recognition software, Dragon. Despite the best efforts of this provider to proofread and correct errors, errors may still occur which can change documentation meaning.   Final Clinical Impression(s) / ED Diagnoses Final diagnoses:  None    Rx / DC Orders ED Discharge Orders     None         Emeline Darling, PA-C 06/07/21 1700  Malvin Johns, MD 06/08/21 [phone]

## 2021-06-07 NOTE — Consult Note (Signed)
[facility] Medical Consultation  Daniel Woodard YBO:[id number] DOB: September 25, 1929 DOA: 06/07/2021 PCP: Biagio Borg, MD   Requesting physician: Dr. Kieth Brightly Date of consultation: 06/07/2021 Reason for consultation: Management of chronic medical conditions  Chief Complaint: Constipation and rectal pain  HPI: Patient is a 88 year old male with a past medical history of A. fib, CHB status post PPM, hypertension, hyperlipidemia, chronic systolic CHF/nonischemic cardiomyopathy, COPD, BPH, PVD, carotid artery occlusion status post CEA, type 2 diabetes, CKD stage IIIb presented to [facility] ED with complaints of constipation and rectal pain.  Not febrile.  Labs showing WBC 11.8.  Hemoglobin normal.  FOBT positive.  CT showing findings suggestive of perirectal/perianal abscess.  Also showing new patchy infiltrates in both lower lobes suggesting multifocal pneumonia or aspiration.  COVID and influenza PCR negative. Patient was given Colace, MiraLAX, ceftriaxone, azithromycin, Flagyl, and morphine.  Patient admitted under general surgery service and [facility] consulted for management of chronic medical conditions.  Patient reports rectal pain for several years along with constipation.  States his symptoms became much worse recently making it difficult for him to have bowel movements and to sit.  He was taking over-the-counter laxatives.  Denies rectal bleeding or discharge.  Denies fevers or chills.  He has no other complaints.  Denies cough, shortness of breath, chest pain, nausea, vomiting, abdominal pain, dysuria, or urinary frequency/urgency.  Denies orthopnea or lower extremity edema.  Past Medical History:  Diagnosis Date   ALLERGIC RHINITIS 04/09/2007   Atrial fibrillation ([place])    BACTEREMIA, 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   BENIGN PROSTATIC HYPERTROPHY 04/09/2007   Blood transfusion without reported diagnosis    really not sure thinks 30-40 years ago   BRADYCARDIA 05/11/2009   Carotid artery  occlusion    COLONIC POLYPS, HX OF 04/09/2007   ADENOMATOUS POLYPS 2004,2008   Complete heart block (HCC)    COPD 04/09/2007   Cough 03/27/2008   DEPRESSION 04/09/2007   DIABETES MELLITUS, TYPE II 10/09/2007   DIVERTICULOSIS, COLON 04/09/2007   DIZZINESS 10/06/2008   GLUCOSE INTOLERANCE 04/09/2007   HYPERLIPIDEMIA 03/27/2007   HYPERTENSION 03/27/2007   Kidney stone    PERIPHERAL VASCULAR DISEASE 03/27/2007   PNEUMONIA ORGANISM NOS 11/08/2007   SHINGLES, HX OF 04/09/2007   SHOULDER PAIN, BILATERAL 08/01/2007   Ulcer    40 years ago   Past Surgical History:  Procedure Laterality Date   APPENDECTOMY     88 years old   BIV UPGRADE N/A 08/14/2020   Procedure: BIV PPM UPGRADE;  Surgeon: Evans Lance, MD;  Location: [place] [facility];  Service: Cardiovascular;  Laterality: N/A;   CAROTID ENDARTERECTOMY  5/08   left   CARPAL TUNNEL RELEASE     bilateral   COLONOSCOPY     ESOPHAGOGASTRODUODENOSCOPY  06/07/2004   HEMORRHOID BANDING  1990s   LEFT HEART CATH AND CORONARY ANGIOGRAPHY N/A 06/02/2020   Procedure: LEFT HEART CATH AND CORONARY ANGIOGRAPHY;  Surgeon: Jettie Booze, MD;  Location: [place] [facility];  Service: Cardiovascular;  Laterality: N/A;   PACEMAKER INSERTION  07/11/2013   MDT ADDRL1 pacemaker implanted by Dr Lovena Le for complete heart block   PERMANENT PACEMAKER INSERTION N/A 07/11/2013   Procedure: PERMANENT PACEMAKER INSERTION;  Surgeon: Evans Lance, MD;  Location: [facility] CATH LAB;  Service: Cardiovascular;  Laterality: N/A;   s/p PUD surgury     ? partial gastrectomy   Social History:  reports that he quit smoking about 21 years ago. His smoking use included cigarettes. He has  a 50.00 pack-year smoking history. He has never used smokeless tobacco. He reports that he does not drink alcohol and does not use drugs.  Allergies  Allergen Reactions   Ace Inhibitors Cough        Family History  Problem Relation Age of Onset   Hypertension Father    Ulcers  Father    Hypertension Mother    Hypertension Sister    Peripheral vascular disease Brother    Hypertension Sister    Ulcers Sister        Ulcers   Hypertension Sister    Lung cancer Sister        smoked   Heart disease Sister        Carotid    Hypertension Sister    Heart disease Other        Cardiovascular disorder, CHF   Stroke Other        1st degree relative male and male   Colon cancer Neg Hx     Prior to Admission medications   Medication Sig Start Date End Date Taking? Authorizing Provider  ASPIRIN 81 PO Take 1 tablet by mouth daily.   Yes [provider]  metoprolol succinate (TOPROL-XL) 25 MG 24 hr tablet Take 1/2 (one-half) tablet by mouth once daily 11/18/20  Yes Biagio Borg, MD  rosuvastatin (CRESTOR) 20 MG tablet TAKE 2 TABLETS BY MOUTH EVERY OTHER DAY 06/03/21  Yes Biagio Borg, MD  Coenzyme Q10 (COQ-10) 200 MG CAPS Take 200 mg by mouth daily after lunch.    [provider]  furosemide (LASIX) 40 MG tablet Take 1 tablet by mouth once daily 08/20/20   Biagio Borg, MD  linaclotide [facility]) 72 MCG capsule Take 1 capsule (72 mcg total) by mouth daily before breakfast. 10/05/20   Biagio Borg, MD  Magnesium 200 MG TABS Take 200 mg by mouth 2 (two) times daily.    [provider]  omeprazole (PRILOSEC) 20 MG capsule Take 1 capsule by mouth once daily 11/18/20   Biagio Borg, MD  potassium chloride SA (KLOR-CON) 20 MEQ tablet Take 1 tablet (20 mEq total) by mouth daily. 08/11/20   Evans Lance, MD  Psyllium (METAMUCIL PO) Take 2 capsules by mouth daily as needed (constipation).    [provider]  sennosides-docusate sodium (SENOKOT-S) 8.6-50 MG tablet Take 1 tablet by mouth at bedtime. 10/05/20   Biagio Borg, MD  VITAMIN D PO Take 1,000 Units by mouth daily.    [provider]  zinc gluconate 50 MG tablet Take 50 mg by mouth daily.    [provider]   Physical Exam: Blood pressure (!) 184/85, pulse (!) 107,  temperature 97.6 F (36.4 C), temperature source Oral, resp. rate 16, height 6' (1.829 m), weight 76.7 kg, SpO2 96 %. Vitals:   06/07/21 1800 06/07/21 1918  BP: 127/87 (!) 184/85  Pulse: 99 (!) 107  Resp:  16  Temp:  97.6 F (36.4 C)  SpO2: 94% 96%    Physical Exam Constitutional:      General: He is not in acute distress. HENT:     Head: Normocephalic and atraumatic.  Eyes:     Extraocular Movements: Extraocular movements intact.     Conjunctiva/sclera: Conjunctivae normal.  Cardiovascular:     Rate and Rhythm: Normal rate and regular rhythm.     Pulses: Normal pulses.  Pulmonary:     Effort: Pulmonary effort is normal. No respiratory distress.  Breath sounds: Normal breath sounds. No wheezing or rales.  Abdominal:     General: Bowel sounds are normal. There is no distension.     Palpations: Abdomen is soft.     Tenderness: There is no abdominal tenderness. There is no guarding or rebound.  Musculoskeletal:        General: No swelling or tenderness.     Cervical back: Normal range of motion and neck supple.  Skin:    General: Skin is warm and dry.  Neurological:     General: No focal deficit present.     Mental Status: He is alert and oriented to person, place, and time.     Labs on Admission:  Basic Metabolic Panel: Recent Labs  Lab 06/07/21 1122  NA 133*  K 4.4  CL 99  CO2 21*  GLUCOSE 109*  BUN 30*  CREATININE 1.60*  CALCIUM 8.6*   Liver Function Tests: Recent Labs  Lab 06/07/21 1122  AST 31  ALT 29  ALKPHOS 81  BILITOT 1.2  PROT 7.2  ALBUMIN 3.2*   No results for input(s): LIPASE, AMYLASE in the last 168 hours. No results for input(s): AMMONIA in the last 168 hours. CBC: Recent Labs  Lab 06/07/21 1122  WBC 11.8*  NEUTROABS 10.1*  HGB 13.4  HCT 40.4  MCV 86.1  PLT 264   Cardiac Enzymes: No results for input(s): CKTOTAL, CKMB, CKMBINDEX, TROPONINI in the last 168 hours. BNP: Invalid input(s): POCBNP CBG: No results for input(s):  GLUCAP in the last 168 hours.  Radiological Exams on Admission: CT Abdomen Pelvis W Contrast  Result Date: 06/07/2021 CLINICAL DATA:  Pelvic pain, rectal pain EXAM: CT ABDOMEN AND PELVIS WITH CONTRAST TECHNIQUE: Multidetector CT imaging of the abdomen and pelvis was performed using the standard protocol following bolus administration of intravenous contrast. CONTRAST:  55m OMNIPAQUE IOHEXOL 300 MG/ML  SOLN COMPARISON:  11/04/2020 FINDINGS: Lower chest: New small patchy alveolar infiltrates are seen in both lower lobes. There is minimal right pleural effusion. Small pericardial effusion is seen. Pacemaker/defibrillator leads are noted in the heart. There are surgical clips close to the gastroesophageal junction. Hepatobiliary: There is fatty infiltration. There are few small low-density foci in the liver largest measuring 8 mm with no significant interval change. Gallbladder is unremarkable. Pancreas: No focal abnormality is seen Spleen: Unremarkable. 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 There is no hydronephrosis. There are no renal or ureteral stones. Urinary bladder is distended. There is 11 mm smooth marginated low-density structure in the lower pole of right kidney suggesting renal cyst. There is possible 3 mm cyst in the medial midportion of left kidney. Stomach/Bowel: Small hiatal hernia is seen. Small bowel loops are not dilated. Appendix is not seen. There is no pericecal inflammation. No significant wall thickening is seen in colon. Multiple diverticula are seen in colon without signs of focal acute diverticulitis. There is a large multiloculated fluid collection with thick wall in the perirectal and perianal region measuring approximately 6.2 x 5.6 by 5.2 cm. There are no pockets of air in the fluid collection. Presacral soft tissues are unremarkable. Vascular/Lymphatic: Atherosclerotic plaques and calcifications are seen in the aorta and its  major branches. There is 3.2 cm aneurysm in the infrarenal aorta. No significant lymphadenopathy seen. Reproductive: Unremarkable. Other: There is no ascites or pneumoperitoneum. Musculoskeletal: Degenerative changes are noted in the lumbar spine with spinal stenosis and encroachment of neural foramina at multiple levels. IMPRESSION: There is interval appearance  of large lobulated thick-walled fluid collection around the lower rectum and anus along the lateral and posterior walls measuring 6.2 cm in maximum diameter suggesting perirectal/perianal abscess. Surgical consultation should be considered. There are new patchy infiltrates in both lower lobes suggesting multifocal pneumonia or aspiration. There is no evidence of intestinal obstruction or pneumoperitoneum. There is no hydronephrosis. Diverticulosis of colon without signs of focal acute diverticulitis. There is 3.2 cm infrarenal aortic aneurysm. Small hiatal hernia. Small pericardial effusion. Possible hepatic and renal cysts. Electronically Signed   By: Elmer Picker M.D.   On: 06/07/2021 12:46    EKG: Independently reviewed.  Interpretation limited secondary to paced rhythm.  Impression/Recommendations Principal Problem:   Perirectal abscess  Perirectal/perianal abscess -No fever or significant leukocytosis. -Management per primary team (general surgery) -Vancomycin and Zosyn -Continue pain management  Multifocal pneumonia versus aspiration CT showing new patchy infiltrates in both lower lobes suggesting multifocal pneumonia versus aspiration.  Patient is not endorsing any respiratory symptoms.  He is not hypoxic.  COVID and flu negative.  No fever or significant leukocytosis. -Continue antibiotics.  Check procalcitonin level.  Order MRSA PCR screen.  Sputum gram stain and culture.  Strep pneumo and Legionella urinary antigens.  Continuous pulse ox, supplemental oxygen as needed.  Keep n.p.o., SLP eval, aspiration  precautions.  CHB ?A. Fib He has a pacemaker.  A. fib listed in his chronic problem list but not mentioned in previous cardiology notes.  Hypertension Blood pressure elevated with systolic up to 527P. -Oral metoprolol ordered by primary team (pharmacy med rec pending).  Will order IV hydralazine prn SBP >170.   Hyperlipidemia -Pharmacy med rec pending.  Chronic systolic CHF secondary to nonischemic cardiomyopathy He is followed by outpatient cardiology.  Echo done a year ago showing EF 30 to 35% and valvular abnormalities including mild to moderate mitral regurgitation and mild to moderate aortic stenosis.  Patient does not appear volume overloaded at this time. -Check BNP and repeat echocardiogram.  Monitor volume status closely.  COPD Stable.  No signs of acute exacerbation.  No inhalers listed in home medications although pharmacy medication reconciliation is currently pending. -DuoNeb as needed  BPH -Pharmacy med rec pending  PVD -Primary team ordered aspirin.  Pharmacy med rec pending.  Type 2 diabetes Blood glucose 109. -Check A1c.  Sliding scale insulin sensitive every 4 hours as patient is currently NPO.  Pharmacy med rec pending.  CKD stage IIIb Creatinine 1.6, stable. -Continue to monitor renal function  Pericardial effusion CT showing small pericardial effusion.  No signs of cardiac tamponade. -Echo ordered for further evaluation.  Check TSH level.  AAA CT showing 3.2 cm infrarenal aortic aneurysm. -Patient will need close outpatient follow-up for repeat imaging and surveillance.  TRH will followup again tomorrow. Please contact me if I can be of assistance in the meanwhile. Thank you for this consultation.  Time spent: 60 minutes  [facility]  If 7PM-7AM, please contact night-coverage [email or web address] Password [facility] 06/07/2021, 7:41 PM

## 2021-06-07 NOTE — ED Triage Notes (Addendum)
Pt reports his rectum is hurting x 4 months and has not had a BM in 4 days. Stool very hard. No rectal bleeding Not urinating as normal. Reports not taking meds in couple days

## 2021-06-08 ENCOUNTER — Inpatient Hospital Stay (HOSPITAL_COMMUNITY): Payer: Medicare Other | Admitting: Anesthesiology

## 2021-06-08 ENCOUNTER — Other Ambulatory Visit (HOSPITAL_COMMUNITY): Payer: Medicare Other

## 2021-06-08 ENCOUNTER — Encounter (HOSPITAL_COMMUNITY)
Admission: EM | Disposition: A | Discharge status: Home Health | Payer: Self-pay | Source: Home / Self Care | Attending: Internal Medicine

## 2021-06-08 HISTORY — PX: INCISION AND DRAINAGE ABSCESS: SHX5864

## 2021-06-08 LAB — BASIC METABOLIC PANEL
Anion gap: 10 (ref 5–15)
BUN: 30 mg/dL — ABNORMAL HIGH (ref 8–23)
CO2: 25 mmol/L (ref 22–32)
Calcium: 8.4 mg/dL — ABNORMAL LOW (ref 8.9–10.3)
Chloride: 102 mmol/L (ref 98–111)
Creatinine, Ser: 1.58 mg/dL — ABNORMAL HIGH (ref 0.61–1.24)
GFR, Estimated: 42 mL/min — ABNORMAL LOW (ref >60)
Glucose, Bld: 93 mg/dL (ref 70–99)
Potassium: 4.1 mmol/L (ref 3.5–5.1)
Sodium: 137 mmol/L (ref 135–145)

## 2021-06-08 LAB — HEMOGLOBIN A1C
Hgb A1c MFr Bld: 5.9 % — ABNORMAL HIGH (ref 4.8–5.6)
Mean Plasma Glucose: 122.63 mg/dL

## 2021-06-08 LAB — MRSA NEXT GEN BY PCR, NASAL: MRSA by PCR Next Gen: NOT DETECTED

## 2021-06-08 LAB — GLUCOSE, CAPILLARY
Glucose-Capillary: 151 mg/dL — ABNORMAL HIGH (ref 70–99)
Glucose-Capillary: 88 mg/dL (ref 70–99)
Glucose-Capillary: 91 mg/dL (ref 70–99)
Glucose-Capillary: 97 mg/dL (ref 70–99)
Glucose-Capillary: 99 mg/dL (ref 70–99)

## 2021-06-08 LAB — CBC
HCT: 41.8 % (ref 39.0–52.0)
Hemoglobin: 13.6 g/dL (ref 13.0–17.0)
MCH: 28.7 pg (ref 26.0–34.0)
MCHC: 32.5 g/dL (ref 30.0–36.0)
MCV: 88.2 fL (ref 80.0–100.0)
Platelets: 247 10*3/uL (ref 150–400)
RBC: 4.74 MIL/uL (ref 4.22–5.81)
RDW: 13.8 % (ref 11.5–15.5)
WBC: 11.8 10*3/uL — ABNORMAL HIGH (ref 4.0–10.5)
nRBC: 0 % (ref 0.0–0.2)

## 2021-06-08 LAB — TSH: TSH: 2.473 u[IU]/mL (ref 0.350–4.500)

## 2021-06-08 LAB — BRAIN NATRIURETIC PEPTIDE: B Natriuretic Peptide: 995.8 pg/mL — ABNORMAL HIGH (ref 0.0–100.0)

## 2021-06-08 LAB — PROCALCITONIN: Procalcitonin: 0.22 ng/mL

## 2021-06-08 LAB — STREP PNEUMONIAE URINARY ANTIGEN: Strep Pneumo Urinary Antigen: NEGATIVE

## 2021-06-08 SURGERY — INCISION AND DRAINAGE, ABSCESS
Anesthesia: General

## 2021-06-08 MED ORDER — LIDOCAINE 2% (20 MG/ML) 5 ML SYRINGE
INTRAMUSCULAR | Status: DC | PRN
  Administered 2021-06-08: 40 mg via INTRAVENOUS

## 2021-06-08 MED ORDER — ESMOLOL HCL 100 MG/10ML IV SOLN
INTRAVENOUS | Status: AC
  Filled 2021-06-08: qty 10

## 2021-06-08 MED ORDER — VANCOMYCIN HCL 1250 MG/250ML IV SOLN: 1250.0000 mg | INTRAVENOUS | Status: DC

## 2021-06-08 MED ORDER — SODIUM CHLORIDE 0.9 % IV SOLN
500.0000 mg | Freq: Every day | INTRAVENOUS | Status: DC
  Administered 2021-06-08: 500 mg via INTRAVENOUS
  Filled 2021-06-08 (×2): qty 5

## 2021-06-08 MED ORDER — ETOMIDATE 2 MG/ML IV SOLN
INTRAVENOUS | Status: AC
  Filled 2021-06-08: qty 10

## 2021-06-08 MED ORDER — FENTANYL CITRATE (PF) 100 MCG/2ML IJ SOLN
INTRAMUSCULAR | Status: DC | PRN
  Administered 2021-06-08: 100 ug via INTRAVENOUS

## 2021-06-08 MED ORDER — FENTANYL CITRATE (PF) 100 MCG/2ML IJ SOLN
INTRAMUSCULAR | Status: AC
  Filled 2021-06-08: qty 2

## 2021-06-08 MED ORDER — ROSUVASTATIN CALCIUM 20 MG PO TABS
20.0000 mg | ORAL_TABLET | Freq: Every day | ORAL | Status: DC
  Administered 2021-06-09: 20 mg via ORAL
  Filled 2021-06-08: qty 1

## 2021-06-08 MED ORDER — PHENYLEPHRINE HCL (PRESSORS) 10 MG/ML IV SOLN
INTRAVENOUS | Status: AC
  Filled 2021-06-08: qty 1

## 2021-06-08 MED ORDER — ONDANSETRON HCL 4 MG/2ML IJ SOLN
INTRAMUSCULAR | Status: AC
  Filled 2021-06-08: qty 2

## 2021-06-08 MED ORDER — LIDOCAINE 2% (20 MG/ML) 5 ML SYRINGE
INTRAMUSCULAR | Status: AC
  Filled 2021-06-08: qty 5

## 2021-06-08 MED ORDER — ESMOLOL HCL 100 MG/10ML IV SOLN
INTRAVENOUS | Status: DC | PRN
  Administered 2021-06-08: 10 mg via INTRAVENOUS

## 2021-06-08 MED ORDER — ROCURONIUM BROMIDE 10 MG/ML (PF) SYRINGE
PREFILLED_SYRINGE | INTRAVENOUS | Status: AC
  Filled 2021-06-08: qty 10

## 2021-06-08 MED ORDER — MAGNESIUM OXIDE -MG SUPPLEMENT 400 (240 MG) MG PO TABS
200.0000 mg | ORAL_TABLET | Freq: Two times a day (BID) | ORAL | Status: DC
  Administered 2021-06-08 – 2021-06-10 (×4): 200 mg via ORAL
  Filled 2021-06-08 (×5): qty 1

## 2021-06-08 MED ORDER — ROCURONIUM BROMIDE 10 MG/ML (PF) SYRINGE
PREFILLED_SYRINGE | INTRAVENOUS | Status: DC | PRN
  Administered 2021-06-08: 40 mg via INTRAVENOUS

## 2021-06-08 MED ORDER — SUGAMMADEX SODIUM 500 MG/5ML IV SOLN
INTRAVENOUS | Status: DC | PRN
  Administered 2021-06-08: 200 mg via INTRAVENOUS

## 2021-06-08 MED ORDER — 0.9 % SODIUM CHLORIDE (POUR BTL) OPTIME
TOPICAL | Status: DC | PRN
  Administered 2021-06-08: 1000 mL

## 2021-06-08 MED ORDER — ETOMIDATE 2 MG/ML IV SOLN
INTRAVENOUS | Status: DC | PRN
  Administered 2021-06-08: 12 mg via INTRAVENOUS

## 2021-06-08 MED ORDER — ONDANSETRON HCL 4 MG/2ML IJ SOLN
INTRAMUSCULAR | Status: DC | PRN
  Administered 2021-06-08: 4 mg via INTRAVENOUS

## 2021-06-08 MED ORDER — FUROSEMIDE 40 MG PO TABS
40.0000 mg | ORAL_TABLET | Freq: Every day | ORAL | Status: DC
  Administered 2021-06-09 – 2021-06-10 (×2): 40 mg via ORAL
  Filled 2021-06-08 (×2): qty 1

## 2021-06-08 MED ORDER — PROPOFOL 10 MG/ML IV BOLUS
INTRAVENOUS | Status: AC
  Filled 2021-06-08: qty 20

## 2021-06-08 SURGICAL SUPPLY — 28 items
APL PRP STRL LF DISP 70% ISPRP (MISCELLANEOUS) ×1
BAG COUNTER SPONGE SURGICOUNT (BAG) IMPLANT
BAG SPNG CNTER NS LX DISP (BAG)
BLADE SURG SZ11 CARB STEEL (BLADE) ×3 IMPLANT
CHLORAPREP W/TINT 26 (MISCELLANEOUS) ×3 IMPLANT
COVER SURGICAL LIGHT HANDLE (MISCELLANEOUS) ×3 IMPLANT
DECANTER SPIKE VIAL GLASS SM (MISCELLANEOUS) IMPLANT
DRAIN PENROSE 0.25X18 (DRAIN) ×1 IMPLANT
DRAPE LAPAROSCOPIC ABDOMINAL (DRAPES) IMPLANT
DRAPE LAPAROTOMY TRNSV 102X78 (DRAPES) IMPLANT
DRSG PAD ABDOMINAL 8X10 ST (GAUZE/BANDAGES/DRESSINGS) ×1 IMPLANT
ELECT REM PT RETURN 15FT ADLT (MISCELLANEOUS) ×3 IMPLANT
GAUZE SPONGE 4X4 12PLY STRL (GAUZE/BANDAGES/DRESSINGS) ×1 IMPLANT
GLOVE SURG POLYISO LF SZ7 (GLOVE) ×3 IMPLANT
GLOVE SURG UNDER POLY LF SZ7 (GLOVE) ×3 IMPLANT
GOWN STRL REUS W/TWL LRG LVL3 (GOWN DISPOSABLE) ×3 IMPLANT
GOWN STRL REUS W/TWL XL LVL3 (GOWN DISPOSABLE) ×3 IMPLANT
KIT BASIN OR (CUSTOM PROCEDURE TRAY) ×3 IMPLANT
KIT TURNOVER KIT A (KITS) IMPLANT
PACK GENERAL/GYN (CUSTOM PROCEDURE TRAY) ×3 IMPLANT
SURGILUBE 2OZ TUBE FLIPTOP (MISCELLANEOUS) IMPLANT
SUT MNCRL AB 4-0 PS2 18 (SUTURE) IMPLANT
SUT SILK 2 0 SH (SUTURE) ×1 IMPLANT
SWAB COLLECTION DEVICE MRSA (MISCELLANEOUS) IMPLANT
SWAB CULTURE ESWAB REG 1ML (MISCELLANEOUS) ×2 IMPLANT
TAPE CLOTH SURG 4X10 WHT LF (GAUZE/BANDAGES/DRESSINGS) IMPLANT
TOWEL OR 17X26 10 PK STRL BLUE (TOWEL DISPOSABLE) ×3 IMPLANT
TOWEL OR NON WOVEN STRL DISP B (DISPOSABLE) ×3 IMPLANT

## 2021-06-08 NOTE — Progress Notes (Signed)
Patient is able to tell RN that he is having rectal surgery for pain. He does not know  Dr Kieth Brightly is Psychologist, sport and exercise; however, he has not seen him yet.  Oriented to month and year, needs prompting on the date. Thought it was 06/07/21.

## 2021-06-08 NOTE — Transfer of Care (Signed)
Immediate Anesthesia Transfer of Care Note  Patient: Daniel Woodard  Procedure(s) Performed: INCISION AND DRAINAGE ABSCESS  Patient Location: PACU  Anesthesia Type:General  Level of Consciousness: sedated  Airway & Oxygen Therapy: Patient connected to face mask oxygen  Post-op Assessment: Report given to RN and Post -op Vital signs reviewed and stable  Post vital signs: Reviewed and stable  Last Vitals:  Vitals Value Taken Time  BP 127/89 06/08/21 1406  Temp    Pulse 85 06/08/21 1409  Resp 15 06/08/21 1409  SpO2 99 % 06/08/21 1409  Vitals shown include unvalidated device data.  Last Pain:  Vitals:   06/08/21 0958  TempSrc: Oral  PainSc:          Complications: No notable events documented.

## 2021-06-08 NOTE — Anesthesia Preprocedure Evaluation (Addendum)
Anesthesia Evaluation  Patient identified by MRN, date of birth, ID band  Reviewed: Allergy & Precautions, H&P , NPO status , Patient's Chart, lab work & pertinent test results, reviewed documented beta blocker date and time   Airway Mallampati: II  TM Distance: >3 FB Neck ROM: Full    Dental no notable dental hx. (+) Edentulous Upper, Edentulous Lower, Dental Advisory Given   Pulmonary COPD,  COPD inhaler, former smoker,    Pulmonary exam normal breath sounds clear to auscultation       Cardiovascular hypertension, Pt. on home beta blockers and Pt. on medications + Peripheral Vascular Disease (1-49% RICA occlusion ) and +CHF  + dysrhythmias (CHB s/p PPM) Atrial Fibrillation + pacemaker + Valvular Problems/Murmurs (mild/mod MR) MR  Rhythm:Regular Rate:Normal  TTE 2022 1. LV function is moderately reduced ~30-35%. The mid to distal seputm  into apex appears akinetic with global hypokinesis in other segments. The  RWMA may be pacer related but are a bit more than expected, and cannot  exclude LAD infarction.  2. Left ventricular ejection fraction, by estimation, is 30 to 35%. The  left ventricle has moderately decreased function. The left ventricle  demonstrates regional wall motion abnormalities (see scoring  diagram/findings for description). There is mild  concentric left ventricular hypertrophy. Left ventricular diastolic  function could not be evaluated.  3. Right ventricular systolic function is mildly reduced. The right  ventricular size is moderately enlarged. There is moderately elevated  pulmonary artery systolic pressure. The estimated right ventricular  systolic pressure is 45.4 mmHg.  4. There appears to be mild to moderate calcific mitral stenosis, but  doppler evaluation across the valve was not performed and severity cannot  be adequately assessed. Noted to be at least mild on last study. The  mitral valve is  degenerative. Mild to  moderate mitral valve regurgitation. Severe mitral annular calcification.  5. Mild to moderate AS is present. V max 1.8 m/s, MG 8 mmHG, AVA 1.22  cm2, DI 0.39. SV index low due to cardiomyopathy (23 cc/m2). Visually  there is at least moderate AS and AVA/DI support this. The aortic valve is  calcified. Aortic valve regurgitation  is not visualized. Mild to moderate aortic valve stenosis.  6. The inferior vena cava is dilated in size with <50% respiratory  variability, suggesting right atrial pressure of 15 mmHg.   Cath 2022 Prox Cx lesion is 25% stenosed. Mid LAD lesion is 50-60% stenosed. Heavily calcified at the origin of the diagonal. 2nd Diag lesion is 50% stenosed. LV end diastolic pressure is normal. There is no aortic valve stenosis.   Medical therapy given lack of angina.  Degree of LAD disease should not cause akinesis of apex.    Neuro/Psych PSYCHIATRIC DISORDERS Depression Dementia negative neurological ROS     GI/Hepatic negative GI ROS, Neg liver ROS,   Endo/Other  diabetes  Renal/GU Renal InsufficiencyRenal diseaseLab Results      Component                Value               Date                      CREATININE               1.58 (H)            06/08/2021  BUN                      30 (H)              06/08/2021                NA                       137                 06/08/2021                K                        4.1                 06/08/2021                CL                       102                 06/08/2021                CO2                      25                  06/08/2021            Bladder dysfunction:     negative genitourinary   Musculoskeletal negative musculoskeletal ROS (+)   Abdominal   Peds  Hematology negative hematology ROS (+)   Anesthesia Other Findings CT showing findings suggestive of perirectal/perianal abscess.  Also showing new patchy infiltrates in both lower lobes suggesting  multifocal pneumonia or aspiration.   Reproductive/Obstetrics negative OB ROS                          Anesthesia Physical Anesthesia Plan  ASA: 3  Anesthesia Plan: General   Post-op Pain Management: Tylenol PO (pre-op) and Gabapentin PO (pre-op)   Induction: Intravenous and Rapid sequence  PONV Risk Score and Plan: 2 and Dexamethasone, Ondansetron and Treatment may vary due to age or medical condition  Airway Management Planned: Oral ETT  Additional Equipment:   Intra-op Plan:   Post-operative Plan: Extubation in OR  Informed Consent: I have reviewed the patients History and Physical, chart, labs and discussed the procedure including the risks, benefits and alternatives for the proposed anesthesia with the patient or authorized representative who has indicated his/her understanding and acceptance.   Patient has DNR.   Dental advisory given  Plan Discussed with: CRNA  Anesthesia Plan Comments:       Anesthesia Quick Evaluation

## 2021-06-08 NOTE — Op Note (Signed)
Preoperative diagnosis: perirectal abscess  Postoperative diagnosis: same   Procedure: incision and drainage of perirectal abscess  Surgeon: Gurney Maxin, M.D.  Asst: Radonna Ricker, MD (resident  Anesthesia: general  Indications for procedure: Daniel Woodard is a 88 y.o. year old male with symptoms of perianal pain. On work up he had concerns for abscess and was brought to surgery.  Description of procedure: The patient was brought into the operative suite. Anesthesia was administered with General endotracheal anesthesia. WHO checklist was applied. The patient was then placed in lithotomy position. The area was prepped and draped in the usual sterile fashion.  Exam revealed a high perirectal abscess and fluctuance could be palpated along the posterior perianal area during transrectal pressure.   The location of greatest induration and fluctuance was identified A stab incision was made with purulent output. and Cultures were collected and sent. The cavity was probed with instrument and pockets were opened. The area was palpated to express additional purulence. The abscess went into the posterior rectal space and across the midline. A counterincision was made on the right side. A penrose was looped through and sutured to itself with 2-0 silk. The cavity was irrigated with saline. Hemostasis was applied with cautery. Dressing was put in place the patient tolerated the procedure well and was transferred to pacu. All counts were correct.  Findings: odorous abscess of the high posterior rectal space  Specimen: abscess of perirectal abscess  Implant: penrose   Blood loss: 20 ml  Local anesthesia: none  Complications: none  Gurney Maxin, M.D. General, Bariatric, & Minimally Invasive Surgery [facility]

## 2021-06-08 NOTE — Progress Notes (Signed)
Pt is urinating minimal amout, MD made rounds and ordered to insert foley cath; bladder scan done with 985ml urine retention; foley inserted f14, pt tolerated the procedure, draining clear yellow urine; will continue to monitor pt.

## 2021-06-08 NOTE — Progress Notes (Signed)
Progress Note  Day of Surgery  Subjective: Pt reports intermittent rectal pain for several months. Constipation over the last few days. Some soreness from laying on backside.   Objective: Vital signs in last 24 hours: Temp:  [97 F (36.1 C)-98.1 F (36.7 C)] 97.5 F (36.4 C) (01/10 0425) Pulse Rate:  [64-107] 89 (01/10 0425) Resp:  [16-20] 16 (01/10 0425) BP: (127-189)/(63-92) 158/69 (01/10 0425) SpO2:  [93 %-98 %] 96 % (01/10 0425) Weight:  [76.7 kg] 76.7 kg (01/09 0949) Last BM Date: 06/05/21  Intake/Output from previous day: 01/09 0701 - 01/10 0700 In: 1193.6 [P.O.:475; I.V.:84.1; IV Piggyback:634.4] Out: 2050 [Urine:2050] Intake/Output this shift: No intake/output data recorded.  PE: General: pleasant, WD, elderly male who is laying in bed in NAD HEENT: head is normocephalic, atraumatic.  Sclera are noninjected. Ears and nose without any masses or lesions.  Mouth is pink and moist Heart: regular, rate, and rhythm.  Normal s1,s2. No obvious murmurs, gallops, or rubs noted.  Palpable radial and pedal pulses bilaterally Lungs: CTAB, no wheezes, rhonchi, or rales noted.  Respiratory effort nonlabored Abd: soft, NT, ND, +BS, no masses, hernias, or organomegaly GU: minimal external signs of infection, some firmness of left buttock  MS: all 4 extremities are symmetrical with no cyanosis, clubbing, or edema. Skin: warm and dry with no masses, lesions, or rashes Neuro: Cranial nerves 2-12 grossly intact, sensation is normal throughout Psych: A&Ox3 with an appropriate affect.    Lab Results:  Recent Labs    06/07/21 1122 06/08/21 0407  WBC 11.8* 11.8*  HGB 13.4 13.6  HCT 40.4 41.8  PLT 264 247   BMET Recent Labs    06/07/21 1122 06/08/21 0407  NA 133* 137  K 4.4 4.1  CL 99 102  CO2 21* 25  GLUCOSE 109* 93  BUN 30* 30*  CREATININE 1.60* 1.58*  CALCIUM 8.6* 8.4*   PT/INR No results for input(s): LABPROT, INR in the last 72 hours. CMP     Component Value  Date/Time   NA 137 06/08/2021 0407   NA 138 08/11/2020 0959   K 4.1 06/08/2021 0407   CL 102 06/08/2021 0407   CO2 25 06/08/2021 0407   GLUCOSE 93 06/08/2021 0407   BUN 30 (H) 06/08/2021 0407   BUN 43 (H) 08/11/2020 0959   CREATININE 1.58 (H) 06/08/2021 0407   CREATININE 1.39 (H) 07/05/2019 1538   CALCIUM 8.4 (L) 06/08/2021 0407   PROT 7.2 06/07/2021 1122   ALBUMIN 3.2 (L) 06/07/2021 1122   AST 31 06/07/2021 1122   ALT 29 06/07/2021 1122   ALKPHOS 81 06/07/2021 1122   BILITOT 1.2 06/07/2021 1122   GFRNONAA 42 (L) 06/08/2021 0407   GFRAA 46 (L) 03/07/2019 0322   Lipase     Component Value Date/Time   LIPASE 35 10/03/2020 1730       Studies/Results: DG Chest 2 View  Result Date: 06/07/2021 CLINICAL DATA:  Concern for pneumonia. EXAM: CHEST - 2 VIEW COMPARISON:  Chest x-ray 08/14/2020.  Chest CT 07/17/2019. FINDINGS: There are minimal fibrotic changes in the lung bases similar to the prior study. There is no new lung consolidation, pleural effusion or pneumothorax identified. There are atherosclerotic calcifications of the aorta. Heart is borderline enlarged unchanged. ICD is again noted. No acute fractures are seen. IMPRESSION: 1.  No acute cardiopulmonary process. 2.  Stable fibrotic changes in the lung bases. Electronically Signed   By: Ronney Asters M.D.   On: 06/07/2021 21:43   CT  Abdomen Pelvis W Contrast  Result Date: 06/07/2021 CLINICAL DATA:  Pelvic pain, rectal pain EXAM: CT ABDOMEN AND PELVIS WITH CONTRAST TECHNIQUE: Multidetector CT imaging of the abdomen and pelvis was performed using the standard protocol following bolus administration of intravenous contrast. CONTRAST:  22mL OMNIPAQUE IOHEXOL 300 MG/ML  SOLN COMPARISON:  11/04/2020 FINDINGS: Lower chest: New small patchy alveolar infiltrates are seen in both lower lobes. There is minimal right pleural effusion. Small pericardial effusion is seen. Pacemaker/defibrillator leads are noted in the heart. There are surgical  clips close to the gastroesophageal junction. Hepatobiliary: There is fatty infiltration. There are few small low-density foci in the liver largest measuring 8 mm with no significant interval change. Gallbladder is unremarkable. Pancreas: No focal abnormality is seen Spleen: Unremarkable. 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 There is no hydronephrosis. There are no renal or ureteral stones. Urinary bladder is distended. There is 11 mm smooth marginated low-density structure in the lower pole of right kidney suggesting renal cyst. There is possible 3 mm cyst in the medial midportion of left kidney. Stomach/Bowel: Small hiatal hernia is seen. Small bowel loops are not dilated. Appendix is not seen. There is no pericecal inflammation. No significant wall thickening is seen in colon. Multiple diverticula are seen in colon without signs of focal acute diverticulitis. There is a large multiloculated fluid collection with thick wall in the perirectal and perianal region measuring approximately 6.2 x 5.6 by 5.2 cm. There are no pockets of air in the fluid collection. Presacral soft tissues are unremarkable. Vascular/Lymphatic: Atherosclerotic plaques and calcifications are seen in the aorta and its major branches. There is 3.2 cm aneurysm in the infrarenal aorta. No significant lymphadenopathy seen. Reproductive: Unremarkable. Other: There is no ascites or pneumoperitoneum. Musculoskeletal: Degenerative changes are noted in the lumbar spine with spinal stenosis and encroachment of neural foramina at multiple levels. IMPRESSION: There is interval appearance of large lobulated thick-walled fluid collection around the lower rectum and anus along the lateral and posterior walls measuring 6.2 cm in maximum diameter suggesting perirectal/perianal abscess. Surgical consultation should be considered. There are new patchy infiltrates in both lower lobes suggesting multifocal  pneumonia or aspiration. There is no evidence of intestinal obstruction or pneumoperitoneum. There is no hydronephrosis. Diverticulosis of colon without signs of focal acute diverticulitis. There is 3.2 cm infrarenal aortic aneurysm. Small hiatal hernia. Small pericardial effusion. Possible hepatic and renal cysts. Electronically Signed   By: Elmer Picker M.D.   On: 06/07/2021 12:46    Anti-infectives: Anti-infectives (From admission, onward)    Start     Dose/Rate Route Frequency Ordered Stop   06/10/21 1200  vancomycin (VANCOREADY) IVPB 1250 mg/250 mL        1,250 mg 166.7 mL/hr over 90 Minutes Intravenous Every 36 hours 06/08/21 0151     06/08/21 0600  ceFAZolin (ANCEF) IVPB 2g/100 mL premix       See Hyperspace for full Linked Orders Report.   2 g 200 mL/hr over 30 Minutes Intravenous On call to O.R. 06/07/21 2026 06/09/21 0559   06/08/21 0600  metroNIDAZOLE (FLAGYL) IVPB 500 mg       See Hyperspace for full Linked Orders Report.   500 mg 100 mL/hr over 60 Minutes Intravenous On call to O.R. 06/07/21 2026 06/09/21 0559   06/07/21 2230  vancomycin (VANCOREADY) IVPB 1500 mg/300 mL        1,500 mg 150 mL/hr over 120 Minutes Intravenous  Once 06/07/21 2044 06/08/21 0248  06/07/21 2200  piperacillin-tazobactam (ZOSYN) IVPB 3.375 g        3.375 g 12.5 mL/hr over 240 Minutes Intravenous Every 8 hours 06/07/21 2034 06/12/21 2159   06/07/21 1530  azithromycin (ZITHROMAX) 500 mg in sodium chloride 0.9 % 250 mL IVPB        500 mg 250 mL/hr over 60 Minutes Intravenous  Once 06/07/21 1523 06/07/21 1710   06/07/21 1315  cefTRIAXone (ROCEPHIN) 1 g in sodium chloride 0.9 % 100 mL IVPB  Status:  Discontinued        1 g 200 mL/hr over 30 Minutes Intravenous Every 24 hours 06/07/21 1302 06/07/21 2034   06/07/21 1315  metroNIDAZOLE (FLAGYL) IVPB 500 mg        500 mg 100 mL/hr over 60 Minutes Intravenous  Once 06/07/21 1302 06/07/21 1505        Assessment/Plan Perirectal abscess - to OR  today for I&D - WBC stable at 11 - minimal external signs of infection  FEN: NPO, IVF VTE: SCDs ID: [email or web address] Foley: present   COPD CHF Pacemaker present  T2DM HTN HLD Peripheral vascular disease ?urinary retention - foley in place  LOS: 1 day   I reviewed nursing notes, hospitalist notes, last 24 h vitals and pain scores, last 48 h intake and output, last 24 h labs and trends, and last 24 h imaging results.  This care required low level of medical decision making.    Norm Parcel, [facility] 06/08/2021, 9:46 AM Please see Amion for pager number during day hours 7:00am-4:30pm

## 2021-06-08 NOTE — Anesthesia Procedure Notes (Signed)
Procedure Name: Intubation Date/Time: 06/08/2021 1:10 PM Performed by: Lavina Hamman, CRNA Pre-anesthesia Checklist: Patient identified, Emergency Drugs available, Suction available and Patient being monitored Patient Re-evaluated:Patient Re-evaluated prior to induction Oxygen Delivery Method: Circle System Utilized Preoxygenation: Pre-oxygenation with 100% oxygen Induction Type: IV induction Ventilation: Mask ventilation without difficulty Laryngoscope Size: Mac and 4 Grade View: Grade I Tube type: Oral Number of attempts: 1 Airway Equipment and Method: Stylet and Oral airway Placement Confirmation: ETT inserted through vocal cords under direct vision, positive ETCO2 and breath sounds checked- equal and bilateral Secured at: 21 cm Tube secured with: Tape Dental Injury: Teeth and Oropharynx as per pre-operative assessment

## 2021-06-08 NOTE — Plan of Care (Signed)
°  Problem: Skin Integrity: Goal: Risk for impaired skin integrity will decrease Outcome: Progressing   Problem: Pain Managment: Goal: General experience of comfort will improve Outcome: Progressing   Problem: Elimination: Goal: Will not experience complications related to urinary retention Outcome: Progressing   Problem: Coping: Goal: Level of anxiety will decrease Outcome: Progressing   Problem: Clinical Measurements: Goal: Respiratory complications will improve Outcome: Progressing

## 2021-06-08 NOTE — Progress Notes (Signed)
SLP Cancellation Note  Patient Details Name: Daniel Woodard MRN: [id number] DOB: [birth year]   Cancelled treatment:       Reason Eval/Treat Not Completed: Other (comment);Medical issues which prohibited therapy (pt npo for surgery, will continue efforts)  Kathleen Lime, MS [facility] SLP [place] Office [phone] Cell [phone]  Macario Golds 06/08/2021, 9:39 AM

## 2021-06-08 NOTE — Progress Notes (Signed)
PROGRESS NOTE    Daniel Woodard  WJX:[id number] DOB: [birth year] DOA: 06/07/2021 PCP: Biagio Borg, MD    Brief Narrative:  Daniel Woodard is a 88 year old male with past medical history significant for paroxysmal atrial fibrillation, complete heart block s/p PPM, chronic systolic congestive heart failure, nonischemic cardiomyopathy, COPD, BPH, CAS s/p CEA, CKD stage IIIb, type 2 diabetes mellitus, essential hypertension, hyperlipidemia who initially presented to [facility] ED with complaints of constipation and rectal pain.  Patient reports onset for several years; with recent worsening in which she has been having difficulty with bowel movements and sitting.  He has been taking over-the-counter laxatives without much improvement.  Denies rectal bleeding or discharge.  No other complaints at this time.  Denies fever/chills, no cough/shortness of breath, no chest pain, no nausea/vomiting, no abdominal pain, no dysuria, no urinary frequency/urgency, no lower extremity edema.  In the ED, temperature 97.7 F, HR 64, RR 20, BP 189/92, SPO2 95% on room air. Sodium 133, potassium 4.4, chloride 99, CO2 21, glucose 109, BUN 30, creatinine 1.60, AST 31, ALT 29, total bilirubin 1.2.  WBC 11.8, hemoglobin 13.4, platelets 264.  Procalcitonin 0.22.  BNP 995.8.  COVID-19 PCR negative.  Influenza A/B PCR negative.  Urinalysis with 40 ketones, otherwise unrevealing.  FOBT positive.  Chest x-ray with no acute cardiopulmonary disease process, stable fibrotic changes lung bases.  CT abdomen/pelvis with interval appearance large lobulated thick-walled fluid collection lower rectum/anus along the lateral and posterior walls measuring 2.6 cm in maximum diameter consistent with perirectal/perianal abscess; also new patchy infiltrates bilateral lower lobes suggestive of multifocal pneumonia versus aspiration; no evidence of intestinal obstruction or pneumoperitoneum, no hydronephrosis.  General surgery was consulted.  Hospital  service consulted for further evaluation management.  Assessment & Plan:   Principal Problem:   Perirectal abscess Active Problems:   Diabetes ([place])   Hyperlipidemia   Depression   Essential hypertension   COPD GOLD II / AB component    Benign prostatic hyperplasia   Biventricular cardiac pacemaker in situ   CHF (congestive heart failure) (HCC)   CKD (chronic kidney disease), stage III (HCC)   Constipation, chronic   Dementia (HCC)   Decreased cardiac ejection fraction   Perirectal/perianal abscess Patient presenting to the ED with progressive constipation and rectal pain.  CT abdomen/pelvis with interval appearance large lobulated thick-walled fluid collection lower rectum/anus along the lateral and posterior walls measuring 2.6 cm in maximum diameter consistent with perirectal/perianal abscess.  Procalcitonin 0.22.  No blood cultures ordered on admission prior to antibiotic initiation. --General surgery following, appreciate assistance --Incision and drainage of abscess planned by Dr. Kieth Brightly today --MRSA PCR negative, will discontinue vancomycin --Continue antibiotics Zosyn --Repeat CBC in the a.m.  Community-acquired versus aspiration pneumonia versus atelectasis Patient is afebrile, slight leukocytosis but likely related to perirectal/perianal abscess.  Oxygenating well on room air.  Chest x-ray with no vascular congestion or consolidation.  CT abdomen/pelvis with incidental finding of multifocal pneumonia versus aspiration bilateral lower lobes.  COVID-19 PCR and influenza A PCR negative.  Blood cultures were not performed on admission.  Strep pneumo urinary antigen negative. --Legionella urinary antigen: Pending --Sputum culture ordered but not collected as of yet --SLP evaluation postoperatively --Continue antibiotics as above for now with Zosyn --Continue azithromycin for atypical coverage --Aspiration precautions --Incentive spirometry/flutter valve --CBC in  am  Hyponatremia: Resolved Sodium 133 on admission, likely secondary to dehydration in the setting of chronic constipation. --Na 133>137 --Repeat BMP in the a.m.  Type  2 diabetes mellitus Hemoglobin A1c 5.9, well controlled.  Diet controlled at home. --CBG as needed  Paroxysmal atrial fibrillation Complete heart block s/p PPM Atrial fibrillation listed as a chronic problem but not mentioned in his previous cardiology notes.  Patient has pacemaker in place.  EKG with paced rhythm, rate 93.  Not on anticoagulation outpatient. --Continue metoprolol succinate 12.5 mg p.o. daily --Continue to monitor on telemetry  Chronic systolic congestive heart failure, compensated TTE 06/02/2020 with LVEF 30-35%, distal septum to apex akinetic with global hypokinesis in other segments, LV moderately decreased function, with regional wall motion abnormalities, mild concentric LVH, moderate MR, moderate aortic stenosis, IVC dilated.  Elevated BNP on admission, chest x-ray and CT with no findings of vascular congestion.  It appears euvolemic on physical exam. --Repeat TTE: Pending --Continue metoprolol succinate 12.5 mg p.o. daily --Resume home furosemide postoperatively --Strict I's and O's and daily weights  GERD: Continue PPI  Hyperlipidemia: --Resume Crestor postoperatively  Social: Patient currently lives alone in a townhouse.  No significant stairs to navigate.  Patient has friends who check on him pretty frequently. --PT evaluation postoperatively to assess for discharge needs   DVT prophylaxis: SCD's Start: 06/07/21 2025   Code Status: Prior Family Communication: No family present at bedside this morning  Disposition Plan:  Level of care: Telemetry Status is: Inpatient  Remains inpatient appropriate because: Pending surgical invention for perirectal/perianal abscess this afternoon    Consultants:  General surgery, Dr. Kieth Brightly  Procedures:  TTE: Pending Incision and drainage  perirectal/perianal abscess: Pending  Antimicrobials:  Vancomycin 1/9 - 1/10 Metronidazole 1/9-1/9 Azithromycin 1/9 >> Zosyn 1/9>>    Subjective: Patient seen examined bedside, resting comfortably.  In good spirits.  Awaiting for surgical intervention regarding perirectal/anal abscess this afternoon.  No specific complaints or concerns at this time.  Denies headache, no visual changes, no chest pain, no palpitations, no shortness of breath, no abdominal pain, no weakness, no fatigue, no paresthesias.  No acute events overnight per nursing staff.  Objective: Vitals:   06/07/21 2053 06/08/21 0124 06/08/21 0425 06/08/21 0958  BP: (!) 162/87 (!) 147/76 (!) 158/69 (!) 155/70  Pulse: 100 83 89 81  Resp: 16 16 16 14   Temp: 98.1 F (36.7 C) (!) 97.3 F (36.3 C) (!) 97.5 F (36.4 C) (!) 97.5 F (36.4 C)  TempSrc: Oral Oral Oral Oral  SpO2: 96% 96% 96% 97%  Weight:      Height:        Intake/Output Summary (Last 24 hours) at 06/08/2021 1336 Last data filed at 06/08/2021 0600 Gross per 24 hour  Intake 1193.55 ml  Output 2050 ml  Net -856.45 ml   Filed Weights   06/07/21 0949  Weight: 76.7 kg    Examination:  General exam: Appears calm and comfortable, elderly in appearance Respiratory system: Clear to auscultation. Respiratory effort normal.  On room air Cardiovascular system: S1 & S2 heard, RRR. No JVD, murmurs, rubs, gallops or clicks. No pedal edema. Gastrointestinal system: Abdomen is nondistended, soft and nontender. No organomegaly or masses felt. Normal bowel sounds heard. Central nervous system: Alert and oriented. No focal neurological deficits. Extremities: Symmetric 5 x 5 power. Skin: No rashes, lesions or ulcers Psychiatry: Judgement and insight appear normal. Mood & affect appropriate.     Data Reviewed: I have personally reviewed following labs and imaging studies  CBC: Recent Labs  Lab 06/07/21 1122 06/08/21 0407  WBC 11.8* 11.8*  NEUTROABS 10.1*  --    HGB 13.4 13.6  HCT 40.4 41.8  MCV 86.1 88.2  PLT 264 563   Basic Metabolic Panel: Recent Labs  Lab 06/07/21 1122 06/08/21 0407  NA 133* 137  K 4.4 4.1  CL 99 102  CO2 21* 25  GLUCOSE 109* 93  BUN 30* 30*  CREATININE 1.60* 1.58*  CALCIUM 8.6* 8.4*   GFR: Estimated Creatinine Clearance: 34.4 mL/min (A) (by C-G formula based on SCr of 1.58 mg/dL (H)). Liver Function Tests: Recent Labs  Lab 06/07/21 1122  AST 31  ALT 29  ALKPHOS 81  BILITOT 1.2  PROT 7.2  ALBUMIN 3.2*   No results for input(s): LIPASE, AMYLASE in the last 168 hours. No results for input(s): AMMONIA in the last 168 hours. Coagulation Profile: No results for input(s): INR, PROTIME in the last 168 hours. Cardiac Enzymes: No results for input(s): CKTOTAL, CKMB, CKMBINDEX, TROPONINI in the last 168 hours. BNP (last 3 results) No results for input(s): PROBNP in the last 8760 hours. HbA1C: Recent Labs    06/08/21 0407  HGBA1C 5.9*   CBG: Recent Labs  Lab 06/07/21 2137 06/08/21 0001 06/08/21 0338 06/08/21 0746  GLUCAP 99 99 91 88   Lipid Profile: No results for input(s): CHOL, HDL, LDLCALC, TRIG, CHOLHDL, LDLDIRECT in the last 72 hours. Thyroid Function Tests: Recent Labs    06/08/21 0407  TSH 2.473   Anemia Panel: No results for input(s): VITAMINB12, FOLATE, FERRITIN, TIBC, IRON, RETICCTPCT in the last 72 hours. Sepsis Labs: Recent Labs  Lab 06/08/21 0407  PROCALCITON 0.22    Recent Results (from the past 240 hour(s))  Resp Panel by RT-PCR (Flu A&B, Covid) Nasopharyngeal Swab     Status: None   Collection Time: 06/07/21  1:18 PM   Specimen: Nasopharyngeal Swab; Nasopharyngeal(NP) swabs in vial transport medium  Result Value Ref Range Status   SARS Coronavirus 2 by RT PCR NEGATIVE NEGATIVE Final    Comment: (NOTE) SARS-CoV-2 target nucleic acids are NOT DETECTED.  The SARS-CoV-2 RNA is generally detectable in upper respiratory specimens during the acute phase of infection. The  lowest concentration of SARS-CoV-2 viral copies this assay can detect is 138 copies/mL. A negative result does not preclude SARS-Cov-2 infection and should not be used as the sole basis for treatment or other patient management decisions. A negative result may occur with  improper specimen collection/handling, submission of specimen other than nasopharyngeal swab, presence of viral mutation(s) within the areas targeted by this assay, and inadequate number of viral copies(<138 copies/mL). A negative result must be combined with clinical observations, patient history, and epidemiological information. The expected result is Negative.  Fact Sheet for Patients:  EntrepreneurPulse.com.au  Fact Sheet for Healthcare Providers:  IncredibleEmployment.be  This test is no t yet approved or cleared by the [place] FDA and  has been authorized for detection and/or diagnosis of SARS-CoV-2 by FDA under an Emergency Use Authorization (EUA). This EUA will remain  in effect (meaning this test can be used) for the duration of the COVID-19 declaration under Section 564(b)(1) of the Act, 21 U.S.C.section 360bbb-3(b)(1), unless the authorization is terminated  or revoked sooner.       Influenza A by PCR NEGATIVE NEGATIVE Final   Influenza B by PCR NEGATIVE NEGATIVE Final    Comment: (NOTE) The Xpert Xpress SARS-CoV-2/FLU/RSV plus assay is intended as an aid in the diagnosis of influenza from Nasopharyngeal swab specimens and should not be used as a sole basis for treatment. Nasal washings and aspirates are unacceptable for Xpert Xpress SARS-CoV-2/FLU/RSV  testing.  Fact Sheet for Patients: EntrepreneurPulse.com.au  Fact Sheet for Healthcare Providers: IncredibleEmployment.be  This test is not yet approved or cleared by the [place] FDA and has been authorized for detection and/or diagnosis of SARS-CoV-2 by FDA under  an Emergency Use Authorization (EUA). This EUA will remain in effect (meaning this test can be used) for the duration of the COVID-19 declaration under Section 564(b)(1) of the Act, 21 U.S.C. section 360bbb-3(b)(1), unless the authorization is terminated or revoked.  Performed at [facility], [address] [place], [place] [address]   MRSA Next Gen by PCR, Nasal     Status: None   Collection Time: 06/08/21 12:00 AM  Result Value Ref Range Status   MRSA by PCR Next Gen NOT DETECTED NOT DETECTED Final    Comment: (NOTE) The GeneXpert MRSA Assay (FDA approved for NASAL specimens only), is one component of a comprehensive MRSA colonization surveillance program. It is not intended to diagnose MRSA infection nor to guide or monitor treatment for MRSA infections. Test performance is not FDA approved in patients less than 1 years old. Performed at [facility], [place] [address] [place], [place] [address]          Radiology Studies: DG Chest 2 View  Result Date: 06/07/2021 CLINICAL DATA:  Concern for pneumonia. EXAM: CHEST - 2 VIEW COMPARISON:  Chest x-ray 08/14/2020.  Chest CT 07/17/2019. FINDINGS: There are minimal fibrotic changes in the lung bases similar to the prior study. There is no new lung consolidation, pleural effusion or pneumothorax identified. There are atherosclerotic calcifications of the aorta. Heart is borderline enlarged unchanged. ICD is again noted. No acute fractures are seen. IMPRESSION: 1.  No acute cardiopulmonary process. 2.  Stable fibrotic changes in the lung bases. Electronically Signed   By: Ronney Asters M.D.   On: 06/07/2021 21:43   CT Abdomen Pelvis W Contrast  Result Date: 06/07/2021 CLINICAL DATA:  Pelvic pain, rectal pain EXAM: CT ABDOMEN AND PELVIS WITH CONTRAST TECHNIQUE: Multidetector CT imaging of the abdomen and pelvis was performed using the standard protocol following bolus administration of intravenous contrast.  CONTRAST:  48mL OMNIPAQUE IOHEXOL 300 MG/ML  SOLN COMPARISON:  11/04/2020 FINDINGS: Lower chest: New small patchy alveolar infiltrates are seen in both lower lobes. There is minimal right pleural effusion. Small pericardial effusion is seen. Pacemaker/defibrillator leads are noted in the heart. There are surgical clips close to the gastroesophageal junction. Hepatobiliary: There is fatty infiltration. There are few small low-density foci in the liver largest measuring 8 mm with no significant interval change. Gallbladder is unremarkable. Pancreas: No focal abnormality is seen Spleen: Unremarkable. 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 There is no hydronephrosis. There are no renal or ureteral stones. Urinary bladder is distended. There is 11 mm smooth marginated low-density structure in the lower pole of right kidney suggesting renal cyst. There is possible 3 mm cyst in the medial midportion of left kidney. Stomach/Bowel: Small hiatal hernia is seen. Small bowel loops are not dilated. Appendix is not seen. There is no pericecal inflammation. No significant wall thickening is seen in colon. Multiple diverticula are seen in colon without signs of focal acute diverticulitis. There is a large multiloculated fluid collection with thick wall in the perirectal and perianal region measuring approximately 6.2 x 5.6 by 5.2 cm. There are no pockets of air in the fluid collection. Presacral soft tissues are unremarkable. Vascular/Lymphatic: Atherosclerotic plaques and calcifications are seen  in the aorta and its major branches. There is 3.2 cm aneurysm in the infrarenal aorta. No significant lymphadenopathy seen. Reproductive: Unremarkable. Other: There is no ascites or pneumoperitoneum. Musculoskeletal: Degenerative changes are noted in the lumbar spine with spinal stenosis and encroachment of neural foramina at multiple levels. IMPRESSION: There is interval appearance of  large lobulated thick-walled fluid collection around the lower rectum and anus along the lateral and posterior walls measuring 6.2 cm in maximum diameter suggesting perirectal/perianal abscess. Surgical consultation should be considered. There are new patchy infiltrates in both lower lobes suggesting multifocal pneumonia or aspiration. There is no evidence of intestinal obstruction or pneumoperitoneum. There is no hydronephrosis. Diverticulosis of colon without signs of focal acute diverticulitis. There is 3.2 cm infrarenal aortic aneurysm. Small hiatal hernia. Small pericardial effusion. Possible hepatic and renal cysts. Electronically Signed   By: Elmer Picker M.D.   On: 06/07/2021 12:46        Scheduled Meds:  [MAR Hold] acetaminophen  1,000 mg Oral Q6H   [MAR Hold] aspirin  81 mg Oral Daily   [MAR Hold] bupivacaine liposome  20 mL Infiltration On Call   [MAR Hold] Chlorhexidine Gluconate Cloth  6 each Topical Once   [MAR Hold] docusate sodium  100 mg Oral BID   [START ON 06/09/2021] furosemide  40 mg Oral Daily   [MAR Hold] insulin aspart  0-9 Units Subcutaneous Q4H   [MAR Hold] linaclotide  72 mcg Oral QAC breakfast   [MAR Hold] lip balm  1 application Topical BID   [MAR Hold] Magnesium  200 mg Oral BID   [MAR Hold] metoprolol succinate  12.5 mg Oral Daily   [MAR Hold] pantoprazole  40 mg Oral Daily   [MAR Hold] polyethylene glycol  17 g Oral Daily   [START ON 06/09/2021] rosuvastatin  20 mg Oral QHS   [MAR Hold] sennosides-docusate sodium  1 tablet Oral QHS   [MAR Hold] zinc gluconate  50 mg Oral Daily   Continuous Infusions:  azithromycin     lactated ringers 50 mL/hr at 06/08/21 1132   [MAR Hold] methocarbamol (ROBAXIN) IV     [MAR Hold] ondansetron (ZOFRAN) IV     [MAR Hold] piperacillin-tazobactam (ZOSYN)  IV 3.375 g (06/08/21 0546)     LOS: 1 day    Time spent: 45 minutes spent on chart review, discussion with nursing staff, consultants, updating family and  interview/physical exam; more than 50% of that time was spent in counseling and/or coordination of care.    Breeona Waid J [place] DO [facility] Available via Epic secure chat 7am-7pm After these hours, please refer to coverage provider listed on [email or web address] 06/08/2021, 1:36 PM

## 2021-06-09 ENCOUNTER — Encounter (HOSPITAL_COMMUNITY): Payer: Self-pay | Admitting: General Surgery

## 2021-06-09 ENCOUNTER — Inpatient Hospital Stay (HOSPITAL_COMMUNITY): Payer: Medicare Other

## 2021-06-09 DIAGNOSIS — I342 Nonrheumatic mitral (valve) stenosis: Secondary | ICD-10-CM

## 2021-06-09 DIAGNOSIS — I351 Nonrheumatic aortic (valve) insufficiency: Secondary | ICD-10-CM | POA: Diagnosis not present

## 2021-06-09 DIAGNOSIS — I350 Nonrheumatic aortic (valve) stenosis: Secondary | ICD-10-CM | POA: Diagnosis not present

## 2021-06-09 LAB — CBC
HCT: 38.2 % — ABNORMAL LOW (ref 39.0–52.0)
Hemoglobin: 12.3 g/dL — ABNORMAL LOW (ref 13.0–17.0)
MCH: 28.4 pg (ref 26.0–34.0)
MCHC: 32.2 g/dL (ref 30.0–36.0)
MCV: 88.2 fL (ref 80.0–100.0)
Platelets: 235 10*3/uL (ref 150–400)
RBC: 4.33 MIL/uL (ref 4.22–5.81)
RDW: 13.6 % (ref 11.5–15.5)
WBC: 10.8 10*3/uL — ABNORMAL HIGH (ref 4.0–10.5)
nRBC: 0 % (ref 0.0–0.2)

## 2021-06-09 LAB — MAGNESIUM: Magnesium: 2.3 mg/dL (ref 1.7–2.4)

## 2021-06-09 LAB — GLUCOSE, CAPILLARY
Glucose-Capillary: 108 mg/dL — ABNORMAL HIGH (ref 70–99)
Glucose-Capillary: 115 mg/dL — ABNORMAL HIGH (ref 70–99)
Glucose-Capillary: 152 mg/dL — ABNORMAL HIGH (ref 70–99)
Glucose-Capillary: 82 mg/dL (ref 70–99)
Glucose-Capillary: 97 mg/dL (ref 70–99)

## 2021-06-09 LAB — BASIC METABOLIC PANEL
Anion gap: 9 (ref 5–15)
BUN: 26 mg/dL — ABNORMAL HIGH (ref 8–23)
CO2: 21 mmol/L — ABNORMAL LOW (ref 22–32)
Calcium: 8 mg/dL — ABNORMAL LOW (ref 8.9–10.3)
Chloride: 103 mmol/L (ref 98–111)
Creatinine, Ser: 1.44 mg/dL — ABNORMAL HIGH (ref 0.61–1.24)
GFR, Estimated: 46 mL/min — ABNORMAL LOW (ref >60)
Glucose, Bld: 94 mg/dL (ref 70–99)
Potassium: 4.1 mmol/L (ref 3.5–5.1)
Sodium: 133 mmol/L — ABNORMAL LOW (ref 135–145)

## 2021-06-09 LAB — ECHOCARDIOGRAM COMPLETE
AR max vel: 0.9 cm2
AV Area VTI: 0.97 cm2
AV Area mean vel: 0.93 cm2
AV Mean grad: 10 mmHg
AV Peak grad: 18.7 mmHg
Ao pk vel: 2.17 m/s
Area-P 1/2: 3.16 cm2
Calc EF: 29.8 %
Height: 72 in
MV VTI: 1.15 cm2
S' Lateral: 2.9 cm
Single Plane A2C EF: 31.2 %
Single Plane A4C EF: 31.7 %
Weight: 2797.2 oz

## 2021-06-09 LAB — LEGIONELLA PNEUMOPHILA SEROGP 1 UR AG: L. pneumophila Serogp 1 Ur Ag: NEGATIVE

## 2021-06-09 MED ORDER — AZITHROMYCIN 250 MG PO TABS
500.0000 mg | ORAL_TABLET | Freq: Every day | ORAL | Status: DC
  Administered 2021-06-09 – 2021-06-10 (×2): 500 mg via ORAL
  Filled 2021-06-09 (×2): qty 2

## 2021-06-09 MED ORDER — LOPERAMIDE HCL 2 MG PO CAPS
2.0000 mg | ORAL_CAPSULE | Freq: Once | ORAL | Status: AC
  Administered 2021-06-09: 2 mg via ORAL
  Filled 2021-06-09: qty 1

## 2021-06-09 MED ORDER — TAMSULOSIN HCL 0.4 MG PO CAPS
0.4000 mg | ORAL_CAPSULE | Freq: Every day | ORAL | Status: DC
  Administered 2021-06-09 – 2021-06-10 (×2): 0.4 mg via ORAL
  Filled 2021-06-09 (×2): qty 1

## 2021-06-09 NOTE — TOC Progression Note (Signed)
Transition of Care [facility]) - Progression Note    Patient Details  Name: Daniel Woodard MRN: [id number] Date of Birth: June 25, 1929  Transition of Care [facility]) CM/SW Contact  Purcell Mouton, RN Phone Number: 06/09/2021, 1:11 PM  Clinical Narrative:    TOC reviewed chart. Pt from home alone. TOC will continue to follow for discharge needs.   Expected Discharge Plan: [place] Barriers to Discharge: No Barriers Identified  Expected Discharge Plan and Services Expected Discharge Plan: [place] arrangements for the past 2 months: Single Family Home                                       Social Determinants of Health (SDOH) Interventions    Readmission Risk Interventions No flowsheet data found.

## 2021-06-09 NOTE — Discharge Instructions (Signed)
Home Instructions Following Incision and Drainage of Perirectal Abscess  Wound care - A dressing has been applied to control any bleeding or drainage immediately after your procedure.  You may remove this dressing at your first bowel movement or tomorrow morning, whichever comes first.  There may be packing inside your wound as well that should be removed with the dressing.  You do not need to repack the area.  After the dressing is removed, clean the area gently with a mild soap and warm water and place a piece of 100% cotton over the area.  Change to cotton every 1-3 hours while awake to keep the area clean and dry.   - Beginning tomorrow, sit in a tub of warm water for 15-20 minutes at least twice a day and after bowel movements (Sitz baths).  This will help with healing, pain and discomfort. - A small amount of bleeding is to be expected.  If you notice an increase in the bleeding, place a large piece of cotton (about the size of a golf ball) next to the anal opening and sit on a hard surface for 15 minutes.  If the bleeding persists or if you are concerned, please call the office.  Do not sit on rubber rings.  Instead, sit on a soft pillow.   - you have a drain in place to help the area heal. This will be removed at your follow up appointment  Diet -Eat a regular diet.  Avoid foods that may constipate you or give you diarrhea.  Drink 6-8 glasses of water a day and avoid seeds, nuts and popcorn until the area heals.  Medication -Take pain medication as directed.  Do not drive or operate machinery if you are taking a prescription pain medication.   - We recommend Extra Strength Tylenol for mild to moderate pain.  This can be taken as instructed on the bottle.   - If you are given a prescription for antibiotics, take as instructed by your doctor until the entire course is completed  Bowel Habits Avoid laxatives unless instructed by your doctor. Take a fiber supplement twice a day (Metamucil,  FiberCon, Benefiber) Avoid excessive straining to have a bowel movement Do not go for more than 3 days without a bowel movement.  Take a regular Fleet enema if you are constipated.  Call the office if unable to do this or no results.    Activity Resume activities as tolerated beginning tomorrow.  Avoid strenuous activities or sports for one week.    Call the office if you have any questions.  Call IMMEDIATELY if you should develop persistent heavy rectal bleeding, increase in pain, difficulty urinating or fever greater than 100 F.

## 2021-06-09 NOTE — Evaluation (Signed)
Physical Therapy Evaluation Patient Details Name: Daniel Woodard MRN: [id number] DOB: 1929/08/27 Today's Date: 06/09/2021  History of Present Illness  88 yo male admitted with perirectal abscess-s/p I&D 06/08/21. Hx of Afib, HB, PPM, CHF, DM, CKD, COPD, dementia  Clinical Impression  On eval, pt was Supv-Min guard for mobility. He walked ~400 feet with a RW. Pt tolerated activity well. No LOB with RW use. He denied pain during session. Discussed d/c plan-pt plans to return home where he lives alone. He has family that checks in on occasion. Will plan to follow during hospital stay. Will recommend HHPT (a visit or two) to ensure pt safely transitions back into home environment.      Recommendations for follow up therapy are one component of a multi-disciplinary discharge planning process, led by the attending physician.  Recommendations may be updated based on patient status, additional functional criteria and insurance authorization.  Follow Up Recommendations Home health PT (to ensure safe transition back into home environment since pt lives alone)    Assistance Recommended at Discharge Intermittent Supervision/Assistance  Patient can return home with the following       Equipment Recommendations None recommended by PT  Recommendations for Other Services       Functional Status Assessment Patient has had a recent decline in their functional status and demonstrates the ability to make significant improvements in function in a reasonable and predictable amount of time.     Precautions / Restrictions Precautions Precautions: Fall Precaution Comments: mesh briefs/pad Restrictions Weight Bearing Restrictions: No      Mobility  Bed Mobility Overal bed mobility: Modified Independent             General bed mobility comments: HOB elevated    Transfers Overall transfer level: Needs assistance Equipment used: Rolling walker (2 wheels) Transfers: Sit to/from Stand Sit to  Stand: Supervision           General transfer comment: Supv for safety    Ambulation/Gait Ambulation/Gait assistance: Min guard Gait Distance (Feet): 400 Feet Assistive device: Rolling walker (2 wheels) Gait Pattern/deviations: Step-through pattern       General Gait Details: Min guard for safety. Good pace. No overt LOB with RW use.  Stairs            Wheelchair Mobility    Modified Rankin (Stroke Patients Only)       Balance Overall balance assessment: Mild deficits observed, not formally tested                                           Pertinent Vitals/Pain Pain Assessment: No/denies pain    Home Living Family/patient expects to be discharged to:: Private residence Living Arrangements: Alone   Type of Home:  (town home) Home Access: Level entry       [place]: One [place]: Conservation officer, nature (2 wheels);Cane - single point;Wheelchair - manual      Prior Function Prior Level of Function : Independent/Modified Independent                     Hand Dominance        Extremity/Trunk Assessment   Upper Extremity Assessment Upper Extremity Assessment: Overall WFL for tasks assessed    Lower Extremity Assessment Lower Extremity Assessment: Generalized weakness    Cervical / Trunk Assessment Cervical / Trunk Assessment: Normal  Communication  Communication: No difficulties  Cognition Arousal/Alertness: Awake/alert Behavior During Therapy: WFL for tasks assessed/performed Overall Cognitive Status: Within Functional Limits for tasks assessed                                 General Comments: a bit impulsive        General Comments      Exercises     Assessment/Plan    PT Assessment Patient needs continued PT services  PT Problem List Decreased mobility;Decreased balance;Decreased knowledge of use of DME       PT Treatment Interventions DME instruction;Gait training;Therapeutic  activities;Therapeutic exercise;Patient/family education;Balance training;Functional mobility training    PT Goals (Current goals can be found in the Care Plan section)  Acute Rehab PT Goals Patient Stated Goal: home PT Goal Formulation: With patient/family Time For Goal Achievement: 06/23/21 Potential to Achieve Goals: Good    Frequency Min 3X/week     Co-evaluation               AM-PAC PT "6 Clicks" Mobility  Outcome Measure Help needed turning from your back to your side while in a flat bed without using bedrails?: None Help needed moving from lying on your back to sitting on the side of a flat bed without using bedrails?: None Help needed moving to and from a bed to a chair (including a wheelchair)?: A Little Help needed standing up from a chair using your arms (e.g., wheelchair or bedside chair)?: A Little Help needed to walk in hospital room?: A Little Help needed climbing 3-5 steps with a railing? : A Little 6 Click Score: 20    End of Session   Activity Tolerance: Patient tolerated treatment well Patient left: in bed;with call bell/phone within reach;with family/visitor present   PT Visit Diagnosis: Unsteadiness on feet (R26.81)    Time: 2130-8657 PT Time Calculation (min) (ACUTE ONLY): 15 min   Charges:   PT Evaluation $PT Eval Moderate Complexity: 1 Mod             Doreatha Massed, PT Acute Rehabilitation  Office: [phone] Pager: [phone]

## 2021-06-09 NOTE — Progress Notes (Signed)
PROGRESS NOTE  Daniel Woodard ZOX:[id number] DOB: Jun 26, 1929 DOA: 06/07/2021 PCP: Biagio Borg, MD  Brief History   Daniel Woodard is a 88 year old male with past medical history significant for paroxysmal atrial fibrillation, complete heart block s/p PPM, chronic systolic congestive heart failure, nonischemic cardiomyopathy, COPD, BPH, CAS s/p CEA, CKD stage IIIb, type 2 diabetes mellitus, essential hypertension, hyperlipidemia who initially presented to [facility] ED with complaints of constipation and rectal pain.  Woodard reports onset for several years; with recent worsening in which she has been having difficulty with bowel movements and sitting.  He has been taking over-Daniel-counter laxatives without much improvement.  Denies rectal bleeding or discharge.  No other complaints at this time.  Denies fever/chills, no cough/shortness of breath, no chest pain, no nausea/vomiting, no abdominal pain, no dysuria, no urinary frequency/urgency, no lower extremity edema.   In Daniel ED, temperature 97.7 F, HR 64, RR 20, BP 189/92, SPO2 95% on room air. Sodium 133, potassium 4.4, chloride 99, CO2 21, glucose 109, BUN 30, creatinine 1.60, AST 31, ALT 29, total bilirubin 1.2.  WBC 11.8, hemoglobin 13.4, platelets 264.  Procalcitonin 0.22.  BNP 995.8.  COVID-19 PCR negative.  Influenza A/B PCR negative.  Urinalysis with 40 ketones, otherwise unrevealing.  FOBT positive.  Chest x-ray with no acute cardiopulmonary disease process, stable fibrotic changes lung bases.  CT abdomen/pelvis with interval appearance large lobulated thick-walled fluid collection lower rectum/anus along Daniel lateral and posterior walls measuring 2.6 cm in maximum diameter consistent with perirectal/perianal abscess; also new patchy infiltrates bilateral lower lobes suggestive of multifocal pneumonia versus aspiration; no evidence of intestinal obstruction or pneumoperitoneum, no hydronephrosis.  General surgery was consulted.  Hospital service  consulted for further evaluation management.  Daniel Woodard underwent incision and drainage of Daniel perirectal abscess with general surgery on 06/08/2021. Daniel Woodard is recovery nicely. No packing is necessary. Daniel Woodard is having sitz baths. He has been cleared for discharge to home by general surgery.   General surgery placed foley catheter due to urinary retention. Will remove when fel appropriate by general surgery.  Daniel Woodard has been evaluated by PT. They have recommended discharge to home with home health PT.   Consultants  General surgery  Procedures  I & D of peri-rectal abscess on 06/08/2021.  Antibiotics   Anti-infectives (From admission, onward)    Start     Dose/Rate Route Frequency Ordered Stop   06/10/21 1200  vancomycin (VANCOREADY) IVPB 1250 mg/250 mL  Status:  Discontinued        1,250 mg 166.7 mL/hr over 90 Minutes Intravenous Every 36 hours 06/08/21 0151 06/08/21 1329   06/09/21 1800  azithromycin (ZITHROMAX) tablet 500 mg        500 mg Oral Daily 06/09/21 1331 06/12/21 0959   06/08/21 2000  azithromycin (ZITHROMAX) 500 mg in sodium chloride 0.9 % 250 mL IVPB  Status:  Discontinued        500 mg 250 mL/hr over 60 Minutes Intravenous Daily 06/08/21 1329 06/09/21 1331   06/08/21 1100  ceFAZolin (ANCEF) IVPB 2g/100 mL premix       See Hyperspace for full Linked Orders Report.   2 g 200 mL/hr over 30 Minutes Intravenous On call to O.R. 06/07/21 2026 06/08/21 1317   06/08/21 1100  metroNIDAZOLE (FLAGYL) IVPB 500 mg       See Hyperspace for full Linked Orders Report.   500 mg 100 mL/hr over 60 Minutes Intravenous On call to O.R. 06/07/21 2026 06/08/21  1320   06/07/21 2230  vancomycin (VANCOREADY) IVPB 1500 mg/300 mL        1,500 mg 150 mL/hr over 120 Minutes Intravenous  Once 06/07/21 2044 06/08/21 0248   06/07/21 2200  piperacillin-tazobactam (ZOSYN) IVPB 3.375 g        3.375 g 12.5 mL/hr over 240 Minutes Intravenous Every 8 hours 06/07/21 2034 06/12/21 2159    06/07/21 1530  azithromycin (ZITHROMAX) 500 mg in sodium chloride 0.9 % 250 mL IVPB        500 mg 250 mL/hr over 60 Minutes Intravenous  Once 06/07/21 1523 06/07/21 1710   06/07/21 1315  cefTRIAXone (ROCEPHIN) 1 g in sodium chloride 0.9 % 100 mL IVPB  Status:  Discontinued        1 g 200 mL/hr over 30 Minutes Intravenous Every 24 hours 06/07/21 1302 06/07/21 2034   06/07/21 1315  metroNIDAZOLE (FLAGYL) IVPB 500 mg        500 mg 100 mL/hr over 60 Minutes Intravenous  Once 06/07/21 1302 06/07/21 1505      Subjective  Daniel Woodard is resting quietly. No new complaints.   Objective   Vitals:  Vitals:   06/09/21 0530 06/09/21 1511  BP: 135/65 (!) 147/68  Pulse: 80 74  Resp: 18 18  Temp: 98.1 F (36.7 C) 98.2 F (36.8 C)  SpO2: 93% 96%    Exam:  Constitutional:  Daniel Woodard is awake, alert, and oriented x 3. No acute distress. Respiratory:  No increased work of breathing. No wheezes, rales, or rhonchi No tactile fremitus Cardiovascular:  Regular rate and rhythm No murmurs, ectopy, or gallups. No lateral PMI. No thrills. Abdomen:  Abdomen is soft, non-tender, non-distended No hernias, masses, or organomegaly Normoactive bowel sounds.  Musculoskeletal:  No cyanosis, clubbing, or edema Skin:  No rashes, lesions, ulcers palpation of skin: no induration or nodules Neurologic:  CN 2-12 intact Sensation all 4 extremities intact Psychiatric:  Mental status Mood, affect appropriate Orientation to person, place, time  judgment and insight appear intact   I have personally reviewed Daniel following:   Today's Data  Vitals  Lab Data  BMP CBC  Micro Data    Imaging  CT abdomen and pelvis  Cardiology Data    Other Data    Scheduled Meds:  acetaminophen  1,000 mg Oral Q6H   aspirin  81 mg Oral Daily   azithromycin  500 mg Oral Daily   Chlorhexidine Gluconate Cloth  6 each Topical Once   docusate sodium  100 mg Oral BID   furosemide  40 mg Oral Daily    insulin aspart  0-9 Units Subcutaneous Q4H   linaclotide  72 mcg Oral QAC breakfast   lip balm  1 application Topical BID   magnesium oxide  200 mg Oral BID   metoprolol succinate  12.5 mg Oral Daily   pantoprazole  40 mg Oral Daily   polyethylene glycol  17 g Oral Daily   rosuvastatin  20 mg Oral QHS   senna-docusate  1 tablet Oral QHS   tamsulosin  0.4 mg Oral Daily   zinc sulfate  220 mg Oral Daily   Continuous Infusions:  lactated ringers 50 mL/hr at 06/08/21 1305   methocarbamol (ROBAXIN) IV     ondansetron (ZOFRAN) IV     piperacillin-tazobactam (ZOSYN)  IV 3.375 g (06/09/21 1454)    Principal Problem:   Perirectal abscess Active Problems:   Diabetes ([place])   Hyperlipidemia   Depression   Essential hypertension  COPD GOLD II / AB component    Benign prostatic hyperplasia   Biventricular cardiac pacemaker in situ   CHF (congestive heart failure) (HCC)   CKD (chronic kidney disease), stage III (HCC)   Constipation, chronic   Dementia (HCC)   Decreased cardiac ejection fraction   LOS: 2 days   A & P  Perirectal/perianal abscess Woodard presenting to Daniel ED with progressive constipation and rectal pain.  CT abdomen/pelvis with interval appearance large lobulated thick-walled fluid collection lower rectum/anus along Daniel lateral and posterior walls measuring 2.6 cm in maximum diameter consistent with perirectal/perianal abscess.  Procalcitonin 0.22.  No blood cultures ordered on admission prior to antibiotic initiation. --General surgery following, appreciate assistance --Incision and drainage of abscess planned by Dr. Kieth Brightly today --MRSA PCR negative, will discontinue vancomycin --Continue antibiotics Zosyn --Repeat CBC in Daniel a.m.   Community-acquired versus aspiration pneumonia versus atelectasis Woodard is afebrile, slight leukocytosis but likely related to perirectal/perianal abscess.  Oxygenating well on room air.  Chest x-ray with no vascular congestion or  consolidation.  CT abdomen/pelvis with incidental finding of multifocal pneumonia versus aspiration bilateral lower lobes.  COVID-19 PCR and influenza A PCR negative.  Blood cultures were not performed on admission.  Strep pneumo urinary antigen negative. --Legionella urinary antigen: Pending --Sputum culture ordered but not collected as of yet --SLP evaluation postoperatively --Continue antibiotics as above for now with Zosyn --Continue azithromycin for atypical coverage --Aspiration precautions --Incentive spirometry/flutter valve --CBC in am   Hyponatremia: Resolved Sodium 133 on admission, likely secondary to dehydration in Daniel setting of chronic constipation. --Na 133>137 --Repeat BMP in Daniel a.m.   Type 2 diabetes mellitus Hemoglobin A1c 5.9, well controlled.  Diet controlled at home. --CBG as needed   Paroxysmal atrial fibrillation Complete heart block s/p PPM Atrial fibrillation listed as a chronic problem but not mentioned in his previous cardiology notes.  Woodard has pacemaker in place.  EKG with paced rhythm, rate 93.  Not on anticoagulation outpatient. --Continue metoprolol succinate 12.5 mg p.o. daily --Continue to monitor on telemetry   Chronic systolic congestive heart failure, compensated TTE 06/02/2020 with LVEF 30-35%, distal septum to apex akinetic with global hypokinesis in other segments, LV moderately decreased function, with regional wall motion abnormalities, mild concentric LVH, moderate MR, moderate aortic stenosis, IVC dilated.  Elevated BNP on admission, chest x-ray and CT with no findings of vascular congestion.  It appears euvolemic on physical exam. --Repeat TTE: Pending --Continue metoprolol succinate 12.5 mg p.o. daily --Resume home furosemide postoperatively --Strict I's and O's and daily weights   GERD: Continue PPI   Hyperlipidemia: --Resume Crestor postoperatively   Social: Woodard currently lives alone in a townhouse.  No significant stairs to  navigate.  Woodard has friends who check on him pretty frequently. --PT evaluation postoperatively to assess for discharge needs  I have seen and examined this Woodard myself. I have spent 34 minutes in his evaluation and care.    DVT prophylaxis: SCD's Start: 06/07/21 2025   Code Status: Prior Family Communication: No family present at bedside this morning   Disposition Plan: Home with [place], DO [facility] Direct contact: see [email or web address]  7PM-7AM contact night coverage as above 06/09/2021, 6:46 PM  LOS: 2 days

## 2021-06-09 NOTE — Progress Notes (Signed)
1 Day Post-Op   Chief Complaint/Subjective: No pain complaints, tolerating diet  Objective: Vital signs in last 24 hours: Temp:  [97.8 F (36.6 C)-98.7 F (37.1 C)] 98.1 F (36.7 C) (01/11 0530) Pulse Rate:  [78-92] 80 (01/11 0530) Resp:  [12-21] 18 (01/11 0530) BP: (115-146)/(64-89) 135/65 (01/11 0530) SpO2:  [92 %-100 %] 93 % (01/11 0530) Weight:  [79.3 kg] 79.3 kg (01/11 0500) Last BM Date: 06/05/21 Intake/Output from previous day: 01/10 0701 - 01/11 0700 In: 1280 [P.O.:480; I.V.:400; IV Piggyback:400] Out: 700 [Urine:700] Intake/Output this shift: No intake/output data recorded.  PE: Gen: NAD Resp: nonlabored Card: regular rate Rectal: drain in place, some purulent bloody drainage on pad  Lab Results:  Recent Labs    06/08/21 0407 06/09/21 0332  WBC 11.8* 10.8*  HGB 13.6 12.3*  HCT 41.8 38.2*  PLT 247 235   BMET Recent Labs    06/08/21 0407 06/09/21 0332  NA 137 133*  K 4.1 4.1  CL 102 103  CO2 25 21*  GLUCOSE 93 94  BUN 30* 26*  CREATININE 1.58* 1.44*  CALCIUM 8.4* 8.0*   PT/INR No results for input(s): LABPROT, INR in the last 72 hours. CMP     Component Value Date/Time   NA 133 (L) 06/09/2021 0332   NA 138 08/11/2020 0959   K 4.1 06/09/2021 0332   CL 103 06/09/2021 0332   CO2 21 (L) 06/09/2021 0332   GLUCOSE 94 06/09/2021 0332   BUN 26 (H) 06/09/2021 0332   BUN 43 (H) 08/11/2020 0959   CREATININE 1.44 (H) 06/09/2021 0332   CREATININE 1.39 (H) 07/05/2019 1538   CALCIUM 8.0 (L) 06/09/2021 0332   PROT 7.2 06/07/2021 1122   ALBUMIN 3.2 (L) 06/07/2021 1122   AST 31 06/07/2021 1122   ALT 29 06/07/2021 1122   ALKPHOS 81 06/07/2021 1122   BILITOT 1.2 06/07/2021 1122   GFRNONAA 46 (L) 06/09/2021 0332   GFRAA 46 (L) 03/07/2019 0322   Lipase     Component Value Date/Time   LIPASE 35 10/03/2020 1730    Studies/Results: DG Chest 2 View  Result Date: 06/07/2021 CLINICAL DATA:  Concern for pneumonia. EXAM: CHEST - 2 VIEW COMPARISON:   Chest x-ray 08/14/2020.  Chest CT 07/17/2019. FINDINGS: There are minimal fibrotic changes in the lung bases similar to the prior study. There is no new lung consolidation, pleural effusion or pneumothorax identified. There are atherosclerotic calcifications of the aorta. Heart is borderline enlarged unchanged. ICD is again noted. No acute fractures are seen. IMPRESSION: 1.  No acute cardiopulmonary process. 2.  Stable fibrotic changes in the lung bases. Electronically Signed   By: Ronney Asters M.D.   On: 06/07/2021 21:43   CT Abdomen Pelvis W Contrast  Result Date: 06/07/2021 CLINICAL DATA:  Pelvic pain, rectal pain EXAM: CT ABDOMEN AND PELVIS WITH CONTRAST TECHNIQUE: Multidetector CT imaging of the abdomen and pelvis was performed using the standard protocol following bolus administration of intravenous contrast. CONTRAST:  81mL OMNIPAQUE IOHEXOL 300 MG/ML  SOLN COMPARISON:  11/04/2020 FINDINGS: Lower chest: New small patchy alveolar infiltrates are seen in both lower lobes. There is minimal right pleural effusion. Small pericardial effusion is seen. Pacemaker/defibrillator leads are noted in the heart. There are surgical clips close to the gastroesophageal junction. Hepatobiliary: There is fatty infiltration. There are few small low-density foci in the liver largest measuring 8 mm with no significant interval change. Gallbladder is unremarkable. Pancreas: No focal abnormality is seen Spleen: Unremarkable. Adrenals/Urinary Tract: There is  hyperplasia of adrenals with no significant interval change. There is no hydronephrosis. There are no renal or ureteral stones. Urinary bladder is distended. There is 11 mm smooth marginated low-density structure in the lower pole of right kidney suggesting renal cyst. There is possible 3 mm cyst in the medial midportion of left kidney. Stomach/Bowel: Small hiatal hernia is seen. Small bowel loops are not dilated. Appendix is not seen. There is no pericecal inflammation. No  significant wall thickening is seen in colon. Multiple diverticula are seen in colon without signs of focal acute diverticulitis. There is a large multiloculated fluid collection with thick wall in the perirectal and perianal region measuring approximately 6.2 x 5.6 by 5.2 cm. There are no pockets of air in the fluid collection. Presacral soft tissues are unremarkable. Vascular/Lymphatic: Atherosclerotic plaques and calcifications are seen in the aorta and its major branches. There is 3.2 cm aneurysm in the infrarenal aorta. No significant lymphadenopathy seen. Reproductive: Unremarkable. Other: There is no ascites or pneumoperitoneum. Musculoskeletal: Degenerative changes are noted in the lumbar spine with spinal stenosis and encroachment of neural foramina at multiple levels. IMPRESSION: There is interval appearance of large lobulated thick-walled fluid collection around the lower rectum and anus along the lateral and posterior walls measuring 6.2 cm in maximum diameter suggesting perirectal/perianal abscess. Surgical consultation should be considered. There are new patchy infiltrates in both lower lobes suggesting multifocal pneumonia or aspiration. There is no evidence of intestinal obstruction or pneumoperitoneum. There is no hydronephrosis. Diverticulosis of colon without signs of focal acute diverticulitis. There is 3.2 cm infrarenal aortic aneurysm. Small hiatal hernia. Small pericardial effusion. Possible hepatic and renal cysts. Electronically Signed   By: Elmer Picker M.D.   On: 06/07/2021 12:46    Anti-infectives: Anti-infectives (From admission, onward)    Start     Dose/Rate Route Frequency Ordered Stop   06/10/21 1200  vancomycin (VANCOREADY) IVPB 1250 mg/250 mL  Status:  Discontinued        1,250 mg 166.7 mL/hr over 90 Minutes Intravenous Every 36 hours 06/08/21 0151 06/08/21 1329   06/08/21 2000  azithromycin (ZITHROMAX) 500 mg in sodium chloride 0.9 % 250 mL IVPB        500 mg 250  mL/hr over 60 Minutes Intravenous Daily 06/08/21 1329 06/12/21 1959   06/08/21 1100  ceFAZolin (ANCEF) IVPB 2g/100 mL premix       See Hyperspace for full Linked Orders Report.   2 g 200 mL/hr over 30 Minutes Intravenous On call to O.R. 06/07/21 2026 06/08/21 1317   06/08/21 1100  metroNIDAZOLE (FLAGYL) IVPB 500 mg       See Hyperspace for full Linked Orders Report.   500 mg 100 mL/hr over 60 Minutes Intravenous On call to O.R. 06/07/21 2026 06/08/21 1320   06/07/21 2230  vancomycin (VANCOREADY) IVPB 1500 mg/300 mL        1,500 mg 150 mL/hr over 120 Minutes Intravenous  Once 06/07/21 2044 06/08/21 0248   06/07/21 2200  piperacillin-tazobactam (ZOSYN) IVPB 3.375 g        3.375 g 12.5 mL/hr over 240 Minutes Intravenous Every 8 hours 06/07/21 2034 06/12/21 2159   06/07/21 1530  azithromycin (ZITHROMAX) 500 mg in sodium chloride 0.9 % 250 mL IVPB        500 mg 250 mL/hr over 60 Minutes Intravenous  Once 06/07/21 1523 06/07/21 1710   06/07/21 1315  cefTRIAXone (ROCEPHIN) 1 g in sodium chloride 0.9 % 100 mL IVPB  Status:  Discontinued  1 g 200 mL/hr over 30 Minutes Intravenous Every 24 hours 06/07/21 1302 06/07/21 2034   06/07/21 1315  metroNIDAZOLE (FLAGYL) IVPB 500 mg        500 mg 100 mL/hr over 60 Minutes Intravenous  Once 06/07/21 1302 06/07/21 1505       Assessment/Plan  s/p Procedure(s): INCISION AND DRAINAGE ABSCESS 06/08/2021    FEN - heart healthy VTE - SCDs ID - zosyn 1/9--> Disposition - arranging home health for wound assistance, no packing needed. Can start sitz baths today. Patient lives alone, but is ready for discharge from surgery standpoint   LOS: 2 days   I reviewed last 24 h vitals and pain scores, last 48 h intake and output, last 24 h labs and trends, and last 24 h imaging results.  This care required moderate level of medical decision making.   [facility] 06/09/2021, 11:26 AM Please see Amion for pager number  during day hours 7:00am-4:30pm or 7:00am -11:30am on weekends

## 2021-06-09 NOTE — Progress Notes (Signed)
PHARMACIST - PHYSICIAN COMMUNICATION DR:   Benny Lennert CONCERNING: Antibiotic IV to Oral Route Change Policy  RECOMMENDATION: This patient is receiving Azithromycin by the intravenous route.  Based on criteria approved by the Pharmacy and Therapeutics Committee, the antibiotic(s) is/are being converted to the equivalent oral dose form(s).   DESCRIPTION: These criteria include: Patient being treated for a respiratory tract infection, urinary tract infection, cellulitis or clostridium difficile associated diarrhea if on metronidazole The patient is not neutropenic and does not exhibit a GI malabsorption state The patient is eating (either orally or via tube) and/or has been taking other orally administered medications for a least 24 hours The patient is improving clinically and has a Tmax < 100.5  Minda Ditto PharmD 06/09/2021, 1:33 PM   If you have questions about this conversion, please contact the Pharmacy Department  []   [phone] )  [facility] []   [phone] )  [facility] []   [phone] )  [facility] []   [phone] )  [facility] [x]   [phone] )  [facility]

## 2021-06-09 NOTE — Progress Notes (Signed)
Patient out of bed and ambulated in hallway with walker and RN.  Tolerated well.  Dressing change complete.  Moderate drainage.  No c/o pain on this shift.

## 2021-06-09 NOTE — Evaluation (Signed)
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   BENIGN PROSTATIC HYPERTROPHY 04/09/2007   Blood transfusion without reported diagnosis    really not sure thinks 30-40 years ago   BRADYCARDIA 05/11/2009   Carotid artery occlusion    COLONIC POLYPS, HX OF 04/09/2007   ADENOMATOUS POLYPS 2004,2008   Complete heart block ([place])    COPD 04/09/2007   Cough 03/27/2008   DEPRESSION 04/09/2007   DIABETES MELLITUS, TYPE II 10/09/2007   DIVERTICULOSIS, COLON 04/09/2007   DIZZINESS 10/06/2008   GLUCOSE INTOLERANCE 04/09/2007   HYPERLIPIDEMIA 03/27/2007   HYPERTENSION 03/27/2007   Kidney stone    PERIPHERAL VASCULAR DISEASE 03/27/2007   PNEUMONIA ORGANISM NOS 11/08/2007   SHINGLES, HX OF 04/09/2007   SHOULDER PAIN, BILATERAL 08/01/2007   Ulcer    40 years ago   Past Surgical History:  Past Surgical History:  Procedure Laterality Date   APPENDECTOMY     88 years old   BIV UPGRADE N/A 08/14/2020   Procedure: BIV PPM UPGRADE;  Surgeon: Evans Lance, MD;  Location: [place] [facility];  Service: Cardiovascular;  Laterality: N/A;   CAROTID ENDARTERECTOMY  5/08   left   CARPAL TUNNEL RELEASE     bilateral   COLONOSCOPY     ESOPHAGOGASTRODUODENOSCOPY  06/07/2004   HEMORRHOID BANDING  1990s   INCISION AND DRAINAGE ABSCESS N/A 06/08/2021   Procedure: INCISION AND DRAINAGE ABSCESS;  Surgeon: Kieth Brightly Arta Bruce, MD;  Location: [facility];  Service: General;  Laterality: N/A;   LEFT HEART CATH AND CORONARY ANGIOGRAPHY N/A 06/02/2020   Procedure: LEFT HEART CATH AND CORONARY ANGIOGRAPHY;  Surgeon: Jettie Booze, MD;  Location: [place] [facility];  Service: Cardiovascular;  Laterality: N/A;   PACEMAKER INSERTION  07/11/2013   MDT ADDRL1 pacemaker implanted by Dr Lovena Le for complete heart block   PERMANENT PACEMAKER INSERTION N/A 07/11/2013   Procedure: PERMANENT PACEMAKER INSERTION;  Surgeon: Evans Lance, MD;  Location: [facility];  Service: Cardiovascular;  Laterality: N/A;   s/p PUD surgury     ? partial gastrectomy   HPI:  pt is an 88 yo male adm to [facility] with perirectal abscess.  He is s/p surgery 06/08/2021. Pt PMH + for COPD, HTN, HLD, BPH, CAD s/p CEA (right).  Swallow evaluation ordered. Pt deneis h/o issues with dysphagia . He does endorse chronic constipation - causing signifiant discomfort due to hard stool recently.   CXR negative - shows fibrosis.    Assessment / Plan / Recommendation  Clinical Impression  Intact oropharyngeal swallow based on clinical swallow evaluation. Pt easily passed 3 ounce Yale water screen and able to self feed. NO focal CN deficits and pt able to self feed.  Ill fitting dentures, but pt advised he can eat a regular diet despite this. Observed pt consuming saltines - with adequate mastication and oral clearance. Minimal adherence to dentures was effectively cleared by pt.  Recommend pt continue diet as tolerated. Reviewed with pt and his daughter Jeannene Patella general indications of dysphagia, aspiration.  No SLP follow up indicated. Thanks for this consult. SLP Visit Diagnosis: Dysphagia, unspecified (R13.10)  Aspiration Risk  No limitations    Diet Recommendation Regular;Thin liquid   Liquid Administration via: Cup;Straw Medication Administration: Whole meds with liquid Supervision: Patient able to self feed Compensations: Slow rate;Small sips/bites Postural Changes: Remain upright for at least 30 minutes after po intake;Seated upright at 90 degrees    Other  Recommendations Oral Care Recommendations: Oral care BID    Recommendations for follow up  therapy are one component of a multi-disciplinary discharge planning process, led by the attending physician.  Recommendations may be updated based on patient status, additional functional criteria and insurance authorization.  Follow up Recommendations No SLP follow up      Assistance Recommended at Discharge    Functional Status Assessment Patient has not had a recent decline in their functional status  Frequency and Duration     N/a       Prognosis N/a   Swallow Study   General Date of Onset: 06/09/21 HPI: pt is an 88 yo male adm to [facility] with perirectal abscess.  He is s/p surgery 06/08/2021. Pt PMH + for COPD, HTN, HLD, BPH, CAD s/p CEA (right).  Swallow evaluation ordered. Pt deneis h/o issues with dysphagia . He does endorse chronic constipation - causing signifiant discomfort due to hard stool recently.   CXR negative - shows fibrosis. Type of Study: Bedside Swallow Evaluation Diet Prior to this Study: Regular;Thin liquids Temperature Spikes Noted: No Respiratory Status: Room air History of Recent Intubation: Yes (for surgery) Behavior/Cognition: Alert;Cooperative;Pleasant mood Oral Cavity Assessment: Within Functional Limits Oral Care Completed by SLP: No Oral Cavity - Dentition: Dentures, top;Dentures, bottom (ill fitting, pt advises he can eat with them loose) Vision: Functional for self-feeding Self-Feeding Abilities: Able to feed self Patient Positioning: Upright in bed Baseline Vocal Quality: Normal Volitional Cough: Strong Volitional Swallow:  (xerostomic)    Oral/Motor/Sensory Function Overall Oral Motor/Sensory Function: Within functional limits   Ice Chips Ice chips: Not tested   Thin Liquid Thin Liquid: Within functional limits Presentation: Self Fed;Straw Other Comments: 3 ounce Yale swallow screen easily passed    Nectar Thick Nectar Thick Liquid: Not tested   Honey Thick Honey Thick Liquid: Not tested   Puree Puree: Not tested   Solid     Solid: Within  functional limits Presentation: Self Fredirick Woodard 06/09/2021,12:01 PM  Daniel Lime, MS [place] Office [phone] Cell [phone]

## 2021-06-09 NOTE — Anesthesia Postprocedure Evaluation (Signed)
Anesthesia Post Note  Patient: Daniel Woodard  Procedure(s) Performed: INCISION AND DRAINAGE ABSCESS     Patient location during evaluation: Other Anesthesia Type: General Level of consciousness: awake and alert Pain management: pain level controlled Vital Signs Assessment: post-procedure vital signs reviewed and stable Respiratory status: spontaneous breathing, nonlabored ventilation and respiratory function stable Cardiovascular status: blood pressure returned to baseline and stable Postop Assessment: no apparent nausea or vomiting Anesthetic complications: no   No notable events documented.  Last Vitals:  Vitals:   06/09/21 0013 06/09/21 0530  BP: 133/64 135/65  Pulse: 81 80  Resp: 18 18  Temp: 36.7 C 36.7 C  SpO2: 93% 93%    Last Pain:  Vitals:   06/09/21 0530  TempSrc: Oral  PainSc:                  Fumie Fiallo,W. EDMOND

## 2021-06-10 LAB — GLUCOSE, CAPILLARY
Glucose-Capillary: 118 mg/dL — ABNORMAL HIGH (ref 70–99)
Glucose-Capillary: 171 mg/dL — ABNORMAL HIGH (ref 70–99)
Glucose-Capillary: 80 mg/dL (ref 70–99)
Glucose-Capillary: 82 mg/dL (ref 70–99)
Glucose-Capillary: 84 mg/dL (ref 70–99)

## 2021-06-10 MED ORDER — POLYETHYLENE GLYCOL 3350 17 G PO PACK: 17.0000 g | PACK | Freq: Every day | ORAL | 0 refills | Status: DC

## 2021-06-10 MED ORDER — LOPERAMIDE HCL 2 MG PO CAPS
2.0000 mg | ORAL_CAPSULE | Freq: Once | ORAL | Status: AC
  Administered 2021-06-10: 2 mg via ORAL
  Filled 2021-06-10: qty 1

## 2021-06-10 MED ORDER — TRAMADOL HCL 50 MG PO TABS: 50.0000 mg | ORAL_TABLET | Freq: Four times a day (QID) | ORAL | 0 refills | Status: DC | PRN

## 2021-06-10 MED ORDER — MAGNESIUM 200 MG PO TABS: 200.0000 mg | ORAL_TABLET | Freq: Two times a day (BID) | ORAL | 0 refills | Status: DC

## 2021-06-10 MED ORDER — SIMETHICONE 40 MG/0.6ML PO SUSP: 80.0000 mg | Freq: Four times a day (QID) | ORAL | 0 refills | Status: DC | PRN

## 2021-06-10 MED ORDER — ROSUVASTATIN CALCIUM 20 MG PO TABS: 20.0000 mg | ORAL_TABLET | Freq: Every day | ORAL | 0 refills | Status: DC

## 2021-06-10 MED ORDER — AZITHROMYCIN 250 MG PO TABS: 500.0000 mg | ORAL_TABLET | Freq: Every day | ORAL | 0 refills | Status: DC

## 2021-06-10 MED ORDER — DOCUSATE SODIUM 100 MG PO CAPS: 100.0000 mg | ORAL_CAPSULE | Freq: Two times a day (BID) | ORAL | 0 refills | Status: DC

## 2021-06-10 MED ORDER — TAMSULOSIN HCL 0.4 MG PO CAPS: 0.4000 mg | ORAL_CAPSULE | Freq: Every day | ORAL | 0 refills | Status: DC

## 2021-06-10 MED ORDER — ACETAMINOPHEN 500 MG PO TABS: 1000.0000 mg | ORAL_TABLET | Freq: Four times a day (QID) | ORAL | Status: DC | PRN

## 2021-06-10 NOTE — Plan of Care (Signed)

## 2021-06-10 NOTE — Care Management Important Message (Signed)
Important Message  Patient Details IM Letter placed in Patients room. Name: Daniel Woodard MRN: [id number] Date of Birth: [birth year]   Medicare Important Message Given:  Yes     Kerin Salen 06/10/2021, 12:22 PM

## 2021-06-10 NOTE — Discharge Summary (Signed)
Physician Discharge Summary  Daniel Woodard YBO:[id number] DOB: 08-10-1929 DOA: 06/07/2021  PCP: Biagio Borg, MD  Admit date: 06/07/2021 Discharge date: 06/10/2021  Recommendations for Outpatient Follow-up:  Discharge to home with foley in place. Follow up with PCP in 7-10 days. Follow up with Dr. Milford Cage in 1 week.  Follow up with general surgery as directed.   Follow-up Information     Sur[facility], [facility]. Go on 06/17/2021.   Specialty: General Surgery Why: 3:45 PM. Please arrive 30 min prior to appointment time. Bring photo ID and insurance information with you. Contact information: [address] [place] [address] [phone]         Remi Haggard, MD. Schedule an appointment as soon as possible for a visit in 1 week(s).   Specialty: Urology Contact information: [address] [place] [address] [phone]                  Discharge Diagnoses: Principal diagnosis is #1 Perirectal/perianal abscess Community acquired pneumonia vs aspiration pneumonia Urinary retention Hyponatremia DM II Paroxysmal atrial fibrillation Chronic systolic congestive heart failure, compensated GERD Hyperlipidemia  Discharge Condition: Fair  Disposition: Home  Diet recommendation: Modified carbohydrates  Filed Weights   06/07/21 0949 06/09/21 0500  Weight: 76.7 kg 79.3 kg    History of present illness:  Patient is an 88 year old male with PMHx significant for atrial fibrillation, complete heart block, carotid artery occlusion, COPD, HTN, HLD, T2DM, and peripheral vascular disease who presented to [facility] with intermittent rectal pain x 4 months and constipation x4 days. Patient is somewhat confused so history is somewhat limited. Not on anticoagulation. Prior abdominal surgery includes appendectomy and gastric surgery for ulcer disease. He has also had hemorrhoid banding in the past. Allergic to ACE inhibitors.   Hospital Course:  Daniel Woodard is a 88 year old male with past medical history significant for paroxysmal atrial fibrillation, complete heart block s/p PPM, chronic systolic congestive heart failure, nonischemic cardiomyopathy, COPD, BPH, CAS s/p CEA, CKD stage IIIb, type 2 diabetes mellitus, essential hypertension, hyperlipidemia who initially presented to [place] ED with complaints of constipation and rectal pain.  Patient reports onset for several years; with recent worsening in which she has been having difficulty with bowel movements and sitting.  He has been taking over-the-counter laxatives without much improvement.  Denies rectal bleeding or discharge.  No other complaints at this time.  Denies fever/chills, no cough/shortness of breath, no chest pain, no nausea/vomiting, no abdominal pain, no dysuria, no urinary frequency/urgency, no lower extremity edema.   In the ED, temperature 97.7 F, HR 64, RR 20, BP 189/92, SPO2 95% on room air. Sodium 133, potassium 4.4, chloride 99, CO2 21, glucose 109, BUN 30, creatinine 1.60, AST 31, ALT 29, total bilirubin 1.2.  WBC 11.8, hemoglobin 13.4, platelets 264.  Procalcitonin 0.22.  BNP 995.8.  COVID-19 PCR negative.  Influenza A/B PCR negative.  Urinalysis with 40 ketones, otherwise unrevealing.  FOBT positive.  Chest x-ray with no acute cardiopulmonary disease process, stable fibrotic changes lung bases.  CT abdomen/pelvis with interval appearance large lobulated thick-walled fluid collection lower rectum/anus along the lateral and posterior walls measuring 2.6 cm in maximum diameter consistent with perirectal/perianal abscess; also new patchy infiltrates bilateral lower lobes suggestive of multifocal pneumonia versus aspiration; no evidence of intestinal obstruction or pneumoperitoneum, no hydronephrosis.  General surgery was consulted.  Hospital service consulted for further evaluation management.   The patient underwent incision  and drainage of the perirectal abscess with general  surgery on 06/08/2021. The patient is recovery nicely. No packing is necessary. The patient is having sitz baths. He has been cleared for discharge to home by general surgery.    General surgery placed foley catheter due to urinary retention. It was removed early on the morning of 06/10/2020. Flomax started. The patient failed a voiding trial. Foley catheter has been replaced. I have discussed the patient with Dr. Milford Cage of Urology. The patient will discharge to home with a foley catheter. He will follow up with Urology in a week as outpatient.  The patient has been evaluated by PT. They have recommended discharge to home with home health PT.   Today's assessment: S: The patient is resting comfortably. No new complaints.  O: Vitals:  Vitals:   06/10/21 0418 06/10/21 1331  BP: (!) 141/64 130/89  Pulse: 70 79  Resp: 18 18  Temp: 98 F (36.7 C) 97.9 F (36.6 C)  SpO2: 96% 100%    Exam:  Constitutional:  The patient is awake, alert, and oriented x 3. No acute distress. Respiratory:  No increased work of breathing. No wheezes, rales, or rhonchi No tactile fremitus Cardiovascular:  Regular rate and rhythm No murmurs, ectopy, or gallups. No lateral PMI. No thrills. Abdomen:  Abdomen is soft, non-tender, non-distended No hernias, masses, or organomegaly Normoactive bowel sounds.  Musculoskeletal:  No cyanosis, clubbing, or edema Skin:  No rashes, lesions, ulcers palpation of skin: no induration or nodules Neurologic:  CN 2-12 intact Sensation all 4 extremities intact Psychiatric:  Mental status Mood, affect appropriate Orientation to person, place, time  judgment and insight appear intact    Discharge Instructions  Discharge Instructions     Activity as tolerated - No restrictions   Complete by: As directed    Call MD for:  persistant nausea and vomiting   Complete by: As directed    Call MD for:  redness, tenderness, or signs of infection (pain, swelling, redness,  odor or green/yellow discharge around incision site)   Complete by: As directed    Call MD for:  severe uncontrolled pain   Complete by: As directed    Call MD for:  temperature >100.4   Complete by: As directed    Diet - low sodium heart healthy   Complete by: As directed    Discharge instructions   Complete by: As directed    Discharge to home with foley in place. Follow up with PCP in 7-10 days. Follow up with Dr. Milford Cage in 1 week.  Follow up with general surgery as directed.   Increase activity slowly   Complete by: As directed    No wound care   Complete by: As directed       Allergies as of 06/10/2021       Reactions   Ace Inhibitors Cough           Medication List     STOP taking these medications    CoQ-10 200 MG Caps   sennosides-docusate sodium 8.6-50 MG tablet Commonly known as: SENOKOT-S   Vitamin K2 100 MCG Tabs       TAKE these medications    acetaminophen 500 MG tablet Commonly known as: TYLENOL Take 2 tablets (1,000 mg total) by mouth every 6 (six) hours as needed for mild pain or moderate pain.   ASPIRIN 81 PO Take 81 mg by mouth daily.   azithromycin 250 MG tablet Commonly known as: ZITHROMAX Take 2  tablets (500 mg total) by mouth daily.   docusate sodium 100 MG capsule Commonly known as: COLACE Take 1 capsule (100 mg total) by mouth 2 (two) times daily.   furosemide 40 MG tablet Commonly known as: LASIX Take 1 tablet by mouth once daily   linaclotide 72 MCG capsule Commonly known as: Linzess Take 1 capsule (72 mcg total) by mouth daily before breakfast.   Magnesium 200 MG Tabs Take 1 tablet (200 mg total) by mouth 2 (two) times daily. What changed: how much to take   METAMUCIL PO Take 2 capsules by mouth daily as needed (constipation).   metoprolol succinate 25 MG 24 hr tablet Commonly known as: TOPROL-XL Take 1/2 (one-half) tablet by mouth once daily What changed: See the new instructions.   omeprazole 20 MG  capsule Commonly known as: PRILOSEC Take 1 capsule by mouth once daily What changed: how to take this   polyethylene glycol 17 g packet Commonly known as: MIRALAX / GLYCOLAX Take 17 g by mouth daily. Start taking on: June 11, 2021   potassium chloride SA 20 MEQ tablet Commonly known as: KLOR-CON M Take 1 tablet (20 mEq total) by mouth daily.   QUERCETIN PO Take 3 tablets by mouth 2 (two) times daily. With Vitamin C   rosuvastatin 20 MG tablet Commonly known as: CRESTOR Take 1 tablet (20 mg total) by mouth at bedtime.   simethicone 40 MG/0.6ML drops Commonly known as: MYLICON Take 1.2 mLs (80 mg total) by mouth 4 (four) times daily as needed for flatulence (PRN bloating, gassiness).   tamsulosin 0.4 MG Caps capsule Commonly known as: FLOMAX Take 1 capsule (0.4 mg total) by mouth daily. Start taking on: June 11, 2021   traMADol 50 MG tablet Commonly known as: ULTRAM Take 1 tablet (50 mg total) by mouth every 6 (six) hours as needed for moderate pain.   VITAMIN D PO Take 5,000 Units by mouth daily.   zinc gluconate 50 MG tablet Take 50 mg by mouth 2 (two) times daily.       Allergies  Allergen Reactions   Ace Inhibitors Cough         The results of significant diagnostics from this hospitalization (including imaging, microbiology, ancillary and laboratory) are listed below for reference.    Significant Diagnostic Studies: DG Chest 2 View  Result Date: 06/07/2021 CLINICAL DATA:  Concern for pneumonia. EXAM: CHEST - 2 VIEW COMPARISON:  Chest x-ray 08/14/2020.  Chest CT 07/17/2019. FINDINGS: There are minimal fibrotic changes in the lung bases similar to the prior study. There is no new lung consolidation, pleural effusion or pneumothorax identified. There are atherosclerotic calcifications of the aorta. Heart is borderline enlarged unchanged. ICD is again noted. No acute fractures are seen. IMPRESSION: 1.  No acute cardiopulmonary process. 2.  Stable fibrotic  changes in the lung bases. Electronically Signed   By: Ronney Asters M.D.   On: 06/07/2021 21:43   CT Abdomen Pelvis W Contrast  Result Date: 06/07/2021 CLINICAL DATA:  Pelvic pain, rectal pain EXAM: CT ABDOMEN AND PELVIS WITH CONTRAST TECHNIQUE: Multidetector CT imaging of the abdomen and pelvis was performed using the standard protocol following bolus administration of intravenous contrast. CONTRAST:  2mL OMNIPAQUE IOHEXOL 300 MG/ML  SOLN COMPARISON:  11/04/2020 FINDINGS: Lower chest: New small patchy alveolar infiltrates are seen in both lower lobes. There is minimal right pleural effusion. Small pericardial effusion is seen. Pacemaker/defibrillator leads are noted in the heart. There are surgical clips close to the gastroesophageal  junction. Hepatobiliary: There is fatty infiltration. There are few small low-density foci in the liver largest measuring 8 mm with no significant interval change. Gallbladder is unremarkable. Pancreas: No focal abnormality is seen Spleen: Unremarkable. 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 There is no hydronephrosis. There are no renal or ureteral stones. Urinary bladder is distended. There is 11 mm smooth marginated low-density structure in the lower pole of right kidney suggesting renal cyst. There is possible 3 mm cyst in the medial midportion of left kidney. Stomach/Bowel: Small hiatal hernia is seen. Small bowel loops are not dilated. Appendix is not seen. There is no pericecal inflammation. No significant wall thickening is seen in colon. Multiple diverticula are seen in colon without signs of focal acute diverticulitis. There is a large multiloculated fluid collection with thick wall in the perirectal and perianal region measuring approximately 6.2 x 5.6 by 5.2 cm. There are no pockets of air in the fluid collection. Presacral soft tissues are unremarkable. Vascular/Lymphatic: Atherosclerotic plaques and  calcifications are seen in the aorta and its major branches. There is 3.2 cm aneurysm in the infrarenal aorta. No significant lymphadenopathy seen. Reproductive: Unremarkable. Other: There is no ascites or pneumoperitoneum. Musculoskeletal: Degenerative changes are noted in the lumbar spine with spinal stenosis and encroachment of neural foramina at multiple levels. IMPRESSION: There is interval appearance of large lobulated thick-walled fluid collection around the lower rectum and anus along the lateral and posterior walls measuring 6.2 cm in maximum diameter suggesting perirectal/perianal abscess. Surgical consultation should be considered. There are new patchy infiltrates in both lower lobes suggesting multifocal pneumonia or aspiration. There is no evidence of intestinal obstruction or pneumoperitoneum. There is no hydronephrosis. Diverticulosis of colon without signs of focal acute diverticulitis. There is 3.2 cm infrarenal aortic aneurysm. Small hiatal hernia. Small pericardial effusion. Possible hepatic and renal cysts. Electronically Signed   By: Elmer Picker M.D.   On: 06/07/2021 12:46   ECHOCARDIOGRAM COMPLETE  Result Date: 06/09/2021    ECHOCARDIOGRAM REPORT   Patient Name:   Daniel Woodard Date of Exam: 06/09/2021 Medical Rec #:  [id number]       Height:       72.0 in Accession #:    [id number]      Weight:       174.8 lb Date of Birth:  08/14/1929       BSA:          2.012 m Patient Age:    24 years        BP:           135/65 mmHg Patient Gender: M               HR:           67 bpm. Exam Location:  Inpatient Procedure: 2D Echo, Cardiac Doppler and Color Doppler Indications:    Pericardial effusion, CHF  History:        Patient has prior history of Echocardiogram examinations, most                 recent 06/02/2020. COPD, Arrythmias:Bradycardia; Risk                 Factors:Hypertension and Diabetes.  Sonographer:    Glo Herring Referring Phys: [id number] [place]  1. Left  ventricular ejection fraction, by estimation, is 50 to 55%. The left ventricle has low normal function. The left ventricle demonstrates global hypokinesis. There is mild concentric  left ventricular hypertrophy. Left ventricular diastolic function could not be evaluated. Elevated left ventricular end-diastolic pressure.  2. Right ventricular systolic function is normal. The right ventricular size is normal.  3. Left atrial size was mildly dilated.  4. The mitral valve is degenerative. Trivial mitral valve regurgitation. Mild to moderate mitral stenosis. The mean mitral valve gradient is 3.0 mmHg. Severe mitral annular calcification.  5. The aortic valve is calcified. Aortic valve regurgitation is mild. Mild to moderate aortic valve stenosis. Aortic valve area, by VTI measures 0.97 cm. Aortic valve mean gradient measures 10.0 mmHg. Aortic valve Vmax measures 2.16 m/s.  6. The inferior vena cava is normal in size with greater than 50% respiratory variability, suggesting right atrial pressure of 3 mmHg.  7. Recommend definity contrast study. The endocardial borders are poorly defined in some views and cannot accurately assess EF. Gross estimate of EF is around 50% which is improved from prior study, but again, images are not optimal. Ther AV is not well  visualized. The DI and low SVI support some degree of low flow low gradient AS. Could consider TEE. FINDINGS  Left Ventricle: Left ventricular ejection fraction, by estimation, is 50 to 55%. The left ventricle has low normal function. The left ventricle demonstrates global hypokinesis. The left ventricular internal cavity size was normal in size. There is mild concentric left ventricular hypertrophy. Left ventricular diastolic function could not be evaluated due to abnormal septal motion. Left ventricular diastolic function could not be evaluated. Elevated left ventricular end-diastolic pressure. Right Ventricle: The right ventricular size is normal. No increase in  right ventricular wall thickness. Right ventricular systolic function is normal. Left Atrium: Left atrial size was mildly dilated. Right Atrium: Right atrial size was normal in size. Pericardium: There is no evidence of pericardial effusion. Mitral Valve: The mitral valve is degenerative in appearance. Severe mitral annular calcification. Trivial mitral valve regurgitation. Mild to moderate mitral valve stenosis. MV peak gradient, 5.9 mmHg. The mean mitral valve gradient is 3.0 mmHg. Tricuspid Valve: The tricuspid valve is normal in structure. Tricuspid valve regurgitation is not demonstrated. No evidence of tricuspid stenosis. Aortic Valve: The aortic valve is calcified. Aortic valve regurgitation is mild. Mild to moderate aortic stenosis is present. Aortic valve mean gradient measures 10.0 mmHg. Aortic valve peak gradient measures 18.7 mmHg. Aortic valve area, by VTI measures  0.97 cm. Pulmonic Valve: The pulmonic valve was normal in structure. Pulmonic valve regurgitation is trivial. No evidence of pulmonic stenosis. Aorta: The aortic root is normal in size and structure. Venous: The inferior vena cava is normal in size with greater than 50% respiratory variability, suggesting right atrial pressure of 3 mmHg. IAS/Shunts: No atrial level shunt detected by color flow Doppler. Additional Comments: A device lead is visualized.  LEFT VENTRICLE PLAX 2D LVIDd:         4.00 cm     Diastology LVIDs:         2.90 cm     LV e' medial:    4.90 cm/s LV PW:         1.20 cm     LV E/e' medial:  22.4 LV IVS:        1.20 cm     LV e' lateral:   4.19 cm/s LVOT diam:     1.80 cm     LV E/e' lateral: 26.3 LV SV:         45 LV SV Index:   23 LVOT Area:  2.54 cm  LV Volumes (MOD) LV vol d, MOD A2C: 83.4 ml LV vol d, MOD A4C: 87.1 ml LV vol s, MOD A2C: 57.4 ml LV vol s, MOD A4C: 59.5 ml LV SV MOD A2C:     26.0 ml LV SV MOD A4C:     87.1 ml LV SV MOD BP:      25.3 ml LEFT ATRIUM         Index LA diam:    4.10 cm 2.04 cm/m  AORTIC  VALVE                     PULMONIC VALVE AV Area (Vmax):    0.90 cm      PV Vmax:       0.78 m/s AV Area (Vmean):   0.93 cm      PV Peak grad:  2.4 mmHg AV Area (VTI):     0.97 cm AV Vmax:           216.50 cm/s AV Vmean:          142.000 cm/s AV VTI:            0.468 m AV Peak Grad:      18.7 mmHg AV Mean Grad:      10.0 mmHg LVOT Vmax:         76.40 cm/s LVOT Vmean:        51.650 cm/s LVOT VTI:          0.178 m LVOT/AV VTI ratio: 0.38  AORTA Ao Root diam: 3.30 cm Ao Asc diam:  2.60 cm MITRAL VALVE MV Area (PHT): 3.16 cm     SHUNTS MV Area VTI:   1.15 cm     Systemic VTI:  0.18 m MV Peak grad:  5.9 mmHg     Systemic Diam: 1.80 cm MV Mean grad:  3.0 mmHg MV Vmax:       1.21 m/s MV Vmean:      81.4 cm/s MV Decel Time: 240 msec MV E velocity: 110.00 cm/s MV A velocity: 109.00 cm/s MV E/A ratio:  1.01 Fransico Him MD Electronically signed by Fransico Him MD Signature Date/Time: 06/09/2021/1:25:50 PM    Final    CUP PACEART REMOTE DEVICE CHECK  Result Date: 05/14/2021 Scheduled remote reviewed. Normal device function.  2 NSVT, 5 & 14 beats, rates 158 & 185 Optivol crossed threshold 12/6 and is ongoing Next remote 91 days. LR   Microbiology: Recent Results (from the past 240 hour(s))  Resp Panel by RT-PCR (Flu A&B, Covid) Nasopharyngeal Swab     Status: None   Collection Time: 06/07/21  1:18 PM   Specimen: Nasopharyngeal Swab; Nasopharyngeal(NP) swabs in vial transport medium  Result Value Ref Range Status   SARS Coronavirus 2 by RT PCR NEGATIVE NEGATIVE Final    Comment: (NOTE) SARS-CoV-2 target nucleic acids are NOT DETECTED.  The SARS-CoV-2 RNA is generally detectable in upper respiratory specimens during the acute phase of infection. The lowest concentration of SARS-CoV-2 viral copies this assay can detect is 138 copies/mL. A negative result does not preclude SARS-Cov-2 infection and should not be used as the sole basis for treatment or other patient management decisions. A negative result  may occur with  improper specimen collection/handling, submission of specimen other than nasopharyngeal swab, presence of viral mutation(s) within the areas targeted by this assay, and inadequate number of viral copies(<138 copies/mL). A negative result must be combined with clinical observations, patient history, and epidemiological information.  The expected result is Negative.  Fact Sheet for Patients:  EntrepreneurPulse.com.au  Fact Sheet for Healthcare Providers:  IncredibleEmployment.be  This test is no t yet approved or cleared by the [place] FDA and  has been authorized for detection and/or diagnosis of SARS-CoV-2 by FDA under an Emergency Use Authorization (EUA). This EUA will remain  in effect (meaning this test can be used) for the duration of the COVID-19 declaration under Section 564(b)(1) of the Act, 21 U.S.C.section 360bbb-3(b)(1), unless the authorization is terminated  or revoked sooner.       Influenza A by PCR NEGATIVE NEGATIVE Final   Influenza B by PCR NEGATIVE NEGATIVE Final    Comment: (NOTE) The Xpert Xpress SARS-CoV-2/FLU/RSV plus assay is intended as an aid in the diagnosis of influenza from Nasopharyngeal swab specimens and should not be used as a sole basis for treatment. Nasal washings and aspirates are unacceptable for Xpert Xpress SARS-CoV-2/FLU/RSV testing.  Fact Sheet for Patients: EntrepreneurPulse.com.au  Fact Sheet for Healthcare Providers: IncredibleEmployment.be  This test is not yet approved or cleared by the [place] FDA and has been authorized for detection and/or diagnosis of SARS-CoV-2 by FDA under an Emergency Use Authorization (EUA). This EUA will remain in effect (meaning this test can be used) for the duration of the COVID-19 declaration under Section 564(b)(1) of the Act, 21 U.S.C. section 360bbb-3(b)(1), unless the authorization is terminated  or revoked.  Performed at [facility], [address] [place], [place] [address]   MRSA Next Gen by PCR, Nasal     Status: None   Collection Time: 06/08/21 12:00 AM  Result Value Ref Range Status   MRSA by PCR Next Gen NOT DETECTED NOT DETECTED Final    Comment: (NOTE) The GeneXpert MRSA Assay (FDA approved for NASAL specimens only), is one component of a comprehensive MRSA colonization surveillance program. It is not intended to diagnose MRSA infection nor to guide or monitor treatment for MRSA infections. Test performance is not FDA approved in patients less than 8 years old. Performed at [facility], [place] [address], [place], [place] [address]   Aerobic/Anaerobic Culture w Gram Stain (surgical/deep wound)     Status: None (Preliminary result)   Collection Time: 06/08/21  1:48 PM   Specimen: PATH Other; Tissue  Result Value Ref Range Status   Specimen Description   Final    ABSCESS PERIRECTAL Performed at [facility] [address] [place], [place] [address]    Special Requests   Final    NONE Performed at [facility], [place] [address], [place], [place] [address]    Gram Stain   Final    FEW WBC PRESENT, PREDOMINANTLY MONONUCLEAR MODERATE GRAM POSITIVE COCCI FEW GRAM NEGATIVE RODS RARE GRAM POSITIVE RODS    Culture   Final    FEW ESCHERICHIA COLI HOLDING FOR POSSIBLE ANAEROBE Performed at [facility], [place] [address], [place], [place] [address]    Report Status PENDING  Incomplete   Organism ID, Bacteria ESCHERICHIA COLI  Final      Susceptibility   Escherichia coli - MIC*    AMPICILLIN 8 SENSITIVE Sensitive     CEFAZOLIN <=4 SENSITIVE Sensitive     CEFEPIME <=0.12 SENSITIVE Sensitive     CEFTAZIDIME <=1 SENSITIVE Sensitive     CEFTRIAXONE <=0.25 SENSITIVE Sensitive     CIPROFLOXACIN <=0.25 SENSITIVE Sensitive     GENTAMICIN <=1 SENSITIVE Sensitive     IMIPENEM <=0.25  SENSITIVE  Sensitive     TRIMETH/SULFA <=20 SENSITIVE Sensitive     AMPICILLIN/SULBACTAM 4 SENSITIVE Sensitive     PIP/TAZO <=4 SENSITIVE Sensitive     * FEW ESCHERICHIA COLI     Labs: Basic Metabolic Panel: Recent Labs  Lab 06/07/21 1122 06/08/21 0407 06/09/21 0332  NA 133* 137 133*  K 4.4 4.1 4.1  CL 99 102 103  CO2 21* 25 21*  GLUCOSE 109* 93 94  BUN 30* 30* 26*  CREATININE 1.60* 1.58* 1.44*  CALCIUM 8.6* 8.4* 8.0*  MG  --   --  2.3   Liver Function Tests: Recent Labs  Lab 06/07/21 1122  AST 31  ALT 29  ALKPHOS 81  BILITOT 1.2  PROT 7.2  ALBUMIN 3.2*   No results for input(s): LIPASE, AMYLASE in the last 168 hours. No results for input(s): AMMONIA in the last 168 hours. CBC: Recent Labs  Lab 06/07/21 1122 06/08/21 0407 06/09/21 0332  WBC 11.8* 11.8* 10.8*  NEUTROABS 10.1*  --   --   HGB 13.4 13.6 12.3*  HCT 40.4 41.8 38.2*  MCV 86.1 88.2 88.2  PLT 264 247 235   Cardiac Enzymes: No results for input(s): CKTOTAL, CKMB, CKMBINDEX, TROPONINI in the last 168 hours. BNP: BNP (last 3 results) Recent Labs    06/08/21 0407  BNP 995.8*    ProBNP (last 3 results) No results for input(s): PROBNP in the last 8760 hours.  CBG: Recent Labs  Lab 06/10/21 0020 06/10/21 0416 06/10/21 0811 06/10/21 1210 06/10/21 1650  GLUCAP 82 80 84 118* 171*    Principal Problem:   Perirectal abscess Active Problems:   Diabetes ([place])   Hyperlipidemia   Depression   Essential hypertension   COPD GOLD II / AB component    Benign prostatic hyperplasia   Biventricular cardiac pacemaker in situ   CHF (congestive heart failure) (HCC)   CKD (chronic kidney disease), stage III (HCC)   Constipation, chronic   Dementia (HCC)   Decreased cardiac ejection fraction   Time coordinating discharge: 38 minutes.  Signed:        Asahel Risden, DO [facility]  06/10/2021, 5:30 PM

## 2021-06-10 NOTE — Progress Notes (Signed)
2 Days Post-Op   Chief Complaint/Subjective: No pain from surgery. Several bowel movements yesterday. Catheter removed this am and he has not voided yet. Has not ambulated yet today  Daughter is bedside  Objective: Vital signs in last 24 hours: Temp:  [98 F (36.7 C)-98.3 F (36.8 C)] 98 F (36.7 C) (01/12 0418) Pulse Rate:  [67-74] 70 (01/12 0418) Resp:  [18] 18 (01/12 0418) BP: (130-147)/(64-69) 141/64 (01/12 0418) SpO2:  [95 %-96 %] 96 % (01/12 0418) Last BM Date: 06/09/21 Intake/Output from previous day: 01/11 0701 - 01/12 0700 In: -  Out: 500 [Urine:500] Intake/Output this shift: No intake/output data recorded.  PE: Gen: NAD, resting in bed comfortably Resp: nonlabored on room air Card: regular rate Rectal: drain in place, minimal purulent bloody drainage on pad  Lab Results:  Recent Labs    06/08/21 0407 06/09/21 0332  WBC 11.8* 10.8*  HGB 13.6 12.3*  HCT 41.8 38.2*  PLT 247 235    BMET Recent Labs    06/08/21 0407 06/09/21 0332  NA 137 133*  K 4.1 4.1  CL 102 103  CO2 25 21*  GLUCOSE 93 94  BUN 30* 26*  CREATININE 1.58* 1.44*  CALCIUM 8.4* 8.0*    PT/INR No results for input(s): LABPROT, INR in the last 72 hours. CMP     Component Value Date/Time   NA 133 (L) 06/09/2021 0332   NA 138 08/11/2020 0959   K 4.1 06/09/2021 0332   CL 103 06/09/2021 0332   CO2 21 (L) 06/09/2021 0332   GLUCOSE 94 06/09/2021 0332   BUN 26 (H) 06/09/2021 0332   BUN 43 (H) 08/11/2020 0959   CREATININE 1.44 (H) 06/09/2021 0332   CREATININE 1.39 (H) 07/05/2019 1538   CALCIUM 8.0 (L) 06/09/2021 0332   PROT 7.2 06/07/2021 1122   ALBUMIN 3.2 (L) 06/07/2021 1122   AST 31 06/07/2021 1122   ALT 29 06/07/2021 1122   ALKPHOS 81 06/07/2021 1122   BILITOT 1.2 06/07/2021 1122   GFRNONAA 46 (L) 06/09/2021 0332   GFRAA 46 (L) 03/07/2019 0322   Lipase     Component Value Date/Time   LIPASE 35 10/03/2020 1730    Studies/Results: ECHOCARDIOGRAM COMPLETE  Result  Date: 06/09/2021    ECHOCARDIOGRAM REPORT   Patient Name:   KONNER SAIZ Date of Exam: 06/09/2021 Medical Rec #:  [id number]       Height:       72.0 in Accession #:    [id number]      Weight:       174.8 lb Date of Birth:  [birth year]       BSA:          2.012 m Patient Age:    88 years        BP:           135/65 mmHg Patient Gender: M               HR:           67 bpm. Exam Location:  Inpatient Procedure: 2D Echo, Cardiac Doppler and Color Doppler Indications:    Pericardial effusion, CHF  History:        Patient has prior history of Echocardiogram examinations, most                 recent 06/02/2020. COPD, Arrythmias:Bradycardia; Risk                 Factors:Hypertension and Diabetes.  Sonographer:  Glo Herring Referring Phys: [id number] [place]  1. Left ventricular ejection fraction, by estimation, is 50 to 55%. The left ventricle has low normal function. The left ventricle demonstrates global hypokinesis. There is mild concentric left ventricular hypertrophy. Left ventricular diastolic function could not be evaluated. Elevated left ventricular end-diastolic pressure.  2. Right ventricular systolic function is normal. The right ventricular size is normal.  3. Left atrial size was mildly dilated.  4. The mitral valve is degenerative. Trivial mitral valve regurgitation. Mild to moderate mitral stenosis. The mean mitral valve gradient is 3.0 mmHg. Severe mitral annular calcification.  5. The aortic valve is calcified. Aortic valve regurgitation is mild. Mild to moderate aortic valve stenosis. Aortic valve area, by VTI measures 0.97 cm. Aortic valve mean gradient measures 10.0 mmHg. Aortic valve Vmax measures 2.16 m/s.  6. The inferior vena cava is normal in size with greater than 50% respiratory variability, suggesting right atrial pressure of 3 mmHg.  7. Recommend definity contrast study. The endocardial borders are poorly defined in some views and cannot accurately assess EF. Gross  estimate of EF is around 50% which is improved from prior study, but again, images are not optimal. Ther AV is not well  visualized. The DI and low SVI support some degree of low flow low gradient AS. Could consider TEE. FINDINGS  Left Ventricle: Left ventricular ejection fraction, by estimation, is 50 to 55%. The left ventricle has low normal function. The left ventricle demonstrates global hypokinesis. The left ventricular internal cavity size was normal in size. There is mild concentric left ventricular hypertrophy. Left ventricular diastolic function could not be evaluated due to abnormal septal motion. Left ventricular diastolic function could not be evaluated. Elevated left ventricular end-diastolic pressure. Right Ventricle: The right ventricular size is normal. No increase in right ventricular wall thickness. Right ventricular systolic function is normal. Left Atrium: Left atrial size was mildly dilated. Right Atrium: Right atrial size was normal in size. Pericardium: There is no evidence of pericardial effusion. Mitral Valve: The mitral valve is degenerative in appearance. Severe mitral annular calcification. Trivial mitral valve regurgitation. Mild to moderate mitral valve stenosis. MV peak gradient, 5.9 mmHg. The mean mitral valve gradient is 3.0 mmHg. Tricuspid Valve: The tricuspid valve is normal in structure. Tricuspid valve regurgitation is not demonstrated. No evidence of tricuspid stenosis. Aortic Valve: The aortic valve is calcified. Aortic valve regurgitation is mild. Mild to moderate aortic stenosis is present. Aortic valve mean gradient measures 10.0 mmHg. Aortic valve peak gradient measures 18.7 mmHg. Aortic valve area, by VTI measures  0.97 cm. Pulmonic Valve: The pulmonic valve was normal in structure. Pulmonic valve regurgitation is trivial. No evidence of pulmonic stenosis. Aorta: The aortic root is normal in size and structure. Venous: The inferior vena cava is normal in size with greater  than 50% respiratory variability, suggesting right atrial pressure of 3 mmHg. IAS/Shunts: No atrial level shunt detected by color flow Doppler. Additional Comments: A device lead is visualized.  LEFT VENTRICLE PLAX 2D LVIDd:         4.00 cm     Diastology LVIDs:         2.90 cm     LV e' medial:    4.90 cm/s LV PW:         1.20 cm     LV E/e' medial:  22.4 LV IVS:        1.20 cm     LV e' lateral:   4.19 cm/s LVOT  diam:     1.80 cm     LV E/e' lateral: 26.3 LV SV:         45 LV SV Index:   23 LVOT Area:     2.54 cm  LV Volumes (MOD) LV vol d, MOD A2C: 83.4 ml LV vol d, MOD A4C: 87.1 ml LV vol s, MOD A2C: 57.4 ml LV vol s, MOD A4C: 59.5 ml LV SV MOD A2C:     26.0 ml LV SV MOD A4C:     87.1 ml LV SV MOD BP:      25.3 ml LEFT ATRIUM         Index LA diam:    4.10 cm 2.04 cm/m  AORTIC VALVE                     PULMONIC VALVE AV Area (Vmax):    0.90 cm      PV Vmax:       0.78 m/s AV Area (Vmean):   0.93 cm      PV Peak grad:  2.4 mmHg AV Area (VTI):     0.97 cm AV Vmax:           216.50 cm/s AV Vmean:          142.000 cm/s AV VTI:            0.468 m AV Peak Grad:      18.7 mmHg AV Mean Grad:      10.0 mmHg LVOT Vmax:         76.40 cm/s LVOT Vmean:        51.650 cm/s LVOT VTI:          0.178 m LVOT/AV VTI ratio: 0.38  AORTA Ao Root diam: 3.30 cm Ao Asc diam:  2.60 cm MITRAL VALVE MV Area (PHT): 3.16 cm     SHUNTS MV Area VTI:   1.15 cm     Systemic VTI:  0.18 m MV Peak grad:  5.9 mmHg     Systemic Diam: 1.80 cm MV Mean grad:  3.0 mmHg MV Vmax:       1.21 m/s MV Vmean:      81.4 cm/s MV Decel Time: 240 msec MV E velocity: 110.00 cm/s MV A velocity: 109.00 cm/s MV E/A ratio:  1.01 Fransico Him MD Electronically signed by Fransico Him MD Signature Date/Time: 06/09/2021/1:25:50 PM    Final     Anti-infectives: Anti-infectives (From admission, onward)    Start     Dose/Rate Route Frequency Ordered Stop   06/10/21 1200  vancomycin (VANCOREADY) IVPB 1250 mg/250 mL  Status:  Discontinued        1,250 mg 166.7 mL/hr  over 90 Minutes Intravenous Every 36 hours 06/08/21 0151 06/08/21 1329   06/09/21 1800  azithromycin (ZITHROMAX) tablet 500 mg        500 mg Oral Daily 06/09/21 1331 06/12/21 0959   06/08/21 2000  azithromycin (ZITHROMAX) 500 mg in sodium chloride 0.9 % 250 mL IVPB  Status:  Discontinued        500 mg 250 mL/hr over 60 Minutes Intravenous Daily 06/08/21 1329 06/09/21 1331   06/08/21 1100  ceFAZolin (ANCEF) IVPB 2g/100 mL premix       See Hyperspace for full Linked Orders Report.   2 g 200 mL/hr over 30 Minutes Intravenous On call to O.R. 06/07/21 2026 06/08/21 1317   06/08/21 1100  metroNIDAZOLE (FLAGYL) IVPB 500 mg  See Hyperspace for full Linked Orders Report.   500 mg 100 mL/hr over 60 Minutes Intravenous On call to O.R. 06/07/21 2026 06/08/21 1320   06/07/21 2230  vancomycin (VANCOREADY) IVPB 1500 mg/300 mL        1,500 mg 150 mL/hr over 120 Minutes Intravenous  Once 06/07/21 2044 06/08/21 0248   06/07/21 2200  piperacillin-tazobactam (ZOSYN) IVPB 3.375 g        3.375 g 12.5 mL/hr over 240 Minutes Intravenous Every 8 hours 06/07/21 2034 06/12/21 2159   06/07/21 1530  azithromycin (ZITHROMAX) 500 mg in sodium chloride 0.9 % 250 mL IVPB        500 mg 250 mL/hr over 60 Minutes Intravenous  Once 06/07/21 1523 06/07/21 1710   06/07/21 1315  cefTRIAXone (ROCEPHIN) 1 g in sodium chloride 0.9 % 100 mL IVPB  Status:  Discontinued        1 g 200 mL/hr over 30 Minutes Intravenous Every 24 hours 06/07/21 1302 06/07/21 2034   06/07/21 1315  metroNIDAZOLE (FLAGYL) IVPB 500 mg        500 mg 100 mL/hr over 60 Minutes Intravenous  Once 06/07/21 1302 06/07/21 1505       Assessment/Plan  s/p Procedure(s): INCISION AND DRAINAGE ABSCESS 06/08/2021 by Dr. Kieth Brightly - doing well postoperatively - difficulty urinating - foley replaced yesterday and out this am. Has not voided yet - monitor for retention. Discussed with patient and daughter possibility of catheter replacement and urology follow  up outpatient - started on flomax - encouraged ambulation   FEN - heart healthy VTE - SCDs ID - zosyn 1/9--> Disposition - arranging home health for wound assistance, no packing needed. Continue sitz baths. Patient lives alone, but is ready for discharge from surgery standpoint pending voiding function   LOS: 3 days   I reviewed hospitalist notes, last 24 h vitals and pain scores, last 48 h intake and output, and last 24 h labs and trends.  This care required moderate level of medical decision making.   [facility] 06/10/2021, 10:00 AM Please see Amion for pager number during day hours 7:00am-4:30pm or 7:00am -11:30am on weekends

## 2021-06-10 NOTE — Progress Notes (Signed)
Pt discharged to home, instructions reviewed with pt and dtg, acknowledged understanding of instructions and prescriptions and care of wound. Supplies given to pt and dtg to assist with care of wound. Pt and dtg instructed on foley care and how to empty foley bag. Follow up with Urology is planned for one week. SRP, RN

## 2021-06-10 NOTE — Progress Notes (Signed)
Pt unable to void post foley removal, pt bladder scanned, 253 ml and 244 ml noted. Foley will be placed per MD and pt to follow up with Urology as planned.SRP, RN

## 2021-06-11 ENCOUNTER — Telehealth: Payer: Self-pay

## 2021-06-11 NOTE — Telephone Encounter (Signed)
Transition Care Management Unsuccessful Follow-up Telephone Call  Date of discharge and from where:  [facility] 06/10/2021  Attempts:  1st Attempt  Reason for unsuccessful TCM follow-up call:  Unable to leave message

## 2021-06-14 LAB — AEROBIC/ANAEROBIC CULTURE W GRAM STAIN (SURGICAL/DEEP WOUND)

## 2021-06-14 NOTE — Telephone Encounter (Signed)
Transition Care Management Follow-up Telephone Call Date of discharge and from where: [facility] 06-10-21 Dx: Perirectal/perianal abscess How have you been since you were released from the hospital? Doing ok Any questions or concerns? No  Items Reviewed: Did the pt receive and understand the discharge instructions provided? Yes  Medications obtained and verified? Yes  Other? No  Any new allergies since your discharge? No  Dietary orders reviewed? Yes Do you have support at home? Yes   Home Care and Equipment/Supplies: Were home health services ordered? no If so, what is the name of the agency? NA  Has the agency set up a time to come to the patient's home? not applicable Were any new equipment or medical supplies ordered?  No What is the name of the medical supply agency? NA Were you able to get the supplies/equipment? no Do you have any questions related to the use of the equipment or supplies? No  Functional Questionnaire: (I = Independent and D = Dependent) ADLs: I  Bathing/Dressing- I  Meal Prep- I  Eating- I  Maintaining continence- I  Transferring/Ambulation- I  Managing Meds- I  Follow up appointments reviewed:  PCP Hospital f/u appt confirmed? Yes  [facility] Reichmann  [facility]  Scheduled to see Dr Patrick North  on 06-18-21 @ 9am and surgeon 06-17-21 at 345pm. Are transportation arrangements needed? No  If their condition worsens, is the pt aware to call PCP or go to the Emergency Dept.? Yes Was the patient provided with contact information for the PCP's office or ED? Yes Was to pt encouraged to call back with questions or concerns? Yes

## 2021-06-18 DIAGNOSIS — N401 Enlarged prostate with lower urinary tract symptoms: Secondary | ICD-10-CM | POA: Diagnosis not present

## 2021-06-18 DIAGNOSIS — R338 Other retention of urine: Secondary | ICD-10-CM | POA: Diagnosis not present

## 2021-06-22 ENCOUNTER — Encounter: Payer: Self-pay | Admitting: Internal Medicine

## 2021-06-22 ENCOUNTER — Ambulatory Visit: Payer: Medicare Other | Admitting: Internal Medicine

## 2021-06-22 ENCOUNTER — Other Ambulatory Visit: Payer: Self-pay

## 2021-06-22 VITALS — BP 142/70 | HR 82 | Temp 98.0°F | Ht 72.0 in | Wt 157.0 lb

## 2021-06-22 DIAGNOSIS — K611 Rectal abscess: Secondary | ICD-10-CM | POA: Diagnosis not present

## 2021-06-22 DIAGNOSIS — I10 Essential (primary) hypertension: Secondary | ICD-10-CM | POA: Diagnosis not present

## 2021-06-22 DIAGNOSIS — N1832 Chronic kidney disease, stage 3b: Secondary | ICD-10-CM | POA: Diagnosis not present

## 2021-06-22 MED ORDER — AZITHROMYCIN 250 MG PO TABS: 500.0000 mg | ORAL_TABLET | Freq: Every day | ORAL | 0 refills | Status: DC

## 2021-06-22 NOTE — Assessment & Plan Note (Signed)
BP Readings from Last 3 Encounters:  06/22/21 (!) 142/70  06/10/21 130/89  03/18/21 132/80   Mild uncontrolled, likely reactive, pt to continue medical treatment toprol, f/u bp at home and next visit

## 2021-06-22 NOTE — Assessment & Plan Note (Signed)
Lab Results  Component Value Date   CREATININE 1.44 (H) 06/09/2021   Stable overall, cont to avoid nephrotoxins

## 2021-06-22 NOTE — Patient Instructions (Signed)
Please take all new medication as prescribed 

## 2021-06-22 NOTE — Progress Notes (Signed)
Patient ID: Daniel Woodard, male   DOB: Jan 26, 1930, 88 y.o.   MRN: [id number]        Chief Complaint: follow up recent perirectal abscess       HPI:  Daniel Woodard is a 88 y.o. male here with daughter with recent perirectal abscess essentially resolved after hospn jan 9 -12 and f/u with general surgury with drain removed and finished oral antibx which he tolerated well.  Today c/o sore tenderness area to left ischial tuberosity area starting again without fever, chills, skin change or drainage.  Pt denies chest pain, increased sob or doe, wheezing, orthopnea, PND, increased LE swelling, palpitations, dizziness or syncope.   Pt denies polydipsia, polyuria, or new focal neuro s/s.   Pt denies fever, wt loss, night sweats, loss of appetite, or other constitutional symptoms.  BP at home has been < 140/90      Wt Readings from Last 3 Encounters:  06/22/21 157 lb (71.2 kg)  06/09/21 174 lb 13.2 oz (79.3 kg)  03/18/21 170 lb 3.2 oz (77.2 kg)   BP Readings from Last 3 Encounters:  06/22/21 (!) 142/70  06/10/21 130/89  03/18/21 132/80         Past Medical History:  Diagnosis Date   ALLERGIC RHINITIS 04/09/2007   Atrial fibrillation ([place])    BACTEREMIA, 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   BENIGN PROSTATIC HYPERTROPHY 04/09/2007   Blood transfusion without reported diagnosis    really not sure thinks 30-40 years ago   BRADYCARDIA 05/11/2009   Carotid artery occlusion    COLONIC POLYPS, HX OF 04/09/2007   ADENOMATOUS POLYPS 2004,2008   Complete heart block ([place])    COPD 04/09/2007   Cough 03/27/2008   DEPRESSION 04/09/2007   DIABETES MELLITUS, TYPE II 10/09/2007   DIVERTICULOSIS, COLON 04/09/2007   DIZZINESS 10/06/2008   GLUCOSE INTOLERANCE 04/09/2007   HYPERLIPIDEMIA 03/27/2007   HYPERTENSION 03/27/2007   Kidney stone    PERIPHERAL VASCULAR DISEASE 03/27/2007   PNEUMONIA ORGANISM NOS 11/08/2007   SHINGLES, HX OF 04/09/2007   SHOULDER PAIN, BILATERAL 08/01/2007   Ulcer    40  years ago   Past Surgical History:  Procedure Laterality Date   APPENDECTOMY     88 years old   BIV UPGRADE N/A 08/14/2020   Procedure: BIV PPM UPGRADE;  Surgeon: Evans Lance, MD;  Location: [facility];  Service: Cardiovascular;  Laterality: N/A;   CAROTID ENDARTERECTOMY  5/08   left   CARPAL TUNNEL RELEASE     bilateral   COLONOSCOPY     ESOPHAGOGASTRODUODENOSCOPY  06/07/2004   HEMORRHOID BANDING  1990s   INCISION AND DRAINAGE ABSCESS N/A 06/08/2021   Procedure: INCISION AND DRAINAGE ABSCESS;  Surgeon: Kieth Brightly Arta Bruce, MD;  Location: [facility];  Service: General;  Laterality: N/A;   LEFT HEART CATH AND CORONARY ANGIOGRAPHY N/A 06/02/2020   Procedure: LEFT HEART CATH AND CORONARY ANGIOGRAPHY;  Surgeon: Jettie Booze, MD;  Location: [facility];  Service: Cardiovascular;  Laterality: N/A;   PACEMAKER INSERTION  07/11/2013   MDT ADDRL1 pacemaker implanted by Dr Lovena Le for complete heart block   PERMANENT PACEMAKER INSERTION N/A 07/11/2013   Procedure: PERMANENT PACEMAKER INSERTION;  Surgeon: Evans Lance, MD;  Location: [facility];  Service: Cardiovascular;  Laterality: N/A;   s/p PUD surgury     ? partial gastrectomy    reports that he quit smoking about 21 years ago. His smoking use included cigarettes. He has a 50.00 pack-year  smoking history. He has never used smokeless tobacco. He reports that he does not drink alcohol and does not use drugs. family history includes Heart disease in his sister and another family member; Hypertension in his father, mother, sister, sister, sister, and sister; Lung cancer in his sister; Peripheral vascular disease in his brother; Stroke in an other family member; Ulcers in his father and sister. Allergies  Allergen Reactions   Ace Inhibitors Cough        Current Outpatient Medications on File Prior to Visit  Medication Sig Dispense Refill   ASPIRIN 81 PO Take 81 mg by mouth daily.     furosemide (LASIX) 40 MG tablet Take 1  tablet by mouth once daily (Patient taking differently: Take 40 mg by mouth daily.) 90 tablet 1   Magnesium 200 MG TABS Take 1 tablet (200 mg total) by mouth 2 (two) times daily. 60 tablet 0   metoprolol succinate (TOPROL-XL) 25 MG 24 hr tablet Take 1/2 (one-half) tablet by mouth once daily (Patient taking differently: 12.5 mg daily. Take 1/2 (one-half) tablet by mouth once daily) 45 tablet 3   omeprazole (PRILOSEC) 20 MG capsule Take 1 capsule by mouth once daily (Patient taking differently: 20 mg daily.) 90 capsule 3   potassium chloride SA (KLOR-CON) 20 MEQ tablet Take 1 tablet (20 mEq total) by mouth daily. 90 tablet 3   Psyllium (METAMUCIL PO) Take 2 capsules by mouth daily as needed (constipation).     QUERCETIN PO Take 3 tablets by mouth 2 (two) times daily. With Vitamin C     rosuvastatin (CRESTOR) 20 MG tablet Take 1 tablet (20 mg total) by mouth at bedtime. 30 tablet 0   tamsulosin (FLOMAX) 0.4 MG CAPS capsule Take 1 capsule (0.4 mg total) by mouth daily. 30 capsule 0   VITAMIN D PO Take 5,000 Units by mouth daily.     zinc gluconate 50 MG tablet Take 50 mg by mouth 2 (two) times daily.     docusate sodium (COLACE) 100 MG capsule Take 1 capsule (100 mg total) by mouth 2 (two) times daily. (Patient not taking: Reported on 06/14/2021) 10 capsule 0   linaclotide (LINZESS) 72 MCG capsule Take 1 capsule (72 mcg total) by mouth daily before breakfast. (Patient not taking: Reported on 06/22/2021) 30 capsule 5   polyethylene glycol (MIRALAX / GLYCOLAX) 17 g packet Take 17 g by mouth daily. (Patient not taking: Reported on 06/14/2021) 14 each 0   simethicone (MYLICON) 40 AL/9.3XT drops Take 1.2 mLs (80 mg total) by mouth 4 (four) times daily as needed for flatulence (PRN bloating, gassiness). (Patient not taking: Reported on 06/14/2021) 30 mL 0   traMADol (ULTRAM) 50 MG tablet Take 1 tablet (50 mg total) by mouth every 6 (six) hours as needed for moderate pain. (Patient not taking: Reported on 06/22/2021)  12 tablet 0   No current facility-administered medications on file prior to visit.        ROS:  All others reviewed and negative.  Objective        PE:  BP (!) 142/70 (BP Location: Right Arm, Patient Position: Sitting, Cuff Size: Normal)    Pulse 82    Temp 98 F (36.7 C) (Oral)    Ht 6' (1.829 m)    Wt 157 lb (71.2 kg)    SpO2 97%    BMI 21.29 kg/m                 Constitutional: Pt appears in NAD  HENT: Head: NCAT.                Right Ear: External ear normal.                 Left Ear: External ear normal.                Eyes: . Pupils are equal, round, and reactive to light. Conjunctivae and EOM are normal               Nose: without d/c or deformity               Neck: Neck supple. Gross normal ROM               Cardiovascular: Normal rate and regular rhythm.                 Pulmonary/Chest: Effort normal and breath sounds without rales or wheezing.                Abd:  Soft, NT, ND, + BS, no organomegaly               Neurological: Pt is alert. At baseline orientation, motor grossly intact               Skin: Skin is warm. No rashes, no other new lesions, LE edema - none               Left buttock with ischial tuberosity area 1.5 x 1/2 cm area sub1 tender firmish nodule without overlying skin change or ulcer               Psychiatric: Pt behavior is normal without agitation   Micro: none  Cardiac tracings I have personally interpreted today:  none  Pertinent Radiological findings (summarize): none   Lab Results  Component Value Date   WBC 10.8 (H) 06/09/2021   HGB 12.3 (L) 06/09/2021   HCT 38.2 (L) 06/09/2021   PLT 235 06/09/2021   GLUCOSE 94 06/09/2021   CHOL 161 03/18/2021   TRIG 129.0 03/18/2021   HDL 39.30 03/18/2021   LDLDIRECT 189.9 10/02/2007   LDLCALC 96 03/18/2021   ALT 29 06/07/2021   AST 31 06/07/2021   NA 133 (L) 06/09/2021   K 4.1 06/09/2021   CL 103 06/09/2021   CREATININE 1.44 (H) 06/09/2021   BUN 26 (H) 06/09/2021   CO2 21 (L)  06/09/2021   TSH 2.473 06/08/2021   PSA 0.92 11/20/2019   INR 1.1 03/06/2019   HGBA1C 5.9 (H) 06/08/2021   MICROALBUR 10.1 (H) 03/18/2021   Assessment/Plan:  Daniel Woodard is a 88 y.o. White or Caucasian [1] male with  has a past medical history of ALLERGIC RHINITIS (04/09/2007), Atrial fibrillation ([place]), BACTEREMIA, MYCOBACTERIUM AVIUM COMPLEX (04/09/2007), BENIGN PROSTATIC HYPERTROPHY (04/09/2007), Blood transfusion without reported diagnosis, BRADYCARDIA (05/11/2009), Carotid artery occlusion, COLONIC POLYPS, HX OF (04/09/2007), Complete heart block ([place]), COPD (04/09/2007), Cough (03/27/2008), DEPRESSION (04/09/2007), DIABETES MELLITUS, TYPE II (10/09/2007), DIVERTICULOSIS, COLON (04/09/2007), DIZZINESS (10/06/2008), GLUCOSE INTOLERANCE (04/09/2007), HYPERLIPIDEMIA (03/27/2007), HYPERTENSION (03/27/2007), Kidney stone, PERIPHERAL VASCULAR DISEASE (03/27/2007), PNEUMONIA ORGANISM NOS (11/08/2007), SHINGLES, HX OF (04/09/2007), SHOULDER PAIN, BILATERAL (08/01/2007), and Ulcer.  Perirectal abscess Resolved, now with new subq tender nodular abnormality cant r/o early infectious, for zpack  CKD (chronic kidney disease), stage III ([place]) Lab Results  Component Value Date   CREATININE 1.44 (H) 06/09/2021   Stable overall, cont to avoid nephrotoxins   Essential hypertension BP Readings from Last 3 Encounters:  06/22/21 (!) 142/70  06/10/21 130/89  03/18/21 132/80   Mild uncontrolled, likely reactive, pt to continue medical treatment toprol, f/u bp at home and next visit  Followup: Return if symptoms worsen or fail to improve.  Cathlean Cower, MD 06/22/2021 9:13 PM [facility]

## 2021-06-22 NOTE — Assessment & Plan Note (Signed)
Resolved, now with new subq tender nodular abnormality cant r/o early infectious, for zpack

## 2021-06-24 DIAGNOSIS — L98499 Non-pressure chronic ulcer of skin of other sites with unspecified severity: Secondary | ICD-10-CM | POA: Diagnosis not present

## 2021-07-06 NOTE — Progress Notes (Signed)
Cardiology Clinic Note   Patient Name: Daniel Woodard Date of Encounter: 07/08/2021  Primary Care Provider:  Biagio Borg, MD Primary Cardiologist:  Sanda Klein, MD  Patient Profile    Daniel Woodard 88 year old male presents to the clinic today for follow-up evaluation of his CHF and hypertension  Past Medical History    Past Medical History:  Diagnosis Date   ALLERGIC RHINITIS 04/09/2007   Atrial fibrillation ([place])    BACTEREMIA, 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   BENIGN PROSTATIC HYPERTROPHY 04/09/2007   Blood transfusion without reported diagnosis    really not sure thinks 30-40 years ago   BRADYCARDIA 05/11/2009   Carotid artery occlusion    COLONIC POLYPS, HX OF 04/09/2007   ADENOMATOUS POLYPS 2004,2008   Complete heart block ([place])    COPD 04/09/2007   Cough 03/27/2008   DEPRESSION 04/09/2007   DIABETES MELLITUS, TYPE II 10/09/2007   DIVERTICULOSIS, COLON 04/09/2007   DIZZINESS 10/06/2008   GLUCOSE INTOLERANCE 04/09/2007   HYPERLIPIDEMIA 03/27/2007   HYPERTENSION 03/27/2007   Kidney stone    PERIPHERAL VASCULAR DISEASE 03/27/2007   PNEUMONIA ORGANISM NOS 11/08/2007   SHINGLES, HX OF 04/09/2007   SHOULDER PAIN, BILATERAL 08/01/2007   Ulcer    40 years ago   Past Surgical History:  Procedure Laterality Date   APPENDECTOMY     88 years old   BIV UPGRADE N/A 08/14/2020   Procedure: BIV PPM UPGRADE;  Surgeon: Evans Lance, MD;  Location: [facility];  Service: Cardiovascular;  Laterality: N/A;   CAROTID ENDARTERECTOMY  5/08   left   CARPAL TUNNEL RELEASE     bilateral   COLONOSCOPY     ESOPHAGOGASTRODUODENOSCOPY  06/07/2004   HEMORRHOID BANDING  1990s   INCISION AND DRAINAGE ABSCESS N/A 06/08/2021   Procedure: INCISION AND DRAINAGE ABSCESS;  Surgeon: Kieth Brightly Arta Bruce, MD;  Location: [facility];  Service: General;  Laterality: N/A;   LEFT HEART CATH AND CORONARY ANGIOGRAPHY N/A 06/02/2020   Procedure: LEFT HEART CATH AND CORONARY  ANGIOGRAPHY;  Surgeon: Jettie Booze, MD;  Location: [facility];  Service: Cardiovascular;  Laterality: N/A;   PACEMAKER INSERTION  07/11/2013   MDT ADDRL1 pacemaker implanted by Dr Lovena Le for complete heart block   PERMANENT PACEMAKER INSERTION N/A 07/11/2013   Procedure: PERMANENT PACEMAKER INSERTION;  Surgeon: Evans Lance, MD;  Location: [facility];  Service: Cardiovascular;  Laterality: N/A;   s/p PUD surgury     ? partial gastrectomy    Allergies  Allergies  Allergen Reactions   Ace Inhibitors Cough         History of Present Illness    Daniel Woodard is a 88 y.o. male who presents via audio/video conferencing for a telehealth visit today.  Patient verified DOB and address.   Mr. Sandler has a PMH of hypertension, diabetes mellitus type 2, COPD, hyperlipidemia, CKD stage III, 2-1 AV block status post pacemaker insertion in 2015, carotid artery stenosis status post left CEA 2001, and known chronic left subclavian artery occlusion.   He [facility] department [facility]/01/2020 with increasing shortness of breath 3-4 days .  He underwent cardiac catheterization which showed nonobstructive CAD.  It was felt that his new reduction in LVEF/cardiomyopathy was related to RV apical pacing- induced dyssynchrony.  He was referred to Dr. Lovena Le for evaluation for CRT-P upgrade.  He was educated about daily weights and sodium restriction.  He was instructed to contact the office  with a weight increase of 3 pounds overnight on his home scale.   He was seen 06/15/20 for follow-up evaluation stated he felt well.  He was back to his normal daily activities.  He also stated he had been monitoring the salt in his diet.  We reviewed his appointment date/time with electrophysiology.  He expressed understanding.  I gave him a salty 6 diet sheet, asked him to continue to increase his physical activity as tolerated, and planned follow-up with Dr. Sallyanne Kuster in 3 months.   He was  seen by Dr. Lovena Le on 06/18/2020.  During that time he continued to follow a low-sodium diet.  He denied syncope and lower extremity edema.  He denied chest discomfort.  He reported that he was somewhat sedentary.  Cardiac recent urination was reviewed.  His Medtronic PPM was working normally.  He was encouraged to consider upgrade to BiV PPM.  He reported he would contact the office if he decided to pursue upgrade.  He presented for follow-up with Dr. Lovena Le 11/18/2020.  He had undergone BiV pacemaker upgrade.  His PPM was functioning normally.  It was felt to be NYHA class II.  He presents to the clinic today for follow-up evaluation states he feels well.  He is fairly sedentary according to his daughter.  He will walk at [employer] once per month without difficulty.  He also occasionally visits his wife's grave site and walks around [place].  He is following a strict low-salt diet and most of his meals are made from scratch.  He is very pleased with how he feels since his surgery (06/08/2021 perirectal abscess),.  We will continue his current medication regimen, have him increase his physical activity as tolerated, I have asked him to walk for 10 to 15 minutes/day, and we will have him follow-up with Dr. Sallyanne Kuster in 6 months.  Today he denies chest pain, shortness of breath, lower extremity edema, fatigue, palpitations, melena, hematuria, hemoptysis, diaphoresis, weakness, presyncope, syncope, orthopnea, and PND.  Home Medications    Prior to Admission medications   Medication Sig Start Date End Date Taking? Authorizing Provider  ASPIRIN 81 PO Take 81 mg by mouth daily.    [provider]  azithromycin (ZITHROMAX) 250 MG tablet Take 2 tablets (500 mg total) by mouth daily. 06/22/21   Biagio Borg, MD  docusate sodium (COLACE) 100 MG capsule Take 1 capsule (100 mg total) by mouth 2 (two) times daily. Patient not taking: Reported on 06/14/2021 06/10/21   Swayze, Ava, DO  furosemide (LASIX) 40 MG  tablet Take 1 tablet by mouth once daily Patient taking differently: Take 40 mg by mouth daily. 08/20/20   Biagio Borg, MD  linaclotide Rolan Lipa) 72 MCG capsule Take 1 capsule (72 mcg total) by mouth daily before breakfast. Patient not taking: Reported on 06/22/2021 10/05/20   Biagio Borg, MD  Magnesium 200 MG TABS Take 1 tablet (200 mg total) by mouth 2 (two) times daily. 06/10/21   Swayze, Ava, DO  metoprolol succinate (TOPROL-XL) 25 MG 24 hr tablet Take 1/2 (one-half) tablet by mouth once daily Patient taking differently: 12.5 mg daily. Take 1/2 (one-half) tablet by mouth once daily 11/18/20   Biagio Borg, MD  omeprazole (PRILOSEC) 20 MG capsule Take 1 capsule by mouth once daily Patient taking differently: 20 mg daily. 11/18/20   Biagio Borg, MD  polyethylene glycol (MIRALAX / GLYCOLAX) 17 g packet Take 17 g by mouth daily. Patient not taking: Reported on 06/14/2021  06/11/21   Swayze, Ava, DO  potassium chloride SA (KLOR-CON) 20 MEQ tablet Take 1 tablet (20 mEq total) by mouth daily. 08/11/20   Evans Lance, MD  Psyllium (METAMUCIL PO) Take 2 capsules by mouth daily as needed (constipation).    [provider]  QUERCETIN PO Take 3 tablets by mouth 2 (two) times daily. With Vitamin C    [provider]  rosuvastatin (CRESTOR) 20 MG tablet Take 1 tablet (20 mg total) by mouth at bedtime. 06/10/21   Swayze, Ava, DO  simethicone (MYLICON) 40 JM/4.2AS drops Take 1.2 mLs (80 mg total) by mouth 4 (four) times daily as needed for flatulence (PRN bloating, gassiness). Patient not taking: Reported on 06/14/2021 06/10/21   Swayze, Ava, DO  tamsulosin (FLOMAX) 0.4 MG CAPS capsule Take 1 capsule (0.4 mg total) by mouth daily. 06/11/21   Swayze, Ava, DO  traMADol (ULTRAM) 50 MG tablet Take 1 tablet (50 mg total) by mouth every 6 (six) hours as needed for moderate pain. Patient not taking: Reported on 06/22/2021 06/10/21   Swayze, Ava, DO  VITAMIN D PO Take 5,000 Units by mouth daily.     [provider]  zinc gluconate 50 MG tablet Take 50 mg by mouth 2 (two) times daily.    [provider]    Family History    Family History  Problem Relation Age of Onset   Hypertension Father    Ulcers Father    Hypertension Mother    Hypertension Sister    Peripheral vascular disease Brother    Hypertension Sister    Ulcers Sister        Ulcers   Hypertension Sister    Lung cancer Sister        smoked   Heart disease Sister        Carotid    Hypertension Sister    Heart disease Other        Cardiovascular disorder, CHF   Stroke Other        1st degree relative male and male   Colon cancer Neg Hx    He indicated that his mother is deceased. He indicated that his father is deceased. He indicated that three of his four sisters are alive. He indicated that his brother is deceased. He indicated that his maternal grandmother is deceased. He indicated that his maternal grandfather is deceased. He indicated that his paternal grandmother is deceased. He indicated that his paternal grandfather is deceased. He indicated that the status of his neg hx is unknown. He indicated that the status of his other is unknown.  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  Tobacco Use   Smoking status: Former    Packs/day: 1.00    Years: 50.00    Pack years: 50.00    Types: Cigarettes    Quit date: 08/20/1999    Years since quitting: 21.8   Smokeless tobacco: Never  Vaping Use   Vaping Use: Never used  Substance and Sexual Activity   Alcohol use: No    Alcohol/week: 0.0 standard drinks   Drug use: No   Sexual activity: Never  Other Topics Concern   Not on file  Social History Narrative   Widowed;  wife dies August 29, 2018   Daily caffeine    Social Determinants of Health   Financial Resource Strain: Low Risk    Difficulty of Paying Living Expenses: Not hard at all  Food Insecurity: No Food Insecurity   Worried About Charity fundraiser in the Last Year: Never true   [place] in the Last Year: Never true  Transportation Needs: No Transportation Needs   Lack of Transportation (Medical): No   Lack of Transportation (Non-Medical): No  Physical Activity: Insufficiently Active   Days of Exercise per Week: 3 days   Minutes of Exercise per Session: 10 min  Stress: No Stress Concern Present   Feeling of Stress : Not at all  Social Connections: Socially Isolated   Frequency of Communication with Friends and Family: More than three times a week   Frequency of Social Gatherings with Friends and Family: Twice a week   Attends Religious Services: Never   Marine scientist or Organizations: Not on file   Attends Archivist Meetings: Never   Marital Status: Widowed  Human resources officer Violence: Not At Risk   Fear of Current or Ex-Partner: No   Emotionally Abused: No   Physically Abused: No   Sexually Abused: No     Review of Systems    General:  No chills, fever, night sweats or weight changes.  Cardiovascular:  No chest pain, dyspnea on exertion, edema, orthopnea, palpitations, paroxysmal nocturnal dyspnea. Dermatological: No rash, lesions/masses Respiratory: No cough, dyspnea Urologic: No hematuria, dysuria Abdominal:   No nausea, vomiting, diarrhea, bright red blood per rectum, melena, or hematemesis Neurologic:  No visual changes, wkns, changes in mental status. All other systems reviewed and are otherwise negative except as noted above.  Physical Exam    VS:  BP 116/70    Pulse 76    Ht _0  (1.854 m)    Wt 167 lb (75.8 kg)    SpO2 100%    BMI 22.03 kg/m  , BMI Body mass index is 22.03 kg/m. GEN: Well nourished, well developed, in no acute distress. HEENT:  normal. Neck: Supple, no JVD, carotid bruits, or masses. Cardiac: RRR, no murmurs, rubs, or gallops. No clubbing, cyanosis, edema.  Radials/DP/PT 2+ and equal bilaterally.  Respiratory:  Respirations regular and unlabored, clear to auscultation bilaterally. GI: Soft, nontender, nondistended, BS + x 4. MS: no deformity or atrophy. Skin: warm and dry, no rash. Neuro:  Strength and sensation are intact. Psych: Normal affect.  Accessory Clinical Findings    Recent Labs: 06/07/2021: ALT 29 06/08/2021: B Natriuretic Peptide 995.8; TSH 2.473 06/09/2021: BUN 26; Creatinine, Ser 1.44; Hemoglobin 12.3; Magnesium 2.3; Platelets 235; Potassium 4.1; Sodium 133   Recent Lipid Panel    Component Value Date/Time   CHOL 161 03/18/2021 1156   TRIG 129.0 03/18/2021 1156   HDL 39.30 03/18/2021 1156   CHOLHDL 4 03/18/2021 1156   VLDL 25.8 03/18/2021 1156   LDLCALC 96 03/18/2021 1156   LDLDIRECT 189.9 10/02/2007 1107    ECG personally reviewed by me today-none today. Echocardiogram 06/02/2020 IMPRESSIONS     1. LV function is moderately reduced ~30-35%. The mid to distal seputm  into apex appears akinetic with global hypokinesis in other segments. The  RWMA may be pacer related but are a bit more than expected, and cannot  exclude LAD infarction.   2. Left ventricular ejection fraction, by estimation, is 30 to 35%. The  left ventricle  has moderately decreased function. The left ventricle  demonstrates regional wall motion abnormalities (see scoring  diagram/findings for description). There is mild  concentric left ventricular hypertrophy. Left ventricular diastolic  function could not be evaluated.   3. Right ventricular systolic function is mildly reduced. The right  ventricular size is moderately enlarged. There is moderately elevated  pulmonary artery systolic pressure. The estimated right ventricular  systolic pressure is 51.8 mmHg.   4. There appears to be mild to moderate calcific mitral  stenosis, but  doppler evaluation across the valve was not performed and severity cannot  be adequately assessed. Noted to be at least mild on last study. The  mitral valve is degenerative. Mild to  moderate mitral valve regurgitation. Severe mitral annular calcification.   5. Mild to moderate AS is present. V max 1.8 m/s, MG 8 mmHG, AVA 1.22  cm2, DI 0.39. SV index low due to cardiomyopathy (23 cc/m2). Visually  there is at least moderate AS and AVA/DI support this. The aortic valve is  calcified. Aortic valve regurgitation  is not visualized. Mild to moderate aortic valve stenosis.   6. The inferior vena cava is dilated in size with <50% respiratory  variability, suggesting right atrial pressure of 15 mmHg.   Comparison(s): Changes from prior study are noted. EF now 30-35% with  RWMA, mild to moderate AS present. RVSP now 64 mmHG.   Cardiac catheterization 06/02/2020 Prox Cx lesion is 25% stenosed. Mid LAD lesion is 50-60% stenosed. Heavily calcified at the origin of the diagonal. 2nd Diag lesion is 50% stenosed. LV end diastolic pressure is normal. There is no aortic valve stenosis.   Medical therapy given lack of angina.  Degree of LAD disease should not cause akinesis of apex. Diagnostic Dominance: Right     Echocardiogram 06/09/2021 IMPRESSIONS     1. Left ventricular ejection fraction, by estimation, is 50 to 55%. The  left ventricle has low normal function. The left ventricle demonstrates  global hypokinesis. There is mild concentric left ventricular hypertrophy.  Left ventricular diastolic  function could not be evaluated. Elevated left ventricular end-diastolic  pressure.   2. Right ventricular systolic function is normal. The right ventricular  size is normal.   3. Left atrial size was mildly dilated.   4. The mitral valve is degenerative. Trivial mitral valve regurgitation.  Mild to moderate mitral stenosis. The mean mitral valve gradient is 3.0  mmHg. Severe  mitral annular calcification.   5. The aortic valve is calcified. Aortic valve regurgitation is mild.  Mild to moderate aortic valve stenosis. Aortic valve area, by VTI measures  0.97 cm. Aortic valve mean gradient measures 10.0 mmHg. Aortic valve Vmax  measures 2.16 m/s.   6. The inferior vena cava is normal in size with greater than 50%  respiratory variability, suggesting right atrial pressure of 3 mmHg.   7. Recommend definity contrast study. The endocardial borders are poorly  defined in some views and cannot accurately assess EF. Gross estimate of  EF is around 50% which is improved from prior study, but again, images are  not optimal. Ther AV is not well   visualized. The DI and low SVI support some degree of low flow low  gradient AS. Could consider TEE.   Assessment & Plan   1.  HFpEF-euvolemic today.  Weight stable.  Underwent BiV PPM upgrade.  Has returned to his normal daily activities.  Follow-up echocardiogram 06/09/2021 showed LVEF 50-55%, mild LVH.  Details above. Continue furosemide, metoprolol, potassium  Heart healthy low-sodium diet-salty 6 given Increase physical activity as tolerated  Essential hypertension-BP today 116/70.  Not a candidate for ARB/ACE/Arni due to renal function. Continue metoprolol Heart healthy low-sodium diet-salty 6 given Increase physical activity as tolerated   Second-degree heart block status post PPM-underwent PPM BiV upgrade 08/14/2020.  Last interrogation showed good function, no impedance, a sensed, V paced, Follows with EP   Carotid artery stenosis/chronic left subclavian occlusion-status post left CEA. Continue aspirin, co-Q10 Follows with VS   Hyperlipidemia-03/18/2021: Cholesterol 161; HDL 39.30; LDL Cholesterol 96; Triglycerides 129.0; VLDL 25.8 Continue rosuvastatin Heart healthy low-sodium high-fiber diet Increase physical activity as tolerated   Disposition: Follow-up with Dr. Sallyanne Kuster or me in 6 months.  Jossie Ng.  Ryliee Figge NP-C    07/08/2021, 10:31 AM [facility] [address] Office [phone] Fax [phone]  Notice: This dictation was prepared with Dragon dictation along with smaller phrase technology. Any transcriptional errors that result from this process are unintentional and may not be corrected upon review.  I spent 13 minutes examining this patient, reviewing medications, and using patient centered shared decision making involving her cardiac care.  Prior to her visit I spent greater than 20 minutes reviewing her past medical history,  medications, and prior cardiac tests.

## 2021-07-07 DIAGNOSIS — R338 Other retention of urine: Secondary | ICD-10-CM | POA: Diagnosis not present

## 2021-07-07 DIAGNOSIS — N401 Enlarged prostate with lower urinary tract symptoms: Secondary | ICD-10-CM | POA: Diagnosis not present

## 2021-07-08 ENCOUNTER — Other Ambulatory Visit: Payer: Self-pay

## 2021-07-08 ENCOUNTER — Encounter: Payer: Self-pay | Admitting: General Practice

## 2021-07-08 ENCOUNTER — Ambulatory Visit: Payer: Medicare Other | Admitting: General Practice

## 2021-07-08 VITALS — BP 116/70 | HR 76 | Ht 73.0 in | Wt 167.0 lb

## 2021-07-08 DIAGNOSIS — E782 Mixed hyperlipidemia: Secondary | ICD-10-CM

## 2021-07-08 DIAGNOSIS — I6529 Occlusion and stenosis of unspecified carotid artery: Secondary | ICD-10-CM

## 2021-07-08 DIAGNOSIS — I10 Essential (primary) hypertension: Secondary | ICD-10-CM | POA: Diagnosis not present

## 2021-07-08 DIAGNOSIS — I5032 Chronic diastolic (congestive) heart failure: Secondary | ICD-10-CM

## 2021-07-08 DIAGNOSIS — I441 Atrioventricular block, second degree: Secondary | ICD-10-CM | POA: Diagnosis not present

## 2021-07-08 NOTE — Patient Instructions (Signed)
Medication Instructions:  The current medical regimen is effective;  continue present plan and medications as directed. Please refer to the Current Medication list given to you today.   *If you need a refill on your cardiac medications before your next appointment, please call your pharmacy*  Lab Work:   Testing/Procedures:  NONE    NONE  Special Instructions START WALKING 15 MINUTES A DAY  Follow-Up: Your next appointment:  6 month(s) In Person with Sanda Klein, MD   Please call our office 2 months in advance to schedule this appointment   At [facility], you and your health needs are our priority.  As part of our continuing mission to provide you with exceptional heart care, we have created designated Provider Care Teams.  These Care Teams include your primary Cardiologist (physician) and Advanced Practice Providers (APPs -  Physician Assistants and Nurse Practitioners) who all work together to provide you with the care you need, when you need it.

## 2021-08-13 ENCOUNTER — Ambulatory Visit (INDEPENDENT_AMBULATORY_CARE_PROVIDER_SITE_OTHER): Payer: Medicare Other

## 2021-08-13 DIAGNOSIS — I442 Atrioventricular block, complete: Secondary | ICD-10-CM | POA: Diagnosis not present

## 2021-08-13 LAB — CUP PACEART REMOTE DEVICE CHECK
Battery Remaining Longevity: 115 mo
Battery Voltage: 3.03 V
Brady Statistic AP VP Percent: 9.91 %
Brady Statistic AP VS Percent: 0.01 %
Brady Statistic AS VP Percent: 89.94 %
Brady Statistic AS VS Percent: 0.14 %
Brady Statistic RA Percent Paced: 9.91 %
Brady Statistic RV Percent Paced: 99.84 %
Date Time Interrogation Session: 20230317113057
Implantable Lead Implant Date: 20150212
Implantable Lead Implant Date: 20150212
Implantable Lead Implant Date: 20220318
Implantable Lead Location: 753858
Implantable Lead Location: 753859
Implantable Lead Location: 753860
Implantable Lead Model: 4598
Implantable Lead Model: 5076
Implantable Lead Model: 5076
Implantable Pulse Generator Implant Date: 20220318
Lead Channel Impedance Value: 285 Ohm
Lead Channel Impedance Value: 342 Ohm
Lead Channel Impedance Value: 361 Ohm
Lead Channel Impedance Value: 380 Ohm
Lead Channel Impedance Value: 399 Ohm
Lead Channel Impedance Value: 418 Ohm
Lead Channel Impedance Value: 437 Ohm
Lead Channel Impedance Value: 551 Ohm
Lead Channel Impedance Value: 608 Ohm
Lead Channel Impedance Value: 665 Ohm
Lead Channel Impedance Value: 684 Ohm
Lead Channel Impedance Value: 874 Ohm
Lead Channel Impedance Value: 912 Ohm
Lead Channel Impedance Value: 931 Ohm
Lead Channel Pacing Threshold Amplitude: 0.625 V
Lead Channel Pacing Threshold Amplitude: 1 V
Lead Channel Pacing Threshold Amplitude: 1.125 V
Lead Channel Pacing Threshold Pulse Width: 0.4 ms
Lead Channel Pacing Threshold Pulse Width: 0.4 ms
Lead Channel Pacing Threshold Pulse Width: 0.5 ms
Lead Channel Sensing Intrinsic Amplitude: 17.25 mV
Lead Channel Sensing Intrinsic Amplitude: 17.25 mV
Lead Channel Sensing Intrinsic Amplitude: 4.5 mV
Lead Channel Sensing Intrinsic Amplitude: 4.5 mV
Lead Channel Setting Pacing Amplitude: 1.5 V
Lead Channel Setting Pacing Amplitude: 1.5 V
Lead Channel Setting Pacing Amplitude: 2.25 V
Lead Channel Setting Pacing Pulse Width: 0.4 ms
Lead Channel Setting Pacing Pulse Width: 0.5 ms
Lead Channel Setting Sensing Sensitivity: 0.9 mV

## 2021-08-20 NOTE — Progress Notes (Signed)
Remote pacemaker transmission.   

## 2021-08-23 ENCOUNTER — Other Ambulatory Visit: Payer: Self-pay | Admitting: Internal Medicine

## 2021-09-21 ENCOUNTER — Encounter: Payer: Self-pay | Admitting: Internal Medicine

## 2021-09-21 ENCOUNTER — Ambulatory Visit: Payer: Medicare Other | Admitting: Internal Medicine

## 2021-09-21 VITALS — BP 112/70 | HR 70 | Temp 97.8°F | Ht 73.0 in | Wt 172.0 lb

## 2021-09-21 DIAGNOSIS — R739 Hyperglycemia, unspecified: Secondary | ICD-10-CM

## 2021-09-21 DIAGNOSIS — J449 Chronic obstructive pulmonary disease, unspecified: Secondary | ICD-10-CM | POA: Diagnosis not present

## 2021-09-21 DIAGNOSIS — N1832 Chronic kidney disease, stage 3b: Secondary | ICD-10-CM

## 2021-09-21 DIAGNOSIS — E538 Deficiency of other specified B group vitamins: Secondary | ICD-10-CM

## 2021-09-21 DIAGNOSIS — I10 Essential (primary) hypertension: Secondary | ICD-10-CM

## 2021-09-21 DIAGNOSIS — E782 Mixed hyperlipidemia: Secondary | ICD-10-CM | POA: Diagnosis not present

## 2021-09-21 DIAGNOSIS — E1165 Type 2 diabetes mellitus with hyperglycemia: Secondary | ICD-10-CM | POA: Diagnosis not present

## 2021-09-21 DIAGNOSIS — E559 Vitamin D deficiency, unspecified: Secondary | ICD-10-CM | POA: Diagnosis not present

## 2021-09-21 LAB — URINALYSIS, ROUTINE W REFLEX MICROSCOPIC
Bilirubin Urine: NEGATIVE
Hgb urine dipstick: NEGATIVE
Ketones, ur: NEGATIVE
Leukocytes,Ua: NEGATIVE
Nitrite: NEGATIVE
RBC / HPF: NONE SEEN (ref 0–?)
Specific Gravity, Urine: 1.01 (ref 1.000–1.030)
Total Protein, Urine: NEGATIVE
Urine Glucose: NEGATIVE
Urobilinogen, UA: 0.2 (ref 0.0–1.0)
WBC, UA: NONE SEEN (ref 0–?)
pH: 5.5 (ref 5.0–8.0)

## 2021-09-21 LAB — LIPID PANEL
Cholesterol: 140 mg/dL (ref 0–200)
HDL: 35.7 mg/dL — ABNORMAL LOW (ref >39.00)
LDL Cholesterol: 82 mg/dL (ref 0–99)
NonHDL: 104.36
Total CHOL/HDL Ratio: 4
Triglycerides: 114 mg/dL (ref 0.0–149.0)
VLDL: 22.8 mg/dL (ref 0.0–40.0)

## 2021-09-21 LAB — HEPATIC FUNCTION PANEL
ALT: 19 U/L (ref 0–53)
AST: 23 U/L (ref 0–37)
Albumin: 4.4 g/dL (ref 3.5–5.2)
Alkaline Phosphatase: 86 U/L (ref 39–117)
Bilirubin, Direct: 0.2 mg/dL (ref 0.0–0.3)
Total Bilirubin: 1.3 mg/dL — ABNORMAL HIGH (ref 0.2–1.2)
Total Protein: 7.7 g/dL (ref 6.0–8.3)

## 2021-09-21 LAB — BASIC METABOLIC PANEL
BUN: 36 mg/dL — ABNORMAL HIGH (ref 6–23)
CO2: 28 mEq/L (ref 19–32)
Calcium: 9.2 mg/dL (ref 8.4–10.5)
Chloride: 101 mEq/L (ref 96–112)
Creatinine, Ser: 1.76 mg/dL — ABNORMAL HIGH (ref 0.40–1.50)
GFR: 33.84 mL/min — ABNORMAL LOW (ref >60.00)
Glucose, Bld: 111 mg/dL — ABNORMAL HIGH (ref 70–99)
Potassium: 4.5 mEq/L (ref 3.5–5.1)
Sodium: 136 mEq/L (ref 135–145)

## 2021-09-21 LAB — VITAMIN B12: Vitamin B-12: 295 pg/mL (ref 211–911)

## 2021-09-21 LAB — VITAMIN D 25 HYDROXY (VIT D DEFICIENCY, FRACTURES): VITD: 70.77 ng/mL (ref 30.00–100.00)

## 2021-09-21 LAB — HEMOGLOBIN A1C: Hgb A1c MFr Bld: 6.5 % (ref 4.6–6.5)

## 2021-09-21 NOTE — Patient Instructions (Signed)

## 2021-09-21 NOTE — Progress Notes (Signed)
Patient ID: Daniel Woodard, male   DOB: July 08, 1929, 88 y.o.   MRN: [id number] ? ? ? ?    Chief Complaint: follow up HTN, HLD and hyperglycemia, ckd ? ?     HPI:  Daniel Woodard is a 88 y.o. male here overall doing ok, Pt denies chest pain, increased sob or doe, wheezing, orthopnea, PND, increased LE swelling, palpitations, dizziness or syncope.   Pt denies polydipsia, polyuria, or new focal neuro s/s.    Pt denies fever, wt loss, night sweats, loss of appetite, or other constitutional symptoms  Appetite better, gained some wt.   ?Wt Readings from Last 3 Encounters:  ?09/21/21 172 lb (78 kg)  ?07/08/21 167 lb (75.8 kg)  ?06/22/21 157 lb (71.2 kg)  ? ?BP Readings from Last 3 Encounters:  ?09/21/21 112/70  ?07/08/21 116/70  ?06/22/21 (!) 142/70  ? ?      ?Past Medical History:  ?Diagnosis Date  ? ALLERGIC RHINITIS 04/09/2007  ? Atrial fibrillation ([place])   ? BACTEREMIA, 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  ? BENIGN PROSTATIC HYPERTROPHY 04/09/2007  ? Blood transfusion without reported diagnosis   ? really not sure thinks 30-40 years ago  ? BRADYCARDIA 05/11/2009  ? Carotid artery occlusion   ? COLONIC POLYPS, HX OF 04/09/2007  ? ADENOMATOUS POLYPS 8299,3716  ? Complete heart block ([place])   ? COPD 04/09/2007  ? Cough 03/27/2008  ? DEPRESSION 04/09/2007  ? DIABETES MELLITUS, TYPE II 10/09/2007  ? DIVERTICULOSIS, COLON 04/09/2007  ? DIZZINESS 10/06/2008  ? GLUCOSE INTOLERANCE 04/09/2007  ? HYPERLIPIDEMIA 03/27/2007  ? HYPERTENSION 03/27/2007  ? Kidney stone   ? PERIPHERAL VASCULAR DISEASE 03/27/2007  ? PNEUMONIA ORGANISM NOS 11/08/2007  ? SHINGLES, HX OF 04/09/2007  ? SHOULDER PAIN, BILATERAL 08/01/2007  ? Ulcer   ? 40 years ago  ? ?Past Surgical History:  ?Procedure Laterality Date  ? APPENDECTOMY    ? 88 years old  ? BIV UPGRADE N/A 08/14/2020  ? Procedure: BIV PPM UPGRADE;  Surgeon: Evans Lance, MD;  Location: [facility];  Service: Cardiovascular;  Laterality: N/A;  ? CAROTID ENDARTERECTOMY  5/08  ? left  ?  CARPAL TUNNEL RELEASE    ? bilateral  ? COLONOSCOPY    ? ESOPHAGOGASTRODUODENOSCOPY  06/07/2004  ? HEMORRHOID BANDING  1990s  ? INCISION AND DRAINAGE ABSCESS N/A 06/08/2021  ? Procedure: INCISION AND DRAINAGE ABSCESS;  Surgeon: Kinsinger, Arta Bruce, MD;  Location: [facility];  Service: General;  Laterality: N/A;  ? LEFT HEART CATH AND CORONARY ANGIOGRAPHY N/A 06/02/2020  ? Procedure: LEFT HEART CATH AND CORONARY ANGIOGRAPHY;  Surgeon: Jettie Booze, MD;  Location: [facility];  Service: Cardiovascular;  Laterality: N/A;  ? PACEMAKER INSERTION  07/11/2013  ? MDT ADDRL1 pacemaker implanted by Dr Lovena Le for complete heart block  ? PERMANENT PACEMAKER INSERTION N/A 07/11/2013  ? Procedure: PERMANENT PACEMAKER INSERTION;  Surgeon: Evans Lance, MD;  Location: [facility];  Service: Cardiovascular;  Laterality: N/A;  ? s/p PUD surgury    ? ? partial gastrectomy  ? ? reports that he quit smoking about 22 years ago. His smoking use included cigarettes. He has a 50.00 pack-year smoking history. He has never used smokeless tobacco. He reports that he does not drink alcohol and does not use drugs. ?family history includes Heart disease in his sister and another family member; Hypertension in his father, mother, sister, sister, sister, and sister; Lung cancer in his sister; Peripheral vascular disease in his  brother; Stroke in an other family member; Ulcers in his father and sister. ?Allergies  ?Allergen Reactions  ? Ace Inhibitors Cough  ?   ?  ? ?Current Outpatient Medications on File Prior to Visit  ?Medication Sig Dispense Refill  ? ASPIRIN 81 PO Take 81 mg by mouth daily.    ? furosemide (LASIX) 40 MG tablet Take 1 tablet by mouth once daily 90 tablet 0  ? Magnesium 400 MG TABS Take 400 mg by mouth daily.    ? Metoprolol Succinate 25 MG CS24 Take 25 mg by mouth daily. Take 1/2 tablet by mouth daily    ? omeprazole (PRILOSEC) 20 MG capsule Take 1 capsule by mouth once daily (Patient taking differently: 20 mg daily.)  90 capsule 3  ? potassium chloride SA (KLOR-CON M) 20 MEQ tablet Take 1 tablet by mouth once daily 90 tablet 0  ? psyllium (METAMUCIL) 58.6 % packet Take 1 packet by mouth as needed.    ? QUERCETIN PO Take 3 tablets by mouth 2 (two) times daily. With Vitamin C    ? rosuvastatin (CRESTOR) 20 MG tablet Take 1 tablet (20 mg total) by mouth at bedtime. 30 tablet 0  ? VITAMIN D PO Take 5,000 Units by mouth daily.    ? zinc gluconate 50 MG tablet Take 50 mg by mouth 2 (two) times daily.    ? ?No current facility-administered medications on file prior to visit.  ? ?     ROS:  All others reviewed and negative. ? ?Objective  ? ?     PE:  BP 112/70 (BP Location: Right Arm, Patient Position: Sitting, Cuff Size: Large)   Pulse 70   Temp 97.8 ?F (36.6 ?C) (Oral)   Ht _0  (1.854 m)   Wt 172 lb (78 kg)   SpO2 98%   BMI 22.69 kg/m?  ? ?              Constitutional: Pt appears in NAD ?              HENT: Head: NCAT.  ?              Right Ear: External ear normal.   ?              Left Ear: External ear normal.  ?              Eyes: . Pupils are equal, round, and reactive to light. Conjunctivae and EOM are normal ?              Nose: without d/c or deformity ?              Neck: Neck supple. Gross normal ROM ?              Cardiovascular: Normal rate and regular rhythm.   ?              Pulmonary/Chest: Effort normal and breath sounds without rales or wheezing.  ?              Abd:  Soft, NT, ND, + BS, no organomegaly ?              Neurological: Pt is alert. At baseline orientation, motor grossly intact ?              Skin: Skin is warm. No rashes, no other new lesions, LE edema - none ?  Psychiatric: Pt behavior is normal without agitation  ? ?Micro: none ? ?Cardiac tracings I have personally interpreted today:  none ? ?Pertinent Radiological findings (summarize): none  ? ?Lab Results  ?Component Value Date  ? WBC 10.8 (H) 06/09/2021  ? HGB 12.3 (L) 06/09/2021  ? HCT 38.2 (L) 06/09/2021  ? PLT 235 06/09/2021  ?  GLUCOSE 111 (H) 09/21/2021  ? CHOL 140 09/21/2021  ? TRIG 114.0 09/21/2021  ? HDL 35.70 (L) 09/21/2021  ? LDLDIRECT 189.9 10/02/2007  ? [place] 82 09/21/2021  ? ALT 19 09/21/2021  ? AST 23 09/21/2021  ? NA 136 09/21/2021  ? K 4.5 09/21/2021  ? CL 101 09/21/2021  ? CREATININE 1.76 (H) 09/21/2021  ? BUN 36 (H) 09/21/2021  ? CO2 28 09/21/2021  ? TSH 2.473 06/08/2021  ? PSA 0.92 11/20/2019  ? INR 1.1 03/06/2019  ? HGBA1C 6.5 09/21/2021  ? MICROALBUR 10.1 (H) 03/18/2021  ? ?Assessment/Plan:  ?ZAKERY NORMINGTON is a 88 y.o. White or Caucasian [1] male with  has a past medical history of ALLERGIC RHINITIS (04/09/2007), Atrial fibrillation ([place]), BACTEREMIA, MYCOBACTERIUM AVIUM COMPLEX (04/09/2007), BENIGN PROSTATIC HYPERTROPHY (04/09/2007), Blood transfusion without reported diagnosis, BRADYCARDIA (05/11/2009), Carotid artery occlusion, COLONIC POLYPS, HX OF (04/09/2007), Complete heart block ([place]), COPD (04/09/2007), Cough (03/27/2008), DEPRESSION (04/09/2007), DIABETES MELLITUS, TYPE II (10/09/2007), DIVERTICULOSIS, COLON (04/09/2007), DIZZINESS (10/06/2008), GLUCOSE INTOLERANCE (04/09/2007), HYPERLIPIDEMIA (03/27/2007), HYPERTENSION (03/27/2007), Kidney stone, PERIPHERAL VASCULAR DISEASE (03/27/2007), PNEUMONIA ORGANISM NOS (11/08/2007), SHINGLES, HX OF (04/09/2007), SHOULDER PAIN, BILATERAL (08/01/2007), and Ulcer. ? ?Diabetes ([place]) ?Lab Results  ?Component Value Date  ? HGBA1C 6.5 09/21/2021  ? ?Stable, pt to continue current medical treatment  - diet ? ? ?Hyperlipidemia ?Lab Results  ?Component Value Date  ? [place] 82 09/21/2021  ? ?Mild uncontrolled, goal ldl < 70, pt to continue crestor 20, declines change, for lower chol diet ? ? ?Essential hypertension ?BP Readings from Last 3 Encounters:  ?09/21/21 112/70  ?07/08/21 116/70  ?06/22/21 (!) 142/70  ? ?Stable, pt to continue medical treatment metoprolol ? ? ?CKD (chronic kidney disease), stage III ([place]) ?Lab Results  ?Component Value Date  ? CREATININE 1.76 (H)  09/21/2021  ? ?Stable overall, cont to avoid nephrotoxins ? ? ?COPD GOLD II / AB component  ?Overall stable, cont inhaler prn ? ?Vitamin D deficiency ?Last vitamin D ?Lab Results  ?Component Value Date  ? [place]

## 2021-09-25 ENCOUNTER — Encounter: Payer: Self-pay | Admitting: Internal Medicine

## 2021-09-25 DIAGNOSIS — E559 Vitamin D deficiency, unspecified: Secondary | ICD-10-CM | POA: Insufficient documentation

## 2021-09-25 NOTE — Assessment & Plan Note (Signed)
Overall stable, cont inhaler prn 

## 2021-09-25 NOTE — Assessment & Plan Note (Signed)
Lab Results  ?Component Value Date  ? [place] 82 09/21/2021  ? ?Mild uncontrolled, goal ldl < 70, pt to continue crestor 20, declines change, for lower chol diet ? ?

## 2021-09-25 NOTE — Assessment & Plan Note (Signed)
Lab Results  ?Component Value Date  ? CREATININE 1.76 (H) 09/21/2021  ? ?Stable overall, cont to avoid nephrotoxins ? ?

## 2021-09-25 NOTE — Assessment & Plan Note (Signed)
Last vitamin D Lab Results  Component Value Date   VD25OH 70.77 09/21/2021   Stable, cont oral replacement  

## 2021-09-25 NOTE — Assessment & Plan Note (Signed)
Lab Results  ?Component Value Date  ? HGBA1C 6.5 09/21/2021  ? ?Stable, pt to continue current medical treatment  - diet ? ?

## 2021-09-25 NOTE — Assessment & Plan Note (Signed)
BP Readings from Last 3 Encounters:  ?09/21/21 112/70  ?07/08/21 116/70  ?06/22/21 (!) 142/70  ? ?Stable, pt to continue medical treatment metoprolol ? ?

## 2021-11-12 ENCOUNTER — Ambulatory Visit (INDEPENDENT_AMBULATORY_CARE_PROVIDER_SITE_OTHER): Payer: Medicare Other

## 2021-11-12 DIAGNOSIS — I442 Atrioventricular block, complete: Secondary | ICD-10-CM | POA: Diagnosis not present

## 2021-11-13 LAB — CUP PACEART REMOTE DEVICE CHECK
Battery Remaining Longevity: 120 mo
Battery Voltage: 3.02 V
Brady Statistic AP VP Percent: 13.07 %
Brady Statistic AP VS Percent: 0.01 %
Brady Statistic AS VP Percent: 86.69 %
Brady Statistic AS VS Percent: 0.24 %
Brady Statistic RA Percent Paced: 13.15 %
Brady Statistic RV Percent Paced: 99.75 %
Date Time Interrogation Session: 20230615225810
Implantable Lead Implant Date: 20150212
Implantable Lead Implant Date: 20150212
Implantable Lead Implant Date: 20220318
Implantable Lead Location: 753858
Implantable Lead Location: 753859
Implantable Lead Location: 753860
Implantable Lead Model: 4598
Implantable Lead Model: 5076
Implantable Lead Model: 5076
Implantable Pulse Generator Implant Date: 20220318
Lead Channel Impedance Value: 304 Ohm
Lead Channel Impedance Value: 342 Ohm
Lead Channel Impedance Value: 361 Ohm
Lead Channel Impedance Value: 399 Ohm
Lead Channel Impedance Value: 399 Ohm
Lead Channel Impedance Value: 418 Ohm
Lead Channel Impedance Value: 437 Ohm
Lead Channel Impedance Value: 532 Ohm
Lead Channel Impedance Value: 589 Ohm
Lead Channel Impedance Value: 665 Ohm
Lead Channel Impedance Value: 703 Ohm
Lead Channel Impedance Value: 874 Ohm
Lead Channel Impedance Value: 893 Ohm
Lead Channel Impedance Value: 912 Ohm
Lead Channel Pacing Threshold Amplitude: 0.625 V
Lead Channel Pacing Threshold Amplitude: 1 V
Lead Channel Pacing Threshold Amplitude: 1 V
Lead Channel Pacing Threshold Pulse Width: 0.4 ms
Lead Channel Pacing Threshold Pulse Width: 0.4 ms
Lead Channel Pacing Threshold Pulse Width: 0.5 ms
Lead Channel Sensing Intrinsic Amplitude: 17.25 mV
Lead Channel Sensing Intrinsic Amplitude: 17.25 mV
Lead Channel Sensing Intrinsic Amplitude: 4.5 mV
Lead Channel Sensing Intrinsic Amplitude: 4.5 mV
Lead Channel Setting Pacing Amplitude: 1.5 V
Lead Channel Setting Pacing Amplitude: 1.5 V
Lead Channel Setting Pacing Amplitude: 2 V
Lead Channel Setting Pacing Pulse Width: 0.4 ms
Lead Channel Setting Pacing Pulse Width: 0.5 ms
Lead Channel Setting Sensing Sensitivity: 0.9 mV

## 2021-11-18 NOTE — Progress Notes (Signed)
Remote pacemaker transmission.   

## 2021-11-19 ENCOUNTER — Other Ambulatory Visit: Payer: Self-pay | Admitting: Internal Medicine

## 2021-11-22 ENCOUNTER — Encounter: Payer: Self-pay | Admitting: Internal Medicine

## 2021-11-22 ENCOUNTER — Ambulatory Visit (INDEPENDENT_AMBULATORY_CARE_PROVIDER_SITE_OTHER): Payer: Medicare Other | Admitting: Internal Medicine

## 2021-11-22 VITALS — BP 144/80 | HR 88 | Ht 73.0 in | Wt 168.6 lb

## 2021-11-22 DIAGNOSIS — I10 Essential (primary) hypertension: Secondary | ICD-10-CM

## 2021-11-22 DIAGNOSIS — I5042 Chronic combined systolic (congestive) and diastolic (congestive) heart failure: Secondary | ICD-10-CM

## 2021-11-25 ENCOUNTER — Other Ambulatory Visit: Payer: Self-pay | Admitting: Internal Medicine

## 2021-11-25 ENCOUNTER — Other Ambulatory Visit: Payer: Self-pay | Admitting: *Deleted

## 2021-11-25 MED ORDER — POTASSIUM CHLORIDE CRYS ER 20 MEQ PO TBCR: 20.0000 meq | EXTENDED_RELEASE_TABLET | Freq: Every day | ORAL | 3 refills | Status: DC

## 2021-11-29 ENCOUNTER — Other Ambulatory Visit: Payer: Self-pay | Admitting: Internal Medicine

## 2021-11-29 NOTE — Telephone Encounter (Signed)
Please refill as per office routine med refill policy (all routine meds to be refilled for 3 mo or monthly (per pt preference) up to one year from last visit, then month to month grace period for 3 mo, then further med refills will have to be denied) ? ?

## 2021-12-01 ENCOUNTER — Other Ambulatory Visit (HOSPITAL_COMMUNITY): Payer: Self-pay

## 2021-12-01 MED ORDER — METOPROLOL SUCCINATE 25 MG PO CS24
25.0000 mg | EXTENDED_RELEASE_CAPSULE | Freq: Every day | ORAL | 1 refills | Status: DC
  Filled 2021-12-01: qty 90, fill #0

## 2021-12-01 MED ORDER — METOPROLOL SUCCINATE ER 25 MG PO TB24
25.0000 mg | ORAL_TABLET | Freq: Every day | ORAL | 1 refills | Status: DC
  Filled 2021-12-01: qty 90, 90d supply, fill #0

## 2021-12-06 ENCOUNTER — Other Ambulatory Visit (HOSPITAL_COMMUNITY): Payer: Self-pay

## 2021-12-08 ENCOUNTER — Other Ambulatory Visit (HOSPITAL_COMMUNITY): Payer: Self-pay

## 2021-12-27 ENCOUNTER — Other Ambulatory Visit: Payer: Self-pay | Admitting: Internal Medicine

## 2022-01-13 DIAGNOSIS — L821 Other seborrheic keratosis: Secondary | ICD-10-CM | POA: Diagnosis not present

## 2022-01-13 DIAGNOSIS — L814 Other melanin hyperpigmentation: Secondary | ICD-10-CM | POA: Diagnosis not present

## 2022-01-13 DIAGNOSIS — D485 Neoplasm of uncertain behavior of skin: Secondary | ICD-10-CM | POA: Diagnosis not present

## 2022-01-13 DIAGNOSIS — L578 Other skin changes due to chronic exposure to nonionizing radiation: Secondary | ICD-10-CM | POA: Diagnosis not present

## 2022-01-14 ENCOUNTER — Ambulatory Visit: Payer: Medicare Other | Admitting: Internal Medicine

## 2022-01-17 ENCOUNTER — Encounter: Payer: Self-pay | Admitting: Internal Medicine

## 2022-01-17 ENCOUNTER — Ambulatory Visit: Payer: Medicare Other | Admitting: Internal Medicine

## 2022-01-17 DIAGNOSIS — J449 Chronic obstructive pulmonary disease, unspecified: Secondary | ICD-10-CM | POA: Diagnosis not present

## 2022-01-17 DIAGNOSIS — J84112 Idiopathic pulmonary fibrosis: Secondary | ICD-10-CM

## 2022-01-17 NOTE — Progress Notes (Signed)
Daniel Woodard, male    DOB: 26-Apr-1930    MRN: [id number]   Brief patient profile:  28  yowm MM/quit smoking 2001 self referred 08/11/14 to pulmonary clinic with GOLD II/ copd documented 11/28/2014 @ MMRC=1 and lost to f/u on no  maint resp rx  Then early fall 2020 gradually worse sob        History of Present Illness  07/05/2019  Pulmonary/ 1st office eval/Daniel Woodard  Chief Complaint  Patient presents with   Consult    Patient is having shortness of breath with exertion and daily activites that started 6 months ago. Patient has a productive cough with grey sputum.   Dyspnea:  500 ft  To mailbox and has to sit on patio to get his breath = MMRC2 = can't walk a nl pace on a flat grade s sob but does fine slow and flat  Cough: varies mostly daytime / rattle min mucoid to brown occ in am  Sleep:  30-40 degrees on pillows   SABA use: none  rec Breztri Take 2 puffs first thing in am and then another 2 puffs about 12 hours later.  Work on inhaler technique:        07/17/19  HRCT c/w prob UIP      01/02/2020  f/u ov/Daniel Woodard re: GOLD II/ AB component  Chief Complaint  Patient presents with   Follow-up    PFT done today.  Breathing is overall doing well. He c/o rhinitis.   Dyspnea:  Walking at [employer]/ walks driveway  Cough: rattles worse in am > white  Sleeping: ok resp wise  SABA use: none  02: none  rec Start  dulera 100 (or Breztri) take 2 puffs first thing in am and then another 2 puffs about 12 hours later.  Work on inhaler technique:  Please schedule a follow up visit in 3 months but call sooner if needed  - bring inhaler with you and your drug formulary (your list of cheaper alternative)    07/16/2020  f/u ov/Daniel Woodard re:  GOLD II/ AB component no better on dulera or breztri  No chief complaint on file.  Dyspnea:  Still walking sams  Cough: none  Sleeping: ok almost flat  SABA use: avg once daily  02: none  Covid status:  vax x 3   Rec Only use your albuterol as a rescue medication    Also ok to Try albuterol 15 min before an activity that you know would make you short of breath    Make sure you check your oxygen saturation at your highest level of activity to be sure it stays over 90%   Please schedule a follow up visit in 6 months but call sooner if needed  with all medications     01/14/2021  f/u ov/Daniel Woodard re: COPD II/ AB component  and prob UIP   maint on no meds   Chief Complaint  Patient presents with   Follow-up    Doing well, some SOB with exertion, passed kidney stone few weeks ago   Dyspnea:  still walking sams and [employer] leaning cart but not checking sats / overall very sedentary  Cough: none  Sleeping: bed is < 30 degrees/ sleeps on side SABA use: none 02: none Covid status:   vax x 3  Rec Make sure you check your oxygen saturation at your highest level of activity to be sure it stays over 90%       01/17/2022  f/u ov/Daniel Woodard re:  COPD 2/ AB  maint on no inhalers Chief Complaint  Patient presents with   Follow-up  Dyspnea:  no change  in ex tol, not checking sats as rec Cough: none  Sleeping: slt elevation with electric bed / one pillow  SABA use: none  02: none     No obvious day to day or daytime variability or assoc excess/ purulent sputum or mucus plugs or hemoptysis or cp or chest tightness, subjective wheeze or overt sinus or hb symptoms.   Sleeping  without nocturnal  or early am exacerbation  of respiratory  c/o's or need for noct saba. Also denies any obvious fluctuation of symptoms with weather or environmental changes or other aggravating or alleviating factors except as outlined above   No unusual exposure hx or h/o childhood pna/ asthma or knowledge of premature birth.  Current Allergies, Complete Past Medical History, Past Surgical History, Family History, and Social History were reviewed in [employer] record.  ROS  The following are not active complaints unless bolded Hoarseness, sore throat, dysphagia, dental  problems, itching, sneezing,  nasal congestion or discharge of excess mucus or purulent secretions, ear ache,   fever, chills, sweats, unintended wt loss or wt gain, classically pleuritic or exertional cp,  orthopnea pnd or arm/hand swelling  or leg swelling, presyncope, palpitations, abdominal pain, anorexia, nausea, vomiting, diarrhea  or change in bowel habits or change in bladder habits, change in stools or change in urine, dysuria, hematuria,  rash, arthralgias, visual complaints, headache, numbness, weakness or ataxia or problems with walking or coordination,  change in mood or  memory.        Current Meds  Medication Sig   ASPIRIN 81 PO Take 81 mg by mouth daily.   furosemide (LASIX) 40 MG tablet Take 1 tablet (40 mg total) by mouth daily.   Magnesium 400 MG TABS Take 400 mg by mouth daily.   metoprolol succinate (TOPROL-XL) 25 MG 24 hr tablet Take 1 tablet (25 mg total) by mouth daily.   omeprazole (PRILOSEC) 20 MG capsule Take 1 capsule by mouth once daily   potassium chloride SA (KLOR-CON M) 20 MEQ tablet Take 1 tablet (20 mEq total) by mouth daily.   psyllium (METAMUCIL) 58.6 % packet Take 1 packet by mouth as needed.   QUERCETIN PO Take 3 tablets by mouth 2 (two) times daily. With Vitamin C   rosuvastatin (CRESTOR) 20 MG tablet Take 1 tablet (20 mg total) by mouth at bedtime.   VITAMIN D PO Take 5,000 Units by mouth daily.   vitamin k 100 MCG tablet Take 100 mcg by mouth daily.   zinc gluconate 50 MG tablet Take 50 mg by mouth 2 (two) times daily.            Past Medical History:  Diagnosis Date   ALLERGIC RHINITIS 04/09/2007   Atrial fibrillation ([place])    BACTEREMIA, 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   BENIGN PROSTATIC HYPERTROPHY 04/09/2007   Blood transfusion without reported diagnosis    really not sure thinks 30-40 years ago   BRADYCARDIA 05/11/2009   Carotid artery occlusion    COLONIC POLYPS, HX OF 04/09/2007   ADENOMATOUS POLYPS 2004,2008   Complete heart  block ([place])    COPD 04/09/2007   Cough 03/27/2008   DEPRESSION 04/09/2007   DIABETES MELLITUS, TYPE II 10/09/2007   DIVERTICULOSIS, COLON 04/09/2007   DIZZINESS 10/06/2008   GLUCOSE INTOLERANCE 04/09/2007   HYPERLIPIDEMIA 03/27/2007   HYPERTENSION 03/27/2007   PERIPHERAL VASCULAR  DISEASE 03/27/2007   PNEUMONIA ORGANISM NOS 11/08/2007   SHINGLES, HX OF 04/09/2007   SHOULDER PAIN, BILATERAL 08/01/2007   Ulcer    40 years ago       Objective:     Wts   01/17/2022       170 01/14/2021       166  07/16/2020       172 01/02/2020         193   09/27/2019      197   08/16/19 195 lb (88.5 kg)  07/23/19 194 lb (88 kg)  07/05/19 194 lb (88 kg)    Vital signs reviewed  01/17/2022  - Note at rest 02 sats  96% on RA   General appearance:    amb stoic wm/ minimally congested sounding cough        HEENT : Oropharynx  clear/ full dentures      Nasal turbinates nl    NECK :  without  apparent JVD/ palpable Nodes/TM    LUNGS: no acc muscle use,  Nl contour chest with distant insp crackles bases bilaterally without cough on insp or exp maneuvers   CV:  RRR  no s3 or murmur or increase in P2, and no edema   ABD:  soft and nontender with nl inspiratory excursion in the supine position. No bruits or organomegaly appreciated   MS:  Nl gait/ ext warm without deformities Or obvious joint restrictions  calf tenderness, cyanosis    - No clubbing    SKIN: warm and dry without lesions    NEURO:  alert, approp, nl sensorium with  no motor or cerebellar deficits apparent.         Assessment

## 2022-01-17 NOTE — Patient Instructions (Addendum)
Make sure you check your oxygen saturation at your highest level of activity to be sure it stays over 90% and keep track of it at least once a week, more often if breathing getting worse, and let me know if losing ground.  Prilosec should be Take 30-60 min before first meal of the day and add another 30 min before supper or worse cough or sense congestion    Please schedule a follow up visit in 12 months but call sooner if needed

## 2022-01-17 NOTE — Assessment & Plan Note (Signed)
Noted on cxr 07/05/2019 s acute changes clinically  - rec HRCT p levaquin 500 mg daily x 10 days  - HRCT chest 07/17/19 Spectrum of findings compatible with basilar predominant fibroticinterstitial lung disease with mild traction bronchiectasis and no frank honeycombing. Findings are categorized as probable UIP - 08/16/2019   Walked RA x 3 laps =  approx 786f @ nl pace - stopped due to end of study  with sats of 93 % at the end of the study s sob  - 01/02/2020 PFTs  Vs 5 y prior:   FVC down from 3.16 to 2.72 and DLC0 down from 16.48 to 12.51 neither value corrected for hgb  - 01/14/2021   Walked on RA x  2  lap(s) =  approx 500 @ slow/moderate pace, stopped due to end of study s sob with lowest 02 sats 94%     Encouraged again to monitor sats at peak ex and call if decline noted, not interested in PF clinic   F/u yearly, call sooner prn           Each maintenance medication was reviewed in detail including emphasizing most importantly the difference between maintenance and prns and under what circumstances the prns are to be triggered using an action plan format where appropriate.  Total time for H and P, chart review, counseling,  and generating customized AVS unique to this office visit / same day charting = 25 min

## 2022-01-17 NOTE — Assessment & Plan Note (Signed)
Quit smoking 2001 - PFTs 11/28/14  FEV 2.00 (66%) ratio 63 and no change p saba, dlco 47 corrects to 69 for alv vol - 07/05/2019  After extensive coaching inhaler device,  effectiveness =    75% > try Breztri 2bid > no change so did not fill rx  - 07/06/19  Alpha one phenotype MM  Level 132  - PFT's  01/02/2020  FEV1 1.65 (58 % ) ratio 0.55  p 24 % improvement from saba p 0 prior to study with DLCO  12.51 (50%) corrects to 2.46 (65%)  for alv volume and FV curve concave  - 01/02/2020  After extensive coaching inhaler device,  effectiveness =    75% > try dulera 100 2bid > no better so just rx prn saba   Not even using prn saba at this point - may well be that GERD was triggering AB and can control with diet and ppi q am only but with contingency to increase to bid ac reviewed with pt and daughter PAM

## 2022-01-20 DIAGNOSIS — E119 Type 2 diabetes mellitus without complications: Secondary | ICD-10-CM | POA: Diagnosis not present

## 2022-01-20 DIAGNOSIS — H43821 Vitreomacular adhesion, right eye: Secondary | ICD-10-CM | POA: Diagnosis not present

## 2022-01-20 DIAGNOSIS — H35053 Retinal neovascularization, unspecified, bilateral: Secondary | ICD-10-CM | POA: Diagnosis not present

## 2022-01-20 DIAGNOSIS — H3554 Dystrophies primarily involving the retinal pigment epithelium: Secondary | ICD-10-CM | POA: Diagnosis not present

## 2022-01-21 ENCOUNTER — Telehealth: Payer: Self-pay

## 2022-01-21 NOTE — Telephone Encounter (Signed)
Calling to verified if we received a fax updating the dosage of the rosuvastatin (CRESTOR) 20 MG tablet medication.  Reference #[id number]

## 2022-01-27 NOTE — Telephone Encounter (Signed)
I am out of the office so I would not know about receiving a fax

## 2022-01-28 NOTE — Telephone Encounter (Signed)
I do not recall getting the fax but I will keep a look out

## 2022-02-11 ENCOUNTER — Ambulatory Visit (INDEPENDENT_AMBULATORY_CARE_PROVIDER_SITE_OTHER): Payer: Medicare Other

## 2022-02-11 DIAGNOSIS — I441 Atrioventricular block, second degree: Secondary | ICD-10-CM | POA: Diagnosis not present

## 2022-02-11 LAB — CUP PACEART REMOTE DEVICE CHECK
Battery Remaining Longevity: 117 mo
Battery Voltage: 3.01 V
Brady Statistic AP VP Percent: 12.1 %
Brady Statistic AP VS Percent: 0.01 %
Brady Statistic AS VP Percent: 87.79 %
Brady Statistic AS VS Percent: 0.1 %
Brady Statistic RA Percent Paced: 12.11 %
Brady Statistic RV Percent Paced: 99.89 %
Date Time Interrogation Session: 20230914205319
Implantable Lead Implant Date: 20150212
Implantable Lead Implant Date: 20150212
Implantable Lead Implant Date: 20220318
Implantable Lead Location: 753858
Implantable Lead Location: 753859
Implantable Lead Location: 753860
Implantable Lead Model: 4598
Implantable Lead Model: 5076
Implantable Lead Model: 5076
Implantable Pulse Generator Implant Date: 20220318
Lead Channel Impedance Value: 304 Ohm
Lead Channel Impedance Value: 323 Ohm
Lead Channel Impedance Value: 361 Ohm
Lead Channel Impedance Value: 361 Ohm
Lead Channel Impedance Value: 399 Ohm
Lead Channel Impedance Value: 399 Ohm
Lead Channel Impedance Value: 418 Ohm
Lead Channel Impedance Value: 532 Ohm
Lead Channel Impedance Value: 570 Ohm
Lead Channel Impedance Value: 646 Ohm
Lead Channel Impedance Value: 665 Ohm
Lead Channel Impedance Value: 817 Ohm
Lead Channel Impedance Value: 855 Ohm
Lead Channel Impedance Value: 874 Ohm
Lead Channel Pacing Threshold Amplitude: 0.75 V
Lead Channel Pacing Threshold Amplitude: 1 V
Lead Channel Pacing Threshold Amplitude: 1 V
Lead Channel Pacing Threshold Pulse Width: 0.4 ms
Lead Channel Pacing Threshold Pulse Width: 0.4 ms
Lead Channel Pacing Threshold Pulse Width: 0.5 ms
Lead Channel Sensing Intrinsic Amplitude: 13.75 mV
Lead Channel Sensing Intrinsic Amplitude: 13.75 mV
Lead Channel Sensing Intrinsic Amplitude: 4.375 mV
Lead Channel Sensing Intrinsic Amplitude: 4.375 mV
Lead Channel Setting Pacing Amplitude: 1.5 V
Lead Channel Setting Pacing Amplitude: 1.5 V
Lead Channel Setting Pacing Amplitude: 2 V
Lead Channel Setting Pacing Pulse Width: 0.4 ms
Lead Channel Setting Pacing Pulse Width: 0.5 ms
Lead Channel Setting Sensing Sensitivity: 0.9 mV

## 2022-02-22 NOTE — Progress Notes (Signed)
Remote pacemaker transmission.   

## 2022-02-25 ENCOUNTER — Ambulatory Visit (INDEPENDENT_AMBULATORY_CARE_PROVIDER_SITE_OTHER): Payer: Medicare Other

## 2022-02-25 DIAGNOSIS — Z0000 Encounter for general adult medical examination without abnormal findings: Secondary | ICD-10-CM | POA: Diagnosis not present

## 2022-02-25 NOTE — Progress Notes (Signed)
Subjective:   Daniel Woodard is a 88 y.o. male who presents for Medicare Annual/Subsequent preventive examination.  I connected with Daniel Woodard today by telephone and verified that I am speaking with the correct person using two identifiers. I discussed the limitations, risks, security and privacy concerns of performing an evaluation and management service by telephone and the availability of in person appointments. I also discussed with the patient that there may be a patient responsible charge related to this service. The patient expressed understanding and agreed to proceed. Location patient: home Location provider: Pietro Cassis Persons participating in the visit: [place] and Jillene Bucks, [place].  Time Spent with patient on telephone encounter: 12 mins  Review of Systems    No ROS. Medicare Wellness Telephone Visit. Additional risk factors are reflected in social history. Cardiac Risk Factors include: advanced age (>62mn, >>47women);sedentary lifestyle;male gender     Objective:    There were no vitals filed for this visit. There is no height or weight on file to calculate BMI.     02/25/2022    1:16 PM 06/08/2021    6:19 PM 06/07/2021    7:00 PM 06/07/2021    9:57 AM 02/24/2021    5:24 PM 08/14/2020    7:59 AM 06/01/2020    9:28 AM  Advanced Directives  Does Patient Have a Medical Advance Directive? Yes  Yes Yes Yes Yes No  Type of ACorporate treasurerof AWaiohinuLiving will HNorth Want[place] will Healthcare Power of A[place]inof AAlmaLiving will   Does patient want to make changes to medical advance directive? No - Patient declined No - Patient declined    No - Patient declined   Copy of HOn[place]agonin Chart?     Yes - validated most recent copy scanned in chart (See row information) No - copy requested   Would patient like information on creating a medical advance directive?        No - Patient declined    Current Medications (verified) Outpatient Encounter Medications as of 02/25/2022  Medication Sig   ASPIRIN 81 PO Take 81 mg by mouth daily.   furosemide (LASIX) 40 MG tablet Take 1 tablet (40 mg total) by mouth daily.   Magnesium 400 MG TABS Take 400 mg by mouth daily.   metoprolol succinate (TOPROL-XL) 25 MG 24 hr tablet Take 1 tablet (25 mg total) by mouth daily.   omeprazole (PRILOSEC) 20 MG capsule Take 1 capsule by mouth once daily   potassium chloride SA (KLOR-CON M) 20 MEQ tablet Take 1 tablet (20 mEq total) by mouth daily.   psyllium (METAMUCIL) 58.6 % packet Take 1 packet by mouth as needed.   QUERCETIN PO Take 3 tablets by mouth 2 (two) times daily. With Vitamin C   rosuvastatin (CRESTOR) 20 MG tablet Take 1 tablet (20 mg total) by mouth at bedtime.   VITAMIN D PO Take 5,000 Units by mouth daily.   vitamin k 100 MCG tablet Take 100 mcg by mouth daily.   zinc gluconate 50 MG tablet Take 50 mg by mouth 2 (two) times daily.   No facility-administered encounter medications on file as of 02/25/2022.    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   BENIGN PROSTATIC HYPERTROPHY 04/09/2007   Blood transfusion without  reported diagnosis    really not sure thinks 30-40 years ago   BRADYCARDIA 05/11/2009   Carotid artery occlusion    COLONIC POLYPS, HX OF 04/09/2007   ADENOMATOUS POLYPS 2004,2008   Complete heart block (HCC)    COPD 04/09/2007   Cough 03/27/2008   DEPRESSION 04/09/2007   DIABETES MELLITUS, TYPE II 10/09/2007   DIVERTICULOSIS, COLON 04/09/2007   DIZZINESS 10/06/2008   GLUCOSE INTOLERANCE 04/09/2007   HYPERLIPIDEMIA 03/27/2007   HYPERTENSION 03/27/2007   Kidney stone    PERIPHERAL VASCULAR DISEASE 03/27/2007   PNEUMONIA ORGANISM NOS 11/08/2007   SHINGLES, HX OF 04/09/2007    SHOULDER PAIN, BILATERAL 08/01/2007   Ulcer    40 years ago   Past Surgical History:  Procedure Laterality Date   APPENDECTOMY     88 years old   BIV UPGRADE N/A 08/14/2020   Procedure: BIV PPM UPGRADE;  Surgeon: Evans Lance, MD;  Location: [facility];  Service: Cardiovascular;  Laterality: N/A;   CAROTID ENDARTERECTOMY  5/08   left   CARPAL TUNNEL RELEASE     bilateral   COLONOSCOPY     ESOPHAGOGASTRODUODENOSCOPY  06/07/2004   HEMORRHOID BANDING  1990s   INCISION AND DRAINAGE ABSCESS N/A 06/08/2021   Procedure: INCISION AND DRAINAGE ABSCESS;  Surgeon: Kieth Brightly Arta Bruce, MD;  Location: [facility];  Service: General;  Laterality: N/A;   LEFT HEART CATH AND CORONARY ANGIOGRAPHY N/A 06/02/2020   Procedure: LEFT HEART CATH AND CORONARY ANGIOGRAPHY;  Surgeon: Jettie Booze, MD;  Location: [facility];  Service: Cardiovascular;  Laterality: N/A;   PACEMAKER INSERTION  07/11/2013   MDT ADDRL1 pacemaker implanted by Dr Lovena Le for complete heart block   PERMANENT PACEMAKER INSERTION N/A 07/11/2013   Procedure: PERMANENT PACEMAKER INSERTION;  Surgeon: Evans Lance, MD;  Location: [facility];  Service: Cardiovascular;  Laterality: N/A;   s/p PUD surgury     ? partial gastrectomy   Family History  Problem Relation Age of Onset   Hypertension Father    Ulcers Father    Hypertension Mother    Hypertension Sister    Peripheral vascular disease Brother    Hypertension Sister    Ulcers Sister        Ulcers   Hypertension Sister    Lung cancer Sister        smoked   Heart disease Sister        Carotid    Hypertension Sister    Heart disease Other        Cardiovascular disorder, CHF   Stroke Other        1st degree relative male and male   Colon cancer Neg Hx    Social History   Socioeconomic History   Marital status: Widowed    Spouse name: Not on file   Number of children: 2   Years of education: Not on file   Highest education level: Not on file   Occupational History   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  Tobacco Use   Smoking status: Former    Packs/day: 1.00    Years: 50.00    Total pack years: 50.00    Types: Cigarettes    Quit date: 08/20/1999    Years since quitting: 22.5   Smokeless tobacco: Never  Vaping Use   Vaping Use: Never used  Substance and Sexual Activity   Alcohol use: No    Alcohol/week: 0.0 standard drinks of alcohol  Drug use: No   Sexual activity: Never  Other Topics Concern   Not on file  Social History Narrative   Widowed; wife dies 08-11-18   Daily caffeine    Social Determinants of Health   Financial Resource Strain: Low Risk  (02/25/2022)   Overall Financial Resource Strain (CARDIA)    Difficulty of Paying Living Expenses: Not hard at all  Food Insecurity: No Food Insecurity (02/25/2022)   Hunger Vital Sign    Worried About Running Out of Food in the Last Year: Never true    Ran Out of Food in the Last Year: Never true  Transportation Needs: No Transportation Needs (02/25/2022)   PRAPARE - Hydrologist (Medical): No    Lack of Transportation (Non-Medical): No  Physical Activity: Inactive (02/25/2022)   Exercise Vital Sign    Days of Exercise per Week: 0 days    Minutes of Exercise per Session: 0 min  Stress: No Stress Concern Present (02/25/2022)   [place]    Feeling of Stress : Not at all  Social Connections: Unknown (02/25/2022)   Social Connection and Isolation Panel [NHANES]    Frequency of Communication with Friends and Family: More than three times a week    Frequency of Social Gatherings with Friends and Family: More than three times a week    Attends Religious Services: Never    Marine scientist or Organizations: No    Attends Music therapist: Never    Marital Status: Patient refused    Tobacco Counseling Counseling given:  Not Answered   Clinical Intake:  Pre-visit preparation completed: Yes  Pain : No/denies pain     Nutritional Risks: None Diabetes: No  How often do you need to have someone help you when you read instructions, pamphlets, or other written materials from your doctor or pharmacy?: 1 - Never What is the last grade level you completed in school?: 12th grade  Diabetic?no  Interpreter Needed?: No  Information entered by :: Jillene Bucks, CMA   Activities of Daily Living    02/25/2022    1:15 PM 06/08/2021    6:19 PM  In your present state of health, do you have any difficulty performing the following activities:  Hearing? 0 0  Vision? 0 0  Difficulty concentrating or making decisions? 0 1  Comment  daughter states forgetful  Walking or climbing stairs? 0 1  Dressing or bathing? 0 0  Doing errands, shopping? 0   Preparing Food and eating ? N   Using the Toilet? N   In the past six months, have you accidently leaked urine? N   Do you have problems with loss of bowel control? N   Managing your Medications? N   Managing your Finances? N   Housekeeping or managing your Housekeeping? N     Patient Care Team: Biagio Borg, MD as PCP - General Croitoru, Dani Gobble, MD as PCP - Cardiology (Cardiology) Evans Lance, MD as PCP - Electrophysiology (Cardiology) Evans Lance, MD as Consulting Physician (Cardiology) Tanda Rockers, MD as Consulting Physician (Pulmonary Disease) Antionette Fairy Isaias Cowman, MD as Referring Physician (Ophthalmology)  Indicate any recent Medical Services you may have received from other than [facility] providers in the past year (date may be approximate).     Assessment:   This is a routine wellness examination for Daniel Woodard.  Hearing/Vision screen Patient denied any hearing difficulty. No hearing aids.  Patient does wear corrective lenses.  Dietary issues and exercise activities discussed: Current Exercise Habits: The patient does not participate in regular  exercise at present, Exercise limited by: None identified   Goals Addressed             This Visit's Progress    patient stated       He would like to continue to stay as healthy as possible.        Depression Screen    02/25/2022    1:13 PM 09/21/2021   10:44 AM 03/18/2021   11:22 AM 02/24/2021    5:17 PM 09/10/2020   11:05 AM 11/20/2019   10:28 AM 11/20/2019    9:34 AM  PHQ 2/9 Scores  PHQ - 2 Score 0 3 0 0 0 1 0  PHQ- 9 Score  8     0    Fall Risk    02/25/2022    1:14 PM 09/21/2021   10:44 AM 03/18/2021   11:22 AM 02/24/2021    5:17 PM 09/10/2020   11:05 AM  Fall Risk   Falls in the past year? 0 0 0 0 0  Number falls in past yr: 0 0 0 0   Injury with Fall? 0 0 0 0   Risk for fall due to : No Fall Risks      Follow up Falls evaluation completed        FALL RISK PREVENTION PERTAINING TO THE HOME:  Any stairs in or around the home? No  If so, are there any without handrails?  N/A Home free of loose throw rugs in walkways, pet beds, electrical cords, etc? Yes  Adequate lighting in your home to reduce risk of falls? Yes   ASSISTIVE DEVICES UTILIZED TO PREVENT FALLS:  Life alert? No  Use of a cane, walker or w/c? No  Grab bars in the bathroom? Yes  Shower chair or bench in shower? Yes  Elevated toilet seat or a handicapped toilet? Yes   TIMED UP AND GO:  Was the test performed? No .  Length of time to ambulate 10 feet: N/A sec.   Patient stated that he has no issues with gait or balance; does not use any assistive devices.  Cognitive Function:    05/18/2017    2:53 PM  MMSE - Mini Mental State Exam  Orientation to time 5  Orientation to Place 5  Registration 3  Attention/ Calculation 5  Recall 2  Language- name 2 objects 2  Language- repeat 1  Language- follow 3 step command 3  Language- read & follow direction 1  Write a sentence 1  Copy design 1  Total score 29        02/24/2021    5:22 PM  6CIT Screen  What Year? 0 points  What month? 0  points  What time? 0 points  Count back from 20 0 points  Months in reverse 2 points  Repeat phrase 10 points  Total Score 12 points    Immunizations Immunization History  Administered Date(s) Administered   Fluad Quad(high Dose 65+) 02/27/2019   H1N1 05/05/2008   Influenza Split 05/19/2011, 03/06/2012   Influenza Whole 03/30/2000, 03/17/2008, 04/08/2009, 03/17/2010   Influenza, High Dose Seasonal PF 02/03/2015, 02/10/2016, 03/10/2017   Influenza,inj,Quad PF,6+ Mos 03/07/2013, 02/13/2014   PFIZER(Purple Top)SARS-COV-2 Vaccination 06/13/2019, 07/03/2019, 03/03/2020, 09/08/2020, 03/10/2021   Pneumococcal Conjugate-13 05/21/2013   Pneumococcal Polysaccharide-23 03/30/2005, 05/13/2010   Td 07/29/1998, 04/08/2009   Tdap 11/22/2018  Zoster, Live 04/07/2008    TDAP status: Up to date  Flu Vaccine status: Due, Education has been provided regarding the importance of this vaccine. Advised may receive this vaccine at local pharmacy or Health Dept. Aware to provide a copy of the vaccination record if obtained from local pharmacy or Health Dept. Verbalized acceptance and understanding.  Pneumococcal vaccine status: Up to date  Covid-19 vaccine status: Completed vaccines  Qualifies for Shingles Vaccine? Yes   Zostavax completed No   Shingrix Completed?: No.    Education has been provided regarding the importance of this vaccine. Patient has been advised to call insurance company to determine out of pocket expense if they have not yet received this vaccine. Advised may also receive vaccine at local pharmacy or Health Dept. Verbalized acceptance and understanding.  Screening Tests Health Maintenance  Topic Date Due   OPHTHALMOLOGY EXAM  02/25/2022 (Originally 01/13/2022)   COVID-19 Vaccine (6 - Pfizer risk series) 03/13/2022 (Originally 05/05/2021)   INFLUENZA VACCINE  03/30/2022 (Originally 12/28/2021)   Zoster Vaccines- Shingrix (1 of 2) 05/27/2022 (Originally 02/12/1949)   FOOT EXAM   03/18/2022   HEMOGLOBIN A1C  03/23/2022   TETANUS/TDAP  11/21/2028   Pneumonia Vaccine 71+ Years old  Completed   HPV VACCINES  Aged Out    Health Maintenance  There are no preventive care reminders to display for this patient.   Colorectal cancer screening: No longer required.   Lung Cancer Screening: (Low Dose CT Chest recommended if Age 86-80 years, 30 pack-year currently smoking OR have quit w/in 15years.) does not qualify.   Lung Cancer Screening Referral: N/A  Additional Screening:  Hepatitis C Screening: does not qualify; Completed N/A  Vision Screening: Recommended annual ophthalmology exams for early detection of glaucoma and other disorders of the eye. Is the patient up to date with their annual eye exam?  Yes  Who is the provider or what is the name of the office in which the patient attends annual eye exams? [facility] If pt is not established with a provider, would they like to be referred to a provider to establish care? No .   Dental Screening: Recommended annual dental exams for proper oral hygiene  Community Resource Referral / Chronic Care Management: CRR required this visit?  No   CCM required this visit?  No      Plan:     I have personally reviewed and noted the following in the patient's chart:   Medical and social history Use of alcohol, tobacco or illicit drugs  Current medications and supplements including opioid prescriptions. Patient is not currently taking opioid prescriptions. Functional ability and status Nutritional status Physical activity Advanced directives List of other physicians Hospitalizations, surgeries, and ER visits in previous 12 months Vitals Screenings to include cognitive, depression, and falls Referrals and appointments  In addition, I have reviewed and discussed with patient certain preventive protocols, quality metrics, and best practice recommendations. A written personalized care plan for preventive services as well  as general preventive health recommendations were provided to patient.     Rossie Muskrat, [place]   02/25/2022   Nurse Notes: N/A

## 2022-02-25 NOTE — Patient Instructions (Signed)
It was great speaking with you today!  Please schedule your next Medicare Wellness Visit with your Nurse Health Advisor in 1 year by calling [phone]. 

## 2022-04-08 ENCOUNTER — Ambulatory Visit: Payer: Medicare Other | Admitting: Internal Medicine

## 2022-04-08 ENCOUNTER — Encounter: Payer: Self-pay | Admitting: Internal Medicine

## 2022-04-08 VITALS — BP 142/96 | HR 90 | Temp 98.0°F | Ht 73.0 in | Wt 167.0 lb

## 2022-04-08 DIAGNOSIS — F5101 Primary insomnia: Secondary | ICD-10-CM

## 2022-04-08 DIAGNOSIS — E1165 Type 2 diabetes mellitus with hyperglycemia: Secondary | ICD-10-CM

## 2022-04-08 DIAGNOSIS — Z0001 Encounter for general adult medical examination with abnormal findings: Secondary | ICD-10-CM | POA: Diagnosis not present

## 2022-04-08 DIAGNOSIS — Z8589 Personal history of malignant neoplasm of other organs and systems: Secondary | ICD-10-CM | POA: Insufficient documentation

## 2022-04-08 DIAGNOSIS — E559 Vitamin D deficiency, unspecified: Secondary | ICD-10-CM | POA: Diagnosis not present

## 2022-04-08 DIAGNOSIS — E538 Deficiency of other specified B group vitamins: Secondary | ICD-10-CM

## 2022-04-08 DIAGNOSIS — J449 Chronic obstructive pulmonary disease, unspecified: Secondary | ICD-10-CM | POA: Diagnosis not present

## 2022-04-08 DIAGNOSIS — I10 Essential (primary) hypertension: Secondary | ICD-10-CM

## 2022-04-08 DIAGNOSIS — E782 Mixed hyperlipidemia: Secondary | ICD-10-CM

## 2022-04-08 DIAGNOSIS — N1832 Chronic kidney disease, stage 3b: Secondary | ICD-10-CM

## 2022-04-08 LAB — LIPID PANEL
Cholesterol: 125 mg/dL (ref 0–200)
HDL: 37.8 mg/dL — ABNORMAL LOW (ref >39.00)
LDL Cholesterol: 66 mg/dL (ref 0–99)
NonHDL: 86.73
Total CHOL/HDL Ratio: 3
Triglycerides: 103 mg/dL (ref 0.0–149.0)
VLDL: 20.6 mg/dL (ref 0.0–40.0)

## 2022-04-08 LAB — VITAMIN D 25 HYDROXY (VIT D DEFICIENCY, FRACTURES): VITD: 82.81 ng/mL (ref 30.00–100.00)

## 2022-04-08 LAB — CBC WITH DIFFERENTIAL/PLATELET
Basophils Absolute: 0.1 10*3/uL (ref 0.0–0.1)
Basophils Relative: 0.7 % (ref 0.0–3.0)
Eosinophils Absolute: 0.1 10*3/uL (ref 0.0–0.7)
Eosinophils Relative: 1 % (ref 0.0–5.0)
HCT: 46.2 % (ref 39.0–52.0)
Hemoglobin: 14.5 g/dL (ref 13.0–17.0)
Lymphocytes Relative: 18.9 % (ref 12.0–46.0)
Lymphs Abs: 1.3 10*3/uL (ref 0.7–4.0)
MCHC: 31.3 g/dL (ref 30.0–36.0)
MCV: 78.4 fl (ref 78.0–100.0)
Monocytes Absolute: 0.7 10*3/uL (ref 0.1–1.0)
Monocytes Relative: 9.8 % (ref 3.0–12.0)
Neutro Abs: 4.7 10*3/uL (ref 1.4–7.7)
Neutrophils Relative %: 69.6 % (ref 43.0–77.0)
Platelets: 203 10*3/uL (ref 150.0–400.0)
RBC: 5.89 Mil/uL — ABNORMAL HIGH (ref 4.22–5.81)
RDW: 19.9 % — ABNORMAL HIGH (ref 11.5–15.5)
WBC: 6.7 10*3/uL (ref 4.0–10.5)

## 2022-04-08 LAB — HEPATIC FUNCTION PANEL
ALT: 25 U/L (ref 0–53)
AST: 26 U/L (ref 0–37)
Albumin: 4.4 g/dL (ref 3.5–5.2)
Alkaline Phosphatase: 94 U/L (ref 39–117)
Bilirubin, Direct: 0.4 mg/dL — ABNORMAL HIGH (ref 0.0–0.3)
Total Bilirubin: 1.4 mg/dL — ABNORMAL HIGH (ref 0.2–1.2)
Total Protein: 7.7 g/dL (ref 6.0–8.3)

## 2022-04-08 LAB — MICROALBUMIN / CREATININE URINE RATIO
Creatinine,U: 119.1 mg/dL
Microalb Creat Ratio: 39.1 mg/g — ABNORMAL HIGH (ref 0.0–30.0)
Microalb, Ur: 46.6 mg/dL — ABNORMAL HIGH (ref 0.0–1.9)

## 2022-04-08 LAB — URINALYSIS, ROUTINE W REFLEX MICROSCOPIC
Bilirubin Urine: NEGATIVE
Hgb urine dipstick: NEGATIVE
Ketones, ur: NEGATIVE
Leukocytes,Ua: NEGATIVE
Nitrite: NEGATIVE
RBC / HPF: NONE SEEN (ref 0–?)
Specific Gravity, Urine: 1.015 (ref 1.000–1.030)
Total Protein, Urine: 100 — AB
Urine Glucose: NEGATIVE
Urobilinogen, UA: 0.2 (ref 0.0–1.0)
pH: 7.5 (ref 5.0–8.0)

## 2022-04-08 LAB — BASIC METABOLIC PANEL
BUN: 37 mg/dL — ABNORMAL HIGH (ref 6–23)
CO2: 30 mEq/L (ref 19–32)
Calcium: 9.4 mg/dL (ref 8.4–10.5)
Chloride: 104 mEq/L (ref 96–112)
Creatinine, Ser: 1.81 mg/dL — ABNORMAL HIGH (ref 0.40–1.50)
GFR: 32.59 mL/min — ABNORMAL LOW (ref >60.00)
Glucose, Bld: 102 mg/dL — ABNORMAL HIGH (ref 70–99)
Potassium: 4.9 mEq/L (ref 3.5–5.1)
Sodium: 140 mEq/L (ref 135–145)

## 2022-04-08 LAB — VITAMIN B12: Vitamin B-12: 291 pg/mL (ref 211–911)

## 2022-04-08 LAB — TSH: TSH: 6.97 u[IU]/mL — ABNORMAL HIGH (ref 0.35–5.50)

## 2022-04-08 LAB — HEMOGLOBIN A1C: Hgb A1c MFr Bld: 7 % — ABNORMAL HIGH (ref 4.6–6.5)

## 2022-04-08 MED ORDER — TRAZODONE HCL 50 MG PO TABS: 50.0000 mg | ORAL_TABLET | Freq: Every day | ORAL | 1 refills | Status: DC

## 2022-04-08 MED ORDER — ALBUTEROL SULFATE HFA 108 (90 BASE) MCG/ACT IN AERS: 2.0000 | INHALATION_SPRAY | Freq: Four times a day (QID) | RESPIRATORY_TRACT | 11 refills | Status: DC | PRN

## 2022-04-08 NOTE — Progress Notes (Signed)
Patient ID: Daniel Woodard, male   DOB: 22-Jun-1929, 88 y.o.   MRN: [id number]         Chief Complaint:: wellness exam and Follow-up (Not Sleeping, No Energy and when he does move breathing is hard)         HPI:  Daniel Woodard is a 88 y.o. male here for wellness exam; declines covid booster, shingrix o/w up to date                        Also .Pt denies chest pain, wheezing, orthopnea, PND, increased LE swelling, palpitations, dizziness or syncope, but has at least mild to mod difficulty breathing as he has consistently refused inhalers in the past.  Daughter very concerned as he breathes hard on exhale at even 50 ft.  Also has worsening sleep difficutly as he gets to sleep about 10 pm, but usually wakes at 1-2am and not able  Pt denies polydipsia, polyuria, or new focal neuro s/s.    Pt denies fever, wt loss, night sweats, loss of appetite, or other constitutional symptoms     Wt Readings from Last 3 Encounters:  04/08/22 167 lb (75.8 kg)  01/17/22 170 lb 3.2 oz (77.2 kg)  11/22/21 168 lb 9.6 oz (76.5 kg)   BP Readings from Last 3 Encounters:  04/08/22 (!) 142/96  01/17/22 132/70  11/22/21 (!) 144/80   Immunization History  Administered Date(s) Administered   Fluad Quad(high Dose 65+) 02/27/2019   H1N1 05/05/2008   Influenza Split 05/19/2011, 03/06/2012   Influenza Whole 03/30/2000, 03/17/2008, 04/08/2009, 03/17/2010   Influenza, High Dose Seasonal PF 02/03/2015, 02/10/2016, 03/10/2017   Influenza,inj,Quad PF,6+ Mos 03/07/2013, 02/13/2014   Influenza-Unspecified 03/08/2022   PFIZER(Purple Top)SARS-COV-2 Vaccination 06/13/2019, 07/03/2019, 03/03/2020, 09/08/2020, 03/10/2021   Pneumococcal Conjugate-13 05/21/2013   Pneumococcal Polysaccharide-23 03/30/2005, 05/13/2010   Td 07/29/1998, 04/08/2009   Tdap 11/22/2018   Zoster, Live 04/07/2008   Health Maintenance Due  Topic Date Due   HEMOGLOBIN A1C  03/23/2022      Past Medical History:  Diagnosis Date   ALLERGIC RHINITIS  04/09/2007   Atrial fibrillation ([place])    BACTEREMIA, 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   BENIGN PROSTATIC HYPERTROPHY 04/09/2007   Blood transfusion without reported diagnosis    really not sure thinks 30-40 years ago   BRADYCARDIA 05/11/2009   Carotid artery occlusion    COLONIC POLYPS, HX OF 04/09/2007   ADENOMATOUS POLYPS 2004,2008   Complete heart block ([facility])    COPD 04/09/2007   Cough 03/27/2008   DEPRESSION 04/09/2007   DIABETES MELLITUS, TYPE II 10/09/2007   DIVERTICULOSIS, COLON 04/09/2007   DIZZINESS 10/06/2008   GLUCOSE INTOLERANCE 04/09/2007   HYPERLIPIDEMIA 03/27/2007   HYPERTENSION 03/27/2007   Kidney stone    PERIPHERAL VASCULAR DISEASE 03/27/2007   PNEUMONIA ORGANISM NOS 11/08/2007   SHINGLES, HX OF 04/09/2007   SHOULDER PAIN, BILATERAL 08/01/2007   Ulcer    40 years ago   Past Surgical History:  Procedure Laterality Date   APPENDECTOMY     88 years old   BIV UPGRADE N/A 08/14/2020   Procedure: BIV PPM UPGRADE;  Surgeon: Evans Lance, MD;  Location: [facility];  Service: Cardiovascular;  Laterality: N/A;   CAROTID ENDARTERECTOMY  5/08   left   CARPAL TUNNEL RELEASE     bilateral   COLONOSCOPY     ESOPHAGOGASTRODUODENOSCOPY  06/07/2004   HEMORRHOID BANDING  1990s   INCISION AND DRAINAGE ABSCESS N/A 06/08/2021  Procedure: INCISION AND DRAINAGE ABSCESS;  Surgeon: Kinsinger, Arta Bruce, MD;  Location: [facility];  Service: General;  Laterality: N/A;   LEFT HEART CATH AND CORONARY ANGIOGRAPHY N/A 06/02/2020   Procedure: LEFT HEART CATH AND CORONARY ANGIOGRAPHY;  Surgeon: Jettie Booze, MD;  Location: [facility];  Service: Cardiovascular;  Laterality: N/A;   PACEMAKER INSERTION  07/11/2013   MDT ADDRL1 pacemaker implanted by Dr Lovena Le for complete heart block   PERMANENT PACEMAKER INSERTION N/A 07/11/2013   Procedure: PERMANENT PACEMAKER INSERTION;  Surgeon: Evans Lance, MD;  Location: [facility];  Service: Cardiovascular;  Laterality:  N/A;   s/p PUD surgury     ? partial gastrectomy    reports that he quit smoking about 22 years ago. His smoking use included cigarettes. He has a 50.00 pack-year smoking history. He has never used smokeless tobacco. He reports that he does not drink alcohol and does not use drugs. family history includes Heart disease in his sister and another family member; Hypertension in his father, mother, sister, sister, sister, and sister; Lung cancer in his sister; Peripheral vascular disease in his brother; Stroke in an other family member; Ulcers in his father and sister. Allergies  Allergen Reactions   Ace Inhibitors Cough        Current Outpatient Medications on File Prior to Visit  Medication Sig Dispense Refill   furosemide (LASIX) 40 MG tablet Take 1 tablet (40 mg total) by mouth daily. 90 tablet 3   Magnesium 400 MG TABS Take 400 mg by mouth daily.     metoprolol succinate (TOPROL-XL) 25 MG 24 hr tablet Take 1 tablet (25 mg total) by mouth daily. 90 tablet 1   omeprazole (PRILOSEC) 20 MG capsule Take 1 capsule by mouth once daily 90 capsule 3   potassium chloride SA (KLOR-CON M) 20 MEQ tablet Take 1 tablet (20 mEq total) by mouth daily. 90 tablet 3   psyllium (METAMUCIL) 58.6 % packet Take 1 packet by mouth as needed.     QUERCETIN PO Take 3 tablets by mouth 2 (two) times daily. With Vitamin C     rosuvastatin (CRESTOR) 20 MG tablet Take 1 tablet (20 mg total) by mouth at bedtime. 30 tablet 0   VITAMIN D PO Take 5,000 Units by mouth daily.     vitamin k 100 MCG tablet Take 100 mcg by mouth daily.     zinc gluconate 50 MG tablet Take 50 mg by mouth 2 (two) times daily.     ASPIRIN 81 PO Take 81 mg by mouth daily. (Patient not taking: Reported on 04/08/2022)     No current facility-administered medications on file prior to visit.        ROS:  All others reviewed and negative.  Objective        PE:  BP (!) 142/96 (BP Location: Left Arm, Patient Position: Sitting, Cuff Size: Normal)    Pulse 90   Temp 98 F (36.7 C) (Oral)   Ht _0  (1.854 m)   Wt 167 lb (75.8 kg)   SpO2 98%   BMI 22.03 kg/m                 Constitutional: Pt appears in NAD               HENT: Head: NCAT.                Right Ear: External ear normal.  Left Ear: External ear normal.                Eyes: . Pupils are equal, round, and reactive to light. Conjunctivae and EOM are normal               Nose: without d/c or deformity               Neck: Neck supple. Gross normal ROM               Cardiovascular: Normal rate and regular rhythm.                 Pulmonary/Chest: Effort normal and breath sounds without rales or wheezing.                Abd:  Soft, NT, ND, + BS, no organomegaly               Neurological: Pt is alert. At baseline orientation, motor grossly intact               Skin: Skin is warm. No rashes, no other new lesions, LE edema - none               Psychiatric: Pt behavior is normal without agitation   Micro: none  Cardiac tracings I have personally interpreted today:  none  Pertinent Radiological findings (summarize): none   Lab Results  Component Value Date   WBC 10.8 (H) 06/09/2021   HGB 12.3 (L) 06/09/2021   HCT 38.2 (L) 06/09/2021   PLT 235 06/09/2021   GLUCOSE 111 (H) 09/21/2021   CHOL 140 09/21/2021   TRIG 114.0 09/21/2021   HDL 35.70 (L) 09/21/2021   LDLDIRECT 189.9 10/02/2007   LDLCALC 82 09/21/2021   ALT 19 09/21/2021   AST 23 09/21/2021   NA 136 09/21/2021   K 4.5 09/21/2021   CL 101 09/21/2021   CREATININE 1.76 (H) 09/21/2021   BUN 36 (H) 09/21/2021   CO2 28 09/21/2021   TSH 2.473 06/08/2021   PSA 0.92 11/20/2019   INR 1.1 03/06/2019   HGBA1C 6.5 09/21/2021   MICROALBUR 10.1 (H) 03/18/2021   Assessment/Plan:  Daniel Woodard is a 88 y.o. White or Caucasian [1] male with  has a past medical history of ALLERGIC RHINITIS (04/09/2007), Atrial fibrillation ([place]), BACTEREMIA, MYCOBACTERIUM AVIUM COMPLEX (04/09/2007), BENIGN PROSTATIC  HYPERTROPHY (04/09/2007), Blood transfusion without reported diagnosis, BRADYCARDIA (05/11/2009), Carotid artery occlusion, COLONIC POLYPS, HX OF (04/09/2007), Complete heart block ([place]), COPD (04/09/2007), Cough (03/27/2008), DEPRESSION (04/09/2007), DIABETES MELLITUS, TYPE II (10/09/2007), DIVERTICULOSIS, COLON (04/09/2007), DIZZINESS (10/06/2008), GLUCOSE INTOLERANCE (04/09/2007), HYPERLIPIDEMIA (03/27/2007), HYPERTENSION (03/27/2007), Kidney stone, PERIPHERAL VASCULAR DISEASE (03/27/2007), PNEUMONIA ORGANISM NOS (11/08/2007), SHINGLES, HX OF (04/09/2007), SHOULDER PAIN, BILATERAL (08/01/2007), and Ulcer.  Encounter for well adult exam with abnormal findings Age and sex appropriate education and counseling updated with regular exercise and diet Referrals for preventative services - none needed Immunizations addressed - declines covid booster and shingrix Smoking counseling  - none needed Evidence for depression or other mood disorder - none significant Most recent labs reviewed. I have personally reviewed and have noted: 1) the patient's medical and social history 2) The patient's current medications and supplements 3) The patient's height, weight, and BMI have been recorded in the chart   Vitamin D deficiency Last vitamin D Lab Results  Component Value Date   VD25OH 70.77 09/21/2021   Stable, cont oral replacement   Hyperlipidemia Lab Results  Component Value Date   LDLCALC 82 09/21/2021  Uncontrolled, pt to continue current statin crestor 20 mg, f/u lipids today   Essential hypertension BP Readings from Last 3 Encounters:  04/08/22 (!) 142/96  01/17/22 132/70  11/22/21 (!) 144/80   Uncontrolled,, pt to continue medical treatment toprol xl 25 mg qd as declines change for now   Diabetes [facility]) Lab Results  Component Value Date   HGBA1C 6.5 09/21/2021   Stable, pt to continue current medical treatment  - diet, wt control   CKD (chronic kidney disease), stage III  ([place]) Lab Results  Component Value Date   CREATININE 1.76 (H) 09/21/2021   Stable overall, cont to avoid nephrotoxins, for f/u lab today  Insomnia Worsening recently, for trazodone 50 mg qhs prn  COPD GOLD II / AB component  Pt may not comply, but I have rx albuterol hfa prn  Followup: Return in about 6 months (around 10/07/2022).  Cathlean Cower, MD 04/08/2022 1:09 PM [facility]

## 2022-04-08 NOTE — Assessment & Plan Note (Signed)
Worsening recently, for trazodone 50 mg qhs prn

## 2022-04-08 NOTE — Patient Instructions (Signed)
Please take all new medication as prescribed - the trazodone for sleep as needed, and the Albuterol inhaler if not too expensive  Please continue all other medications as before, and refills have been done if requested.  Please have the pharmacy call with any other refills you may need.  Please continue your efforts at being more active, low cholesterol diet, and weight control.  You are otherwise up to date with prevention measures today.  Please keep your appointments with your specialists as you may have planned  Please go to the LAB at the blood drawing area for the tests to be done  You will be contacted by phone if any changes need to be made immediately.  Otherwise, you will receive a letter about your results with an explanation, but please check with MyChart first.  Please remember to sign up for MyChart if you have not done so, as this will be important to you in the future with finding out test results, communicating by private email, and scheduling acute appointments online when needed.  Please make an Appointment to return in 6 months, or sooner if needed

## 2022-04-08 NOTE — Assessment & Plan Note (Signed)
Lab Results  Component Value Date   CREATININE 1.76 (H) 09/21/2021   Stable overall, cont to avoid nephrotoxins, for f/u lab today

## 2022-04-08 NOTE — Assessment & Plan Note (Signed)
Age and sex appropriate education and counseling updated with regular exercise and diet Referrals for preventative services - none needed Immunizations addressed - declines covid booster and shingrix Smoking counseling  - none needed Evidence for depression or other mood disorder - none significant Most recent labs reviewed. I have personally reviewed and have noted: 1) the patient's medical and social history 2) The patient's current medications and supplements 3) The patient's height, weight, and BMI have been recorded in the chart

## 2022-04-08 NOTE — Assessment & Plan Note (Signed)
Last vitamin D Lab Results  Component Value Date   VD25OH 70.77 09/21/2021   Stable, cont oral replacement

## 2022-04-08 NOTE — Assessment & Plan Note (Signed)
Pt may not comply, but I have rx albuterol hfa prn

## 2022-04-08 NOTE — Assessment & Plan Note (Signed)
Lab Results  Component Value Date   LDLCALC 82 09/21/2021   Uncontrolled, pt to continue current statin crestor 20 mg, f/u lipids today

## 2022-04-08 NOTE — Assessment & Plan Note (Signed)
BP Readings from Last 3 Encounters:  04/08/22 (!) 142/96  01/17/22 132/70  11/22/21 (!) 144/80   Uncontrolled,, pt to continue medical treatment toprol xl 25 mg qd as declines change for now

## 2022-04-08 NOTE — Assessment & Plan Note (Signed)
Lab Results  Component Value Date   HGBA1C 6.5 09/21/2021   Stable, pt to continue current medical treatment  - diet, wt control

## 2022-05-13 ENCOUNTER — Ambulatory Visit (INDEPENDENT_AMBULATORY_CARE_PROVIDER_SITE_OTHER): Payer: Medicare Other

## 2022-05-13 DIAGNOSIS — I441 Atrioventricular block, second degree: Secondary | ICD-10-CM

## 2022-05-13 LAB — CUP PACEART REMOTE DEVICE CHECK
Battery Remaining Longevity: 117 mo
Battery Voltage: 3 V
Brady Statistic AP VP Percent: 8.73 %
Brady Statistic AP VS Percent: 0.01 %
Brady Statistic AS VP Percent: 91.2 %
Brady Statistic AS VS Percent: 0.06 %
Brady Statistic RA Percent Paced: 8.72 %
Brady Statistic RV Percent Paced: 99.92 %
Date Time Interrogation Session: 20231214194823
Implantable Lead Connection Status: 753985
Implantable Lead Connection Status: 753985
Implantable Lead Connection Status: 753985
Implantable Lead Implant Date: 20150212
Implantable Lead Implant Date: 20150212
Implantable Lead Implant Date: 20220318
Implantable Lead Location: 753858
Implantable Lead Location: 753859
Implantable Lead Location: 753860
Implantable Lead Model: 4598
Implantable Lead Model: 5076
Implantable Lead Model: 5076
Implantable Pulse Generator Implant Date: 20220318
Lead Channel Impedance Value: 304 Ohm
Lead Channel Impedance Value: 323 Ohm
Lead Channel Impedance Value: 342 Ohm
Lead Channel Impedance Value: 380 Ohm
Lead Channel Impedance Value: 380 Ohm
Lead Channel Impedance Value: 380 Ohm
Lead Channel Impedance Value: 418 Ohm
Lead Channel Impedance Value: 494 Ohm
Lead Channel Impedance Value: 532 Ohm
Lead Channel Impedance Value: 627 Ohm
Lead Channel Impedance Value: 665 Ohm
Lead Channel Impedance Value: 760 Ohm
Lead Channel Impedance Value: 798 Ohm
Lead Channel Impedance Value: 817 Ohm
Lead Channel Pacing Threshold Amplitude: 0.625 V
Lead Channel Pacing Threshold Amplitude: 0.75 V
Lead Channel Pacing Threshold Amplitude: 1 V
Lead Channel Pacing Threshold Pulse Width: 0.4 ms
Lead Channel Pacing Threshold Pulse Width: 0.4 ms
Lead Channel Pacing Threshold Pulse Width: 0.5 ms
Lead Channel Sensing Intrinsic Amplitude: 15 mV
Lead Channel Sensing Intrinsic Amplitude: 15 mV
Lead Channel Sensing Intrinsic Amplitude: 4.5 mV
Lead Channel Sensing Intrinsic Amplitude: 4.5 mV
Lead Channel Setting Pacing Amplitude: 1.25 V
Lead Channel Setting Pacing Amplitude: 1.5 V
Lead Channel Setting Pacing Amplitude: 2 V
Lead Channel Setting Pacing Pulse Width: 0.4 ms
Lead Channel Setting Pacing Pulse Width: 0.5 ms
Lead Channel Setting Sensing Sensitivity: 0.9 mV
Zone Setting Status: 755011

## 2022-06-01 NOTE — Progress Notes (Signed)
Remote pacemaker transmission.   

## 2022-06-02 ENCOUNTER — Encounter: Payer: Self-pay | Admitting: Cardiovascular Disease

## 2022-06-02 ENCOUNTER — Ambulatory Visit: Payer: Medicare Other | Attending: Cardiovascular Disease | Admitting: Cardiovascular Disease

## 2022-06-02 VITALS — BP 107/77 | HR 77 | Ht 73.0 in | Wt 168.0 lb

## 2022-06-02 DIAGNOSIS — I10 Essential (primary) hypertension: Secondary | ICD-10-CM

## 2022-06-02 DIAGNOSIS — I342 Nonrheumatic mitral (valve) stenosis: Secondary | ICD-10-CM | POA: Diagnosis not present

## 2022-06-02 DIAGNOSIS — I350 Nonrheumatic aortic (valve) stenosis: Secondary | ICD-10-CM

## 2022-06-02 DIAGNOSIS — I5042 Chronic combined systolic (congestive) and diastolic (congestive) heart failure: Secondary | ICD-10-CM

## 2022-06-02 DIAGNOSIS — I2510 Atherosclerotic heart disease of native coronary artery without angina pectoris: Secondary | ICD-10-CM

## 2022-06-02 DIAGNOSIS — E1122 Type 2 diabetes mellitus with diabetic chronic kidney disease: Secondary | ICD-10-CM

## 2022-06-02 DIAGNOSIS — I5043 Acute on chronic combined systolic (congestive) and diastolic (congestive) heart failure: Secondary | ICD-10-CM | POA: Diagnosis not present

## 2022-06-02 DIAGNOSIS — I739 Peripheral vascular disease, unspecified: Secondary | ICD-10-CM

## 2022-06-02 DIAGNOSIS — E782 Mixed hyperlipidemia: Secondary | ICD-10-CM

## 2022-06-02 DIAGNOSIS — I708 Atherosclerosis of other arteries: Secondary | ICD-10-CM

## 2022-06-02 DIAGNOSIS — Z950 Presence of cardiac pacemaker: Secondary | ICD-10-CM | POA: Diagnosis not present

## 2022-06-02 DIAGNOSIS — N1832 Chronic kidney disease, stage 3b: Secondary | ICD-10-CM

## 2022-06-02 MED ORDER — EMPAGLIFLOZIN 10 MG PO TABS: 10.0000 mg | ORAL_TABLET | Freq: Every day | ORAL | 5 refills | Status: DC

## 2022-06-02 NOTE — Patient Instructions (Signed)
Medication Instructions:  START jardiance '10mg'$  daily CONTINUE all other current medications  *If you need a refill on your cardiac medications before your next appointment, please call your pharmacy*   Lab Work: Non-Fasting lab work in 3-4 weeks BMET, BNP  If you have labs (blood work) drawn today and your tests are completely normal, you will receive your results only by: [place] (if you have MyChart) OR A paper copy in the mail If you have any lab test that is abnormal or we need to change your treatment, we will call you to review the results.   Testing/Procedures: NONE   Follow-Up: At [facility], you and your health needs are our priority.  As part of our continuing mission to provide you with exceptional heart care, we have created designated Provider Care Teams.  These Care Teams include your primary Cardiologist (physician) and Advanced Practice Providers (APPs -  Physician Assistants and Nurse Practitioners) who all work together to provide you with the care you need, when you need it.  We recommend signing up for the patient portal called "MyChart".  Sign up information is provided on this After Visit Summary.  MyChart is used to connect with patients for Virtual Visits (Telemedicine).  Patients are able to view lab/test results, encounter notes, upcoming appointments, etc.  Non-urgent messages can be sent to your provider as well.   To learn more about what you can do with MyChart, go to [email or web address].    Your next appointment:    4-5 weeks with NP or PA  3-4 months with Dr. Sallyanne Kuster

## 2022-06-03 ENCOUNTER — Other Ambulatory Visit: Payer: Self-pay | Admitting: Internal Medicine

## 2022-06-03 NOTE — Telephone Encounter (Signed)
Please refill as per office routine med refill policy (all routine meds to be refilled for 3 mo or monthly (per pt preference) up to one year from last visit, then month to month grace period for 3 mo, then further med refills will have to be denied) ? ?

## 2022-06-04 ENCOUNTER — Encounter: Payer: Self-pay | Admitting: Cardiovascular Disease

## 2022-06-04 DIAGNOSIS — I342 Nonrheumatic mitral (valve) stenosis: Secondary | ICD-10-CM | POA: Insufficient documentation

## 2022-06-04 DIAGNOSIS — I2510 Atherosclerotic heart disease of native coronary artery without angina pectoris: Secondary | ICD-10-CM | POA: Insufficient documentation

## 2022-06-04 DIAGNOSIS — I350 Nonrheumatic aortic (valve) stenosis: Secondary | ICD-10-CM | POA: Insufficient documentation

## 2022-06-04 NOTE — Progress Notes (Signed)
Cardiology Office Note:    Date:  06/04/2022   ID:  OHN BOSTIC, DOB 04/13/1930, MRN [id number]  PCP:  Biagio Borg, MD   [facility] Cardiologist:  Sanda Klein, MD Electrophysiologist:  Cristopher Peru, MD     Referring MD: Biagio Borg, MD   Chief Complaint  Patient presents with   Shortness of Breath    History of Present Illness:    Daniel Woodard is a 89 y.o. male with a hx of chronic diastolic heart failure, Mobitz type II second-degree AV block, dual-chamber biventricular pacemaker (upgrade to biventricular device Percepta Quad CRT-P, 07/13/2020 Dr. Lovena Le), hypercholesterolemia, type 2 diabetes mellitus, CKD stage III, COPD, nonobstructive CAD, PAD (status post left carotid endarterectomy 08-01-1999, chronic occlusion of left subclavian artery).  He presents with several weeks complaints of worsening exertional dyspnea.  Appears to describe NYHA functional class III (gets short of breath sometimes when he puts his clothes on).  May also be describing some degree of orthopnea.  Does not have lower extremity edema.  Does not have chest pain at rest or with activity.  Has not had palpitations, dizziness, syncope, claudication or new focal neurological complaints.  Denies any falls or bleeding problems.  Echocardiogram performed in January 2023 shows LVEF 50-55% with mild LVH and signs of elevated end-diastolic pressure.  The left atrium is mildly dilated.  There was severe mitral annulus calcification with mild-moderate mitral stenosis (mean gradient 3 mmHg at heart rate 67) and mild-moderate aortic valve stenosis (mean gradient 10 mmHg, dimensionless index 0.38).  Pacemaker download today shows normal device function with only 8% atrial pacing and 99.8% biventricular pacing there has been no atrial fibrillation and very rare spells of nonsustained VT.  LV pacing is LV ring 3-LV tip.  The OptiVol shows evidence of fluid overload starting on 03/27/2022, ongoing at  the time of download 05/13/2022.  Surface ECG shows atrial sensed (sinus) ventricular paced rhythm with a short PR interval at 158 ms and a relatively narrow QRS at 128 ms with a distinct positive R wave in lead V1.  Cardiac catheterization in January 2022 shows moderate CAD with a 50-60% heavily calcified LAD artery stenosis at the origin of the first diagonal artery.  At that time the LVEDP was normal but was no evidence of aortic stenosis.  His wife passed away in 07-31-2018.  He is accompanied today by his daughter.  Past Medical History:  Diagnosis Date   ALLERGIC RHINITIS 04/09/2007   Atrial fibrillation ([place])    BACTEREMIA, 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   BENIGN PROSTATIC HYPERTROPHY 04/09/2007   Blood transfusion without reported diagnosis    really not sure thinks 30-40 years ago   BRADYCARDIA 05/11/2009   Carotid artery occlusion    COLONIC POLYPS, HX OF 04/09/2007   ADENOMATOUS POLYPS 07/31/02   Complete heart block (HCC)    COPD 04/09/2007   Cough 03/27/2008   DEPRESSION 04/09/2007   DIABETES MELLITUS, TYPE II 10/09/2007   DIVERTICULOSIS, COLON 04/09/2007   DIZZINESS 10/06/2008   GLUCOSE INTOLERANCE 04/09/2007   HYPERLIPIDEMIA 03/27/2007   HYPERTENSION 03/27/2007   Kidney stone    PERIPHERAL VASCULAR DISEASE 03/27/2007   PNEUMONIA ORGANISM NOS 11/08/2007   SHINGLES, HX OF 04/09/2007   SHOULDER PAIN, BILATERAL 08/01/2007   Ulcer    40 years ago    Past Surgical History:  Procedure Laterality Date   APPENDECTOMY     89 years old   BIV UPGRADE N/A 08/14/2020   Procedure:  BIV PPM UPGRADE;  Surgeon: Evans Lance, MD;  Location: [facility];  Service: Cardiovascular;  Laterality: N/A;   CAROTID ENDARTERECTOMY  5/08   left   CARPAL TUNNEL RELEASE     bilateral   COLONOSCOPY     ESOPHAGOGASTRODUODENOSCOPY  06/07/2004   HEMORRHOID BANDING  1990s   INCISION AND DRAINAGE ABSCESS N/A 06/08/2021   Procedure: INCISION AND DRAINAGE ABSCESS;  Surgeon:  Kieth Brightly Arta Bruce, MD;  Location: [facility];  Service: General;  Laterality: N/A;   LEFT HEART CATH AND CORONARY ANGIOGRAPHY N/A 06/02/2020   Procedure: LEFT HEART CATH AND CORONARY ANGIOGRAPHY;  Surgeon: Jettie Booze, MD;  Location: [facility];  Service: Cardiovascular;  Laterality: N/A;   PACEMAKER INSERTION  07/11/2013   MDT ADDRL1 pacemaker implanted by Dr Lovena Le for complete heart block   PERMANENT PACEMAKER INSERTION N/A 07/11/2013   Procedure: PERMANENT PACEMAKER INSERTION;  Surgeon: Evans Lance, MD;  Location: [facility];  Service: Cardiovascular;  Laterality: N/A;   s/p PUD surgury     ? partial gastrectomy    Current Medications: Current Meds  Medication Sig   empagliflozin (JARDIANCE) 10 MG TABS tablet Take 1 tablet (10 mg total) by mouth daily before breakfast.   furosemide (LASIX) 40 MG tablet Take 1 tablet (40 mg total) by mouth daily.   Magnesium 400 MG TABS Take 400 mg by mouth daily.   omeprazole (PRILOSEC) 20 MG capsule Take 1 capsule by mouth once daily   potassium chloride SA (KLOR-CON M) 20 MEQ tablet Take 1 tablet (20 mEq total) by mouth daily.   QUERCETIN PO Take 3 tablets by mouth 2 (two) times daily. With Vitamin C   rosuvastatin (CRESTOR) 20 MG tablet Take 1 tablet (20 mg total) by mouth at bedtime.   traZODone (DESYREL) 50 MG tablet Take 1 tablet (50 mg total) by mouth at bedtime.   VITAMIN D PO Take 5,000 Units by mouth daily.   vitamin k 100 MCG tablet Take 100 mcg by mouth daily.   zinc gluconate 50 MG tablet Take 50 mg by mouth 2 (two) times daily.   [DISCONTINUED] metoprolol succinate (TOPROL-XL) 25 MG 24 hr tablet Take 1 tablet (25 mg total) by mouth daily.     Allergies:   Ace inhibitors   Social History   Socioeconomic History   Marital status: Widowed    Spouse name: Not on file   Number of children: 2   Years of education: Not on file   Highest education level: Not on file  Occupational History   Occupation: retired [employer]ideote[employer] Tour manager: RETIRED  Tobacco Use   Smoking status: Former    Packs/day: 1.00    Years: 50.00    Total pack years: 50.00    Types: Cigarettes    Quit date: 08/20/1999    Years since quitting: 22.8   Smokeless tobacco: Never  Vaping Use   Vaping Use: Never used  Substance and Sexual Activity   Alcohol use: No    Alcohol/week: 0.0 standard drinks of alcohol   Drug use: No   Sexual activity: Never  Other Topics Concern   Not on file  Social History Narrative   Widowed; wife dies August 20, 2018   Daily caffeine    Social Determinants of Health   Financial Resource Strain: Low Risk  (02/25/2022)   Overall Financial Resource Strain (CARDIA)    Difficulty of Paying Living Expenses: Not hard at all  Food  Insecurity: No Food Insecurity (02/25/2022)   Hunger Vital Sign    Worried About Running Out of Food in the Last Year: Never true    Ran Out of Food in the Last Year: Never true  Transportation Needs: No Transportation Needs (02/25/2022)   PRAPARE - Hydrologist (Medical): No    Lack of Transportation (Non-Medical): No  Physical Activity: Inactive (02/25/2022)   Exercise Vital Sign    Days of Exercise per Week: 0 days    Minutes of Exercise per Session: 0 min  Stress: No Stress Concern Present (02/25/2022)   [place]    Feeling of Stress : Not at all  Social Connections: Unknown (02/25/2022)   Social Connection and Isolation Panel [NHANES]    Frequency of Communication with Friends and Family: More than three times a week    Frequency of Social Gatherings with Friends and Family: More than three times a week    Attends Religious Services: Never    Marine scientist or Organizations: No    Attends Music therapist: Never    Marital Status: Patient refused     Family History: The patient's family history includes Heart disease in his sister and  another family member; Hypertension in his father, mother, sister, sister, sister, and sister; Lung cancer in his sister; Peripheral vascular disease in his brother; Stroke in an other family member; Ulcers in his father and sister. There is no history of Colon cancer.  ROS:   Please see the history of present illness.     All other systems reviewed and are negative.  EKGs/Labs/Other Studies Reviewed:    The following studies were reviewed today: Cardiac catheterization 06/02/2020  Prox Cx lesion is 25% stenosed. Mid LAD lesion is 50-60% stenosed. Heavily calcified at the origin of the diagonal. 2nd Diag lesion is 50% stenosed. LV end diastolic pressure is normal. There is no aortic valve stenosis.   Medical therapy given lack of angina.  Degree of LAD disease should not cause akinesis of apex.  Echocardiogram 06/09/2021   1. Left ventricular ejection fraction, by estimation, is 50 to 55%. The  left ventricle has low normal function. The left ventricle demonstrates  global hypokinesis. There is mild concentric left ventricular hypertrophy.  Left ventricular diastolic  function could not be evaluated. Elevated left ventricular end-diastolic  pressure.   2. Right ventricular systolic function is normal. The right ventricular  size is normal.   3. Left atrial size was mildly dilated.   4. The mitral valve is degenerative. Trivial mitral valve regurgitation.  Mild to moderate mitral stenosis. The mean mitral valve gradient is 3.0  mmHg. Severe mitral annular calcification.   5. The aortic valve is calcified. Aortic valve regurgitation is mild.  Mild to moderate aortic valve stenosis. Aortic valve area, by VTI measures  0.97 cm. Aortic valve mean gradient measures 10.0 mmHg. Aortic valve Vmax  measures 2.16 m/s.   6. The inferior vena cava is normal in size with greater than 50%  respiratory variability, suggesting right atrial pressure of 3 mmHg.   7. Recommend definity contrast  study. The endocardial borders are poorly  defined in some views and cannot accurately assess EF. Gross estimate of  EF is around 50% which is improved from prior study, but again, images are  not optimal. Ther AV is not well   visualized. The DI and low SVI support some degree of low  flow low  gradient AS. Could consider TEE.     LV e' medial:    4.90 cm/s  LV E/e' medial:  22.4  LV e' lateral:   4.19 cm/s  LV E/e' lateral: 26.3   LV SV:         45  LV SV Index:   23   LEFT ATRIUM         Index  LA diam:    4.10 cm 2.04 cm/m    AORTIC VALVE                      AV Area (VTI):     0.97 cm  AV Vmax:           216.50 cm/s  AV Peak Grad:      18.7 mmHg  AV Mean Grad:      10.0 mmHg  LVOT/AV VTI ratio: 0.38   MITRAL VALVE  MV Area (PHT): 3.16 cm      MV Peak grad:  5.9 mmHg      MV Mean grad:  3.0 mmHg   EKG:  EKG is  ordered today.  The ekg ordered today demonstrates atrial sensed (sinus) ventricular paced rhythm with short PR interval and narrow QRS at 128 ms, positive R wave in lead V1.  Recent Labs: 06/08/2021: B Natriuretic Peptide 995.8 06/09/2021: Magnesium 2.3 04/08/2022: ALT 25; BUN 37; Creatinine, Ser 1.81; Hemoglobin 14.5; Platelets 203.0; Potassium 4.9; Sodium 140; TSH 6.97  Recent Lipid Panel    Component Value Date/Time   CHOL 125 04/08/2022 1143   TRIG 103.0 04/08/2022 1143   HDL 37.80 (L) 04/08/2022 1143   CHOLHDL 3 04/08/2022 1143   VLDL 20.6 04/08/2022 1143   LDLCALC 66 04/08/2022 1143   LDLDIRECT 189.9 10/02/2007 1107     Risk Assessment/Calculations:           Physical Exam:    VS:  BP 107/77 (BP Location: Right Arm, Patient Position: Sitting, Cuff Size: Normal)   Pulse 77   Ht _0  (1.854 m)   Wt 168 lb (76.2 kg)   SpO2 96%   BMI 22.16 kg/m     Wt Readings from Last 3 Encounters:  06/02/22 168 lb (76.2 kg)  04/08/22 167 lb (75.8 kg)  01/17/22 170 lb 3.2 oz (77.2 kg)     GEN:  Well nourished, well developed in no acute distress.   Elderly, does not appear frail.  Healthy left subclavian pacemaker site. HEENT: Normal NECK: No JVD; lateral carotid bruits LYMPHATICS: No lymphadenopathy CARDIAC: RRR, 2-3/6 mid peaking aortic ejection murmur, S2 is distinct, no diastolic murmurs, rubs, gallops RESPIRATORY:  Clear to auscultation without rales, wheezing or rhonchi  ABDOMEN: Soft, non-tender, non-distended MUSCULOSKELETAL:  No edema; No deformity  SKIN: Warm and dry NEUROLOGIC:  Alert and oriented x 3 PSYCHIATRIC:  Normal affect   ASSESSMENT:    1. Acute on chronic combined systolic and diastolic congestive heart failure ([place])   2. Aortic valve stenosis, nonrheumatic   3. Mitral valve stenosis, non-rheumatic   4. Biventricular cardiac pacemaker in situ   5. Essential hypertension   6. Type 2 diabetes mellitus with stage 3b chronic kidney disease, without long-term current use of insulin ([place])   7. Mixed hyperlipidemia   8. Stage 3b chronic kidney disease ([place])   9. Coronary artery disease involving native coronary artery of native heart without angina pectoris   10. Left subclavian artery occlusion   11. PAD (peripheral  artery disease) ([place])    PLAN:    In order of problems listed above:  CHF: Despite the absence of clinical signs of hypervolemia, he has symptoms of decompensated left heart failure.  Previous echocardiogram and BNP levels showed evidence of left heart failure and his pacemaker download has shown decreased thoracic impedance since late October.  He is aware of the need for sodium restricted diet, but has not been weighing himself regularly.  Based on office visit records, his weight really has not changed in the last 6 months.  Will add Jardiance 10 mg once daily and continue his furosemide.  Discussed the potential risks of medicines like Jardiance including groin yeast infections, urinary tract infections and the rare Fournier's gangrene.  Repeat labs to include electrolytes, renal parameters and BNP  level. AS: By most parameters, his aortic stenosis was only mild-moderate, but he did have evidence of low stroke-volume index.  Repeat echocardiogram to evaluate for possible paradoxical low-flow low gradient severe aortic stenosis. MS: mean gradient is only 3 mmHg at a heart rate of 67 bpm and a pressure half-time calculated mitral valve area is 3 cm.  Unlikely to be a major contributor to his heart failure at this time. CRT-P: Normal device function (followed by Dr. Lovena Le).  OptiVol shows evidence of heart failure exacerbation since late October. HTN: Blood pressure is relatively low.  Unlikely that we will be able to treatment with Entresto.  Consider adding a low-dose ARB if blood pressure allows at his next appointment. DM: Most recent hemoglobin A1c was adequate at 7.0%. HLP: Recent LDL was excellent at 66.  Borderline low HDL at 38.  Continue statin CKD3: Creatinine 1.8 seems to be at baseline.  Over the last 12 months creatinine range has been 1.44-2.09 (GFR 30-35). CAD: He does not have angina pectoris.  Avoid contrast based procedures if possible due to his renal dysfunction. PAD: Asymptomatic occlusion of left subclavian artery, with retrograde flow in the left vertebral artery.  No symptoms of vertebral steal syndrome.  Check blood pressure only on the right side.           Medication Adjustments/Labs and Tests Ordered: Current medicines are reviewed at length with the patient today.  Concerns regarding medicines are outlined above.  Orders Placed This Encounter  Procedures   Basic metabolic panel   Brain natriuretic peptide   EKG 12-Lead   ECHOCARDIOGRAM COMPLETE   Meds ordered this encounter  Medications   empagliflozin (JARDIANCE) 10 MG TABS tablet    Sig: Take 1 tablet (10 mg total) by mouth daily before breakfast.    Dispense:  30 tablet    Refill:  5    Patient Instructions  Medication Instructions:  START jardiance 3m daily CONTINUE all other current  medications  *If you need a refill on your cardiac medications before your next appointment, please call your pharmacy*   Lab Work: Non-Fasting lab work in 3-4 weeks BMET, BNP  If you have labs (blood work) drawn today and your tests are completely normal, you will receive your results only by: MyChart Message (if you have MyChart) OR A paper copy in the mail If you have any lab test that is abnormal or we need to change your treatment, we will call you to review the results.   Testing/Procedures: NONE   Follow-Up: [facility] you and your health needs are our priority.  As part of our continuing mission to provide you with exceptional heart care, we have  created designated Provider Care Teams.  These Care Teams include your primary Cardiologist (physician) and Advanced Practice Providers (APPs -  Physician Assistants and Nurse Practitioners) who all work together to provide you with the care you need, when you need it.  We recommend signing up for the patient portal called "MyChart".  Sign up information is provided on this After Visit Summary.  MyChart is used to connect with patients for Virtual Visits (Telemedicine).  Patients are able to view lab/test results, encounter notes, upcoming appointments, etc.  Non-urgent messages can be sent to your provider as well.   To learn more about what you can do with MyChart, go to [email or web address].    Your next appointment:    4-5 weeks with NP or PA  3-4 months with Dr. Sallyanne Kuster        Signed, Sanda Klein, MD  06/04/2022 10:58 AM    [place]

## 2022-06-06 ENCOUNTER — Telehealth: Payer: Self-pay

## 2022-06-06 NOTE — Telephone Encounter (Signed)
Called patient's daughter Helene Kelp left message on personal voice mail Dr.Croitoru wants your father to have a echo before his next appointment 2/8.Scheduler will be calling back with echo appointment.

## 2022-06-07 ENCOUNTER — Telehealth: Payer: Self-pay | Admitting: *Deleted

## 2022-06-07 NOTE — Telephone Encounter (Signed)
Jardiance patient assistance faxed to [facility].

## 2022-06-09 NOTE — Telephone Encounter (Signed)
Received fax from [facility] stating patient signature was missing from paperwork.  Signature included on all forms-refaxed paperwork.

## 2022-06-22 ENCOUNTER — Encounter: Payer: Self-pay | Admitting: Cardiovascular Disease

## 2022-06-22 DIAGNOSIS — I5043 Acute on chronic combined systolic (congestive) and diastolic (congestive) heart failure: Secondary | ICD-10-CM

## 2022-06-23 MED ORDER — EMPAGLIFLOZIN 10 MG PO TABS: 10.0000 mg | ORAL_TABLET | Freq: Every day | ORAL | 0 refills | Status: DC

## 2022-06-23 NOTE — Telephone Encounter (Signed)
Can we please have a couple of weeks of Jardiance 10 mg samples for him waiting at the front desk for Helene Kelp to pick up?

## 2022-06-29 NOTE — Progress Notes (Signed)
Cardiology Office Note:    Date:  07/07/2022   ID:  Daniel Woodard, DOB 05/18/1930, MRN [id number]  PCP:  Biagio Borg, MD   [facility] Cardiologist:  Sanda Klein, MD Electrophysiologist:  Cristopher Peru, MD     Referring MD: Biagio Borg, MD   Chief Complaint  Patient presents with   Follow-up    CHF, AS    History of Present Illness:    Daniel Woodard is a 89 y.o. male with a hx of chronic diastolic heart failure, Mobitz type II second-degree AV block, dual-chamber biventricular pacemaker-upgrade to biventricular device CRT-P 07/13/2020, hyperlipidemia, DM2, CKD stage III, COPD, nonobstructive CAD, PAD s/p left carotid endarterectomy 2001, chronic occlusion of left subclavian artery.  An echocardiogram January 2023 showed LVEF 50-55% with mild LVH and signs of elevated end-diastolic pressure, LAE, severe MAC with mild to moderate MS, mild to moderate AAS with a mean gradient 10 mmHg and DI 0.38.  OptiVol showed evidence of fluid overload 03/27/2022 through 12/50/23.  He was seen by Dr. Sallyanne Kuster 06/02/22 with signs and symptoms of volume overload. He was started on jardiance 10 mg and continued on lasix.  He was also recommended to repeat echocardiogram to evaluate aortic stenosis as he possibly could have low-flow low gradient AS.  He presents today for follow-up of his echocardiogram.  Per Dr. Sallyanne Kuster for echo 06/30/22: Aortic valve stenosis has not changed much.  It is probably still moderate.  Heart pumping strength is moderately decreased again, and there is once again evidence of substantial dyssynchrony, as there was before his device was upgraded to a biventricular pacemaker.  Concerned that we have lost cardiac resynchronization, although the EKG that I did on him at the beginning of January suggested we still have good LV capture.  Could we please get him an appointment in the device clinic or with Dr. Lovena Le to check to make sure we still have good CRT?    Fortunately they were seen on Tues and device function adjusted. He states he feels much better since starting jardiance. He has no complaints except for his feet.    Past Medical History:  Diagnosis Date   ALLERGIC RHINITIS 04/09/2007   Atrial fibrillation ([place])    BACTEREMIA, 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   BENIGN PROSTATIC HYPERTROPHY 04/09/2007   Blood transfusion without reported diagnosis    really not sure thinks 30-40 years ago   BRADYCARDIA 05/11/2009   Carotid artery occlusion    COLONIC POLYPS, HX OF 04/09/2007   ADENOMATOUS POLYPS 2004,2008   Complete heart block (HCC)    COPD 04/09/2007   Cough 03/27/2008   DEPRESSION 04/09/2007   DIABETES MELLITUS, TYPE II 10/09/2007   DIVERTICULOSIS, COLON 04/09/2007   DIZZINESS 10/06/2008   GLUCOSE INTOLERANCE 04/09/2007   HYPERLIPIDEMIA 03/27/2007   HYPERTENSION 03/27/2007   Kidney stone    PERIPHERAL VASCULAR DISEASE 03/27/2007   PNEUMONIA ORGANISM NOS 11/08/2007   SHINGLES, HX OF 04/09/2007   SHOULDER PAIN, BILATERAL 08/01/2007   Ulcer    40 years ago    Past Surgical History:  Procedure Laterality Date   APPENDECTOMY     89 years old   BIV UPGRADE N/A 08/14/2020   Procedure: BIV PPM UPGRADE;  Surgeon: Evans Lance, MD;  Location: [facility];  Service: Cardiovascular;  Laterality: N/A;   CAROTID ENDARTERECTOMY  5/08   left   CARPAL TUNNEL RELEASE     bilateral   COLONOSCOPY  ESOPHAGOGASTRODUODENOSCOPY  06/07/2004   HEMORRHOID BANDING  1990s   INCISION AND DRAINAGE ABSCESS N/A 06/08/2021   Procedure: INCISION AND DRAINAGE ABSCESS;  Surgeon: Kieth Brightly, Arta Bruce, MD;  Location: [facility];  Service: General;  Laterality: N/A;   LEFT HEART CATH AND CORONARY ANGIOGRAPHY N/A 06/02/2020   Procedure: LEFT HEART CATH AND CORONARY ANGIOGRAPHY;  Surgeon: Jettie Booze, MD;  Location: [facility];  Service: Cardiovascular;  Laterality: N/A;   PACEMAKER INSERTION  07/11/2013   MDT ADDRL1  pacemaker implanted by Dr Lovena Le for complete heart block   PERMANENT PACEMAKER INSERTION N/A 07/11/2013   Procedure: PERMANENT PACEMAKER INSERTION;  Surgeon: Evans Lance, MD;  Location: [facility];  Service: Cardiovascular;  Laterality: N/A;   s/p PUD surgury     ? partial gastrectomy    Current Medications: Current Meds  Medication Sig   empagliflozin (JARDIANCE) 10 MG TABS tablet Take 1 tablet (10 mg total) by mouth daily before breakfast.   furosemide (LASIX) 40 MG tablet Take 1 tablet (40 mg total) by mouth daily.   Magnesium 400 MG TABS Take 400 mg by mouth daily.   metoprolol succinate (TOPROL-XL) 25 MG 24 hr tablet Take 1/2 (one-half) tablet by mouth once daily   omeprazole (PRILOSEC) 20 MG capsule Take 1 capsule by mouth once daily   QUERCETIN PO Take 3 tablets by mouth 2 (two) times daily. With Vitamin C   rosuvastatin (CRESTOR) 20 MG tablet Take 1 tablet (20 mg total) by mouth at bedtime.   VITAMIN D PO Take 5,000 Units by mouth daily.   vitamin k 100 MCG tablet Take 100 mcg by mouth daily.   zinc gluconate 50 MG tablet Take 50 mg by mouth 2 (two) times daily.   [DISCONTINUED] potassium chloride SA (KLOR-CON M) 20 MEQ tablet Take 1 tablet (20 mEq total) by mouth daily.     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  Tobacco Use   Smoking status: Former    Packs/day: 1.00    Years: 50.00    Total pack years: 50.00    Types: Cigarettes    Quit date: 08/20/1999    Years since quitting: 22.8   Smokeless tobacco: Never  Vaping Use   Vaping Use: Never used  Substance and Sexual Activity   Alcohol use: No    Alcohol/week: 0.0 standard drinks of alcohol   Drug use: No   Sexual activity:  Never  Other Topics Concern   Not on file  Social History Narrative   Widowed; wife dies 09-18-18   Daily caffeine    Social Determinants of Health   Financial Resource Strain: Low Risk  (02/25/2022)   Overall Financial Resource Strain (CARDIA)    Difficulty of Paying Living Expenses: Not hard at all  Food Insecurity: No Food Insecurity (02/25/2022)   Hunger Vital Sign    Worried About Running Out of Food in the Last Year: Never true    Ran Out of Food in the Last Year: Never true  Transportation Needs: No Transportation Needs (02/25/2022)   PRAPARE - Hydrologist (Medical): No    Lack of Transportation (Non-Medical):  No  Physical Activity: Inactive (02/25/2022)   Exercise Vital Sign    Days of Exercise per Week: 0 days    Minutes of Exercise per Session: 0 min  Stress: No Stress Concern Present (02/25/2022)   [place]    Feeling of Stress : Not at all  Social Connections: Unknown (02/25/2022)   Social Connection and Isolation Panel [NHANES]    Frequency of Communication with Friends and Family: More than three times a week    Frequency of Social Gatherings with Friends and Family: More than three times a week    Attends Religious Services: Never    Marine scientist or Organizations: No    Attends Music therapist: Never    Marital Status: Patient refused     Family History: The patient's family history includes Heart disease in his sister and another family member; Hypertension in his father, mother, sister, sister, sister, and sister; Lung cancer in his sister; Peripheral vascular disease in his brother; Stroke in an other family member; Ulcers in his father and sister. There is no history of Colon cancer.  ROS:   Please see the history of present illness.     All other systems reviewed and are negative.  EKGs/Labs/Other Studies Reviewed:    The following studies  were reviewed today:  Echo 06/09/21: 1. Left ventricular ejection fraction, by estimation, is 50 to 55%. The  left ventricle has low normal function. The left ventricle demonstrates  global hypokinesis. There is mild concentric left ventricular hypertrophy.  Left ventricular diastolic  function could not be evaluated. Elevated left ventricular end-diastolic  pressure.   2. Right ventricular systolic function is normal. The right ventricular  size is normal.   3. Left atrial size was mildly dilated.   4. The mitral valve is degenerative. Trivial mitral valve regurgitation.  Mild to moderate mitral stenosis. The mean mitral valve gradient is 3.0  mmHg. Severe mitral annular calcification.   5. The aortic valve is calcified. Aortic valve regurgitation is mild.  Mild to moderate aortic valve stenosis. Aortic valve area, by VTI measures  0.97 cm. Aortic valve mean gradient measures 10.0 mmHg. Aortic valve Vmax  measures 2.16 m/s.   6. The inferior vena cava is normal in size with greater than 50%  respiratory variability, suggesting right atrial pressure of 3 mmHg.   7. Recommend definity contrast study. The endocardial borders are poorly  defined in some views and cannot accurately assess EF. Gross estimate of  EF is around 50% which is improved from prior study, but again, images are  not optimal. Ther AV is not well   visualized. The DI and low SVI support some degree of low flow low  gradient AS. Could consider TEE.    EKG:  EKG is not ordered today.    Recent Labs: 04/08/2022: ALT 25; Hemoglobin 14.5; Platelets 203.0; TSH 6.97 06/30/2022: BNP 1,839.8; BUN 44; Creatinine, Ser 1.83; Potassium 5.1; Sodium 138  Recent Lipid Panel    Component Value Date/Time   CHOL 125 04/08/2022 1143   TRIG 103.0 04/08/2022 1143   HDL 37.80 (L) 04/08/2022 1143   CHOLHDL 3 04/08/2022 1143   VLDL 20.6 04/08/2022 1143   LDLCALC 66 04/08/2022 1143   LDLDIRECT 189.9 10/02/2007 1107     Risk  Assessment/Calculations:                Physical Exam:    VS:  BP 135/73  Pulse 78   Ht 5' 9"  (1.753 m)   Wt 165 lb (74.8 kg)   SpO2 99%   BMI 24.37 kg/m     Wt Readings from Last 3 Encounters:  07/07/22 165 lb (74.8 kg)  06/02/22 168 lb (76.2 kg)  04/08/22 167 lb (75.8 kg)     GEN:  thin elderly male in no acute distress HEENT: Normal CARDIAC: RRR, + systolic murmur RESPIRATORY:  Clear to auscultation without rales, wheezing or rhonchi  ABDOMEN: Soft, non-tender, non-distended MUSCULOSKELETAL:  mild B LE edema, redness, crack on left heel, skin thinning on left inside ankle SKIN: Warm and dry NEUROLOGIC:  Alert and oriented x 3 PSYCHIATRIC:  Normal affect   ASSESSMENT:    1. Chronic combined systolic and diastolic congestive heart failure (HCC)   2. Stage 3b chronic kidney disease ([place])   3. Primary hypertension   4. PAD (peripheral artery disease) ([place])   5. Swelling of lower extremity   6. Aortic valve stenosis, nonrheumatic   7. Hyperlipidemia with target LDL less than 70   8. Coronary artery disease involving native coronary artery of native heart without angina pectoris    PLAN:    In order of problems listed above:  Chronic diastolic heart failure Hypertension CKD IIIb Had preserved EF on echo 06/09/2021 Repeat echocardiogram showed LVEF 30-35% with dyssynchrony  On jardiance and lasix Renal function declined with high normal potassium Unclear trend. Will stop potassium for now and recheck labs in 2 weeks.    Lower extremity redness and swelling  Exam shows split on his right heel and mild wound on left inside ankle - skin intact but thinning. He also needs toenail care. Will refer to podiatry. I have advised moisturizer with ointment and have asked the daughters to check his feet every time they visit him.    Aortic stenosis Stable on recent echo   CRT-P Followed by Dr. Lovena Le Device settings adjusted 07/05/22   Hyperlipidemia with LDL goal  less than 26 LDL was 67 Given age we will not attempt to push LDL lower Continue statin   CAD Will avoid contrast given his renal dysfunction No chest pain   PAD Hx of left carotid endarterectomy 2001 Chronic occlusion of left subclavian artery   Keep scheduled follow up.   Medication Adjustments/Labs and Tests Ordered: Current medicines are reviewed at length with the patient today.  Concerns regarding medicines are outlined above.  Orders Placed This Encounter  Procedures   Brain natriuretic peptide   Basic metabolic panel   Ambulatory referral to Podiatry   No orders of the defined types were placed in this encounter.   Patient Instructions  Medication Instructions:  Stop Potassium *If you need a refill on your cardiac medications before your next appointment, please call your pharmacy*   Lab Work: BNP, BMET To Be Done in 2 weeks If you have labs (blood work) drawn today and your tests are completely normal, you will receive your results only by: [place] (if you have MyChart) OR A paper copy in the mail If you have any lab test that is abnormal or we need to change your treatment, we will call you to review the results.   Testing/Procedures: No Testing   Follow-Up: At [facility], you and your health needs are our priority.  As part of our continuing mission to provide you with exceptional heart care, we have created designated Provider Care Teams.  These Care Teams include your primary Cardiologist (physician)  and Advanced Practice Providers (APPs -  Physician Assistants and Nurse Practitioners) who all work together to provide you with the care you need, when you need it.  We recommend signing up for the patient portal called "MyChart".  Sign up information is provided on this After Visit Summary.  MyChart is used to connect with patients for Virtual Visits (Telemedicine).  Patients are able to view lab/test results, encounter notes, upcoming  appointments, etc.  Non-urgent messages can be sent to your provider as well.   To learn more about what you can do with MyChart, go to [email or web address].    Your next appointment:   Follow up As Scheduled  Provider:   Sanda Klein, MD     Signed, [place], [place]  07/07/2022 2:17 PM    [place]

## 2022-06-30 ENCOUNTER — Ambulatory Visit (HOSPITAL_COMMUNITY): Payer: Medicare Other | Attending: Cardiology

## 2022-06-30 DIAGNOSIS — I350 Nonrheumatic aortic (valve) stenosis: Secondary | ICD-10-CM | POA: Diagnosis present

## 2022-06-30 DIAGNOSIS — I5043 Acute on chronic combined systolic (congestive) and diastolic (congestive) heart failure: Secondary | ICD-10-CM | POA: Diagnosis present

## 2022-06-30 LAB — ECHOCARDIOGRAM COMPLETE
AR max vel: 1.09 cm2
AV Area VTI: 1.08 cm2
AV Area mean vel: 1.12 cm2
AV Mean grad: 9 mmHg
AV Peak grad: 18 mmHg
Ao pk vel: 2.12 m/s
Area-P 1/2: 3.51 cm2
P 1/2 time: 486 msec
S' Lateral: 3.5 cm

## 2022-07-01 LAB — BASIC METABOLIC PANEL
BUN/Creatinine Ratio: 24 (ref 10–24)
BUN: 44 mg/dL — ABNORMAL HIGH (ref 10–36)
CO2: 23 mmol/L (ref 20–29)
Calcium: 9.1 mg/dL (ref 8.6–10.2)
Chloride: 99 mmol/L (ref 96–106)
Creatinine, Ser: 1.83 mg/dL — ABNORMAL HIGH (ref 0.76–1.27)
Glucose: 113 mg/dL — ABNORMAL HIGH (ref 70–99)
Potassium: 5.1 mmol/L (ref 3.5–5.2)
Sodium: 138 mmol/L (ref 134–144)
eGFR: 35 mL/min/{1.73_m2} — ABNORMAL LOW (ref >59)

## 2022-07-01 LAB — BRAIN NATRIURETIC PEPTIDE: BNP: 1839.8 pg/mL — ABNORMAL HIGH (ref 0.0–100.0)

## 2022-07-05 ENCOUNTER — Ambulatory Visit: Payer: Medicare Other | Attending: Cardiovascular Disease

## 2022-07-05 DIAGNOSIS — Z950 Presence of cardiac pacemaker: Secondary | ICD-10-CM

## 2022-07-07 ENCOUNTER — Encounter: Payer: Self-pay | Admitting: Physician Assistant

## 2022-07-07 ENCOUNTER — Ambulatory Visit: Payer: Medicare Other | Attending: Physician Assistant | Admitting: Physician Assistant

## 2022-07-07 VITALS — BP 135/73 | HR 78 | Ht 69.0 in | Wt 165.0 lb

## 2022-07-07 DIAGNOSIS — I10 Essential (primary) hypertension: Secondary | ICD-10-CM | POA: Diagnosis not present

## 2022-07-07 DIAGNOSIS — I5042 Chronic combined systolic (congestive) and diastolic (congestive) heart failure: Secondary | ICD-10-CM | POA: Diagnosis not present

## 2022-07-07 DIAGNOSIS — M7989 Other specified soft tissue disorders: Secondary | ICD-10-CM

## 2022-07-07 DIAGNOSIS — E785 Hyperlipidemia, unspecified: Secondary | ICD-10-CM

## 2022-07-07 DIAGNOSIS — N1832 Chronic kidney disease, stage 3b: Secondary | ICD-10-CM | POA: Diagnosis not present

## 2022-07-07 DIAGNOSIS — I739 Peripheral vascular disease, unspecified: Secondary | ICD-10-CM | POA: Diagnosis not present

## 2022-07-07 DIAGNOSIS — I350 Nonrheumatic aortic (valve) stenosis: Secondary | ICD-10-CM

## 2022-07-07 DIAGNOSIS — I2510 Atherosclerotic heart disease of native coronary artery without angina pectoris: Secondary | ICD-10-CM

## 2022-07-07 NOTE — Progress Notes (Signed)
Patient brought in to device clinic by Dr. Lovena Le to review programming and make changes to improve pacemaker settings in respect of recent ECHO findings.  This was at Dr. Victorino December request (per 06/30/22 lab results).   Guidell from [employer] present and assisted Dr. Lovena Le with interrogation and adjustment. See scanned in report exported into EPIC for full programming change details.

## 2022-07-07 NOTE — Patient Instructions (Addendum)
Medication Instructions:  Stop Potassium *If you need a refill on your cardiac medications before your next appointment, please call your pharmacy*   Lab Work: BNP, BMET To Be Done in 2 weeks If you have labs (blood work) drawn today and your tests are completely normal, you will receive your results only by: [place] (if you have MyChart) OR A paper copy in the mail If you have any lab test that is abnormal or we need to change your treatment, we will call you to review the results.   Testing/Procedures: No Testing   Follow-Up: At [facility], you and your health needs are our priority.  As part of our continuing mission to provide you with exceptional heart care, we have created designated Provider Care Teams.  These Care Teams include your primary Cardiologist (physician) and Advanced Practice Providers (APPs -  Physician Assistants and Nurse Practitioners) who all work together to provide you with the care you need, when you need it.  We recommend signing up for the patient portal called "MyChart".  Sign up information is provided on this After Visit Summary.  MyChart is used to connect with patients for Virtual Visits (Telemedicine).  Patients are able to view lab/test results, encounter notes, upcoming appointments, etc.  Non-urgent messages can be sent to your provider as well.   To learn more about what you can do with MyChart, go to [email or web address].    Your next appointment:   Follow up As Scheduled  Provider:   Sanda Klein, MD

## 2022-07-14 ENCOUNTER — Encounter: Payer: Self-pay | Admitting: Podiatry

## 2022-07-14 ENCOUNTER — Ambulatory Visit (INDEPENDENT_AMBULATORY_CARE_PROVIDER_SITE_OTHER): Payer: Medicare Other | Admitting: Podiatry

## 2022-07-14 DIAGNOSIS — M79675 Pain in left toe(s): Secondary | ICD-10-CM | POA: Diagnosis not present

## 2022-07-14 DIAGNOSIS — M79674 Pain in right toe(s): Secondary | ICD-10-CM

## 2022-07-14 DIAGNOSIS — L539 Erythematous condition, unspecified: Secondary | ICD-10-CM | POA: Diagnosis not present

## 2022-07-14 DIAGNOSIS — B351 Tinea unguium: Secondary | ICD-10-CM

## 2022-07-14 NOTE — Progress Notes (Signed)
  Subjective:  Patient ID: Daniel Woodard, male    DOB: 04/29/1930,  MRN: [id number]  Chief Complaint  Patient presents with   Nail Problem    Nail trim    89 y.o. male returns for the above complaint.  Patient presents with thickened elongated dystrophic mycotic toenails x 10 mild pain on palpation.  Patient would like me to debride down is not able to do it himself.  He has secondary complaint of discoloration/dependent rubor to both lower extremity left greater than right side.  Patient states he is having some claudication type of pain.  He has not seen MRIs prior to seeing me.  He has not had any intervention done for this.  Objective:  There were no vitals filed for this visit. Podiatric Exam: Vascular: dorsalis pedis and posterior tibial pulses are nonpalpable bilateral. Capillary return is sluggish temperature gradient is WNL. Skin turgor WNL  Sensorium: Normal Semmes Weinstein monofilament test. Normal tactile sensation bilaterally. Nail Exam: Pt has thick disfigured discolored nails with subungual debris noted bilateral entire nail hallux through fifth toenails.  Pain on palpation to the nails. Ulcer Exam: There is no evidence of ulcer or pre-ulcerative changes or infection. Orthopedic Exam: Muscle tone and strength are WNL. No limitations in general ROM. No crepitus or effusions noted.  Skin: No Porokeratosis. No infection or ulcers    Assessment & Plan:   1. Dependent rubor   2. Pain due to onychomycosis of toenails of both feet     Patient was evaluated and treated and all questions answered.  Dependent rubor with underlying peripheral vascular disease -I ordered the patient ABIs PVRs to assess the blood flow to the lower extremity.  If there is decreasing blood flow patient would benefit from vascular intervention.  I discussed with patient he states understanding.  Onychomycosis with pain  -Nails palliatively debrided as below. -Educated on self-care  Procedure:  Nail Debridement Rationale: pain  Type of Debridement: manual, sharp debridement. Instrumentation: Nail nipper, rotary burr. Number of Nails: 10  Procedures and Treatment: Consent by patient was obtained for treatment procedures. The patient understood the discussion of treatment and procedures well. All questions were answered thoroughly reviewed. Debridement of mycotic and hypertrophic toenails, 1 through 5 bilateral and clearing of subungual debris. No ulceration, no infection noted.  Return Visit-Office Procedure: Patient instructed to return to the office for a follow up visit 3 months for continued evaluation and treatment.  Boneta Lucks, DPM    Return in about 3 months (around 10/12/2022).

## 2022-07-20 ENCOUNTER — Encounter: Payer: Self-pay | Admitting: Podiatry

## 2022-07-21 ENCOUNTER — Other Ambulatory Visit: Payer: Self-pay | Admitting: Podiatry

## 2022-07-21 DIAGNOSIS — L539 Erythematous condition, unspecified: Secondary | ICD-10-CM

## 2022-07-25 ENCOUNTER — Ambulatory Visit (HOSPITAL_COMMUNITY)
Admission: RE | Admit: 2022-07-25 | Discharge: 2022-07-25 | Disposition: A | Discharge status: Home / Self Care | Payer: Medicare Other | Source: Ambulatory Visit | Attending: Podiatry | Admitting: Podiatry

## 2022-07-25 DIAGNOSIS — L539 Erythematous condition, unspecified: Secondary | ICD-10-CM | POA: Insufficient documentation

## 2022-07-25 DIAGNOSIS — I70203 Unspecified atherosclerosis of native arteries of extremities, bilateral legs: Secondary | ICD-10-CM

## 2022-07-25 LAB — VAS US ABI WITH/WO TBI

## 2022-07-26 ENCOUNTER — Other Ambulatory Visit: Payer: Self-pay | Admitting: Podiatry

## 2022-07-26 DIAGNOSIS — I70219 Atherosclerosis of native arteries of extremities with intermittent claudication, unspecified extremity: Secondary | ICD-10-CM

## 2022-07-26 DIAGNOSIS — I739 Peripheral vascular disease, unspecified: Secondary | ICD-10-CM

## 2022-07-28 ENCOUNTER — Telehealth: Payer: Self-pay | Admitting: Internal Medicine

## 2022-07-28 NOTE — Telephone Encounter (Signed)
Spoke with patients daughter and got him scheduled for 08/02/22 at 8:20

## 2022-07-28 NOTE — Telephone Encounter (Signed)
Ebony Hail from [facility] called and said the patient's family would like to have patient evaluated for hospice care. Best callback for Ebony Hail is [phone])

## 2022-08-02 ENCOUNTER — Telehealth: Payer: Self-pay

## 2022-08-02 ENCOUNTER — Encounter: Payer: Self-pay | Admitting: Internal Medicine

## 2022-08-02 ENCOUNTER — Other Ambulatory Visit: Payer: Self-pay | Admitting: Internal Medicine

## 2022-08-02 ENCOUNTER — Ambulatory Visit: Payer: Medicare Other | Admitting: Internal Medicine

## 2022-08-02 VITALS — BP 132/70 | HR 72 | Temp 97.8°F | Ht 69.0 in | Wt 163.0 lb

## 2022-08-02 DIAGNOSIS — R531 Weakness: Secondary | ICD-10-CM

## 2022-08-02 DIAGNOSIS — E782 Mixed hyperlipidemia: Secondary | ICD-10-CM

## 2022-08-02 DIAGNOSIS — E559 Vitamin D deficiency, unspecified: Secondary | ICD-10-CM | POA: Diagnosis not present

## 2022-08-02 DIAGNOSIS — E538 Deficiency of other specified B group vitamins: Secondary | ICD-10-CM

## 2022-08-02 DIAGNOSIS — I5042 Chronic combined systolic (congestive) and diastolic (congestive) heart failure: Secondary | ICD-10-CM

## 2022-08-02 DIAGNOSIS — Z0001 Encounter for general adult medical examination with abnormal findings: Secondary | ICD-10-CM

## 2022-08-02 DIAGNOSIS — R296 Repeated falls: Secondary | ICD-10-CM | POA: Diagnosis not present

## 2022-08-02 DIAGNOSIS — I10 Essential (primary) hypertension: Secondary | ICD-10-CM

## 2022-08-02 DIAGNOSIS — J449 Chronic obstructive pulmonary disease, unspecified: Secondary | ICD-10-CM

## 2022-08-02 DIAGNOSIS — N1832 Chronic kidney disease, stage 3b: Secondary | ICD-10-CM

## 2022-08-02 DIAGNOSIS — E1165 Type 2 diabetes mellitus with hyperglycemia: Secondary | ICD-10-CM | POA: Diagnosis not present

## 2022-08-02 DIAGNOSIS — F5101 Primary insomnia: Secondary | ICD-10-CM

## 2022-08-02 LAB — URINALYSIS, ROUTINE W REFLEX MICROSCOPIC
Bilirubin Urine: NEGATIVE
Hgb urine dipstick: NEGATIVE
Ketones, ur: NEGATIVE
Leukocytes,Ua: NEGATIVE
Nitrite: NEGATIVE
RBC / HPF: NONE SEEN (ref 0–?)
Specific Gravity, Urine: 1.025 (ref 1.000–1.030)
Total Protein, Urine: 30 — AB
Urine Glucose: >=1000 — AB
Urobilinogen, UA: 0.2 (ref 0.0–1.0)
pH: 5.5 (ref 5.0–8.0)

## 2022-08-02 LAB — LIPID PANEL
Cholesterol: 88 mg/dL (ref 0–200)
HDL: 31.4 mg/dL — ABNORMAL LOW (ref >39.00)
LDL Cholesterol: 38 mg/dL (ref 0–99)
NonHDL: 56.42
Total CHOL/HDL Ratio: 3
Triglycerides: 93 mg/dL (ref 0.0–149.0)
VLDL: 18.6 mg/dL (ref 0.0–40.0)

## 2022-08-02 LAB — HEPATIC FUNCTION PANEL
ALT: 23 U/L (ref 0–53)
AST: 25 U/L (ref 0–37)
Albumin: 3.3 g/dL — ABNORMAL LOW (ref 3.5–5.2)
Alkaline Phosphatase: 126 U/L — ABNORMAL HIGH (ref 39–117)
Bilirubin, Direct: 0.7 mg/dL — ABNORMAL HIGH (ref 0.0–0.3)
Total Bilirubin: 1.5 mg/dL — ABNORMAL HIGH (ref 0.2–1.2)
Total Protein: 6.5 g/dL (ref 6.0–8.3)

## 2022-08-02 LAB — CBC WITH DIFFERENTIAL/PLATELET
Basophils Absolute: 0 10*3/uL (ref 0.0–0.1)
Basophils Relative: 0.5 % (ref 0.0–3.0)
Eosinophils Absolute: 0.1 10*3/uL (ref 0.0–0.7)
Eosinophils Relative: 1.3 % (ref 0.0–5.0)
HCT: 46.5 % (ref 39.0–52.0)
Hemoglobin: 14.7 g/dL (ref 13.0–17.0)
Lymphocytes Relative: 15.4 % (ref 12.0–46.0)
Lymphs Abs: 0.8 10*3/uL (ref 0.7–4.0)
MCHC: 31.5 g/dL (ref 30.0–36.0)
MCV: 78.1 fl (ref 78.0–100.0)
Monocytes Absolute: 0.6 10*3/uL (ref 0.1–1.0)
Monocytes Relative: 11.4 % (ref 3.0–12.0)
Neutro Abs: 3.9 10*3/uL (ref 1.4–7.7)
Neutrophils Relative %: 71.4 % (ref 43.0–77.0)
Platelets: 186 10*3/uL (ref 150.0–400.0)
RBC: 5.96 Mil/uL — ABNORMAL HIGH (ref 4.22–5.81)
RDW: 21.1 % — ABNORMAL HIGH (ref 11.5–15.5)
WBC: 5.5 10*3/uL (ref 4.0–10.5)

## 2022-08-02 LAB — MICROALBUMIN / CREATININE URINE RATIO
Creatinine,U: 126.3 mg/dL
Microalb Creat Ratio: 18.4 mg/g (ref 0.0–30.0)
Microalb, Ur: 23.2 mg/dL — ABNORMAL HIGH (ref 0.0–1.9)

## 2022-08-02 LAB — BASIC METABOLIC PANEL
BUN: 42 mg/dL — ABNORMAL HIGH (ref 6–23)
CO2: 30 mEq/L (ref 19–32)
Calcium: 8.8 mg/dL (ref 8.4–10.5)
Chloride: 96 mEq/L (ref 96–112)
Creatinine, Ser: 1.99 mg/dL — ABNORMAL HIGH (ref 0.40–1.50)
GFR: 29.02 mL/min — ABNORMAL LOW (ref >60.00)
Glucose, Bld: 91 mg/dL (ref 70–99)
Potassium: 4.1 mEq/L (ref 3.5–5.1)
Sodium: 138 mEq/L (ref 135–145)

## 2022-08-02 LAB — HEMOGLOBIN A1C: Hgb A1c MFr Bld: 6.9 % — ABNORMAL HIGH (ref 4.6–6.5)

## 2022-08-02 LAB — TSH: TSH: 10.64 u[IU]/mL — ABNORMAL HIGH (ref 0.35–5.50)

## 2022-08-02 LAB — VITAMIN B12: Vitamin B-12: 830 pg/mL (ref 211–911)

## 2022-08-02 LAB — VITAMIN D 25 HYDROXY (VIT D DEFICIENCY, FRACTURES): VITD: 69.34 ng/mL (ref 30.00–100.00)

## 2022-08-02 MED ORDER — LEVOTHYROXINE SODIUM 50 MCG PO TABS: 50.0000 ug | ORAL_TABLET | Freq: Every day | ORAL | 3 refills | Status: DC

## 2022-08-02 NOTE — Assessment & Plan Note (Signed)
Stable overall, continue inhaler prn

## 2022-08-02 NOTE — Assessment & Plan Note (Signed)
Also for [facility] with PT, RV, aide

## 2022-08-02 NOTE — Assessment & Plan Note (Signed)
BP Readings from Last 3 Encounters:  08/02/22 132/70  07/07/22 135/73  06/02/22 107/77   Stable, pt to continue medical treatment toprol xl 25 - 1/2 qd

## 2022-08-02 NOTE — Assessment & Plan Note (Signed)
With marked debility in last 2 wks resulting in 2 daughter staying in the home to care for him, for labs including cbc

## 2022-08-02 NOTE — Assessment & Plan Note (Signed)
Age and sex appropriate education and counseling updated with regular exercise and diet Referrals for preventative services - none needed Immunizations addressed - declines covid booster, for shingrix at pharmacy Smoking counseling  - none needed Evidence for depression or other mood disorder - none significant Most recent labs reviewed. I have personally reviewed and have noted: 1) the patient's medical and social history 2) The patient's current medications and supplements 3) The patient's height, weight, and BMI have been recorded in the chart

## 2022-08-02 NOTE — Telephone Encounter (Signed)
(  12:45 pm) PC SW scheduled patient's initial palliative care visit with his daughter. Visit is scheduled for 08/04/22 @ 11am.

## 2022-08-02 NOTE — Patient Instructions (Signed)
Please continue all other medications as before, and refills have been done if requested.  Please have the pharmacy call with any other refills you may need.  Please continue your efforts at being more active, low cholesterol diet, and weight control.  You are otherwise up to date with prevention measures today.  Please keep your appointments with your specialists as you may have planned  You will be contacted regarding the referral for: [place] with RN, PT and aide  Please go to the LAB at the blood drawing area for the tests to be done  You will be contacted by phone if any changes need to be made immediately.  Otherwise, you will receive a letter about your results with an explanation, but please check with MyChart first.  Please remember to sign up for MyChart if you have not done so, as this will be important to you in the future with finding out test results, communicating by private email, and scheduling acute appointments online when needed.  Please make an Appointment to return in 4 months, or sooner if needed

## 2022-08-02 NOTE — Assessment & Plan Note (Addendum)
Overall volume stable, cont current med tx lasix 40 qd, cardiology f/u, and referral palliative care given recently debility change for unclear reason

## 2022-08-02 NOTE — Assessment & Plan Note (Signed)
Lab Results  Component Value Date   LDLCALC 66 04/08/2022   Stable, pt to continue current statin crestor 20 mg qd

## 2022-08-02 NOTE — Assessment & Plan Note (Signed)
Lab Results  Component Value Date   HGBA1C 7.0 (H) 04/08/2022   Stable, pt to continue current medical treatment jardiance 10 mg qd

## 2022-08-02 NOTE — Progress Notes (Signed)
Patient ID: Daniel Woodard, male   DOB: May 28, 1930, 89 y.o.   MRN: [id number]         Chief Complaint:: wellness exam and generalized weakness, recurrent falls, fatigue,        HPI:  Daniel Woodard is a 89 y.o. male here for wellness exam; declines covid booster, for shingrix at pharmacy o/w up to date                        Also has been in bed for 2 wks with generalized weakness, recurrent falls without injury x 2, fatigue.  Unable to now walk well or get in car without assist   Denies worsening depressive symptoms, suicidal ideation, or panic; No recent overt bleeding or bruising.  Pt denies chest pain, increased sob or doe, wheezing, orthopnea, PND, increased LE swelling, palpitations, dizziness or syncope.   Pt denies polydipsia, polyuria, or new focal neuro s/s.  Denies urinary symptoms such as dysuria, frequency, urgency, flank pain, hematuria or n/v, fever, chills.  No cough or ST.     Wt Readings from Last 3 Encounters:  08/02/22 163 lb (73.9 kg)  07/07/22 165 lb (74.8 kg)  06/02/22 168 lb (76.2 kg)   BP Readings from Last 3 Encounters:  08/02/22 132/70  07/07/22 135/73  06/02/22 107/77   Immunization History  Administered Date(s) Administered   Fluad Quad(high Dose 65+) 02/27/2019   H1N1 05/05/2008   Influenza Split 05/19/2011, 03/06/2012   Influenza Whole 03/30/2000, 03/17/2008, 04/08/2009, 03/17/2010   Influenza, High Dose Seasonal PF 02/03/2015, 02/10/2016, 03/10/2017   Influenza,inj,Quad PF,6+ Mos 03/07/2013, 02/13/2014   Influenza-Unspecified 03/08/2022   PFIZER(Purple Top)SARS-COV-2 Vaccination 06/13/2019, 07/03/2019, 03/03/2020, 09/08/2020, 03/10/2021   Pneumococcal Conjugate-13 05/21/2013   Pneumococcal Polysaccharide-23 03/30/2005, 05/13/2010   Td 07/29/1998, 04/08/2009   Tdap 11/22/2018   Zoster, Live 04/07/2008  There are no preventive care reminders to display for this patient.    Past Medical History:  Diagnosis Date   ALLERGIC RHINITIS 04/09/2007    Atrial fibrillation ([place])    BACTEREMIA, 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   BENIGN PROSTATIC HYPERTROPHY 04/09/2007   Blood transfusion without reported diagnosis    really not sure thinks 30-40 years ago   BRADYCARDIA 05/11/2009   Carotid artery occlusion    COLONIC POLYPS, HX OF 04/09/2007   ADENOMATOUS POLYPS 2004,2008   Complete heart block (HCC)    COPD 04/09/2007   Cough 03/27/2008   DEPRESSION 04/09/2007   DIABETES MELLITUS, TYPE II 10/09/2007   DIVERTICULOSIS, COLON 04/09/2007   DIZZINESS 10/06/2008   GLUCOSE INTOLERANCE 04/09/2007   HYPERLIPIDEMIA 03/27/2007   HYPERTENSION 03/27/2007   Kidney stone    PERIPHERAL VASCULAR DISEASE 03/27/2007   PNEUMONIA ORGANISM NOS 11/08/2007   SHINGLES, HX OF 04/09/2007   SHOULDER PAIN, BILATERAL 08/01/2007   Ulcer    40 years ago   Past Surgical History:  Procedure Laterality Date   APPENDECTOMY     89 years old   BIV UPGRADE N/A 08/14/2020   Procedure: BIV PPM UPGRADE;  Surgeon: Evans Lance, MD;  Location: [facility];  Service: Cardiovascular;  Laterality: N/A;   CAROTID ENDARTERECTOMY  5/08   left   CARPAL TUNNEL RELEASE     bilateral   COLONOSCOPY     ESOPHAGOGASTRODUODENOSCOPY  06/07/2004   HEMORRHOID BANDING  1990s   INCISION AND DRAINAGE ABSCESS N/A 06/08/2021   Procedure: INCISION AND DRAINAGE ABSCESS;  Surgeon: Mickeal Skinner, MD;  Location: [facility];  Service: General;  Laterality: N/A;   LEFT HEART CATH AND CORONARY ANGIOGRAPHY N/A 06/02/2020   Procedure: LEFT HEART CATH AND CORONARY ANGIOGRAPHY;  Surgeon: Jettie Booze, MD;  Location: [facility];  Service: Cardiovascular;  Laterality: N/A;   PACEMAKER INSERTION  07/11/2013   MDT ADDRL1 pacemaker implanted by Dr Lovena Le for complete heart block   PERMANENT PACEMAKER INSERTION N/A 07/11/2013   Procedure: PERMANENT PACEMAKER INSERTION;  Surgeon: Evans Lance, MD;  Location: [facility];  Service: Cardiovascular;  Laterality: N/A;   s/p  PUD surgury     ? partial gastrectomy    reports that he quit smoking about 22 years ago. His smoking use included cigarettes. He has a 50.00 pack-year smoking history. He has never used smokeless tobacco. He reports that he does not drink alcohol and does not use drugs. family history includes Heart disease in his sister and another family member; Hypertension in his father, mother, sister, sister, sister, and sister; Lung cancer in his sister; Peripheral vascular disease in his brother; Stroke in an other family member; Ulcers in his father and sister. Allergies  Allergen Reactions   Ace Inhibitors Cough        Current Outpatient Medications on File Prior to Visit  Medication Sig Dispense Refill   Coenzyme Q10 200 MG capsule Take by mouth.     empagliflozin (JARDIANCE) 10 MG TABS tablet Take 1 tablet (10 mg total) by mouth daily before breakfast. 14 tablet 0   furosemide (LASIX) 40 MG tablet Take 1 tablet (40 mg total) by mouth daily. 90 tablet 3   metoprolol succinate (TOPROL-XL) 25 MG 24 hr tablet Take 1/2 (one-half) tablet by mouth once daily 45 tablet 0   omeprazole (PRILOSEC) 20 MG capsule Take 1 capsule by mouth once daily 90 capsule 3   QUERCETIN PO Take 3 tablets by mouth 2 (two) times daily. With Vitamin C     rosuvastatin (CRESTOR) 20 MG tablet Take 1 tablet (20 mg total) by mouth at bedtime. 30 tablet 0   VITAMIN D PO Take 5,000 Units by mouth daily.     vitamin k 100 MCG tablet Take 100 mcg by mouth daily.     zinc gluconate 50 MG tablet Take 50 mg by mouth 2 (two) times daily.     albuterol (VENTOLIN HFA) 108 (90 Base) MCG/ACT inhaler Inhale 2 puffs into the lungs every 6 (six) hours as needed for wheezing or shortness of breath. (Patient not taking: Reported on 06/02/2022) 8 g 11   ASPIRIN 81 PO Take 81 mg by mouth daily. (Patient not taking: Reported on 06/02/2022)     Magnesium 400 MG TABS Take 400 mg by mouth daily. (Patient not taking: Reported on 08/02/2022)     psyllium  (METAMUCIL) 58.6 % packet Take 1 packet by mouth as needed. (Patient not taking: Reported on 07/07/2022)     traZODone (DESYREL) 50 MG tablet Take 1 tablet (50 mg total) by mouth at bedtime. (Patient not taking: Reported on 07/07/2022) 90 tablet 1   No current facility-administered medications on file prior to visit.        ROS:  All others reviewed and negative.  Objective        PE:  BP 132/70 (BP Location: Right Arm, Patient Position: Sitting, Cuff Size: Normal)   Pulse 72   Temp 97.8 F (36.6 C) (Oral)   Ht '5\' 9"'$  (1.753 m)   Wt 163 lb (73.9 kg)   SpO2 94%  BMI 24.07 kg/m                 Constitutional: Pt appears in NAD               HENT: Head: NCAT.                Right Ear: External ear normal.                 Left Ear: External ear normal.                Eyes: . Pupils are equal, round, and reactive to light. Conjunctivae and EOM are normal               Nose: without d/c or deformity               Neck: Neck supple. Gross normal ROM               Cardiovascular: Normal rate and regular rhythm.                 Pulmonary/Chest: Effort normal and breath sounds without rales or wheezing.                Abd:  Soft, NT, ND, + BS, no organomegaly               Neurological: Pt is alert. At baseline orientation, motor grossly intact               Skin: Skin is warm. No rashes, no other new lesions, LE edema - none               Psychiatric: Pt behavior is normal without agitation   Micro: none  Cardiac tracings I have personally interpreted today:  none  Pertinent Radiological findings (summarize): none   Lab Results  Component Value Date   WBC 6.7 04/08/2022   HGB 14.5 04/08/2022   HCT 46.2 04/08/2022   PLT 203.0 04/08/2022   GLUCOSE 113 (H) 06/30/2022   CHOL 125 04/08/2022   TRIG 103.0 04/08/2022   HDL 37.80 (L) 04/08/2022   LDLDIRECT 189.9 10/02/2007   LDLCALC 66 04/08/2022   ALT 25 04/08/2022   AST 26 04/08/2022   NA 138 06/30/2022   K 5.1 06/30/2022   CL 99  06/30/2022   CREATININE 1.83 (H) 06/30/2022   BUN 44 (H) 06/30/2022   CO2 23 06/30/2022   TSH 6.97 (H) 04/08/2022   PSA 0.92 11/20/2019   INR 1.1 03/06/2019   HGBA1C 7.0 (H) 04/08/2022   MICROALBUR 46.6 (H) 04/08/2022   Assessment/Plan:  Daniel Woodard is a 89 y.o. White or Caucasian [1] male with  has a past medical history of ALLERGIC RHINITIS (04/09/2007), Atrial fibrillation ([place]), BACTEREMIA, MYCOBACTERIUM AVIUM COMPLEX (04/09/2007), BENIGN PROSTATIC HYPERTROPHY (04/09/2007), Blood transfusion without reported diagnosis, BRADYCARDIA (05/11/2009), Carotid artery occlusion, COLONIC POLYPS, HX OF (04/09/2007), Complete heart block ([place]), COPD (04/09/2007), Cough (03/27/2008), DEPRESSION (04/09/2007), DIABETES MELLITUS, TYPE II (10/09/2007), DIVERTICULOSIS, COLON (04/09/2007), DIZZINESS (10/06/2008), GLUCOSE INTOLERANCE (04/09/2007), HYPERLIPIDEMIA (03/27/2007), HYPERTENSION (03/27/2007), Kidney stone, PERIPHERAL VASCULAR DISEASE (03/27/2007), PNEUMONIA ORGANISM NOS (11/08/2007), SHINGLES, HX OF (04/09/2007), SHOULDER PAIN, BILATERAL (08/01/2007), and Ulcer.  CHF (congestive heart failure) (HCC) Overall volume stable, cont current med tx lasix 40 qd, cardiology f/u, and referral palliative care given recently debility change for unclear reason  COPD GOLD II / AB component  Stable overall, continue inhaler prn  Diabetes ([place]) Lab Results  Component Value Date   HGBA1C  7.0 (H) 04/08/2022   Stable, pt to continue current medical treatment jardiance 10 mg qd   Essential hypertension BP Readings from Last 3 Encounters:  08/02/22 132/70  07/07/22 135/73  06/02/22 107/77   Stable, pt to continue medical treatment toprol xl 25 - 1/2 qd   Hyperlipidemia Lab Results  Component Value Date   LDLCALC 66 04/08/2022   Stable, pt to continue current statin crestor 20 mg qd   Vitamin D deficiency Last vitamin D Lab Results  Component Value Date   VD25OH 82.81 04/08/2022   Stable,  cont oral replacement   Encounter for well adult exam with abnormal findings Age and sex appropriate education and counseling updated with regular exercise and diet Referrals for preventative services - none needed Immunizations addressed - declines covid booster, for shingrix at pharmacy Smoking counseling  - none needed Evidence for depression or other mood disorder - none significant Most recent labs reviewed. I have personally reviewed and have noted: 1) the patient's medical and social history 2) The patient's current medications and supplements 3) The patient's height, weight, and BMI have been recorded in the chart   Generalized weakness With marked debility in last 2 wks resulting in 2 daughter staying in the home to care for him, for labs including cbc  Recurrent falls Also for [facility] with PT, RV, aide  Followup: Return in about 4 months (around 12/02/2022).  Cathlean Cower, MD 08/02/2022 1:09 PM [facility]

## 2022-08-02 NOTE — Assessment & Plan Note (Signed)
Last vitamin D Lab Results  Component Value Date   VD25OH 82.81 04/08/2022   Stable, cont oral replacement

## 2022-08-04 ENCOUNTER — Other Ambulatory Visit: Payer: Medicare Other

## 2022-08-04 VITALS — BP 100/68 | HR 79 | Temp 96.8°F

## 2022-08-04 DIAGNOSIS — Z515 Encounter for palliative care: Secondary | ICD-10-CM

## 2022-08-04 NOTE — Progress Notes (Signed)
COMMUNITY PALLIATIVE CARE SW NOTE  PATIENT NAME: Daniel Woodard DOB: 1929-07-03 MRN: [id number]  PRIMARY CARE PROVIDER: Biagio Borg, MD  RESPONSIBLE PARTY:  Acct ID - Guarantor Home Phone Work Phone Relationship Acct Type  [id number] JO, [phone]  Self P/F     [address] [place], [place], [place] [address]   Initial Palliative Care Visit/Clinical Social Work  SW and Nurse-D. Georgann Housekeeper completed an initial visit with patient at his home. His daughter was present for this visit. The team provided education regarding palliative care services, role in patient's care, and visit frequency.  Patient had a fall on Tuesday and hit is arm on his dresser and caused in bruise to his left arm. The nurse dressed that bruise. Patient report increased weakness and fatigue. Patient report that he is short of breath all the time. Patient's feet, fingers and nail bed are purplish in color. Patient is having intermittent confusion. Patient has a congested cough. Patient has cracked, red heels.  Patient report that his appetite is good as he will eat two good meals a day, with snacks and he drinks 2 bottles of water a day. Taking in about 6 bottles.  Patient was born and raised in [place], [place]. Patient serviced two years in the [employer] and was stationed in [place] for 2 years.  He is widowed for three years and have two daughters Daniel Woodard and Daniel Woodard). 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  Patient's daughters are his POA/HCPOA Patient desires a DNR, 2 copies left in the home.   Goal: Patient to be moved to an assisted living; to be able to breathe better and get more strength.    Social History   Tobacco Use   Smoking status: Former    Packs/day: 1.00    Years: 50.00    Total pack years: 50.00    Types: Cigarettes    Quit date: 08/20/1999    Years since quitting: 22.9   Smokeless tobacco: Never  Substance Use Topics   Alcohol use: No    Alcohol/week: 0.0 standard  drinks of alcohol    CODE STATUS: DNR, copies left in the home ADVANCED DIRECTIVES: No MOST FORM COMPLETE: No HOSPICE EDUCATION PROVIDED: Yes  Duration of visit and documentation: 60 minutes  Next Visit: Telephonic in 4 weeks   [employer], LCSW

## 2022-08-04 NOTE — Progress Notes (Signed)
PATIENT NAME: Daniel Woodard DOB: 07-Apr-1930 MRN: [id number]  PRIMARY CARE PROVIDER: Biagio Borg, MD  RESPONSIBLE PARTY:  Acct ID - Guarantor Home Phone Work Phone Relationship Acct Type  [id number] REN, [phone]  Self P/F     [address] [place], [place], [place] [address]   Palliative Care Initial Encounter Note   Completed home visit w/Monica Lonon, SW. Daughter Helene Kelp and Pam (at the end) also present.       HISTORY OF PRESENT ILLNESS:    Respiratory: frequently SOB; bilat congestion; expiratory wheezing   Cardiac: SOB; night time confusion; blue purple nail beds; >5sec blanching  Cognitive: occasional sundowning; alert and oriented but not to time   Appetite: 2 meals with snacks; drinks approx 6 cups per day  GI/GU: BM qod; no urinary issues   Mobility: blanchable skin on heels but somewhat boggy on R heel; multiple falls; last fall on Sunday 3.3.24  ADLs: dependent for ADLs   Sleeping Pattern: sleeps well awakens to potty; other times he   Pain: denies pain at this time  Skin; L forearm skin tear from Sunday's fall   Palliative Care/ Hospice: RN explained role and purpose of palliative can Sundayre including visit frequency. Also discussed benefits of hospice care as well as the differences between the two with patient.   Goals of Care: To go to a facility   CODE STATUS: DNR ADVANCED DIRECTIVES: N MOST FORM: N PPS: 30%   PHYSICAL EXAM:   VITALS: Today's Vitals   08/04/22 1133  BP: 100/68  Pulse: 79  Temp: (!) 96.8 F (36 C)  SpO2: 98%    LUNGS: decreased breath sounds, expiratory wheezes bilaterally CARDIAC: Cor RRR EXTREMITIES: cool; red with lots of veinf on lower extremities SKIN: no edema, bilateral foot discoloration NEURO: negative      Anila Bojarski Georgann Housekeeper, LPN

## 2022-08-05 ENCOUNTER — Ambulatory Visit
Admission: RE | Admit: 2022-08-05 | Discharge: 2022-08-05 | Disposition: A | Discharge status: Home / Self Care | Payer: Medicare Other | Source: Ambulatory Visit | Attending: Podiatry | Admitting: Podiatry

## 2022-08-05 DIAGNOSIS — I70219 Atherosclerosis of native arteries of extremities with intermittent claudication, unspecified extremity: Secondary | ICD-10-CM

## 2022-08-05 DIAGNOSIS — I739 Peripheral vascular disease, unspecified: Secondary | ICD-10-CM

## 2022-08-05 HISTORY — PX: IR RADIOLOGIST EVAL & MGMT: IMG5224

## 2022-08-05 NOTE — Consult Note (Signed)
Chief Complaint: Foot pain  Referring Physician(s): Patel,Kevin P  Cardiology: Dr. Dani Gobble Croitoru, Dr. Cristopher Peru PCP: Dr. Cathlean Cower  History of Present Illness: Daniel Woodard is a 89 y.o. male presenting to [facility] today, kindly referred by Dr. Posey Pronto of [facility], for evaluation of foot pain, PAD, and candidacy for possible revascularization.   Daniel Woodard is here in the clinic today with his daughter, Daniel Woodard.    They tell me that their primary concern is the development of bilateral lower extremity color changes, with new redness/purplish color, starting first on the right but now both lower extremities.  This started sometime after Christmas of 2023, within the last 2 months.    He has also developed a superficial wound on the right heel, for which he is getting wound care.    Daniel Woodard tells me that his heel only hurts occasionally.  He is not walking much now during the day.  He tells me that last fall in 2023 he would walk around [place] with a cart, and he denied any leg pain.  I cannot elicit any history of resting pain or night pain.    His decreased ambulating is associated with more fatigue over the past 2 months.  He denies pain as the source.  His appetite is good.  He denies SOB as the cause.  He says, "I just dont feel motivated."   His last hospitalization was for a peri-rectal abscess and surgery January 2023.  He has not had any recent hospitalization.   Non-invasive exam/ABI was performed 07/25/22: Right ABI: 1.78, falsely elevated Left ABI: 1.78, falsely elevated.   Monophasic doppler waveforms bilateral.   Cardiovascular risk factors include: Smoking (50 yrs, quit 22 yrs ago), diabetes, HTN, HLD, known CVD, known CAD, prior known PAD, family history.    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   BENIGN PROSTATIC HYPERTROPHY 04/09/2007   Blood transfusion without reported diagnosis    really not sure thinks 30-40 years ago   BRADYCARDIA 05/11/2009   Carotid artery occlusion    COLONIC POLYPS, HX OF 04/09/2007   ADENOMATOUS POLYPS 2004,2008   Complete heart block ([facility])    COPD 04/09/2007   Cough 03/27/2008   DEPRESSION 04/09/2007   DIABETES MELLITUS, TYPE II 10/09/2007   DIVERTICULOSIS, COLON 04/09/2007   DIZZINESS 10/06/2008   GLUCOSE INTOLERANCE 04/09/2007   HYPERLIPIDEMIA 03/27/2007   HYPERTENSION 03/27/2007   Kidney stone    PERIPHERAL VASCULAR DISEASE 03/27/2007   PNEUMONIA ORGANISM NOS 11/08/2007   SHINGLES, HX OF 04/09/2007   SHOULDER PAIN, BILATERAL 08/01/2007   Ulcer    40 years ago    Past Surgical History:  Procedure Laterality Date   APPENDECTOMY     89 years old   BIV UPGRADE N/A 08/14/2020   Procedure: BIV PPM UPGRADE;  Surgeon: Evans Lance, MD;  Location: [facility];  Service: Cardiovascular;  Laterality: N/A;   CAROTID ENDARTERECTOMY  5/08   left   CARPAL TUNNEL RELEASE     bilateral   COLONOSCOPY     ESOPHAGOGASTRODUODENOSCOPY  06/07/2004   HEMORRHOID BANDING  [place] N/A 06/08/2021   Procedure: INCISION AND DRAINAGE ABSCESS;  Surgeon: Kieth Brightly, Arta Bruce, MD;  Location: [facility];  Service: General;  Laterality: N/A;   LEFT HEART CATH AND CORONARY ANGIOGRAPHY N/A 06/02/2020   Procedure: LEFT HEART CATH AND CORONARY ANGIOGRAPHY;  Surgeon: Jettie Booze, MD;  Location: [facility];  Service: Cardiovascular;  Laterality: N/A;   PACEMAKER INSERTION  07/11/2013   MDT ADDRL1 pacemaker implanted by Dr Lovena Le for complete heart block    PERMANENT PACEMAKER INSERTION N/A 07/11/2013   Procedure: PERMANENT PACEMAKER INSERTION;  Surgeon: Evans Lance, MD;  Location: [facility];  Service: Cardiovascular;  Laterality: N/A;   s/p PUD surgury     ? partial gastrectomy    Allergies: Ace inhibitors  Medications: Prior to Admission medications   Medication Sig Start Date End Date Taking? Authorizing Provider  albuterol (VENTOLIN HFA) 108 (90 Base) MCG/ACT inhaler Inhale 2 puffs into the lungs every 6 (six) hours as needed for wheezing or shortness of breath. Patient not taking: Reported on 06/02/2022 04/08/22   Biagio Borg, MD  ASPIRIN 81 PO Take 81 mg by mouth daily. Patient not taking: Reported on 06/02/2022    [provider]  Coenzyme Q10 200 MG capsule Take by mouth. 07/17/15   [provider]  empagliflozin (JARDIANCE) 10 MG TABS tablet Take 1 tablet (10 mg total) by mouth daily before breakfast. 06/23/22   Croitoru, Mihai, MD  furosemide (LASIX) 40 MG tablet Take 1 tablet (40 mg total) by mouth daily. 12/27/21   Evans Lance, MD  levothyroxine (SYNTHROID) 50 MCG tablet Take 1 tablet (50 mcg total) by mouth daily. 08/02/22   Biagio Borg, MD  Magnesium 400 MG TABS Take 400 mg by mouth daily. Patient not taking: Reported on 08/02/2022    [provider]  metoprolol succinate (TOPROL-XL) 25 MG 24 hr tablet Take 1/2 (one-half) tablet by mouth once daily 06/03/22   Biagio Borg, MD  omeprazole (PRILOSEC) 20 MG capsule Take 1 capsule by mouth once daily 11/19/21   Biagio Borg, MD  psyllium (METAMUCIL) 58.6 % packet Take 1 packet by mouth as needed. Patient not taking: Reported on 07/07/2022    [provider]  QUERCETIN PO Take 3 tablets by mouth 2 (two) times daily. With Vitamin C    [provider]  rosuvastatin (CRESTOR) 20 MG tablet Take 1 tablet (20 mg total) by mouth at bedtime. 06/10/21   Swayze, Ava, DO  traZODone (DESYREL) 50 MG tablet Take 1 tablet (50 mg total) by mouth at  bedtime. Patient not taking: Reported on 07/07/2022 04/08/22   Biagio Borg, MD  VITAMIN D PO Take 5,000 Units by mouth daily.    [provider]  vitamin k 100 MCG tablet Take 100 mcg by mouth daily.    [provider]  zinc gluconate 50 MG tablet Take 50 mg by mouth 2 (two) times daily.    [provider]     Family History  Problem Relation Age of Onset   Hypertension Father    Ulcers Father    Hypertension Mother    Hypertension Sister    Peripheral vascular disease Brother    Hypertension Sister    Ulcers Sister        Ulcers   Hypertension Sister    Lung cancer Sister        smoked   Heart disease Sister  Carotid    Hypertension Sister    Heart disease Other        Cardiovascular disorder, CHF   Stroke Other        1st degree relative male and male   Colon cancer Neg Hx     Social History   Socioeconomic History   Marital status: Widowed    Spouse name: Not on file   Number of children: 2   Years of education: Not on file   Highest education level: Not on file  Occupational History   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  Tobacco Use   Smoking status: Former    Packs/day: 1.00    Years: 50.00    Total pack years: 50.00    Types: Cigarettes    Quit date: 08/20/1999    Years since quitting: 22.9   Smokeless tobacco: Never  Vaping Use   Vaping Use: Never used  Substance and Sexual Activity   Alcohol use: No    Alcohol/week: 0.0 standard drinks of alcohol   Drug use: No   Sexual activity: Never  Other Topics Concern   Not on file  Social History Narrative   Widowed; wife dies 2018/08/19   Daily caffeine    Social Determinants of Health   Financial Resource Strain: Low Risk  (02/25/2022)   Overall Financial Resource Strain (CARDIA)    Difficulty of Paying Living Expenses: Not hard at all  Food Insecurity: No Food Insecurity (02/25/2022)   Hunger Vital Sign    Worried About Running  Out of Food in the Last Year: Never true    Ran Out of Food in the Last Year: Never true  Transportation Needs: No Transportation Needs (02/25/2022)   PRAPARE - Hydrologist (Medical): No    Lack of Transportation (Non-Medical): No  Physical Activity: Inactive (02/25/2022)   Exercise Vital Sign    Days of Exercise per Week: 0 days    Minutes of Exercise per Session: 0 min  Stress: No Stress Concern Present (02/25/2022)   [place]    Feeling of Stress : Not at all  Social Connections: Unknown (02/25/2022)   Social Connection and Isolation Panel [NHANES]    Frequency of Communication with Friends and Family: More than three times a week    Frequency of Social Gatherings with Friends and Family: More than three times a week    Attends Religious Services: Never    Marine scientist or Organizations: No    Attends Archivist Meetings: Never    Marital Status: Patient refused       Review of Systems: A 12 point ROS discussed and pertinent positives are indicated in the HPI above.  All other systems are negative.  Review of Systems  Vital Signs: There were no vitals taken for this visit.  Advance Care Plan: The advanced care plan/surrogate decision maker was discussed at the time of visit and documented in the medical record.    Physical Exam General: 89 yo male appearing stated age.  Well-developed, well-nourished.  No distress. HEENT: Atraumatic, normocephalic.  Glasses.  Conjugate gaze, extra-ocular motor intact. No scleral icterus or scleral injection. No lesions on external ears, nose, lips, or gums.  Oral mucosa moist, pink.  Neck: Symmetric with no goiter enlargement.  Chest/Lungs:  Symmetric chest with inspiration/expiration.  No labored breathing.   Heart:   No JVD appreciated.  Abdomen:  Soft, NT/ND, with + bowel sounds.   Genito-urinary: Deferred Neurologic: Alert  & Oriented to person, place, and time.   Normal affect and insight.  Appropriate questions.  Moving all 4 extremities Pulse Exam:   + doppler of the right and left DP and PT.  Extremities: superficial ulcer/partial thickness of the right posterior heel.  No drainage. No intertriginous wound.  Rubor of the bilateral lower legs and feet.  Telangiectasia of the bilateral feet.  No right edema. Mild non-pitting edema at the left ankle.  Trophic changes of the bilateral legs including absent hair, nail changes, and flaking dried skin.  .       Imaging: VAS [place] ABI WITH/WO TBI  Result Date: 07/25/2022  LOWER EXTREMITY DOPPLER STUDY Patient Name:  Daniel Woodard  Date of Exam:   07/25/2022 Medical Rec #: [id number]        Accession #:    [id number] Date of Birth: 06-Feb-1930        Patient Gender: M Patient Age:   21 years Exam Location:  [facility] Procedure:      VAS [place] ABI WITH/WO TBI Referring Phys: Lennette Bihari PATEL --------------------------------------------------------------------------------  Indications: [facility] MD note 10/12/22: discoloration/dependent rubor              to both lower extremity left greater than right side. Patient              states he is having some claudication type of pain. High Risk         Hypertension, hyperlipidemia, Diabetes, past history of Factors:          smoking. Other Factors: Known left subclavian artery occlusion.  Performing Technologist: Ralene Cork RVT  Examination Guidelines: A complete evaluation includes at minimum, Doppler waveform signals and systolic blood pressure reading at the level of bilateral brachial, anterior tibial, and posterior tibial arteries, when vessel segments are accessible. Bilateral testing is considered an integral part of a complete examination. Photoelectric Plethysmograph (PPG) waveforms and toe systolic pressure readings are included as required and additional duplex testing as needed. Limited examinations for  reoccurring indications may be performed as noted.  ABI Findings: +---------+------------------+-----+----------+--------+ Right    Rt Pressure (mmHg)IndexWaveform  Comment  +---------+------------------+-----+----------+--------+ Brachial 143                                       +---------+------------------+-----+----------+--------+ PTA      167               1.17 monophasic         +---------+------------------+-----+----------+--------+ DP       255               1.78 monophasic         +---------+------------------+-----+----------+--------+ Great Toe93                0.65                    +---------+------------------+-----+----------+--------+ +---------+------------------+-----+----------+-------+ Left     Lt Pressure (mmHg)IndexWaveform  Comment +---------+------------------+-----+----------+-------+ Brachial 117                                      +---------+------------------+-----+----------+-------+ PTA      255  1.78 monophasic        +---------+------------------+-----+----------+-------+ DP       255               1.78 monophasic        +---------+------------------+-----+----------+-------+ Great Toe100               0.70                   +---------+------------------+-----+----------+-------+ +-------+-----------+-----------+------------+------------+ ABI/TBIToday's ABIToday's TBIPrevious ABIPrevious TBI +-------+-----------+-----------+------------+------------+ Right  Daniel Woodard         0.65                                +-------+-----------+-----------+------------+------------+ Left            0.7                                 +-------+-----------+-----------+------------+------------+  Arterial wall calcification precludes accurate ankle pressures and ABIs. No previous ABI.  Summary: Right: Resting right ankle-brachial index indicates noncompressible right lower extremity arteries. The right  toe-brachial index is normal. Abnormal waveforms. Left: Resting left ankle-brachial index indicates noncompressible left lower extremity arteries. The left toe-brachial index is normal. Abnormal waveforms. *See table(s) above for measurements and observations.  Electronically signed by Harold Barban MD on 07/25/2022 at 3:35:47 PM.    Final     Labs:  CBC: Recent Labs    04/08/22 1143 08/02/22 0922  WBC 6.7 5.5  HGB 14.5 14.7  HCT 46.2 46.5  PLT 203.0 186.0    COAGS: No results for input(s): "INR", "APTT" in the last 8760 hours.  BMP: Recent Labs    09/21/21 1135 04/08/22 1143 06/30/22 1120 08/02/22 0922  NA 136 140 138 138  K 4.5 4.9 5.1 4.1  CL 101 104 99 96  CO2 '28 30 23 30  '$ GLUCOSE 111* 102* 113* 91  BUN 36* 37* 44* 42*  CALCIUM 9.2 9.4 9.1 8.8  CREATININE 1.76* 1.81* 1.83* 1.99*    LIVER FUNCTION TESTS: Recent Labs    09/21/21 1135 04/08/22 1143 08/02/22 0922  BILITOT 1.3* 1.4* 1.5*  AST '23 26 25  '$ ALT '19 25 23  '$ ALKPHOS 86 94 126*  PROT 7.7 7.7 6.5  ALBUMIN 4.4 4.4 3.3*    TUMOR MARKERS: No results for input(s): "AFPTM", "CEA", "CA199", "CHROMGRNA" in the last 8760 hours.  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   Non-invasive lower extremity exam shows non-compressible ABI/elevated ABI, however, doppler shows monophasic waveform.  I suspect he has multi-level disease based on review of prior abd/pelvis CT and his DM history  His venous disease CEAP phenotype is right = C - 2, and the left = C - 2/C - 3.    He does have a superficial wound on the right heel, technically may be classified Rutherford class 5.   I had a lengthy discussion with Daniel Woodard and his daughter regarding anatomy, pathology/pathophysiology, natural history, and prognosis of PAD/CLI.  Informed consent regarding treatment strategies was  performed which  includes medical management, as well as possible surgical strategy, and/or endovascular options, with risk/benefit discussion.  The indications for treatment supported by updated guidelines1, 2 were discussed.  I had also a discussion with them  regarding the general manifestation of venous disease, which includes the telangiectasias and some of the skin changes that he is experiencing.  We reviewed the role of the calf-muscle pump/exercise with venous return.    I did let them know that I believe his symptoms are mixed venous disease and arterial disease.  He has co-morbid conditions including aortic stenosis, CHF history, PAD, and is currently not exercising or even walking very much, and so his symptoms are a product of the compromised arterial flow and the venous stasis.   I did briefly discuss with them the concepts of angiogram and possible intervention with endovascular strategy, simply so they understand that beyond medical care and increasing his exercise/activity level, the only solution would be intervention/surgery.  I made sure to review with him, his risk estimate from the NSQUIP calculator.   Regarding medical management or arterial disease, we reviewed the medical therapy for reduction of risk factors  as recommended by updated AHA guidelines1.  This includes anti-platelet medication, tight blood glucose control to a HbA1c < 7, tight blood pressure control, maximum-dose HMG-CoA reductase inhibitor. He was already successful with smoking cessation about 22 years ago.   After our discussion, they made it clear that they are not very interested in any procedures at this time.    I do think he has a good chance of healing the current right heel wound with good wound care.   Plan: - After our discussion, they are not interested in anything beyond conservative/medical care.  I think that he does have a good chance of healing the right heel wound with continued wound care.  -  He would like not to schedule a follow up at this time, and will call us back if needed. We are happy to see him back - I recommended that he perform some basic seated toe-raising maneuvers when he is at home to augment the calf-muscle pump and relieve some of the venous stasis symptoms -Recommend maximal medical therapy for cardiovascular risk reduction, including anti-platelet therapy.   ___________________________________________________________________   1Morley Kos MD, et al. 2016 AHA/ACC Guideline on the Management of Patients With Lower Extremity Peripheral Artery Disease: Executive Summary: A Report of the American College of Cardiology/American Heart Association Task Force on Clinical Practice Guidelines. J Am Coll Cardiol. 2017 Mar 21;69(11):1465-1508. doi: 10.1016/j.jacc.2016.11.008.   2 - Norgren L, et al. TASC II Working Group. Inter-society consensus for the management of peripheral arterial disease. Int Tressia Miners. 2007 Jun;26(2):81-157. Review. PubMed PMID: [id number]  3 - Hingorani A, et al. The management of diabetic foot: A clinical practice guideline by the Society for Vascular Surgery in collaboration with the [place] and the Society  for Vascular Medicine. J Vasc Surg. 2016 Feb;63(2 Suppl):3S-21S. doi: 10.1016/j.jvs.2015.10.003. PubMed PMID: [id number].  4 - Corinna Gab, Saab FA, Luberta Mutter, Grant Ruts, Ewell Poe, Driver VR, [place], Lookstein R, van den Baldemar Lenis, Jaff Daniel, Guadalupe Dawn, Henao S, AlMahameed A, Katzen B. Digital Subtraction Angiography Prior to an Amputation for Critical Limb Ischemia (CLI): An Expert Recommendation Statement From the CLI Global Society to Optimize Limb Salvage. J Endovasc Ther. 2020 Aug;27(4):540-546. doi: 10.1177/1526602820928590. Epub 2020 May 29. PMID: [id number].    Thank you for this interesting consult.  I greatly enjoyed meeting Daniel Woodard and look forward to participating in their care.  A copy of this report  was sent to the requesting provider on this date.  Electronically Signed: Corrie Mckusick 08/05/2022, 1:30 PM   I spent  a total of  60 Minutes   in face to face in clinical consultation, greater than 50% of which was counseling/coordinating care for mixed venous and arterial symptoms of the lower extremity, PAD, possible angiogram/intervention

## 2022-08-08 ENCOUNTER — Telehealth: Payer: Self-pay | Admitting: Internal Medicine

## 2022-08-08 ENCOUNTER — Encounter: Payer: Self-pay | Admitting: Internal Medicine

## 2022-08-08 NOTE — Telephone Encounter (Signed)
Ok for verbals 

## 2022-08-08 NOTE — Telephone Encounter (Signed)
Please advise for verbal orders 

## 2022-08-08 NOTE — Telephone Encounter (Signed)
[facility] called requesting verbal orders for Physical Therapy for this patient starting March 7. 1 time a week for 1 week, 2 times a week for 4 weeks, and 1 time a week for 4 weeks. Best callback number is [phone].

## 2022-08-11 NOTE — Telephone Encounter (Signed)
Cecelia at [facility] given ok by Dr.John for verbal orders

## 2022-08-12 ENCOUNTER — Ambulatory Visit: Payer: Medicare Other

## 2022-08-12 DIAGNOSIS — I5042 Chronic combined systolic (congestive) and diastolic (congestive) heart failure: Secondary | ICD-10-CM

## 2022-08-12 LAB — CUP PACEART REMOTE DEVICE CHECK
Battery Remaining Longevity: 70 mo
Battery Voltage: 2.98 V
Brady Statistic AP VP Percent: 2.36 %
Brady Statistic AP VS Percent: 0.01 %
Brady Statistic AS VP Percent: 97.35 %
Brady Statistic AS VS Percent: 0.28 %
Brady Statistic RA Percent Paced: 2.43 %
Brady Statistic RV Percent Paced: 99.71 %
Date Time Interrogation Session: 20240315010131
Implantable Lead Connection Status: 753985
Implantable Lead Connection Status: 753985
Implantable Lead Connection Status: 753985
Implantable Lead Implant Date: 20150212
Implantable Lead Implant Date: 20150212
Implantable Lead Implant Date: 20220318
Implantable Lead Location: 753858
Implantable Lead Location: 753859
Implantable Lead Location: 753860
Implantable Lead Model: 4598
Implantable Lead Model: 5076
Implantable Lead Model: 5076
Implantable Pulse Generator Implant Date: 20220318
Lead Channel Impedance Value: 285 Ohm
Lead Channel Impedance Value: 304 Ohm
Lead Channel Impedance Value: 304 Ohm
Lead Channel Impedance Value: 342 Ohm
Lead Channel Impedance Value: 380 Ohm
Lead Channel Impedance Value: 380 Ohm
Lead Channel Impedance Value: 418 Ohm
Lead Channel Impedance Value: 437 Ohm
Lead Channel Impedance Value: 513 Ohm
Lead Channel Impedance Value: 570 Ohm
Lead Channel Impedance Value: 608 Ohm
Lead Channel Impedance Value: 665 Ohm
Lead Channel Impedance Value: 703 Ohm
Lead Channel Impedance Value: 741 Ohm
Lead Channel Pacing Threshold Amplitude: 0.375 V
Lead Channel Pacing Threshold Amplitude: 0.875 V
Lead Channel Pacing Threshold Amplitude: 1.125 V
Lead Channel Pacing Threshold Pulse Width: 0.4 ms
Lead Channel Pacing Threshold Pulse Width: 0.4 ms
Lead Channel Pacing Threshold Pulse Width: 0.6 ms
Lead Channel Sensing Intrinsic Amplitude: 12.625 mV
Lead Channel Sensing Intrinsic Amplitude: 12.625 mV
Lead Channel Sensing Intrinsic Amplitude: 4.125 mV
Lead Channel Sensing Intrinsic Amplitude: 4.125 mV
Lead Channel Setting Pacing Amplitude: 1.5 V
Lead Channel Setting Pacing Amplitude: 2.5 V
Lead Channel Setting Pacing Amplitude: 2.5 V
Lead Channel Setting Pacing Pulse Width: 0.4 ms
Lead Channel Setting Pacing Pulse Width: 0.6 ms
Lead Channel Setting Sensing Sensitivity: 0.9 mV
Zone Setting Status: 755011

## 2022-08-17 NOTE — Telephone Encounter (Signed)
Daughter Pam called to check on the status of the documentation.  She would like a call back when it is ready at [phone].

## 2022-08-22 ENCOUNTER — Inpatient Hospital Stay (HOSPITAL_COMMUNITY)
Admission: EM | Admit: 2022-08-22 | Discharge: 2022-09-03 | DRG: 344 | Disposition: A | Discharge status: Skilled Nursing Facility | Payer: Medicare Other | Attending: Internal Medicine | Admitting: Internal Medicine

## 2022-08-22 ENCOUNTER — Encounter (HOSPITAL_COMMUNITY): Payer: Self-pay

## 2022-08-22 ENCOUNTER — Other Ambulatory Visit: Payer: Self-pay

## 2022-08-22 ENCOUNTER — Emergency Department (HOSPITAL_COMMUNITY): Payer: Medicare Other

## 2022-08-22 ENCOUNTER — Observation Stay (HOSPITAL_COMMUNITY): Payer: Medicare Other

## 2022-08-22 DIAGNOSIS — Z8249 Family history of ischemic heart disease and other diseases of the circulatory system: Secondary | ICD-10-CM

## 2022-08-22 DIAGNOSIS — E039 Hypothyroidism, unspecified: Secondary | ICD-10-CM | POA: Diagnosis present

## 2022-08-22 DIAGNOSIS — K612 Anorectal abscess: Principal | ICD-10-CM | POA: Diagnosis present

## 2022-08-22 DIAGNOSIS — J449 Chronic obstructive pulmonary disease, unspecified: Secondary | ICD-10-CM | POA: Diagnosis present

## 2022-08-22 DIAGNOSIS — E785 Hyperlipidemia, unspecified: Secondary | ICD-10-CM | POA: Diagnosis present

## 2022-08-22 DIAGNOSIS — Z79899 Other long term (current) drug therapy: Secondary | ICD-10-CM

## 2022-08-22 DIAGNOSIS — K625 Hemorrhage of anus and rectum: Secondary | ICD-10-CM | POA: Diagnosis not present

## 2022-08-22 DIAGNOSIS — Z8679 Personal history of other diseases of the circulatory system: Secondary | ICD-10-CM

## 2022-08-22 DIAGNOSIS — Z888 Allergy status to other drugs, medicaments and biological substances status: Secondary | ICD-10-CM

## 2022-08-22 DIAGNOSIS — R0989 Other specified symptoms and signs involving the circulatory and respiratory systems: Secondary | ICD-10-CM

## 2022-08-22 DIAGNOSIS — Z7984 Long term (current) use of oral hypoglycemic drugs: Secondary | ICD-10-CM

## 2022-08-22 DIAGNOSIS — E119 Type 2 diabetes mellitus without complications: Secondary | ICD-10-CM | POA: Diagnosis not present

## 2022-08-22 DIAGNOSIS — I708 Atherosclerosis of other arteries: Secondary | ICD-10-CM | POA: Diagnosis present

## 2022-08-22 DIAGNOSIS — N183 Chronic kidney disease, stage 3 unspecified: Secondary | ICD-10-CM | POA: Diagnosis present

## 2022-08-22 DIAGNOSIS — Z87891 Personal history of nicotine dependence: Secondary | ICD-10-CM

## 2022-08-22 DIAGNOSIS — I739 Peripheral vascular disease, unspecified: Secondary | ICD-10-CM

## 2022-08-22 DIAGNOSIS — J90 Pleural effusion, not elsewhere classified: Secondary | ICD-10-CM

## 2022-08-22 DIAGNOSIS — N1832 Chronic kidney disease, stage 3b: Secondary | ICD-10-CM | POA: Diagnosis present

## 2022-08-22 DIAGNOSIS — Z8719 Personal history of other diseases of the digestive system: Secondary | ICD-10-CM

## 2022-08-22 DIAGNOSIS — I700 Atherosclerosis of aorta: Secondary | ICD-10-CM | POA: Diagnosis present

## 2022-08-22 DIAGNOSIS — J9601 Acute respiratory failure with hypoxia: Secondary | ICD-10-CM | POA: Diagnosis not present

## 2022-08-22 DIAGNOSIS — I442 Atrioventricular block, complete: Secondary | ICD-10-CM | POA: Diagnosis present

## 2022-08-22 DIAGNOSIS — E876 Hypokalemia: Secondary | ICD-10-CM | POA: Diagnosis present

## 2022-08-22 DIAGNOSIS — Z66 Do not resuscitate: Secondary | ICD-10-CM | POA: Diagnosis not present

## 2022-08-22 DIAGNOSIS — J9819 Other pulmonary collapse: Secondary | ICD-10-CM | POA: Diagnosis not present

## 2022-08-22 DIAGNOSIS — I5023 Acute on chronic systolic (congestive) heart failure: Secondary | ICD-10-CM

## 2022-08-22 DIAGNOSIS — K219 Gastro-esophageal reflux disease without esophagitis: Secondary | ICD-10-CM | POA: Diagnosis present

## 2022-08-22 DIAGNOSIS — K921 Melena: Secondary | ICD-10-CM | POA: Diagnosis not present

## 2022-08-22 DIAGNOSIS — I5022 Chronic systolic (congestive) heart failure: Secondary | ICD-10-CM | POA: Diagnosis present

## 2022-08-22 DIAGNOSIS — M2518 Fistula, other specified site: Secondary | ICD-10-CM

## 2022-08-22 DIAGNOSIS — Z950 Presence of cardiac pacemaker: Secondary | ICD-10-CM

## 2022-08-22 DIAGNOSIS — Z86010 Personal history of colonic polyps: Secondary | ICD-10-CM

## 2022-08-22 DIAGNOSIS — E43 Unspecified severe protein-calorie malnutrition: Secondary | ICD-10-CM | POA: Insufficient documentation

## 2022-08-22 DIAGNOSIS — Z79890 Hormone replacement therapy (postmenopausal): Secondary | ICD-10-CM

## 2022-08-22 DIAGNOSIS — I2510 Atherosclerotic heart disease of native coronary artery without angina pectoris: Secondary | ICD-10-CM | POA: Diagnosis present

## 2022-08-22 DIAGNOSIS — E1151 Type 2 diabetes mellitus with diabetic peripheral angiopathy without gangrene: Secondary | ICD-10-CM | POA: Diagnosis present

## 2022-08-22 DIAGNOSIS — K611 Rectal abscess: Secondary | ICD-10-CM | POA: Diagnosis present

## 2022-08-22 DIAGNOSIS — K5730 Diverticulosis of large intestine without perforation or abscess without bleeding: Secondary | ICD-10-CM | POA: Diagnosis present

## 2022-08-22 DIAGNOSIS — K922 Gastrointestinal hemorrhage, unspecified: Principal | ICD-10-CM

## 2022-08-22 DIAGNOSIS — I480 Paroxysmal atrial fibrillation: Secondary | ICD-10-CM | POA: Diagnosis present

## 2022-08-22 DIAGNOSIS — Z801 Family history of malignant neoplasm of trachea, bronchus and lung: Secondary | ICD-10-CM

## 2022-08-22 DIAGNOSIS — I130 Hypertensive heart and chronic kidney disease with heart failure and stage 1 through stage 4 chronic kidney disease, or unspecified chronic kidney disease: Secondary | ICD-10-CM | POA: Diagnosis present

## 2022-08-22 DIAGNOSIS — I10 Essential (primary) hypertension: Secondary | ICD-10-CM | POA: Diagnosis present

## 2022-08-22 DIAGNOSIS — N179 Acute kidney failure, unspecified: Secondary | ICD-10-CM | POA: Diagnosis not present

## 2022-08-22 DIAGNOSIS — Z823 Family history of stroke: Secondary | ICD-10-CM

## 2022-08-22 DIAGNOSIS — K605 Anorectal fistula: Secondary | ICD-10-CM | POA: Diagnosis present

## 2022-08-22 DIAGNOSIS — N400 Enlarged prostate without lower urinary tract symptoms: Secondary | ICD-10-CM | POA: Diagnosis present

## 2022-08-22 DIAGNOSIS — Z6820 Body mass index (BMI) 20.0-20.9, adult: Secondary | ICD-10-CM

## 2022-08-22 DIAGNOSIS — E1122 Type 2 diabetes mellitus with diabetic chronic kidney disease: Secondary | ICD-10-CM | POA: Diagnosis present

## 2022-08-22 LAB — CBC WITH DIFFERENTIAL/PLATELET
Abs Immature Granulocytes: 0.03 10*3/uL (ref 0.00–0.07)
Basophils Absolute: 0 10*3/uL (ref 0.0–0.1)
Basophils Relative: 0 %
Eosinophils Absolute: 0 10*3/uL (ref 0.0–0.5)
Eosinophils Relative: 0 %
HCT: 57 % — ABNORMAL HIGH (ref 39.0–52.0)
Hemoglobin: 17.5 g/dL — ABNORMAL HIGH (ref 13.0–17.0)
Immature Granulocytes: 0 %
Lymphocytes Relative: 8 %
Lymphs Abs: 0.8 10*3/uL (ref 0.7–4.0)
MCH: 24.3 pg — ABNORMAL LOW (ref 26.0–34.0)
MCHC: 30.7 g/dL (ref 30.0–36.0)
MCV: 79.1 fL — ABNORMAL LOW (ref 80.0–100.0)
Monocytes Absolute: 0.7 10*3/uL (ref 0.1–1.0)
Monocytes Relative: 8 %
Neutro Abs: 7.7 10*3/uL (ref 1.7–7.7)
Neutrophils Relative %: 84 %
Platelets: 207 10*3/uL (ref 150–400)
RBC: 7.21 MIL/uL — ABNORMAL HIGH (ref 4.22–5.81)
RDW: 23.7 % — ABNORMAL HIGH (ref 11.5–15.5)
WBC: 9.3 10*3/uL (ref 4.0–10.5)
nRBC: 0 % (ref 0.0–0.2)

## 2022-08-22 LAB — BASIC METABOLIC PANEL
Anion gap: 17 — ABNORMAL HIGH (ref 5–15)
BUN: 33 mg/dL — ABNORMAL HIGH (ref 8–23)
CO2: 26 mmol/L (ref 22–32)
Calcium: 8.4 mg/dL — ABNORMAL LOW (ref 8.9–10.3)
Chloride: 95 mmol/L — ABNORMAL LOW (ref 98–111)
Creatinine, Ser: 1.65 mg/dL — ABNORMAL HIGH (ref 0.61–1.24)
GFR, Estimated: 39 mL/min — ABNORMAL LOW (ref >60)
Glucose, Bld: 92 mg/dL (ref 70–99)
Potassium: 3.4 mmol/L — ABNORMAL LOW (ref 3.5–5.1)
Sodium: 138 mmol/L (ref 135–145)

## 2022-08-22 LAB — TYPE AND SCREEN
ABO/RH(D): O POS
Antibody Screen: NEGATIVE

## 2022-08-22 LAB — GLUCOSE, CAPILLARY
Glucose-Capillary: 75 mg/dL (ref 70–99)
Glucose-Capillary: 77 mg/dL (ref 70–99)
Glucose-Capillary: 58 mg/dL — ABNORMAL LOW (ref 70–99)
Glucose-Capillary: 163 mg/dL — ABNORMAL HIGH (ref 70–99)
Glucose-Capillary: 64 mg/dL — ABNORMAL LOW (ref 70–99)

## 2022-08-22 LAB — HEMOGLOBIN AND HEMATOCRIT, BLOOD
HCT: 47.5 % (ref 39.0–52.0)
HCT: 44.7 % (ref 39.0–52.0)
HCT: 39.6 % (ref 39.0–52.0)
HCT: 52.3 % — ABNORMAL HIGH (ref 39.0–52.0)
Hemoglobin: 14.6 g/dL (ref 13.0–17.0)
Hemoglobin: 13.9 g/dL (ref 13.0–17.0)
Hemoglobin: 11.9 g/dL — ABNORMAL LOW (ref 13.0–17.0)
Hemoglobin: 15.8 g/dL (ref 13.0–17.0)

## 2022-08-22 LAB — MRSA NEXT GEN BY PCR, NASAL: MRSA by PCR Next Gen: NOT DETECTED

## 2022-08-22 MED ORDER — PANTOPRAZOLE SODIUM 40 MG IV SOLR
40.0000 mg | INTRAVENOUS | Status: DC
  Administered 2022-08-22 – 2022-08-24 (×3): 40 mg via INTRAVENOUS
  Filled 2022-08-22 (×3): qty 10

## 2022-08-22 MED ORDER — ACETAMINOPHEN 650 MG RE SUPP: 650.0000 mg | Freq: Four times a day (QID) | RECTAL | Status: DC | PRN

## 2022-08-22 MED ORDER — ONDANSETRON HCL 4 MG/2ML IJ SOLN: 4.0000 mg | Freq: Four times a day (QID) | INTRAMUSCULAR | Status: DC | PRN

## 2022-08-22 MED ORDER — ACETAMINOPHEN 325 MG PO TABS
650.0000 mg | ORAL_TABLET | Freq: Four times a day (QID) | ORAL | Status: DC | PRN
  Administered 2022-08-23 – 2022-08-28 (×10): 650 mg via ORAL
  Filled 2022-08-22 (×10): qty 2

## 2022-08-22 MED ORDER — DEXTROSE 50 % IV SOLN
12.5000 g | INTRAVENOUS | Status: AC
  Administered 2022-08-22: 12.5 g via INTRAVENOUS
  Filled 2022-08-22: qty 50

## 2022-08-22 MED ORDER — PIPERACILLIN-TAZOBACTAM 3.375 G IVPB
3.3750 g | Freq: Three times a day (TID) | INTRAVENOUS | Status: DC
  Administered 2022-08-22 – 2022-08-26 (×12): 3.375 g via INTRAVENOUS
  Filled 2022-08-22 (×12): qty 50

## 2022-08-22 MED ORDER — IOHEXOL 350 MG/ML SOLN
80.0000 mL | Freq: Once | INTRAVENOUS | Status: AC | PRN
  Administered 2022-08-22: 80 mL via INTRAVENOUS

## 2022-08-22 MED ORDER — SODIUM CHLORIDE (PF) 0.9 % IJ SOLN
INTRAMUSCULAR | Status: AC
  Filled 2022-08-22: qty 50

## 2022-08-22 MED ORDER — SODIUM CHLORIDE 0.9 % IV SOLN: Freq: Once | INTRAVENOUS | Status: AC

## 2022-08-22 MED ORDER — CHLORHEXIDINE GLUCONATE CLOTH 2 % EX PADS
6.0000 | MEDICATED_PAD | Freq: Every day | CUTANEOUS | Status: DC
  Administered 2022-08-22 – 2022-08-26 (×4): 6 via TOPICAL

## 2022-08-22 MED ORDER — ONDANSETRON HCL 4 MG PO TABS: 4.0000 mg | ORAL_TABLET | Freq: Four times a day (QID) | ORAL | Status: DC | PRN

## 2022-08-22 NOTE — ED Triage Notes (Signed)
BIBA from home c/o bright red rectal bleeding with some clots that started around 0300 today. Denies anticoagulant use. States hx of rectal bleeding and states that he's had to have a blood transfusion due to this in the past. Pt A&Ox4. Denies abd pain or rectal pain.   EMS vitals: BP 116/66, HR 78, SpO2 97%, CBG 147

## 2022-08-22 NOTE — Progress Notes (Signed)
PHARMACY NOTE -  [facility] has been assisting with dosing of Zosyn for perirectal abscess. Dosage remains stable at 3.375 g IV q8 hr and further renal adjustments per institutional Pharmacy antibiotic protocol  Pharmacy will sign off, following peripherally for culture results, dose adjustments, and length of therapy. Please reconsult if a change in clinical status warrants re-evaluation of dosage.  Reuel Boom, PharmD, BCPS [phone] 08/22/2022, 11:43 AM

## 2022-08-22 NOTE — Consult Note (Addendum)
Referring Provider: Dr. Tennis Must Primary Care Physician:  Biagio Borg, MD Primary Gastroenterologist:  Previously Dr. Deatra Ina, [facility]  Reason for Consultation: Painless hematochezia  HPI: Daniel Woodard is a 89 y.o. male with a past medical history of depression, hypertension, hyperlipidemia, coronary artery disease, chronic systolic CHF, paroxysmal atrial fibrillation not on anticoagulation, complete heart block, dual-chamber biventricular pacemaker, peripheral arterial disease s/p left carotid endarterectomy 2001, chronic occlusion of left subclavian artery, diabetes mellitus type 2, CKD stage III, COPD, GERD, colon polyps and perirectal/perianal abscess s/p I & D  06/08/2021.  Remote appendectomy secondary to ruptured appendix and gastric surgery for ulcer disease.  He presented to the ED this morning with bright red hematochezia with blood clots. Labs in the ED showed a hemoglobin level of 17.5 (Hg 14.7 on 08/02/2022).  Hematocrit 57.0.  MCV 79.1.  Platelet 207.  Sodium 138.  Potassium 3.4.  BUN 33.  Creatinine 1.65 ( Cr 1.99 on 08/02/2022).  Calcium 8.4.  Abdominal/pelvic CTA negative for active GI bleed but showed a U-shaped collar fluid density concerning for a perirectal abscess posterior to and abutting the low rectum/anus with an associated rim-enhancing fistula from the left side of the low rectum/anus down to the left skin surface of the intergluteal fold. Other CTA findings included prominent calcific plaque at the ostium of the SMA with probable stenosis, tiny 4-5 mm ulcerated plaque or penetrating ulcer anterior wall abdominal aorta just inferior to the IMA origin, left lower lobe collapse with small to moderate bilateral pleural effusions (left greater than right), subtle nodularity of liver contour suggestive of possible cirrhosis, diffuse body wall edema and aortic atherosclerosis.   Repeat hemoglobin 14.6.   He was admitted to the hospital 06/07/2021 due to having rectal pain and  constipation.  He was diagnosed with a perirectal/perianal abscess treated with IV antibiotics s/p I & D by colorectal surgery 06/08/2021 without postoperative complications. He was subsequently discharged home 06/10/2021.  He denies having any recent anorectal pain or discharge.  He typically passes a normal formed stool most days, but, for the past 3 to 4 days he has passed round of loose stools.  Early this morning, he passed red blood per the rectum as noted above without stool.  No abdominal pain.  No aspirin or blood thinners. His last colonoscopy was in 2013 which identified a small tubular adenomatous polyp removed from the transverse colon.  No further colonoscopies were recommended due to age.  No GERD symptoms on Omeprazole 20 mg daily.  His daughter Daniel Woodard is at the bed side.   PAST GI PROCEDURES:  Colonoscopy 05/17/2012 by Dr. Sharion Balloon polyp measuring 3 mm in size in the transverse colon, 2 mm polyp in the descending colon were removed severe diverticulosis in the sigmoid colon Internal hemorrhoids Surgical [P], ascending, transverse, descending, polyp - TUBULAR ADENOMAS (X2); NEGATIVE FOR HIGH GRADE DYSPLASIA OR MALIGNANCY. - FRAGMENTS OF MUCOSAL PROLAPSE POLYP (X1).  Colonoscopy 05/10/2007: 3 mm adenomatous polyp removed from the sigmoid colon Diverticulosis, Internal hemorrhoids    ECHO 06/30/2022: IMPRESSIONS Left ventricular ejection fraction, by estimation, is 30 to 35%. The left ventricle has moderately decreased function. The left ventricle demonstrates global hypokinesis with paradoxical septal motion in the setting of RV pacing. There is mild concentric left ventricular hypertrophy. Diastolic function indeterminant due to severe MAC. Elevated left atrial pressure. 1. The aortic valve is calcified. There is severe calcifcation of the aortic valve. There is severe thickening of the aortic valve. Aortic  valve regurgitation is mild. There is moderate-to-severe low flow,  low gradient aortic stenosis. Aortic valve area, by VTI measures 1.08 cm. Aortic valve mean gradient measures 9.0 mmHg. Aortic valve Vmax measures 2.12 m/s. DI 0.34, SVi 22cc/m2. 2. Right ventricular systolic function is mildly reduced. The right ventricular size is mildly enlarged. 3. The mitral valve is degenerative. Moderate mitral valve regurgitation. Mild mitral stenosis. Severe mitral annular calcification. 4. 5. Tricuspid valve regurgitation is mild to moderate. 6. Left atrial size was mildly dilated. 7. Right atrial size was mildly dilated.   Past Medical History:  Diagnosis Date   ALLERGIC RHINITIS 04/09/2007   Atrial fibrillation ([place])    BACTEREMIA, 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   BENIGN PROSTATIC HYPERTROPHY 04/09/2007   Blood transfusion without reported diagnosis    really not sure thinks 30-40 years ago   BRADYCARDIA 05/11/2009   Carotid artery occlusion    COLONIC POLYPS, HX OF 04/09/2007   ADENOMATOUS POLYPS 2004,2008   Complete heart block ([facility])    COPD 04/09/2007   Cough 03/27/2008   DEPRESSION 04/09/2007   DIABETES MELLITUS, TYPE II 10/09/2007   DIVERTICULOSIS, COLON 04/09/2007   DIZZINESS 10/06/2008   GLUCOSE INTOLERANCE 04/09/2007   HYPERLIPIDEMIA 03/27/2007   HYPERTENSION 03/27/2007   Kidney stone    PERIPHERAL VASCULAR DISEASE 03/27/2007   PNEUMONIA ORGANISM NOS 11/08/2007   SHINGLES, HX OF 04/09/2007   SHOULDER PAIN, BILATERAL 08/01/2007   Ulcer    40 years ago    Past Surgical History:  Procedure Laterality Date   APPENDECTOMY     89 years old   BIV UPGRADE N/A 08/14/2020   Procedure: BIV PPM UPGRADE;  Surgeon: Evans Lance, MD;  Location: [facility];  Service: Cardiovascular;  Laterality: N/A;   CAROTID ENDARTERECTOMY  5/08   left   CARPAL TUNNEL RELEASE     bilateral   COLONOSCOPY     ESOPHAGOGASTRODUODENOSCOPY  06/07/2004   HEMORRHOID BANDING  1990s   INCISION AND DRAINAGE ABSCESS N/A 06/08/2021   Procedure: INCISION AND  DRAINAGE ABSCESS;  Surgeon: Kieth Brightly Arta Bruce, MD;  Location: [facility];  Service: General;  Laterality: N/A;   IR RADIOLOGIST EVAL & MGMT  08/05/2022   LEFT HEART CATH AND CORONARY ANGIOGRAPHY N/A 06/02/2020   Procedure: LEFT HEART CATH AND CORONARY ANGIOGRAPHY;  Surgeon: Jettie Booze, MD;  Location: [facility];  Service: Cardiovascular;  Laterality: N/A;   PACEMAKER INSERTION  07/11/2013   MDT ADDRL1 pacemaker implanted by Dr Lovena Le for complete heart block   PERMANENT PACEMAKER INSERTION N/A 07/11/2013   Procedure: PERMANENT PACEMAKER INSERTION;  Surgeon: Evans Lance, MD;  Location: [facility];  Service: Cardiovascular;  Laterality: N/A;   s/p PUD surgury     ? partial gastrectomy    Prior to Admission medications   Medication Sig Start Date End Date Taking? Authorizing Provider  albuterol (VENTOLIN HFA) 108 (90 Base) MCG/ACT inhaler Inhale 2 puffs into the lungs every 6 (six) hours as needed for wheezing or shortness of breath. Patient not taking: Reported on 06/02/2022 04/08/22   Biagio Borg, MD  ASPIRIN 81 PO Take 81 mg by mouth daily. Patient not taking: Reported on 06/02/2022    [provider]  Coenzyme Q10 200 MG capsule Take by mouth. 07/17/15   [provider]  empagliflozin (JARDIANCE) 10 MG TABS tablet Take 1 tablet (10 mg total) by mouth daily before breakfast. 06/23/22   Croitoru, Mihai, MD  furosemide (LASIX) 40 MG tablet  Take 1 tablet (40 mg total) by mouth daily. 12/27/21   Evans Lance, MD  levothyroxine (SYNTHROID) 50 MCG tablet Take 1 tablet (50 mcg total) by mouth daily. 08/02/22   Biagio Borg, MD  Magnesium 400 MG TABS Take 400 mg by mouth daily. Patient not taking: Reported on 08/02/2022    [provider]  metoprolol succinate (TOPROL-XL) 25 MG 24 hr tablet Take 1/2 (one-half) tablet by mouth once daily 06/03/22   Biagio Borg, MD  omeprazole (PRILOSEC) 20 MG capsule Take 1 capsule by mouth once daily 11/19/21   Biagio Borg, MD   psyllium (METAMUCIL) 58.6 % packet Take 1 packet by mouth as needed. Patient not taking: Reported on 07/07/2022    [provider]  QUERCETIN PO Take 3 tablets by mouth 2 (two) times daily. With Vitamin C    [provider]  rosuvastatin (CRESTOR) 20 MG tablet Take 1 tablet (20 mg total) by mouth at bedtime. 06/10/21   Swayze, Ava, DO  traZODone (DESYREL) 50 MG tablet Take 1 tablet (50 mg total) by mouth at bedtime. Patient not taking: Reported on 07/07/2022 04/08/22   Biagio Borg, MD  VITAMIN D PO Take 5,000 Units by mouth daily.    [provider]  vitamin k 100 MCG tablet Take 100 mcg by mouth daily.    [provider]  zinc gluconate 50 MG tablet Take 50 mg by mouth 2 (two) times daily.    [provider]    Current Facility-Administered Medications  Medication Dose Route Frequency Provider Last Rate Last Admin   acetaminophen (TYLENOL) tablet 650 mg  650 mg Oral Q6H PRN [name], MD       Or   acetaminophen (TYLENOL) suppository 650 mg  650 mg Rectal Q6H PRN [name], MD       ondansetron [facility]) tablet 4 mg  4 mg Oral Q6H PRN [name], MD       Or   ondansetron [facility]) injection 4 mg  4 mg Intravenous Q6H PRN [name], MD       pantoprazole (PROTONIX) injection 40 mg  40 mg Intravenous Q24H [name], MD       sodium chloride (PF) 0.9 % injection            Current Outpatient Medications  Medication Sig Dispense Refill   albuterol (VENTOLIN HFA) 108 (90 Base) MCG/ACT inhaler Inhale 2 puffs into the lungs every 6 (six) hours as needed for wheezing or shortness of breath. (Patient not taking: Reported on 06/02/2022) 8 g 11   ASPIRIN 81 PO Take 81 mg by mouth daily. (Patient not taking: Reported on 06/02/2022)     Coenzyme Q10 200 MG capsule Take by mouth.     empagliflozin (JARDIANCE) 10 MG TABS tablet Take 1 tablet (10 mg total) by mouth daily before breakfast. 14 tablet 0   furosemide  (LASIX) 40 MG tablet Take 1 tablet (40 mg total) by mouth daily. 90 tablet 3   levothyroxine (SYNTHROID) 50 MCG tablet Take 1 tablet (50 mcg total) by mouth daily. 90 tablet 3   Magnesium 400 MG TABS Take 400 mg by mouth daily. (Patient not taking: Reported on 08/02/2022)     metoprolol succinate (TOPROL-XL) 25 MG 24 hr tablet Take 1/2 (one-half) tablet by mouth once daily 45 tablet 0   omeprazole (PRILOSEC) 20 MG capsule Take 1 capsule by mouth once daily 90 capsule 3  psyllium (METAMUCIL) 58.6 % packet Take 1 packet by mouth as needed. (Patient not taking: Reported on 07/07/2022)     QUERCETIN PO Take 3 tablets by mouth 2 (two) times daily. With Vitamin C     rosuvastatin (CRESTOR) 20 MG tablet Take 1 tablet (20 mg total) by mouth at bedtime. 30 tablet 0   traZODone (DESYREL) 50 MG tablet Take 1 tablet (50 mg total) by mouth at bedtime. (Patient not taking: Reported on 07/07/2022) 90 tablet 1   VITAMIN D PO Take 5,000 Units by mouth daily.     vitamin k 100 MCG tablet Take 100 mcg by mouth daily.     zinc gluconate 50 MG tablet Take 50 mg by mouth 2 (two) times daily.      Allergies as of 08/22/2022 - Review Complete 08/22/2022  Allergen Reaction Noted   Ace inhibitors Cough 04/09/2007    Family History  Problem Relation Age of Onset   Hypertension Father    Ulcers Father    Hypertension Mother    Hypertension Sister    Peripheral vascular disease Brother    Hypertension Sister    Ulcers Sister        Ulcers   Hypertension Sister    Lung cancer Sister        smoked   Heart disease Sister        Carotid    Hypertension Sister    Heart disease Other        Cardiovascular disorder, CHF   Stroke Other        1st degree relative male and male   Colon cancer Neg Hx     Social History   Socioeconomic History   Marital status: Widowed    Spouse name: Not on file   Number of children: 2   Years of education: Not on file   Highest education level: Not on file  Occupational  History   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  Tobacco Use   Smoking status: Former    Packs/day: 1.00    Years: 50.00    Additional pack years: 0.00    Total pack years: 50.00    Types: Cigarettes    Quit date: 08/20/1999    Years since quitting: 23.0   Smokeless tobacco: Never  Vaping Use   Vaping Use: Never used  Substance and Sexual Activity   Alcohol use: No    Alcohol/week: 0.0 standard drinks of alcohol   Drug use: No   Sexual activity: Never  Other Topics Concern   Not on file  Social History Narrative   Widowed; wife dies Sep 03, 2018   Daily caffeine    Social Determinants of Health   Financial Resource Strain: Low Risk  (02/25/2022)   Overall Financial Resource Strain (CARDIA)    Difficulty of Paying Living Expenses: Not hard at all  Food Insecurity: No Food Insecurity (02/25/2022)   Hunger Vital Sign    Worried About Running Out of Food in the Last Year: Never true    Ran Out of Food in the Last Year: Never true  Transportation Needs: No Transportation Needs (02/25/2022)   PRAPARE - Hydrologist (Medical): No    Lack of Transportation (Non-Medical): No  Physical Activity: Inactive (02/25/2022)   Exercise Vital Sign    Days of Exercise per Week: 0 days    Minutes of Exercise per Session: 0 min  Stress: No Stress Concern Present (02/25/2022)  [place]    Feeling of Stress : Not at all  Social Connections: Unknown (02/25/2022)   Social Connection and Isolation Panel [NHANES]    Frequency of Communication with Friends and Family: More than three times a week    Frequency of Social Gatherings with Friends and Family: More than three times a week    Attends Religious Services: Never    Marine scientist or Organizations: No    Attends Archivist Meetings: Never    Marital Status: Patient declined  Human resources officer  Violence: Not At Risk (02/25/2022)   Humiliation, Afraid, Rape, and Kick questionnaire    Fear of Current or Ex-Partner: No    Emotionally Abused: No    Physically Abused: No    Sexually Abused: No    Review of Systems: Gen: Denies fever, sweats or chills. No weight loss.  CV: Denies chest pain, palpitations or edema. Resp: Denies cough, shortness of breath of hemoptysis.  GI:See HPI.   GU : Denies urinary burning, blood in urine, increased urinary frequency or incontinence. MS: Denies joint pain, muscles aches or weakness. Derm: Denies rash, itchiness, skin lesions or unhealing ulcers. Psych: + Depression, mild confusion.  Heme: + Easy bruising.  Neuro:  Denies headaches, dizziness or paresthesias. Endo: + Diabetes and hypothyroidism.  Physical Exam: Vital signs in last 24 hours: Temp:  [97.1 F (36.2 C)] 97.1 F (36.2 C) (03/25 0424) Pulse Rate:  [72-73] 73 (03/25 0800) Resp:  [13-18] 15 (03/25 0800) BP: (139-155)/(66-77) 139/69 (03/25 0800) SpO2:  [91 %-98 %] 96 % (03/25 0800) Weight:  [75.8 kg] 75.8 kg (03/25 0420)   General: Alert 89 year old male in no acute distress. Head:  Normocephalic and atraumatic. Eyes:  No scleral icterus. Conjunctiva pink. Ears:  Normal auditory acuity. Nose:  No deformity, discharge or lesions. Mouth: Absent dentition.  No ulcers or lesions.  Neck:  Supple. No lymphadenopathy or thyromegaly.  Lungs: Breath sounds clear throughout. No wheezes, rhonchi or crackles.  Heart: Regular rate and rhythm, distant S1, S2.  No murmur. Abdomen: Soft, nondistended.  Positive bowel sounds all 4 quadrants.  Upper abdominal midline scar intact, lower midline abdominal scar intact.  No palpable mass.  No hepatosplenomegaly. Rectal: Active red blood oozing from left gluteal fistula.  Anal sphincter, limited internal rectal exam.  Light brown soft stool in the rectal vault.  Daughter and nursing tech at bedside time of exam.  Musculoskeletal:  Symmetrical  without gross deformities.  Pulses:  Normal pulses noted. Extremities: Bilateral lower extremities erythematous consistent with PAD, left heel Ace wrap intact.  No edema. Neurologic:  Alert and  oriented x 4. No focal deficits.  Skin: Left heel Ace wrap intact.  Bilateral lower extremities erythematous Psych:  Alert and cooperative. Normal mood and affect.  Intake/Output from previous day: No intake/output data recorded. Intake/Output this shift: No intake/output data recorded.  Lab Results: Recent Labs    08/22/22 0420 08/22/22 0646  WBC 9.3  --   HGB 17.5* 14.6  HCT 57.0* 47.5  PLT 207  --    BMET Recent Labs    08/22/22 0420  NA 138  K 3.4*  CL 95*  CO2 26  GLUCOSE 92  BUN 33*  CREATININE 1.65*  CALCIUM 8.4*   LFT No results for input(s): "PROT", "ALBUMIN", "AST", "ALT", "ALKPHOS", "BILITOT", "BILIDIR", "IBILI" in the last 72 hours. PT/INR No results for input(s): "LABPROT", "INR" in the last 72 hours. Hepatitis  Panel No results for input(s): "HEPBSAG", "HCVAB", "HEPAIGM", "HEPBIGM" in the last 72 hours.    Studies/Results: CT ANGIO GI BLEED  Result Date: 08/22/2022 CLINICAL DATA:  Lower GI bleed.  Rectal bleeding. EXAM: CTA ABDOMEN AND PELVIS WITHOUT AND WITH CONTRAST TECHNIQUE: Multidetector CT imaging of the abdomen and pelvis was performed using the standard protocol during bolus administration of intravenous contrast. Multiplanar reconstructed images and MIPs were obtained and reviewed to evaluate the vascular anatomy. RADIATION DOSE REDUCTION: This exam was performed according to the departmental dose-optimization program which includes automated exposure control, adjustment of the mA and/or kV according to patient size and/or use of iterative reconstruction technique. CONTRAST:  51mL OMNIPAQUE IOHEXOL 350 MG/ML SOLN COMPARISON:  Abdomen and pelvis CT 06/07/2021 FINDINGS: VASCULAR Aorta: Normal caliber aorta without aneurysm, dissection, vasculitis or  significant stenosis. Tiny 4-5 mm ulcerated plaque or penetrating ulcer noted anterior wall abdominal aorta just inferior to the IMA origin (axial 116/5). Prominent atherosclerotic calcification and mural thrombus noted. Celiac: Patent without evidence of aneurysm, dissection, vasculitis or significant stenosis. SMA: Prominent calcific plaque at the ostium with probable flow limiting stenosis (see axial 75/5 and coronal 59/12). Renals: Prominent calcific plaque at the proximal renal arteries bilaterally without definite features of flow limiting stenosis. IMA: Patent without evidence of aneurysm, dissection, vasculitis or significant stenosis. Inflow: Patent without evidence of aneurysm, dissection, vasculitis or significant stenosis. Proximal Outflow: Choose 1 Veins: No obvious venous abnormality within the limitations of this arterial phase study. Review of the MIP images confirms the above findings. NON-VASCULAR Lower chest: Left lower lobe collapse noted with small to moderate bilateral pleural effusions, left greater than right. Hepatobiliary: Subtle nodularity of liver contour raises the question of cirrhosis. No evidence for focal arterial phase hyperenhancement within the parenchyma. There is no evidence for gallstones, gallbladder wall thickening, or pericholecystic fluid. No intrahepatic or extrahepatic biliary dilation. Pancreas: No focal mass lesion. No dilatation of the main duct. No intraparenchymal cyst. No peripancreatic edema. Spleen: No splenomegaly. No focal mass lesion. Adrenals/Urinary Tract: Bilateral adrenal thickening without a discrete nodule or mass. Left kidney unremarkable. Tiny hypodensity in the interpolar right kidney is likely a simple cyst. No followup imaging is recommended. No evidence for hydroureter. The urinary bladder appears normal for the degree of distention. Stomach/Bowel: Unremarkable. No evidence for arterial phase contrast extravasation into the gastric lumen. No  evidence for active hemorrhage into the duodenum. No findings to suggest arterial phase extravasation of contrast in the small bowel or colon. Advanced diverticular disease is noted in the sigmoid colon with inspissated high density material in numerous sigmoid colon diverticuli. A thin collar of fluid density measuring only a bowel 5 mm in diameter with peripheral rim enhancement is seen posterior to the low rectum/anus tracking anteriorly on both sides. This is associated with a rim enhancing fluid collection that tracks caudally in the left ischial anal fat to the left skin surface of the intergluteal fold (see images 89-96 of series 7 and coronal images 82-104 of series 15). Lymphatic: There is no gastrohepatic or hepatoduodenal ligament lymphadenopathy. No retroperitoneal or mesenteric lymphadenopathy. No pelvic sidewall lymphadenopathy. Reproductive: The prostate gland and seminal vesicles are unremarkable. Other: No intraperitoneal free fluid. Musculoskeletal: Diffuse body wall edema evident. No worrisome lytic or sclerotic osseous abnormality. IMPRESSION: 1. No evidence for active arterial phase contrast extravasation into the gastric lumen, duodenum, small bowel, or colon. 2. There is a thin U shaped collar of fluid density with peripheral rim enhancement (perirectal abscess) posterior to  and abutting the low rectum/anus over about 180 degree arc with an associated rim enhancing fistula from the left side of the low rectum/anus down to the left skin surface of the intergluteal fold. 3. Prominent calcific plaque at the ostium of the SMA with probable flow limiting stenosis. 4. Tiny 4-5 mm ulcerated plaque or penetrating ulcer anterior wall abdominal aorta just inferior to the IMA origin. 5. Advanced sigmoid diverticular disease with inspissated high density material in numerous sigmoid colon diverticuli. 6. Left lower lobe collapse with small to moderate bilateral pleural effusions, left greater than right.  7. Subtle nodularity of liver contour raises the question of cirrhosis. 8. Diffuse body wall edema. 9.  Aortic Atherosclerosis (ICD10-I70.0). Electronically Signed   By: Misty Stanley M.D.   On: 08/22/2022 06:59    IMPRESSION/PLAN:  89 year old male with a history of a perirectal abscess s/p I & D 05/2021 with presented to the ED with painless hematochezia.  Hg 17.5 (base line Hg 14.7 on 08/02/2022) -> repeat Hg 14.6. Abdominal/pelvic CTA negative for active GI bleed but showed a U-shaped collar fluid density concerning for a perirectal abscess posterior to and abutting the low rectum/anus with an associated rim-enhancing fistula from the left side of the low rectum/anus down to the left skin surface of the intergluteal fold with advanced sigmoid diverticular disease with high density material in numerous sigmoid colon diverticuli. Rectal exam showed oozing red blood from a left external anorectal fistula (see photos above).  Hemodynamically stable. -Colorectal surgery consult -Check H&H every 6 hours x 24 hours -Transfuse for hemoglobin less than 8 -Possible flexible sigmoidoscopy during his hospital admission, await colorectal surgery recommendations  -NPO -IV fluids per the hospitalist -Further recommendations per Dr. Lyndel Safe   History of tubular adenomatous colon polyps per colonoscopy 04/2012  History of GERD -PPI IV daily  Left lower lobe collapse with small to moderate bilateral pleural effusions, left greater than right per CTA. No focal evidence of respiratory distress.  Subtle nodularity of liver contour raises the question of cirrhosis. -CMP  History of CAD. CHF with LVEF 30 -35%  History of paroxysmal atrial fibrillation not on anticoagulation  PAD. CTA showed prominent calcific plaque at the ostium of the SMA with probable flow limiting stenosis, tiny 4-5 mm ulcerated plaque or penetrating ulcer anterior wall abdominal aorta just inferior to the IMA origin and aortic  atherosclerosis.   DM type II     Noralyn Pick  08/22/2022, 10:17AM    Attending physician's note   I have taken history, reviewed the chart and examined the patient. I performed a substantive portion of this encounter, including complete performance of at least one of the key components, in conjunction with the APP. I agree with the Advanced Practitioner's note, impression and recommendations.   Active bleeding from L gluteal fistula- as evident from pictures above (by Jaclyn Shaggy). Neg CTA for bleed H/O perirectal abscess s/p I&D Jan 2023. Now with recurrent abscess- r/o mass. CT showing subtle liver nodularity. ?cirrhosis. Nl plts  Plan: -FS in AM (r/o masses, doubt crohns) -Check LFTs, INR in AM -Appreciate surgical consultation. -Trend CBC.   Carmell Austria, MD [facility] [phone]

## 2022-08-22 NOTE — ED Provider Notes (Signed)
[facility] DEPT Provider Note: Georgena Spurling, MD, FACEP  CSN: [id number] MRN: [id number] ARRIVAL: 08/22/22 at [place]: RESA/RESA   CHIEF COMPLAINT  Rectal Bleeding   HISTORY OF PRESENT ILLNESS  08/22/22 5:04 AM Daniel Woodard is a 89 y.o. male with bright red blood and clots per rectum that began about 3 AM.  The bleeding was profuse but preceded by normal stool-colored diarrhea.  He is not on any anticoagulation.  He has a history of rectal bleeding in the past requiring transfusion.  He has a history of colonic polyps and diverticulosis.  He is not having any pain.    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   BENIGN PROSTATIC HYPERTROPHY 04/09/2007   Blood transfusion without reported diagnosis    really not sure thinks 30-40 years ago   BRADYCARDIA 05/11/2009   Carotid artery occlusion    COLONIC POLYPS, HX OF 04/09/2007   ADENOMATOUS POLYPS 2004,2008   Complete heart block (HCC)    COPD 04/09/2007   Cough 03/27/2008   DEPRESSION 04/09/2007   DIABETES MELLITUS, TYPE II 10/09/2007   DIVERTICULOSIS, COLON 04/09/2007   DIZZINESS 10/06/2008   GLUCOSE INTOLERANCE 04/09/2007   HYPERLIPIDEMIA 03/27/2007   HYPERTENSION 03/27/2007   Kidney stone    PERIPHERAL VASCULAR DISEASE 03/27/2007   PNEUMONIA ORGANISM NOS 11/08/2007   SHINGLES, HX OF 04/09/2007   SHOULDER PAIN, BILATERAL 08/01/2007   Ulcer    40 years ago    Past Surgical History:  Procedure Laterality Date   APPENDECTOMY     89 years old   BIV UPGRADE N/A 08/14/2020   Procedure: BIV PPM UPGRADE;  Surgeon: Evans Lance, MD;  Location: [facility];  Service: Cardiovascular;  Laterality: N/A;   CAROTID ENDARTERECTOMY  5/08   left   CARPAL TUNNEL RELEASE     bilateral   COLONOSCOPY     ESOPHAGOGASTRODUODENOSCOPY  06/07/2004   HEMORRHOID BANDING  1990s   INCISION AND DRAINAGE ABSCESS N/A  06/08/2021   Procedure: INCISION AND DRAINAGE ABSCESS;  Surgeon: Kieth Brightly Arta Bruce, MD;  Location: [facility];  Service: General;  Laterality: N/A;   IR RADIOLOGIST EVAL & MGMT  08/05/2022   LEFT HEART CATH AND CORONARY ANGIOGRAPHY N/A 06/02/2020   Procedure: LEFT HEART CATH AND CORONARY ANGIOGRAPHY;  Surgeon: Jettie Booze, MD;  Location: [facility];  Service: Cardiovascular;  Laterality: N/A;   PACEMAKER INSERTION  07/11/2013   MDT ADDRL1 pacemaker implanted by Dr Lovena Le for complete heart block   PERMANENT PACEMAKER INSERTION N/A 07/11/2013   Procedure: PERMANENT PACEMAKER INSERTION;  Surgeon: Evans Lance, MD;  Location: [facility];  Service: Cardiovascular;  Laterality: N/A;   s/p PUD surgury     ? partial gastrectomy    Family History  Problem Relation Age of Onset   Hypertension Father    Ulcers Father    Hypertension Mother    Hypertension Sister    Peripheral vascular disease Brother    Hypertension Sister    Ulcers Sister        Ulcers   Hypertension Sister    Lung cancer Sister        smoked   Heart disease Sister        Carotid    Hypertension Sister    Heart disease Other        Cardiovascular disorder, CHF   Stroke Other  1st degree relative male and male   Colon cancer Neg Hx     Social History   Tobacco Use   Smoking status: Former    Packs/day: 1.00    Years: 50.00    Additional pack years: 0.00    Total pack years: 50.00    Types: Cigarettes    Quit date: 08/20/1999    Years since quitting: 23.0   Smokeless tobacco: Never  Vaping Use   Vaping Use: Never used  Substance Use Topics   Alcohol use: No    Alcohol/week: 0.0 standard drinks of alcohol   Drug use: No    Prior to Admission medications   Medication Sig Start Date End Date Taking? Authorizing Provider  albuterol (VENTOLIN HFA) 108 (90 Base) MCG/ACT inhaler Inhale 2 puffs into the lungs every 6 (six) hours as needed for wheezing or shortness of breath. Patient not  taking: Reported on 06/02/2022 04/08/22   Biagio Borg, MD  ASPIRIN 81 PO Take 81 mg by mouth daily. Patient not taking: Reported on 06/02/2022    [provider]  Coenzyme Q10 200 MG capsule Take by mouth. 07/17/15   [provider]  empagliflozin (JARDIANCE) 10 MG TABS tablet Take 1 tablet (10 mg total) by mouth daily before breakfast. 06/23/22   Croitoru, Mihai, MD  furosemide (LASIX) 40 MG tablet Take 1 tablet (40 mg total) by mouth daily. 12/27/21   Evans Lance, MD  levothyroxine (SYNTHROID) 50 MCG tablet Take 1 tablet (50 mcg total) by mouth daily. 08/02/22   Biagio Borg, MD  Magnesium 400 MG TABS Take 400 mg by mouth daily. Patient not taking: Reported on 08/02/2022    [provider]  metoprolol succinate (TOPROL-XL) 25 MG 24 hr tablet Take 1/2 (one-half) tablet by mouth once daily 06/03/22   Biagio Borg, MD  omeprazole (PRILOSEC) 20 MG capsule Take 1 capsule by mouth once daily 11/19/21   Biagio Borg, MD  psyllium (METAMUCIL) 58.6 % packet Take 1 packet by mouth as needed. Patient not taking: Reported on 07/07/2022    [provider]  QUERCETIN PO Take 3 tablets by mouth 2 (two) times daily. With Vitamin C    [provider]  rosuvastatin (CRESTOR) 20 MG tablet Take 1 tablet (20 mg total) by mouth at bedtime. 06/10/21   Swayze, Ava, DO  traZODone (DESYREL) 50 MG tablet Take 1 tablet (50 mg total) by mouth at bedtime. Patient not taking: Reported on 07/07/2022 04/08/22   Biagio Borg, MD  VITAMIN D PO Take 5,000 Units by mouth daily.    [provider]  vitamin k 100 MCG tablet Take 100 mcg by mouth daily.    [provider]  zinc gluconate 50 MG tablet Take 50 mg by mouth 2 (two) times daily.    [provider]    Allergies Ace inhibitors   REVIEW OF SYSTEMS  Negative except as noted here or in the History of Present Illness.   PHYSICAL EXAMINATION  Initial Vital Signs Blood pressure (!) 155/77, pulse 72,  temperature (!) 97.1 F (36.2 C), temperature source Axillary, resp. rate 18, height 5\' 9"  (1.753 m), weight 75.8 kg, SpO2 98 %.  Examination General: Well-developed, well-nourished male in no acute distress; appearance consistent with age of record HENT: normocephalic; atraumatic Eyes: Normal appearance Neck: supple Heart: regular rate and rhythm; paced rhythm Lungs: clear to auscultation bilaterally Abdomen: soft; nondistended; nontender; bowel sounds present Rectal: Gross blood per rectum and  on examining glove high in the rectal vault Extremities: No deformity; full range of motion; pulses normal Neurologic: Awake, alert; motor function intact in all extremities and symmetric; no facial droop Skin: Warm and dry; bandages of right lower leg and feet Psychiatric: Flat affect   RESULTS  Summary of this visit's results, reviewed and interpreted by myself:   EKG Interpretation  Date/Time:    Ventricular Rate:    PR Interval:    QRS Duration:   QT Interval:    QTC Calculation:   R Axis:     Text Interpretation:         Laboratory Studies: Results for orders placed or performed during the hospital encounter of 08/22/22 (from the past 24 hour(s))  CBC with Differential     Status: Abnormal   Collection Time: 08/22/22  4:20 AM  Result Value Ref Range   WBC 9.3 4.0 - 10.5 K/uL   RBC 7.21 (H) 4.22 - 5.81 MIL/uL   Hemoglobin 17.5 (H) 13.0 - 17.0 g/dL   HCT 57.0 (H) 39.0 - 52.0 %   MCV 79.1 (L) 80.0 - 100.0 fL   MCH 24.3 (L) 26.0 - 34.0 pg   MCHC 30.7 30.0 - 36.0 g/dL   RDW 23.7 (H) 11.5 - 15.5 %   Platelets 207 150 - 400 K/uL   nRBC 0.0 0.0 - 0.2 %   Neutrophils Relative % 84 %   Neutro Abs 7.7 1.7 - 7.7 K/uL   Lymphocytes Relative 8 %   Lymphs Abs 0.8 0.7 - 4.0 K/uL   Monocytes Relative 8 %   Monocytes Absolute 0.7 0.1 - 1.0 K/uL   Eosinophils Relative 0 %   Eosinophils Absolute 0.0 0.0 - 0.5 K/uL   Basophils Relative 0 %   Basophils Absolute 0.0 0.0 - 0.1 K/uL    Immature Granulocytes 0 %   Abs Immature Granulocytes 0.03 0.00 - 0.07 K/uL   Tear Drop Cells PRESENT    Ovalocytes PRESENT   Basic metabolic panel     Status: Abnormal   Collection Time: 08/22/22  4:20 AM  Result Value Ref Range   Sodium 138 135 - 145 mmol/L   Potassium 3.4 (L) 3.5 - 5.1 mmol/L   Chloride 95 (L) 98 - 111 mmol/L   CO2 26 22 - 32 mmol/L   Glucose, Bld 92 70 - 99 mg/dL   BUN 33 (H) 8 - 23 mg/dL   Creatinine, Ser 1.65 (H) 0.61 - 1.24 mg/dL   Calcium 8.4 (L) 8.9 - 10.3 mg/dL   GFR, Estimated 39 (L) >60 mL/min   Anion gap 17 (H) 5 - 15  Type and screen [place]     Status: None   Collection Time: 08/22/22  4:20 AM  Result Value Ref Range   ABO/RH(D) O POS    Antibody Screen NEG    Sample Expiration      08/25/2022,2359 Performed at [facility], [address], [place], [place] [address]   Hemoglobin and hematocrit, blood     Status: None   Collection Time: 08/22/22  6:46 AM  Result Value Ref Range   Hemoglobin 14.6 13.0 - 17.0 g/dL   HCT 47.5 39.0 - 52.0 %   Imaging Studies: CT ANGIO GI BLEED  Result Date: 08/22/2022 CLINICAL DATA:  Lower GI bleed.  Rectal bleeding. EXAM: CTA ABDOMEN AND PELVIS WITHOUT AND WITH CONTRAST TECHNIQUE: Multidetector CT imaging of the abdomen and pelvis was performed using the standard protocol during  bolus administration of intravenous contrast. Multiplanar reconstructed images and MIPs were obtained and reviewed to evaluate the vascular anatomy. RADIATION DOSE REDUCTION: This exam was performed according to the departmental dose-optimization program which includes automated exposure control, adjustment of the mA and/or kV according to patient size and/or use of iterative reconstruction technique. CONTRAST:  46mL OMNIPAQUE IOHEXOL 350 MG/ML SOLN COMPARISON:  Abdomen and pelvis CT 06/07/2021 FINDINGS: VASCULAR Aorta: Normal caliber aorta without aneurysm, dissection, vasculitis or significant  stenosis. Tiny 4-5 mm ulcerated plaque or penetrating ulcer noted anterior wall abdominal aorta just inferior to the IMA origin (axial 116/5). Prominent atherosclerotic calcification and mural thrombus noted. Celiac: Patent without evidence of aneurysm, dissection, vasculitis or significant stenosis. SMA: Prominent calcific plaque at the ostium with probable flow limiting stenosis (see axial 75/5 and coronal 59/12). Renals: Prominent calcific plaque at the proximal renal arteries bilaterally without definite features of flow limiting stenosis. IMA: Patent without evidence of aneurysm, dissection, vasculitis or significant stenosis. Inflow: Patent without evidence of aneurysm, dissection, vasculitis or significant stenosis. Proximal Outflow: Choose 1 Veins: No obvious venous abnormality within the limitations of this arterial phase study. Review of the MIP images confirms the above findings. NON-VASCULAR Lower chest: Left lower lobe collapse noted with small to moderate bilateral pleural effusions, left greater than right. Hepatobiliary: Subtle nodularity of liver contour raises the question of cirrhosis. No evidence for focal arterial phase hyperenhancement within the parenchyma. There is no evidence for gallstones, gallbladder wall thickening, or pericholecystic fluid. No intrahepatic or extrahepatic biliary dilation. Pancreas: No focal mass lesion. No dilatation of the main duct. No intraparenchymal cyst. No peripancreatic edema. Spleen: No splenomegaly. No focal mass lesion. Adrenals/Urinary Tract: Bilateral adrenal thickening without a discrete nodule or mass. Left kidney unremarkable. Tiny hypodensity in the interpolar right kidney is likely a simple cyst. No followup imaging is recommended. No evidence for hydroureter. The urinary bladder appears normal for the degree of distention. Stomach/Bowel: Unremarkable. No evidence for arterial phase contrast extravasation into the gastric lumen. No evidence for active  hemorrhage into the duodenum. No findings to suggest arterial phase extravasation of contrast in the small bowel or colon. Advanced diverticular disease is noted in the sigmoid colon with inspissated high density material in numerous sigmoid colon diverticuli. A thin collar of fluid density measuring only a bowel 5 mm in diameter with peripheral rim enhancement is seen posterior to the low rectum/anus tracking anteriorly on both sides. This is associated with a rim enhancing fluid collection that tracks caudally in the left ischial anal fat to the left skin surface of the intergluteal fold (see images 89-96 of series 7 and coronal images 82-104 of series 15). Lymphatic: There is no gastrohepatic or hepatoduodenal ligament lymphadenopathy. No retroperitoneal or mesenteric lymphadenopathy. No pelvic sidewall lymphadenopathy. Reproductive: The prostate gland and seminal vesicles are unremarkable. Other: No intraperitoneal free fluid. Musculoskeletal: Diffuse body wall edema evident. No worrisome lytic or sclerotic osseous abnormality. IMPRESSION: 1. No evidence for active arterial phase contrast extravasation into the gastric lumen, duodenum, small bowel, or colon. 2. There is a thin U shaped collar of fluid density with peripheral rim enhancement (perirectal abscess) posterior to and abutting the low rectum/anus over about 180 degree arc with an associated rim enhancing fistula from the left side of the low rectum/anus down to the left skin surface of the intergluteal fold. 3. Prominent calcific plaque at the ostium of the SMA with probable flow limiting stenosis. 4. Tiny 4-5 mm ulcerated plaque or penetrating ulcer anterior wall  abdominal aorta just inferior to the IMA origin. 5. Advanced sigmoid diverticular disease with inspissated high density material in numerous sigmoid colon diverticuli. 6. Left lower lobe collapse with small to moderate bilateral pleural effusions, left greater than right. 7. Subtle nodularity  of liver contour raises the question of cirrhosis. 8. Diffuse body wall edema. 9.  Aortic Atherosclerosis (ICD10-I70.0). Electronically Signed   By: Misty Stanley M.D.   On: 08/22/2022 06:59    ED COURSE and MDM  Nursing notes, initial and subsequent vitals signs, including pulse oximetry, reviewed and interpreted by myself.  Vitals:   08/22/22 0420 08/22/22 0421 08/22/22 0424  BP:  (!) 155/77   Pulse:  72   Resp:  18   Temp:   (!) 97.1 F (36.2 C)  TempSrc:   Axillary  SpO2:  98%   Weight: 75.8 kg    Height: 5\' 9"  (1.753 m)     Medications  sodium chloride (PF) 0.9 % injection (has no administration in time range)  0.9 %  sodium chloride infusion ( Intravenous New Bag/Given 08/22/22 0619)  iohexol (OMNIPAQUE) 350 MG/ML injection 80 mL (80 mLs Intravenous Contrast Given 08/22/22 0636)    6:11 AM Left the patient admitted for lower GI bleed.  He is not anemic and is not bleeding heavily at the present time.  Will we will have him admitted to the hospitalist service.  In the meantime we will order a GI bleed angio study.  His poor renal function is within values typical for this patient in the past.  6:26 AM Dr. Claria Dice to admit to hospitalist service.  PROCEDURES  Procedures   ED DIAGNOSES     ICD-10-CM   1. Lower GI bleed  K92.2          Meline Russaw, MD 08/22/22 [phone]

## 2022-08-22 NOTE — Progress Notes (Signed)
Daniel Woodard is a 89 y.o. male with a hx of HFpEF,  dual-chamber biventricular pacemaker, HLD, T2DM, CKD stage III, nonobstructive CAD, PAD (status post left carotid endarterectomy 2001, chronic occlusion of left subclavian artery), and GOLD II/COPD. He presents with gross bright red blood with clots occurring this a.m.  Patient's hemoglobin is stable at 17.5.  Creatinine stable at 1.65.  Admission ordered.  Serial H&H's initiated

## 2022-08-22 NOTE — H&P (Signed)
History and Physical    Patient: Daniel Woodard [id number] DOB: [birth year] DOA: 08/22/2022 DOS: the patient was seen and examined on 08/22/2022 PCP: Biagio Borg, MD  Patient coming from: Home  Chief Complaint:  Chief Complaint  Patient presents with   Rectal Bleeding   HPI: Daniel Woodard is a 89 y.o. male with medical history significant of allergic rhinitis, atrial fibrillation, MAC bacteremia, BPH, bradycardia, carotid artery occlusion, colon polyps, complete heart block, permanent pacemaker placement, COPD, depression, type 2 diabetes, colon diverticulosis, hyperlipidemia, hypertension, PVD, history of pneumonia, history of herpes zoster, perirectal abscess in January 2023 requiring I&D who presented to the emergency department with rectal bleeding since this morning around 0300 after 2 days of double episodes of diarrhea.   No abdominal pain, nausea, emesis,  constipation, melena.  He complained of rectal pain several weeks ago, but has not had it recently.  However, his appetite is decreased and he has lost 8 pounds in the last few weeks. He denied fever, chills, rhinorrhea, sore throat, wheezing or hemoptysis.  No chest pain, palpitations, diaphoresis, PND, orthopnea or pitting edema of the lower extremities. No flank pain, dysuria, frequency or hematuria.  No polyuria, polydipsia, polyphagia or blurred vision.   Lab work: CBC showed a white count 9.3, hemoglobin 17.5 g/dL and platelets 207.  Follow-up hemoglobin has been 14.6 and 13.9 g/dL.  BMP showed a sodium 138, potassium 3.4, chloride 95 and CO2 of 26 mmol/L with an anion gap of 17.  Glucose 92, BUN 33, creatinine 1.65 and calcium 8.4 mg/dL.  Imaging: CTA GI bleed with no evidence for active bacterial phase contrast extravasation into the gastric lumen, duodenum small bowel or colon.  There is a thin U-shaped collar of fluid density with peripheral rim enhancement (perirectal abscess, posterior to an output in the low rectum/anus  over about 180 degrees arc with an associated ring-enhancing fistula from the left side of the lower rectum/anus down to the left skin surface below the intergluteal fold.  Prominent calcific plaque at the ostium of the SMA with probable flow-limiting stenosis.  Timing for a 5 mm ulcerated plaque or penetrating ulcer anterior wall abdominal aorta just inferior to the IMA origin.  Advance sigmoid diverticular disease with inspissated high density material and numerous sigmoid colon diverticuli.  Left lower lobe collapse with small to moderate bilateral pleural effusions, left greater than right.  Subtle nodularity of the liver raises the question of cirrhosis.  Diffuse body wall edema.  Aortic atherosclerosis.  ED course: Initial vital signs were temperature 97.1 F, pulse 72, respiration 18, BP 155/77 mmHg O2 sat 98% on room air.   Review of Systems: As mentioned in the history of present illness. All other systems reviewed and are negative. Past Medical History:  Diagnosis Date   ALLERGIC RHINITIS 04/09/2007   Atrial fibrillation ([place])    BACTEREMIA, 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   BENIGN PROSTATIC HYPERTROPHY 04/09/2007   Blood transfusion without reported diagnosis    really not sure thinks 30-40 years ago   BRADYCARDIA 05/11/2009   Carotid artery occlusion    COLONIC POLYPS, HX OF 04/09/2007   ADENOMATOUS POLYPS 2004,2008   Complete heart block ([place])    COPD 04/09/2007   Cough 03/27/2008   DEPRESSION 04/09/2007   DIABETES MELLITUS, TYPE II 10/09/2007   DIVERTICULOSIS, COLON 04/09/2007   DIZZINESS 10/06/2008   GLUCOSE INTOLERANCE 04/09/2007   HYPERLIPIDEMIA 03/27/2007   HYPERTENSION 03/27/2007   Kidney stone    PERIPHERAL VASCULAR DISEASE 03/27/2007  PNEUMONIA ORGANISM NOS 11/08/2007   SHINGLES, HX OF 04/09/2007   SHOULDER PAIN, BILATERAL 08/01/2007   Ulcer    40 years ago   Past Surgical History:  Procedure Laterality Date   APPENDECTOMY     89 years old   BIV  UPGRADE N/A 08/14/2020   Procedure: BIV PPM UPGRADE;  Surgeon: Evans Lance, MD;  Location: [facility];  Service: Cardiovascular;  Laterality: N/A;   CAROTID ENDARTERECTOMY  5/08   left   CARPAL TUNNEL RELEASE     bilateral   COLONOSCOPY     ESOPHAGOGASTRODUODENOSCOPY  06/07/2004   HEMORRHOID BANDING  1990s   INCISION AND DRAINAGE ABSCESS N/A 06/08/2021   Procedure: INCISION AND DRAINAGE ABSCESS;  Surgeon: Kieth Brightly Arta Bruce, MD;  Location: [facility];  Service: General;  Laterality: N/A;   IR RADIOLOGIST EVAL & MGMT  08/05/2022   LEFT HEART CATH AND CORONARY ANGIOGRAPHY N/A 06/02/2020   Procedure: LEFT HEART CATH AND CORONARY ANGIOGRAPHY;  Surgeon: Jettie Booze, MD;  Location: [facility];  Service: Cardiovascular;  Laterality: N/A;   PACEMAKER INSERTION  07/11/2013   MDT ADDRL1 pacemaker implanted by Dr Lovena Le for complete heart block   PERMANENT PACEMAKER INSERTION N/A 07/11/2013   Procedure: PERMANENT PACEMAKER INSERTION;  Surgeon: Evans Lance, MD;  Location: [facility];  Service: Cardiovascular;  Laterality: N/A;   s/p PUD surgury     ? partial gastrectomy   Social History:  reports that he quit smoking about 23 years ago. His smoking use included cigarettes. He has a 50.00 pack-year smoking history. He has never used smokeless tobacco. He reports that he does not drink alcohol and does not use drugs.  Allergies  Allergen Reactions   Ace Inhibitors Cough        Family History  Problem Relation Age of Onset   Hypertension Father    Ulcers Father    Hypertension Mother    Hypertension Sister    Peripheral vascular disease Brother    Hypertension Sister    Ulcers Sister        Ulcers   Hypertension Sister    Lung cancer Sister        smoked   Heart disease Sister        Carotid    Hypertension Sister    Heart disease Other        Cardiovascular disorder, CHF   Stroke Other        1st degree relative male and male   Colon cancer Neg Hx    Prior  to Admission medications   Medication Sig Start Date End Date Taking? Authorizing Provider  albuterol (VENTOLIN HFA) 108 (90 Base) MCG/ACT inhaler Inhale 2 puffs into the lungs every 6 (six) hours as needed for wheezing or shortness of breath. Patient not taking: Reported on 06/02/2022 04/08/22   Biagio Borg, MD  ASPIRIN 81 PO Take 81 mg by mouth daily. Patient not taking: Reported on 06/02/2022    [provider]  Coenzyme Q10 200 MG capsule Take by mouth. 07/17/15   [provider]  empagliflozin (JARDIANCE) 10 MG TABS tablet Take 1 tablet (10 mg total) by mouth daily before breakfast. 06/23/22   Croitoru, Mihai, MD  furosemide (LASIX) 40 MG tablet Take 1 tablet (40 mg total) by mouth daily. 12/27/21   Evans Lance, MD  levothyroxine (SYNTHROID) 50 MCG tablet Take 1 tablet (50 mcg total) by mouth daily. 08/02/22   Biagio Borg,  MD  Magnesium 400 MG TABS Take 400 mg by mouth daily. Patient not taking: Reported on 08/02/2022    [provider]  metoprolol succinate (TOPROL-XL) 25 MG 24 hr tablet Take 1/2 (one-half) tablet by mouth once daily 06/03/22   Biagio Borg, MD  omeprazole (PRILOSEC) 20 MG capsule Take 1 capsule by mouth once daily 11/19/21   Biagio Borg, MD  psyllium (METAMUCIL) 58.6 % packet Take 1 packet by mouth as needed. Patient not taking: Reported on 07/07/2022    [provider]  QUERCETIN PO Take 3 tablets by mouth 2 (two) times daily. With Vitamin C    [provider]  rosuvastatin (CRESTOR) 20 MG tablet Take 1 tablet (20 mg total) by mouth at bedtime. 06/10/21   Swayze, Ava, DO  traZODone (DESYREL) 50 MG tablet Take 1 tablet (50 mg total) by mouth at bedtime. Patient not taking: Reported on 07/07/2022 04/08/22   Biagio Borg, MD  VITAMIN D PO Take 5,000 Units by mouth daily.    [provider]  vitamin k 100 MCG tablet Take 100 mcg by mouth daily.    [provider]  zinc gluconate 50 MG tablet Take 50 mg by mouth 2 (two)  times daily.    [provider]    Physical Exam: Vitals:   08/22/22 0420 08/22/22 0421 08/22/22 0424 08/22/22 0700  BP:  (!) 155/77  (!) 142/66  Pulse:  72  73  Resp:  18  13  Temp:   (!) 97.1 F (36.2 C)   TempSrc:   Axillary   SpO2:  98%  91%  Weight: 75.8 kg     Height: 5\' 9"  (1.753 m)      Physical Exam Vitals and nursing note reviewed.  Constitutional:      Appearance: Normal appearance.  HENT:     Head: Normocephalic.     Nose: No rhinorrhea.     Mouth/Throat:     Mouth: Mucous membranes are moist.  Eyes:     General: No scleral icterus.    Pupils: Pupils are equal, round, and reactive to light.  Cardiovascular:     Rate and Rhythm: Normal rate and regular rhythm.  Pulmonary:     Effort: Pulmonary effort is normal.  Abdominal:     Tenderness: There is no guarding.  Musculoskeletal:     Cervical back: Neck supple.     Right lower leg: No edema.     Left lower leg: No edema.  Skin:    General: Skin is warm and dry.  Neurological:     General: No focal deficit present.     Mental Status: He is alert and oriented to person, place, and time.  Psychiatric:        Mood and Affect: Mood normal.        Behavior: Behavior normal.        Data Reviewed:  Results are pending, will review when available.  06/2022 echoca IMPRESSIONS    1. Left ventricular ejection fraction, by estimation, is 30 to 35%. The  left ventricle has moderately decreased function. The left ventricle  demonstrates global hypokinesis with paradoxical septal motion in the  setting of RV pacing. There is mild  concentric left ventricular hypertrophy. Diastolic function indeterminant  due to severe MAC. Elevated left atrial pressure.   2. The aortic valve is calcified. There is severe calcifcation of the  aortic valve. There is severe thickening of the aortic valve. Aortic valve  regurgitation is mild. There is moderate-to-severe low flow, low gradient  aortic stenosis. Aortic  valve  area, by VTI measures 1.08 cm. Aortic valve mean gradient measures 9.0  mmHg. Aortic valve Vmax measures 2.12 m/s. DI 0.34, SVi 22cc/m2.   3. Right ventricular systolic function is mildly reduced. The right  ventricular size is mildly enlarged.   4. The mitral valve is degenerative. Moderate mitral valve regurgitation.  Mild mitral stenosis. Severe mitral annular calcification.   5. Tricuspid valve regurgitation is mild to moderate.   6. Left atrial size was mildly dilated.   7. Right atrial size was mildly dilated.   Assessment and Plan: Principal Problem:   Rectal bleeding Admit to stepdown/inpatient. Keep NPO for now. Continue IV fluids. Continue pantoprazole 40 mg daily. Monitor H&H. Transfuse as needed. GI consult greatly appreciated.  Active Problems:   Perirectal abscess General surgery consult appreciated. Continue IV antibiotics. No pain at this time. Continue local care.    Hyperlipidemia   Aortic atherosclerosis (HCC) Resume statin once cleared for oral intake.    Essential hypertension Antihypertensives have been held. Continue to monitor blood pressure.    COPD GOLD II / AB component  No symptoms at this time. Supplemental oxygen as needed. Bronchodilators as needed.    Coronary artery disease involving native  coronary artery of native heart without angina pectoris Currently NPO. Holding statin. Blood pressures are soft Holding beta-blocker as well.    Chronic systolic CHF (congestive heart failure) (HCC)  No signs of decompensation. BP soft.  Hold diuretic and beta-blocker.    Stage IIIb CKD Monitor renal function electrolytes.     Advance Care Planning:   Code Status: Full Code   Consults: [facility] (Dr. Lyndel Safe). General surgery (Dr. Donne Hazel).  Family Communication:   Severity of Illness: The appropriate patient status for this patient is OBSERVATION. Observation status is judged to be reasonable and necessary in order to  provide the required intensity of service to ensure the patient's safety. The patient's presenting symptoms, physical exam findings, and initial radiographic and laboratory data in the context of their medical condition is felt to place them at decreased risk for further clinical deterioration. Furthermore, it is anticipated that the patient will be medically stable for discharge from the hospital within 2 midnights of admission.   Author: [name], MD 08/22/2022 8:18 AM  For on call review [email or web address].   This document was prepared using Dragon voice recognition software and may contain some unintended transcription errors.

## 2022-08-22 NOTE — Consult Note (Signed)
Daniel Woodard 1929-12-28  [id number].    Requesting MD: [name], MD Chief Complaint/Reason for Consult: rectal bleeding with concern for possible perirectal abscess/fistula   HPI:  Patient is a 89 year old male who presented to the ED early this AM with BRBPR and clots noted starting around 3 AM. Reports 48 hours of loose, non-bloody stools, and then developed BRBPR this morning around 0300.  He states he had a moderate amount of bright red blood on the toilet paper and then a trickle of blood, which is what made he come in for evaluation. He denies rectal pain or fever. His daughters at bedside say that he complained of buttock pain 2-3 weeks ago but stopped complaining about this and has not mentioned it in multiple days. They also report decreased PO intake for about 3 weeks. He has had rectal bleeding previously that required transfusion. Hx of perirectal abscess s/p I&D in 06/08/21 of last year and last colonoscopy 2013 with polyps and internal hemorrhoids. Patient had penrose placed in the OR in 2023, no masses noted on exam at that time. Patient recovered uneventfully from this and penrose drain was removed in the office 06/17/21. Denies recent rectal pain or drainage leading up to this AM and reports typically having a semi-formed stool most days. Reports 48 hours of loose, non-bloody stools, and then developed BRBPR this morning around 0300.  PMH otherwise significant for COPD, CHF, Pacemaker present , T2DM, HTN, HLD and Peripheral vascular disease. Takes an 81 mg ASA at baseline but no other blood thinners. His daughter, Helene Kelp is his POA and present at bedside this AM.   ROS: As above Review of Systems  All other systems reviewed and are negative.   Family History  Problem Relation Age of Onset   Hypertension Father    Ulcers Father    Hypertension Mother    Hypertension Sister    Peripheral vascular disease Brother    Hypertension Sister    Ulcers Sister         Ulcers   Hypertension Sister    Lung cancer Sister        smoked   Heart disease Sister        Carotid    Hypertension Sister    Heart disease Other        Cardiovascular disorder, CHF   Stroke Other        1st degree relative male and male   Colon cancer Neg Hx     Past Medical History:  Diagnosis Date   ALLERGIC RHINITIS 04/09/2007   Atrial fibrillation ([place])    BACTEREMIA, 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   BENIGN PROSTATIC HYPERTROPHY 04/09/2007   Blood transfusion without reported diagnosis    really not sure thinks 30-40 years ago   BRADYCARDIA 05/11/2009   Carotid artery occlusion    COLONIC POLYPS, HX OF 04/09/2007   ADENOMATOUS POLYPS 2004,2008   Complete heart block ([facility])    COPD 04/09/2007   Cough 03/27/2008   DEPRESSION 04/09/2007   DIABETES MELLITUS, TYPE II 10/09/2007   DIVERTICULOSIS, COLON 04/09/2007   DIZZINESS 10/06/2008   GLUCOSE INTOLERANCE 04/09/2007   HYPERLIPIDEMIA 03/27/2007   HYPERTENSION 03/27/2007   Kidney stone    PERIPHERAL VASCULAR DISEASE 03/27/2007   PNEUMONIA ORGANISM NOS 11/08/2007   SHINGLES, HX OF 04/09/2007   SHOULDER PAIN, BILATERAL 08/01/2007   Ulcer    40 years ago    Past Surgical History:  Procedure Laterality Date  APPENDECTOMY     89 years old   BIV UPGRADE N/A 08/14/2020   Procedure: BIV PPM UPGRADE;  Surgeon: Evans Lance, MD;  Location: [facility];  Service: Cardiovascular;  Laterality: N/A;   CAROTID ENDARTERECTOMY  5/08   left   CARPAL TUNNEL RELEASE     bilateral   COLONOSCOPY     ESOPHAGOGASTRODUODENOSCOPY  06/07/2004   HEMORRHOID BANDING  1990s   INCISION AND DRAINAGE ABSCESS N/A 06/08/2021   Procedure: INCISION AND DRAINAGE ABSCESS;  Surgeon: Kieth Brightly Arta Bruce, MD;  Location: [facility];  Service: General;  Laterality: N/A;   IR RADIOLOGIST EVAL & MGMT  08/05/2022   LEFT HEART CATH AND CORONARY ANGIOGRAPHY N/A 06/02/2020   Procedure: LEFT HEART CATH AND CORONARY ANGIOGRAPHY;  Surgeon:  Jettie Booze, MD;  Location: [facility];  Service: Cardiovascular;  Laterality: N/A;   PACEMAKER INSERTION  07/11/2013   MDT ADDRL1 pacemaker implanted by Dr Lovena Le for complete heart block   PERMANENT PACEMAKER INSERTION N/A 07/11/2013   Procedure: PERMANENT PACEMAKER INSERTION;  Surgeon: Evans Lance, MD;  Location: [facility];  Service: Cardiovascular;  Laterality: N/A;   s/p PUD surgury     ? partial gastrectomy    Social History:  reports that he quit smoking about 23 years ago. His smoking use included cigarettes. He has a 50.00 pack-year smoking history. He has never used smokeless tobacco. He reports that he does not drink alcohol and does not use drugs.  Allergies:  Allergies  Allergen Reactions   Ace Inhibitors Cough         Medications Prior to Admission  Medication Sig Dispense Refill   albuterol (VENTOLIN HFA) 108 (90 Base) MCG/ACT inhaler Inhale 2 puffs into the lungs every 6 (six) hours as needed for wheezing or shortness of breath. (Patient not taking: Reported on 06/02/2022) 8 g 11   ASPIRIN 81 PO Take 81 mg by mouth daily. (Patient not taking: Reported on 06/02/2022)     Coenzyme Q10 200 MG capsule Take by mouth.     empagliflozin (JARDIANCE) 10 MG TABS tablet Take 1 tablet (10 mg total) by mouth daily before breakfast. 14 tablet 0   furosemide (LASIX) 40 MG tablet Take 1 tablet (40 mg total) by mouth daily. 90 tablet 3   levothyroxine (SYNTHROID) 50 MCG tablet Take 1 tablet (50 mcg total) by mouth daily. 90 tablet 3   Magnesium 400 MG TABS Take 400 mg by mouth daily. (Patient not taking: Reported on 08/02/2022)     metoprolol succinate (TOPROL-XL) 25 MG 24 hr tablet Take 1/2 (one-half) tablet by mouth once daily 45 tablet 0   omeprazole (PRILOSEC) 20 MG capsule Take 1 capsule by mouth once daily 90 capsule 3   psyllium (METAMUCIL) 58.6 % packet Take 1 packet by mouth as needed. (Patient not taking: Reported on 07/07/2022)     QUERCETIN PO Take 3 tablets by  mouth 2 (two) times daily. With Vitamin C     rosuvastatin (CRESTOR) 20 MG tablet Take 1 tablet (20 mg total) by mouth at bedtime. 30 tablet 0   traZODone (DESYREL) 50 MG tablet Take 1 tablet (50 mg total) by mouth at bedtime. (Patient not taking: Reported on 07/07/2022) 90 tablet 1   VITAMIN D PO Take 5,000 Units by mouth daily.     vitamin k 100 MCG tablet Take 100 mcg by mouth daily.     zinc gluconate 50 MG tablet Take 50 mg by mouth  2 (two) times daily.       Physical Exam: Blood pressure 110/77, pulse 72, temperature (!) 97.4 F (36.3 C), temperature source Axillary, resp. rate 13, height 6\' 1"  (1.854 m), weight 69 kg, SpO2 94 %. General: Pleasant white male, elderly, chronically ill appearing, laying on hospital bed, appears stated age, NAD. HEENT: head -normocephalic, atraumatic; Eyes: PERRLA Neck- Trachea is midline CV- RRR, normal S1/S2, no M/R/G, mild lower extremity edema with venous stasis changes. Pulm- breathing is non-labored ORA Abd- soft, NT/ND, appropriate bowel sounds in 4 quadrants, no masses, hernias, or organomegaly. GU- there is what appears to be a mature fistula tract to the left buttock that is oozing blood. DRE without severe pain or fluctuance, there is no cellulitis or appreciable induration on exam.  MSK- UE/LE symmetrical, no cyanosis, clubbing, or edema. Neuro- non-focal exam, gait not assessed. Psych- Alert and Oriented x3 with appropriate affect Skin: warm and dry, no rashes or lesions   Results for orders placed or performed during the hospital encounter of 08/22/22 (from the past 48 hour(s))  CBC with Differential     Status: Abnormal   Collection Time: 08/22/22  4:20 AM  Result Value Ref Range   WBC 9.3 4.0 - 10.5 K/uL   RBC 7.21 (H) 4.22 - 5.81 MIL/uL   Hemoglobin 17.5 (H) 13.0 - 17.0 g/dL   HCT 57.0 (H) 39.0 - 52.0 %   MCV 79.1 (L) 80.0 - 100.0 fL   MCH 24.3 (L) 26.0 - 34.0 pg   MCHC 30.7 30.0 - 36.0 g/dL   RDW 23.7 (H) 11.5 - 15.5 %    Platelets 207 150 - 400 K/uL   nRBC 0.0 0.0 - 0.2 %   Neutrophils Relative % 84 %   Neutro Abs 7.7 1.7 - 7.7 K/uL   Lymphocytes Relative 8 %   Lymphs Abs 0.8 0.7 - 4.0 K/uL   Monocytes Relative 8 %   Monocytes Absolute 0.7 0.1 - 1.0 K/uL   Eosinophils Relative 0 %   Eosinophils Absolute 0.0 0.0 - 0.5 K/uL   Basophils Relative 0 %   Basophils Absolute 0.0 0.0 - 0.1 K/uL   Immature Granulocytes 0 %   Abs Immature Granulocytes 0.03 0.00 - 0.07 K/uL   Tear Drop Cells PRESENT    Ovalocytes PRESENT     Comment: Performed at [facility], [place] [address], [place] [employer], [place] [id number]  Basic metabolic panel     Status: Abnormal   Collection Time: 08/22/22  4:20 AM  Result Value Ref Range   Sodium 138 135 - 145 mmol/L   Potassium 3.4 (L) 3.5 - 5.1 mmol/L   Chloride 95 (L) 98 - 111 mmol/L   CO2 26 22 - 32 mmol/L   Glucose, Bld 92 70 - 99 mg/dL    Comment: Glucose reference range applies only to samples taken after fasting for at least 8 hours.   BUN 33 (H) 8 - 23 mg/dL   Creatinine, Ser 1.65 (H) 0.61 - 1.24 mg/dL   Calcium 8.4 (L) 8.9 - 10.3 mg/dL   GFR, Estimated 39 (L) >60 mL/min    Comment: (NOTE) Calculated using the CKD-EPI Creatinine Equation (2021)    Anion gap 17 (H) 5 - 15    Comment: Performed at [facility], [place] [address] [place], [place] [address]  Type and screen [place]     Status: None   Collection Time: 08/22/22  4:20 AM  Result Value Ref  Range   ABO/RH(D) O POS    Antibody Screen NEG    Sample Expiration      08/25/2022,2359 Performed at [facility], [place] [address], [place], [place] [address]   Hemoglobin and hematocrit, blood     Status: None   Collection Time: 08/22/22  6:46 AM  Result Value Ref Range   Hemoglobin 14.6 13.0 - 17.0 g/dL   HCT 47.5 39.0 - 52.0 %    Comment: Performed at [facility], [place] [address], [place]  [address]   CT ANGIO GI  BLEED  Result Date: 08/22/2022 CLINICAL DATA:  Lower GI bleed.  Rectal bleeding. EXAM: CTA ABDOMEN AND PELVIS WITHOUT AND WITH CONTRAST TECHNIQUE: Multidetector CT imaging of the abdomen and pelvis was performed using the standard protocol during bolus administration of intravenous contrast. Multiplanar reconstructed images and MIPs were obtained and reviewed to evaluate the vascular anatomy. RADIATION DOSE REDUCTION: This exam was performed according to the departmental dose-optimization program which includes automated exposure control, adjustment of the mA and/or kV according to patient size and/or use of iterative reconstruction technique. CONTRAST:  30mL OMNIPAQUE IOHEXOL 350 MG/ML SOLN COMPARISON:  Abdomen and pelvis CT 06/07/2021 FINDINGS: VASCULAR Aorta: Normal caliber aorta without aneurysm, dissection, vasculitis or significant stenosis. Tiny 4-5 mm ulcerated plaque or penetrating ulcer noted anterior wall abdominal aorta just inferior to the IMA origin (axial 116/5). Prominent atherosclerotic calcification and mural thrombus noted. Celiac: Patent without evidence of aneurysm, dissection, vasculitis or significant stenosis. SMA: Prominent calcific plaque at the ostium with probable flow limiting stenosis (see axial 75/5 and coronal 59/12). Renals: Prominent calcific plaque at the proximal renal arteries bilaterally without definite features of flow limiting stenosis. IMA: Patent without evidence of aneurysm, dissection, vasculitis or significant stenosis. Inflow: Patent without evidence of aneurysm, dissection, vasculitis or significant stenosis. Proximal Outflow: Choose 1 Veins: No obvious venous abnormality within the limitations of this arterial phase study. Review of the MIP images confirms the above findings. NON-VASCULAR Lower chest: Left lower lobe collapse noted with small to moderate bilateral pleural effusions, left greater than right. Hepatobiliary: Subtle nodularity of liver contour raises  the question of cirrhosis. No evidence for focal arterial phase hyperenhancement within the parenchyma. There is no evidence for gallstones, gallbladder wall thickening, or pericholecystic fluid. No intrahepatic or extrahepatic biliary dilation. Pancreas: No focal mass lesion. No dilatation of the main duct. No intraparenchymal cyst. No peripancreatic edema. Spleen: No splenomegaly. No focal mass lesion. Adrenals/Urinary Tract: Bilateral adrenal thickening without a discrete nodule or mass. Left kidney unremarkable. Tiny hypodensity in the interpolar right kidney is likely a simple cyst. No followup imaging is recommended. No evidence for hydroureter. The urinary bladder appears normal for the degree of distention. Stomach/Bowel: Unremarkable. No evidence for arterial phase contrast extravasation into the gastric lumen. No evidence for active hemorrhage into the duodenum. No findings to suggest arterial phase extravasation of contrast in the small bowel or colon. Advanced diverticular disease is noted in the sigmoid colon with inspissated high density material in numerous sigmoid colon diverticuli. A thin collar of fluid density measuring only a bowel 5 mm in diameter with peripheral rim enhancement is seen posterior to the low rectum/anus tracking anteriorly on both sides. This is associated with a rim enhancing fluid collection that tracks caudally in the left ischial anal fat to the left skin surface of the intergluteal fold (see images 89-96 of series 7 and coronal images 82-104 of series 15). Lymphatic: There is no gastrohepatic  or hepatoduodenal ligament lymphadenopathy. No retroperitoneal or mesenteric lymphadenopathy. No pelvic sidewall lymphadenopathy. Reproductive: The prostate gland and seminal vesicles are unremarkable. Other: No intraperitoneal free fluid. Musculoskeletal: Diffuse body wall edema evident. No worrisome lytic or sclerotic osseous abnormality. IMPRESSION: 1. No evidence for active arterial  phase contrast extravasation into the gastric lumen, duodenum, small bowel, or colon. 2. There is a thin U shaped collar of fluid density with peripheral rim enhancement (perirectal abscess) posterior to and abutting the low rectum/anus over about 180 degree arc with an associated rim enhancing fistula from the left side of the low rectum/anus down to the left skin surface of the intergluteal fold. 3. Prominent calcific plaque at the ostium of the SMA with probable flow limiting stenosis. 4. Tiny 4-5 mm ulcerated plaque or penetrating ulcer anterior wall abdominal aorta just inferior to the IMA origin. 5. Advanced sigmoid diverticular disease with inspissated high density material in numerous sigmoid colon diverticuli. 6. Left lower lobe collapse with small to moderate bilateral pleural effusions, left greater than right. 7. Subtle nodularity of liver contour raises the question of cirrhosis. 8. Diffuse body wall edema. 9.  Aortic Atherosclerosis (ICD10-I70.0). Electronically Signed   By: Misty Stanley M.D.   On: 08/22/2022 06:59      Assessment/Plan Bright red blood per rectum  Perirectal abscess, recurrent  - CTA this AM with thin U shaped collar of fluid density with peripheral rim enhancement posterior to low rectum and anus with a fistula  - no leukocytosis and pt afebrile  - hgb stable at 14.6 from 14.7 on 3/5 - I do not see an urgent need for EUA or further I&D. The abscess on CT is small and not appreciable on physical exam. Recommend IV abx for this. The fistula tract is open so any purulence should ideally be able to drain through this tract.  - appreciate GI seeing, I think flex sig to localize the bleeding would be a good idea.    FEN - NPO, IVF per [facility] VTE - SCDs, no chemical prophylaxis in setting of rectal bleeding  ID - start zosyn per pharmacy Admit - [facility]   COPD CHF Pacemaker present  T2DM HTN HLD Peripheral vascular disease   I reviewed nursing notes, Consultant GI  notes, hospitalist notes, last 24 h vitals and pain scores, last 48 h intake and output, last 24 h labs and trends, and last 24 h imaging results.  Jill Alexanders, [facility] 08/22/2022, 10:39 AM Please see Amion for pager number during day hours 7:00am-4:30pm or 7:00am -11:30am on weekends

## 2022-08-22 NOTE — H&P (View-Only) (Signed)
Referring Provider: Dr. Tennis Must Primary Care Physician:  Biagio Borg, MD Primary Gastroenterologist:  Previously Dr. Deatra Ina, [facility]  Reason for Consultation: Painless hematochezia  HPI: Daniel Woodard is a 89 y.o. male with a past medical history of depression, hypertension, hyperlipidemia, coronary artery disease, chronic systolic CHF, paroxysmal atrial fibrillation not on anticoagulation, complete heart block, dual-chamber biventricular pacemaker, peripheral arterial disease s/p left carotid endarterectomy 2001, chronic occlusion of left subclavian artery, diabetes mellitus type 2, CKD stage III, COPD, GERD, colon polyps and perirectal/perianal abscess s/p I & D  06/08/2021.  Remote appendectomy secondary to ruptured appendix and gastric surgery for ulcer disease.  He presented to the ED this morning with bright red hematochezia with blood clots. Labs in the ED showed a hemoglobin level of 17.5 (Hg 14.7 on 08/02/2022).  Hematocrit 57.0.  MCV 79.1.  Platelet 207.  Sodium 138.  Potassium 3.4.  BUN 33.  Creatinine 1.65 ( Cr 1.99 on 08/02/2022).  Calcium 8.4.  Abdominal/pelvic CTA negative for active GI bleed but showed a U-shaped collar fluid density concerning for a perirectal abscess posterior to and abutting the low rectum/anus with an associated rim-enhancing fistula from the left side of the low rectum/anus down to the left skin surface of the intergluteal fold. Other CTA findings included prominent calcific plaque at the ostium of the SMA with probable stenosis, tiny 4-5 mm ulcerated plaque or penetrating ulcer anterior wall abdominal aorta just inferior to the IMA origin, left lower lobe collapse with small to moderate bilateral pleural effusions (left greater than right), subtle nodularity of liver contour suggestive of possible cirrhosis, diffuse body wall edema and aortic atherosclerosis.   Repeat hemoglobin 14.6.   He was admitted to the hospital 06/07/2021 due to having rectal pain and  constipation.  He was diagnosed with a perirectal/perianal abscess treated with IV antibiotics s/p I & D by colorectal surgery 06/08/2021 without postoperative complications. He was subsequently discharged home 06/10/2021.  He denies having any recent anorectal pain or discharge.  He typically passes a normal formed stool most days, but, for the past 3 to 4 days he has passed round of loose stools.  Early this morning, he passed red blood per the rectum as noted above without stool.  No abdominal pain.  No aspirin or blood thinners. His last colonoscopy was in 2013 which identified a small tubular adenomatous polyp removed from the transverse colon.  No further colonoscopies were recommended due to age.  No GERD symptoms on Omeprazole 20 mg daily.  His daughter Milbert Coulter is at the bed side.   PAST GI PROCEDURES:  Colonoscopy 05/17/2012 by Dr. Sharion Balloon polyp measuring 3 mm in size in the transverse colon, 2 mm polyp in the descending colon were removed severe diverticulosis in the sigmoid colon Internal hemorrhoids Surgical [P], ascending, transverse, descending, polyp - TUBULAR ADENOMAS (X2); NEGATIVE FOR HIGH GRADE DYSPLASIA OR MALIGNANCY. - FRAGMENTS OF MUCOSAL PROLAPSE POLYP (X1).  Colonoscopy 05/10/2007: 3 mm adenomatous polyp removed from the sigmoid colon Diverticulosis, Internal hemorrhoids    ECHO 06/30/2022: IMPRESSIONS Left ventricular ejection fraction, by estimation, is 30 to 35%. The left ventricle has moderately decreased function. The left ventricle demonstrates global hypokinesis with paradoxical septal motion in the setting of RV pacing. There is mild concentric left ventricular hypertrophy. Diastolic function indeterminant due to severe MAC. Elevated left atrial pressure. 1. The aortic valve is calcified. There is severe calcifcation of the aortic valve. There is severe thickening of the aortic valve. Aortic  valve regurgitation is mild. There is moderate-to-severe low flow,  low gradient aortic stenosis. Aortic valve area, by VTI measures 1.08 cm. Aortic valve mean gradient measures 9.0 mmHg. Aortic valve Vmax measures 2.12 m/s. DI 0.34, SVi 22cc/m2. 2. Right ventricular systolic function is mildly reduced. The right ventricular size is mildly enlarged. 3. The mitral valve is degenerative. Moderate mitral valve regurgitation. Mild mitral stenosis. Severe mitral annular calcification. 4. 5. Tricuspid valve regurgitation is mild to moderate. 6. Left atrial size was mildly dilated. 7. Right atrial size was mildly dilated.   Past Medical History:  Diagnosis Date   ALLERGIC RHINITIS 04/09/2007   Atrial fibrillation ([place])    BACTEREMIA, 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   BENIGN PROSTATIC HYPERTROPHY 04/09/2007   Blood transfusion without reported diagnosis    really not sure thinks 30-40 years ago   BRADYCARDIA 05/11/2009   Carotid artery occlusion    COLONIC POLYPS, HX OF 04/09/2007   ADENOMATOUS POLYPS 2004,2008   Complete heart block (HCC)    COPD 04/09/2007   Cough 03/27/2008   DEPRESSION 04/09/2007   DIABETES MELLITUS, TYPE II 10/09/2007   DIVERTICULOSIS, COLON 04/09/2007   DIZZINESS 10/06/2008   GLUCOSE INTOLERANCE 04/09/2007   HYPERLIPIDEMIA 03/27/2007   HYPERTENSION 03/27/2007   Kidney stone    PERIPHERAL VASCULAR DISEASE 03/27/2007   PNEUMONIA ORGANISM NOS 11/08/2007   SHINGLES, HX OF 04/09/2007   SHOULDER PAIN, BILATERAL 08/01/2007   Ulcer    40 years ago    Past Surgical History:  Procedure Laterality Date   APPENDECTOMY     89 years old   BIV UPGRADE N/A 08/14/2020   Procedure: BIV PPM UPGRADE;  Surgeon: Evans Lance, MD;  Location: [facility];  Service: Cardiovascular;  Laterality: N/A;   CAROTID ENDARTERECTOMY  5/08   left   CARPAL TUNNEL RELEASE     bilateral   COLONOSCOPY     ESOPHAGOGASTRODUODENOSCOPY  06/07/2004   HEMORRHOID BANDING  1990s   INCISION AND DRAINAGE ABSCESS N/A 06/08/2021   Procedure: INCISION AND  DRAINAGE ABSCESS;  Surgeon: Kieth Brightly Arta Bruce, MD;  Location: [facility];  Service: General;  Laterality: N/A;   IR RADIOLOGIST EVAL & MGMT  08/05/2022   LEFT HEART CATH AND CORONARY ANGIOGRAPHY N/A 06/02/2020   Procedure: LEFT HEART CATH AND CORONARY ANGIOGRAPHY;  Surgeon: Jettie Booze, MD;  Location: [facility];  Service: Cardiovascular;  Laterality: N/A;   PACEMAKER INSERTION  07/11/2013   MDT ADDRL1 pacemaker implanted by Dr Lovena Le for complete heart block   PERMANENT PACEMAKER INSERTION N/A 07/11/2013   Procedure: PERMANENT PACEMAKER INSERTION;  Surgeon: Evans Lance, MD;  [facility]: [facility];  Service: Cardiovascular;  Laterality: N/A;   s/p PUD surgury     ? partial gastrectomy    Prior to Admission medications   Medication Sig Start Date End Date Taking? Authorizing Provider  albuterol (VENTOLIN HFA) 108 (90 Base) MCG/ACT inhaler Inhale 2 puffs into the lungs every 6 (six) hours as needed for wheezing or shortness of breath. Patient not taking: Reported on 06/02/2022 04/08/22   Biagio Borg, MD  ASPIRIN 81 PO Take 81 mg by mouth daily. Patient not taking: Reported on 06/02/2022    [provider]  Coenzyme Q10 200 MG capsule Take by mouth. 07/17/15   [provider]  empagliflozin (JARDIANCE) 10 MG TABS tablet Take 1 tablet (10 mg total) by mouth daily before breakfast. 06/23/22   Croitoru, Mihai, MD  furosemide (LASIX) 40 MG tablet  Take 1 tablet (40 mg total) by mouth daily. 12/27/21   Evans Lance, MD  levothyroxine (SYNTHROID) 50 MCG tablet Take 1 tablet (50 mcg total) by mouth daily. 08/02/22   Biagio Borg, MD  Magnesium 400 MG TABS Take 400 mg by mouth daily. Patient not taking: Reported on 08/02/2022    [provider]  metoprolol succinate (TOPROL-XL) 25 MG 24 hr tablet Take 1/2 (one-half) tablet by mouth once daily 06/03/22   Biagio Borg, MD  omeprazole (PRILOSEC) 20 MG capsule Take 1 capsule by mouth once daily 11/19/21   Biagio Borg, MD   psyllium (METAMUCIL) 58.6 % packet Take 1 packet by mouth as needed. Patient not taking: Reported on 07/07/2022    [provider]  QUERCETIN PO Take 3 tablets by mouth 2 (two) times daily. With Vitamin C    [provider]  rosuvastatin (CRESTOR) 20 MG tablet Take 1 tablet (20 mg total) by mouth at bedtime. 06/10/21   Swayze, Ava, DO  traZODone (DESYREL) 50 MG tablet Take 1 tablet (50 mg total) by mouth at bedtime. Patient not taking: Reported on 07/07/2022 04/08/22   Biagio Borg, MD  VITAMIN D PO Take 5,000 Units by mouth daily.    [provider]  vitamin k 100 MCG tablet Take 100 mcg by mouth daily.    [provider]  zinc gluconate 50 MG tablet Take 50 mg by mouth 2 (two) times daily.    [provider]    Current Facility-Administered Medications  Medication Dose Route Frequency Provider Last Rate Last Admin   acetaminophen (TYLENOL) tablet 650 mg  650 mg Oral Q6H PRN [name], MD       Or   acetaminophen (TYLENOL) suppository 650 mg  650 mg Rectal Q6H PRN [name], MD       ondansetron [facility]) tablet 4 mg  4 mg Oral Q6H PRN [name], MD       Or   ondansetron [facility]) injection 4 mg  4 mg Intravenous Q6H PRN [name], MD       pantoprazole (PROTONIX) injection 40 mg  40 mg Intravenous Q24H [name], MD       sodium chloride (PF) 0.9 % injection            Current Outpatient Medications  Medication Sig Dispense Refill   albuterol (VENTOLIN HFA) 108 (90 Base) MCG/ACT inhaler Inhale 2 puffs into the lungs every 6 (six) hours as needed for wheezing or shortness of breath. (Patient not taking: Reported on 06/02/2022) 8 g 11   ASPIRIN 81 PO Take 81 mg by mouth daily. (Patient not taking: Reported on 06/02/2022)     Coenzyme Q10 200 MG capsule Take by mouth.     empagliflozin (JARDIANCE) 10 MG TABS tablet Take 1 tablet (10 mg total) by mouth daily before breakfast. 14 tablet 0   furosemide  (LASIX) 40 MG tablet Take 1 tablet (40 mg total) by mouth daily. 90 tablet 3   levothyroxine (SYNTHROID) 50 MCG tablet Take 1 tablet (50 mcg total) by mouth daily. 90 tablet 3   Magnesium 400 MG TABS Take 400 mg by mouth daily. (Patient not taking: Reported on 08/02/2022)     metoprolol succinate (TOPROL-XL) 25 MG 24 hr tablet Take 1/2 (one-half) tablet by mouth once daily 45 tablet 0   omeprazole (PRILOSEC) 20 MG capsule Take 1 capsule by mouth once daily 90 capsule 3  psyllium (METAMUCIL) 58.6 % packet Take 1 packet by mouth as needed. (Patient not taking: Reported on 07/07/2022)     QUERCETIN PO Take 3 tablets by mouth 2 (two) times daily. With Vitamin C     rosuvastatin (CRESTOR) 20 MG tablet Take 1 tablet (20 mg total) by mouth at bedtime. 30 tablet 0   traZODone (DESYREL) 50 MG tablet Take 1 tablet (50 mg total) by mouth at bedtime. (Patient not taking: Reported on 07/07/2022) 90 tablet 1   VITAMIN D PO Take 5,000 Units by mouth daily.     vitamin k 100 MCG tablet Take 100 mcg by mouth daily.     zinc gluconate 50 MG tablet Take 50 mg by mouth 2 (two) times daily.      Allergies as of 08/22/2022 - Review Complete 08/22/2022  Allergen Reaction Noted   Ace inhibitors Cough 04/09/2007    Family History  Problem Relation Age of Onset   Hypertension Father    Ulcers Father    Hypertension Mother    Hypertension Sister    Peripheral vascular disease Brother    Hypertension Sister    Ulcers Sister        Ulcers   Hypertension Sister    Lung cancer Sister        smoked   Heart disease Sister        Carotid    Hypertension Sister    Heart disease Other        Cardiovascular disorder, CHF   Stroke Other        1st degree relative male and male   Colon cancer Neg Hx     Social History   Socioeconomic History   Marital status: Widowed    Spouse name: Not on file   Number of children: 2   Years of education: Not on file   Highest education level: Not on file  Occupational  History   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  Tobacco Use   Smoking status: Former    Packs/day: 1.00    Years: 50.00    Additional pack years: 0.00    Total pack years: 50.00    Types: Cigarettes    Quit date: 08/20/1999    Years since quitting: 23.0   Smokeless tobacco: Never  Vaping Use   Vaping Use: Never used  Substance and Sexual Activity   Alcohol use: No    Alcohol/week: 0.0 standard drinks of alcohol   Drug use: No   Sexual activity: Never  Other Topics Concern   Not on file  Social History Narrative   Widowed; wife dies 09-15-2018   Daily caffeine    Social Determinants of Health   Financial Resource Strain: Low Risk  (02/25/2022)   Overall Financial Resource Strain (CARDIA)    Difficulty of Paying Living Expenses: Not hard at all  Food Insecurity: No Food Insecurity (02/25/2022)   Hunger Vital Sign    Worried About Running Out of Food in the Last Year: Never true    Ran Out of Food in the Last Year: Never true  Transportation Needs: No Transportation Needs (02/25/2022)   PRAPARE - Hydrologist (Medical): No    Lack of Transportation (Non-Medical): No  Physical Activity: Inactive (02/25/2022)   Exercise Vital Sign    Days of Exercise per Week: 0 days    Minutes of Exercise per Session: 0 min  Stress: No Stress Concern Present (02/25/2022)  [place]    Feeling of Stress : Not at all  Social Connections: Unknown (02/25/2022)   Social Connection and Isolation Panel [NHANES]    Frequency of Communication with Friends and Family: More than three times a week    Frequency of Social Gatherings with Friends and Family: More than three times a week    Attends Religious Services: Never    Marine scientist or Organizations: No    Attends Archivist Meetings: Never    Marital Status: Patient declined  Human resources officer  Violence: Not At Risk (02/25/2022)   Humiliation, Afraid, Rape, and Kick questionnaire    Fear of Current or Ex-Partner: No    Emotionally Abused: No    Physically Abused: No    Sexually Abused: No    Review of Systems: Gen: Denies fever, sweats or chills. No weight loss.  CV: Denies chest pain, palpitations or edema. Resp: Denies cough, shortness of breath of hemoptysis.  GI:See HPI.   GU : Denies urinary burning, blood in urine, increased urinary frequency or incontinence. MS: Denies joint pain, muscles aches or weakness. Derm: Denies rash, itchiness, skin lesions or unhealing ulcers. Psych: + Depression, mild confusion.  Heme: + Easy bruising.  Neuro:  Denies headaches, dizziness or paresthesias. Endo: + Diabetes and hypothyroidism.  Physical Exam: Vital signs in last 24 hours: Temp:  [97.1 F (36.2 C)] 97.1 F (36.2 C) (03/25 0424) Pulse Rate:  [72-73] 73 (03/25 0800) Resp:  [13-18] 15 (03/25 0800) BP: (139-155)/(66-77) 139/69 (03/25 0800) SpO2:  [91 %-98 %] 96 % (03/25 0800) Weight:  [75.8 kg] 75.8 kg (03/25 0420)   General: Alert 89 year old male in no acute distress. Head:  Normocephalic and atraumatic. Eyes:  No scleral icterus. Conjunctiva pink. Ears:  Normal auditory acuity. Nose:  No deformity, discharge or lesions. Mouth: Absent dentition.  No ulcers or lesions.  Neck:  Supple. No lymphadenopathy or thyromegaly.  Lungs: Breath sounds clear throughout. No wheezes, rhonchi or crackles.  Heart: Regular rate and rhythm, distant S1, S2.  No murmur. Abdomen: Soft, nondistended.  Positive bowel sounds all 4 quadrants.  Upper abdominal midline scar intact, lower midline abdominal scar intact.  No palpable mass.  No hepatosplenomegaly. Rectal: Active red blood oozing from left gluteal fistula.  Anal sphincter, limited internal rectal exam.  Light brown soft stool in the rectal vault.  Daughter and nursing tech at bedside time of exam.  Musculoskeletal:  Symmetrical  without gross deformities.  Pulses:  Normal pulses noted. Extremities: Bilateral lower extremities erythematous consistent with PAD, left heel Ace wrap intact.  No edema. Neurologic:  Alert and  oriented x 4. No focal deficits.  Skin: Left heel Ace wrap intact.  Bilateral lower extremities erythematous Psych:  Alert and cooperative. Normal mood and affect.  Intake/Output from previous day: No intake/output data recorded. Intake/Output this shift: No intake/output data recorded.  Lab Results: Recent Labs    08/22/22 0420 08/22/22 0646  WBC 9.3  --   HGB 17.5* 14.6  HCT 57.0* 47.5  PLT 207  --    BMET Recent Labs    08/22/22 0420  NA 138  K 3.4*  CL 95*  CO2 26  GLUCOSE 92  BUN 33*  CREATININE 1.65*  CALCIUM 8.4*   LFT No results for input(s): "PROT", "ALBUMIN", "AST", "ALT", "ALKPHOS", "BILITOT", "BILIDIR", "IBILI" in the last 72 hours. PT/INR No results for input(s): "LABPROT", "INR" in the last 72 hours. Hepatitis  Panel No results for input(s): "HEPBSAG", "HCVAB", "HEPAIGM", "HEPBIGM" in the last 72 hours.    Studies/Results: CT ANGIO GI BLEED  Result Date: 08/22/2022 CLINICAL DATA:  Lower GI bleed.  Rectal bleeding. EXAM: CTA ABDOMEN AND PELVIS WITHOUT AND WITH CONTRAST TECHNIQUE: Multidetector CT imaging of the abdomen and pelvis was performed using the standard protocol during bolus administration of intravenous contrast. Multiplanar reconstructed images and MIPs were obtained and reviewed to evaluate the vascular anatomy. RADIATION DOSE REDUCTION: This exam was performed according to the departmental dose-optimization program which includes automated exposure control, adjustment of the mA and/or kV according to patient size and/or use of iterative reconstruction technique. CONTRAST:  27mL OMNIPAQUE IOHEXOL 350 MG/ML SOLN COMPARISON:  Abdomen and pelvis CT 06/07/2021 FINDINGS: VASCULAR Aorta: Normal caliber aorta without aneurysm, dissection, vasculitis or  significant stenosis. Tiny 4-5 mm ulcerated plaque or penetrating ulcer noted anterior wall abdominal aorta just inferior to the IMA origin (axial 116/5). Prominent atherosclerotic calcification and mural thrombus noted. Celiac: Patent without evidence of aneurysm, dissection, vasculitis or significant stenosis. SMA: Prominent calcific plaque at the ostium with probable flow limiting stenosis (see axial 75/5 and coronal 59/12). Renals: Prominent calcific plaque at the proximal renal arteries bilaterally without definite features of flow limiting stenosis. IMA: Patent without evidence of aneurysm, dissection, vasculitis or significant stenosis. Inflow: Patent without evidence of aneurysm, dissection, vasculitis or significant stenosis. Proximal Outflow: Choose 1 Veins: No obvious venous abnormality within the limitations of this arterial phase study. Review of the MIP images confirms the above findings. NON-VASCULAR Lower chest: Left lower lobe collapse noted with small to moderate bilateral pleural effusions, left greater than right. Hepatobiliary: Subtle nodularity of liver contour raises the question of cirrhosis. No evidence for focal arterial phase hyperenhancement within the parenchyma. There is no evidence for gallstones, gallbladder wall thickening, or pericholecystic fluid. No intrahepatic or extrahepatic biliary dilation. Pancreas: No focal mass lesion. No dilatation of the main duct. No intraparenchymal cyst. No peripancreatic edema. Spleen: No splenomegaly. No focal mass lesion. Adrenals/Urinary Tract: Bilateral adrenal thickening without a discrete nodule or mass. Left kidney unremarkable. Tiny hypodensity in the interpolar right kidney is likely a simple cyst. No followup imaging is recommended. No evidence for hydroureter. The urinary bladder appears normal for the degree of distention. Stomach/Bowel: Unremarkable. No evidence for arterial phase contrast extravasation into the gastric lumen. No  evidence for active hemorrhage into the duodenum. No findings to suggest arterial phase extravasation of contrast in the small bowel or colon. Advanced diverticular disease is noted in the sigmoid colon with inspissated high density material in numerous sigmoid colon diverticuli. A thin collar of fluid density measuring only a bowel 5 mm in diameter with peripheral rim enhancement is seen posterior to the low rectum/anus tracking anteriorly on both sides. This is associated with a rim enhancing fluid collection that tracks caudally in the left ischial anal fat to the left skin surface of the intergluteal fold (see images 89-96 of series 7 and coronal images 82-104 of series 15). Lymphatic: There is no gastrohepatic or hepatoduodenal ligament lymphadenopathy. No retroperitoneal or mesenteric lymphadenopathy. No pelvic sidewall lymphadenopathy. Reproductive: The prostate gland and seminal vesicles are unremarkable. Other: No intraperitoneal free fluid. Musculoskeletal: Diffuse body wall edema evident. No worrisome lytic or sclerotic osseous abnormality. IMPRESSION: 1. No evidence for active arterial phase contrast extravasation into the gastric lumen, duodenum, small bowel, or colon. 2. There is a thin U shaped collar of fluid density with peripheral rim enhancement (perirectal abscess) posterior to  and abutting the low rectum/anus over about 180 degree arc with an associated rim enhancing fistula from the left side of the low rectum/anus down to the left skin surface of the intergluteal fold. 3. Prominent calcific plaque at the ostium of the SMA with probable flow limiting stenosis. 4. Tiny 4-5 mm ulcerated plaque or penetrating ulcer anterior wall abdominal aorta just inferior to the IMA origin. 5. Advanced sigmoid diverticular disease with inspissated high density material in numerous sigmoid colon diverticuli. 6. Left lower lobe collapse with small to moderate bilateral pleural effusions, left greater than right.  7. Subtle nodularity of liver contour raises the question of cirrhosis. 8. Diffuse body wall edema. 9.  Aortic Atherosclerosis (ICD10-I70.0). Electronically Signed   By: Misty Stanley M.D.   On: 08/22/2022 06:59    IMPRESSION/PLAN:  89 year old male with a history of a perirectal abscess s/p I & D 05/2021 with presented to the ED with painless hematochezia.  Hg 17.5 (base line Hg 14.7 on 08/02/2022) -> repeat Hg 14.6. Abdominal/pelvic CTA negative for active GI bleed but showed a U-shaped collar fluid density concerning for a perirectal abscess posterior to and abutting the low rectum/anus with an associated rim-enhancing fistula from the left side of the low rectum/anus down to the left skin surface of the intergluteal fold with advanced sigmoid diverticular disease with high density material in numerous sigmoid colon diverticuli. Rectal exam showed oozing red blood from a left external anorectal fistula (see photos above).  Hemodynamically stable. -Colorectal surgery consult -Check H&H every 6 hours x 24 hours -Transfuse for hemoglobin less than 8 -Possible flexible sigmoidoscopy during his hospital admission, await colorectal surgery recommendations  -NPO -IV fluids per the hospitalist -Further recommendations per Dr. Lyndel Safe   History of tubular adenomatous colon polyps per colonoscopy 04/2012  History of GERD -PPI IV daily  Left lower lobe collapse with small to moderate bilateral pleural effusions, left greater than right per CTA. No focal evidence of respiratory distress.  Subtle nodularity of liver contour raises the question of cirrhosis. -CMP  History of CAD. CHF with LVEF 30 -35%  History of paroxysmal atrial fibrillation not on anticoagulation  PAD. CTA showed prominent calcific plaque at the ostium of the SMA with probable flow limiting stenosis, tiny 4-5 mm ulcerated plaque or penetrating ulcer anterior wall abdominal aorta just inferior to the IMA origin and aortic  atherosclerosis.   DM type II     Noralyn Pick  08/22/2022, 10:17AM    Attending physician's note   I have taken history, reviewed the chart and examined the patient. I performed a substantive portion of this encounter, including complete performance of at least one of the key components, in conjunction with the APP. I agree with the Advanced Practitioner's note, impression and recommendations.   Active bleeding from L gluteal fistula- as evident from pictures above (by Jaclyn Shaggy). Neg CTA for bleed H/O perirectal abscess s/p I&D Jan 2023. Now with recurrent abscess- r/o mass. CT showing subtle liver nodularity. ?cirrhosis. Nl plts  Plan: -FS in AM (r/o masses, doubt crohns) -Check LFTs, INR in AM -Appreciate surgical consultation. -Trend CBC.   Carmell Austria, MD [facility] [phone]

## 2022-08-23 ENCOUNTER — Observation Stay (HOSPITAL_COMMUNITY): Payer: Medicare Other | Admitting: Certified Registered Nurse Anesthetist

## 2022-08-23 ENCOUNTER — Encounter (HOSPITAL_COMMUNITY)
Admission: EM | Disposition: A | Discharge status: Skilled Nursing Facility | Payer: Self-pay | Source: Home / Self Care | Attending: Internal Medicine

## 2022-08-23 DIAGNOSIS — I509 Heart failure, unspecified: Secondary | ICD-10-CM

## 2022-08-23 DIAGNOSIS — N1832 Chronic kidney disease, stage 3b: Secondary | ICD-10-CM | POA: Diagnosis present

## 2022-08-23 DIAGNOSIS — E785 Hyperlipidemia, unspecified: Secondary | ICD-10-CM | POA: Diagnosis present

## 2022-08-23 DIAGNOSIS — I110 Hypertensive heart disease with heart failure: Secondary | ICD-10-CM | POA: Diagnosis not present

## 2022-08-23 DIAGNOSIS — I130 Hypertensive heart and chronic kidney disease with heart failure and stage 1 through stage 4 chronic kidney disease, or unspecified chronic kidney disease: Secondary | ICD-10-CM | POA: Diagnosis present

## 2022-08-23 DIAGNOSIS — K603 Anal fistula: Secondary | ICD-10-CM | POA: Diagnosis not present

## 2022-08-23 DIAGNOSIS — I480 Paroxysmal atrial fibrillation: Secondary | ICD-10-CM | POA: Diagnosis present

## 2022-08-23 DIAGNOSIS — I5023 Acute on chronic systolic (congestive) heart failure: Secondary | ICD-10-CM | POA: Diagnosis not present

## 2022-08-23 DIAGNOSIS — E1122 Type 2 diabetes mellitus with diabetic chronic kidney disease: Secondary | ICD-10-CM | POA: Diagnosis present

## 2022-08-23 DIAGNOSIS — N400 Enlarged prostate without lower urinary tract symptoms: Secondary | ICD-10-CM | POA: Diagnosis present

## 2022-08-23 DIAGNOSIS — I700 Atherosclerosis of aorta: Secondary | ICD-10-CM | POA: Diagnosis present

## 2022-08-23 DIAGNOSIS — E43 Unspecified severe protein-calorie malnutrition: Secondary | ICD-10-CM | POA: Diagnosis present

## 2022-08-23 DIAGNOSIS — K921 Melena: Secondary | ICD-10-CM | POA: Diagnosis present

## 2022-08-23 DIAGNOSIS — Z79899 Other long term (current) drug therapy: Secondary | ICD-10-CM | POA: Diagnosis not present

## 2022-08-23 DIAGNOSIS — M2518 Fistula, other specified site: Secondary | ICD-10-CM | POA: Diagnosis not present

## 2022-08-23 DIAGNOSIS — K602 Anal fissure, unspecified: Secondary | ICD-10-CM | POA: Diagnosis not present

## 2022-08-23 DIAGNOSIS — K625 Hemorrhage of anus and rectum: Secondary | ICD-10-CM | POA: Diagnosis present

## 2022-08-23 DIAGNOSIS — I442 Atrioventricular block, complete: Secondary | ICD-10-CM | POA: Diagnosis present

## 2022-08-23 DIAGNOSIS — I5022 Chronic systolic (congestive) heart failure: Secondary | ICD-10-CM | POA: Diagnosis not present

## 2022-08-23 DIAGNOSIS — Z7984 Long term (current) use of oral hypoglycemic drugs: Secondary | ICD-10-CM | POA: Diagnosis not present

## 2022-08-23 DIAGNOSIS — J449 Chronic obstructive pulmonary disease, unspecified: Secondary | ICD-10-CM

## 2022-08-23 DIAGNOSIS — I2510 Atherosclerotic heart disease of native coronary artery without angina pectoris: Secondary | ICD-10-CM | POA: Diagnosis present

## 2022-08-23 DIAGNOSIS — J9819 Other pulmonary collapse: Secondary | ICD-10-CM | POA: Diagnosis not present

## 2022-08-23 DIAGNOSIS — K612 Anorectal abscess: Secondary | ICD-10-CM | POA: Diagnosis present

## 2022-08-23 DIAGNOSIS — E876 Hypokalemia: Secondary | ICD-10-CM | POA: Diagnosis present

## 2022-08-23 DIAGNOSIS — Z66 Do not resuscitate: Secondary | ICD-10-CM | POA: Diagnosis not present

## 2022-08-23 DIAGNOSIS — E1151 Type 2 diabetes mellitus with diabetic peripheral angiopathy without gangrene: Secondary | ICD-10-CM | POA: Diagnosis present

## 2022-08-23 DIAGNOSIS — E119 Type 2 diabetes mellitus without complications: Secondary | ICD-10-CM | POA: Diagnosis not present

## 2022-08-23 DIAGNOSIS — E039 Hypothyroidism, unspecified: Secondary | ICD-10-CM | POA: Diagnosis present

## 2022-08-23 DIAGNOSIS — Z79890 Hormone replacement therapy (postmenopausal): Secondary | ICD-10-CM | POA: Diagnosis not present

## 2022-08-23 DIAGNOSIS — R0989 Other specified symptoms and signs involving the circulatory and respiratory systems: Secondary | ICD-10-CM | POA: Diagnosis not present

## 2022-08-23 DIAGNOSIS — K611 Rectal abscess: Secondary | ICD-10-CM | POA: Diagnosis not present

## 2022-08-23 DIAGNOSIS — I10 Essential (primary) hypertension: Secondary | ICD-10-CM | POA: Diagnosis not present

## 2022-08-23 DIAGNOSIS — N179 Acute kidney failure, unspecified: Secondary | ICD-10-CM | POA: Diagnosis not present

## 2022-08-23 DIAGNOSIS — Z87891 Personal history of nicotine dependence: Secondary | ICD-10-CM

## 2022-08-23 DIAGNOSIS — Z8249 Family history of ischemic heart disease and other diseases of the circulatory system: Secondary | ICD-10-CM | POA: Diagnosis not present

## 2022-08-23 DIAGNOSIS — J9601 Acute respiratory failure with hypoxia: Secondary | ICD-10-CM | POA: Diagnosis not present

## 2022-08-23 HISTORY — PX: FLEXIBLE SIGMOIDOSCOPY: SHX5431

## 2022-08-23 LAB — CBC
HCT: 49.7 % (ref 39.0–52.0)
HCT: 51.3 % (ref 39.0–52.0)
Hemoglobin: 14.7 g/dL (ref 13.0–17.0)
Hemoglobin: 15.6 g/dL (ref 13.0–17.0)
MCH: 24.3 pg — ABNORMAL LOW (ref 26.0–34.0)
MCH: 24.7 pg — ABNORMAL LOW (ref 26.0–34.0)
MCHC: 29.6 g/dL — ABNORMAL LOW (ref 30.0–36.0)
MCHC: 30.4 g/dL (ref 30.0–36.0)
MCV: 82.3 fL (ref 80.0–100.0)
MCV: 81.2 fL (ref 80.0–100.0)
Platelets: 170 10*3/uL (ref 150–400)
Platelets: 188 10*3/uL (ref 150–400)
RBC: 6.04 MIL/uL — ABNORMAL HIGH (ref 4.22–5.81)
RBC: 6.32 MIL/uL — ABNORMAL HIGH (ref 4.22–5.81)
RDW: 23.2 % — ABNORMAL HIGH (ref 11.5–15.5)
RDW: 23.5 % — ABNORMAL HIGH (ref 11.5–15.5)
WBC: 6.8 10*3/uL (ref 4.0–10.5)
WBC: 6.6 10*3/uL (ref 4.0–10.5)
nRBC: 0 % (ref 0.0–0.2)
nRBC: 0 % (ref 0.0–0.2)

## 2022-08-23 LAB — GLUCOSE, CAPILLARY
Glucose-Capillary: 106 mg/dL — ABNORMAL HIGH (ref 70–99)
Glucose-Capillary: 113 mg/dL — ABNORMAL HIGH (ref 70–99)
Glucose-Capillary: 134 mg/dL — ABNORMAL HIGH (ref 70–99)
Glucose-Capillary: 78 mg/dL (ref 70–99)

## 2022-08-23 LAB — COMPREHENSIVE METABOLIC PANEL
ALT: 16 U/L (ref 0–44)
AST: 23 U/L (ref 15–41)
Albumin: 2.6 g/dL — ABNORMAL LOW (ref 3.5–5.0)
Alkaline Phosphatase: 152 U/L — ABNORMAL HIGH (ref 38–126)
Anion gap: 12 (ref 5–15)
BUN: 29 mg/dL — ABNORMAL HIGH (ref 8–23)
CO2: 23 mmol/L (ref 22–32)
Calcium: 7.8 mg/dL — ABNORMAL LOW (ref 8.9–10.3)
Chloride: 103 mmol/L (ref 98–111)
Creatinine, Ser: 1.43 mg/dL — ABNORMAL HIGH (ref 0.61–1.24)
GFR, Estimated: 47 mL/min — ABNORMAL LOW (ref >60)
Glucose, Bld: 100 mg/dL — ABNORMAL HIGH (ref 70–99)
Potassium: 3.5 mmol/L (ref 3.5–5.1)
Sodium: 138 mmol/L (ref 135–145)
Total Bilirubin: 2.3 mg/dL — ABNORMAL HIGH (ref 0.3–1.2)
Total Protein: 5.6 g/dL — ABNORMAL LOW (ref 6.5–8.1)

## 2022-08-23 LAB — BILIRUBIN, DIRECT: Bilirubin, Direct: 0.8 mg/dL — ABNORMAL HIGH (ref 0.0–0.2)

## 2022-08-23 LAB — PROTIME-INR
INR: 1.2 (ref 0.8–1.2)
Prothrombin Time: 15.5 seconds — ABNORMAL HIGH (ref 11.4–15.2)

## 2022-08-23 SURGERY — SIGMOIDOSCOPY, FLEXIBLE
Anesthesia: Monitor Anesthesia Care

## 2022-08-23 MED ORDER — PHENYLEPHRINE 80 MCG/ML (10ML) SYRINGE FOR IV PUSH (FOR BLOOD PRESSURE SUPPORT)
PREFILLED_SYRINGE | INTRAVENOUS | Status: DC | PRN
  Administered 2022-08-23 (×2): 120 ug via INTRAVENOUS

## 2022-08-23 MED ORDER — SODIUM CHLORIDE 0.9 % IV SOLN: INTRAVENOUS | Status: DC

## 2022-08-23 MED ORDER — ORAL CARE MOUTH RINSE: 15.0000 mL | OROMUCOSAL | Status: DC | PRN

## 2022-08-23 MED ORDER — ENSURE ENLIVE PO LIQD
237.0000 mL | Freq: Two times a day (BID) | ORAL | Status: DC
  Administered 2022-08-24 – 2022-08-27 (×5): 237 mL via ORAL

## 2022-08-23 MED ORDER — LACTATED RINGERS IV SOLN: INTRAVENOUS | Status: DC

## 2022-08-23 MED ORDER — PROPOFOL 10 MG/ML IV BOLUS
INTRAVENOUS | Status: DC | PRN
  Administered 2022-08-23: 20 mg via INTRAVENOUS
  Administered 2022-08-23: 30 mg via INTRAVENOUS
  Administered 2022-08-23: 20 mg via INTRAVENOUS

## 2022-08-23 NOTE — Transfer of Care (Signed)
Immediate Anesthesia Transfer of Care Note  Patient: Daniel Woodard  Procedure(s) Performed: FLEXIBLE SIGMOIDOSCOPY  Patient Location: Endoscopy Unit  Anesthesia Type:MAC  Level of Consciousness: drowsy  Airway & Oxygen Therapy: Patient Spontanous Breathing and Patient connected to face mask  Post-op Assessment: Report given to RN and Post -op Vital signs reviewed and stable  Post vital signs: Reviewed and stable  Last Vitals:  Vitals Value Taken Time  BP    Temp    Pulse    Resp    SpO2      Last Pain:  Vitals:   08/23/22 1152  TempSrc: Temporal  PainSc: 0-No pain         Complications: No notable events documented.

## 2022-08-23 NOTE — Progress Notes (Signed)
Initial Nutrition Assessment  DOCUMENTATION CODES:   Severe malnutrition in context of chronic illness  INTERVENTION:  - Advance diet to Regular as medically appropriate.  - Ensure Plus High Protein po BID once diet advanced, each supplement provides 350 kcal and 20 grams of protein. - Magic cup BID once diet advanced, each supplement provides 290 kcal and 9 grams of protein - Encourage intake at all meals. - Multivitamin with minerals daily to support micronutrient needs.  - Monitor weight trends.    NUTRITION DIAGNOSIS:   Severe Malnutrition related to chronic illness as evidenced by severe fat depletion, severe muscle depletion.  GOAL:   Patient will meet greater than or equal to 90% of their needs  MONITOR:   Weight trends, Diet advancement, Supplement acceptance, PO intake  REASON FOR ASSESSMENT:   Malnutrition Screening Tool    ASSESSMENT:   89 y.o. male with medical history significant of complete heart block, COPD, depression, type 2 diabetes, colon diverticulosis, HLD, HTN who presented with rectal bleeding. 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    Met with patient and daughter at bedside this AM. Paitent in and out of sleep so daughter provided most of information.    Reported UBW to be around 175# within the past year. Patient used to weigh as much as 200# but he reports this was likely more than 3 years ago.  Daughter reports patient has lost weight over the past few months due to eating less.   Per EMR, patient's weight has fluctuated between 163-172# within the past year. Weighed as low as 152# this admission but patient also weighed at 167# the same day. Current weight status and recent changes difficult to fully assess at this time.   Patient recently lived at home alone and his daughter and her sister would come cook meals for patient which he would have as leftovers for several days.  However, when daughters began talking about a living facility patient  started eating less and less. Appetite has also decreased recently.  Patient currently NPO for GI procedure today. Daughter reports patient has not consumed nutrition supplements in the past but feels he would be receptive to them once diet advanced. Discussed Ensure and Magic Cup. Daughter feels he would like chocolate Ensure and vanilla Magic Cup, will order once diet advanced.   Medications reviewed and include: -  Labs reviewed:  Creatinine 1.43 HA1C 6.9   NUTRITION - FOCUSED PHYSICAL EXAM:  Flowsheet Row Most Recent Value  Orbital Region Moderate depletion  Upper Arm Region Severe depletion  Thoracic and Lumbar Region Severe depletion  Buccal Region Severe depletion  Temple Region Severe depletion  Clavicle Bone Region Severe depletion  Clavicle and Acromion Bone Region Severe depletion  Scapular Bone Region Unable to assess  Dorsal Hand Moderate depletion  Patellar Region Severe depletion  Anterior Thigh Region Severe depletion  Posterior Calf Region Moderate depletion  Edema (RD Assessment) None  Hair Reviewed  Eyes Reviewed  Mouth Reviewed  Skin Reviewed  Nails Reviewed       Diet Order:   Diet Order             Diet NPO time specified  Diet effective now                   EDUCATION NEEDS:  Education needs have been addressed  Skin:  Skin Assessment: Reviewed RN Assessment  Last BM:  3/25  Height:  Ht Readings from Last 1 Encounters:  08/23/22 6\' 1"  (1.854  m)   Weight:  Wt Readings from Last 1 Encounters:  08/23/22 69 kg   BMI:  Body mass index is 20.07 kg/m.  Estimated Nutritional Needs:  Kcal:  1950-2100 kcals Protein:  80-100 grams Fluid:  >/= 1.9L    Samson Frederic RD, LDN For contact information, refer to [facility].

## 2022-08-23 NOTE — Anesthesia Preprocedure Evaluation (Addendum)
Anesthesia Evaluation  Patient identified by MRN, date of birth, ID band Patient awake    Reviewed: Allergy & Precautions, NPO status , Patient's Chart, lab work & pertinent test results  Airway Mallampati: II       Dental  (+) Edentulous Upper, Edentulous Lower   Pulmonary COPD, former smoker   Pulmonary exam normal        Cardiovascular hypertension, Pt. on medications and Pt. on home beta blockers + CAD, + Peripheral Vascular Disease and +CHF  + dysrhythmias Atrial Fibrillation + pacemaker + Valvular Problems/Murmurs AS  Rhythm:Regular Rate:Normal  ECHO 02/24:  1. Left ventricular ejection fraction, by estimation, is 30 to 35%. The  left ventricle has moderately decreased function. The left ventricle  demonstrates global hypokinesis with paradoxical septal motion in the  setting of RV pacing. There is mild  concentric left ventricular hypertrophy. Diastolic function indeterminant  due to severe MAC. Elevated left atrial pressure.   2. The aortic valve is calcified. There is severe calcifcation of the  aortic valve. There is severe thickening of the aortic valve. Aortic valve  regurgitation is mild. There is moderate-to-severe low flow, low gradient  aortic stenosis. Aortic valve  area, by VTI measures 1.08 cm. Aortic valve mean gradient measures 9.0  mmHg. Aortic valve Vmax measures 2.12 m/s. DI 0.34, SVi 22cc/m2.   3. Right ventricular systolic function is mildly reduced. The right  ventricular size is mildly enlarged.   4. The mitral valve is degenerative. Moderate mitral valve regurgitation.  Mild mitral stenosis. Severe mitral annular calcification.   5. Tricuspid valve regurgitation is mild to moderate.   6. Left atrial size was mildly dilated.   7. Right atrial size was mildly dilated.     Neuro/Psych    Depression    negative neurological ROS     GI/Hepatic Neg liver ROS,,,Rectal bleeding   Endo/Other   diabetes, Type 2    Renal/GU   negative genitourinary   Musculoskeletal negative musculoskeletal ROS (+)    Abdominal Normal abdominal exam  (+)   Peds  Hematology negative hematology ROS (+)   Anesthesia Other Findings   Reproductive/Obstetrics                             Anesthesia Physical Anesthesia Plan  ASA: 4  Anesthesia Plan: MAC   Post-op Pain Management:    Induction: Intravenous  PONV Risk Score and Plan: 1 and Propofol infusion and Treatment may vary due to age or medical condition  Airway Management Planned: Simple Face Mask and Nasal Cannula  Additional Equipment: None  Intra-op Plan:   Post-operative Plan:   Informed Consent: I have reviewed the patients History and Physical, chart, labs and discussed the procedure including the risks, benefits and alternatives for the proposed anesthesia with the patient or authorized representative who has indicated his/her understanding and acceptance.     Dental advisory given  Plan Discussed with:   Anesthesia Plan Comments:        Anesthesia Quick Evaluation

## 2022-08-23 NOTE — Progress Notes (Signed)
PROGRESS NOTE    Daniel Woodard  [id number] DOB: [birth year] DOA: 08/22/2022 PCP: Biagio Borg, MD   Brief Narrative: 89 yo male with pmh of allergic rhinitis, atrial fibrillation not on anticoagulation MAC bacteremia, BPH, bradycardia, carotid artery occlusion, colon polyps, complete heart block, permanent pacemaker placement, COPD, depression, type 2 diabetes, colon diverticulosis, hyperlipidemia, hypertension, PVD, history of pneumonia, history of herpes zoster, perirectal abscess in January 2023 requiring an incision and drainage now presents to the ER with bright red bleeding with blood clots.  Denied abdominal pain nausea vomiting constipation or melena.  Denied fever or chills.  He lost about 8 pounds. Admission hemoglobin was 17.5 repeat was 14.6.  CTA -negative for active GI bleed but showed a U-shaped collar fluid density concerning for a perirectal abscess posterior to and abutting the low rectum/anus with an associated rim-enhancing fistula from the left side of the low rectum/anus down to the left skin surface of the intergluteal fold. Other CTA findings included prominent calcific plaque at the ostium of the SMA with probable stenosis, tiny 4-5 mm ulcerated plaque or penetrating ulcer anterior wall abdominal aorta just inferior to the IMA origin, left lower lobe collapse with small to moderate bilateral pleural effusions (left greater than right), subtle nodularity of liver contour suggestive of possible cirrhosis, diffuse body wall edema and aortic atherosclerosis.   Assessment & Plan:   Principal Problem:   Rectal bleeding Active Problems:   Hyperlipidemia   Essential hypertension   COPD GOLD II / AB component    CKD (chronic kidney disease), stage III (HCC)   Aortic atherosclerosis (HCC)   Perirectal abscess   Coronary artery disease involving native coronary artery of native heart without angina pectoris   Chronic systolic CHF (congestive heart failure) ([place])   #1  Perianal fistula with active bleeding -patient had flexible sigmoidoscopy today.  Preparation of the colon was inadequate.  Perianal fistula with active bleeding and stool in the rectum was noted.  GI recommends surgical input.  Surgery following.  Appreciate general surgery and GI Hemoglobin on admission was 17 down to 14 his baseline Trend h and h Patient with history of perirectal abscess in January 2023 status post I&D by colorectal surgery.  2 ?  Perirectal abscess-CT scan shows a U-shaped collar of fluid density with peripheral rim  enhancement posterior to and abutting the lower rectum and anus Continue IV antibiotics Zosyn   #3 hyperlipidemia on statin  #4 essential hypertension blood pressure has been soft holding Lasix and metoprolol.  #5 history of COPD stable not on oxygen at home as needed nebulizers  #6 CAD on statin beta-blockers   #7 history of systolic heart failure chronic stable Not fluid overloaded He is on Lasix 40 mg daily with metoprolol at home.  These have been on hold due to soft blood pressure.  #8 stage IIIb CKD-creatinine 1.43 from 1.65 on admit continue slow IV fluids monitor fluid status closely with history of systolic heart failure.   Estimated body mass index is 20.07 kg/m as calculated from the following:   Height as of this encounter: 6\' 1"  (1.854 m).   Weight as of this encounter: 69 kg.  DVT prophylaxis: None due to GI bleed Code Status: Full code Family Communication: Daughter at bedside  disposition Plan:  Status is: Inpatient  the patient will require care spanning > 2 midnights and should be moved to inpatient because: GI bleed, perirectal abscess   Consultants:  GI General surgery  Procedures: None  Antimicrobials: Zosyn  Subjective: Patient resting in bed no further bleeding overnight  Objective: Vitals:   08/23/22 0400 08/23/22 0500 08/23/22 0600 08/23/22 0800  BP: 109/66 117/74 117/71   Pulse:      Resp: 16 15 18    Temp:  (!) 97 F (36.1 C)   (!) 97.3 F (36.3 C)  TempSrc: Axillary   Axillary  SpO2: 96%  97%   Weight:      Height:        Intake/Output Summary (Last 24 hours) at 08/23/2022 1147 Last data filed at 08/23/2022 0600 Gross per 24 hour  Intake 99.38 ml  Output 250 ml  Net -150.62 ml   Filed Weights   08/22/22 0420 08/22/22 1002  Weight: 75.8 kg 69 kg    Examination:  General exam: Appears in no acute distress  Respiratory system: Clear to auscultation. Respiratory effort normal. Cardiovascular system: S1 & S2 heard, RRR. No JVD, murmurs, rubs, gallops or clicks. No pedal edema. Gastrointestinal system: Abdomen is nondistended, soft and nontender. No organomegaly or masses felt. Normal bowel sounds heard. Central nervous system: Alert and oriented. No focal neurological deficits. Extremities: No edema Skin: peri rectal fistula Psychiatry: Judgement and insight appear normal. Mood & affect appropriate.     Data Reviewed: I have personally reviewed following labs and imaging studies  CBC: Recent Labs  Lab 08/22/22 0420 08/22/22 0646 08/22/22 1050 08/22/22 1425 08/22/22 2136 08/23/22 0317  WBC 9.3  --   --   --   --  6.8  NEUTROABS 7.7  --   --   --   --   --   HGB 17.5* 14.6 13.9 11.9* 15.8 14.7  HCT 57.0* 47.5 44.7 39.6 52.3* 49.7  MCV 79.1*  --   --   --   --  82.3  PLT 207  --   --   --   --  [id number]   Basic Metabolic Panel: Recent Labs  Lab 08/22/22 0420 08/23/22 0317  NA 138 138  K 3.4* 3.5  CL 95* 103  CO2 26 23  GLUCOSE 92 100*  BUN 33* 29*  CREATININE 1.65* 1.43*  CALCIUM 8.4* 7.8*   GFR: Estimated Creatinine Clearance: 33.5 mL/min (A) (by C-G formula based on SCr of 1.43 mg/dL (H)). Liver Function Tests: Recent Labs  Lab 08/23/22 0317  AST 23  ALT 16  ALKPHOS 152*  BILITOT 2.3*  PROT 5.6*  ALBUMIN 2.6*   No results for input(s): "LIPASE", "AMYLASE" in the last 168 hours. No results for input(s): "AMMONIA" in the last 168 hours. Coagulation  Profile: Recent Labs  Lab 08/23/22 0317  INR 1.2   Cardiac Enzymes: No results for input(s): "CKTOTAL", "CKMB", "CKMBINDEX", "TROPONINI" in the last 168 hours. BNP (last 3 results) No results for input(s): "PROBNP" in the last 8760 hours. HbA1C: No results for input(s): "HGBA1C" in the last 72 hours. CBG: Recent Labs  Lab 08/22/22 2124 08/22/22 2145 08/22/22 2225 08/23/22 0022 08/23/22 0542  GLUCAP 58* 64* 163* 134* 113*   Lipid Profile: No results for input(s): "CHOL", "HDL", "LDLCALC", "TRIG", "CHOLHDL", "LDLDIRECT" in the last 72 hours. Thyroid Function Tests: No results for input(s): "TSH", "T4TOTAL", "FREET4", "T3FREE", "THYROIDAB" in the last 72 hours. Anemia Panel: No results for input(s): "VITAMINB12", "FOLATE", "FERRITIN", "TIBC", "IRON", "RETICCTPCT" in the last 72 hours. Sepsis Labs: No results for input(s): "PROCALCITON", "LATICACIDVEN" in the last 168 hours.  Recent Results (from the past 240 hour(s))  MRSA Next Gen by PCR, Nasal  Status: None   Collection Time: 08/22/22  8:59 PM   Specimen: Nasal Mucosa; Nasal Swab  Result Value Ref Range Status   MRSA by PCR Next Gen NOT DETECTED NOT DETECTED Final    Comment: (NOTE) The GeneXpert MRSA Assay (FDA approved for NASAL specimens only), is one component of a comprehensive MRSA colonization surveillance program. It is not intended to diagnose MRSA infection nor to guide or monitor treatment for MRSA infections. Test performance is not FDA approved in patients less than 17 years old. Performed at [facility], [place] [address] [place], [place] [address]          Radiology Studies: DG CHEST PORT 1 VIEW  Result Date: 08/22/2022 CLINICAL DATA:  Pleural effusion, atelectasis. EXAM: PORTABLE CHEST 1 VIEW COMPARISON:  06/07/2021. FINDINGS: Left chest biventricular pacemaker with leads projecting over the right atrium, right ventricle and coronary sinus. Small left and trace right pleural  effusions with adjacent atelectasis in the lung bases, left-greater-than-right. Stable cardiac and mediastinal contours. No pneumothorax. IMPRESSION: Small left and trace right pleural effusions with adjacent atelectasis in the lung bases, left-greater-than-right. Electronically Signed   By: Emmit Alexanders M.D.   On: 08/22/2022 12:17   CT ANGIO GI BLEED  Result Date: 08/22/2022 CLINICAL DATA:  Lower GI bleed.  Rectal bleeding. EXAM: CTA ABDOMEN AND PELVIS WITHOUT AND WITH CONTRAST TECHNIQUE: Multidetector CT imaging of the abdomen and pelvis was performed using the standard protocol during bolus administration of intravenous contrast. Multiplanar reconstructed images and MIPs were obtained and reviewed to evaluate the vascular anatomy. RADIATION DOSE REDUCTION: This exam was performed according to the departmental dose-optimization program which includes automated exposure control, adjustment of the mA and/or kV according to patient size and/or use of iterative reconstruction technique. CONTRAST:  5mL OMNIPAQUE IOHEXOL 350 MG/ML SOLN COMPARISON:  Abdomen and pelvis CT 06/07/2021 FINDINGS: VASCULAR Aorta: Normal caliber aorta without aneurysm, dissection, vasculitis or significant stenosis. Tiny 4-5 mm ulcerated plaque or penetrating ulcer noted anterior wall abdominal aorta just inferior to the IMA origin (axial 116/5). Prominent atherosclerotic calcification and mural thrombus noted. Celiac: Patent without evidence of aneurysm, dissection, vasculitis or significant stenosis. SMA: Prominent calcific plaque at the ostium with probable flow limiting stenosis (see axial 75/5 and coronal 59/12). Renals: Prominent calcific plaque at the proximal renal arteries bilaterally without definite features of flow limiting stenosis. IMA: Patent without evidence of aneurysm, dissection, vasculitis or significant stenosis. Inflow: Patent without evidence of aneurysm, dissection, vasculitis or significant stenosis. Proximal  Outflow: Choose 1 Veins: No obvious venous abnormality within the limitations of this arterial phase study. Review of the MIP images confirms the above findings. NON-VASCULAR Lower chest: Left lower lobe collapse noted with small to moderate bilateral pleural effusions, left greater than right. Hepatobiliary: Subtle nodularity of liver contour raises the question of cirrhosis. No evidence for focal arterial phase hyperenhancement within the parenchyma. There is no evidence for gallstones, gallbladder wall thickening, or pericholecystic fluid. No intrahepatic or extrahepatic biliary dilation. Pancreas: No focal mass lesion. No dilatation of the main duct. No intraparenchymal cyst. No peripancreatic edema. Spleen: No splenomegaly. No focal mass lesion. Adrenals/Urinary Tract: Bilateral adrenal thickening without a discrete nodule or mass. Left kidney unremarkable. Tiny hypodensity in the interpolar right kidney is likely a simple cyst. No followup imaging is recommended. No evidence for hydroureter. The urinary bladder appears normal for the degree of distention. Stomach/Bowel: Unremarkable. No evidence for arterial phase contrast extravasation into the gastric lumen. No evidence for active hemorrhage  into the duodenum. No findings to suggest arterial phase extravasation of contrast in the small bowel or colon. Advanced diverticular disease is noted in the sigmoid colon with inspissated high density material in numerous sigmoid colon diverticuli. A thin collar of fluid density measuring only a bowel 5 mm in diameter with peripheral rim enhancement is seen posterior to the low rectum/anus tracking anteriorly on both sides. This is associated with a rim enhancing fluid collection that tracks caudally in the left ischial anal fat to the left skin surface of the intergluteal fold (see images 89-96 of series 7 and coronal images 82-104 of series 15). Lymphatic: There is no gastrohepatic or hepatoduodenal ligament  lymphadenopathy. No retroperitoneal or mesenteric lymphadenopathy. No pelvic sidewall lymphadenopathy. Reproductive: The prostate gland and seminal vesicles are unremarkable. Other: No intraperitoneal free fluid. Musculoskeletal: Diffuse body wall edema evident. No worrisome lytic or sclerotic osseous abnormality. IMPRESSION: 1. No evidence for active arterial phase contrast extravasation into the gastric lumen, duodenum, small bowel, or colon. 2. There is a thin U shaped collar of fluid density with peripheral rim enhancement (perirectal abscess) posterior to and abutting the low rectum/anus over about 180 degree arc with an associated rim enhancing fistula from the left side of the low rectum/anus down to the left skin surface of the intergluteal fold. 3. Prominent calcific plaque at the ostium of the SMA with probable flow limiting stenosis. 4. Tiny 4-5 mm ulcerated plaque or penetrating ulcer anterior wall abdominal aorta just inferior to the IMA origin. 5. Advanced sigmoid diverticular disease with inspissated high density material in numerous sigmoid colon diverticuli. 6. Left lower lobe collapse with small to moderate bilateral pleural effusions, left greater than right. 7. Subtle nodularity of liver contour raises the question of cirrhosis. 8. Diffuse body wall edema. 9.  Aortic Atherosclerosis (ICD10-I70.0). Electronically Signed   By: Misty Stanley M.D.   On: 08/22/2022 06:59        Scheduled Meds:  Chlorhexidine Gluconate Cloth  6 each Topical Daily   pantoprazole (PROTONIX) IV  40 mg Intravenous Q24H   Continuous Infusions:  piperacillin-tazobactam (ZOSYN)  IV Stopped (08/23/22 0912)     LOS: 0 days    Time spent: 52 min  Georgette Shell, MD 08/23/2022, 11:47 AM

## 2022-08-23 NOTE — Anesthesia Postprocedure Evaluation (Signed)
Anesthesia Post Note  Patient: Daniel Woodard  Procedure(s) Performed: [place]     Patient location during evaluation: PACU Anesthesia Type: MAC Level of consciousness: awake and alert Pain management: pain level controlled Vital Signs Assessment: post-procedure vital signs reviewed and stable Respiratory status: spontaneous breathing, nonlabored ventilation, respiratory function stable and patient connected to nasal cannula oxygen Cardiovascular status: stable and blood pressure returned to baseline Postop Assessment: no apparent nausea or vomiting Anesthetic complications: no   No notable events documented.  Last Vitals:  Vitals:   08/23/22 1240 08/23/22 1250  BP: 105/68 105/68  Pulse: 69 73  Resp: 13 15  Temp:    SpO2: 94% 100%    Last Pain:  Vitals:   08/23/22 1250  TempSrc:   PainSc: 0-No pain                 Belenda Cruise P Chenell Lozon

## 2022-08-23 NOTE — Interval H&P Note (Signed)
History and Physical Interval Note:  08/23/2022 12:01 PM  Daniel Woodard  has presented today for surgery, with the diagnosis of Anorectal fistual, rectal bleeding, anemia.  The various methods of treatment have been discussed with the patient and family. After consideration of risks, benefits and other options for treatment, the patient has consented to  Procedure(s): FLEXIBLE SIGMOIDOSCOPY (N/A) as a surgical intervention.  The patient's history has been reviewed, patient examined, no change in status, stable for surgery.  I have reviewed the patient's chart and labs.  Questions were answered to the patient's satisfaction.     Jackquline Denmark

## 2022-08-23 NOTE — Anesthesia Procedure Notes (Signed)
Procedure Name: MAC Date/Time: 08/23/2022 12:13 PM  Performed by: Claudia Desanctis, CRNAPre-anesthesia Checklist: Patient identified, Emergency Drugs available, Suction available and Patient being monitored Patient Re-evaluated:Patient Re-evaluated prior to induction Oxygen Delivery Method: Simple face mask

## 2022-08-23 NOTE — Progress Notes (Signed)
2nd tap water enema slight brownish color again with no visible clots. Enema catheter was gently placed and tap water instilled. PT offered no complaints and catheter entered with ease.

## 2022-08-23 NOTE — Progress Notes (Signed)
Day of Surgery   Subjective/Chief Complaint: No issues overnight, not sure why he is in stepdown, he says minimal drainage   Objective: Vital signs in last 24 hours: Temp:  [97 F (36.1 C)-98.2 F (36.8 C)] 97 F (36.1 C) (03/26 0400) Pulse Rate:  [71-90] 75 (03/26 0100) Resp:  [13-24] 18 (03/26 0600) BP: (102-118)/(57-77) 117/71 (03/26 0600) SpO2:  [91 %-100 %] 97 % (03/26 0600) Weight:  [69 kg] 69 kg (03/25 1002) Last BM Date : 08/22/22  Intake/Output from previous day: 03/25 0701 - 03/26 0700 In: 99.4 [IV Piggyback:99.4] Out: 250 [Urine:250] Intake/Output this shift: No intake/output data recorded.  Ab soft  Lab Results:  Recent Labs    08/22/22 0420 08/22/22 0646 08/22/22 2136 08/23/22 0317  WBC 9.3  --   --  6.8  HGB 17.5*   < > 15.8 14.7  HCT 57.0*   < > 52.3* 49.7  PLT 207  --   --  170   < > = values in this interval not displayed.   BMET Recent Labs    08/22/22 0420 08/23/22 0317  NA 138 138  K 3.4* 3.5  CL 95* 103  CO2 26 23  GLUCOSE 92 100*  BUN 33* 29*  CREATININE 1.65* 1.43*  CALCIUM 8.4* 7.8*   PT/INR Recent Labs    08/23/22 0317  LABPROT 15.5*  INR 1.2   ABG No results for input(s): "PHART", "HCO3" in the last 72 hours.  Invalid input(s): "PCO2", "PO2"  Studies/Results: DG CHEST PORT 1 VIEW  Result Date: 08/22/2022 CLINICAL DATA:  Pleural effusion, atelectasis. EXAM: PORTABLE CHEST 1 VIEW COMPARISON:  06/07/2021. FINDINGS: Left chest biventricular pacemaker with leads projecting over the right atrium, right ventricle and coronary sinus. Small left and trace right pleural effusions with adjacent atelectasis in the lung bases, left-greater-than-right. Stable cardiac and mediastinal contours. No pneumothorax. IMPRESSION: Small left and trace right pleural effusions with adjacent atelectasis in the lung bases, left-greater-than-right. Electronically Signed   By: Emmit Alexanders M.D.   On: 08/22/2022 12:17   CT ANGIO GI BLEED  Result  Date: 08/22/2022 CLINICAL DATA:  Lower GI bleed.  Rectal bleeding. EXAM: CTA ABDOMEN AND PELVIS WITHOUT AND WITH CONTRAST TECHNIQUE: Multidetector CT imaging of the abdomen and pelvis was performed using the standard protocol during bolus administration of intravenous contrast. Multiplanar reconstructed images and MIPs were obtained and reviewed to evaluate the vascular anatomy. RADIATION DOSE REDUCTION: This exam was performed according to the departmental dose-optimization program which includes automated exposure control, adjustment of the mA and/or kV according to patient size and/or use of iterative reconstruction technique. CONTRAST:  22mL OMNIPAQUE IOHEXOL 350 MG/ML SOLN COMPARISON:  Abdomen and pelvis CT 06/07/2021 FINDINGS: VASCULAR Aorta: Normal caliber aorta without aneurysm, dissection, vasculitis or significant stenosis. Tiny 4-5 mm ulcerated plaque or penetrating ulcer noted anterior wall abdominal aorta just inferior to the IMA origin (axial 116/5). Prominent atherosclerotic calcification and mural thrombus noted. Celiac: Patent without evidence of aneurysm, dissection, vasculitis or significant stenosis. SMA: Prominent calcific plaque at the ostium with probable flow limiting stenosis (see axial 75/5 and coronal 59/12). Renals: Prominent calcific plaque at the proximal renal arteries bilaterally without definite features of flow limiting stenosis. IMA: Patent without evidence of aneurysm, dissection, vasculitis or significant stenosis. Inflow: Patent without evidence of aneurysm, dissection, vasculitis or significant stenosis. Proximal Outflow: Choose 1 Veins: No obvious venous abnormality within the limitations of this arterial phase study. Review of the MIP images confirms the above findings. NON-VASCULAR  Lower chest: Left lower lobe collapse noted with small to moderate bilateral pleural effusions, left greater than right. Hepatobiliary: Subtle nodularity of liver contour raises the question of  cirrhosis. No evidence for focal arterial phase hyperenhancement within the parenchyma. There is no evidence for gallstones, gallbladder wall thickening, or pericholecystic fluid. No intrahepatic or extrahepatic biliary dilation. Pancreas: No focal mass lesion. No dilatation of the main duct. No intraparenchymal cyst. No peripancreatic edema. Spleen: No splenomegaly. No focal mass lesion. Adrenals/Urinary Tract: Bilateral adrenal thickening without a discrete nodule or mass. Left kidney unremarkable. Tiny hypodensity in the interpolar right kidney is likely a simple cyst. No followup imaging is recommended. No evidence for hydroureter. The urinary bladder appears normal for the degree of distention. Stomach/Bowel: Unremarkable. No evidence for arterial phase contrast extravasation into the gastric lumen. No evidence for active hemorrhage into the duodenum. No findings to suggest arterial phase extravasation of contrast in the small bowel or colon. Advanced diverticular disease is noted in the sigmoid colon with inspissated high density material in numerous sigmoid colon diverticuli. A thin collar of fluid density measuring only a bowel 5 mm in diameter with peripheral rim enhancement is seen posterior to the low rectum/anus tracking anteriorly on both sides. This is associated with a rim enhancing fluid collection that tracks caudally in the left ischial anal fat to the left skin surface of the intergluteal fold (see images 89-96 of series 7 and coronal images 82-104 of series 15). Lymphatic: There is no gastrohepatic or hepatoduodenal ligament lymphadenopathy. No retroperitoneal or mesenteric lymphadenopathy. No pelvic sidewall lymphadenopathy. Reproductive: The prostate gland and seminal vesicles are unremarkable. Other: No intraperitoneal free fluid. Musculoskeletal: Diffuse body wall edema evident. No worrisome lytic or sclerotic osseous abnormality. IMPRESSION: 1. No evidence for active arterial phase contrast  extravasation into the gastric lumen, duodenum, small bowel, or colon. 2. There is a thin U shaped collar of fluid density with peripheral rim enhancement (perirectal abscess) posterior to and abutting the low rectum/anus over about 180 degree arc with an associated rim enhancing fistula from the left side of the low rectum/anus down to the left skin surface of the intergluteal fold. 3. Prominent calcific plaque at the ostium of the SMA with probable flow limiting stenosis. 4. Tiny 4-5 mm ulcerated plaque or penetrating ulcer anterior wall abdominal aorta just inferior to the IMA origin. 5. Advanced sigmoid diverticular disease with inspissated high density material in numerous sigmoid colon diverticuli. 6. Left lower lobe collapse with small to moderate bilateral pleural effusions, left greater than right. 7. Subtle nodularity of liver contour raises the question of cirrhosis. 8. Diffuse body wall edema. 9.  Aortic Atherosclerosis (ICD10-I70.0). Electronically Signed   By: Misty Stanley M.D.   On: 08/22/2022 06:59    Anti-infectives: Anti-infectives (From admission, onward)    Start     Dose/Rate Route Frequency Ordered Stop   08/22/22 1230  piperacillin-tazobactam (ZOSYN) IVPB 3.375 g        3.375 g 12.5 mL/hr over 240 Minutes Intravenous Every 8 hours 08/22/22 1143         Assessment/Plan: Bright red blood per rectum?   Perirectal abscess, recurrent  - CTA this AM with thin U shaped collar of fluid density with peripheral rim enhancement posterior to low rectum and anus with a fistula  - not sure clinically this is abscess at all but does have fistula -await flex sig just to make sure this is not brbpr     FEN - NPO, IVF per  TRH VTE - SCDs, no chemical prophylaxis in setting of rectal bleeding  ID - start zosyn per pharmacy Admit - [place] 08/23/2022

## 2022-08-23 NOTE — Progress Notes (Signed)
First tap water enema returned maroon tinged water. No clots seen in wash. Enema catheter gently placed with no complaint from PT. Tolerated very well.

## 2022-08-23 NOTE — Progress Notes (Addendum)
[facility] Progress Note  CC:  Painless hematochezia   Subjective: No significant rectal bleeding overnight.  He denies having any abdominal or rectal pain.  No chest pain or shortness of breath.  He remains NPO for flex sig this afternoon.  His daughter is at the bedside.   Objective:  Vital signs in last 24 hours: Temp:  [97 F (36.1 C)-98.2 F (36.8 C)] 97 F (36.1 C) (03/26 0400) Pulse Rate:  [71-90] 75 (03/26 0100) Resp:  [13-24] 18 (03/26 0600) BP: (102-139)/(57-77) 117/71 (03/26 0600) SpO2:  [91 %-100 %] 97 % (03/26 0600) Weight:  YF:[id number] kg] 69 kg (03/25 1002) Last BM Date : 08/22/22 General: 89 year old male in no acute distress. Heart: Regular rate and rhythm.  Distant S1, S2, no murmurs. Pulm: Coarse breath sounds, decreased in the bases. Abdomen: Soft, nondistended.  Nontender.  Positive bowel sounds to all 4 quadrants.  Midline upper and lower abdominal scars intact.  No palpable mass. Extremities: Trace lower extremity edema.   Neurologic:  Alert and  oriented x 4.  Speech is clear.  Moves all extremities equally. Psych:  Alert and cooperative. Normal mood and affect.  Intake/Output from previous day: 03/25 0701 - 03/26 0700 In: 99.4 [IV Piggyback:99.4] Out: 250 [Urine:250] Intake/Output this shift: No intake/output data recorded.  Lab Results: Recent Labs    08/22/22 0420 08/22/22 0646 08/22/22 1425 08/22/22 2136 08/23/22 0317  WBC 9.3  --   --   --  6.8  HGB 17.5*   < > 11.9* 15.8 14.7  HCT 57.0*   < > 39.6 52.3* 49.7  PLT 207  --   --   --  170   < > = values in this interval not displayed.   BMET Recent Labs    08/22/22 0420 08/23/22 0317  NA 138 138  K 3.4* 3.5  CL 95* 103  CO2 26 23  GLUCOSE 92 100*  BUN 33* 29*  CREATININE 1.65* 1.43*  CALCIUM 8.4* 7.8*   LFT Recent Labs    08/23/22 0317 08/23/22 0701  PROT 5.6*  --   ALBUMIN 2.6*  --   AST 23  --   ALT 16  --   ALKPHOS 152*  --   BILITOT 2.3*  --    BILIDIR  --  0.8*   PT/INR Recent Labs    08/23/22 0317  LABPROT 15.5*  INR 1.2   Hepatitis Panel No results for input(s): "HEPBSAG", "HCVAB", "HEPAIGM", "HEPBIGM" in the last 72 hours.  DG CHEST PORT 1 VIEW  Result Date: 08/22/2022 CLINICAL DATA:  Pleural effusion, atelectasis. EXAM: PORTABLE CHEST 1 VIEW COMPARISON:  06/07/2021. FINDINGS: Left chest biventricular pacemaker with leads projecting over the right atrium, right ventricle and coronary sinus. Small left and trace right pleural effusions with adjacent atelectasis in the lung bases, left-greater-than-right. Stable cardiac and mediastinal contours. No pneumothorax. IMPRESSION: Small left and trace right pleural effusions with adjacent atelectasis in the lung bases, left-greater-than-right. Electronically Signed   By: Emmit Alexanders M.D.   On: 08/22/2022 12:17   CT ANGIO GI BLEED  Result Date: 08/22/2022 CLINICAL DATA:  Lower GI bleed.  Rectal bleeding. EXAM: CTA ABDOMEN AND PELVIS WITHOUT AND WITH CONTRAST TECHNIQUE: Multidetector CT imaging of the abdomen and pelvis was performed using the standard protocol during bolus administration of intravenous contrast. Multiplanar reconstructed images and MIPs were obtained and reviewed to evaluate the vascular anatomy. RADIATION DOSE REDUCTION: This exam was performed according to the departmental  dose-optimization program which includes automated exposure control, adjustment of the mA and/or kV according to patient size and/or use of iterative reconstruction technique. CONTRAST:  53mL OMNIPAQUE IOHEXOL 350 MG/ML SOLN COMPARISON:  Abdomen and pelvis CT 06/07/2021 FINDINGS: VASCULAR Aorta: Normal caliber aorta without aneurysm, dissection, vasculitis or significant stenosis. Tiny 4-5 mm ulcerated plaque or penetrating ulcer noted anterior wall abdominal aorta just inferior to the IMA origin (axial 116/5). Prominent atherosclerotic calcification and mural thrombus noted. Celiac: Patent without  evidence of aneurysm, dissection, vasculitis or significant stenosis. SMA: Prominent calcific plaque at the ostium with probable flow limiting stenosis (see axial 75/5 and coronal 59/12). Renals: Prominent calcific plaque at the proximal renal arteries bilaterally without definite features of flow limiting stenosis. IMA: Patent without evidence of aneurysm, dissection, vasculitis or significant stenosis. Inflow: Patent without evidence of aneurysm, dissection, vasculitis or significant stenosis. Proximal Outflow: Choose 1 Veins: No obvious venous abnormality within the limitations of this arterial phase study. Review of the MIP images confirms the above findings. NON-VASCULAR Lower chest: Left lower lobe collapse noted with small to moderate bilateral pleural effusions, left greater than right. Hepatobiliary: Subtle nodularity of liver contour raises the question of cirrhosis. No evidence for focal arterial phase hyperenhancement within the parenchyma. There is no evidence for gallstones, gallbladder wall thickening, or pericholecystic fluid. No intrahepatic or extrahepatic biliary dilation. Pancreas: No focal mass lesion. No dilatation of the main duct. No intraparenchymal cyst. No peripancreatic edema. Spleen: No splenomegaly. No focal mass lesion. Adrenals/Urinary Tract: Bilateral adrenal thickening without a discrete nodule or mass. Left kidney unremarkable. Tiny hypodensity in the interpolar right kidney is likely a simple cyst. No followup imaging is recommended. No evidence for hydroureter. The urinary bladder appears normal for the degree of distention. Stomach/Bowel: Unremarkable. No evidence for arterial phase contrast extravasation into the gastric lumen. No evidence for active hemorrhage into the duodenum. No findings to suggest arterial phase extravasation of contrast in the small bowel or colon. Advanced diverticular disease is noted in the sigmoid colon with inspissated high density material in  numerous sigmoid colon diverticuli. A thin collar of fluid density measuring only a bowel 5 mm in diameter with peripheral rim enhancement is seen posterior to the low rectum/anus tracking anteriorly on both sides. This is associated with a rim enhancing fluid collection that tracks caudally in the left ischial anal fat to the left skin surface of the intergluteal fold (see images 89-96 of series 7 and coronal images 82-104 of series 15). Lymphatic: There is no gastrohepatic or hepatoduodenal ligament lymphadenopathy. No retroperitoneal or mesenteric lymphadenopathy. No pelvic sidewall lymphadenopathy. Reproductive: The prostate gland and seminal vesicles are unremarkable. Other: No intraperitoneal free fluid. Musculoskeletal: Diffuse body wall edema evident. No worrisome lytic or sclerotic osseous abnormality. IMPRESSION: 1. No evidence for active arterial phase contrast extravasation into the gastric lumen, duodenum, small bowel, or colon. 2. There is a thin U shaped collar of fluid density with peripheral rim enhancement (perirectal abscess) posterior to and abutting the low rectum/anus over about 180 degree arc with an associated rim enhancing fistula from the left side of the low rectum/anus down to the left skin surface of the intergluteal fold. 3. Prominent calcific plaque at the ostium of the SMA with probable flow limiting stenosis. 4. Tiny 4-5 mm ulcerated plaque or penetrating ulcer anterior wall abdominal aorta just inferior to the IMA origin. 5. Advanced sigmoid diverticular disease with inspissated high density material in numerous sigmoid colon diverticuli. 6. Left lower lobe collapse with small  to moderate bilateral pleural effusions, left greater than right. 7. Subtle nodularity of liver contour raises the question of cirrhosis. 8. Diffuse body wall edema. 9.  Aortic Atherosclerosis (ICD10-I70.0). Electronically Signed   By: Misty Stanley M.D.   On: 08/22/2022 06:59    Assessment /  Plan:  89 year old male with a history of a perirectal abscess s/p I & D 05/2021 admitted to the hospital 08/22/2022 with painless hematochezia.  Hg 17.5 (base line Hg 14.7 on 08/02/2022) -> Hg 14.6 -> today Hg 14.7. INR 1.2. Abdominal/pelvic CTA negative for active GI bleed but showed a U-shaped collar fluid density concerning for a perirectal abscess posterior to and abutting the low rectum/anus with a rim-enhancing fistula from the left side of the low rectum/anus down to the left skin surface of the intergluteal fold and advanced sigmoid diverticular disease with high density material in numerous sigmoid colon diverticuli. Rectal exam showed oozing red blood from a left external anorectal fistula (see photos above).  Seen by general surgery, no plans for surgical intervention at this time, agreed with flexible sigmoidoscopy. Hemodynamically stable. -Appreciate colorectal surgery recommendations -CBC in am  -Transfuse for hemoglobin less than 8 -NPO -IV fluids per the hospitalist -Proceed with flex sig as planned  -Patient to receive water enema at 9 am and 10am for flexible sigmoidoscopy at 1pm   History of tubular adenomatous colon polyps per colonoscopy 04/2012   History of GERD -PPI IV daily   Left lower lobe collapse with small to moderate bilateral pleural effusions, left greater than right per CTA. No clinical evidence of respiratory distress.   Subtle nodularity of liver contour raises the question of cirrhosis. Alk phos 152. T. Bili 2.3. Direct  bili 0.8. Indirect bili 1.5. AST 23. ALT 16.    History of CAD. CHF with LVEF 30 -35%   History of paroxysmal atrial fibrillation not on anticoagulation   PAD. CTA showed prominent calcific plaque at the ostium of the SMA with probable flow limiting stenosis, tiny 4-5 mm ulcerated plaque or penetrating ulcer anterior wall abdominal aorta just inferior to the IMA origin and aortic atherosclerosis.     DM type II    Principal Problem:    Rectal bleeding Active Problems:   Hyperlipidemia   Essential hypertension   COPD GOLD II / AB component    CKD (chronic kidney disease), stage III (HCC)   Aortic atherosclerosis (HCC)   Perirectal abscess   Coronary artery disease involving native coronary artery of native heart without angina pectoris   Chronic systolic CHF (congestive heart failure) ([place])     LOS: 0 days   Noralyn Pick  08/23/2022, 8:26AM   Attending physician's note   I have taken history, reviewed the chart and examined the patient. I performed a substantive portion of this encounter, including complete performance of at least one of the key components, in conjunction with the APP. I agree with the Advanced Practitioner's note, impression and recommendations.   FS today Trend CBC Appreciate surgical consultation.   Carmell Austria, MD [facility] [phone]

## 2022-08-23 NOTE — Op Note (Signed)
[facility] Patient Name: Daniel Woodard Procedure Date: 08/23/2022 MRN: [id number] Attending MD: Jackquline Denmark , MD, [id number] Date of Birth: [birth year] CSN: [id number] Age: 89 Admit Type: Inpatient Procedure:                Flexible Sigmoidoscopy Indications:              ? Rectal bleeding. Providers:                Jackquline Denmark, MD, Adah Perl RN, RN,                            Benetta Spar, Technician Referring MD:              Medicines:                Propofol per Anesthesia Complications:            No immediate complications. Estimated Blood Loss:     Estimated blood loss: none. Procedure:                Pre-Anesthesia Assessment:                           - Prior to the procedure, a History and Physical                            was performed, and patient medications and                            allergies were reviewed. The patient's tolerance of                            previous anesthesia was also reviewed. The risks                            and benefits of the procedure and the sedation                            options and risks were discussed with the patient.                            All questions were answered, and informed consent                            was obtained. Prior Anticoagulants: The patient has                            taken no anticoagulant or antiplatelet agents. ASA                            Grade Assessment: IV - A patient with severe                            systemic disease that is a constant threat to life.  After reviewing the risks and benefits, the patient                            was deemed in satisfactory condition to undergo the                            procedure.                           After obtaining informed consent, the scope was                            passed under direct vision. The [id number]                            [id number]) Olympus colonoscope was  introduced                            through the anus and advanced to the the rectum.                            The flexible sigmoidoscopy was accomplished without                            difficulty. The patient tolerated the procedure                            well. The quality of the bowel preparation was                            inadequate. Retained solid brown stool in the                            rectum. Scope In: Scope Out: Findings:      A left perianal fistula was noted with active bleeding on minimal touch.      A moderate amount of stool was found in the rectum, precluding       visualization. No obvious masses on lavage. No blood in the rectum. Impression:               - Preparation of the colon was inadequate.                           - Perianal fistula with active bleeding                           - Stool in the rectum.                           - No rectal masses or any active rectal bleeding. Moderate Sedation:      Not Applicable - Patient had care per Anesthesia. Recommendation:           - Return patient to hospital ward for ongoing care.                           -  Trend CBC                           - Would follow surgery recommendations. No other                            medical options.                           - MiraLAX 17 g p.o. daily to avoid constipation.                           - The findings and recommendations were discussed                            with the patient's family. Procedure Code(s):        --- Professional ---                           [id number], 52, Sigmoidoscopy, flexible; diagnostic,                            including collection of specimen(s) by brushing or                            washing, when performed (separate procedure) Diagnosis Code(s):        --- Professional ---                           K60.2, Anal fissure, unspecified                           K92.1, Melena (includes Hematochezia) CPT copyright 2022 [employer]  [employer] Association. All rights reserved. The codes documented in this report are preliminary and upon coder review may  be revised to meet current compliance requirements. Jackquline Denmark, MD 08/23/2022 12:34:39 PM This report has been signed electronically. Number of Addenda: 0

## 2022-08-24 ENCOUNTER — Inpatient Hospital Stay (HOSPITAL_COMMUNITY): Payer: Medicare Other | Admitting: Anesthesiology

## 2022-08-24 ENCOUNTER — Encounter (HOSPITAL_COMMUNITY)
Admission: EM | Disposition: A | Discharge status: Skilled Nursing Facility | Payer: Self-pay | Source: Home / Self Care | Attending: Internal Medicine

## 2022-08-24 ENCOUNTER — Encounter (HOSPITAL_COMMUNITY): Payer: Self-pay | Admitting: Internal Medicine

## 2022-08-24 DIAGNOSIS — K611 Rectal abscess: Secondary | ICD-10-CM

## 2022-08-24 DIAGNOSIS — R0989 Other specified symptoms and signs involving the circulatory and respiratory systems: Secondary | ICD-10-CM

## 2022-08-24 DIAGNOSIS — K625 Hemorrhage of anus and rectum: Secondary | ICD-10-CM | POA: Diagnosis not present

## 2022-08-24 DIAGNOSIS — I2510 Atherosclerotic heart disease of native coronary artery without angina pectoris: Secondary | ICD-10-CM

## 2022-08-24 DIAGNOSIS — J449 Chronic obstructive pulmonary disease, unspecified: Secondary | ICD-10-CM | POA: Diagnosis not present

## 2022-08-24 DIAGNOSIS — I509 Heart failure, unspecified: Secondary | ICD-10-CM

## 2022-08-24 DIAGNOSIS — Z7984 Long term (current) use of oral hypoglycemic drugs: Secondary | ICD-10-CM

## 2022-08-24 DIAGNOSIS — I10 Essential (primary) hypertension: Secondary | ICD-10-CM

## 2022-08-24 DIAGNOSIS — I5022 Chronic systolic (congestive) heart failure: Secondary | ICD-10-CM

## 2022-08-24 DIAGNOSIS — E782 Mixed hyperlipidemia: Secondary | ICD-10-CM

## 2022-08-24 DIAGNOSIS — N1832 Chronic kidney disease, stage 3b: Secondary | ICD-10-CM | POA: Diagnosis not present

## 2022-08-24 DIAGNOSIS — I110 Hypertensive heart disease with heart failure: Secondary | ICD-10-CM | POA: Diagnosis not present

## 2022-08-24 DIAGNOSIS — Z87891 Personal history of nicotine dependence: Secondary | ICD-10-CM

## 2022-08-24 DIAGNOSIS — E1151 Type 2 diabetes mellitus with diabetic peripheral angiopathy without gangrene: Secondary | ICD-10-CM

## 2022-08-24 HISTORY — PX: HEMORRHOID SURGERY: SHX153

## 2022-08-24 LAB — COMPREHENSIVE METABOLIC PANEL
ALT: 17 U/L (ref 0–44)
AST: 27 U/L (ref 15–41)
Albumin: 2.6 g/dL — ABNORMAL LOW (ref 3.5–5.0)
Alkaline Phosphatase: 165 U/L — ABNORMAL HIGH (ref 38–126)
Anion gap: 12 (ref 5–15)
BUN: 24 mg/dL — ABNORMAL HIGH (ref 8–23)
CO2: 23 mmol/L (ref 22–32)
Calcium: 7.8 mg/dL — ABNORMAL LOW (ref 8.9–10.3)
Chloride: 102 mmol/L (ref 98–111)
Creatinine, Ser: 1.44 mg/dL — ABNORMAL HIGH (ref 0.61–1.24)
GFR, Estimated: 46 mL/min — ABNORMAL LOW (ref >60)
Glucose, Bld: 103 mg/dL — ABNORMAL HIGH (ref 70–99)
Potassium: 3.6 mmol/L (ref 3.5–5.1)
Sodium: 137 mmol/L (ref 135–145)
Total Bilirubin: 2.1 mg/dL — ABNORMAL HIGH (ref 0.3–1.2)
Total Protein: 5.9 g/dL — ABNORMAL LOW (ref 6.5–8.1)

## 2022-08-24 LAB — GLUCOSE, CAPILLARY
Glucose-Capillary: 105 mg/dL — ABNORMAL HIGH (ref 70–99)
Glucose-Capillary: 115 mg/dL — ABNORMAL HIGH (ref 70–99)
Glucose-Capillary: 143 mg/dL — ABNORMAL HIGH (ref 70–99)

## 2022-08-24 LAB — CBC
HCT: 49.7 % (ref 39.0–52.0)
Hemoglobin: 14.8 g/dL (ref 13.0–17.0)
MCH: 24.5 pg — ABNORMAL LOW (ref 26.0–34.0)
MCHC: 29.8 g/dL — ABNORMAL LOW (ref 30.0–36.0)
MCV: 82.1 fL (ref 80.0–100.0)
Platelets: 161 10*3/uL (ref 150–400)
RBC: 6.05 MIL/uL — ABNORMAL HIGH (ref 4.22–5.81)
RDW: 23.2 % — ABNORMAL HIGH (ref 11.5–15.5)
WBC: 6.4 10*3/uL (ref 4.0–10.5)
nRBC: 0 % (ref 0.0–0.2)

## 2022-08-24 SURGERY — HEMORRHOIDECTOMY
Anesthesia: Monitor Anesthesia Care

## 2022-08-24 MED ORDER — LACTATED RINGERS IV SOLN: Freq: Once | INTRAVENOUS | Status: AC

## 2022-08-24 MED ORDER — ALBUTEROL SULFATE (2.5 MG/3ML) 0.083% IN NEBU
2.5000 mg | INHALATION_SOLUTION | Freq: Four times a day (QID) | RESPIRATORY_TRACT | Status: DC | PRN
  Administered 2022-08-27 – 2022-08-29 (×6): 2.5 mg via RESPIRATORY_TRACT
  Filled 2022-08-24 (×6): qty 3

## 2022-08-24 MED ORDER — LORATADINE 10 MG PO TABS
10.0000 mg | ORAL_TABLET | Freq: Every day | ORAL | Status: DC
  Administered 2022-08-24 – 2022-09-03 (×11): 10 mg via ORAL
  Filled 2022-08-24 (×11): qty 1

## 2022-08-24 MED ORDER — GUAIFENESIN ER 600 MG PO TB12
1200.0000 mg | ORAL_TABLET | Freq: Two times a day (BID) | ORAL | Status: DC
  Administered 2022-08-24 – 2022-09-03 (×19): 1200 mg via ORAL
  Filled 2022-08-24 (×20): qty 2

## 2022-08-24 MED ORDER — PROPOFOL 10 MG/ML IV BOLUS
INTRAVENOUS | Status: DC | PRN
  Administered 2022-08-24 (×4): 10 mg via INTRAVENOUS

## 2022-08-24 MED ORDER — ALBUTEROL SULFATE HFA 108 (90 BASE) MCG/ACT IN AERS: 2.0000 | INHALATION_SPRAY | Freq: Four times a day (QID) | RESPIRATORY_TRACT | Status: DC | PRN

## 2022-08-24 MED ORDER — LIDOCAINE HCL URETHRAL/MUCOSAL 2 % EX GEL
CUTANEOUS | Status: AC
  Filled 2022-08-24: qty 30

## 2022-08-24 MED ORDER — MAGNESIUM OXIDE -MG SUPPLEMENT 400 (240 MG) MG PO TABS
400.0000 mg | ORAL_TABLET | Freq: Every evening | ORAL | Status: DC
  Administered 2022-08-24 – 2022-09-01 (×9): 400 mg via ORAL
  Filled 2022-08-24 (×9): qty 1

## 2022-08-24 MED ORDER — CHLORHEXIDINE GLUCONATE 0.12 % MT SOLN: 15.0000 mL | Freq: Once | OROMUCOSAL | Status: DC

## 2022-08-24 MED ORDER — 0.9 % SODIUM CHLORIDE (POUR BTL) OPTIME
TOPICAL | Status: DC | PRN
  Administered 2022-08-24: 1000 mL

## 2022-08-24 MED ORDER — LACTATED RINGERS IV SOLN: INTRAVENOUS | Status: DC | PRN

## 2022-08-24 MED ORDER — BUPIVACAINE-EPINEPHRINE 0.25% -1:200000 IJ SOLN
INTRAMUSCULAR | Status: DC | PRN
  Administered 2022-08-24: 20 mL

## 2022-08-24 MED ORDER — PROPOFOL 1000 MG/100ML IV EMUL
INTRAVENOUS | Status: AC
  Filled 2022-08-24: qty 100

## 2022-08-24 MED ORDER — PROPOFOL 10 MG/ML IV BOLUS
INTRAVENOUS | Status: AC
  Filled 2022-08-24: qty 20

## 2022-08-24 MED ORDER — VITAMIN B-12 100 MCG PO TABS
100.0000 ug | ORAL_TABLET | Freq: Every day | ORAL | Status: DC
  Administered 2022-08-24 – 2022-09-03 (×11): 100 ug via ORAL
  Filled 2022-08-24 (×11): qty 1

## 2022-08-24 MED ORDER — FENTANYL CITRATE (PF) 100 MCG/2ML IJ SOLN
INTRAMUSCULAR | Status: AC
  Filled 2022-08-24: qty 2

## 2022-08-24 MED ORDER — PHENYLEPHRINE HCL (PRESSORS) 10 MG/ML IV SOLN
INTRAVENOUS | Status: AC
  Filled 2022-08-24: qty 1

## 2022-08-24 MED ORDER — PANTOPRAZOLE SODIUM 40 MG PO TBEC
40.0000 mg | DELAYED_RELEASE_TABLET | Freq: Every day | ORAL | Status: DC
  Administered 2022-08-24 – 2022-09-03 (×11): 40 mg via ORAL
  Filled 2022-08-24 (×11): qty 1

## 2022-08-24 MED ORDER — ALBUMIN HUMAN 25 % IV SOLN
25.0000 g | Freq: Once | INTRAVENOUS | Status: AC
  Administered 2022-08-24: 25 g via INTRAVENOUS
  Filled 2022-08-24: qty 100

## 2022-08-24 MED ORDER — LEVOTHYROXINE SODIUM 50 MCG PO TABS
50.0000 ug | ORAL_TABLET | Freq: Every day | ORAL | Status: DC
  Administered 2022-08-25 – 2022-09-03 (×10): 50 ug via ORAL
  Filled 2022-08-24 (×10): qty 1

## 2022-08-24 MED ORDER — FLUTICASONE PROPIONATE 50 MCG/ACT NA SUSP
2.0000 | Freq: Every day | NASAL | Status: DC
  Administered 2022-08-24 – 2022-09-03 (×11): 2 via NASAL
  Filled 2022-08-24: qty 16

## 2022-08-24 MED ORDER — VITAMIN D 25 MCG (1000 UNIT) PO TABS
5000.0000 [IU] | ORAL_TABLET | Freq: Every day | ORAL | Status: DC
  Administered 2022-08-24 – 2022-09-03 (×11): 5000 [IU] via ORAL
  Filled 2022-08-24 (×11): qty 5

## 2022-08-24 MED ORDER — BUPIVACAINE-EPINEPHRINE 0.25% -1:200000 IJ SOLN
INTRAMUSCULAR | Status: AC
  Filled 2022-08-24: qty 1

## 2022-08-24 MED ORDER — PROPOFOL 500 MG/50ML IV EMUL
INTRAVENOUS | Status: DC | PRN
  Administered 2022-08-24: 20 ug/kg/min via INTRAVENOUS

## 2022-08-24 MED ORDER — PHENYLEPHRINE HCL-NACL 20-0.9 MG/250ML-% IV SOLN
INTRAVENOUS | Status: DC | PRN
  Administered 2022-08-24: 50 ug/min via INTRAVENOUS

## 2022-08-24 SURGICAL SUPPLY — 40 items
BAG COUNTER SPONGE SURGICOUNT (BAG) IMPLANT
BAG SPNG CNTER NS LX DISP (BAG)
BLADE HEX COATED 2.75 (ELECTRODE) ×2 IMPLANT
BLADE SURG 15 STRL LF DISP TIS (BLADE) ×2 IMPLANT
BLADE SURG 15 STRL SS (BLADE) ×1
COVER MAYO STAND STRL (DRAPES) ×2 IMPLANT
COVER SURGICAL LIGHT HANDLE (MISCELLANEOUS) ×2 IMPLANT
ELECT REM PT RETURN 15FT ADLT (MISCELLANEOUS) ×2 IMPLANT
GAUZE 4X4 16PLY ~~LOC~~+RFID DBL (SPONGE) ×2 IMPLANT
GAUZE PACKING IODOFORM 1/2INX (GAUZE/BANDAGES/DRESSINGS) IMPLANT
GAUZE PAD ABD 8X10 STRL (GAUZE/BANDAGES/DRESSINGS) IMPLANT
GAUZE SPONGE 4X4 12PLY STRL (GAUZE/BANDAGES/DRESSINGS) IMPLANT
GLOVE BIO SURGEON STRL SZ7 (GLOVE) ×2 IMPLANT
GLOVE BIOGEL PI IND STRL 7.0 (GLOVE) ×2 IMPLANT
GLOVE BIOGEL PI IND STRL 7.5 (GLOVE) ×2 IMPLANT
GOWN STRL REUS W/ TWL LRG LVL3 (GOWN DISPOSABLE) ×4 IMPLANT
GOWN STRL REUS W/ TWL XL LVL3 (GOWN DISPOSABLE) ×2 IMPLANT
GOWN STRL REUS W/TWL LRG LVL3 (GOWN DISPOSABLE) ×2
GOWN STRL REUS W/TWL XL LVL3 (GOWN DISPOSABLE) ×1
KIT BASIN OR (CUSTOM PROCEDURE TRAY) ×2 IMPLANT
KIT TURNOVER KIT A (KITS) IMPLANT
NDL HYPO 25X1 1.5 SAFETY (NEEDLE) ×2 IMPLANT
NDL SAFETY ECLIP 18X1.5 (MISCELLANEOUS) IMPLANT
NEEDLE HYPO 25X1 1.5 SAFETY (NEEDLE) ×1 IMPLANT
NS IRRIG 1000ML POUR BTL (IV SOLUTION) ×2 IMPLANT
PACK BASIC VI WITH GOWN DISP (CUSTOM PROCEDURE TRAY) ×2 IMPLANT
PENCIL SMOKE EVACUATOR (MISCELLANEOUS) IMPLANT
SOL SCRUB PVP POV-IOD 4OZ 7.5% (MISCELLANEOUS) ×1
SOLUTION SCRB POV-IOD 4OZ 7.5% (MISCELLANEOUS) ×2 IMPLANT
SPIKE FLUID TRANSFER (MISCELLANEOUS) ×2 IMPLANT
SPONGE SURGIFOAM ABS GEL 12-7 (HEMOSTASIS) ×6 IMPLANT
SURGILUBE 2OZ TUBE FLIPTOP (MISCELLANEOUS) ×2 IMPLANT
SUT CHROMIC 2 0 SH (SUTURE) IMPLANT
SUT CHROMIC 3 0 SH 27 (SUTURE) IMPLANT
SUT MON AB 3-0 SH 27 (SUTURE)
SUT MON AB 3-0 SH27 (SUTURE) IMPLANT
SUT VIC AB 4-0 P-3 18XBRD (SUTURE) IMPLANT
SUT VIC AB 4-0 P3 18 (SUTURE)
SYR CONTROL 10ML LL (SYRINGE) ×2 IMPLANT
TOWEL OR 17X26 10 PK STRL BLUE (TOWEL DISPOSABLE) ×2 IMPLANT

## 2022-08-24 NOTE — Progress Notes (Signed)
Progress Note  1 Day Post-Op  Subjective: Pt denies significant rectal pain, unsure if he is still having any bleeding. Daughter at the bedside and called her sister while I was present as well. They are agreeable to proceed with EUA today to try to localize bleeding and address.   Objective: Vital signs in last 24 hours: Temp:  [97.2 F (36.2 C)-97.6 F (36.4 C)] 97.5 F (36.4 C) (03/27 0600) Pulse Rate:  [69-89] 84 (03/27 0600) Resp:  [12-24] 19 (03/27 0600) BP: (89-130)/(64-91) 117/82 (03/27 0600) SpO2:  [87 %-100 %] 98 % (03/27 0600) Weight:  [69 kg] 69 kg (03/26 1152) Last BM Date : 08/23/22  Intake/Output from previous day: 03/26 0701 - 03/27 0700 In: 775.1 [I.V.:635.2; IV Piggyback:139.9] Out: 550 [Urine:550] Intake/Output this shift: No intake/output data recorded.  PE: General: pleasant, WD, elderly male who is laying in bed in NAD Heart: regular, rate, and rhythm.  Lungs:  Respiratory effort nonlabored Abd: soft, NT, ND GU: BRBPR present on perianal skin and bed pad, small amount, no induration externally  MS: all 4 extremities are symmetrical with no cyanosis, clubbing, or edema. Psych: A&Ox3 with an appropriate affect.    Lab Results:  Recent Labs    08/23/22 1651 08/24/22 0311  WBC 6.6 6.4  HGB 15.6 14.8  HCT 51.3 49.7  PLT 188 161   BMET Recent Labs    08/23/22 0317 08/24/22 0311  NA 138 137  K 3.5 3.6  CL 103 102  CO2 23 23  GLUCOSE 100* 103*  BUN 29* 24*  CREATININE 1.43* 1.44*  CALCIUM 7.8* 7.8*   PT/INR Recent Labs    08/23/22 0317  LABPROT 15.5*  INR 1.2   CMP     Component Value Date/Time   NA 137 08/24/2022 0311   NA 138 06/30/2022 1120   K 3.6 08/24/2022 0311   CL 102 08/24/2022 0311   CO2 23 08/24/2022 0311   GLUCOSE 103 (H) 08/24/2022 0311   BUN 24 (H) 08/24/2022 0311   BUN 44 (H) 06/30/2022 1120   CREATININE 1.44 (H) 08/24/2022 0311   CREATININE 1.39 (H) 07/05/2019 1538   CALCIUM 7.8 (L) 08/24/2022 0311    PROT 5.9 (L) 08/24/2022 0311   ALBUMIN 2.6 (L) 08/24/2022 0311   AST 27 08/24/2022 0311   ALT 17 08/24/2022 0311   ALKPHOS 165 (H) 08/24/2022 0311   BILITOT 2.1 (H) 08/24/2022 0311   GFRNONAA 46 (L) 08/24/2022 0311   GFRAA 46 (L) 03/07/2019 0322   Lipase     Component Value Date/Time   LIPASE 35 10/03/2020 1730       Studies/Results: DG CHEST PORT 1 VIEW  Result Date: 08/22/2022 CLINICAL DATA:  Pleural effusion, atelectasis. EXAM: PORTABLE CHEST 1 VIEW COMPARISON:  06/07/2021. FINDINGS: Left chest biventricular pacemaker with leads projecting over the right atrium, right ventricle and coronary sinus. Small left and trace right pleural effusions with adjacent atelectasis in the lung bases, left-greater-than-right. Stable cardiac and mediastinal contours. No pneumothorax. IMPRESSION: Small left and trace right pleural effusions with adjacent atelectasis in the lung bases, left-greater-than-right. Electronically Signed   By: Emmit Alexanders M.D.   On: 08/22/2022 12:17    Anti-infectives: Anti-infectives (From admission, onward)    Start     Dose/Rate Route Frequency Ordered Stop   08/22/22 1230  piperacillin-tazobactam (ZOSYN) IVPB 3.375 g        3.375 g 12.5 mL/hr over 240 Minutes Intravenous Every 8 hours 08/22/22 1143  Assessment/Plan  Bright red blood per rectum Perirectal abscess, recurrent  - CTA with thin U shaped collar of fluid density with peripheral rim enhancement posterior to low rectum and anus with a fistula  - not sure clinically this is abscess at all but does have fistula - s/p flex-sig yesterday without clear source of bleeding just bloody drainage from fistula tract - plan for EUA with possible I&D today in the OR, patient and daughters in agreement     FEN - NPO, IVF per TRH VTE - SCDs, no chemical prophylaxis in setting of rectal bleeding  ID - zosyn 3/25>>  LOS: 1 day   I reviewed Consultant GI notes, hospitalist notes, last 24 h vitals and  pain scores, last 48 h intake and output, last 24 h labs and trends, and last 24 h imaging results.    Norm Parcel, [facility] 08/24/2022, 8:14 AM Please see Amion for pager number during day hours 7:00am-4:30pm

## 2022-08-24 NOTE — Plan of Care (Signed)
  Problem: Safety: Goal: Ability to remain free from injury will improve Outcome: Progressing   

## 2022-08-24 NOTE — Anesthesia Preprocedure Evaluation (Addendum)
Anesthesia Evaluation  Patient identified by MRN, date of birth, ID band Patient awake    Reviewed: Allergy & Precautions, NPO status , Patient's Chart, lab work & pertinent test results, reviewed documented beta blocker date and time   Airway Mallampati: II  TM Distance: >3 FB Neck ROM: Full    Dental  (+) Edentulous Upper, Edentulous Lower, Dental Advisory Given   Pulmonary COPD,  COPD inhaler, former smoker   + rhonchi        Cardiovascular hypertension, Pt. on home beta blockers and Pt. on medications + CAD, + Peripheral Vascular Disease and +CHF  Normal cardiovascular exam+ dysrhythmias Atrial Fibrillation + pacemaker + Valvular Problems/Murmurs (mod MR, mod/severe AS) AS and MR  Rhythm:Regular Rate:Normal  TTE 2024 1. Left ventricular ejection fraction, by estimation, is 30 to 35%. The  left ventricle has moderately decreased function. The left ventricle  demonstrates global hypokinesis with paradoxical septal motion in the  setting of RV pacing. There is mild  concentric left ventricular hypertrophy. Diastolic function indeterminant  due to severe MAC. Elevated left atrial pressure.   2. The aortic valve is calcified. There is severe calcifcation of the  aortic valve. There is severe thickening of the aortic valve. Aortic valve  regurgitation is mild. There is moderate-to-severe low flow, low gradient  aortic stenosis. Aortic valve  area, by VTI measures 1.08 cm. Aortic valve mean gradient measures 9.0  mmHg. Aortic valve Vmax measures 2.12 m/s. DI 0.34, SVi 22cc/m2.   3. Right ventricular systolic function is mildly reduced. The right  ventricular size is mildly enlarged.   4. The mitral valve is degenerative. Moderate mitral valve regurgitation.  Mild mitral stenosis. Severe mitral annular calcification.   5. Tricuspid valve regurgitation is mild to moderate.   6. Left atrial size was mildly dilated.   7. Right atrial  size was mildly dilated.   Cath 2022  Prox Cx lesion is 25% stenosed.  Mid LAD lesion is 50-60% stenosed. Heavily calcified at the origin of the diagonal.  2nd Diag lesion is 50% stenosed.  LV end diastolic pressure is normal.  There is no aortic valve stenosis.    Neuro/Psych  PSYCHIATRIC DISORDERS  Depression    negative neurological ROS     GI/Hepatic negative GI ROS, Neg liver ROS,,,  Endo/Other  diabetes, Type 2, Oral Hypoglycemic Agents    Renal/GU Renal InsufficiencyRenal diseaseLab Results      Component                Value               Date                      CREATININE               1.44 (H)            08/24/2022                BUN                      24 (H)              08/24/2022                NA                       137  08/24/2022                K                        3.6                 08/24/2022                CL                       102                 08/24/2022                CO2                      23                  08/24/2022             negative genitourinary   Musculoskeletal negative musculoskeletal ROS (+)    Abdominal   Peds  Hematology negative hematology ROS (+)   Anesthesia Other Findings   Reproductive/Obstetrics                              Anesthesia Physical Anesthesia Plan  ASA: 4  Anesthesia Plan: MAC   Post-op Pain Management:    Induction: Intravenous  PONV Risk Score and Plan: Propofol infusion and Treatment may vary due to age or medical condition  Airway Management Planned: Natural Airway  Additional Equipment:   Intra-op Plan:   Post-operative Plan:   Informed Consent: I have reviewed the patients History and Physical, chart, labs and discussed the procedure including the risks, benefits and alternatives for the proposed anesthesia with the patient or authorized representative who has indicated his/her understanding and acceptance.   Patient has DNR.   Discussed DNR with power of attorney and Suspend DNR.   Dental advisory given  Plan Discussed with: CRNA  Anesthesia Plan Comments:        Anesthesia Quick Evaluation

## 2022-08-24 NOTE — Op Note (Signed)
Preoperative diagnosis: perirectal abscess, recurrent , possible fistula Postoperative diagnosis: saa, could not confidently identify fistulous connectoin Procedure: EUA, drainage of small recurrent perirectal abscess Dr Serita Grammes Anes: local MAC EBL minimal Complications none Drains none Specimens none Sponge and needle count correct Dispo recovery stable  Indications: This is a 33 yom with prior perirectal abscess that was drained. He has bloody drainage from a site near anus. He has flex sig that shows no brbpr.  I discussed EUA with am and if I could identify fistula possibly treat this.  Procedure: After informed consent obtained he was taken to OR.  SCDs in place. He was placed under MAC. He was placed in lithotomy and appropriately padded.  He was prepped and draped in standard sterile surgical fashion. Timeout performed.  The opening was draining some bloody fluid.  This was at 4 oclock a couple cm from anus. No abnormality on rectal exam noted.  I inserted an anoscope and was unable to identify any abnormality there either.  I then opened the small hole a little bigger and entered into a large cavity with some fluid and small amount purulence that was seen on ct scan.  I cleaned this out.  I then packed this with iodoform. He tolerated well and was transferred to recovery stable

## 2022-08-24 NOTE — Anesthesia Postprocedure Evaluation (Signed)
Anesthesia Post Note  Patient: Daniel Woodard  Procedure(s) Performed: EUA AND INCISION AND DRAINAGE RECTAL     Patient location during evaluation: PACU Anesthesia Type: MAC Level of consciousness: sedated Pain management: pain level controlled Vital Signs Assessment: post-procedure vital signs reviewed and stable Respiratory status: nonlabored ventilation, spontaneous breathing, respiratory function stable and patient connected to nasal cannula oxygen Cardiovascular status: stable and blood pressure returned to baseline Postop Assessment: no apparent nausea or vomiting Anesthetic complications: no   No notable events documented.  Last Vitals:  Vitals:   08/24/22 1345 08/24/22 1400  BP:  105/73  Pulse:  79  Resp: 17 14  Temp:  (!) 36.3 C  SpO2:  100%    Last Pain:  Vitals:   08/24/22 1400  TempSrc: Oral  PainSc:                  Sharonlee Nine,E. Nadalee Neiswender

## 2022-08-24 NOTE — Transfer of Care (Signed)
Immediate Anesthesia Transfer of Care Note  Patient: Daniel Woodard  Procedure(s) Performed: EUA AND INCISION AND DRAINAGE RECTAL  Patient Location: PACU  Anesthesia Type:MAC  Level of Consciousness: sedated  Airway & Oxygen Therapy: Patient Spontanous Breathing and Patient connected to face mask oxygen  Post-op Assessment: Report given to RN and Post -op Vital signs reviewed and stable  Post vital signs: Reviewed and stable  Last Vitals:  Vitals Value Taken Time  BP 101/78 08/24/22 1307  Temp 36.4 C 08/24/22 1307  Pulse 78 08/24/22 1309  Resp 18 08/24/22 1309  SpO2 96 % 08/24/22 1309  Vitals shown include unvalidated device data.  Last Pain:  Vitals:   08/24/22 1307  TempSrc:   PainSc: 0-No pain         Complications: No notable events documented.

## 2022-08-24 NOTE — Progress Notes (Signed)
PROGRESS NOTE    Daniel Woodard  [id number] DOB: 16-Jun-1929 DOA: 08/22/2022 PCP: Biagio Borg, MD    Chief Complaint  Patient presents with   Rectal Bleeding    Brief Narrative:   89 yo male with pmh of allergic rhinitis, atrial fibrillation not on anticoagulation MAC bacteremia, BPH, bradycardia, carotid artery occlusion, colon polyps, complete heart block, permanent pacemaker placement, COPD, depression, type 2 diabetes, colon diverticulosis, hyperlipidemia, hypertension, PVD, history of pneumonia, history of herpes zoster, perirectal abscess in January 2023 requiring an incision and drainage now presents to the ER with bright red bleeding with blood clots.  Denied abdominal pain nausea vomiting constipation or melena.  Denied fever or chills.  He lost about 8 pounds. Admission hemoglobin was 17.5 repeat was 14.6.   CTA -negative for active GI bleed but showed a U-shaped collar fluid density concerning for a perirectal abscess posterior to and abutting the low rectum/anus with an associated rim-enhancing fistula from the left side of the low rectum/anus down to the left skin surface of the intergluteal fold. Other CTA findings included prominent calcific plaque at the ostium of the SMA with probable stenosis, tiny 4-5 mm ulcerated plaque or penetrating ulcer anterior wall abdominal aorta just inferior to the IMA origin, left lower lobe collapse with small to moderate bilateral pleural effusions (left greater than right), subtle nodularity of liver contour suggestive of possible cirrhosis, diffuse body wall edema and aortic atherosclerosis.  Patient seen in consultation by gastroenterology, Dr. Lyndel Safe who recommended flexible sigmoidoscopy for further evaluation which was done 08/23/2022 without clear source of bleeding, bloody drainage noted from fistula tract.  Patient being followed by general surgery and patient underwent EUA with I&D of small recurrent perirectal abscess, 12/24/2022.   Patient also placed empirically on IV antibiotics.   Assessment & Plan:   Principal Problem:   Rectal bleeding Active Problems:   Hyperlipidemia   Essential hypertension   COPD GOLD II / AB component    CKD (chronic kidney disease), stage III (HCC)   Aortic atherosclerosis (HCC)   Perirectal abscess   Coronary artery disease involving native coronary artery of native heart without angina pectoris   Chronic systolic CHF (congestive heart failure) (HCC)   Protein-calorie malnutrition, severe  #1 concern for perianal fistula with active bleeding/recurrent perirectal abscess -Patient appears to be with some rectal bleeding, CT scan done with U-shaped color fluid density with peripheral rim enhancement posterior to and abutting the lower rectum and anus. -Patient underwent flexible sigmoidoscopy by GI, with inadequate colonic prep, perianal fistula with active bleeding and stool in the rectum was noted. -General surgery following and patient subsequently underwent EUA, drainage of small recurrent perirectal abscess, 08/24/2022 -Hemoglobin currently at 14.8 from 17.5 on day of admission. -Continue empiric IV Zosyn. -Per general surgery.  2.  Hyperlipidemia -Statin.  3.  Chest Congestion -Per daughter patient congested over the past 2 months, noted to have diffuse rhonchorous breath sounds on examination. -Chest x-ray done on admission negative for any acute infiltrate. -Patient currently afebrile. -Normal white count. -Currently empirically on IV Zosyn due to concern for recurrent perirectal abscess. -Placed on Mucinex, Claritin, Flonase, incentive spirometry, flutter valve. -Supportive care.  4.  Hypertension -Blood pressure soft and as such Lasix and metoprolol held. -IV albumin x 1.  5.  CAD -Beta-blocker on hold due to soft blood pressure. -Statin on hold and will resume on discharge.  6.  History of chronic systolic heart failure -Stable. -Not volume overloaded on exam,  seems more on the dry side. -Due to soft blood pressure with metoprolol and Lasix held. -Monitor closely with gentle hydration.  7.  CKD stage IIIb -Stable. -Monitor volume status closely.  8.  Hypokalemia -Repleted.   DVT prophylaxis: SCDs Code Status: Full Family Communication: Updated patient, daughter at bedside. Disposition: TBD  Status is: Inpatient Remains inpatient appropriate because: Severity of illness   Consultants:  Gastroenterology: Dr. Lyndel Safe 08/22/2022 General surgery: Dr. Donne Hazel 08/22/2022  Procedures:  Chest x-ray 08/22/2022 CT angiogram abdomen and pelvis 08/22/2022 Flexible sigmoidoscopy per GI: Dr. Lyndel Safe 08/23/2022 EUA, drainage of small recurrent perirectal abscess per general surgery: Dr. Donne Hazel 08/24/2022  Antimicrobials:  IV Zosyn 08/22/2022>>>>>   Subjective: Sitting up in bed.  Denies any chest pain.  No significant shortness of breath.  Noted to have had a rhonchorous congested nonproductive cough per daughter over the past 2 months.  Just returned from the OR.  Denies any rectal bleeding today.  Objective: Vitals:   08/24/22 1315 08/24/22 1330 08/24/22 1345 08/24/22 1400  BP: 105/80 91/77  105/73  Pulse:  78  79  Resp: 16 17 17 14   Temp:    (!) 97.4 F (36.3 C)  TempSrc:    Oral  SpO2: 97% 97%  100%  Weight:      Height:        Intake/Output Summary (Last 24 hours) at 08/24/2022 1553 Last data filed at 08/24/2022 1300 Gross per 24 hour  Intake 1252.6 ml  Output 700 ml  Net 552.6 ml   Filed Weights   08/23/22 1152 08/24/22 1020 08/24/22 1106  Weight: 69 kg 70.9 kg 70.9 kg    Examination:  General exam: Appears calm and comfortable.  Dry mucous membranes. Respiratory system: Diffuse coarse rhonchorous breath sounds.  No wheezing.  No crackles.  Fair air movement.  Speaking in full sentences.   Cardiovascular system: S1 & S2 heard, RRR. No JVD, murmurs, rubs, gallops or clicks. No pedal edema. Gastrointestinal system: Abdomen  is nondistended, soft and nontender. No organomegaly or masses felt. Normal bowel sounds heard. Central nervous system: Alert and oriented. No focal neurological deficits. Extremities: Symmetric 5 x 5 power. Skin: No rashes, lesions or ulcers Psychiatry: Judgement and insight appear normal. Mood & affect appropriate.     Data Reviewed: I have personally reviewed following labs and imaging studies  CBC: Recent Labs  Lab 08/22/22 0420 08/22/22 0646 08/22/22 1425 08/22/22 2136 08/23/22 0317 08/23/22 1651 08/24/22 0311  WBC 9.3  --   --   --  6.8 6.6 6.4  NEUTROABS 7.7  --   --   --   --   --   --   HGB 17.5*   < > 11.9* 15.8 14.7 15.6 14.8  HCT 57.0*   < > 39.6 52.3* 49.7 51.3 49.7  MCV 79.1*  --   --   --  82.3 81.2 82.1  PLT 207  --   --   --  170 188 161   < > = values in this interval not displayed.    Basic Metabolic Panel: Recent Labs  Lab 08/22/22 0420 08/23/22 0317 08/24/22 0311  NA 138 138 137  K 3.4* 3.5 3.6  CL 95* 103 102  CO2 26 23 23   GLUCOSE 92 100* 103*  BUN 33* 29* 24*  CREATININE 1.65* 1.43* 1.44*  CALCIUM 8.4* 7.8* 7.8*    GFR: Estimated Creatinine Clearance: 34.2 mL/min (A) (by C-G formula based on SCr of 1.44 mg/dL (H)).  Liver Function Tests: Recent Labs  Lab 08/23/22 0317 08/24/22 0311  AST 23 27  ALT 16 17  ALKPHOS 152* 165*  BILITOT 2.3* 2.1*  PROT 5.6* 5.9*  ALBUMIN 2.6* 2.6*    CBG: Recent Labs  Lab 08/23/22 1207 08/23/22 1923 08/24/22 0002 08/24/22 0607 08/24/22 1403  GLUCAP 78 106* 143* 115* 105*     Recent Results (from the past 240 hour(s))  MRSA Next Gen by PCR, Nasal     Status: None   Collection Time: 08/22/22  8:59 PM   Specimen: Nasal Mucosa; Nasal Swab  Result Value Ref Range Status   MRSA by PCR Next Gen NOT DETECTED NOT DETECTED Final    Comment: (NOTE) The GeneXpert MRSA Assay (FDA approved for NASAL specimens only), is one component of a comprehensive MRSA colonization surveillance program. It is  not intended to diagnose MRSA infection nor to guide or monitor treatment for MRSA infections. Test performance is not FDA approved in patients less than 58 years old. Performed at [facility], [place] [address] [place], [place] [address]          Radiology Studies: No results found.      Scheduled Meds:  chlorhexidine  15 mL Mouth/Throat Once   Chlorhexidine Gluconate Cloth  6 each Topical Daily   feeding supplement  237 mL Oral BID BM   pantoprazole (PROTONIX) IV  40 mg Intravenous Q24H   Continuous Infusions:  sodium chloride 75 mL/hr at 08/24/22 1359   piperacillin-tazobactam (ZOSYN)  IV 3.375 g (08/24/22 1401)     LOS: 1 day    Time spent: 35 minutes    Irine Seal, MD [facility]   To contact the attending provider between 7A-7P or the covering provider during after hours 7P-7A, please log into the web site [email or web address] and access using universal Painter password for that web site. If you do not have the password, please call the hospital operator.  08/24/2022, 3:53 PM

## 2022-08-24 NOTE — Progress Notes (Signed)
Left upper arm with discoloration and swelling  IV site. Skin oozing clear liquid at times 2x2 applied. No odor and denies discomfort.

## 2022-08-24 NOTE — Plan of Care (Signed)
  Problem: Education: Goal: Knowledge of General Education information will improve Description: Including pain rating scale, medication(s)/side effects and non-pharmacologic comfort measures Outcome: Progressing   Problem: Coping: Goal: Level of anxiety will decrease Outcome: Progressing   Problem: Safety: Goal: Ability to remain free from injury will improve Outcome: Progressing   

## 2022-08-25 ENCOUNTER — Encounter (HOSPITAL_COMMUNITY): Payer: Self-pay | Admitting: General Surgery

## 2022-08-25 DIAGNOSIS — J449 Chronic obstructive pulmonary disease, unspecified: Secondary | ICD-10-CM | POA: Diagnosis not present

## 2022-08-25 DIAGNOSIS — N1832 Chronic kidney disease, stage 3b: Secondary | ICD-10-CM | POA: Diagnosis not present

## 2022-08-25 DIAGNOSIS — I10 Essential (primary) hypertension: Secondary | ICD-10-CM | POA: Diagnosis not present

## 2022-08-25 DIAGNOSIS — K625 Hemorrhage of anus and rectum: Secondary | ICD-10-CM | POA: Diagnosis not present

## 2022-08-25 LAB — COMPREHENSIVE METABOLIC PANEL
ALT: 17 U/L (ref 0–44)
AST: 25 U/L (ref 15–41)
Albumin: 3 g/dL — ABNORMAL LOW (ref 3.5–5.0)
Alkaline Phosphatase: 148 U/L — ABNORMAL HIGH (ref 38–126)
Anion gap: 10 (ref 5–15)
BUN: 21 mg/dL (ref 8–23)
CO2: 25 mmol/L (ref 22–32)
Calcium: 8 mg/dL — ABNORMAL LOW (ref 8.9–10.3)
Chloride: 102 mmol/L (ref 98–111)
Creatinine, Ser: 1.36 mg/dL — ABNORMAL HIGH (ref 0.61–1.24)
GFR, Estimated: 49 mL/min — ABNORMAL LOW (ref >60)
Glucose, Bld: 126 mg/dL — ABNORMAL HIGH (ref 70–99)
Potassium: 3.2 mmol/L — ABNORMAL LOW (ref 3.5–5.1)
Sodium: 137 mmol/L (ref 135–145)
Total Bilirubin: 1.9 mg/dL — ABNORMAL HIGH (ref 0.3–1.2)
Total Protein: 5.9 g/dL — ABNORMAL LOW (ref 6.5–8.1)

## 2022-08-25 LAB — GLUCOSE, CAPILLARY
Glucose-Capillary: 141 mg/dL — ABNORMAL HIGH (ref 70–99)
Glucose-Capillary: 143 mg/dL — ABNORMAL HIGH (ref 70–99)
Glucose-Capillary: 155 mg/dL — ABNORMAL HIGH (ref 70–99)

## 2022-08-25 LAB — CBC
HCT: 45.6 % (ref 39.0–52.0)
Hemoglobin: 13.7 g/dL (ref 13.0–17.0)
MCH: 24.6 pg — ABNORMAL LOW (ref 26.0–34.0)
MCHC: 30 g/dL (ref 30.0–36.0)
MCV: 81.7 fL (ref 80.0–100.0)
Platelets: 149 10*3/uL — ABNORMAL LOW (ref 150–400)
RBC: 5.58 MIL/uL (ref 4.22–5.81)
RDW: 23.3 % — ABNORMAL HIGH (ref 11.5–15.5)
WBC: 5.5 10*3/uL (ref 4.0–10.5)
nRBC: 0 % (ref 0.0–0.2)

## 2022-08-25 LAB — MAGNESIUM: Magnesium: 2.3 mg/dL (ref 1.7–2.4)

## 2022-08-25 MED ORDER — TRAMADOL HCL 50 MG PO TABS
50.0000 mg | ORAL_TABLET | ORAL | Status: AC | PRN
  Administered 2022-08-25: 50 mg via ORAL
  Filled 2022-08-25: qty 1

## 2022-08-25 MED ORDER — POTASSIUM CHLORIDE CRYS ER 10 MEQ PO TBCR
40.0000 meq | EXTENDED_RELEASE_TABLET | Freq: Once | ORAL | Status: AC
  Administered 2022-08-25: 40 meq via ORAL
  Filled 2022-08-25: qty 4

## 2022-08-25 NOTE — Evaluation (Signed)
Occupational Therapy Evaluation Patient Details Name: Daniel Woodard MRN: [id number] DOB: 02-08-1930 Today's Date: 08/25/2022   History of Present Illness Daniel Woodard is a 89 yo male s/p flex-sig 3/26, S/p EUA, drainage of small recurrent perirectal abscess 08/24/22 by Dr. Donne Hazel. PMH: afib, bacteremia, complete heart block, COPD, diabetes, HTN, PVD, shingles, permanent pacemaker insertion   Clinical Impression   Pt presents with decline in function and safety with ADLs and ADL mobility with impaired strength, balance and endurance. PTA pt lived at home alone and was Ind with ADLs/selfcare and used a RW for mobility. Per pt's daughter, for the last 6 weeks she and her sister have been staying with pt and providing 24 hr care due to repeated falls and pt with an overall decline and requiring assist with selfcare. Pt currently requires max A with LB ADLs, total A with toileting, max - mod A with mobility using RW, min A with UB ADLs (pt with posterior lean when seated). Pt would benefit from acute OT services to address impairments to maximize level of function and safety     Recommendations for follow up therapy are one component of a multi-disciplinary discharge planning process, led by the attending physician.  Recommendations may be updated based on patient status, additional functional criteria and insurance authorization.   Assistance Recommended at Discharge    Patient can return home with the following A lot of help with walking and/or transfers;A little help with walking and/or transfers;Direct supervision/assist for medications management;Assist for transportation;Help with stairs or ramp for entrance    Functional Status Assessment  Patient has had a recent decline in their functional status and demonstrates the ability to make significant improvements in function in a reasonable and predictable amount of time.  Equipment Recommendations  Other (comment) (TBD at next venue of care)     Recommendations for Other Services       Precautions / Restrictions Precautions Precautions: Fall Restrictions Weight Bearing Restrictions: No      Mobility Bed Mobility               General bed mobility comments: pt in recliner upon arrival    Transfers Overall transfer level: Needs assistance Equipment used: Rolling walker (2 wheels) Transfers: Sit to/from Stand, Bed to chair/wheelchair/BSC Sit to Stand: Max assist     Step pivot transfers: Mod assist     General transfer comment: pt with knees flexed, Pt began having BM once standing with RW in route to [facility]      Balance Overall balance assessment: Needs assistance Sitting-balance support: Feet supported Sitting balance-Leahy Scale: Fair     Standing balance support: Reliant on assistive device for balance, During functional activity, Bilateral upper extremity supported Standing balance-Leahy Scale: Poor                             ADL either performed or assessed with clinical judgement   ADL Overall ADL's : Needs assistance/impaired Eating/Feeding: Independent;Sitting   Grooming: Wash/dry hands;Wash/dry face;Min guard;Sitting   Upper Body Bathing: Minimal assistance;Sitting Upper Body Bathing Details (indicate cue type and reason): Posterior leaning Lower Body Bathing: Maximal assistance   Upper Body Dressing : Minimal assistance Upper Body Dressing Details (indicate cue type and reason): Posterior leaning Lower Body Dressing: Maximal assistance   Toilet Transfer: Moderate assistance;Rolling walker (2 wheels);Stand-pivot;Cueing for safety   Toileting- Clothing Manipulation and Hygiene: Total assistance       Functional mobility during ADLs:  Maximal assistance;Moderate assistance;Rolling walker (2 wheels);Cueing for safety       Vision Baseline Vision/History: 1 Wears glasses Patient Visual Report: No change from baseline       Perception     Praxis      Pertinent  Vitals/Pain Pain Assessment Pain Assessment: No/denies pain     Hand Dominance Right   Extremity/Trunk Assessment Upper Extremity Assessment Upper Extremity Assessment: Generalized weakness   Lower Extremity Assessment Lower Extremity Assessment: Defer to PT evaluation   Cervical / Trunk Assessment Cervical / Trunk Assessment: Kyphotic   Communication Communication Communication: No difficulties   Cognition Arousal/Alertness: Awake/alert Behavior During Therapy: WFL for tasks assessed/performed Overall Cognitive Status: Within Functional Limits for tasks assessed                                 General Comments: pt pleasant, cooperative and jovial     General Comments       Exercises     Shoulder Instructions      Home Living Family/patient expects to be discharged to:: Skilled nursing facility Living Arrangements: Children                               Additional Comments: Per daughter, she and her sister are currently providing 24/7 assist in the last 6 weeks to pt in single level home, no stairs, but they are no longer able to care for him.      Prior Functioning/Environment Prior Level of Function : Needs assist             Mobility Comments: daughter reports pt needing assisting with stand pivot transfers, minimal amb with RW, ~3 falls recently, was using RW 6 weeks ago ADLs Comments: daughter reports pt using urinal, total A for showering and A for toileting. pt was Ind with ADLs 6 weeks ago with selfcare        OT Problem List: Decreased strength;Decreased activity tolerance      OT Treatment/Interventions: Self-care/ADL training;Therapeutic exercise;Balance training;Splinting;Therapeutic activities;Patient/family education    OT Goals(Current goals can be found in the care plan section) Acute Rehab OT Goals Patient Stated Goal: feel better OT Goal Formulation: With patient/family Time For Goal Achievement:  09/08/22 Potential to Achieve Goals: Good ADL Goals Pt Will Perform Grooming: with supervision;with set-up;sitting Pt Will Perform Upper Body Bathing: with min guard assist;sitting Pt Will Perform Lower Body Bathing: with mod assist;sitting/lateral leans Pt Will Perform Upper Body Dressing: with min guard assist;sitting Pt Will Transfer to Toilet: with mod assist;with min assist;ambulating;stand pivot transfer Pt Will Perform Toileting - Clothing Manipulation and hygiene: with max assist;with mod assist;sit to/from stand;sitting/lateral leans  OT Frequency: Min 2X/week    Co-evaluation              AM-PAC OT "6 Clicks" Daily Activity     Outcome Measure Help from another person eating meals?: None Help from another person taking care of personal grooming?: A Little Help from another person toileting, which includes using toliet, bedpan, or urinal?: A Lot Help from another person bathing (including washing, rinsing, drying)?: A Lot Help from another person to put on and taking off regular upper body clothing?: A Little Help from another person to put on and taking off regular lower body clothing?: Total 6 Click Score: 15   End of Session Equipment Utilized During Treatment: Gait belt;Rolling walker (2  wheels);Other (comment) (BSC)  Activity Tolerance: Patient limited by fatigue Patient left: in chair;with call bell/phone within reach;with chair alarm set;with family/visitor present  OT Visit Diagnosis: Unsteadiness on feet (R26.81);Other abnormalities of gait and mobility (R26.89);Repeated falls (R29.6);Muscle weakness (generalized) (M62.81)                Time: [id number] OT Time Calculation (min): 24 min Charges:  OT General Charges $OT Visit: 1 Visit OT Evaluation $OT Eval Moderate Complexity: 1 Mod OT Treatments $Therapeutic Activity: 8-22 mins    Britt Bottom 08/25/2022, 12:47 PM

## 2022-08-25 NOTE — NC FL2 (Signed)
[place] LEVEL OF CARE FORM     IDENTIFICATION  Patient Name: Daniel Woodard Birthdate: [birth year] Sex: male Admission Date (Current Location): 08/22/2022  [facility] and [place] Number:  Herbalist and Address:  [facility],  [place], [place]      Provider Number: [id number]  Attending Physician Name and Address:  Eugenie Filler, MD  Relative Name and Phone Number:   Jeannene Patella Myers(dtr) [phone])    Current Level of Care: Hospital Recommended Level of Care: [place] Prior Approval Number:    Date Approved/Denied:   PASRR Number:  ([id number])  Discharge Plan: SNF    Current Diagnoses: Patient Active Problem List   Diagnosis Date Noted   Chest congestion 08/24/2022   Protein-calorie malnutrition, severe 08/23/2022   Rectal bleeding [id number]   Chronic systolic CHF (congestive heart failure) ([place]) 08/22/2022   Generalized weakness 08/02/2022   Recurrent falls 08/02/2022   Aortic valve stenosis, nonrheumatic 06/04/2022   Mitral valve stenosis, non-rheumatic 06/04/2022   Coronary artery disease involving native coronary artery of native heart without angina pectoris 06/04/2022   History of squamous cell carcinoma 04/08/2022   Vitamin D deficiency 09/25/2021   Perirectal abscess 06/07/2021   Decreased cardiac ejection fraction 06/07/2021   Weight loss 01/14/2021   Constipation, chronic 10/07/2020   Kidney stone 10/07/2020   Aortic atherosclerosis ([place]) 09/10/2020   Complete heart block ([place]) 06/18/2020   Abnormal TSH 06/11/2020   Second degree atrioventricular block    CHF (congestive heart failure) ([place]) 06/01/2020   CKD (chronic kidney disease), stage III ([place]) 06/01/2020   DNR (do not resuscitate) 06/01/2020   Abnormal findings on diagnostic imaging of lung 04/03/2020   Healthcare maintenance 04/03/2020   UIP  probable dx by hrct  07/07/2019   DOE (dyspnea on exertion) 07/05/2019    Skin lesion of scalp 05/18/2017   COPD exacerbation ([place]) 11/20/2015   Idiopathic choroidal neovascularization of both eyes 07/17/2015   Pseudophakia of both eyes 07/17/2015   Vitelliform lesion of macula 07/17/2015   Vitreomacular adhesion of right eye 07/17/2015   Insomnia 05/22/2015   Cortical age-related cataract of left eye 05/19/2015   Nuclear sclerotic cataract of left eye 05/19/2015   Cataract, mature 04/28/2015   Olecranon bursitis of right elbow 02/03/2015   Biventricular cardiac pacemaker in situ 10/10/2013   Left subclavian artery occlusion 09/19/2013   Aftercare following surgery of the circulatory system, NEC 09/19/2013   Occlusion of right carotid artery 09/19/2013   Second degree AV block 07/09/2013   Encounter for well adult exam with abnormal findings 11/17/2010   BRADYCARDIA 05/11/2009   Dizziness 10/06/2008   Cough 03/27/2008   PNEUMONIA ORGANISM NOS 11/08/2007   Diabetes ([place]) 10/09/2007   SHOULDER PAIN, BILATERAL 08/01/2007   Shoulder joint pain 08/01/2007   BACTEREMIA, 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   Depression 04/09/2007   Allergic rhinitis 04/09/2007   COPD GOLD II / AB component  04/09/2007   Diverticulosis of colon 04/09/2007   Benign prostatic hyperplasia 04/09/2007   History of colonic polyps 04/09/2007   SHINGLES, HX OF 04/09/2007   Hyperlipidemia 03/27/2007   Essential hypertension 03/27/2007   Peripheral vascular disease ([place]) 03/27/2007    Orientation RESPIRATION BLADDER Height & Weight     Self, Time, Situation, Place  O2 Incontinent Weight: 70.9 kg Height:  6\' 1"  (185.4 cm)  BEHAVIORAL SYMPTOMS/MOOD NEUROLOGICAL BOWEL NUTRITION STATUS      Incontinent Diet (Regular)  AMBULATORY STATUS  COMMUNICATION OF NEEDS Skin   Limited Assist Verbally Skin abrasions (gen'l skin abrasions-pad)                       Personal Care Assistance Level of Assistance  Bathing, Feeding, Dressing Bathing Assistance: Limited assistance Feeding  assistance: Limited assistance Dressing Assistance: Limited assistance     Functional Limitations Info  Sight, Hearing, Speech Sight Info: Impaired (eyeglasses) Hearing Info: Impaired Speech Info: Adequate    SPECIAL CARE FACTORS FREQUENCY  PT (By licensed PT), OT (By licensed OT)     PT Frequency:  (5x week) OT Frequency:  (5x week)            Contractures Contractures Info: Not present    Additional Factors Info  Code Status, Allergies Code Status Info:  (Full) Allergies Info:  (ace inhibitors,tape,crestor)           Current Medications (08/25/2022):  This is the current hospital active medication list Current Facility-Administered Medications  Medication Dose Route Frequency Provider Last Rate Last Admin   0.9 %  sodium chloride infusion   Intravenous Continuous Rolm Bookbinder, MD 75 mL/hr at 08/25/22 0229 New Bag at 08/25/22 0229   acetaminophen (TYLENOL) tablet 650 mg  650 mg Oral Q6H PRN Rolm Bookbinder, MD   650 mg at 08/24/22 1537   Or   acetaminophen (TYLENOL) suppository 650 mg  650 mg Rectal Q6H PRN Rolm Bookbinder, MD       albuterol (PROVENTIL) (2.5 MG/3ML) 0.083% nebulizer solution 2.5 mg  2.5 mg Nebulization Q6H PRN Eugenie Filler, MD       chlorhexidine (PERIDEX) 0.12 % solution 15 mL  15 mL Mouth/Throat Once Rolm Bookbinder, MD       Chlorhexidine Gluconate Cloth 2 % PADS 6 each  6 each Topical Daily Rolm Bookbinder, MD   6 each at 08/24/22 [phone]   cholecalciferol (VITAMIN D3) 25 MCG (1000 UNIT) tablet 5,000 Units  5,000 Units Oral Daily Eugenie Filler, MD   5,000 Units at 08/25/22 1024   feeding supplement (ENSURE ENLIVE / ENSURE PLUS) liquid 237 mL  237 mL Oral BID BM Rolm Bookbinder, MD   237 mL at 08/25/22 1256   fluticasone (FLONASE) 50 MCG/ACT nasal spray 2 spray  2 spray Each Nare Daily Eugenie Filler, MD   2 spray at 08/25/22 1023   guaiFENesin (MUCINEX) 12 hr tablet 1,200 mg  1,200 mg Oral BID Eugenie Filler, MD    1,200 mg at 08/25/22 1024   levothyroxine (SYNTHROID) tablet 50 mcg  50 mcg Oral Q0600 Eugenie Filler, MD   50 mcg at 08/25/22 0603   loratadine (CLARITIN) tablet 10 mg  10 mg Oral Daily Eugenie Filler, MD   10 mg at 08/25/22 1023   magnesium oxide (MAG-OX) tablet 400 mg  400 mg Oral QPM Eugenie Filler, MD   400 mg at 08/24/22 1819   ondansetron (ZOFRAN) tablet 4 mg  4 mg Oral Q6H PRN Rolm Bookbinder, MD       Or   ondansetron [facility]) injection 4 mg  4 mg Intravenous Q6H PRN Rolm Bookbinder, MD       Oral care mouth rinse  15 mL Mouth Rinse PRN Rolm Bookbinder, MD       pantoprazole (PROTONIX) EC tablet 40 mg  40 mg Oral Daily Eugenie Filler, MD   40 mg at 08/25/22 1023   piperacillin-tazobactam (ZOSYN) IVPB 3.375 g  3.375 g Intravenous Q8H  Rolm Bookbinder, MD 12.5 mL/hr at 08/25/22 0604 3.375 g at 08/25/22 0604   vitamin B-12 (CYANOCOBALAMIN) tablet 100 mcg  100 mcg Oral Daily Eugenie Filler, MD   100 mcg at 08/25/22 1023     Discharge Medications: Please see discharge summary for a list of discharge medications.  Relevant Imaging Results:  Relevant Lab Results:   Additional Information  (ss#[phone])  Patrica Mendell, Juliann Pulse, RN

## 2022-08-25 NOTE — Progress Notes (Addendum)
Progress Note  1 Day Post-Op  Subjective: No pain. Just had a BM and had dressing changed. Daughter at bedside.   Objective: Vital signs in last 24 hours: Temp:  [97.3 F (36.3 C)-97.6 F (36.4 C)] 97.6 F (36.4 C) (03/28 0559) Pulse Rate:  [77-89] 89 (03/28 0559) Resp:  [14-20] 17 (03/28 0559) BP: (91-133)/(66-92) 106/75 (03/28 0559) SpO2:  [94 %-100 %] 99 % (03/28 0559) FiO2 (%):  [32 %] 32 % (03/27 2004) Weight:  [70.9 kg] 70.9 kg (03/27 1106) Last BM Date : 08/25/22  Intake/Output from previous day: 03/27 0701 - 03/28 0700 In: 1997.5 [P.O.:657; I.V.:1091.7; IV Piggyback:248.7] Out: 650 [Urine:650] Intake/Output this shift: No intake/output data recorded.  PE: General: pleasant, WD, elderly male who is laying in bed in NAD Heart: regular, rate, and rhythm.  Lungs:  Respiratory effort nonlabored Abd: soft, NT, ND GU: 1/4" packing removed. No significant bleeding. No induration or cellultiis  MS: all 4 extremities are symmetrical with no cyanosis, clubbing, or edema. Psych: A&Ox3 with an appropriate affect.    Lab Results:  Recent Labs    08/24/22 0311 08/25/22 0529  WBC 6.4 5.5  HGB 14.8 13.7  HCT 49.7 45.6  PLT 161 149*   BMET Recent Labs    08/24/22 0311 08/25/22 0529  NA 137 137  K 3.6 3.2*  CL 102 102  CO2 23 25  GLUCOSE 103* 126*  BUN 24* 21  CREATININE 1.44* 1.36*  CALCIUM 7.8* 8.0*   PT/INR Recent Labs    08/23/22 0317  LABPROT 15.5*  INR 1.2   CMP     Component Value Date/Time   NA 137 08/25/2022 0529   NA 138 06/30/2022 1120   K 3.2 (L) 08/25/2022 0529   CL 102 08/25/2022 0529   CO2 25 08/25/2022 0529   GLUCOSE 126 (H) 08/25/2022 0529   BUN 21 08/25/2022 0529   BUN 44 (H) 06/30/2022 1120   CREATININE 1.36 (H) 08/25/2022 0529   CREATININE 1.39 (H) 07/05/2019 1538   CALCIUM 8.0 (L) 08/25/2022 0529   PROT 5.9 (L) 08/25/2022 0529   ALBUMIN 3.0 (L) 08/25/2022 0529   AST 25 08/25/2022 0529   ALT 17 08/25/2022 0529    ALKPHOS 148 (H) 08/25/2022 0529   BILITOT 1.9 (H) 08/25/2022 0529   GFRNONAA 49 (L) 08/25/2022 0529   GFRAA 46 (L) 03/07/2019 0322   Lipase     Component Value Date/Time   LIPASE 35 10/03/2020 1730       Studies/Results: No results found.  Anti-infectives: Anti-infectives (From admission, onward)    Start     Dose/Rate Route Frequency Ordered Stop   08/22/22 1230  piperacillin-tazobactam (ZOSYN) IVPB 3.375 g        3.375 g 12.5 mL/hr over 240 Minutes Intravenous Every 8 hours 08/22/22 1143          Assessment/Plan  Bright red blood per rectum Perirectal abscess, recurrent  - s/p flex-sig 3/26 without clear source of bleeding just bloody drainage from fistula tract S/p EUA, drainage of small recurrent perirectal abscess 08/24/22 Dr. Donne Hazel   - packing removed, minimal drainage from fistula tract, no frank blood. No cellulitis - start TID sitz paths and PRN for contamination with stool  - ok for discharge once dispo is established from a general surgery perspective. I will arrange follow up for a post-op check in our office in the next 7-14 days. Then we can arrange colorectal follow up if appropraite. We will sign off. Please  call as needed. Recommend a total of 7 days abx. Ok to to switch to PO.      LOS: 2 days   I reviewed Consultant GI notes, hospitalist notes, last 24 h vitals and pain scores, last 48 h intake and output, last 24 h labs and trends, and last 24 h imaging results.    Jill Alexanders, [facility] Surgery 08/25/2022, 9:16 AM Please see Amion for pager number during day hours 7:00am-4:30pm

## 2022-08-25 NOTE — TOC Initial Note (Signed)
Transition of Care [facility]) - Initial/Assessment Note    Patient Details  Name: Daniel Woodard MRN: [id number] Date of Birth: 12/05/1929  [facility]) CM/SW Contact:    Dessa Phi, RN Phone Number: 08/25/2022, 4:20 PM  Clinical Narrative:  PT recc ST SNF-spoke to dtr Pam had preference for [facility] SNF-faxed out await bed offers.                 Expected Discharge Plan: Skilled Nursing Facility Barriers to Discharge: Continued Medical Work up   Patient Goals and CMS Choice Patient states their goals for this hospitalization and ongoing recovery are::  (short term rehab) CMS Medicare.gov Compare Post Acute Care list provided to:: Patient Represenative (must comment) Choice offered to / list presented to : Adult [place] ownership interest in [facility].provided to:: Adult Children    Expected Discharge Plan and Services   Discharge Planning Services: CM Consult Post Acute Care Choice: [place] Living arrangements for the past 2 months: Single Family Home                                      Prior Living Arrangements/Services Living arrangements for the past 2 months: Single Family Home Lives with:: Adult Children Patient language and need for interpreter reviewed:: Yes        Need for Family Participation in Patient Care: Yes (Comment) Care giver support system in place?: Yes (comment)   Criminal Activity/Legal Involvement Pertinent to Current Situation/Hospitalization: No - Comment as needed  Activities of Daily Living Home Assistive Devices/Equipment: Built-in shower seat, Grab bars around toilet, Grab bars in shower, Raised toilet seat with rails, Walker (specify type) ADL Screening (condition at time of admission) Patient's cognitive ability adequate to safely complete daily activities?: Yes Is the patient deaf or have difficulty hearing?: No Does the patient have difficulty seeing, even when wearing  glasses/contacts?: No Does the patient have difficulty concentrating, remembering, or making decisions?: No Patient able to express need for assistance with ADLs?: Yes Does the patient have difficulty dressing or bathing?: Yes Independently performs ADLs?: No Communication: Independent Dressing (OT): Appropriate for developmental age Grooming: Appropriate for developmental age Feeding: Needs assistance Is this a change from baseline?: Pre-admission baseline Bathing: Needs assistance Is this a change from baseline?: Pre-admission baseline Toileting: Needs assistance Is this a change from baseline?: Pre-admission baseline In/Out Bed: Needs assistance Is this a change from baseline?: Pre-admission baseline Walks in Home: Needs assistance Is this a change from baseline?: Pre-admission baseline Does the patient have difficulty walking or climbing stairs?: Yes Weakness of Legs: Both Weakness of Arms/Hands: None  Permission Sought/Granted                  Emotional Assessment Appearance:: Appears stated age Attitude/Demeanor/Rapport: Gracious Affect (typically observed): Accepting Orientation: : Oriented to Self, Oriented to Place, Oriented to  Time, Oriented to Situation Alcohol / Substance Use: Not Applicable Psych Involvement: No (comment)  Admission diagnosis:  Rectal bleeding [K62.5] Lower GI bleed [K92.2] Patient Active Problem List   Diagnosis Date Noted   Chest congestion 08/24/2022   Protein-calorie malnutrition, severe 08/23/2022   Rectal bleeding [id number]   Chronic systolic CHF (congestive heart failure) ([place]) 08/22/2022   Generalized weakness 08/02/2022   Recurrent falls 08/02/2022   Aortic valve stenosis, nonrheumatic 06/04/2022   Mitral valve stenosis, non-rheumatic 06/04/2022   Coronary artery disease involving native  coronary artery of native heart without angina pectoris 06/04/2022   History of squamous cell carcinoma 04/08/2022   Vitamin D deficiency  09/25/2021   Perirectal abscess 06/07/2021   Decreased cardiac ejection fraction 06/07/2021   Weight loss 01/14/2021   Constipation, chronic 10/07/2020   Kidney stone 10/07/2020   Aortic atherosclerosis ([place]) 09/10/2020   Complete heart block (HCC) 06/18/2020   Abnormal TSH 06/11/2020   Second degree atrioventricular block    CHF (congestive heart failure) ([place]) 06/01/2020   CKD (chronic kidney disease), stage III ([place]) 06/01/2020   DNR (do not resuscitate) 06/01/2020   Abnormal findings on diagnostic imaging of lung 04/03/2020   Healthcare maintenance 04/03/2020   UIP  probable dx by hrct  07/07/2019   DOE (dyspnea on exertion) 07/05/2019   Skin lesion of scalp 05/18/2017   COPD exacerbation ([place]) 11/20/2015   Idiopathic choroidal neovascularization of both eyes 07/17/2015   Pseudophakia of both eyes 07/17/2015   Vitelliform lesion of macula 07/17/2015   Vitreomacular adhesion of right eye 07/17/2015   Insomnia 05/22/2015   Cortical age-related cataract of left eye 05/19/2015   Nuclear sclerotic cataract of left eye 05/19/2015   Cataract, mature 04/28/2015   Olecranon bursitis of right elbow 02/03/2015   Biventricular cardiac pacemaker in situ 10/10/2013   Left subclavian artery occlusion 09/19/2013   Aftercare following surgery of the circulatory system, NEC 09/19/2013   Occlusion of right carotid artery 09/19/2013   Second degree AV block 07/09/2013   Encounter for well adult exam with abnormal findings 11/17/2010   BRADYCARDIA 05/11/2009   Dizziness 10/06/2008   Cough 03/27/2008   PNEUMONIA ORGANISM NOS 11/08/2007   Diabetes ([place]) 10/09/2007   SHOULDER PAIN, BILATERAL 08/01/2007   Shoulder joint pain 08/01/2007   BACTEREMIA, 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   Depression 04/09/2007   Allergic rhinitis 04/09/2007   COPD GOLD II / AB component  04/09/2007   Diverticulosis of colon 04/09/2007   Benign prostatic hyperplasia 04/09/2007   History of colonic polyps  04/09/2007   SHINGLES, HX OF 04/09/2007   Hyperlipidemia 03/27/2007   Essential hypertension 03/27/2007   Peripheral vascular disease ([place]) 03/27/2007   PCP:  Biagio Borg, MD Pharmacy:   [place], [place] - [address] [place] [address] Phone: [phone] Fax: [phone]     Social Determinants of Health (SDOH) Social History: SDOH Screenings   Food Insecurity: No Food Insecurity (08/22/2022)  Housing: Low Risk  (08/22/2022)  Transportation Needs: No Transportation Needs (08/22/2022)  Utilities: Not At Risk (08/22/2022)  Alcohol Screen: Low Risk  (02/25/2022)  Depression (PHQ2-9): High Risk (08/02/2022)  Financial Resource Strain: Low Risk  (02/25/2022)  Physical Activity: Inactive (02/25/2022)  Social Connections: Unknown (02/25/2022)  Stress: No Stress Concern Present (02/25/2022)  Tobacco Use: Medium Risk (08/25/2022)   SDOH Interventions:     Readmission Risk Interventions     No data to display

## 2022-08-25 NOTE — Evaluation (Signed)
Physical Therapy Evaluation Patient Details Name: Daniel Woodard MRN: [id number] DOB: 11/25/1929 Today's Date: 08/25/2022  History of Present Illness  Daniel Woodard is a 89 yo male s/p flex-sig 3/26, S/p EUA, drainage of small recurrent perirectal abscess 08/24/22 by Dr. Donne Hazel. PMH: afib, bacteremia, complete heart block, COPD, diabetes, HTN, PVD, shingles, permanent pacemaker insertion  Clinical Impression  Pt admitted with above diagnosis. Pt from home with 2 daughters alternating to provide 24/7 assist with self care, minimal amb to restroom or couch, assisting with transfers and ~3 falls over the past ~4 weeks. Pt currently needing min A to come to sitting EOB with heavy use of BUE on bedrails to upright trunk into sitting. Pt needing mod A+2 for safety to power up to standing and pivot to [facility], therapist blocking bil knees, pt prefers crouched standing posture, limited standing tolerance needing. Daughter in room reports she was seeking ALF at [facility] in [facility] due to inability to assist pt anymore. Pt needing +2 assist with transfers and unable to take steps away from seated surface on eval. Pt currently with functional limitations due to the deficits listed below (see PT Problem List). Pt will benefit from acute skilled PT to increase their independence and safety with mobility to allow discharge.          Recommendations for follow up therapy are one component of a multi-disciplinary discharge planning process, led by the attending physician.  Recommendations may be updated based on patient status, additional functional criteria and insurance authorization.  Follow Up Recommendations Can patient physically be transported by private vehicle: No     Assistance Recommended at Discharge Frequent or constant Supervision/Assistance  Patient can return home with the following  A lot of help with walking and/or transfers;A lot of help with bathing/dressing/bathroom;Assistance with  cooking/housework;Assist for transportation    Equipment Recommendations None recommended by PT  Recommendations for Other Services       Functional Status Assessment       Precautions / Restrictions Precautions Precautions: Fall Restrictions Weight Bearing Restrictions: No      Mobility  Bed Mobility Overal bed mobility: Needs Assistance Bed Mobility: Supine to Sit  Supine to sit: Min assist  General bed mobility comments: pt pulling on bedrails to upright trunk into sitting, ultimately needing min A with use of bedpad to scoot out to EOB    Transfers Overall transfer level: Needs assistance Equipment used: Rolling walker (2 wheels) Transfers: Sit to/from Stand, Bed to chair/wheelchair/BSC Sit to Stand: Mod assist, +2 safety/equipment  Step pivot transfers: Mod assist, +2 safety/equipment  General transfer comment: mod A+2 for safety, therapist positioned anterior to pt blocking knees, maintains static bil knee flexion and crouched stance with RW, able to take 2-3 steps with bed to [facility] then BSC to bed and bed to recliner but unable to take steps away from seated surfaces    Ambulation/Gait   Stairs     Wheelchair Mobility    Modified Rankin (Stroke Patients Only)       Balance Overall balance assessment: Needs assistance Sitting-balance support: Feet supported Sitting balance-Leahy Scale: Fair  Standing balance support: Reliant on assistive device for balance, During functional activity, Bilateral upper extremity supported Standing balance-Leahy Scale: Poor       Pertinent Vitals/Pain Pain Assessment Pain Assessment: No/denies pain    Home Living Family/patient expects to be discharged to:: Skilled nursing facility Living Arrangements: Children  Additional Comments: Per daughter, she and her sister are currently providing 24/7 assist  to pt in single level home, no stairs, but they are no longer able to care for him.    Prior Function Prior Level of  Function : Needs assist  Mobility Comments: daughter reports pt needing assisting with stand pivot transfers, minimal amb with RW, ~3 falls recently ADLs Comments: daughter reports pt using urinal, total A for showering and A for toileting     Hand Dominance   Dominant Hand: Right    Extremity/Trunk Assessment   Upper Extremity Assessment Upper Extremity Assessment: Defer to OT evaluation    Lower Extremity Assessment Lower Extremity Assessment: Generalized weakness (AROM WFL, strength grossly 3+/5, denies numbness/tingling)    Cervical / Trunk Assessment Cervical / Trunk Assessment: Kyphotic  Communication   Communication: No difficulties  Cognition Arousal/Alertness: Awake/alert Behavior During Therapy: WFL for tasks assessed/performed Overall Cognitive Status: Within Functional Limits for tasks assessed  General Comments: pt joking and pleasant when assisting daughter with PLOF, able to follow commands        General Comments      Exercises     Assessment/Plan    PT Assessment Patient needs continued PT services  PT Problem List Decreased strength;Decreased activity tolerance;Decreased balance;Decreased mobility;Decreased safety awareness;Decreased knowledge of precautions;Cardiopulmonary status limiting activity;Decreased skin integrity       PT Treatment Interventions DME instruction;Gait training;Functional mobility training;Therapeutic activities;Therapeutic exercise;Balance training;Patient/family education    PT Goals (Current goals can be found in the Care Plan section)  Acute Rehab PT Goals Patient Stated Goal: get stronger PT Goal Formulation: With patient/family Time For Goal Achievement: 09/08/22 Potential to Achieve Goals: Good    Frequency Min 2X/week     Co-evaluation               AM-PAC PT "6 Clicks" Mobility  Outcome Measure Help needed turning from your back to your side while in a flat bed without using bedrails?: A Lot Help  needed moving from lying on your back to sitting on the side of a flat bed without using bedrails?: A Lot Help needed moving to and from a bed to a chair (including a wheelchair)?: A Lot Help needed standing up from a chair using your arms (e.g., wheelchair or bedside chair)?: A Lot Help needed to walk in hospital room?: Total Help needed climbing 3-5 steps with a railing? : Total 6 Click Score: 10    End of Session Equipment Utilized During Treatment: Gait belt Activity Tolerance: Patient tolerated treatment well Patient left: in chair;with call bell/phone within reach;with chair alarm set;with family/visitor present Nurse Communication: Mobility status      Time: 1020-1056 PT Time Calculation (min) (ACUTE ONLY): 36 min   Charges:   PT Evaluation $PT Eval Low Complexity: 1 Low PT Treatments $Therapeutic Activity: 8-22 mins         Tori Varie Machamer PT, DPT 08/25/22, 12:07 PM

## 2022-08-25 NOTE — Plan of Care (Signed)
  Problem: Clinical Measurements: Goal: Will remain free from infection Outcome: Progressing Goal: Diagnostic test results will improve Outcome: Progressing   

## 2022-08-25 NOTE — Progress Notes (Signed)
PROGRESS NOTE    Daniel Woodard  [id number] DOB: [birth year] DOA: 08/22/2022 PCP: Biagio Borg, MD    Chief Complaint  Patient presents with   Rectal Bleeding    Brief Narrative:   89 yo male with pmh of allergic rhinitis, atrial fibrillation not on anticoagulation MAC bacteremia, BPH, bradycardia, carotid artery occlusion, colon polyps, complete heart block, permanent pacemaker placement, COPD, depression, type 2 diabetes, colon diverticulosis, hyperlipidemia, hypertension, PVD, history of pneumonia, history of herpes zoster, perirectal abscess in January 2023 requiring an incision and drainage now presents to the ER with bright red bleeding with blood clots.  Denied abdominal pain nausea vomiting constipation or melena.  Denied fever or chills.  He lost about 8 pounds. Admission hemoglobin was 17.5 repeat was 14.6.   CTA -negative for active GI bleed but showed a U-shaped collar fluid density concerning for a perirectal abscess posterior to and abutting the low rectum/anus with an associated rim-enhancing fistula from the left side of the low rectum/anus down to the left skin surface of the intergluteal fold. Other CTA findings included prominent calcific plaque at the ostium of the SMA with probable stenosis, tiny 4-5 mm ulcerated plaque or penetrating ulcer anterior wall abdominal aorta just inferior to the IMA origin, left lower lobe collapse with small to moderate bilateral pleural effusions (left greater than right), subtle nodularity of liver contour suggestive of possible cirrhosis, diffuse body wall edema and aortic atherosclerosis.  Patient seen in consultation by gastroenterology, Dr. Lyndel Safe who recommended flexible sigmoidoscopy for further evaluation which was done 08/23/2022 without clear source of bleeding, bloody drainage noted from fistula tract.  Patient being followed by general surgery and patient underwent EUA with I&D of small recurrent perirectal abscess, 12/24/2022.   Patient also placed empirically on IV antibiotics.   Assessment & Plan:   Principal Problem:   Rectal bleeding Active Problems:   Hyperlipidemia   Essential hypertension   COPD GOLD II / AB component    CKD (chronic kidney disease), stage III (HCC)   Aortic atherosclerosis (HCC)   Perirectal abscess   Coronary artery disease involving native coronary artery of native heart without angina pectoris   Chronic systolic CHF (congestive heart failure) (HCC)   Protein-calorie malnutrition, severe   Chest congestion  #1 concern for perianal fistula with active bleeding/recurrent perirectal abscess -Patient appears to be with some rectal bleeding, CT scan done with U-shaped color fluid density with peripheral rim enhancement posterior to and abutting the lower rectum and anus. -Patient underwent flexible sigmoidoscopy by GI, with inadequate colonic prep, perianal fistula with active bleeding and stool in the rectum was noted. -General surgery following and patient subsequently underwent EUA, drainage of small recurrent perirectal abscess, 08/24/2022 -Hemoglobin currently stable at 13.7 from 17.5 on day of admission. -Continue empiric IV Zosyn. -Patient assessed by general surgery today who removed packing noted minimal drainage from fistula tract, no frank blood noted no cellulitis noted and recommended 3 times daily sitz bath's and as needed for contamination with stool. -General surgery recommended total of 7 days of antibiotics. -Continue IV Zosyn through today and transition to oral Augmentin tomorrow. -Per general surgery.  2.  Hyperlipidemia -Continue to hold statin and resume on discharge.  3.  Chest Congestion -Per daughter patient congested over the past 2 months, noted to have diffuse rhonchorous breath sounds on examination on 08/24/2022. -Rhonchorous breath sounds improved while sitting up in recliner.. -Chest x-ray done on admission negative for any acute infiltrate. -Patient  currently afebrile. -  Normal white count. -Currently empirically on IV Zosyn due to concern for recurrent perirectal abscess. -Continue Mucinex, Claritin, Flonase, incentive spirometry, flutter valve.   -Supportive care.    4.  Hypertension -Blood pressure soft and as such Lasix and metoprolol held. -IV albumin x 1.  5.  CAD -Beta-blocker on hold due to soft blood pressure. -Statin on hold and will resume on discharge.  6.  History of chronic systolic heart failure -Stable. -Not volume overloaded on exam, seems more on the dry side. -Due to soft blood pressure, metoprolol and Lasix held. -Monitor closely with gentle hydration.  7.  CKD stage IIIb -Stable. -Monitor volume status closely.  8.  Hypokalemia -Potassium at 3.2 this morning.  Replete.   -Repeat labs in the AM.    DVT prophylaxis: SCDs Code Status: Full Family Communication: Updated patient, daughter at bedside. Disposition: SNF  Status is: Inpatient Remains inpatient appropriate because: Severity of illness   Consultants:  Gastroenterology: Dr. Lyndel Safe 08/22/2022 General surgery: Dr. Donne Hazel 08/22/2022  Procedures:  Chest x-ray 08/22/2022 CT angiogram abdomen and pelvis 08/22/2022 Flexible sigmoidoscopy per GI: Dr. Lyndel Safe 08/23/2022 EUA, drainage of small recurrent perirectal abscess per general surgery: Dr. Donne Hazel 08/24/2022  Antimicrobials:  IV Zosyn 08/22/2022>>>>>   Subjective: Sitting up in recliner.  Feels well.  Denies any chest pain or shortness of breath.  Denies any further rectal bleeding.  Daughter at bedside.  Patient in agreement for SNF placement.   Objective: Vitals:   08/24/22 1345 08/24/22 1400 08/24/22 2004 08/25/22 0559  BP:  105/73 133/66 106/75  Pulse:  79 86 89  Resp: 17 14 20 17   Temp:  (!) 97.4 F (36.3 C) (!) 97.3 F (36.3 C) 97.6 F (36.4 C)  TempSrc:  Oral Oral   SpO2:  100% 100% 99%  Weight:      Height:        Intake/Output Summary (Last 24 hours) at 08/25/2022  1250 Last data filed at 08/25/2022 0644 Gross per 24 hour  Intake 1919.93 ml  Output 500 ml  Net 1419.93 ml    Filed Weights   08/23/22 1152 08/24/22 1020 08/24/22 1106  Weight: 69 kg 70.9 kg 70.9 kg    Examination:  General exam: NAD.  Dry mucous membranes.  Respiratory system: Lungs clear to auscultation bilaterally.  No wheezes, no crackles, no rhonchi.  Fair air movement.  Speaking in full sentences.    Cardiovascular system: Regular rate rhythm no murmurs rubs or gallops.  No JVD.  No lower extremity edema.  Gastrointestinal system: Abdomen is soft, nontender, nondistended, positive bowel sounds.  No rebound.  No guarding.   Central nervous system: Alert and oriented. No focal neurological deficits. Extremities: Symmetric 5 x 5 power. Skin: No rashes, lesions or ulcers Psychiatry: Judgement and insight appear normal. Mood & affect appropriate.     Data Reviewed: I have personally reviewed following labs and imaging studies  CBC: Recent Labs  Lab 08/22/22 0420 08/22/22 0646 08/22/22 2136 08/23/22 0317 08/23/22 1651 08/24/22 0311 08/25/22 0529  WBC 9.3  --   --  6.8 6.6 6.4 5.5  NEUTROABS 7.7  --   --   --   --   --   --   HGB 17.5*   < > 15.8 14.7 15.6 14.8 13.7  HCT 57.0*   < > 52.3* 49.7 51.3 49.7 45.6  MCV 79.1*  --   --  82.3 81.2 82.1 81.7  PLT 207  --   --  170 188  161 149*   < > = values in this interval not displayed.     Basic Metabolic Panel: Recent Labs  Lab 08/22/22 0420 08/23/22 0317 08/24/22 0311 08/25/22 0529  NA 138 138 137 137  K 3.4* 3.5 3.6 3.2*  CL 95* 103 102 102  CO2 26 23 23 25   GLUCOSE 92 100* 103* 126*  BUN 33* 29* 24* 21  CREATININE 1.65* 1.43* 1.44* 1.36*  CALCIUM 8.4* 7.8* 7.8* 8.0*  MG  --   --   --  2.3     GFR: Estimated Creatinine Clearance: 36.2 mL/min (A) (by C-G formula based on SCr of 1.36 mg/dL (H)).  Liver Function Tests: Recent Labs  Lab 08/23/22 0317 08/24/22 0311 08/25/22 0529  AST 23 27 25   ALT  16 17 17   ALKPHOS 152* 165* 148*  BILITOT 2.3* 2.1* 1.9*  PROT 5.6* 5.9* 5.9*  ALBUMIN 2.6* 2.6* 3.0*     CBG: Recent Labs  Lab 08/24/22 0607 08/24/22 1403 08/25/22 0031 08/25/22 0555 08/25/22 1231  GLUCAP 115* 105* 155* 143* 141*      Recent Results (from the past 240 hour(s))  MRSA Next Gen by PCR, Nasal     Status: None   Collection Time: 08/22/22  8:59 PM   Specimen: Nasal Mucosa; Nasal Swab  Result Value Ref Range Status   MRSA by PCR Next Gen NOT DETECTED NOT DETECTED Final    Comment: (NOTE) The GeneXpert MRSA Assay (FDA approved for NASAL specimens only), is one component of a comprehensive MRSA colonization surveillance program. It is not intended to diagnose MRSA infection nor to guide or monitor treatment for MRSA infections. Test performance is not FDA approved in patients less than 42 years old. Performed at [facility] [address] [place], [place] [address]          Radiology Studies: No results found.      Scheduled Meds:  chlorhexidine  15 mL Mouth/Throat Once   Chlorhexidine Gluconate Cloth  6 each Topical Daily   cholecalciferol  5,000 Units Oral Daily   feeding supplement  237 mL Oral BID BM   fluticasone  2 spray Each Nare Daily   guaiFENesin  1,200 mg Oral BID   levothyroxine  50 mcg Oral Q0600   loratadine  10 mg Oral Daily   magnesium oxide  400 mg Oral QPM   pantoprazole  40 mg Oral Daily   vitamin B-12  100 mcg Oral Daily   Continuous Infusions:  sodium chloride 75 mL/hr at 08/25/22 0229   piperacillin-tazobactam (ZOSYN)  IV 3.375 g (08/25/22 0604)     LOS: 2 days    Time spent: 35 minutes    Irine Seal, MD [facility]   To contact the attending provider between 7A-7P or the covering provider during after hours 7P-7A, please log into the web site [email or web address] and access using universal [place] password for that web site. If you do not have the password, please call the  hospital operator.  08/25/2022, 12:50 PM

## 2022-08-26 DIAGNOSIS — K625 Hemorrhage of anus and rectum: Secondary | ICD-10-CM | POA: Diagnosis not present

## 2022-08-26 DIAGNOSIS — N1832 Chronic kidney disease, stage 3b: Secondary | ICD-10-CM | POA: Diagnosis not present

## 2022-08-26 DIAGNOSIS — J449 Chronic obstructive pulmonary disease, unspecified: Secondary | ICD-10-CM | POA: Diagnosis not present

## 2022-08-26 DIAGNOSIS — I10 Essential (primary) hypertension: Secondary | ICD-10-CM | POA: Diagnosis not present

## 2022-08-26 LAB — BASIC METABOLIC PANEL
Anion gap: 8 (ref 5–15)
BUN: 18 mg/dL (ref 8–23)
CO2: 25 mmol/L (ref 22–32)
Calcium: 8.2 mg/dL — ABNORMAL LOW (ref 8.9–10.3)
Chloride: 106 mmol/L (ref 98–111)
Creatinine, Ser: 1.24 mg/dL (ref 0.61–1.24)
GFR, Estimated: 55 mL/min — ABNORMAL LOW (ref >60)
Glucose, Bld: 117 mg/dL — ABNORMAL HIGH (ref 70–99)
Potassium: 3.6 mmol/L (ref 3.5–5.1)
Sodium: 139 mmol/L (ref 135–145)

## 2022-08-26 LAB — CBC WITH DIFFERENTIAL/PLATELET
Abs Immature Granulocytes: 0.07 10*3/uL (ref 0.00–0.07)
Basophils Absolute: 0.1 10*3/uL (ref 0.0–0.1)
Basophils Relative: 1 %
Eosinophils Absolute: 0 10*3/uL (ref 0.0–0.5)
Eosinophils Relative: 1 %
HCT: 45.3 % (ref 39.0–52.0)
Hemoglobin: 13.4 g/dL (ref 13.0–17.0)
Immature Granulocytes: 1 %
Lymphocytes Relative: 12 %
Lymphs Abs: 0.7 10*3/uL (ref 0.7–4.0)
MCH: 24.5 pg — ABNORMAL LOW (ref 26.0–34.0)
MCHC: 29.6 g/dL — ABNORMAL LOW (ref 30.0–36.0)
MCV: 83 fL (ref 80.0–100.0)
Monocytes Absolute: 0.8 10*3/uL (ref 0.1–1.0)
Monocytes Relative: 13 %
Neutro Abs: 4.3 10*3/uL (ref 1.7–7.7)
Neutrophils Relative %: 72 %
Platelets: 171 10*3/uL (ref 150–400)
RBC: 5.46 MIL/uL (ref 4.22–5.81)
RDW: 23.1 % — ABNORMAL HIGH (ref 11.5–15.5)
WBC: 5.9 10*3/uL (ref 4.0–10.5)
nRBC: 0 % (ref 0.0–0.2)

## 2022-08-26 LAB — GLUCOSE, CAPILLARY
Glucose-Capillary: 126 mg/dL — ABNORMAL HIGH (ref 70–99)
Glucose-Capillary: 144 mg/dL — ABNORMAL HIGH (ref 70–99)

## 2022-08-26 MED ORDER — POTASSIUM CHLORIDE CRYS ER 20 MEQ PO TBCR
40.0000 meq | EXTENDED_RELEASE_TABLET | Freq: Once | ORAL | Status: AC
  Administered 2022-08-26: 40 meq via ORAL
  Filled 2022-08-26: qty 2

## 2022-08-26 MED ORDER — AMOXICILLIN-POT CLAVULANATE 875-125 MG PO TABS
1.0000 | ORAL_TABLET | Freq: Two times a day (BID) | ORAL | Status: AC
  Administered 2022-08-26 – 2022-08-30 (×10): 1 via ORAL
  Filled 2022-08-26 (×10): qty 1

## 2022-08-26 NOTE — Progress Notes (Signed)
PROGRESS NOTE    Daniel Woodard  [id number] DOB: September 18, 1929 DOA: 08/22/2022 PCP: Biagio Borg, MD    Chief Complaint  Patient presents with   Rectal Bleeding    Brief Narrative:   89 yo male with pmh of allergic rhinitis, atrial fibrillation not on anticoagulation MAC bacteremia, BPH, bradycardia, carotid artery occlusion, colon polyps, complete heart block, permanent pacemaker placement, COPD, depression, type 2 diabetes, colon diverticulosis, hyperlipidemia, hypertension, PVD, history of pneumonia, history of herpes zoster, perirectal abscess in January 2023 requiring an incision and drainage now presents to the ER with bright red bleeding with blood clots.  Denied abdominal pain nausea vomiting constipation or melena.  Denied fever or chills.  He lost about 8 pounds. Admission hemoglobin was 17.5 repeat was 14.6.   CTA -negative for active GI bleed but showed a U-shaped collar fluid density concerning for a perirectal abscess posterior to and abutting the low rectum/anus with an associated rim-enhancing fistula from the left side of the low rectum/anus down to the left skin surface of the intergluteal fold. Other CTA findings included prominent calcific plaque at the ostium of the SMA with probable stenosis, tiny 4-5 mm ulcerated plaque or penetrating ulcer anterior wall abdominal aorta just inferior to the IMA origin, left lower lobe collapse with small to moderate bilateral pleural effusions (left greater than right), subtle nodularity of liver contour suggestive of possible cirrhosis, diffuse body wall edema and aortic atherosclerosis.  Patient seen in consultation by gastroenterology, Dr. Lyndel Safe who recommended flexible sigmoidoscopy for further evaluation which was done 08/23/2022 without clear source of bleeding, bloody drainage noted from fistula tract.  Patient being followed by general surgery and patient underwent EUA with I&D of small recurrent perirectal abscess, 12/24/2022.   Patient also placed empirically on IV antibiotics.   Assessment & Plan:   Principal Problem:   Rectal bleeding Active Problems:   Hyperlipidemia   Essential hypertension   COPD GOLD II / AB component    CKD (chronic kidney disease), stage III (HCC)   Aortic atherosclerosis (HCC)   Perirectal abscess   Coronary artery disease involving native coronary artery of native heart without angina pectoris   Chronic systolic CHF (congestive heart failure) (HCC)   Protein-calorie malnutrition, severe   Chest congestion  #1 concern for perianal fistula with active bleeding/recurrent perirectal abscess -Patient appears to be with some rectal bleeding, CT scan done with U-shaped color fluid density with peripheral rim enhancement posterior to and abutting the lower rectum and anus. -Patient underwent flexible sigmoidoscopy by GI, with inadequate colonic prep, perianal fistula with active bleeding and stool in the rectum was noted. -General surgery following and patient subsequently underwent EUA, drainage of small recurrent perirectal abscess, 08/24/2022 -Hemoglobin currently stable at 13.7 from 17.5 on day of admission. -Continue empiric IV Zosyn. -Patient assessed by general surgery today who removed packing noted minimal drainage from fistula tract, no frank blood noted no cellulitis noted and recommended 3 times daily sitz bath's and as needed for contamination with stool. -General surgery recommended total of 7 days of antibiotics. -Currently on IV Zosyn, transition to oral Augmentin to complete a 7-day course of antibiotic treatment.   -Outpatient follow-up with general surgery.  -Per general surgery.  2.  Hyperlipidemia -Statin on hold, resume on discharge.   3.  Chest Congestion -Per daughter patient congested over the past 2 months, noted to have diffuse rhonchorous breath sounds on examination on 08/24/2022. -Rhonchorous breath sounds improved while sitting up in recliner.. -Chest  x-ray done on admission negative for any acute infiltrate. -Patient currently afebrile. -Normal white count. -Currently empirically on IV Zosyn due to concern for recurrent perirectal abscess. -Continue Mucinex, Claritin, Flonase, incentive spirometry, flutter valve.   -Supportive care.    4.  Hypertension -Blood pressure soft and as such Lasix and metoprolol on hold.   -Status post IV albumin x 1.  5.  CAD -Blood pressure soft and as such beta-blocker on hold.   -Continue to hold statin and resume on discharge.   6.  History of chronic systolic heart failure -Stable. -Not volume overloaded on exam, seems more on the dry side. -Due to soft blood pressure, metoprolol and Lasix held. -Monitor closely with gentle hydration. -Saline lock IV fluids.  7.  CKD stage IIIb -Stable. -Monitor volume status closely.  8.  Hypokalemia -Repleted, potassium at 3.6 today.   -Repeat labs in the AM.    DVT prophylaxis: SCDs Code Status: Full Family Communication: Updated patient, no family at bedside.  Disposition: Medically stable as of 08/26/2022.  SNF when bed available  Status is: Inpatient Remains inpatient appropriate because: Severity of illness   Consultants:  Gastroenterology: Dr. Lyndel Safe 08/22/2022 General surgery: Dr. Donne Hazel 08/22/2022  Procedures:  Chest x-ray 08/22/2022 CT angiogram abdomen and pelvis 08/22/2022 Flexible sigmoidoscopy per GI: Dr. Lyndel Safe 08/23/2022 EUA, drainage of small recurrent perirectal abscess per general surgery: Dr. Donne Hazel 08/24/2022  Antimicrobials:  IV Zosyn 08/22/2022>>>>> 08/26/2022 Augmentin 08/26/2022>>>   Subjective: Sitting up in bed.  Denies any chest pain or shortness of breath.  Denies any rectal bleeding.  Overall feels well.    Objective: Vitals:   08/25/22 1304 08/25/22 2006 08/25/22 2152 08/26/22 0625  BP: 115/80 (!) 143/81 109/83 102/76  Pulse: 96 96 (!) 101 (!) 104  Resp:  (!) 21 20   Temp:  98.5 F (36.9 C) (!) 97.5 F (36.4  C) 97.8 F (36.6 C)  TempSrc:  Oral Oral Axillary  SpO2: 99% 100% 98% 99%  Weight:      Height:        Intake/Output Summary (Last 24 hours) at 08/26/2022 1323 Last data filed at 08/26/2022 0606 Gross per 24 hour  Intake 1075.64 ml  Output 650 ml  Net 425.64 ml    Filed Weights   08/23/22 1152 08/24/22 1020 08/24/22 1106  Weight: 69 kg 70.9 kg 70.9 kg    Examination:  General exam: NAD Respiratory system: CTAB.  No wheezes, no crackles, no rhonchi.  Fair air movement.  Speaking in full sentences.     Cardiovascular system: RRR no murmurs rubs or gallops.  No JVD.  No lower extremity edema.  Gastrointestinal system: Abdomen is soft, nontender, nondistended, positive bowel sounds.  No rebound.  No guarding.   Central nervous system: Alert and oriented. No focal neurological deficits. Extremities: Symmetric 5 x 5 power. Skin: No rashes, lesions or ulcers Psychiatry: Judgement and insight appear normal. Mood & affect appropriate.     Data Reviewed: I have personally reviewed following labs and imaging studies  CBC: Recent Labs  Lab 08/22/22 0420 08/22/22 0646 08/23/22 0317 08/23/22 1651 08/24/22 0311 08/25/22 0529 08/26/22 0447  WBC 9.3  --  6.8 6.6 6.4 5.5 5.9  NEUTROABS 7.7  --   --   --   --   --  4.3  HGB 17.5*   < > 14.7 15.6 14.8 13.7 13.4  HCT 57.0*   < > 49.7 51.3 49.7 45.6 45.3  MCV 79.1*  --  82.3 81.2  82.1 81.7 83.0  PLT 207  --  170 188 161 149* 171   < > = values in this interval not displayed.     Basic Metabolic Panel: Recent Labs  Lab 08/22/22 0420 08/23/22 0317 08/24/22 0311 08/25/22 0529 08/26/22 0447  NA 138 138 137 137 139  K 3.4* 3.5 3.6 3.2* 3.6  CL 95* 103 102 102 106  CO2 26 23 23 25 25   GLUCOSE 92 100* 103* 126* 117*  BUN 33* 29* 24* 21 18  CREATININE 1.65* 1.43* 1.44* 1.36* 1.24  CALCIUM 8.4* 7.8* 7.8* 8.0* 8.2*  MG  --   --   --  2.3  --      GFR: Estimated Creatinine Clearance: 39.7 mL/min (by C-G formula based on SCr  of 1.24 mg/dL).  Liver Function Tests: Recent Labs  Lab 08/23/22 0317 08/24/22 0311 08/25/22 0529  AST 23 27 25   ALT 16 17 17   ALKPHOS 152* 165* 148*  BILITOT 2.3* 2.1* 1.9*  PROT 5.6* 5.9* 5.9*  ALBUMIN 2.6* 2.6* 3.0*     CBG: Recent Labs  Lab 08/25/22 0031 08/25/22 0555 08/25/22 1231 08/26/22 0115 08/26/22 1145  GLUCAP 155* 143* 141* 126* 144*      Recent Results (from the past 240 hour(s))  MRSA Next Gen by PCR, Nasal     Status: None   Collection Time: 08/22/22  8:59 PM   Specimen: Nasal Mucosa; Nasal Swab  Result Value Ref Range Status   MRSA by PCR Next Gen NOT DETECTED NOT DETECTED Final    Comment: (NOTE) The GeneXpert MRSA Assay (FDA approved for NASAL specimens only), is one component of a comprehensive MRSA colonization surveillance program. It is not intended to diagnose MRSA infection nor to guide or monitor treatment for MRSA infections. Test performance is not FDA approved in patients less than 49 years old. Performed at [facility], [place] [address], [place], [place] [address]          Radiology Studies: No results found.      Scheduled Meds:  amoxicillin-clavulanate  1 tablet Oral Q12H   chlorhexidine  15 mL Mouth/Throat Once   Chlorhexidine Gluconate Cloth  6 each Topical Daily   cholecalciferol  5,000 Units Oral Daily   feeding supplement  237 mL Oral BID BM   fluticasone  2 spray Each Nare Daily   guaiFENesin  1,200 mg Oral BID   levothyroxine  50 mcg Oral Q0600   loratadine  10 mg Oral Daily   magnesium oxide  400 mg Oral QPM   pantoprazole  40 mg Oral Daily   vitamin B-12  100 mcg Oral Daily   Continuous Infusions:  sodium chloride 50 mL/hr at 08/25/22 2343     LOS: 3 days    Time spent: 35 minutes    Irine Seal, MD [facility]   To contact the attending provider between 7A-7P or the covering provider during after hours 7P-7A, please log into the web site [email or web address] and access  using universal [place] password for that web site. If you do not have the password, please call the hospital operator.  08/26/2022, 1:23 PM

## 2022-08-26 NOTE — Progress Notes (Signed)
Physical Therapy Treatment Patient Details Name: Daniel Woodard MRN: [id number] DOB: 1929/09/03 Today's Date: 08/26/2022   History of Present Illness Daniel Woodard is a 89 yo male admitted 08/22/22 with rectal bleed s/p flex-sig 3/26, S/p EUA, drainage of small recurrent perirectal abscess 08/24/22 by Dr. Donne Hazel. PMH: afib, bacteremia, complete heart block, COPD, diabetes, HTN, PVD, shingles, permanent pacemaker insertion    PT Comments    Pt with improved transfers and able to take a few steps today.  Pt requiring min-mod A for transfers and side steps at EOB.   However, pt fatigued easily and requesting to return to bed multiple times throughout session.  Continue plan of care.    Recommendations for follow up therapy are one component of a multi-disciplinary discharge planning process, led by the attending physician.  Recommendations may be updated based on patient status, additional functional criteria and insurance authorization.  Follow Up Recommendations  Can patient physically be transported by private vehicle: No    Assistance Recommended at Discharge Frequent or constant Supervision/Assistance  Patient can return home with the following A lot of help with walking and/or transfers;A lot of help with bathing/dressing/bathroom;Assistance with cooking/housework;Assist for transportation   Equipment Recommendations  None recommended by PT    Recommendations for Other Services       Precautions / Restrictions Precautions Precautions: Fall Restrictions Weight Bearing Restrictions: No     Mobility  Bed Mobility Overal bed mobility: Needs Assistance Bed Mobility: Supine to Sit, Sit to Supine     Supine to sit: Mod assist Sit to supine: Min assist   General bed mobility comments: Cues to slide legs to EOB with mod A to lift trunk.  Light min A to guide trunk to suipne    Transfers Overall transfer level: Needs assistance Equipment used: Rolling walker (2  wheels) Transfers: Sit to/from Stand Sit to Stand: Mod assist           General transfer comment: Had assist of 2 for safety but pt able to stand with mod A from elevated bed with cues for hand placement    Ambulation/Gait Ambulation/Gait assistance: Min assist Gait Distance (Feet): 4 Feet Assistive device: Rolling walker (2 wheels) Gait Pattern/deviations: Step-to pattern, Decreased stride length Gait velocity: decreased     General Gait Details: marching in place and a few steps at EOB with cues for RW and upright posture; easilty fatigued   Stairs             Wheelchair Mobility    Modified Rankin (Stroke Patients Only)       Balance Overall balance assessment: Needs assistance Sitting-balance support: Feet supported, No upper extremity supported Sitting balance-Leahy Scale: Fair     Standing balance support: Reliant on assistive device for balance, During functional activity, Bilateral upper extremity supported Standing balance-Leahy Scale: Poor Standing balance comment: Min guard/min A with RW                            Cognition Arousal/Alertness: Awake/alert Behavior During Therapy: WFL for tasks assessed/performed Overall Cognitive Status: Within Functional Limits for tasks assessed                                 General Comments: Pt pleasant and agrees to PT but reports fatigued        Exercises General Exercises - Lower Extremity Ankle Circles/Pumps: AROM, Both, 10  reps, Supine Short Arc Quad: AROM, Both, 10 reps, Supine Long Arc Quad: AROM, Seated, 10 reps, Both Heel Slides: AROM, Both, 10 reps, Supine Hip ABduction/ADduction: AROM, Both, 10 reps, Supine, Strengthening (pillow squeeze)    General Comments General comments (skin integrity, edema, etc.): Daughter present; Pt reports fatigue and wanting to lay back down -states "let's get this over with so I can lay back down)      Pertinent Vitals/Pain Pain  Assessment Pain Assessment: No/denies pain    Home Living                          Prior Function            PT Goals (current goals can now be found in the care plan section) Progress towards PT goals: Progressing toward goals    Frequency    Min 2X/week      PT Plan Current plan remains appropriate    Co-evaluation              AM-PAC PT "6 Clicks" Mobility   Outcome Measure  Help needed turning from your back to your side while in a flat bed without using bedrails?: A Lot Help needed moving from lying on your back to sitting on the side of a flat bed without using bedrails?: A Lot Help needed moving to and from a bed to a chair (including a wheelchair)?: A Lot Help needed standing up from a chair using your arms (e.g., wheelchair or bedside chair)?: A Lot Help needed to walk in hospital room?: Total Help needed climbing 3-5 steps with a railing? : Total 6 Click Score: 10    End of Session Equipment Utilized During Treatment: Gait belt Activity Tolerance: Patient tolerated treatment well Patient left: with call bell/phone within reach;with family/visitor present;in bed;with bed alarm set Nurse Communication: Mobility status PT Visit Diagnosis: Other abnormalities of gait and mobility (R26.89)     Time: [id number] PT Time Calculation (min) (ACUTE ONLY): 16 min  Charges:  $Therapeutic Activity: 8-22 mins                     Abran Richard, PT Acute Rehab [facility] [phone]    Karlton Lemon 08/26/2022, 3:39 PM

## 2022-08-27 DIAGNOSIS — I10 Essential (primary) hypertension: Secondary | ICD-10-CM | POA: Diagnosis not present

## 2022-08-27 DIAGNOSIS — J449 Chronic obstructive pulmonary disease, unspecified: Secondary | ICD-10-CM | POA: Diagnosis not present

## 2022-08-27 DIAGNOSIS — K625 Hemorrhage of anus and rectum: Secondary | ICD-10-CM | POA: Diagnosis not present

## 2022-08-27 DIAGNOSIS — N1832 Chronic kidney disease, stage 3b: Secondary | ICD-10-CM | POA: Diagnosis not present

## 2022-08-27 LAB — BASIC METABOLIC PANEL
Anion gap: 10 (ref 5–15)
BUN: 20 mg/dL (ref 8–23)
CO2: 23 mmol/L (ref 22–32)
Calcium: 8.6 mg/dL — ABNORMAL LOW (ref 8.9–10.3)
Chloride: 106 mmol/L (ref 98–111)
Creatinine, Ser: 1.31 mg/dL — ABNORMAL HIGH (ref 0.61–1.24)
GFR, Estimated: 52 mL/min — ABNORMAL LOW (ref >60)
Glucose, Bld: 123 mg/dL — ABNORMAL HIGH (ref 70–99)
Potassium: 4.6 mmol/L (ref 3.5–5.1)
Sodium: 139 mmol/L (ref 135–145)

## 2022-08-27 LAB — GLUCOSE, CAPILLARY
Glucose-Capillary: 115 mg/dL — ABNORMAL HIGH (ref 70–99)
Glucose-Capillary: 143 mg/dL — ABNORMAL HIGH (ref 70–99)
Glucose-Capillary: 128 mg/dL — ABNORMAL HIGH (ref 70–99)

## 2022-08-27 LAB — CBC
HCT: 47.7 % (ref 39.0–52.0)
Hemoglobin: 14.5 g/dL (ref 13.0–17.0)
MCH: 24.5 pg — ABNORMAL LOW (ref 26.0–34.0)
MCHC: 30.4 g/dL (ref 30.0–36.0)
MCV: 80.4 fL (ref 80.0–100.0)
Platelets: 173 10*3/uL (ref 150–400)
RBC: 5.93 MIL/uL — ABNORMAL HIGH (ref 4.22–5.81)
RDW: 23.5 % — ABNORMAL HIGH (ref 11.5–15.5)
WBC: 5.9 10*3/uL (ref 4.0–10.5)
nRBC: 0 % (ref 0.0–0.2)

## 2022-08-27 NOTE — Progress Notes (Signed)
PROGRESS NOTE    Daniel Woodard  [id number] DOB: 1930-02-28 DOA: 08/22/2022 PCP: Biagio Borg, MD    Chief Complaint  Patient presents with   Rectal Bleeding    Brief Narrative:   89 yo male with pmh of allergic rhinitis, atrial fibrillation not on anticoagulation MAC bacteremia, BPH, bradycardia, carotid artery occlusion, colon polyps, complete heart block, permanent pacemaker placement, COPD, depression, type 2 diabetes, colon diverticulosis, hyperlipidemia, hypertension, PVD, history of pneumonia, history of herpes zoster, perirectal abscess in January 2023 requiring an incision and drainage now presents to the ER with bright red bleeding with blood clots.  Denied abdominal pain nausea vomiting constipation or melena.  Denied fever or chills.  He lost about 8 pounds. Admission hemoglobin was 17.5 repeat was 14.6.   CTA -negative for active GI bleed but showed a U-shaped collar fluid density concerning for a perirectal abscess posterior to and abutting the low rectum/anus with an associated rim-enhancing fistula from the left side of the low rectum/anus down to the left skin surface of the intergluteal fold. Other CTA findings included prominent calcific plaque at the ostium of the SMA with probable stenosis, tiny 4-5 mm ulcerated plaque or penetrating ulcer anterior wall abdominal aorta just inferior to the IMA origin, left lower lobe collapse with small to moderate bilateral pleural effusions (left greater than right), subtle nodularity of liver contour suggestive of possible cirrhosis, diffuse body wall edema and aortic atherosclerosis.  Patient seen in consultation by gastroenterology, Dr. Lyndel Safe who recommended flexible sigmoidoscopy for further evaluation which was done 08/23/2022 without clear source of bleeding, bloody drainage noted from fistula tract.  Patient being followed by general surgery and patient underwent EUA with I&D of small recurrent perirectal abscess, 12/24/2022.   Patient also placed empirically on IV antibiotics.   Assessment & Plan:   Principal Problem:   Rectal bleeding Active Problems:   Hyperlipidemia   Essential hypertension   COPD GOLD II / AB component    CKD (chronic kidney disease), stage III (HCC)   Aortic atherosclerosis (HCC)   Perirectal abscess   Coronary artery disease involving native coronary artery of native heart without angina pectoris   Chronic systolic CHF (congestive heart failure) (HCC)   Protein-calorie malnutrition, severe   Chest congestion  #1 concern for perianal fistula with active bleeding/recurrent perirectal abscess -Patient appears to be with some rectal bleeding, CT scan done with U-shaped color fluid density with peripheral rim enhancement posterior to and abutting the lower rectum and anus. -Patient underwent flexible sigmoidoscopy by GI, with inadequate colonic prep, perianal fistula with active bleeding and stool in the rectum was noted. -General surgery following and patient subsequently underwent EUA, drainage of small recurrent perirectal abscess, 08/24/2022 -Hemoglobin currently stable at 14.5 from 17.5 on day of admission. -Was on IV Zosyn and has been transition to Augmentin. -Patient assessed by general surgery on 08/26/2022, who removed packing noted minimal drainage from fistula tract, no frank blood noted no cellulitis noted and recommended 3 times daily sitz bath's and as needed for contamination with stool. -General surgery recommended total of 7 days of antibiotics. -Outpatient follow-up with general surgery.  -Per general surgery.  2.  Hyperlipidemia -Continue to hold statin and likely resume on discharge.   3.  Chest Congestion -Per daughter patient congested over the past 2 months, noted to have diffuse rhonchorous breath sounds on examination on 08/24/2022. -Rhonchorous breath sounds improved while sitting up in recliner.. -Chest x-ray done on admission negative for any acute  infiltrate. -Patient currently afebrile. -Normal white count. -Was on IV Zosyn and transition to Augmentin for recurrent perirectal abscess.  -Continue Mucinex, Claritin, Flonase, incentive spirometry, flutter valve.   -Supportive care.    4.  Hypertension -Blood pressure soft and as such Lasix and metoprolol on hold.   -Status post IV albumin x 1.  5.  CAD -Blood pressure soft in the size beta-blocker on hold.   -Statin on hold and can resume on discharge.   6.  History of chronic systolic heart failure -Stable. -Not volume overloaded on exam, seems more on the dry side. -Due to soft blood pressure, metoprolol and Lasix held. -Monitor closely with gentle hydration. -Saline lock IV fluids.  7.  CKD stage IIIb -Stable. -Monitor volume status closely.  8.  Hypokalemia -Repleted.  -Repeat labs in the AM.    DVT prophylaxis: SCDs Code Status: Full Family Communication: Updated patient, no family at bedside.  Disposition: Medically stable as of 08/26/2022.  SNF when bed available  Status is: Inpatient Remains inpatient appropriate because: Severity of illness   Consultants:  Gastroenterology: Dr. Lyndel Safe 08/22/2022 General surgery: Dr. Donne Hazel 08/22/2022  Procedures:  Chest x-ray 08/22/2022 CT angiogram abdomen and pelvis 08/22/2022 Flexible sigmoidoscopy per GI: Dr. Lyndel Safe 08/23/2022 EUA, drainage of small recurrent perirectal abscess per general surgery: Dr. Donne Hazel 08/24/2022  Antimicrobials:  IV Zosyn 08/22/2022>>>>> 08/26/2022 Augmentin 08/26/2022>>>   Subjective: Sitting up in bed.  Daughter at bedside.  Daughter states patient just got a breathing treatment which helped him a lot.  No chest pain.  Notices worsening shortness of breath.  No abdominal pain.  Denies any rectal bleeding.  Per nurse tech no bleeding noted with bowel movement.    Objective: Vitals:   08/26/22 0625 08/26/22 1700 08/27/22 0027 08/27/22 0634  BP: 102/76  108/85 110/70  Pulse: (!) 104  (!)  110 63  Resp:   20 18  Temp: 97.8 F (36.6 C)  97.9 F (36.6 C) (!) 97.3 F (36.3 C)  TempSrc: Axillary  Oral Oral  SpO2: 99% 99% 92% 93%  Weight:      Height:        Intake/Output Summary (Last 24 hours) at 08/27/2022 1216 Last data filed at 08/27/2022 0840 Gross per 24 hour  Intake 356 ml  Output 200 ml  Net 156 ml    Filed Weights   08/23/22 1152 08/24/22 1020 08/24/22 1106  Weight: 69 kg 70.9 kg 70.9 kg    Examination:  General exam: NAD Respiratory system: Lungs clear to auscultation bilaterally.  No wheezes, no crackles, no rhonchi.  Fair air movement.  Speaking in full sentences.      Cardiovascular system: Regular rate rhythm no murmurs rubs or gallops.  No JVD.  No lower extremity edema.  Gastrointestinal system: Abdomen is soft, nontender, nondistended, positive bowel sounds.  No rebound.  No guarding.   Central nervous system: Alert and oriented. No focal neurological deficits. Extremities: Symmetric 5 x 5 power. Skin: No rashes, lesions or ulcers Psychiatry: Judgement and insight appear normal. Mood & affect appropriate.     Data Reviewed: I have personally reviewed following labs and imaging studies  CBC: Recent Labs  Lab 08/22/22 0420 08/22/22 0646 08/23/22 1651 08/24/22 0311 08/25/22 0529 08/26/22 0447 08/27/22 0528  WBC 9.3   < > 6.6 6.4 5.5 5.9 5.9  NEUTROABS 7.7  --   --   --   --  4.3  --   HGB 17.5*   < > 15.6 14.8  13.7 13.4 14.5  HCT 57.0*   < > 51.3 49.7 45.6 45.3 47.7  MCV 79.1*   < > 81.2 82.1 81.7 83.0 80.4  PLT 207   < > 188 161 149* 171 173   < > = values in this interval not displayed.     Basic Metabolic Panel: Recent Labs  Lab 08/23/22 0317 08/24/22 0311 08/25/22 0529 08/26/22 0447 08/27/22 0528  NA 138 137 137 139 139  K 3.5 3.6 3.2* 3.6 4.6  CL 103 102 102 106 106  CO2 23 23 25 25 23   GLUCOSE 100* 103* 126* 117* 123*  BUN 29* 24* 21 18 20   CREATININE 1.43* 1.44* 1.36* 1.24 1.31*  CALCIUM 7.8* 7.8* 8.0* 8.2* 8.6*   MG  --   --  2.3  --   --      GFR: Estimated Creatinine Clearance: 37.6 mL/min (A) (by C-G formula based on SCr of 1.31 mg/dL (H)).  Liver Function Tests: Recent Labs  Lab 08/23/22 0317 08/24/22 0311 08/25/22 0529  AST 23 27 25   ALT 16 17 17   ALKPHOS 152* 165* 148*  BILITOT 2.3* 2.1* 1.9*  PROT 5.6* 5.9* 5.9*  ALBUMIN 2.6* 2.6* 3.0*     CBG: Recent Labs  Lab 08/25/22 0555 08/25/22 1231 08/26/22 0115 08/26/22 1145 08/27/22 1201  GLUCAP 143* 141* 126* 144* 115*      Recent Results (from the past 240 hour(s))  MRSA Next Gen by PCR, Nasal     Status: None   Collection Time: 08/22/22  8:59 PM   Specimen: Nasal Mucosa; Nasal Swab  Result Value Ref Range Status   MRSA by PCR Next Gen NOT DETECTED NOT DETECTED Final    Comment: (NOTE) The GeneXpert MRSA Assay (FDA approved for NASAL specimens only), is one component of a comprehensive MRSA colonization surveillance program. It is not intended to diagnose MRSA infection nor to guide or monitor treatment for MRSA infections. Test performance is not FDA approved in patients less than 54 years old. Performed at [facility], [place] [address] [place], [place] [address]          Radiology Studies: No results found.      Scheduled Meds:  amoxicillin-clavulanate  1 tablet Oral Q12H   chlorhexidine  15 mL Mouth/Throat Once   Chlorhexidine Gluconate Cloth  6 each Topical Daily   cholecalciferol  5,000 Units Oral Daily   feeding supplement  237 mL Oral BID BM   fluticasone  2 spray Each Nare Daily   guaiFENesin  1,200 mg Oral BID   levothyroxine  50 mcg Oral Q0600   loratadine  10 mg Oral Daily   magnesium oxide  400 mg Oral QPM   pantoprazole  40 mg Oral Daily   vitamin B-12  100 mcg Oral Daily   Continuous Infusions:     LOS: 4 days    Time spent: 35 minutes    Irine Seal, MD [facility]   To contact the attending provider between 7A-7P or the covering  provider during after hours 7P-7A, please log into the web site [email or web address] and access using universal [place] password for that web site. If you do not have the password, please call the hospital operator.  08/27/2022, 12:16 PM

## 2022-08-28 DIAGNOSIS — J449 Chronic obstructive pulmonary disease, unspecified: Secondary | ICD-10-CM | POA: Diagnosis not present

## 2022-08-28 DIAGNOSIS — I10 Essential (primary) hypertension: Secondary | ICD-10-CM | POA: Diagnosis not present

## 2022-08-28 DIAGNOSIS — N1832 Chronic kidney disease, stage 3b: Secondary | ICD-10-CM | POA: Diagnosis not present

## 2022-08-28 DIAGNOSIS — K625 Hemorrhage of anus and rectum: Secondary | ICD-10-CM | POA: Diagnosis not present

## 2022-08-28 LAB — GLUCOSE, CAPILLARY
Glucose-Capillary: 109 mg/dL — ABNORMAL HIGH (ref 70–99)
Glucose-Capillary: 244 mg/dL — ABNORMAL HIGH (ref 70–99)
Glucose-Capillary: 99 mg/dL (ref 70–99)
Glucose-Capillary: 138 mg/dL — ABNORMAL HIGH (ref 70–99)

## 2022-08-28 MED ORDER — FUROSEMIDE 20 MG PO TABS
20.0000 mg | ORAL_TABLET | Freq: Every day | ORAL | Status: DC
  Administered 2022-08-28 – 2022-08-29 (×2): 20 mg via ORAL
  Filled 2022-08-28 (×2): qty 1

## 2022-08-28 MED ORDER — LOPERAMIDE HCL 2 MG PO CAPS
2.0000 mg | ORAL_CAPSULE | ORAL | Status: DC | PRN
  Administered 2022-08-28 – 2022-08-30 (×3): 2 mg via ORAL
  Filled 2022-08-28 (×3): qty 1

## 2022-08-28 MED ORDER — GLYCOPYRROLATE 0.2 MG/ML IJ SOLN
0.1000 mg | Freq: Once | INTRAMUSCULAR | Status: AC
  Administered 2022-08-28: 0.1 mg via INTRAVENOUS
  Filled 2022-08-28: qty 0.5

## 2022-08-28 MED ORDER — LOPERAMIDE HCL 2 MG PO CAPS
2.0000 mg | ORAL_CAPSULE | Freq: Once | ORAL | Status: AC
  Administered 2022-08-28: 2 mg via ORAL
  Filled 2022-08-28: qty 1

## 2022-08-28 NOTE — Progress Notes (Signed)
OT Cancellation Note  Patient Details Name: Daniel Woodard MRN: [id number] DOB: 03/27/1930   Cancelled Treatment:    Reason Eval/Treat Not Completed: Patient declined, no reason specified Patient declined no reason specified. Family in room reporting patient didn't eat lunch. OT to continue to follow and check back as schedule will allow.   Rennie Plowman, MS Acute Rehabilitation Department Office# [phone]  08/28/2022, 2:31 PM

## 2022-08-28 NOTE — Progress Notes (Signed)
PROGRESS NOTE    Daniel Woodard  [id number] DOB: May 20, 1930 DOA: 08/22/2022 PCP: Biagio Borg, MD    Chief Complaint  Patient presents with   Rectal Bleeding    Brief Narrative:   89 yo male with pmh of allergic rhinitis, atrial fibrillation not on anticoagulation MAC bacteremia, BPH, bradycardia, carotid artery occlusion, colon polyps, complete heart block, permanent pacemaker placement, COPD, depression, type 2 diabetes, colon diverticulosis, hyperlipidemia, hypertension, PVD, history of pneumonia, history of herpes zoster, perirectal abscess in January 2023 requiring an incision and drainage now presents to the ER with bright red bleeding with blood clots.  Denied abdominal pain nausea vomiting constipation or melena.  Denied fever or chills.  He lost about 8 pounds. Admission hemoglobin was 17.5 repeat was 14.6.   CTA -negative for active GI bleed but showed a U-shaped collar fluid density concerning for a perirectal abscess posterior to and abutting the low rectum/anus with an associated rim-enhancing fistula from the left side of the low rectum/anus down to the left skin surface of the intergluteal fold. Other CTA findings included prominent calcific plaque at the ostium of the SMA with probable stenosis, tiny 4-5 mm ulcerated plaque or penetrating ulcer anterior wall abdominal aorta just inferior to the IMA origin, left lower lobe collapse with small to moderate bilateral pleural effusions (left greater than right), subtle nodularity of liver contour suggestive of possible cirrhosis, diffuse body wall edema and aortic atherosclerosis.  Patient seen in consultation by gastroenterology, Dr. Lyndel Safe who recommended flexible sigmoidoscopy for further evaluation which was done 08/23/2022 without clear source of bleeding, bloody drainage noted from fistula tract.  Patient being followed by general surgery and patient underwent EUA with I&D of small recurrent perirectal abscess, 12/24/2022.   Patient also placed empirically on IV antibiotics.   Assessment & Plan:   Principal Problem:   Rectal bleeding Active Problems:   Hyperlipidemia   Essential hypertension   COPD GOLD II / AB component    CKD (chronic kidney disease), stage III (HCC)   Aortic atherosclerosis (HCC)   Perirectal abscess   Coronary artery disease involving native coronary artery of native heart without angina pectoris   Chronic systolic CHF (congestive heart failure) (HCC)   Protein-calorie malnutrition, severe   Chest congestion  #1 concern for perianal fistula with active bleeding/recurrent perirectal abscess -Patient appears to be with some rectal bleeding, CT scan done with U-shaped color fluid density with peripheral rim enhancement posterior to and abutting the lower rectum and anus. -Patient underwent flexible sigmoidoscopy by GI, with inadequate colonic prep, perianal fistula with active bleeding and stool in the rectum was noted. -General surgery following and patient subsequently underwent EUA, drainage of small recurrent perirectal abscess, 08/24/2022 -Hemoglobin currently stable at 14.5 from 17.5 on day of admission. -Was on IV Zosyn and has been transitioned to Augmentin. -Patient assessed by general surgery on 08/26/2022, who removed packing noted minimal drainage from fistula tract, no frank blood noted no cellulitis noted and recommended 3 times daily sitz bath's and as needed for contamination with stool. -General surgery recommended total of 7 days of antibiotics. -Outpatient follow-up with general surgery.  -Per general surgery.  2.  Hyperlipidemia -Statin on hold, resume on discharge.   3.  Chest Congestion -Per daughter patient congested over the past 2 months, noted to have diffuse rhonchorous breath sounds on examination on 08/24/2022. -Rhonchorous breath sounds improved while sitting up in recliner.. -Chest x-ray done on admission negative for any acute infiltrate. -Patient  currently  afebrile. -Normal white count. -Was on IV Zosyn and transitioned to Augmentin for recurrent perirectal abscess.  -Continue Mucinex, Claritin, Flonase, incentive spirometry, flutter valve.   -IV Robinul x 1 dose. -Supportive care.    4.  Hypertension -Blood pressure soft and as such Lasix and metoprolol on hold.   -Status post IV albumin x 1. -Resume home regimen Lasix tomorrow monitor blood pressure.  5.  CAD -Blood pressure soft in the size beta-blocker on hold.   -Statin on hold and can resume on discharge.   6.  History of chronic systolic heart failure -Stable. -Not volume overloaded on exam, seems more on the dry side. -Due to soft blood pressure, metoprolol and Lasix held. -Monitor closely with gentle hydration. -Resume home dose Lasix tomorrow.  7.  CKD stage IIIb -Stable. -Monitor volume status closely.  8.  Hypokalemia -Repleted.  -Repeat labs in the AM.    DVT prophylaxis: SCDs Code Status: DNR Family Communication: Updated patient, updated daughter at bedside.   Disposition: Medically stable as of 08/26/2022.  SNF when bed available  Status is: Inpatient Remains inpatient appropriate because: Severity of illness   Consultants:  Gastroenterology: Dr. Lyndel Safe 08/22/2022 General surgery: Dr. Donne Hazel 08/22/2022  Procedures:  Chest x-ray 08/22/2022 CT angiogram abdomen and pelvis 08/22/2022 Flexible sigmoidoscopy per GI: Dr. Lyndel Safe 08/23/2022 EUA, drainage of small recurrent perirectal abscess per general surgery: Dr. Donne Hazel 08/24/2022  Antimicrobials:  IV Zosyn 08/22/2022>>>>> 08/26/2022 Augmentin 08/26/2022>>>   Subjective: Sleeping but easily arousable.  Daughter at bedside.  Denies any chest pain.  Denies any significant shortness of breath.  No abdominal pain.  On the bedpan trying to have a bowel movement.  No rectal bleeding.   Objective: Vitals:   08/27/22 2039 08/28/22 0045 08/28/22 0453 08/28/22 1100  BP: 103/82  105/76 137/77  Pulse:  (!) 106  (!) 108   Resp:   14   Temp:   (!) 97.5 F (36.4 C)   TempSrc:   Axillary   SpO2: 97% 96% 96%   Weight:      Height:        Intake/Output Summary (Last 24 hours) at 08/28/2022 1117 Last data filed at 08/28/2022 1000 Gross per 24 hour  Intake 553 ml  Output 200 ml  Net 353 ml    Filed Weights   08/23/22 1152 08/24/22 1020 08/24/22 1106  Weight: 69 kg 70.9 kg 70.9 kg    Examination:  General exam: NAD Respiratory system: Some scattered coarse breath sounds.  Congestion noted.  No wheezing.  No crackles.  Fair air movement.  Speaking in full sentences.   Cardiovascular system: RRR no murmurs rubs or gallops.  No JVD.  No lower extremity edema.  Gastrointestinal system: Abdomen is soft, nontender, nondistended, positive bowel sounds.  No rebound.  No guarding.  Central nervous system: Alert and oriented. No focal neurological deficits. Extremities: Symmetric 5 x 5 power. Skin: No rashes, lesions or ulcers Psychiatry: Judgement and insight appear normal. Mood & affect appropriate.     Data Reviewed: I have personally reviewed following labs and imaging studies  CBC: Recent Labs  Lab 08/22/22 0420 08/22/22 0646 08/23/22 1651 08/24/22 0311 08/25/22 0529 08/26/22 0447 08/27/22 0528  WBC 9.3   < > 6.6 6.4 5.5 5.9 5.9  NEUTROABS 7.7  --   --   --   --  4.3  --   HGB 17.5*   < > 15.6 14.8 13.7 13.4 14.5  HCT 57.0*   < > 51.3 49.7  45.6 45.3 47.7  MCV 79.1*   < > 81.2 82.1 81.7 83.0 80.4  PLT 207   < > 188 161 149* 171 173   < > = values in this interval not displayed.     Basic Metabolic Panel: Recent Labs  Lab 08/23/22 0317 08/24/22 0311 08/25/22 0529 08/26/22 0447 08/27/22 0528  NA 138 137 137 139 139  K 3.5 3.6 3.2* 3.6 4.6  CL 103 102 102 106 106  CO2 23 23 25 25 23   GLUCOSE 100* 103* 126* 117* 123*  BUN 29* 24* 21 18 20   CREATININE 1.43* 1.44* 1.36* 1.24 1.31*  CALCIUM 7.8* 7.8* 8.0* 8.2* 8.6*  MG  --   --  2.3  --   --       GFR: Estimated Creatinine Clearance: 37.6 mL/min (A) (by C-G formula based on SCr of 1.31 mg/dL (H)).  Liver Function Tests: Recent Labs  Lab 08/23/22 0317 08/24/22 0311 08/25/22 0529  AST 23 27 25   ALT 16 17 17   ALKPHOS 152* 165* 148*  BILITOT 2.3* 2.1* 1.9*  PROT 5.6* 5.9* 5.9*  ALBUMIN 2.6* 2.6* 3.0*     CBG: Recent Labs  Lab 08/26/22 1145 08/27/22 1201 08/27/22 1640 08/27/22 2057 08/28/22 0806  GLUCAP 144* 115* 143* 128* 109*      Recent Results (from the past 240 hour(s))  MRSA Next Gen by PCR, Nasal     Status: None   Collection Time: 08/22/22  8:59 PM   Specimen: Nasal Mucosa; Nasal Swab  Result Value Ref Range Status   MRSA by PCR Next Gen NOT DETECTED NOT DETECTED Final    Comment: (NOTE) The GeneXpert MRSA Assay (FDA approved for NASAL specimens only), is one component of a comprehensive MRSA colonization surveillance program. It is not intended to diagnose MRSA infection nor to guide or monitor treatment for MRSA infections. Test performance is not FDA approved in patients less than 82 years old. Performed at [facility], [place] [address] [place], [place] [address]          Radiology Studies: No results found.      Scheduled Meds:  amoxicillin-clavulanate  1 tablet Oral Q12H   chlorhexidine  15 mL Mouth/Throat Once   Chlorhexidine Gluconate Cloth  6 each Topical Daily   cholecalciferol  5,000 Units Oral Daily   feeding supplement  237 mL Oral BID BM   fluticasone  2 spray Each Nare Daily   guaiFENesin  1,200 mg Oral BID   levothyroxine  50 mcg Oral Q0600   loratadine  10 mg Oral Daily   magnesium oxide  400 mg Oral QPM   pantoprazole  40 mg Oral Daily   vitamin B-12  100 mcg Oral Daily   Continuous Infusions:     LOS: 5 days    Time spent: 35 minutes    Irine Seal, MD [facility]   To contact the attending provider between 7A-7P or the covering provider during after hours 7P-7A,  please log into the web site [email or web address] and access using universal [place] password for that web site. If you do not have the password, please call the hospital operator.  08/28/2022, 11:17 AM

## 2022-08-29 ENCOUNTER — Inpatient Hospital Stay (HOSPITAL_COMMUNITY): Payer: Medicare Other

## 2022-08-29 DIAGNOSIS — N1832 Chronic kidney disease, stage 3b: Secondary | ICD-10-CM | POA: Diagnosis not present

## 2022-08-29 DIAGNOSIS — R0989 Other specified symptoms and signs involving the circulatory and respiratory systems: Secondary | ICD-10-CM | POA: Diagnosis not present

## 2022-08-29 DIAGNOSIS — I5023 Acute on chronic systolic (congestive) heart failure: Secondary | ICD-10-CM

## 2022-08-29 DIAGNOSIS — K625 Hemorrhage of anus and rectum: Secondary | ICD-10-CM | POA: Diagnosis not present

## 2022-08-29 LAB — GLUCOSE, CAPILLARY
Glucose-Capillary: 119 mg/dL — ABNORMAL HIGH (ref 70–99)
Glucose-Capillary: 142 mg/dL — ABNORMAL HIGH (ref 70–99)
Glucose-Capillary: 126 mg/dL — ABNORMAL HIGH (ref 70–99)
Glucose-Capillary: 121 mg/dL — ABNORMAL HIGH (ref 70–99)

## 2022-08-29 MED ORDER — FUROSEMIDE 10 MG/ML IJ SOLN
20.0000 mg | Freq: Once | INTRAMUSCULAR | Status: AC
  Administered 2022-08-29: 20 mg via INTRAVENOUS
  Filled 2022-08-29: qty 2

## 2022-08-29 MED ORDER — IPRATROPIUM-ALBUTEROL 0.5-2.5 (3) MG/3ML IN SOLN
RESPIRATORY_TRACT | Status: AC
  Filled 2022-08-29: qty 3

## 2022-08-29 MED ORDER — LOPERAMIDE HCL 2 MG PO CAPS: 2.0000 mg | ORAL_CAPSULE | Freq: Once | ORAL | Status: DC

## 2022-08-29 MED ORDER — ALBUTEROL SULFATE (2.5 MG/3ML) 0.083% IN NEBU
2.5000 mg | INHALATION_SOLUTION | RESPIRATORY_TRACT | Status: DC
  Administered 2022-08-29: 2.5 mg via RESPIRATORY_TRACT
  Filled 2022-08-29: qty 3

## 2022-08-29 MED ORDER — METOPROLOL SUCCINATE ER 25 MG PO TB24
12.5000 mg | ORAL_TABLET | Freq: Every day | ORAL | Status: DC
  Administered 2022-08-29 – 2022-09-03 (×5): 12.5 mg via ORAL
  Filled 2022-08-29 (×6): qty 1

## 2022-08-29 MED ORDER — ALBUMIN HUMAN 25 % IV SOLN
25.0000 g | Freq: Once | INTRAVENOUS | Status: AC
  Administered 2022-08-29: 25 g via INTRAVENOUS
  Filled 2022-08-29: qty 100

## 2022-08-29 MED ORDER — IPRATROPIUM-ALBUTEROL 0.5-2.5 (3) MG/3ML IN SOLN
3.0000 mL | Freq: Once | RESPIRATORY_TRACT | Status: AC | PRN
  Administered 2022-08-29: 3 mL via RESPIRATORY_TRACT

## 2022-08-29 MED ORDER — MORPHINE SULFATE (PF) 2 MG/ML IV SOLN
1.0000 mg | Freq: Once | INTRAVENOUS | Status: AC
  Administered 2022-08-29: 1 mg via INTRAVENOUS
  Filled 2022-08-29: qty 1

## 2022-08-29 MED ORDER — LEVALBUTEROL HCL 0.63 MG/3ML IN NEBU
0.6300 mg | INHALATION_SOLUTION | Freq: Three times a day (TID) | RESPIRATORY_TRACT | Status: DC
  Administered 2022-08-29 – 2022-09-03 (×16): 0.63 mg via RESPIRATORY_TRACT
  Filled 2022-08-29 (×18): qty 3

## 2022-08-29 MED ORDER — SODIUM CHLORIDE 3 % IN NEBU
4.0000 mL | INHALATION_SOLUTION | Freq: Two times a day (BID) | RESPIRATORY_TRACT | Status: DC
  Administered 2022-08-29: 4 mL via RESPIRATORY_TRACT
  Filled 2022-08-29 (×2): qty 4

## 2022-08-29 MED ORDER — FUROSEMIDE 10 MG/ML IJ SOLN
20.0000 mg | Freq: Two times a day (BID) | INTRAMUSCULAR | Status: DC
  Administered 2022-08-29 – 2022-09-01 (×7): 20 mg via INTRAVENOUS
  Filled 2022-08-29 (×7): qty 2

## 2022-08-29 NOTE — Progress Notes (Signed)
Occupational Therapy Treatment Patient Details Name: Daniel Woodard MRN: [id number] DOB: 05-Nov-1929 Today's Date: 08/29/2022   History of present illness Daniel Woodard is a 89 yo male admitted 08/22/22 with rectal bleed s/p flex-sig 3/26, S/p EUA, drainage of small recurrent perirectal abscess 08/24/22 by Dr. Donne Hazel. PMH: afib, bacteremia, complete heart block, COPD, diabetes, HTN, PVD, shingles, permanent pacemaker insertion   OT comments  Pt is requiring more overall assistance with ADLs and functional mobility as evidenced by an decreased score on 6-Clicks AM-PAC measure of occupational Performance with previous score of 15/24, and current score of 13/24. Pt did however show fair sitting balance at EOB and willingness to stand to [employer].  Pt declined overall ADLs as he wished to focus on functional mobility and "get it over with" in terms of returning to supine, but able to show BUE AROM WFL and good functional use of Ues with activities.  Patient remains limited by unwillingness to take PO means recently,  generalized weakness and decreased activity tolerance along with deficits noted below. Pt continues to demonstrate fair to good rehab potential and would benefit from continued skilled OT to increase safety and independence with ADLs and functional transfers to allow pt to return home safely and reduce caregiver burden and fall risk.     Recommendations for follow up therapy are one component of a multi-disciplinary discharge planning process, led by the attending physician.  Recommendations may be updated based on patient status, additional functional criteria and insurance authorization.    Assistance Recommended at Discharge Frequent or constant Supervision/Assistance  Patient can return home with the following  A lot of help with walking and/or transfers;Direct supervision/assist for medications management;Assist for transportation;Help with stairs or ramp for entrance;A lot of  help with bathing/dressing/bathroom;Assistance with cooking/housework   Equipment Recommendations       Recommendations for Other Services      Precautions / Restrictions Precautions Precautions: Fall Restrictions Weight Bearing Restrictions: No       Mobility Bed Mobility Overal bed mobility: Needs Assistance Bed Mobility: Supine to Sit, Sit to Supine     Supine to sit: Mod assist, HOB elevated Sit to supine: Mod assist        Transfers                         Balance Overall balance assessment: Needs assistance Sitting-balance support: Feet supported, No upper extremity supported Sitting balance-Leahy Scale: Fair     Standing balance support: Reliant on assistive device for balance, During functional activity, Bilateral upper extremity supported Standing balance-Leahy Scale: Poor                             ADL either performed or assessed with clinical judgement   ADL Overall ADL's : Needs assistance/impaired   Eating/Feeding Details (indicate cue type and reason): Per daughter, pt has been refusing food. Grooming: Sitting Grooming Details (indicate cue type and reason): Pt came to EOB sitting but declined grooming.             Lower Body Dressing: Total assistance;Bed level Lower Body Dressing Details (indicate cue type and reason): To don socks. Pt cued to kick with minimal movement Toilet Transfer: Moderate assistance;Cueing for safety;Cueing for sequencing;Maximal assistance Toilet Transfer Details (indicate cue type and reason): Pt stood from EOB to [place] first time from low EOB with Max As of 1. 2nd time with Moderate  assist from raised EOB. Pt did 3rd stand from stedy raised flaps with Min As.  Pt with uncontrolled descent to EOB with need of Max As on 1st sit down but much better with cues on final descent to EOB with Min As to control. Toileting- Clothing Manipulation and Hygiene: Total assistance Toileting - Clothing  Manipulation Details (indicate cue type and reason): Foley     Functional mobility during ADLs: Maximal assistance;Moderate assistance;Cueing for safety General ADL Comments: Stedy used for standing today.    Extremity/Trunk Assessment Upper Extremity Assessment Upper Extremity Assessment: Generalized weakness   Lower Extremity Assessment Lower Extremity Assessment: Defer to PT evaluation   Cervical / Trunk Assessment Cervical / Trunk Assessment: Kyphotic    Vision Baseline Vision/History: 1 Wears glasses Patient Visual Report: No change from baseline     Perception     Praxis      Cognition Arousal/Alertness: Lethargic Behavior During Therapy: WFL for tasks assessed/performed Overall Cognitive Status: Within Functional Limits for tasks assessed                                 General Comments: Pt pleasant and agrees to OT with encouragement. Pt's preferred classic country western music was played to help motivate pt and pt enjoyed naming the singers.        Exercises      Shoulder Instructions       General Comments      Pertinent Vitals/ Pain       Pain Assessment Pain Assessment: No/denies pain  Home Living                                          Prior Functioning/Environment              Frequency  Min 2X/week        Progress Toward Goals  OT Goals(current goals can now be found in the care plan section)  Progress towards OT goals: Not progressing toward goals - comment  Acute Rehab OT Goals Patient Stated Goal: Per daugher: For pt to try and for pt to eat. OT Goal Formulation: With family Time For Goal Achievement: 09/08/22 Potential to Achieve Goals: [place] Discharge plan remains appropriate    Co-evaluation                 AM-PAC OT "6 Clicks" Daily Activity     Outcome Measure   Help from another person eating meals?: A Little Help from another person taking care of personal  grooming?: A Little Help from another person toileting, which includes using toliet, bedpan, or urinal?: Total Help from another person bathing (including washing, rinsing, drying)?: A Lot Help from another person to put on and taking off regular upper body clothing?: A Little Help from another person to put on and taking off regular lower body clothing?: Total 6 Click Score: 13    End of Session Equipment Utilized During Treatment: Gait belt;Other (comment);Oxygen  OT Visit Diagnosis: Unsteadiness on feet (R26.81);Other abnormalities of gait and mobility (R26.89);Repeated falls (R29.6);Muscle weakness (generalized) (M62.81)   Activity Tolerance Patient limited by fatigue   Patient Left in bed;with call bell/phone within reach;with bed alarm set;with nursing/sitter in room;with family/visitor present   Nurse Communication Other (comment) (RN observing pt standing with Stedy)  TimeAE:[id number] OT Time Calculation (min): 23 min  Charges: OT General Charges $OT Visit: 1 Visit OT Treatments $Therapeutic Activity: 23-37 mins  [place], OT Acute Rehab Services Office: [phone] 08/29/2022  Julien Girt 08/29/2022, 12:37 PM

## 2022-08-29 NOTE — Plan of Care (Signed)
  Problem: Education: Goal: Knowledge of General Education information will improve Description: Including pain rating scale, medication(s)/side effects and non-pharmacologic comfort measures Outcome: Progressing   Problem: Clinical Measurements: Goal: Will remain free from infection Outcome: Progressing Goal: Respiratory complications will improve Outcome: Progressing Goal: Cardiovascular complication will be avoided Outcome: Progressing   Problem: Activity: Goal: Risk for activity intolerance will decrease Outcome: Progressing   Problem: Nutrition: Goal: Adequate nutrition will be maintained Outcome: Progressing   Problem: Pain Managment: Goal: General experience of comfort will improve Outcome: Progressing   Problem: Safety: Goal: Ability to remain free from injury will improve Outcome: Progressing   

## 2022-08-29 NOTE — Progress Notes (Signed)
PT continues to demonstrate hands on understanding of flutter device- NPC at this time.

## 2022-08-29 NOTE — Progress Notes (Signed)
PT demonstrated hands on understanding of Flutter device- NPC at this time. 

## 2022-08-29 NOTE — Progress Notes (Signed)
PROGRESS NOTE    Daniel Woodard  [id number] DOB: 02-01-1930 DOA: 08/22/2022 PCP: Biagio Borg, MD    Chief Complaint  Patient presents with   Rectal Bleeding    Brief Narrative:   89 yo male with pmh of allergic rhinitis, atrial fibrillation not on anticoagulation MAC bacteremia, BPH, bradycardia, carotid artery occlusion, colon polyps, complete heart block, permanent pacemaker placement, COPD, depression, type 2 diabetes, colon diverticulosis, hyperlipidemia, hypertension, PVD, history of pneumonia, history of herpes zoster, perirectal abscess in January 2023 requiring an incision and drainage now presents to the ER with bright red bleeding with blood clots.  Denied abdominal pain nausea vomiting constipation or melena.  Denied fever or chills.  He lost about 8 pounds. Admission hemoglobin was 17.5 repeat was 14.6.   CTA -negative for active GI bleed but showed a U-shaped collar fluid density concerning for a perirectal abscess posterior to and abutting the low rectum/anus with an associated rim-enhancing fistula from the left side of the low rectum/anus down to the left skin surface of the intergluteal fold. Other CTA findings included prominent calcific plaque at the ostium of the SMA with probable stenosis, tiny 4-5 mm ulcerated plaque or penetrating ulcer anterior wall abdominal aorta just inferior to the IMA origin, left lower lobe collapse with small to moderate bilateral pleural effusions (left greater than right), subtle nodularity of liver contour suggestive of possible cirrhosis, diffuse body wall edema and aortic atherosclerosis.  Patient seen in consultation by gastroenterology, Dr. Lyndel Safe who recommended flexible sigmoidoscopy for further evaluation which was done 08/23/2022 without clear source of bleeding, bloody drainage noted from fistula tract.  Patient being followed by general surgery and patient underwent EUA with I&D of small recurrent perirectal abscess, 12/24/2022.   Patient also placed empirically on IV antibiotics.   Assessment & Plan:   Principal Problem:   Rectal bleeding Active Problems:   Hyperlipidemia   Essential hypertension   COPD GOLD II / AB component    CKD (chronic kidney disease), stage III   Aortic atherosclerosis   Perirectal abscess   Coronary artery disease involving native coronary artery of native heart without angina pectoris   Chronic systolic CHF (congestive heart failure)   Protein-calorie malnutrition, severe   Chest congestion   Acute on chronic systolic CHF (congestive heart failure)  #1 concern for perianal fistula with active bleeding/recurrent perirectal abscess -Patient appears to be with some rectal bleeding, CT scan done with U-shaped color fluid density with peripheral rim enhancement posterior to and abutting the lower rectum and anus. -Patient underwent flexible sigmoidoscopy by GI, with inadequate colonic prep, perianal fistula with active bleeding and stool in the rectum was noted. -General surgery following and patient subsequently underwent EUA, drainage of small recurrent perirectal abscess, 08/24/2022 -Hemoglobin currently stable at 14.5 from 17.5 on day of admission. -Was on IV Zosyn and has been transitioned to Augmentin. -Patient assessed by general surgery on 08/26/2022, who removed packing noted minimal drainage from fistula tract, no frank blood noted no cellulitis noted and recommended 3 times daily sitz bath's and as needed for contamination with stool. -General surgery recommended total of 7 days of antibiotics. -Outpatient follow-up with general surgery.  -Per general surgery.  2.  Hyperlipidemia -Statin on hold, resume on discharge.   3.  Chest Congestion -Per daughter patient congested over the past 2 months, noted to have diffuse rhonchorous breath sounds on examination on 08/24/2022. -Rhonchorous breath sounds improved while sitting up in recliner.. -Chest x-ray done on admission negative  for any acute infiltrate. -Patient currently afebrile. -Normal white count. -Was on IV Zosyn and transitioned to Augmentin for recurrent perirectal abscess.  -Continue Mucinex, Claritin, Flonase, incentive spirometry, flutter valve.   -IV Robinul x 1 dose given.. -Patient noted with increased O2 requirements and a stat chest x-ray done today concerning for volume overload. -Placed on IV Lasix. -Supportive care.    4.  Hypertension -Blood pressure soft and as such Lasix and metoprolol held.  -Status post IV albumin x 1. -Blood pressure improved, resume home regimen metoprolol.   -Place on diuretics.  5.  CAD -Blood pressure soft and as such patient beta-blocker held.   -Resume home regimen beta-blocker.   -Hold statin.   -Place on IV diuretics due to concern for volume overload.    6.  Acute on chronic systolic heart failure. -Stable. -Patient with increased need for O2, with hypoxia and noted to have required 6 L O2 overnight.   -Patient sounds congested on examination.   -Check a BNP.   -Repeat chest x-ray concerning for volume overload.   -Lasix 20 mg IV every 12 hours.   -Strict I's and O's, daily weights.   -Follow.  7.  CKD stage IIIb -Stable. -Monitor volume status closely.  8.  Hypokalemia -Repleted.  -Repeat labs in the AM.    DVT prophylaxis: SCDs Code Status: DNR Family Communication: Updated patient, updated daughter at bedside.   Disposition: SNF when medically stable with improvement with respiratory status.   Status is: Inpatient Remains inpatient appropriate because: Severity of illness   Consultants:  Gastroenterology: Dr. Lyndel Safe 08/22/2022 General surgery: Dr. Donne Hazel 08/22/2022  Procedures:  Chest x-ray 08/22/2022, 08/29/2022 CT angiogram abdomen and pelvis 08/22/2022 Flexible sigmoidoscopy per GI: Dr. Lyndel Safe 08/23/2022 EUA, drainage of small recurrent perirectal abscess per general surgery: Dr. Donne Hazel 08/24/2022  Antimicrobials:  IV Zosyn  08/22/2022>>>>> 08/26/2022 Augmentin 08/26/2022>>>   Subjective: Sleeping but easily arousable.  Denies any chest pain or significant shortness of breath.  Daughter at bedside.  Noted to have been placed on increased O2.  No rectal bleeding.    Objective: Vitals:   08/29/22 0811 08/29/22 1201 08/29/22 1227 08/29/22 1550  BP: (!) 123/96 128/71  114/88  Pulse: (!) 109 (!) 104 (!) 105 (!) 108  Resp: 15 19  (!) 22  Temp: (!) 89.3 F (31.8 C) (!) 97.2 F (36.2 C)  (!) 97.2 F (36.2 C)  TempSrc:    Axillary  SpO2: 97% 99% 98% 98%  Weight:      Height:        Intake/Output Summary (Last 24 hours) at 08/29/2022 1841 Last data filed at 08/29/2022 1839 Gross per 24 hour  Intake 460 ml  Output 410 ml  Net 50 ml   Filed Weights   08/23/22 1152 08/24/22 1020 08/24/22 1106  Weight: 69 kg 70.9 kg 70.9 kg    Examination:  General exam: NAD Respiratory system: Coarse breath sounds diffusely.  Congestion noted.  No wheezing.  No crackles.  Fair air movement.  Speaking in full sentences.   Cardiovascular system: Regular rate rhythm no murmurs rubs or gallops.  No JVD.  No lower extremity edema.  Gastrointestinal system: Abdomen is soft, nontender, nondistended, positive bowel sounds.  No rebound.  No guarding.  Central nervous system: Alert and oriented. No focal neurological deficits. Extremities: Symmetric 5 x 5 power. Skin: No rashes, lesions or ulcers Psychiatry: Judgement and insight appear normal. Mood & affect appropriate.     Data Reviewed: I have personally  reviewed following labs and imaging studies  CBC: Recent Labs  Lab 08/23/22 1651 08/24/22 0311 08/25/22 0529 08/26/22 0447 08/27/22 0528  WBC 6.6 6.4 5.5 5.9 5.9  NEUTROABS  --   --   --  4.3  --   HGB 15.6 14.8 13.7 13.4 14.5  HCT 51.3 49.7 45.6 45.3 47.7  MCV 81.2 82.1 81.7 83.0 80.4  PLT 188 161 149* 171 [id number]    Basic Metabolic Panel: Recent Labs  Lab 08/23/22 0317 08/24/22 0311 08/25/22 0529 08/26/22 0447  08/27/22 0528  NA 138 137 137 139 139  K 3.5 3.6 3.2* 3.6 4.6  CL 103 102 102 106 106  CO2 23 23 25 25 23   GLUCOSE 100* 103* 126* 117* 123*  BUN 29* 24* 21 18 20   CREATININE 1.43* 1.44* 1.36* 1.24 1.31*  CALCIUM 7.8* 7.8* 8.0* 8.2* 8.6*  MG  --   --  2.3  --   --     GFR: Estimated Creatinine Clearance: 37.6 mL/min (A) (by C-G formula based on SCr of 1.31 mg/dL (H)).  Liver Function Tests: Recent Labs  Lab 08/23/22 0317 08/24/22 0311 08/25/22 0529  AST 23 27 25   ALT 16 17 17   ALKPHOS 152* 165* 148*  BILITOT 2.3* 2.1* 1.9*  PROT 5.6* 5.9* 5.9*  ALBUMIN 2.6* 2.6* 3.0*    CBG: Recent Labs  Lab 08/28/22 1749 08/28/22 2114 08/29/22 0719 08/29/22 1156 08/29/22 1640  GLUCAP 99 138* 119* 142* 126*     Recent Results (from the past 240 hour(s))  MRSA Next Gen by PCR, Nasal     Status: None   Collection Time: 08/22/22  8:59 PM   Specimen: Nasal Mucosa; Nasal Swab  Result Value Ref Range Status   MRSA by PCR Next Gen NOT DETECTED NOT DETECTED Final    Comment: (NOTE) The GeneXpert MRSA Assay (FDA approved for NASAL specimens only), is one component of a comprehensive MRSA colonization surveillance program. It is not intended to diagnose MRSA infection nor to guide or monitor treatment for MRSA infections. Test performance is not FDA approved in patients less than 35 years old. Performed at [facility], [place] [address], [place], [place] [address]          Radiology Studies: DG CHEST PORT 1 VIEW  Result Date: 08/29/2022 CLINICAL DATA:  Shortness of breath EXAM: PORTABLE CHEST 1 VIEW COMPARISON:  Portable exam 1219 hours compared to 08/22/2022 FINDINGS: LEFT subclavian pacemaker with leads projecting over RIGHT atrium, RIGHT ventricle, and coronary sinus. Enlargement of cardiac silhouette with pulmonary vascular congestion. Atherosclerotic calcification aorta. Bibasilar effusions and atelectasis, slightly increased. Hazy interstitial infiltrates  favoring pulmonary edema, increased. No pneumothorax or acute osseous findings. IMPRESSION: Increased pulmonary infiltrates question pulmonary edema. Bibasilar effusions and atelectasis, increased. Aortic Atherosclerosis (ICD10-I70.0). Electronically Signed   By: Lavonia Dana M.D.   On: 08/29/2022 12:30        Scheduled Meds:  amoxicillin-clavulanate  1 tablet Oral Q12H   chlorhexidine  15 mL Mouth/Throat Once   Chlorhexidine Gluconate Cloth  6 each Topical Daily   cholecalciferol  5,000 Units Oral Daily   feeding supplement  237 mL Oral BID BM   fluticasone  2 spray Each Nare Daily   furosemide  20 mg Intravenous Q12H   guaiFENesin  1,200 mg Oral BID   levalbuterol  0.63 mg Nebulization TID   levothyroxine  50 mcg Oral Q0600   loratadine  10 mg Oral Daily   magnesium oxide  400  mg Oral QPM   metoprolol succinate  12.5 mg Oral Daily   pantoprazole  40 mg Oral Daily   sodium chloride HYPERTONIC  4 mL Nebulization BID   vitamin B-12  100 mcg Oral Daily   Continuous Infusions:     LOS: 6 days    Time spent: 35 minutes    Irine Seal, MD [facility]   To contact the attending provider between 7A-7P or the covering provider during after hours 7P-7A, please log into the web site [email or web address] and access using universal [place] password for that web site. If you do not have the password, please call the hospital operator.  08/29/2022, 6:41 PM

## 2022-08-30 DIAGNOSIS — I5023 Acute on chronic systolic (congestive) heart failure: Secondary | ICD-10-CM | POA: Diagnosis not present

## 2022-08-30 DIAGNOSIS — N1832 Chronic kidney disease, stage 3b: Secondary | ICD-10-CM | POA: Diagnosis not present

## 2022-08-30 DIAGNOSIS — R0989 Other specified symptoms and signs involving the circulatory and respiratory systems: Secondary | ICD-10-CM | POA: Diagnosis not present

## 2022-08-30 DIAGNOSIS — K625 Hemorrhage of anus and rectum: Secondary | ICD-10-CM | POA: Diagnosis not present

## 2022-08-30 LAB — BASIC METABOLIC PANEL
Anion gap: 10 (ref 5–15)
BUN: 27 mg/dL — ABNORMAL HIGH (ref 8–23)
CO2: 25 mmol/L (ref 22–32)
Calcium: 8.7 mg/dL — ABNORMAL LOW (ref 8.9–10.3)
Chloride: 106 mmol/L (ref 98–111)
Creatinine, Ser: 1.53 mg/dL — ABNORMAL HIGH (ref 0.61–1.24)
GFR, Estimated: 43 mL/min — ABNORMAL LOW (ref >60)
Glucose, Bld: 91 mg/dL (ref 70–99)
Potassium: 3.8 mmol/L (ref 3.5–5.1)
Sodium: 141 mmol/L (ref 135–145)

## 2022-08-30 LAB — CBC WITH DIFFERENTIAL/PLATELET
Abs Immature Granulocytes: 0.05 10*3/uL (ref 0.00–0.07)
Basophils Absolute: 0.1 10*3/uL (ref 0.0–0.1)
Basophils Relative: 1 %
Eosinophils Absolute: 0.1 10*3/uL (ref 0.0–0.5)
Eosinophils Relative: 1 %
HCT: 48.3 % (ref 39.0–52.0)
Hemoglobin: 14.4 g/dL (ref 13.0–17.0)
Immature Granulocytes: 1 %
Lymphocytes Relative: 10 %
Lymphs Abs: 0.7 10*3/uL (ref 0.7–4.0)
MCH: 24.7 pg — ABNORMAL LOW (ref 26.0–34.0)
MCHC: 29.8 g/dL — ABNORMAL LOW (ref 30.0–36.0)
MCV: 82.7 fL (ref 80.0–100.0)
Monocytes Absolute: 0.7 10*3/uL (ref 0.1–1.0)
Monocytes Relative: 11 %
Neutro Abs: 4.9 10*3/uL (ref 1.7–7.7)
Neutrophils Relative %: 76 %
Platelets: 175 10*3/uL (ref 150–400)
RBC: 5.84 MIL/uL — ABNORMAL HIGH (ref 4.22–5.81)
RDW: 24.6 % — ABNORMAL HIGH (ref 11.5–15.5)
WBC: 6.5 10*3/uL (ref 4.0–10.5)
nRBC: 0 % (ref 0.0–0.2)

## 2022-08-30 LAB — GLUCOSE, CAPILLARY
Glucose-Capillary: 106 mg/dL — ABNORMAL HIGH (ref 70–99)
Glucose-Capillary: 113 mg/dL — ABNORMAL HIGH (ref 70–99)
Glucose-Capillary: 111 mg/dL — ABNORMAL HIGH (ref 70–99)
Glucose-Capillary: 124 mg/dL — ABNORMAL HIGH (ref 70–99)

## 2022-08-30 LAB — BRAIN NATRIURETIC PEPTIDE: B Natriuretic Peptide: 2460.3 pg/mL — ABNORMAL HIGH (ref 0.0–100.0)

## 2022-08-30 LAB — MAGNESIUM: Magnesium: 2.4 mg/dL (ref 1.7–2.4)

## 2022-08-30 MED ORDER — FUROSEMIDE 10 MG/ML IJ SOLN
20.0000 mg | Freq: Once | INTRAMUSCULAR | Status: AC
  Administered 2022-08-30: 20 mg via INTRAVENOUS
  Filled 2022-08-30: qty 2

## 2022-08-30 MED ORDER — BOOST / RESOURCE BREEZE PO LIQD CUSTOM
1.0000 | Freq: Three times a day (TID) | ORAL | Status: DC
  Administered 2022-08-31 – 2022-09-03 (×3): 1 via ORAL

## 2022-08-30 MED ORDER — ADULT MULTIVITAMIN W/MINERALS CH
1.0000 | ORAL_TABLET | Freq: Every day | ORAL | Status: DC
  Administered 2022-08-30 – 2022-09-03 (×5): 1 via ORAL
  Filled 2022-08-30 (×5): qty 1

## 2022-08-30 NOTE — Progress Notes (Signed)
Nutrition Follow-up  DOCUMENTATION CODES:   Severe malnutrition in context of chronic illness  INTERVENTION:   -D/c Ensure -Boost Breeze po TID, each supplement provides 250 kcal and 9 grams of protein  -Magic cup BID with meals, each supplement provides 290 kcal and 9 grams of protein   -Multivitamin with minerals daily   NUTRITION DIAGNOSIS:   Severe Malnutrition related to chronic illness as evidenced by severe fat depletion, severe muscle depletion.  Ongoing.  GOAL:   Patient will meet greater than or equal to 90% of their needs  Not meeting.  MONITOR:   Weight trends, Diet advancement, Supplement acceptance, PO intake  ASSESSMENT:   89 y.o. male with medical history significant of complete heart block, COPD, depression, type 2 diabetes, colon diverticulosis, HLD, HTN who presented with rectal bleeding. 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  Patient currently consuming 0-20% of meals. Has been refusing meals. Has been more lethargic as well. Pt not accepting Ensure but was accepting Boost Breeze so will switch orders. Will add daily MVI given poor PO intakes.   Admission weight: 152 lbs Current weight: 160 lbs  Medications: Vitamin D, Lasix, Mag-OX, Vitamin B-12, Imodium  Labs reviewed: CBGs: 106-138  Diet Order:   Diet Order             Diet regular Room service appropriate? Yes; Fluid consistency: Thin  Diet effective now                   EDUCATION NEEDS:   Education needs have been addressed  Skin:  Skin Assessment: Reviewed RN Assessment  Last BM:  4/1 -type 6  Height:   Ht Readings from Last 1 Encounters:  08/24/22 6\' 1"  (1.854 m)    Weight:   Wt Readings from Last 1 Encounters:  08/30/22 72.7 kg    BMI:  Body mass index is 21.15 kg/m.  Estimated Nutritional Needs:   Kcal:  1950-2100 kcals  Protein:  80-100 grams  Fluid:  >/= 1.9L   Clayton Bibles, MS, RD, LDN Inpatient Clinical Dietitian Contact information available  via Amion

## 2022-08-30 NOTE — TOC Progression Note (Signed)
Transition of Care [facility]) - Progression Note    Patient Details  Name: Daniel Woodard MRN: [id number] Date of Birth: Aug 29, 1929  [facility] CM/SW Contact  Kadarious Dikes, Juliann Pulse, RN Phone Number: 08/30/2022, 3:00 PM  Clinical Narrative: Bed offers given awaiting choice prior auth.     Expected Discharge Plan: [place] Barriers to Discharge: Continued Medical Work up  Expected Discharge Plan and Services   Discharge Planning Services: CM Consult Post Acute Care Choice: [place] Living arrangements for the past 2 months: Single Family Home                                       Social Determinants of Health (SDOH) Interventions SDOH Screenings   Food Insecurity: No Food Insecurity (08/22/2022)  Housing: Low Risk  (08/22/2022)  Transportation Needs: No Transportation Needs (08/22/2022)  Utilities: Not At Risk (08/22/2022)  Alcohol Screen: Low Risk  (02/25/2022)  Depression (PHQ2-9): High Risk (08/02/2022)  Financial Resource Strain: Low Risk  (02/25/2022)  Physical Activity: Inactive (02/25/2022)  Social Connections: Unknown (02/25/2022)  Stress: No Stress Concern Present (02/25/2022)  Tobacco Use: Medium Risk (08/25/2022)    Readmission Risk Interventions     No data to display

## 2022-08-30 NOTE — Progress Notes (Signed)
PT Cancellation Note  Patient Details Name: [name] MRN: [id number] DOB: 1930/05/27   Cancelled Treatment:    Reason Eval/Treat Not Completed: Fatigue/lethargy limiting ability to participate. PT made 2 attempts in am for intervention. Nurse indicated pt c/o SOB when taking medications ~ 9:45. PT returned 10:30 and pt reported he is just "give out" and cannot do anything. PT to return later in the day for tx session pending availability.   Baird Lyons, PT  Adair Patter 08/30/2022, 10:37 AM

## 2022-08-30 NOTE — Progress Notes (Signed)
PROGRESS NOTE    Daniel Woodard  [id number] DOB: Aug 20, 1929 DOA: 08/22/2022 PCP: Biagio Borg, MD    Chief Complaint  Patient presents with   Rectal Bleeding    Brief Narrative:   89 yo male with pmh of allergic rhinitis, atrial fibrillation not on anticoagulation MAC bacteremia, BPH, bradycardia, carotid artery occlusion, colon polyps, complete heart block, permanent pacemaker placement, COPD, depression, type 2 diabetes, colon diverticulosis, hyperlipidemia, hypertension, PVD, history of pneumonia, history of herpes zoster, perirectal abscess in January 2023 requiring an incision and drainage now presents to the ER with bright red bleeding with blood clots.  Denied abdominal pain nausea vomiting constipation or melena.  Denied fever or chills.  He lost about 8 pounds. Admission hemoglobin was 17.5 repeat was 14.6.   CTA -negative for active GI bleed but showed a U-shaped collar fluid density concerning for a perirectal abscess posterior to and abutting the low rectum/anus with an associated rim-enhancing fistula from the left side of the low rectum/anus down to the left skin surface of the intergluteal fold. Other CTA findings included prominent calcific plaque at the ostium of the SMA with probable stenosis, tiny 4-5 mm ulcerated plaque or penetrating ulcer anterior wall abdominal aorta just inferior to the IMA origin, left lower lobe collapse with small to moderate bilateral pleural effusions (left greater than right), subtle nodularity of liver contour suggestive of possible cirrhosis, diffuse body wall edema and aortic atherosclerosis.  Patient seen in consultation by gastroenterology, Dr. Lyndel Safe who recommended flexible sigmoidoscopy for further evaluation which was done 08/23/2022 without clear source of bleeding, bloody drainage noted from fistula tract.  Patient being followed by general surgery and patient underwent EUA with I&D of small recurrent perirectal abscess, 12/24/2022.   Patient also placed empirically on IV antibiotics.   Assessment & Plan:   Principal Problem:   Rectal bleeding Active Problems:   Hyperlipidemia   Essential hypertension   COPD GOLD II / AB component    CKD (chronic kidney disease), stage III   Aortic atherosclerosis   Perirectal abscess   Coronary artery disease involving native coronary artery of native heart without angina pectoris   Chronic systolic CHF (congestive heart failure)   Protein-calorie malnutrition, severe   Chest congestion   Acute on chronic systolic CHF (congestive heart failure)  #1 concern for perianal fistula with active bleeding/recurrent perirectal abscess -Patient appears to be with some rectal bleeding, CT scan done with U-shaped color fluid density with peripheral rim enhancement posterior to and abutting the lower rectum and anus. -Patient underwent flexible sigmoidoscopy by GI, with inadequate colonic prep, perianal fistula with active bleeding and stool in the rectum was noted. -General surgery following and patient subsequently underwent EUA, drainage of small recurrent perirectal abscess, 08/24/2022 -Hemoglobin currently stable at 14.5 from 17.5 on day of admission. -Was on IV Zosyn and has been transitioned to Augmentin. -Patient assessed by general surgery on 08/26/2022, who removed packing noted minimal drainage from fistula tract, no frank blood noted no cellulitis noted and recommended 3 times daily sitz bath's and as needed for contamination with stool. -General surgery recommended total of 7 days of antibiotics. -Outpatient follow-up with general surgery.  -Per general surgery.  2.  Hyperlipidemia -Statin on hold, resume on discharge.   3.  Chest Congestion -Per daughter patient congested over the past 2 months, noted to have diffuse rhonchorous breath sounds on examination on 08/24/2022. -Rhonchorous breath sounds improved while sitting up in recliner.. -Chest x-ray done on admission negative  for any acute infiltrate. -Patient currently afebrile. -Normal white count. -Was on IV Zosyn and transitioned to Augmentin for recurrent perirectal abscess.  -Continue Mucinex, Claritin, Flonase, incentive spirometry, flutter valve.   -IV Robinul x 1 dose given.. -Patient noted with increased O2 requirements and a stat chest x-ray done today concerning for volume overload. -Currently on IV Lasix with good urine output.   -Supportive care.    4.  Hypertension -Blood pressure soft initially on presentation and as such Lasix and metoprolol held.  -Status post IV albumin x 1. -Blood pressure improved, metoprolol resumed.  Patient on IV diuretics.  5.  CAD -Blood pressure soft and as such patient beta-blocker held.   -Beta-blocker resumed.  Statin on hold.   -Patient on IV diuretics due to concerns for volume overload.  6.  Acute on chronic systolic heart failure. -Patient with increased need for O2, with hypoxia and noted to have required 6 L O2 the evening of 08/28/2022.  Currently on 3 L nasal cannula with sats ranging from 89-98.  -Patient with coarse gurgling breath sounds on examination with some complaints of shortness of breath.  -BNP elevated at 2460.3. -Chest x-ray done concerning for volume overload.  -Patient started on Lasix 20 mg IV every 12 hours with urine output of 1.350 L over the past 24 hours.   -Continue Lasix 20 mg IV every 12 hours.  Will give extra dose of Lasix 20 mg IV x 1.   -Strict I's and O's, daily weights. -Follow.  7.  CKD stage IIIb -Stable. -Monitor volume status closely with diuretics..  8.  Hypokalemia -Repleted.  -Monitor with diuretics. -Repeat labs in the AM.    DVT prophylaxis: SCDs Code Status: DNR Family Communication: Updated patient, updated daughter at bedside.   Disposition: SNF when medically stable with improvement with respiratory status.   Status is: Inpatient Remains inpatient appropriate because: Severity of illness    Consultants:  Gastroenterology: Dr. Lyndel Safe 08/22/2022 General surgery: Dr. Donne Hazel 08/22/2022  Procedures:  Chest x-ray 08/22/2022, 08/29/2022 CT angiogram abdomen and pelvis 08/22/2022 Flexible sigmoidoscopy per GI: Dr. Lyndel Safe 08/23/2022 EUA, drainage of small recurrent perirectal abscess per general surgery: Dr. Donne Hazel 08/24/2022  Antimicrobials:  IV Zosyn 08/22/2022>>>>> 08/26/2022 Augmentin 08/26/2022>>> 08/31/2022   Subjective: Patient laying in bed.  States he is peeing currently.  Feels breathing is somewhat improved from yesterday.  No chest pain.  No abdominal pain.  No rectal bleeding.'s daughter at bedside.     Objective: Vitals:   08/30/22 0500 08/30/22 0553 08/30/22 0856 08/30/22 0944  BP:  105/76    Pulse:  (!) 101  (!) 102  Resp:  17    Temp:  (!) 97.5 F (36.4 C)    TempSrc:  Oral    SpO2:  98% 98% 93%  Weight: 72.7 kg     Height:        Intake/Output Summary (Last 24 hours) at 08/30/2022 1153 Last data filed at 08/30/2022 1034 Gross per 24 hour  Intake 445 ml  Output 1050 ml  Net -605 ml    Filed Weights   08/24/22 1106 08/30/22 0436 08/30/22 0500  Weight: 70.9 kg 70.8 kg 72.7 kg    Examination:  General exam: NAD. Respiratory system: Coarse gurgling breath sounds diffusely.  No significant wheezing noted.  Fair air movement.  Speaking in full sentences.    Cardiovascular system: Heart RRR no murmurs rubs or gallops.  No JVD.  Trace bilateral lower extremity edema.   Gastrointestinal system: Abdomen  is soft, nontender, nondistended, positive bowel sounds.  No rebound.  No guarding.  Central nervous system: Alert and oriented. No focal neurological deficits. Extremities: Symmetric 5 x 5 power. Skin: No rashes, lesions or ulcers Psychiatry: Judgement and insight appear normal. Mood & affect appropriate.     Data Reviewed: I have personally reviewed following labs and imaging studies  CBC: Recent Labs  Lab 08/24/22 0311 08/25/22 0529 08/26/22 0447  08/27/22 0528 08/30/22 0524  WBC 6.4 5.5 5.9 5.9 6.5  NEUTROABS  --   --  4.3  --  4.9  HGB 14.8 13.7 13.4 14.5 14.4  HCT 49.7 45.6 45.3 47.7 48.3  MCV 82.1 81.7 83.0 80.4 82.7  PLT 161 149* 171 173 175     Basic Metabolic Panel: Recent Labs  Lab 08/24/22 0311 08/25/22 0529 08/26/22 0447 08/27/22 0528 08/30/22 0524  NA 137 137 139 139 141  K 3.6 3.2* 3.6 4.6 3.8  CL 102 102 106 106 106  CO2 23 25 25 23 25   GLUCOSE 103* 126* 117* 123* 91  BUN 24* 21 18 20  27*  CREATININE 1.44* 1.36* 1.24 1.31* 1.53*  CALCIUM 7.8* 8.0* 8.2* 8.6* 8.7*  MG  --  2.3  --   --  2.4     GFR: Estimated Creatinine Clearance: 33 mL/min (A) (by C-G formula based on SCr of 1.53 mg/dL (H)).  Liver Function Tests: Recent Labs  Lab 08/24/22 0311 08/25/22 0529  AST 27 25  ALT 17 17  ALKPHOS 165* 148*  BILITOT 2.1* 1.9*  PROT 5.9* 5.9*  ALBUMIN 2.6* 3.0*     CBG: Recent Labs  Lab 08/29/22 1156 08/29/22 1640 08/29/22 2035 08/30/22 0748 08/30/22 1119  GLUCAP 142* 126* 121* 106* 113*      Recent Results (from the past 240 hour(s))  MRSA Next Gen by PCR, Nasal     Status: None   Collection Time: 08/22/22  8:59 PM   Specimen: Nasal Mucosa; Nasal Swab  Result Value Ref Range Status   MRSA by PCR Next Gen NOT DETECTED NOT DETECTED Final    Comment: (NOTE) The GeneXpert MRSA Assay (FDA approved for NASAL specimens only), is one component of a comprehensive MRSA colonization surveillance program. It is not intended to diagnose MRSA infection nor to guide or monitor treatment for MRSA infections. Test performance is not FDA approved in patients less than 32 years old. Performed at [facility], [place] [address] [place], [place] [address]          Radiology Studies: DG CHEST PORT 1 VIEW  Result Date: 08/29/2022 CLINICAL DATA:  Shortness of breath EXAM: PORTABLE CHEST 1 VIEW COMPARISON:  Portable exam 1219 hours compared to 08/22/2022 FINDINGS: LEFT subclavian  pacemaker with leads projecting over RIGHT atrium, RIGHT ventricle, and coronary sinus. Enlargement of cardiac silhouette with pulmonary vascular congestion. Atherosclerotic calcification aorta. Bibasilar effusions and atelectasis, slightly increased. Hazy interstitial infiltrates favoring pulmonary edema, increased. No pneumothorax or acute osseous findings. IMPRESSION: Increased pulmonary infiltrates question pulmonary edema. Bibasilar effusions and atelectasis, increased. Aortic Atherosclerosis (ICD10-I70.0). Electronically Signed   By: Lavonia Dana M.D.   On: 08/29/2022 12:30        Scheduled Meds:  amoxicillin-clavulanate  1 tablet Oral Q12H   chlorhexidine  15 mL Mouth/Throat Once   Chlorhexidine Gluconate Cloth  6 each Topical Daily   cholecalciferol  5,000 Units Oral Daily   feeding supplement  237 mL Oral BID BM   fluticasone  2 spray Each Nare  Daily   furosemide  20 mg Intravenous Q12H   guaiFENesin  1,200 mg Oral BID   levalbuterol  0.63 mg Nebulization TID   levothyroxine  50 mcg Oral Q0600   loperamide  2 mg Oral Once   loratadine  10 mg Oral Daily   magnesium oxide  400 mg Oral QPM   metoprolol succinate  12.5 mg Oral Daily   pantoprazole  40 mg Oral Daily   vitamin B-12  100 mcg Oral Daily   Continuous Infusions:     LOS: 7 days    Time spent: 35 minutes    Irine Seal, MD [facility]   To contact the attending provider between 7A-7P or the covering provider during after hours 7P-7A, please log into the web site [email or web address] and access using universal [place] password for that web site. If you do not have the password, please call the hospital operator.  08/30/2022, 11:53 AM

## 2022-08-30 NOTE — Plan of Care (Signed)
  Problem: Clinical Measurements: Goal: Respiratory complications will improve Outcome: Not Progressing   

## 2022-08-31 DIAGNOSIS — N1832 Chronic kidney disease, stage 3b: Secondary | ICD-10-CM | POA: Diagnosis not present

## 2022-08-31 DIAGNOSIS — K611 Rectal abscess: Secondary | ICD-10-CM | POA: Diagnosis not present

## 2022-08-31 DIAGNOSIS — I5023 Acute on chronic systolic (congestive) heart failure: Secondary | ICD-10-CM | POA: Diagnosis not present

## 2022-08-31 DIAGNOSIS — I10 Essential (primary) hypertension: Secondary | ICD-10-CM | POA: Diagnosis not present

## 2022-08-31 LAB — BASIC METABOLIC PANEL
Anion gap: 10 (ref 5–15)
BUN: 27 mg/dL — ABNORMAL HIGH (ref 8–23)
CO2: 25 mmol/L (ref 22–32)
Calcium: 8.3 mg/dL — ABNORMAL LOW (ref 8.9–10.3)
Chloride: 102 mmol/L (ref 98–111)
Creatinine, Ser: 1.47 mg/dL — ABNORMAL HIGH (ref 0.61–1.24)
GFR, Estimated: 45 mL/min — ABNORMAL LOW (ref >60)
Glucose, Bld: 89 mg/dL (ref 70–99)
Potassium: 3.8 mmol/L (ref 3.5–5.1)
Sodium: 137 mmol/L (ref 135–145)

## 2022-08-31 LAB — CBC WITH DIFFERENTIAL/PLATELET
Abs Immature Granulocytes: 0.04 10*3/uL (ref 0.00–0.07)
Basophils Absolute: 0.1 10*3/uL (ref 0.0–0.1)
Basophils Relative: 1 %
Eosinophils Absolute: 0.1 10*3/uL (ref 0.0–0.5)
Eosinophils Relative: 1 %
HCT: 48.3 % (ref 39.0–52.0)
Hemoglobin: 14.3 g/dL (ref 13.0–17.0)
Immature Granulocytes: 1 %
Lymphocytes Relative: 13 %
Lymphs Abs: 0.7 10*3/uL (ref 0.7–4.0)
MCH: 24.5 pg — ABNORMAL LOW (ref 26.0–34.0)
MCHC: 29.6 g/dL — ABNORMAL LOW (ref 30.0–36.0)
MCV: 82.7 fL (ref 80.0–100.0)
Monocytes Absolute: 0.7 10*3/uL (ref 0.1–1.0)
Monocytes Relative: 13 %
Neutro Abs: 4.2 10*3/uL (ref 1.7–7.7)
Neutrophils Relative %: 71 %
Platelets: 164 10*3/uL (ref 150–400)
RBC: 5.84 MIL/uL — ABNORMAL HIGH (ref 4.22–5.81)
RDW: 24.8 % — ABNORMAL HIGH (ref 11.5–15.5)
WBC: 5.8 10*3/uL (ref 4.0–10.5)
nRBC: 0 % (ref 0.0–0.2)

## 2022-08-31 LAB — BRAIN NATRIURETIC PEPTIDE: B Natriuretic Peptide: 1909.4 pg/mL — ABNORMAL HIGH (ref 0.0–100.0)

## 2022-08-31 LAB — GLUCOSE, CAPILLARY
Glucose-Capillary: 104 mg/dL — ABNORMAL HIGH (ref 70–99)
Glucose-Capillary: 122 mg/dL — ABNORMAL HIGH (ref 70–99)
Glucose-Capillary: 106 mg/dL — ABNORMAL HIGH (ref 70–99)
Glucose-Capillary: 114 mg/dL — ABNORMAL HIGH (ref 70–99)

## 2022-08-31 LAB — MAGNESIUM: Magnesium: 2.2 mg/dL (ref 1.7–2.4)

## 2022-08-31 NOTE — Progress Notes (Signed)
Physical Therapy Treatment Patient Details Name: Daniel Woodard MRN: [id number] DOB: [birth year] Today's Date: 08/31/2022   History of Present Illness Daniel Woodard is a 89 yo male admitted 08/22/22 with rectal bleed s/p flex-sig 3/26, S/p EUA, drainage of small recurrent perirectal abscess 08/24/22 by Dr. Donne Hazel. PMH: afib, bacteremia, complete heart block, COPD, diabetes, HTN, PVD, shingles, permanent pacemaker insertion    PT Comments    Pt with progress but limited.  Pt reporting difficulty breathing at arrival but VSS and improved with cues for pursed lip breathing and able to progress to chair with improved O2 sats once up.  Pt requiring encouragement to participate with therapy, easily fatigue, and only agreeable for transfer to chair and a few breathing exercises.  Encouraged to try to sit for at least an hour. Continue to progress as able.    Recommendations for follow up therapy are one component of a multi-disciplinary discharge planning process, led by the attending physician.  Recommendations may be updated based on patient status, additional functional criteria and insurance authorization.  Follow Up Recommendations  Can patient physically be transported by private vehicle: No    Assistance Recommended at Discharge Frequent or constant Supervision/Assistance  Patient can return home with the following A lot of help with walking and/or transfers;A lot of help with bathing/dressing/bathroom;Assistance with cooking/housework;Assist for transportation   Equipment Recommendations  None recommended by PT    Recommendations for Other Services       Precautions / Restrictions Precautions Precautions: Fall     Mobility  Bed Mobility Overal bed mobility: Needs Assistance Bed Mobility: Supine to Sit     Supine to sit: Min assist, HOB elevated     General bed mobility comments: Increased time and cues for all sequencing.  Pt reaching for therapist to just pull him up and  required cues for sequencing and attempting to move himself    Transfers Overall transfer level: Needs assistance Equipment used: Rolling walker (2 wheels) Transfers: Sit to/from Stand Sit to Stand: Mod assist, +2 safety/equipment   Step pivot transfers: Mod assist, +2 safety/equipment       General transfer comment: Cues for hand placement and to stand upright; pt fatiguing quickly and only wanting to go to chair.  Cues for breathing technique    Ambulation/Gait Ambulation/Gait assistance: Min assist Gait Distance (Feet): 4 Feet Assistive device: Rolling walker (2 wheels) Gait Pattern/deviations: Step-to pattern, Decreased stride length       General Gait Details: Steps to chair only; fatigued and declined further   Stairs             Wheelchair Mobility    Modified Rankin (Stroke Patients Only)       Balance Overall balance assessment: Needs assistance Sitting-balance support: Feet supported, No upper extremity supported Sitting balance-Leahy Scale: Good     Standing balance support: Reliant on assistive device for balance, During functional activity, Bilateral upper extremity supported Standing balance-Leahy Scale: Poor Standing balance comment: Min guard/min A with RW                            Cognition Arousal/Alertness: Awake/alert Behavior During Therapy: WFL for tasks assessed/performed Overall Cognitive Status: Within Functional Limits for tasks assessed                                 General Comments: Pleasant, WFL for basic tasks though  does appear to lack motivation to progress        Exercises      General Comments General comments (skin integrity, edema, etc.): Pt on 3 L O2 at arrival and had just called out to nursing due to difficulty breathing.  Daughter reports just had breathing tx.  Pt did not appear in distress, was not using accessory muscles.  Sats were 91% and HR 83 bpm.  Encouraged pt to relax and  take deep breaths in nose with pursed lip breathing.  Sats up to 94% and pt reports feeling better and wants to sit upright to see if improved.  Transferred to EOB sats up to 95% with cues for pursed lip breathing.  Pt then agreeable for transfer to chair.  Once in chair sats at 100%.  Pt reports fatigue and does not want to do more.  Did do 5 reps on flutter and incentive with cues for correct use.  RN was present at end of session.      Pertinent Vitals/Pain Pain Assessment Pain Assessment: No/denies pain    Home Living                          Prior Function            PT Goals (current goals can now be found in the care plan section) Progress towards PT goals: Progressing toward goals (limited)    Frequency    Min 2X/week      PT Plan Current plan remains appropriate    Co-evaluation              AM-PAC PT "6 Clicks" Mobility   Outcome Measure  Help needed turning from your back to your side while in a flat bed without using bedrails?: A Lot Help needed moving from lying on your back to sitting on the side of a flat bed without using bedrails?: A Lot Help needed moving to and from a bed to a chair (including a wheelchair)?: A Lot Help needed standing up from a chair using your arms (e.g., wheelchair or bedside chair)?: A Lot Help needed to walk in hospital room?: Total Help needed climbing 3-5 steps with a railing? : Total 6 Click Score: 10    End of Session Equipment Utilized During Treatment: Gait belt;Oxygen Activity Tolerance: Patient limited by fatigue Patient left: with call bell/phone within reach;with family/visitor present;with chair alarm set;in chair Nurse Communication: Mobility status PT Visit Diagnosis: Other abnormalities of gait and mobility (R26.89)     Time: [id number] PT Time Calculation (min) (ACUTE ONLY): 21 min  Charges:  $Therapeutic Activity: 8-22 mins                     Daniel Woodard, PT Acute Rehab [facility]  Rehab [phone]    Karlton Lemon 08/31/2022, 4:48 PM

## 2022-08-31 NOTE — Progress Notes (Signed)
PROGRESS NOTE    OPAL CASEBEER  [id number] DOB: [birth year] DOA: 08/22/2022 PCP: Biagio Borg, MD   Brief Narrative:   89 yo male with pmh of allergic rhinitis, atrial fibrillation not on anticoagulation MAC bacteremia, BPH, carotid artery occlusion, complete heart block status post permanent pacemaker placement, COPD, depression, type 2 diabetes, hyperlipidemia, hypertension, PVD, perirectal abscess in January 2023 requiring an incision and drainage presented with rectal bleeding.  On presentation, CTA negative for GI bleed but showed findings concerning for perirectal abscess with an associated rim-enhancing fistula from the left side of the low rectum/anus down to the left skin surface of the intergluteal fold.  He was started on broad-spectrum antibiotics.  Patient underwent flexible sigmoidoscopy by GI on 08/23/2022 without clear source of bleeding; bloody drainage noted from fistula tract.  Patient underwent EUA with I&D of small recurrent perirectal abscess by general surgery on 08/24/2022.  Subsequently antibiotics switched to Augmentin and has completed antibiotic treatment.  PT recommending SNF placement.  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  Patient underwent EUA with I&D of small recurrent perirectal abscess by general surgery on 08/24/2022.  Subsequently antibiotics switched to Augmentin and has completed antibiotic treatment. -Outpatient follow-up with general surgery.  Acute on chronic systolic heart failure Acute respiratory failure with hypoxia -Currently on IV Lasix 20 mg twice a day.  Diuresing well.  Still on 3 L oxygen by nasal cannula.  Wean off as able. -Strict input and output.  Daily weights.  Fluid restriction.  CKD stage IIIb -Creatinine currently stable.  Monitor while on  diuretics.  Hypertension -Blood pressure currently stable.  Continue Lasix and metoprolol  CAD History of complete heart block status post pacemaker placement Hyperlipidemia -Continue beta-blocker.  Statin on hold.  Outpatient follow-up with cardiology.  Physical deconditioning -Will need SNF placement.  TOC following.  Hypothyroidism -Continue levothyroxine   DVT prophylaxis: SCDs Code Status: DNR Family Communication: None at bedside Disposition Plan: Status is: Inpatient Remains inpatient appropriate because: Of need for SNF placement.    Consultants: GI/general surgery  Procedures: As above  Antimicrobials:  Anti-infectives (From admission, onward)    Start     Dose/Rate Route Frequency Ordered Stop   08/26/22 1400  amoxicillin-clavulanate (AUGMENTIN) 875-125 MG per tablet 1 tablet        1 tablet Oral Every 12 hours 08/26/22 0813 08/30/22 2047   08/22/22 1230  piperacillin-tazobactam (ZOSYN) IVPB 3.375 g  Status:  Discontinued        3.375 g 12.5 mL/hr over 240 Minutes Intravenous Every 8 hours 08/22/22 1143 08/26/22 0813        Subjective: Patient seen and examined at bedside.  Slow to respond, poor historian.  No fever, vomiting, chest pain reported.  Shortness of breath improving.  Objective: Vitals:   08/30/22 2013 08/31/22 0427 08/31/22 0500 08/31/22 0807  BP:  94/62    Pulse:  94    Resp:  18    Temp:  97.9 F (36.6 C)    TempSrc:  Axillary    SpO2: 94% 96%  95%  Weight:   68.5 kg   Height:        Intake/Output Summary (Last 24 hours) at 08/31/2022 1110 Last data filed at 08/31/2022 0416 Gross per 24 hour  Intake 360 ml  Output 1900 ml  Net -1540 ml  Filed Weights   08/30/22 0436 08/30/22 0500 08/31/22 0500  Weight: 70.8 kg 72.7 kg 68.5 kg    Examination:  General exam: Appears calm and comfortable.  Looks chronically ill and deconditioned.  On 3 L oxygen by nasal cannula.  Elderly male lying in bed. Respiratory system: Bilateral  decreased breath sounds at bases with scattered crackles Cardiovascular system: S1 & S2 heard, Rate controlled Gastrointestinal system: Abdomen is nondistended, soft and nontender. Normal bowel sounds heard. Extremities: No cyanosis, clubbing; trace lower extremity edema present Central nervous system: Alert and oriented.  Slow to respond.  Poor historian.  No focal neurological deficits. Moving extremities Skin: No rashes, lesions or ulcers Psychiatry: Affect is flat.  Not agitated.    Data Reviewed: I have personally reviewed following labs and imaging studies  CBC: Recent Labs  Lab 08/25/22 0529 08/26/22 0447 08/27/22 0528 08/30/22 0524 08/31/22 0512  WBC 5.5 5.9 5.9 6.5 5.8  NEUTROABS  --  4.3  --  4.9 4.2  HGB 13.7 13.4 14.5 14.4 14.3  HCT 45.6 45.3 47.7 48.3 48.3  MCV 81.7 83.0 80.4 82.7 82.7  PLT 149* 171 173 175 [id number]   Basic Metabolic Panel: Recent Labs  Lab 08/25/22 0529 08/26/22 0447 08/27/22 0528 08/30/22 0524 08/31/22 0512  NA 137 139 139 141 137  K 3.2* 3.6 4.6 3.8 3.8  CL 102 106 106 106 102  CO2 25 25 23 25 25   GLUCOSE 126* 117* 123* 91 89  BUN 21 18 20  27* 27*  CREATININE 1.36* 1.24 1.31* 1.53* 1.47*  CALCIUM 8.0* 8.2* 8.6* 8.7* 8.3*  MG 2.3  --   --  2.4 2.2   GFR: Estimated Creatinine Clearance: 32.4 mL/min (A) (by C-G formula based on SCr of 1.47 mg/dL (H)). Liver Function Tests: Recent Labs  Lab 08/25/22 0529  AST 25  ALT 17  ALKPHOS 148*  BILITOT 1.9*  PROT 5.9*  ALBUMIN 3.0*   No results for input(s): "LIPASE", "AMYLASE" in the last 168 hours. No results for input(s): "AMMONIA" in the last 168 hours. Coagulation Profile: No results for input(s): "INR", "PROTIME" in the last 168 hours. Cardiac Enzymes: No results for input(s): "CKTOTAL", "CKMB", "CKMBINDEX", "TROPONINI" in the last 168 hours. BNP (last 3 results) No results for input(s): "PROBNP" in the last 8760 hours. HbA1C: No results for input(s): "HGBA1C" in the last 72  hours. CBG: Recent Labs  Lab 08/30/22 0748 08/30/22 1119 08/30/22 1658 08/30/22 2040 08/31/22 0743  GLUCAP 106* 113* 111* 124* 104*   Lipid Profile: No results for input(s): "CHOL", "HDL", "LDLCALC", "TRIG", "CHOLHDL", "LDLDIRECT" in the last 72 hours. Thyroid Function Tests: No results for input(s): "TSH", "T4TOTAL", "FREET4", "T3FREE", "THYROIDAB" in the last 72 hours. Anemia Panel: No results for input(s): "VITAMINB12", "FOLATE", "FERRITIN", "TIBC", "IRON", "RETICCTPCT" in the last 72 hours. Sepsis Labs: No results for input(s): "PROCALCITON", "LATICACIDVEN" in the last 168 hours.  Recent Results (from the past 240 hour(s))  MRSA Next Gen by PCR, Nasal     Status: None   Collection Time: 08/22/22  8:59 PM   Specimen: Nasal Mucosa; Nasal Swab  Result Value Ref Range Status   MRSA by PCR Next Gen NOT DETECTED NOT DETECTED Final    Comment: (NOTE) The GeneXpert MRSA Assay (FDA approved for NASAL specimens only), is one component of a comprehensive MRSA colonization surveillance program. It is not intended to diagnose MRSA infection nor to guide or monitor treatment for MRSA infections. Test performance is not FDA approved in patients  less than 66 years old. Performed at [facility], [place] [address] [place], [place] [address]          Radiology Studies: DG CHEST PORT 1 VIEW  Result Date: 08/29/2022 CLINICAL DATA:  Shortness of breath EXAM: PORTABLE CHEST 1 VIEW COMPARISON:  Portable exam 1219 hours compared to 08/22/2022 FINDINGS: LEFT subclavian pacemaker with leads projecting over RIGHT atrium, RIGHT ventricle, and coronary sinus. Enlargement of cardiac silhouette with pulmonary vascular congestion. Atherosclerotic calcification aorta. Bibasilar effusions and atelectasis, slightly increased. Hazy interstitial infiltrates favoring pulmonary edema, increased. No pneumothorax or acute osseous findings. IMPRESSION: Increased pulmonary infiltrates question  pulmonary edema. Bibasilar effusions and atelectasis, increased. Aortic Atherosclerosis (ICD10-I70.0). Electronically Signed   By: Lavonia Dana M.D.   On: 08/29/2022 12:30        Scheduled Meds:  chlorhexidine  15 mL Mouth/Throat Once   Chlorhexidine Gluconate Cloth  6 each Topical Daily   cholecalciferol  5,000 Units Oral Daily   feeding supplement  1 Container Oral TID BM   fluticasone  2 spray Each Nare Daily   furosemide  20 mg Intravenous Q12H   guaiFENesin  1,200 mg Oral BID   levalbuterol  0.63 mg Nebulization TID   levothyroxine  50 mcg Oral Q0600   loperamide  2 mg Oral Once   loratadine  10 mg Oral Daily   magnesium oxide  400 mg Oral QPM   metoprolol succinate  12.5 mg Oral Daily   multivitamin with minerals  1 tablet Oral Daily   pantoprazole  40 mg Oral Daily   vitamin B-12  100 mcg Oral Daily   Continuous Infusions:        Aline August, MD [facility] 08/31/2022, 11:10 AM

## 2022-08-31 NOTE — TOC Progression Note (Addendum)
Transition of Care [facility]) - Progression Note    Patient Details  Name: Daniel Woodard MRN: [id number] Date of Birth: 01-Feb-1930  Transition [facility] Care [facility]) CM/SW Contact  Liyanna Cartwright, Juliann Pulse, RN Phone Number: 08/31/2022, 11:10 AM  Clinical Narrative:  Spoke to Pam(dtr) awaiting choice for ST SNF prior auth.  -2:52p-received call from Teresa(dtr) chose [facility], [place] call back.    Expected Discharge Plan: [place] Barriers to Discharge: Continued Medical Work up  Expected Discharge Plan and Services   Discharge Planning Services: CM Consult Post Acute Care Choice: [place] Living arrangements for the past 2 months: Single Family Home                                       Social Determinants of Health (SDOH) Interventions SDOH Screenings   Food Insecurity: No Food Insecurity (08/22/2022)  Housing: Low Risk  (08/22/2022)  Transportation Needs: No Transportation Needs (08/22/2022)  Utilities: Not At Risk (08/22/2022)  Alcohol Screen: Low Risk  (02/25/2022)  Depression (PHQ2-9): High Risk (08/02/2022)  Financial Resource Strain: Low Risk  (02/25/2022)  Physical Activity: Inactive (02/25/2022)  Social Connections: Unknown (02/25/2022)  Stress: No Stress Concern Present (02/25/2022)  Tobacco Use: Medium Risk (08/25/2022)    Readmission Risk Interventions     No data to display

## 2022-08-31 NOTE — Progress Notes (Signed)
Occupational Therapy Treatment Patient Details Name: Daniel Woodard MRN: [id number] DOB: 04/21/1930 Today's Date: 08/31/2022   History of present illness Daniel Woodard is a 89 yo male admitted 08/22/22 with rectal bleed s/p flex-sig 3/26, S/p EUA, drainage of small recurrent perirectal abscess 08/24/22 by Dr. Donne Hazel. PMH: afib, bacteremia, complete heart block, COPD, diabetes, HTN, PVD, shingles, permanent pacemaker insertion   OT comments  Pt w/ continued quick fatigue during tasks. Pt able to sit EOB for brief activities, initially interested in standing with RW then deferred due to fatigue. Encouraged AROM exercises bed level, therapeutic tasks, flutter valve/IS use and EOB/OOB attempts to address strength and respiratory deficits. Pt's daughter at bedside and supportive, hopeful for pt to provide more initiative in regaining independence.    Recommendations for follow up therapy are one component of a multi-disciplinary discharge planning process, led by the attending physician.  Recommendations may be updated based on patient status, additional functional criteria and insurance authorization.    Assistance Recommended at Discharge Frequent or constant Supervision/Assistance  Patient can return home with the following  A lot of help with walking and/or transfers;Direct supervision/assist for medications management;Assist for transportation;Help with stairs or ramp for entrance;A lot of help with bathing/dressing/bathroom;Assistance with cooking/housework   Equipment Recommendations  Other (comment);Wheelchair (measurements OT);Wheelchair cushion (measurements OT) (TBD pending progress)    Recommendations for Other Services      Precautions / Restrictions Precautions Precautions: Fall Precaution Comments: monitor O2/DOE Restrictions Weight Bearing Restrictions: No       Mobility Bed Mobility Overal bed mobility: Needs Assistance Bed Mobility: Supine to Sit, Sit to Supine      Supine to sit: Min assist, HOB elevated Sit to supine: Mod assist   General bed mobility comments: able to easily swing LE to EOB, Min A to lift trunk. Mod A for BLE back to bed    Transfers Overall transfer level: Needs assistance                 General transfer comment: initially requested RW for standing trial but w/ continued EOB tasks, pt reporting fatigue and declined to stand. able to scoot up along [facility] fairly easily without assist     Balance Overall balance assessment: Needs assistance Sitting-balance support: Feet supported, No upper extremity supported Sitting balance-Leahy Scale: Fair                                     ADL either performed or assessed with clinical judgement   ADL Overall ADL's : Needs assistance/impaired                                       General ADL Comments: Focus on EOB attempts, breathing techniques and basic tasks that pt can do to increase strength and respiratory status (EOB/OOB, flutter valve and IS, AROM exercises, pulling self forward w/ bedrails and increasing PO intake)    Extremity/Trunk Assessment Upper Extremity Assessment Upper Extremity Assessment: Generalized weakness   Lower Extremity Assessment Lower Extremity Assessment: Defer to PT evaluation        Vision   Vision Assessment?: No apparent visual deficits   Perception     Praxis      Cognition Arousal/Alertness: Awake/alert Behavior During Therapy: WFL for tasks assessed/performed Overall Cognitive Status: Within Functional Limits for tasks assessed  General Comments: pleasant, WFL for basic tasks though does appear to lack motivation to progress. Pt endorses "giving up"        Exercises Exercises: Other exercises Other Exercises Other Exercises: shoulder flexion AROM x 5 Other Exercises: pulling self to long sitting with B bedrails    Shoulder Instructions        General Comments Daughter present and supportive    Pertinent Vitals/ Pain       Pain Assessment Pain Assessment: No/denies pain  Home Living                                          Prior Functioning/Environment              Frequency  Min 2X/week        Progress Toward Goals  OT Goals(current goals can now be found in the care plan section)  Progress towards OT goals: Progressing toward goals  Acute Rehab OT Goals Patient Stated Goal: rest a little bit OT Goal Formulation: With family Time For Goal Achievement: 09/08/22 Potential to Achieve Goals: Fair ADL Goals Pt Will Perform Grooming: with supervision;with set-up;sitting Pt Will Perform Upper Body Bathing: with min guard assist;sitting Pt Will Perform Lower Body Bathing: with mod assist;sitting/lateral leans Pt Will Perform Upper Body Dressing: with min guard assist;sitting Pt Will Transfer to Toilet: with mod assist;with min assist;ambulating;stand pivot transfer Pt Will Perform Toileting - Clothing Manipulation and hygiene: with max assist;with mod assist;sit to/from stand;sitting/lateral leans  Plan Discharge plan remains appropriate    Co-evaluation                 AM-PAC OT "6 Clicks" Daily Activity     Outcome Measure   Help from another person eating meals?: A Little Help from another person taking care of personal grooming?: A Little Help from another person toileting, which includes using toliet, bedpan, or urinal?: Total Help from another person bathing (including washing, rinsing, drying)?: A Lot Help from another person to put on and taking off regular upper body clothing?: A Little Help from another person to put on and taking off regular lower body clothing?: Total 6 Click Score: 13    End of Session Equipment Utilized During Treatment: Oxygen  OT Visit Diagnosis: Unsteadiness on feet (R26.81);Other abnormalities of gait and mobility (R26.89);Repeated falls  (R29.6);Muscle weakness (generalized) (M62.81)   Activity Tolerance Patient limited by fatigue   Patient Left in bed;with call bell/phone within reach;with bed alarm set;with family/visitor present   Nurse Communication Mobility status        Time: [id number] OT Time Calculation (min): 21 min  Charges: OT General Charges $OT Visit: 1 Visit OT Treatments $Therapeutic Activity: 8-22 mins  Malachy Chamber, OTR/L Acute Rehab Services Office: [phone]   Layla Maw 08/31/2022, 12:36 PM

## 2022-09-01 DIAGNOSIS — K625 Hemorrhage of anus and rectum: Secondary | ICD-10-CM | POA: Diagnosis not present

## 2022-09-01 DIAGNOSIS — E43 Unspecified severe protein-calorie malnutrition: Secondary | ICD-10-CM | POA: Diagnosis not present

## 2022-09-01 DIAGNOSIS — I5023 Acute on chronic systolic (congestive) heart failure: Secondary | ICD-10-CM | POA: Diagnosis not present

## 2022-09-01 LAB — GLUCOSE, CAPILLARY
Glucose-Capillary: 87 mg/dL (ref 70–99)
Glucose-Capillary: 106 mg/dL — ABNORMAL HIGH (ref 70–99)

## 2022-09-01 NOTE — TOC Progression Note (Signed)
Transition of Care [facility]) - Progression Note    Patient Details  Name: Daniel Woodard MRN: [id number] Date of Birth: 27-Apr-1930  Transition of Care [facility]) CM/SW Contact  Kolston Lacount, Juliann Pulse, RN Phone Number: 09/01/2022, 12:38 PM  Clinical Narrative: Rogelia Rohrer for [facility] rep Erling Conte pending Josem Kaufmann [id number]. Awaiting auth. PTAR can be provided @ d/c since under 50 mile radius.      Expected Discharge Plan: Skilled Nursing Facility Barriers to Discharge: Insurance Authorization  Expected Discharge Plan and Services   Discharge Planning Services: CM Consult Post Acute Care Choice: [place] Living arrangements for the past 2 months: Single Family Home                                       Social Determinants of Health (SDOH) Interventions SDOH Screenings   Food Insecurity: No Food Insecurity (08/22/2022)  Housing: Low Risk  (08/22/2022)  Transportation Needs: No Transportation Needs (08/22/2022)  Utilities: Not At Risk (08/22/2022)  Alcohol Screen: Low Risk  (02/25/2022)  Depression (PHQ2-9): High Risk (08/02/2022)  Financial Resource Strain: Low Risk  (02/25/2022)  Physical Activity: Inactive (02/25/2022)  Social Connections: Unknown (02/25/2022)  Stress: No Stress Concern Present (02/25/2022)  Tobacco Use: Medium Risk (08/25/2022)    Readmission Risk Interventions     No data to display

## 2022-09-01 NOTE — Progress Notes (Addendum)
Mobility Specialist - Progress Note   09/01/22 1020  Mobility  Activity Transferred from bed to chair  Level of Assistance Moderate assist, patient does 50-74%  Assistive Device Front wheel walker  Distance Ambulated (ft) 5 ft  Activity Response Tolerated well  Mobility Referral Yes  $Mobility charge 1 Mobility   Pt received in bed and agreeable to transfer to recliner from bed. Pt was ModA from sit-to-stand. Daughter present to help w/ transfer. No complaints during session. Pt to recliner after session with all needs met.    [facility]

## 2022-09-01 NOTE — Plan of Care (Signed)
  Problem: Activity: Goal: Risk for activity intolerance will decrease Outcome: Completed/Met   Problem: Clinical Measurements: Goal: Will remain free from infection Outcome: Adequate for Discharge Goal: Diagnostic test results will improve Outcome: Adequate for Discharge   Problem: Coping: Goal: Level of anxiety will decrease Outcome: Adequate for Discharge   Problem: Elimination: Goal: Will not experience complications related to bowel motility Outcome: Adequate for Discharge   Problem: Pain Managment: Goal: General experience of comfort will improve Outcome: Adequate for Discharge

## 2022-09-01 NOTE — Care Management Important Message (Signed)
Important Message  Patient Details IM Letter given Name: Daniel Woodard MRN: [id number] Date of Birth: Nov 01, 1929   Medicare Important Message Given:  Yes     Kerin Salen 09/01/2022, 3:44 PM

## 2022-09-01 NOTE — Progress Notes (Signed)
PROGRESS NOTE    KIONTE SURMAN  [id number] DOB: [birth year] DOA: 08/22/2022 PCP: Biagio Borg, MD   Brief Narrative:   89 yo male with pmh of allergic rhinitis, atrial fibrillation not on anticoagulation MAC bacteremia, BPH, carotid artery occlusion, complete heart block status post permanent pacemaker placement, COPD, depression, type 2 diabetes, hyperlipidemia, hypertension, PVD, perirectal abscess in January 2023 requiring an incision and drainage presented with rectal bleeding.  On presentation, CTA negative for GI bleed but showed findings concerning for perirectal abscess with an associated rim-enhancing fistula from the left side of the low rectum/anus down to the left skin surface of the intergluteal fold.  He was started on broad-spectrum antibiotics.  Patient underwent flexible sigmoidoscopy by GI on 08/23/2022 without clear source of bleeding; bloody drainage noted from fistula tract.  Patient underwent EUA with I&D of small recurrent perirectal abscess by general surgery on 08/24/2022.  Subsequently antibiotics switched to Augmentin and has completed antibiotic treatment.  PT recommending SNF placement.    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  Patient underwent EUA with I&D of small recurrent perirectal abscess by general surgery on 08/24/2022.  Subsequently antibiotics switched to Augmentin and has completed antibiotic treatment. -Outpatient follow-up with general surgery.  Acute on chronic systolic heart failure Acute respiratory failure with hypoxia -Currently on IV Lasix 20 mg twice a day.  Diuresing well.  Still on 3 L oxygen by nasal cannula.  Wean off as able.  Will switch to oral Lasix on discharge. -Strict input and output.  Daily weights.  Fluid restriction.  CKD stage  IIIb -Creatinine currently stable.  Monitor while on diuretics.  Hypertension -Blood pressure currently stable.  Continue Lasix and metoprolol  CAD History of complete heart block status post pacemaker placement Hyperlipidemia -Continue beta-blocker.  Statin on hold.  Outpatient follow-up with cardiology.  Physical deconditioning -Will need SNF placement.  TOC following.  Hypothyroidism -Continue levothyroxine   DVT prophylaxis: SCDs Code Status: DNR Family Communication: None at bedside Disposition Plan: Status is: Inpatient Remains inpatient appropriate because: Of need for SNF placement.    Consultants: GI/general surgery  Procedures: As above  Antimicrobials:  Anti-infectives (From admission, onward)    Start     Dose/Rate Route Frequency Ordered Stop   08/26/22 1400  amoxicillin-clavulanate (AUGMENTIN) 875-125 MG per tablet 1 tablet        1 tablet Oral Every 12 hours 08/26/22 0813 08/30/22 2047   08/22/22 1230  piperacillin-tazobactam (ZOSYN) IVPB 3.375 g  Status:  Discontinued        3.375 g 12.5 mL/hr over 240 Minutes Intravenous Every 8 hours 08/22/22 1143 08/26/22 0813        Subjective: Patient seen and examined at bedside.  Slow to respond, poor historian.  Denies worsening chest pain, fever or vomiting. Objective: Vitals:   08/31/22 1815 08/31/22 2053 09/01/22 0500 09/01/22 0509  BP:  119/70  102/75  Pulse:  65  82  Resp: (!) 21 18  20   Temp:  97.6 F (36.4 C)  97.7 F (36.5 C)  TempSrc:  Oral  Oral  SpO2:  96%  98%  Weight:   68.7 kg   Height:        Intake/Output Summary (Last 24 hours) at 09/01/2022 0752 Last data filed at 09/01/2022 0500 Gross per 24 hour  Intake 120 ml  Output 600 ml  Net -480 ml    Filed Weights   08/30/22 0500 08/31/22 0500 09/01/22 0500  Weight: 72.7 kg 68.5 kg 68.7 kg    Examination:  General: Currently on 3 L oxygen via nasal cannula.  No distress.  Chronically ill and deconditioned looking. ENT/neck: No  thyromegaly.  JVD is not elevated  respiratory: Decreased breath sounds at bases bilaterally with some crackles; some wheezing  CVS: S1-S2 heard, rate controlled currently Abdominal: Soft, nontender, slightly distended; no organomegaly, bowel sounds are heard Extremities: Mild lower extremity edema present; no cyanosis  CNS: Sleepy, wakes up slightly.  Slow to respond.  Poor historian.  No focal neurologic deficit.  Moves extremities Lymph: No obvious lymphadenopathy Skin: No obvious ecchymosis/lesions  psych: Mostly flat affect.  Currently not agitated. musculoskeletal: No obvious joint swelling/deformity     Data Reviewed: I have personally reviewed following labs and imaging studies  CBC: Recent Labs  Lab 08/26/22 0447 08/27/22 0528 08/30/22 0524 08/31/22 0512  WBC 5.9 5.9 6.5 5.8  NEUTROABS 4.3  --  4.9 4.2  HGB 13.4 14.5 14.4 14.3  HCT 45.3 47.7 48.3 48.3  MCV 83.0 80.4 82.7 82.7  PLT 171 173 175 [id number]    Basic Metabolic Panel: Recent Labs  Lab 08/26/22 0447 08/27/22 0528 08/30/22 0524 08/31/22 0512  NA 139 139 141 137  K 3.6 4.6 3.8 3.8  CL 106 106 106 102  CO2 25 23 25 25   GLUCOSE 117* 123* 91 89  BUN 18 20 27* 27*  CREATININE 1.24 1.31* 1.53* 1.47*  CALCIUM 8.2* 8.6* 8.7* 8.3*  MG  --   --  2.4 2.2    GFR: Estimated Creatinine Clearance: 32.5 mL/min (A) (by C-G formula based on SCr of 1.47 mg/dL (H)). Liver Function Tests: No results for input(s): "AST", "ALT", "ALKPHOS", "BILITOT", "PROT", "ALBUMIN" in the last 168 hours.  No results for input(s): "LIPASE", "AMYLASE" in the last 168 hours. No results for input(s): "AMMONIA" in the last 168 hours. Coagulation Profile: No results for input(s): "INR", "PROTIME" in the last 168 hours. Cardiac Enzymes: No results for input(s): "CKTOTAL", "CKMB", "CKMBINDEX", "TROPONINI" in the last 168 hours. BNP (last 3 results) No results for input(s): "PROBNP" in the last 8760 hours. HbA1C: No results for input(s):  "HGBA1C" in the last 72 hours. CBG: Recent Labs  Lab 08/31/22 0743 08/31/22 1128 08/31/22 1716 08/31/22 2046 09/01/22 0726  GLUCAP 104* 122* 106* 114* 87    Lipid Profile: No results for input(s): "CHOL", "HDL", "LDLCALC", "TRIG", "CHOLHDL", "LDLDIRECT" in the last 72 hours. Thyroid Function Tests: No results for input(s): "TSH", "T4TOTAL", "FREET4", "T3FREE", "THYROIDAB" in the last 72 hours. Anemia Panel: No results for input(s): "VITAMINB12", "FOLATE", "FERRITIN", "TIBC", "IRON", "RETICCTPCT" in the last 72 hours. Sepsis Labs: No results for input(s): "PROCALCITON", "LATICACIDVEN" in the last 168 hours.  Recent Results (from the past 240 hour(s))  MRSA Next Gen by PCR, Nasal     Status: None   Collection Time: 08/22/22  8:59 PM   Specimen: Nasal Mucosa; Nasal Swab  Result Value Ref Range Status   MRSA by PCR Next Gen NOT DETECTED NOT DETECTED Final    Comment: (NOTE) The GeneXpert MRSA Assay (FDA approved for NASAL specimens only), is one component of a comprehensive MRSA colonization surveillance program. It is not intended to diagnose MRSA infection nor to guide or monitor treatment for MRSA infections. Test performance is not FDA approved in patients less than 46 years old. Performed at [facility]  [place] [address] [place], [place] [address]          Radiology Studies: No results found.      Scheduled Meds:  chlorhexidine  15 mL Mouth/Throat Once   Chlorhexidine Gluconate Cloth  6 each Topical Daily   cholecalciferol  5,000 Units Oral Daily   feeding supplement  1 Container Oral TID BM   fluticasone  2 spray Each Nare Daily   furosemide  20 mg Intravenous Q12H   guaiFENesin  1,200 mg Oral BID   levalbuterol  0.63 mg Nebulization TID   levothyroxine  50 mcg Oral Q0600   loperamide  2 mg Oral Once   loratadine  10 mg Oral Daily   magnesium oxide  400 mg Oral QPM   metoprolol succinate  12.5 mg Oral Daily   multivitamin with  minerals  1 tablet Oral Daily   pantoprazole  40 mg Oral Daily   vitamin B-12  100 mcg Oral Daily   Continuous Infusions:        Aline August, MD [facility] 09/01/2022, 7:52 AM

## 2022-09-01 NOTE — Progress Notes (Signed)
Mobility Specialist - Progress Note   09/01/22 1130  Mobility  Activity Transferred to/from [facility]  Level of Assistance Moderate assist, patient does 50-74%  Assistive Device Front wheel walker  Distance Ambulated (ft) 5 ft  Activity Response Tolerated well  Mobility Referral Yes  $Mobility charge 1 Mobility   Pt received on BSC and agreeable to transfer to bed w/ help of NT. No complaints during transfer. Pt to bed after session w/ all needs met & nurse in room.   [facility]

## 2022-09-02 ENCOUNTER — Other Ambulatory Visit: Payer: Medicare Other

## 2022-09-02 DIAGNOSIS — I5023 Acute on chronic systolic (congestive) heart failure: Secondary | ICD-10-CM | POA: Diagnosis not present

## 2022-09-02 DIAGNOSIS — K625 Hemorrhage of anus and rectum: Secondary | ICD-10-CM | POA: Diagnosis not present

## 2022-09-02 LAB — BASIC METABOLIC PANEL
Anion gap: 11 (ref 5–15)
BUN: 37 mg/dL — ABNORMAL HIGH (ref 8–23)
CO2: 23 mmol/L (ref 22–32)
Calcium: 8.5 mg/dL — ABNORMAL LOW (ref 8.9–10.3)
Chloride: 102 mmol/L (ref 98–111)
Creatinine, Ser: 1.84 mg/dL — ABNORMAL HIGH (ref 0.61–1.24)
GFR, Estimated: 34 mL/min — ABNORMAL LOW (ref >60)
Glucose, Bld: 104 mg/dL — ABNORMAL HIGH (ref 70–99)
Potassium: 4 mmol/L (ref 3.5–5.1)
Sodium: 136 mmol/L (ref 135–145)

## 2022-09-02 LAB — MAGNESIUM: Magnesium: 2.5 mg/dL — ABNORMAL HIGH (ref 1.7–2.4)

## 2022-09-02 NOTE — TOC Progression Note (Signed)
Transition of Care [facility]) - Progression Note    Patient Details  Name: Daniel Woodard MRN: [id number] Date of Birth: Sep 15, 1929  [facility]) CM/SW Contact  Adrian Prows, RN Phone Number: 09/02/2022, 2:15 PM  Clinical Narrative:    Ins auth received; Plan Auth #[id number], FYBO ID# [id number]; start date 09/01/22 end date 09/05/22; notified Alinda Money at [facility] and she says pt can admit over weekend; pt's dtr Karin Lieu notified and agrees to d/c plan; Dr Hanley Ben notified, and he says pt not ready for d/c today; Alinda Money at facility notified; she requested call [phone]) when pt is ready for d/c; room # and call report # will be given when d/c summary received.   Expected Discharge Plan: Skilled Nursing Facility Barriers to Discharge: Insurance Authorization  Expected Discharge Plan and Services   Discharge Planning Services: CM Consult Post Acute Care Choice: Skilled Nursing Facility Living arrangements for the past 2 months: Single Family Home                                       Social Determinants of Health (SDOH) Interventions SDOH Screenings   Food Insecurity: No Food Insecurity (08/22/2022)  Housing: Low Risk  (08/22/2022)  Transportation Needs: No Transportation Needs (08/22/2022)  Utilities: Not At Risk (08/22/2022)  Alcohol Screen: Low Risk  (02/25/2022)  Depression (PHQ2-9): High Risk (08/02/2022)  Financial Resource Strain: Low Risk  (02/25/2022)  Physical Activity: Inactive (02/25/2022)  Social Connections: Unknown (02/25/2022)  Stress: No Stress Concern Present (02/25/2022)  Tobacco Use: Medium Risk (08/25/2022)    Readmission Risk Interventions     No data to display

## 2022-09-02 NOTE — Progress Notes (Signed)
PROGRESS NOTE    Daniel Woodard  KFE:[id number] DOB: 1929/12/14 DOA: 08/22/2022 PCP: [name], MD   Brief Narrative:   89 yo male with pmh of allergic rhinitis, atrial fibrillation not on anticoagulation MAC bacteremia, BPH, carotid artery occlusion, complete heart block status post permanent pacemaker placement, COPD, depression, type 2 diabetes, hyperlipidemia, hypertension, PVD, perirectal abscess in January 2023 requiring an incision and drainage presented with rectal bleeding.  On presentation, CTA negative for GI bleed but showed findings concerning for perirectal abscess with an associated rim-enhancing fistula from the left side of the low rectum/anus down to the left skin surface of the intergluteal fold.  He was started on broad-spectrum antibiotics.  Patient underwent flexible sigmoidoscopy by GI on 08/23/2022 without clear source of bleeding; bloody drainage noted from fistula tract.  Patient underwent EUA with I&D of small recurrent perirectal abscess by general surgery on 08/24/2022.  Subsequently antibiotics switched to Augmentin and has completed antibiotic treatment.  PT recommending SNF placement.    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  Patient underwent EUA with I&D of small recurrent perirectal abscess by general surgery on 08/24/2022.  Subsequently antibiotics switched to Augmentin and has completed antibiotic treatment. -Outpatient follow-up with general surgery.  Acute on chronic systolic heart failure Acute respiratory failure with hypoxia -Currently on IV Lasix 20 mg twice a day.  Diuresing well.  Still on 3 L oxygen by nasal cannula.  Wean off as able.  Hold Lasix today because of increasing creatinine. -Strict input and output.  Daily weights.  Fluid restriction.  AKI on CKD  stage IIIb -Creatinine bumped up to 1.84 today.  Diuretics plan as above.  Repeat a.m. creatinine.  Hypertension -Blood pressure currently stable.  Continue metoprolol  CAD History of complete heart block status post pacemaker placement Hyperlipidemia -Continue beta-blocker.  Statin on hold.  Outpatient follow-up with cardiology.  Physical deconditioning -Will need SNF placement.  TOC following.  Hypothyroidism -Continue levothyroxine   DVT prophylaxis: SCDs Code Status: DNR Family Communication: Daughter at bedside  disposition Plan: Status is: Inpatient Remains inpatient appropriate because: Of need for SNF placement.    Consultants: GI/general surgery  Procedures: As above  Antimicrobials:  Anti-infectives (From admission, onward)    Start     Dose/Rate Route Frequency Ordered Stop   08/26/22 1400  amoxicillin-clavulanate (AUGMENTIN) 875-125 MG per tablet 1 tablet        1 tablet Oral Every 12 hours 08/26/22 0813 08/30/22 2047   08/22/22 1230  piperacillin-tazobactam (ZOSYN) IVPB 3.375 g  Status:  Discontinued        3.375 g 12.5 mL/hr over 240 Minutes Intravenous Every 8 hours 08/22/22 1143 08/26/22 0813        Subjective: Patient seen and examined at bedside.  Slow to respond, poor historian.  No fever, chest pain, shortness of breath or agitation reported.  Daughter present at bedside states that his oral intake is improving. Objective: Vitals:   09/01/22 1329 09/01/22 2154 09/02/22 0500 09/02/22 0504  BP:  101/77  118/64  Pulse:  81  81  Resp:  16  17  Temp:  (!) 97.5 F (36.4 C)  98.3 F (36.8 C)  TempSrc:  Oral    SpO2: 97% 96%  97%  Weight:   68.8 kg   Height:  Intake/Output Summary (Last 24 hours) at 09/02/2022 0814 Last data filed at 09/02/2022 0500 Gross per 24 hour  Intake 402 ml  Output 500 ml  Net -98 ml    Filed Weights   08/31/22 0500 09/01/22 0500 09/02/22 0500  Weight: 68.5 kg 68.7 kg 68.8 kg    Examination:  General: No  acute distress.  Still on 3 L oxygen via nasal cannula.  Chronically ill and deconditioned looking. ENT/neck: No obvious JVD elevation or palpable neck masses noted respiratory: Bilateral decreased breath sounds at bases with scattered crackles  CVS: Rate mostly controlled; S1 and S2 are heard Abdominal: Soft, nontender, distended mildly; no organomegaly, bowel sounds are heard normally Extremities: No clubbing; trace lower extremity edema. CNS: Awake, answers some questions.  Still very slow to respond.  Poor historian.  No focal neurologic deficit.  Able to move extremities Lymph: No palpable cervical lymphadenopathy Skin: No obvious rashes/petechiae psych: Not agitated; flat affect  musculoskeletal: No obvious joint swelling/deformity     Data Reviewed: I have personally reviewed following labs and imaging studies  CBC: Recent Labs  Lab 08/27/22 0528 08/30/22 0524 08/31/22 0512  WBC 5.9 6.5 5.8  NEUTROABS  --  4.9 4.2  HGB 14.5 14.4 14.3  HCT 47.7 48.3 48.3  MCV 80.4 82.7 82.7  PLT 173 175 164    Basic Metabolic Panel: Recent Labs  Lab 08/27/22 0528 08/30/22 0524 08/31/22 0512 09/02/22 0519  NA 139 141 137 136  K 4.6 3.8 3.8 4.0  CL 106 106 102 102  CO2 23 25 25 23   GLUCOSE 123* 91 89 104*  BUN 20 27* 27* 37*  CREATININE 1.31* 1.53* 1.47* 1.84*  CALCIUM 8.6* 8.7* 8.3* 8.5*  MG  --  2.4 2.2 2.5*    GFR: Estimated Creatinine Clearance: 26 mL/min (A) (by C-G formula based on SCr of 1.84 mg/dL (H)). Liver Function Tests: No results for input(s): "AST", "ALT", "ALKPHOS", "BILITOT", "PROT", "ALBUMIN" in the last 168 hours.  No results for input(s): "LIPASE", "AMYLASE" in the last 168 hours. No results for input(s): "AMMONIA" in the last 168 hours. Coagulation Profile: No results for input(s): "INR", "PROTIME" in the last 168 hours. Cardiac Enzymes: No results for input(s): "CKTOTAL", "CKMB", "CKMBINDEX", "TROPONINI" in the last 168 hours. BNP (last 3  results) No results for input(s): "PROBNP" in the last 8760 hours. HbA1C: No results for input(s): "HGBA1C" in the last 72 hours. CBG: Recent Labs  Lab 08/31/22 1128 08/31/22 1716 08/31/22 2046 09/01/22 0726 09/01/22 1124  GLUCAP 122* 106* 114* 87 106*    Lipid Profile: No results for input(s): "CHOL", "HDL", "LDLCALC", "TRIG", "CHOLHDL", "LDLDIRECT" in the last 72 hours. Thyroid Function Tests: No results for input(s): "TSH", "T4TOTAL", "FREET4", "T3FREE", "THYROIDAB" in the last 72 hours. Anemia Panel: No results for input(s): "VITAMINB12", "FOLATE", "FERRITIN", "TIBC", "IRON", "RETICCTPCT" in the last 72 hours. Sepsis Labs: No results for input(s): "PROCALCITON", "LATICACIDVEN" in the last 168 hours.  No results found for this or any previous visit (from the past 240 hour(s)).        Radiology Studies: No results found.      Scheduled Meds:  chlorhexidine  15 mL Mouth/Throat Once   cholecalciferol  5,000 Units Oral Daily   feeding supplement  1 Container Oral TID BM   fluticasone  2 spray Each Nare Daily   furosemide  20 mg Intravenous Q12H   guaiFENesin  1,200 mg Oral BID   levalbuterol  0.63 mg Nebulization TID   levothyroxine  50 mcg Oral Q0600   loperamide  2 mg Oral Once   loratadine  10 mg Oral Daily   metoprolol succinate  12.5 mg Oral Daily   multivitamin with minerals  1 tablet Oral Daily   pantoprazole  40 mg Oral Daily   vitamin B-12  100 mcg Oral Daily   Continuous Infusions:        Glade Lloyd, MD [facility] 09/02/2022, 8:14 AM

## 2022-09-02 NOTE — Progress Notes (Signed)
Physical Therapy Treatment Patient Details Name: Daniel Woodard MRN: [id number] DOB: Sep 25, 1929 Today's Date: 09/02/2022   History of Present Illness Daniel Woodard is a 89 yo male admitted 08/22/22 with rectal bleed s/p flex-sig 3/26, S/p EUA, drainage of small recurrent perirectal abscess 08/24/22 by Dr. Dwain Sarna. PMH: afib, bacteremia, complete heart block, COPD, diabetes, HTN, PVD, shingles, permanent pacemaker insertion    PT Comments    Pt with slow progress.  More agreeable to therapy today but fatigues easily and needs encouragement/education to push self more after taking a rest break.  Continue plan of care.     Recommendations for follow up therapy are one component of a multi-disciplinary discharge planning process, led by the attending physician.  Recommendations may be updated based on patient status, additional functional criteria and insurance authorization.  Follow Up Recommendations  Can patient physically be transported by private vehicle: No    Assistance Recommended at Discharge Frequent or constant Supervision/Assistance  Patient can return home with the following A lot of help with walking and/or transfers;A lot of help with bathing/dressing/bathroom;Assistance with cooking/housework;Assist for transportation   Equipment Recommendations  None recommended by PT    Recommendations for Other Services       Precautions / Restrictions Precautions Precautions: Fall Restrictions Weight Bearing Restrictions: No     Mobility  Bed Mobility Overal bed mobility: Needs Assistance Bed Mobility: Supine to Sit     Supine to sit: Min assist, HOB elevated     General bed mobility comments: Increased time and cues for all sequencing.  Pt asking for help even before attempting - provided cues for sequencing and encouragement to attempt on his own.  Did require assist for trunk lift and scooting forward    Transfers Overall transfer level: Needs assistance Equipment  used: Rolling walker (2 wheels) Transfers: Sit to/from Stand Sit to Stand: Mod assist           General transfer comment: Performed x 3 during session with cues for hand placement and leaning forward to stand; cues for controlled descent    Ambulation/Gait Ambulation/Gait assistance: Min assist Gait Distance (Feet): 6 Feet (6'x2) Assistive device: Rolling walker (2 wheels) Gait Pattern/deviations: Step-to pattern, Decreased stride length, Trunk flexed Gait velocity: decreased     General Gait Details: 6'x2 with rest break.  Only agreed to short distance; Fatigued easily, cues for posture with assist for balance and RW managment   Stairs             Wheelchair Mobility    Modified Rankin (Stroke Patients Only)       Balance Overall balance assessment: Needs assistance Sitting-balance support: Feet supported, No upper extremity supported Sitting balance-Leahy Scale: Good     Standing balance support: Reliant on assistive device for balance, During functional activity, Bilateral upper extremity supported Standing balance-Leahy Scale: Poor                              Cognition Arousal/Alertness: Awake/alert Behavior During Therapy: WFL for tasks assessed/performed Overall Cognitive Status: Within Functional Limits for tasks assessed                                 General Comments: Pleasant, WFL for basic tasks though does appear to lack motivation to progress        Exercises Other Exercises Other Exercises: IS to 500 mL x 5  and flutter x 5 with cuees for correct use and breaks between each trial Other Exercises: Quad set x 5 bil with HS stretch (pillow under ankle)    General Comments General comments (skin integrity, edema, etc.): VSS      Pertinent Vitals/Pain Pain Assessment Pain Assessment: No/denies pain    Home Living                          Prior Function            PT Goals (current goals can now  be found in the care plan section) Progress towards PT goals: Progressing toward goals    Frequency    Min 2X/week      PT Plan Current plan remains appropriate    Co-evaluation              AM-PAC PT "6 Clicks" Mobility   Outcome Measure  Help needed turning from your back to your side while in a flat bed without using bedrails?: A Lot Help needed moving from lying on your back to sitting on the side of a flat bed without using bedrails?: A Lot Help needed moving to and from a bed to a chair (including a wheelchair)?: A Lot Help needed standing up from a chair using your arms (e.g., wheelchair or bedside chair)?: A Lot Help needed to walk in hospital room?: Total Help needed climbing 3-5 steps with a railing? : Total 6 Click Score: 10    End of Session Equipment Utilized During Treatment: Gait belt Activity Tolerance: Patient tolerated treatment well Patient left: with call bell/phone within reach;with family/visitor present;with chair alarm set;in chair Nurse Communication: Mobility status PT Visit Diagnosis: Other abnormalities of gait and mobility (R26.89)     Time: 6644-0347 PT Time Calculation (min) (ACUTE ONLY): 19 min  Charges:  $Therapeutic Activity: 8-22 mins                     Anise Salvo, PT Acute Rehab [facility] [phone]    Rayetta Humphrey 09/02/2022, 2:15 PM

## 2022-09-02 NOTE — Plan of Care (Signed)
  Problem: Education: Goal: Knowledge of General Education information will improve Description: Including pain rating scale, medication(s)/side effects and non-pharmacologic comfort measures Outcome: Progressing   Problem: Health Behavior/Discharge Planning: Goal: Ability to manage health-related needs will improve Outcome: Progressing   Problem: Clinical Measurements: Goal: Ability to maintain clinical measurements within normal limits will improve Outcome: Progressing Goal: Respiratory complications will improve Outcome: Progressing   Problem: Safety: Goal: Ability to remain free from injury will improve Outcome: Progressing   Problem: Skin Integrity: Goal: Risk for impaired skin integrity will decrease Outcome: Progressing

## 2022-09-03 DIAGNOSIS — I5022 Chronic systolic (congestive) heart failure: Secondary | ICD-10-CM | POA: Diagnosis not present

## 2022-09-03 DIAGNOSIS — K625 Hemorrhage of anus and rectum: Secondary | ICD-10-CM | POA: Diagnosis not present

## 2022-09-03 DIAGNOSIS — K611 Rectal abscess: Secondary | ICD-10-CM | POA: Diagnosis not present

## 2022-09-03 LAB — BASIC METABOLIC PANEL
Anion gap: 11 (ref 5–15)
BUN: 38 mg/dL — ABNORMAL HIGH (ref 8–23)
CO2: 24 mmol/L (ref 22–32)
Calcium: 8.5 mg/dL — ABNORMAL LOW (ref 8.9–10.3)
Chloride: 101 mmol/L (ref 98–111)
Creatinine, Ser: 1.75 mg/dL — ABNORMAL HIGH (ref 0.61–1.24)
GFR, Estimated: 37 mL/min — ABNORMAL LOW (ref >60)
Glucose, Bld: 115 mg/dL — ABNORMAL HIGH (ref 70–99)
Potassium: 3.9 mmol/L (ref 3.5–5.1)
Sodium: 136 mmol/L (ref 135–145)

## 2022-09-03 LAB — MAGNESIUM: Magnesium: 2.6 mg/dL — ABNORMAL HIGH (ref 1.7–2.4)

## 2022-09-03 MED ORDER — LEVOTHYROXINE SODIUM 50 MCG PO TABS: 50.0000 ug | ORAL_TABLET | Freq: Every day | ORAL | Status: DC

## 2022-09-03 MED ORDER — OMEPRAZOLE 20 MG PO CPDR: 20.0000 mg | DELAYED_RELEASE_CAPSULE | Freq: Every day | ORAL | Status: DC

## 2022-09-03 MED ORDER — FUROSEMIDE 40 MG PO TABS: 20.0000 mg | ORAL_TABLET | Freq: Every day | ORAL | Status: DC

## 2022-09-03 MED ORDER — GUAIFENESIN ER 600 MG PO TB12: 1200.0000 mg | ORAL_TABLET | Freq: Two times a day (BID) | ORAL | 0 refills | Status: DC

## 2022-09-03 NOTE — Discharge Summary (Signed)
Physician Discharge Summary  Daniel Woodard HYI:[id number] DOB: 1929/11/24 DOA: 08/22/2022  PCP: [name], MD  Admit date: 08/22/2022 Discharge date: 09/03/2022  Admitted From: Home Disposition: SNF  Recommendations for Outpatient Follow-up:  Follow up with SNF provider at earliest convenience Outpatient follow-up with general surgery  follow up in ED if symptoms worsen or new appear   Home Health: No Equipment/Devices: None  Discharge Condition: Guarded CODE STATUS: DNR Diet recommendation: Heart healthy/fluid restriction of up to 1200 cc a day  Brief/Interim Summary: 89 yo male with pmh of allergic rhinitis, atrial fibrillation not on anticoagulation MAC bacteremia, BPH, carotid artery occlusion, complete heart block status post permanent pacemaker placement, COPD, depression, type 2 diabetes, hyperlipidemia, hypertension, PVD, perirectal abscess in January 2023 requiring an incision and drainage presented with rectal bleeding.  On presentation, CTA negative for GI bleed but showed findings concerning for perirectal abscess with an associated rim-enhancing fistula from the left side of the low rectum/anus down to the left skin surface of the intergluteal fold.  He was started on broad-spectrum antibiotics.  Patient underwent flexible sigmoidoscopy by GI on 08/23/2022 without clear source of bleeding; bloody drainage noted from fistula tract.  Patient underwent EUA with I&D of small recurrent perirectal abscess by general surgery on 08/24/2022.  Subsequently antibiotics switched to Augmentin and has completed antibiotic treatment.  PT recommending SNF placement.  He will be discharged to SNF once bed is available  Discharge Diagnoses:   Concern for perianal fistula with active bleeding/recurrent perirectal abscess -underwent flexible sigmoidoscopy by GI on 08/23/2022 without clear source of bleeding; bloody drainage noted from fistula tract.  Patient underwent EUA with I&D of small  recurrent perirectal abscess by general surgery on 08/24/2022.  Subsequently antibiotics switched to Augmentin and has completed antibiotic treatment. -Outpatient follow-up with general surgery. -Discharge to SNF once bed is available   Acute on chronic systolic heart failure Acute respiratory failure with hypoxia -Treated with IV Lasix 20 mg twice a day for few days with good diuresis.  Lasix held on 09/02/2022 because of increasing creatinine.  Continue to hold Lasix and possibly start low-dose of Lasix 20 mg daily from 09/05/2022 onwards at [facility].  Currently on room air. -Continue fluid and diet restriction on discharge.    AKI on CKD stage IIIb -Creatinine bumped up to 1.84 on 09/02/2022. Creatinine improving to 1.75 today. Diuretics plan as above.  Outpatient follow-up of BMP XLIF in the next few days   hypertension -Blood pressure currently stable.  Continue metoprolol   CAD History of complete heart block status post pacemaker placement Hyperlipidemia -Continue beta-blocker.  Resume statin.  Outpatient follow-up with cardiology.   Physical deconditioning -Will need SNF placement.   Hypothyroidism -Continue levothyroxine   Discharge Instructions   Allergies as of 09/03/2022       Reactions   Ace Inhibitors Cough      Tape Other (See Comments)   SKIN IS THIN AND BRUISES VERY EASILY!!   Crestor [rosuvastatin] Other (See Comments)   Causes the joints to hurt        Medication List     STOP taking these medications    QUERCETIN PO   traZODone 50 MG tablet Commonly known as: DESYREL   vitamin k 100 MCG tablet       TAKE these medications    albuterol 108 (90 Base) MCG/ACT inhaler Commonly known as: VENTOLIN HFA Inhale 2 puffs into the lungs every 6 (six) hours as needed for wheezing or shortness  of breath.   Coenzyme Q10 200 MG capsule Take 200 mg by mouth daily.   empagliflozin 10 MG Tabs tablet Commonly known as: Jardiance Take 1 tablet (10 mg total) by  mouth daily before breakfast.   furosemide 40 MG tablet Commonly known as: LASIX Take 0.5 tablets (20 mg total) by mouth daily. Start taking on: September 05, 2022 What changed:  how much to take These instructions start on September 05, 2022. If you are unsure what to do until then, ask your doctor or other care provider.   guaiFENesin 600 MG 12 hr tablet Commonly known as: MUCINEX Take 2 tablets (1,200 mg total) by mouth 2 (two) times daily.   levothyroxine 50 MCG tablet Commonly known as: SYNTHROID Take 1 tablet (50 mcg total) by mouth daily before breakfast.   Magnesium 400 MG Tabs Take 400 mg by mouth every evening.   metoprolol succinate 25 MG 24 hr tablet Commonly known as: TOPROL-XL Take 1/2 (one-half) tablet by mouth once daily What changed: See the new instructions.   omeprazole 20 MG capsule Commonly known as: PRILOSEC Take 1 capsule (20 mg total) by mouth daily before breakfast. TAKE 1 CAPSULE BY MOUTH ONCE DAILY   potassium chloride SA 20 MEQ tablet Commonly known as: KLOR-CON M Take 20 mEq by mouth daily.   rosuvastatin 20 MG tablet Commonly known as: CRESTOR Take 1 tablet (20 mg total) by mouth at bedtime. What changed:  when to take this additional instructions   TYLENOL 500 MG tablet Generic drug: acetaminophen Take 500 mg by mouth every 6 (six) hours as needed for mild pain or headache.   VITAMIN B-12 PO Take 1 tablet by mouth daily.   Vitamin D3 125 MCG (5000 UT) Tabs Generic drug: Cholecalciferol Take 5,000 Units by mouth daily.   zinc gluconate 50 MG tablet Take 50 mg by mouth 2 (two) times daily.        Contact information for follow-up providers     Maczis, Hedda Slade, PA-C Follow up.   Specialty: General Surgery Why: our office is scheduling you for post-operative follow up for a wound check. please call to confirm appointment date/time. Contact information: [address] [place] [address] [phone]               Contact information for after-discharge care     Destination     [facility] .   Service: Skilled Nursing Contact information: [address] [place] [address] [phone]                    Allergies  Allergen Reactions   Ace Inhibitors Cough        Tape Other (See Comments)    SKIN IS THIN AND BRUISES VERY EASILY!!   Crestor [Rosuvastatin] Other (See Comments)    Causes the joints to hurt    Consultations: General surgery   Procedures/Studies: DG CHEST PORT 1 VIEW  Result Date: 08/29/2022 CLINICAL DATA:  Shortness of breath EXAM: PORTABLE CHEST 1 VIEW COMPARISON:  Portable exam 1219 hours compared to 08/22/2022 FINDINGS: LEFT subclavian pacemaker with leads projecting over RIGHT atrium, RIGHT ventricle, and coronary sinus. Enlargement of cardiac silhouette with pulmonary vascular congestion. Atherosclerotic calcification aorta. Bibasilar effusions and atelectasis, slightly increased. Hazy interstitial infiltrates favoring pulmonary edema, increased. No pneumothorax or acute osseous findings. IMPRESSION: Increased pulmonary infiltrates question pulmonary edema. Bibasilar effusions and atelectasis, increased. Aortic Atherosclerosis (ICD10-I70.0). Electronically Signed   By:  Ulyses SouthwardMark  Boles M.D.   On: 08/29/2022 12:30   DG CHEST PORT 1 VIEW  Result Date: 08/22/2022 CLINICAL DATA:  Pleural effusion, atelectasis. EXAM: PORTABLE CHEST 1 VIEW COMPARISON:  06/07/2021. FINDINGS: Left chest biventricular pacemaker with leads projecting over the right atrium, right ventricle and coronary sinus. Small left and trace right pleural effusions with adjacent atelectasis in the lung bases, left-greater-than-right. Stable cardiac and mediastinal contours. No pneumothorax. IMPRESSION: Small left and trace right pleural effusions with adjacent atelectasis in the lung bases, left-greater-than-right. Electronically Signed   By: Orvan FalconerWalter  Wiggins M.D.   On:  08/22/2022 12:17   CT ANGIO GI BLEED  Result Date: 08/22/2022 CLINICAL DATA:  Lower GI bleed.  Rectal bleeding. EXAM: CTA ABDOMEN AND PELVIS WITHOUT AND WITH CONTRAST TECHNIQUE: Multidetector CT imaging of the abdomen and pelvis was performed using the standard protocol during bolus administration of intravenous contrast. Multiplanar reconstructed images and MIPs were obtained and reviewed to evaluate the vascular anatomy. RADIATION DOSE REDUCTION: This exam was performed according to the departmental dose-optimization program which includes automated exposure control, adjustment of the mA and/or kV according to patient size and/or use of iterative reconstruction technique. CONTRAST:  80mL OMNIPAQUE IOHEXOL 350 MG/ML SOLN COMPARISON:  Abdomen and pelvis CT 06/07/2021 FINDINGS: VASCULAR Aorta: Normal caliber aorta without aneurysm, dissection, vasculitis or significant stenosis. Tiny 4-5 mm ulcerated plaque or penetrating ulcer noted anterior wall abdominal aorta just inferior to the IMA origin (axial 116/5). Prominent atherosclerotic calcification and mural thrombus noted. Celiac: Patent without evidence of aneurysm, dissection, vasculitis or significant stenosis. SMA: Prominent calcific plaque at the ostium with probable flow limiting stenosis (see axial 75/5 and coronal 59/12). Renals: Prominent calcific plaque at the proximal renal arteries bilaterally without definite features of flow limiting stenosis. IMA: Patent without evidence of aneurysm, dissection, vasculitis or significant stenosis. Inflow: Patent without evidence of aneurysm, dissection, vasculitis or significant stenosis. Proximal Outflow: Choose 1 Veins: No obvious venous abnormality within the limitations of this arterial phase study. Review of the MIP images confirms the above findings. NON-VASCULAR Lower chest: Left lower lobe collapse noted with small to moderate bilateral pleural effusions, left greater than right. Hepatobiliary: Subtle  nodularity of liver contour raises the question of cirrhosis. No evidence for focal arterial phase hyperenhancement within the parenchyma. There is no evidence for gallstones, gallbladder wall thickening, or pericholecystic fluid. No intrahepatic or extrahepatic biliary dilation. Pancreas: No focal mass lesion. No dilatation of the main duct. No intraparenchymal cyst. No peripancreatic edema. Spleen: No splenomegaly. No focal mass lesion. Adrenals/Urinary Tract: Bilateral adrenal thickening without a discrete nodule or mass. Left kidney unremarkable. Tiny hypodensity in the interpolar right kidney is likely a simple cyst. No followup imaging is recommended. No evidence for hydroureter. The urinary bladder appears normal for the degree of distention. Stomach/Bowel: Unremarkable. No evidence for arterial phase contrast extravasation into the gastric lumen. No evidence for active hemorrhage into the duodenum. No findings to suggest arterial phase extravasation of contrast in the small bowel or colon. Advanced diverticular disease is noted in the sigmoid colon with inspissated high density material in numerous sigmoid colon diverticuli. A thin collar of fluid density measuring only a bowel 5 mm in diameter with peripheral rim enhancement is seen posterior to the low rectum/anus tracking anteriorly on both sides. This is associated with a rim enhancing fluid collection that tracks caudally in the left ischial anal fat to the left skin surface of the intergluteal fold (see images 89-96 of series 7 and coronal images  82-104 of series 15). Lymphatic: There is no gastrohepatic or hepatoduodenal ligament lymphadenopathy. No retroperitoneal or mesenteric lymphadenopathy. No pelvic sidewall lymphadenopathy. Reproductive: The prostate gland and seminal vesicles are unremarkable. Other: No intraperitoneal free fluid. Musculoskeletal: Diffuse body wall edema evident. No worrisome lytic or sclerotic osseous abnormality. IMPRESSION:  1. No evidence for active arterial phase contrast extravasation into the gastric lumen, duodenum, small bowel, or colon. 2. There is a thin U shaped collar of fluid density with peripheral rim enhancement (perirectal abscess) posterior to and abutting the low rectum/anus over about 180 degree arc with an associated rim enhancing fistula from the left side of the low rectum/anus down to the left skin surface of the intergluteal fold. 3. Prominent calcific plaque at the ostium of the SMA with probable flow limiting stenosis. 4. Tiny 4-5 mm ulcerated plaque or penetrating ulcer anterior wall abdominal aorta just inferior to the IMA origin. 5. Advanced sigmoid diverticular disease with inspissated high density material in numerous sigmoid colon diverticuli. 6. Left lower lobe collapse with small to moderate bilateral pleural effusions, left greater than right. 7. Subtle nodularity of liver contour raises the question of cirrhosis. 8. Diffuse body wall edema. 9.  Aortic Atherosclerosis (ICD10-I70.0). Electronically Signed   By: Kennith Center M.D.   On: 08/22/2022 06:59   CUP PACEART REMOTE DEVICE CHECK  Result Date: 08/12/2022 Scheduled remote reviewed. Normal device function.  Alert status for HF, optivol crossed threshold 10/29 and is ongoing Next remote 91 days. LA  IR Radiologist Eval & Mgmt  Result Date: 08/05/2022 EXAM: NEW PATIENT OFFICE VISIT CHIEF COMPLAINT: Electronic medical record HISTORY OF PRESENT ILLNESS: Electronic medical record REVIEW OF SYSTEMS: Electronic medical record PHYSICAL EXAMINATION: Electronic medical record ASSESSMENT AND PLAN: Electronic medical record Electronically Signed   By: Gilmer Mor D.O.   On: 08/05/2022 16:14      Subjective: Patient seen and made at bedside.  Slow to respond, poor historian.  Denies worsening chest pain, fever, vomiting.  Discharge Exam: Vitals:   09/02/22 2054 09/03/22 0438  BP: 90/70 94/70  Pulse: 88 88  Resp:    Temp: 97.7 F (36.5 C)  (!) 97.5 F (36.4 C)  SpO2: 92% 91%    General: Pt is alert, awake, not in acute distress.  Slow to respond.  Poor historian.  On room air currently.  Flat affect. Cardiovascular: rate controlled, S1/S2 + Respiratory: bilateral decreased breath sounds at bases with some scattered crackles Abdominal: Soft, NT, ND, bowel sounds + Extremities: Trace lower extremity edema; no cyanosis    The results of significant diagnostics from this hospitalization (including imaging, microbiology, ancillary and laboratory) are listed below for reference.     Microbiology: No results found for this or any previous visit (from the past 240 hour(s)).   Labs: BNP (last 3 results) Recent Labs    06/30/22 1120 08/30/22 0524 08/31/22 0752  BNP 1,839.8* 2,460.3* 1,909.4*   Basic Metabolic Panel: Recent Labs  Lab 08/30/22 0524 08/31/22 0512 09/02/22 0519 09/03/22 0554  NA 141 137 136 136  K 3.8 3.8 4.0 3.9  CL 106 102 102 101  CO2 GLUCOSE 91 89 104* 115*  BUN 27* 27* 37* 38*  CREATININE 1.53* 1.47* 1.84* 1.75*  CALCIUM 8.7* 8.3* 8.5* 8.5*  MG 2.4 2.2 2.5* 2.6*   Liver Function Tests: No results for input(s): "AST", "ALT", "ALKPHOS", "BILITOT", "PROT", "ALBUMIN" in the last 168 hours. No results for input(s): "LIPASE", "AMYLASE" in the last 168 hours.  No results for input(s): "AMMONIA" in the last 168 hours. CBC: Recent Labs  Lab 08/30/22 0524 08/31/22 0512  WBC 6.5 5.8  NEUTROABS 4.9 4.2  HGB 14.4 14.3  HCT 48.3 48.3  MCV 82.7 82.7  PLT 175 164   Cardiac Enzymes: No results for input(s): "CKTOTAL", "CKMB", "CKMBINDEX", "TROPONINI" in the last 168 hours. BNP: Invalid input(s): "POCBNP" CBG: Recent Labs  Lab 08/31/22 1128 08/31/22 1716 08/31/22 2046 09/01/22 0726 09/01/22 1124  GLUCAP 122* 106* 114* 87 106*   D-Dimer No results for input(s): "DDIMER" in the last 72 hours. Hgb A1c No results for input(s): "HGBA1C" in the last 72 hours. Lipid Profile No  results for input(s): "CHOL", "HDL", "LDLCALC", "TRIG", "CHOLHDL", "LDLDIRECT" in the last 72 hours. Thyroid function studies No results for input(s): "TSH", "T4TOTAL", "T3FREE", "THYROIDAB" in the last 72 hours.  Invalid input(s): "FREET3" Anemia work up No results for input(s): "VITAMINB12", "FOLATE", "FERRITIN", "TIBC", "IRON", "RETICCTPCT" in the last 72 hours. Urinalysis    Component Value Date/Time   COLORURINE YELLOW 08/02/2022 0922   APPEARANCEUR CLEAR 08/02/2022 0922   LABSPEC 1.025 08/02/2022 0922   PHURINE 5.5 08/02/2022 0922   GLUCOSEU >=1000 (A) 08/02/2022 0922   HGBUR NEGATIVE 08/02/2022 0922   BILIRUBINUR NEGATIVE 08/02/2022 0922   KETONESUR NEGATIVE 08/02/2022 0922   PROTEINUR NEGATIVE 06/07/2021 1100   UROBILINOGEN 0.2 08/02/2022 0922   NITRITE NEGATIVE 08/02/2022 0922   LEUKOCYTESUR NEGATIVE 08/02/2022 0922   Sepsis Labs Recent Labs  Lab 08/30/22 0524 08/31/22 0512  WBC 6.5 5.8   Microbiology No results found for this or any previous visit (from the past 240 hour(s)).   Time coordinating discharge: 35 minutes  SIGNED:   Glade Lloyd, MD  [facility] 09/03/2022, 8:29 AM

## 2022-09-03 NOTE — TOC Transition Note (Addendum)
Transition of Care [facility]) - CM/SW Discharge Note   Patient Details  Name: Daniel Woodard MRN: [id number] Date of Birth: Jan 28, 1930  [facility] CM/SW Contact:  Adrian Prows, RN Phone Number: 09/03/2022, 11:02 AM   Clinical Narrative:    D/C orders received for pt; d/c summary and SNF report sent via SNF hub; spoke w/ Alinda Money at [facility]; she gave RM # [phone], and call report # [phone]; pt's dtr Sabas Sous notified and agrees to d/c plan; [facility] called at [phone]; spoke w/ operator # [phone]; no TOC needs.   Final next level of care: Skilled Nursing Facility Barriers to Discharge: No Barriers Identified   Patient Goals and CMS Choice CMS Medicare.gov Compare Post Acute Care list provided to:: Patient Represenative (must comment) Choice offered to / list presented to : Adult Children  Discharge Placement                Patient chooses bed at: [facility] Patient to be transferred to facility by: [facility] Name of family member notified: Karin Lieu (dtr) [phone] Patient and family notified of of transfer: 09/03/22  Discharge Plan and Services Additional resources added to the After Visit Summary for     Discharge Planning Services: CM Consult Post Acute Care Choice: Skilled Nursing Facility                               Social Determinants of Health (SDOH) Interventions SDOH Screenings   Food Insecurity: No Food Insecurity (08/22/2022)  Housing: Low Risk  (08/22/2022)  Transportation Needs: No Transportation Needs (08/22/2022)  Utilities: Not At Risk (08/22/2022)  Alcohol Screen: Low Risk  (02/25/2022)  Depression (PHQ2-9): High Risk (08/02/2022)  Financial Resource Strain: Low Risk  (02/25/2022)  Physical Activity: Inactive (02/25/2022)  Social Connections: Unknown (02/25/2022)  Stress: No Stress Concern Present (02/25/2022)  Tobacco Use: Medium Risk (08/25/2022)     Readmission Risk Interventions     No data to display

## 2022-09-12 ENCOUNTER — Telehealth: Payer: Self-pay | Admitting: Internal Medicine

## 2022-09-12 NOTE — Telephone Encounter (Signed)
Routing to others to advise of patients request to inform.

## 2022-09-12 NOTE — Progress Notes (Signed)
Remote pacemaker transmission.   

## 2022-09-12 NOTE — Telephone Encounter (Signed)
Patient's daughter called in, pt is in hospice care and they no longer want him to be followed. Please advise

## 2022-09-26 ENCOUNTER — Ambulatory Visit: Payer: Medicare Other | Admitting: Cardiovascular Disease

## 2022-09-28 NOTE — Telephone Encounter (Signed)
Agree with the patient's family request. No additional remotes are indicated. GT

## 2022-09-28 DEATH — deceased

## 2022-10-13 ENCOUNTER — Ambulatory Visit: Payer: Medicare Other | Admitting: Internal Medicine

## 2022-10-14 ENCOUNTER — Ambulatory Visit: Payer: Medicare Other | Admitting: Podiatry

## 2022-11-11 ENCOUNTER — Ambulatory Visit: Payer: Medicare Other

## 2022-12-15 ENCOUNTER — Ambulatory Visit: Payer: Medicare Other | Admitting: Internal Medicine

## 2023-01-23 ENCOUNTER — Ambulatory Visit: Payer: Medicare Other | Admitting: Internal Medicine

## 2023-02-10 ENCOUNTER — Ambulatory Visit: Payer: Medicare Other

## 2023-05-12 ENCOUNTER — Ambulatory Visit: Payer: Medicare Other

## 2023-08-11 ENCOUNTER — Ambulatory Visit: Payer: Medicare Other

## 2023-11-10 ENCOUNTER — Ambulatory Visit: Payer: Medicare Other

## 2024-02-09 ENCOUNTER — Ambulatory Visit: Payer: Medicare Other

## 2024-05-10 ENCOUNTER — Ambulatory Visit: Payer: Medicare Other
